# Patient Record
Sex: Female | Born: 1935 | Race: White | Hispanic: No | Smoking: Never smoker
Health system: Southern US, Community
[De-identification: ages and names within clinical notes are randomized; demographics above are authoritative.]

## PROBLEM LIST (undated history)

## (undated) DIAGNOSIS — C9590 Leukemia, unspecified not having achieved remission: Secondary | ICD-10-CM

## (undated) DIAGNOSIS — K5730 Diverticulosis of large intestine without perforation or abscess without bleeding: Secondary | ICD-10-CM

## (undated) DIAGNOSIS — Z860100 Personal history of colon polyps, unspecified: Secondary | ICD-10-CM

## (undated) DIAGNOSIS — J322 Chronic ethmoidal sinusitis: Secondary | ICD-10-CM

## (undated) DIAGNOSIS — D801 Nonfamilial hypogammaglobulinemia: Secondary | ICD-10-CM

## (undated) DIAGNOSIS — G629 Polyneuropathy, unspecified: Secondary | ICD-10-CM

## (undated) DIAGNOSIS — K219 Gastro-esophageal reflux disease without esophagitis: Secondary | ICD-10-CM

## (undated) DIAGNOSIS — T7840XA Allergy, unspecified, initial encounter: Secondary | ICD-10-CM

## (undated) DIAGNOSIS — K5790 Diverticulosis of intestine, part unspecified, without perforation or abscess without bleeding: Secondary | ICD-10-CM

## (undated) DIAGNOSIS — N390 Urinary tract infection, site not specified: Secondary | ICD-10-CM

## (undated) DIAGNOSIS — E785 Hyperlipidemia, unspecified: Secondary | ICD-10-CM

## (undated) DIAGNOSIS — M1990 Unspecified osteoarthritis, unspecified site: Secondary | ICD-10-CM

## (undated) DIAGNOSIS — I10 Essential (primary) hypertension: Secondary | ICD-10-CM

## (undated) DIAGNOSIS — D510 Vitamin B12 deficiency anemia due to intrinsic factor deficiency: Secondary | ICD-10-CM

## (undated) DIAGNOSIS — E538 Deficiency of other specified B group vitamins: Secondary | ICD-10-CM

## (undated) HISTORY — DX: Urinary tract infection, site not specified: N39.0

## (undated) HISTORY — DX: Hyperlipidemia, unspecified: E78.5

## (undated) HISTORY — DX: Personal history of colon polyps, unspecified: Z86.0100

## (undated) HISTORY — DX: Nonfamilial hypogammaglobulinemia: D80.1

## (undated) HISTORY — PX: TUBAL LIGATION: SHX77

## (undated) HISTORY — DX: Essential (primary) hypertension: I10

## (undated) HISTORY — DX: Deficiency of other specified B group vitamins: E53.8

## (undated) HISTORY — DX: Chronic ethmoidal sinusitis: J32.2

## (undated) HISTORY — DX: Polyneuropathy, unspecified: G62.9

## (undated) HISTORY — DX: Diverticulosis of intestine, part unspecified, without perforation or abscess without bleeding: K57.90

## (undated) HISTORY — DX: Diverticulosis of large intestine without perforation or abscess without bleeding: K57.30

## (undated) HISTORY — DX: Unspecified osteoarthritis, unspecified site: M19.90

## (undated) HISTORY — DX: Allergy, unspecified, initial encounter: T78.40XA

## (undated) HISTORY — DX: Personal history of colonic polyps: Z86.010

## (undated) HISTORY — DX: Vitamin B12 deficiency anemia due to intrinsic factor deficiency: D51.0

## (undated) HISTORY — DX: Leukemia, unspecified not having achieved remission: C95.90

## (undated) HISTORY — DX: Gastro-esophageal reflux disease without esophagitis: K21.9

---

## 1959-07-25 HISTORY — PX: OTHER SURGICAL HISTORY: SHX169

## 1985-07-24 HISTORY — PX: OTHER SURGICAL HISTORY: SHX169

## 1985-07-24 HISTORY — PX: ABDOMINAL HYSTERECTOMY: SHX81

## 1990-07-24 HISTORY — PX: OTHER SURGICAL HISTORY: SHX169

## 1997-07-24 HISTORY — PX: OTHER SURGICAL HISTORY: SHX169

## 1997-11-03 ENCOUNTER — Encounter: Admission: RE | Admit: 1997-11-03 | Discharge: 1998-02-01 | Payer: Self-pay | Admitting: Orthopedic Surgery

## 1998-12-01 ENCOUNTER — Ambulatory Visit (HOSPITAL_COMMUNITY): Admission: RE | Admit: 1998-12-01 | Discharge: 1998-12-01 | Payer: Self-pay | Admitting: Neurology

## 1998-12-01 ENCOUNTER — Encounter: Payer: Self-pay | Admitting: Neurology

## 1998-12-31 ENCOUNTER — Encounter: Admission: RE | Admit: 1998-12-31 | Discharge: 1999-02-21 | Payer: Self-pay | Admitting: Neurology

## 1999-02-17 ENCOUNTER — Encounter: Admission: RE | Admit: 1999-02-17 | Discharge: 1999-02-21 | Payer: Self-pay | Admitting: Neurology

## 1999-03-14 ENCOUNTER — Encounter: Admission: RE | Admit: 1999-03-14 | Discharge: 1999-06-12 | Payer: Self-pay | Admitting: Neurology

## 2000-01-10 ENCOUNTER — Encounter (INDEPENDENT_AMBULATORY_CARE_PROVIDER_SITE_OTHER): Payer: Self-pay

## 2000-01-10 ENCOUNTER — Other Ambulatory Visit: Admission: RE | Admit: 2000-01-10 | Discharge: 2000-01-10 | Payer: Self-pay | Admitting: Gastroenterology

## 2000-01-10 DIAGNOSIS — Z860100 Personal history of colon polyps, unspecified: Secondary | ICD-10-CM | POA: Insufficient documentation

## 2000-08-04 ENCOUNTER — Ambulatory Visit (HOSPITAL_COMMUNITY): Admission: RE | Admit: 2000-08-04 | Discharge: 2000-08-04 | Payer: Self-pay | Admitting: Neurology

## 2000-08-04 ENCOUNTER — Encounter: Payer: Self-pay | Admitting: Neurology

## 2000-08-16 ENCOUNTER — Encounter: Admission: RE | Admit: 2000-08-16 | Discharge: 2000-09-07 | Payer: Self-pay | Admitting: Neurology

## 2001-10-02 ENCOUNTER — Encounter: Payer: Self-pay | Admitting: Internal Medicine

## 2001-10-02 ENCOUNTER — Ambulatory Visit (HOSPITAL_COMMUNITY): Admission: RE | Admit: 2001-10-02 | Discharge: 2001-10-02 | Payer: Self-pay | Admitting: Internal Medicine

## 2001-12-27 ENCOUNTER — Encounter: Admission: RE | Admit: 2001-12-27 | Discharge: 2002-02-28 | Payer: Self-pay | Admitting: Neurology

## 2003-05-19 ENCOUNTER — Encounter: Payer: Self-pay | Admitting: Gastroenterology

## 2003-05-19 DIAGNOSIS — K5730 Diverticulosis of large intestine without perforation or abscess without bleeding: Secondary | ICD-10-CM | POA: Insufficient documentation

## 2004-05-31 ENCOUNTER — Ambulatory Visit: Payer: Self-pay | Admitting: Internal Medicine

## 2004-08-01 ENCOUNTER — Ambulatory Visit: Payer: Self-pay | Admitting: Endocrinology

## 2004-08-26 ENCOUNTER — Ambulatory Visit: Payer: Self-pay | Admitting: Internal Medicine

## 2004-09-14 ENCOUNTER — Ambulatory Visit: Payer: Self-pay | Admitting: Internal Medicine

## 2004-09-16 ENCOUNTER — Ambulatory Visit: Payer: Self-pay | Admitting: Internal Medicine

## 2004-09-21 ENCOUNTER — Ambulatory Visit: Payer: Self-pay | Admitting: Internal Medicine

## 2004-09-22 ENCOUNTER — Ambulatory Visit (HOSPITAL_COMMUNITY): Admission: RE | Admit: 2004-09-22 | Discharge: 2004-09-22 | Payer: Self-pay | Admitting: Internal Medicine

## 2004-09-30 ENCOUNTER — Ambulatory Visit: Payer: Self-pay

## 2004-10-24 ENCOUNTER — Ambulatory Visit: Payer: Self-pay | Admitting: Internal Medicine

## 2004-11-23 ENCOUNTER — Ambulatory Visit: Payer: Self-pay | Admitting: Internal Medicine

## 2004-12-21 ENCOUNTER — Ambulatory Visit: Payer: Self-pay | Admitting: Internal Medicine

## 2005-01-25 ENCOUNTER — Ambulatory Visit: Payer: Self-pay | Admitting: Internal Medicine

## 2005-02-09 ENCOUNTER — Ambulatory Visit: Payer: Self-pay | Admitting: Internal Medicine

## 2005-03-15 ENCOUNTER — Ambulatory Visit: Payer: Self-pay | Admitting: Internal Medicine

## 2005-03-24 ENCOUNTER — Other Ambulatory Visit: Admission: RE | Admit: 2005-03-24 | Discharge: 2005-03-24 | Payer: Self-pay | Admitting: Obstetrics and Gynecology

## 2005-04-12 ENCOUNTER — Ambulatory Visit: Payer: Self-pay | Admitting: Internal Medicine

## 2005-05-10 ENCOUNTER — Ambulatory Visit: Payer: Self-pay | Admitting: Internal Medicine

## 2005-05-17 ENCOUNTER — Ambulatory Visit: Payer: Self-pay | Admitting: Internal Medicine

## 2005-06-07 ENCOUNTER — Ambulatory Visit: Payer: Self-pay | Admitting: Internal Medicine

## 2005-07-05 ENCOUNTER — Ambulatory Visit: Payer: Self-pay | Admitting: Internal Medicine

## 2005-07-24 HISTORY — PX: OTHER SURGICAL HISTORY: SHX169

## 2005-08-02 ENCOUNTER — Ambulatory Visit: Payer: Self-pay | Admitting: Internal Medicine

## 2005-08-18 ENCOUNTER — Ambulatory Visit: Payer: Self-pay | Admitting: Internal Medicine

## 2005-09-06 ENCOUNTER — Ambulatory Visit: Payer: Self-pay | Admitting: Internal Medicine

## 2005-09-25 ENCOUNTER — Ambulatory Visit: Payer: Self-pay | Admitting: Internal Medicine

## 2005-10-09 ENCOUNTER — Ambulatory Visit: Payer: Self-pay | Admitting: Internal Medicine

## 2005-10-17 ENCOUNTER — Ambulatory Visit: Payer: Self-pay | Admitting: Internal Medicine

## 2005-11-01 ENCOUNTER — Ambulatory Visit: Payer: Self-pay | Admitting: Internal Medicine

## 2005-11-27 ENCOUNTER — Ambulatory Visit: Payer: Self-pay | Admitting: Internal Medicine

## 2005-12-13 ENCOUNTER — Ambulatory Visit: Payer: Self-pay | Admitting: Internal Medicine

## 2005-12-25 ENCOUNTER — Ambulatory Visit: Payer: Self-pay | Admitting: Internal Medicine

## 2006-01-22 ENCOUNTER — Ambulatory Visit: Payer: Self-pay | Admitting: Internal Medicine

## 2006-03-02 ENCOUNTER — Ambulatory Visit: Payer: Self-pay | Admitting: Internal Medicine

## 2006-03-16 ENCOUNTER — Ambulatory Visit: Payer: Self-pay | Admitting: Internal Medicine

## 2006-04-04 ENCOUNTER — Ambulatory Visit: Payer: Self-pay | Admitting: Internal Medicine

## 2006-04-06 ENCOUNTER — Ambulatory Visit: Payer: Self-pay | Admitting: Internal Medicine

## 2006-04-30 ENCOUNTER — Ambulatory Visit: Payer: Self-pay | Admitting: Internal Medicine

## 2006-07-04 ENCOUNTER — Ambulatory Visit: Payer: Self-pay | Admitting: Internal Medicine

## 2006-07-26 ENCOUNTER — Ambulatory Visit: Payer: Self-pay | Admitting: Internal Medicine

## 2006-08-01 ENCOUNTER — Ambulatory Visit: Payer: Self-pay | Admitting: Internal Medicine

## 2006-08-29 ENCOUNTER — Ambulatory Visit: Payer: Self-pay | Admitting: Internal Medicine

## 2006-08-31 ENCOUNTER — Ambulatory Visit (HOSPITAL_COMMUNITY): Admission: RE | Admit: 2006-08-31 | Discharge: 2006-08-31 | Payer: Self-pay | Admitting: Neurology

## 2006-09-13 ENCOUNTER — Encounter: Admission: RE | Admit: 2006-09-13 | Discharge: 2006-09-13 | Payer: Self-pay | Admitting: Neurology

## 2006-09-26 ENCOUNTER — Ambulatory Visit: Payer: Self-pay | Admitting: Endocrinology

## 2006-10-09 ENCOUNTER — Ambulatory Visit: Payer: Self-pay | Admitting: Internal Medicine

## 2006-10-17 ENCOUNTER — Encounter: Admission: RE | Admit: 2006-10-17 | Discharge: 2006-11-16 | Payer: Self-pay | Admitting: Neurology

## 2006-11-07 ENCOUNTER — Ambulatory Visit: Payer: Self-pay | Admitting: Internal Medicine

## 2006-11-28 ENCOUNTER — Ambulatory Visit: Payer: Self-pay | Admitting: Internal Medicine

## 2006-12-19 ENCOUNTER — Encounter: Payer: Self-pay | Admitting: Endocrinology

## 2006-12-19 DIAGNOSIS — I10 Essential (primary) hypertension: Secondary | ICD-10-CM

## 2006-12-19 DIAGNOSIS — E785 Hyperlipidemia, unspecified: Secondary | ICD-10-CM

## 2006-12-26 ENCOUNTER — Ambulatory Visit: Payer: Self-pay | Admitting: Internal Medicine

## 2007-01-23 ENCOUNTER — Ambulatory Visit: Payer: Self-pay | Admitting: Internal Medicine

## 2007-01-24 ENCOUNTER — Ambulatory Visit: Payer: Self-pay | Admitting: Internal Medicine

## 2007-01-24 LAB — CONVERTED CEMR LAB
ALT: 18 units/L (ref 0–35)
AST: 23 units/L (ref 0–37)
BUN: 7 mg/dL (ref 6–23)
Basophils Relative: 0 % (ref 0.0–1.0)
Bilirubin, Direct: 0.1 mg/dL (ref 0.0–0.3)
Calcium: 9.8 mg/dL (ref 8.4–10.5)
Chloride: 103 meq/L (ref 96–112)
Cholesterol: 159 mg/dL (ref 0–200)
Eosinophils Absolute: 0.1 10*3/uL (ref 0.0–0.6)
GFR calc non Af Amer: 74 mL/min
Glucose, Bld: 107 mg/dL — ABNORMAL HIGH (ref 70–99)
HCT: 43 % (ref 36.0–46.0)
HDL: 60.7 mg/dL (ref >39.0)
Hemoglobin, Urine: NEGATIVE
Lymphocytes Relative: 61.7 % — ABNORMAL HIGH (ref 12.0–46.0)
Monocytes Absolute: 0.3 10*3/uL (ref 0.2–0.7)
Monocytes Relative: 4.8 % (ref 3.0–11.0)
Neutro Abs: 2.2 10*3/uL (ref 1.4–7.7)
Neutrophils Relative %: 32.3 % — ABNORMAL LOW (ref 43.0–77.0)
Nitrite: NEGATIVE
Platelets: 141 10*3/uL — ABNORMAL LOW (ref 150–400)
Potassium: 3.2 meq/L — ABNORMAL LOW (ref 3.5–5.1)
RBC: 4.64 M/uL (ref 3.87–5.11)
Sed Rate: 9 mm/hr (ref 0–25)
TSH: 4.07 microintl units/mL (ref 0.35–5.50)
Total Bilirubin: 1 mg/dL (ref 0.3–1.2)
Triglycerides: 69 mg/dL (ref 0–149)
Urobilinogen, UA: 0.2 (ref 0.0–1.0)
VLDL: 14 mg/dL (ref 0–40)
WBC: 6.8 10*3/uL (ref 4.5–10.5)
pH: 7.5 (ref 5.0–8.0)

## 2007-02-21 ENCOUNTER — Ambulatory Visit: Payer: Self-pay | Admitting: Internal Medicine

## 2007-02-22 ENCOUNTER — Encounter: Admission: RE | Admit: 2007-02-22 | Discharge: 2007-05-23 | Payer: Self-pay | Admitting: Orthopedic Surgery

## 2007-03-03 ENCOUNTER — Emergency Department (HOSPITAL_COMMUNITY): Admission: EM | Admit: 2007-03-03 | Discharge: 2007-03-03 | Payer: Self-pay | Admitting: Emergency Medicine

## 2007-03-05 ENCOUNTER — Ambulatory Visit: Payer: Self-pay | Admitting: Endocrinology

## 2007-03-14 ENCOUNTER — Ambulatory Visit: Payer: Self-pay | Admitting: Internal Medicine

## 2007-03-14 LAB — CONVERTED CEMR LAB
CO2: 33 meq/L — ABNORMAL HIGH (ref 19–32)
Chloride: 96 meq/L (ref 96–112)
Creatinine, Ser: 0.8 mg/dL (ref 0.4–1.2)
Eosinophils Absolute: 0.1 10*3/uL (ref 0.0–0.6)
GFR calc Af Amer: 89 mL/min
GFR calc non Af Amer: 74 mL/min
HCT: 42.6 % (ref 36.0–46.0)
Lymphocytes Relative: 63.7 % — ABNORMAL HIGH (ref 12.0–46.0)
MCV: 91.8 fL (ref 78.0–100.0)
Monocytes Relative: 4.1 % (ref 3.0–11.0)
Neutrophils Relative %: 30.8 % — ABNORMAL LOW (ref 43.0–77.0)
Potassium: 3.8 meq/L (ref 3.5–5.1)
RDW: 12.6 % (ref 11.5–14.6)
Sodium: 137 meq/L (ref 135–145)
WBC: 11.3 10*3/uL — ABNORMAL HIGH (ref 4.5–10.5)

## 2007-03-23 ENCOUNTER — Ambulatory Visit (HOSPITAL_COMMUNITY): Admission: RE | Admit: 2007-03-23 | Discharge: 2007-03-23 | Payer: Self-pay | Admitting: Family Medicine

## 2007-03-23 ENCOUNTER — Ambulatory Visit: Payer: Self-pay | Admitting: Family Medicine

## 2007-04-04 ENCOUNTER — Ambulatory Visit: Payer: Self-pay | Admitting: Gastroenterology

## 2007-04-15 ENCOUNTER — Ambulatory Visit: Payer: Self-pay | Admitting: Internal Medicine

## 2007-04-15 LAB — CONVERTED CEMR LAB
Basophils Absolute: 0 10*3/uL (ref 0.0–0.1)
Bilirubin Urine: NEGATIVE
Calcium: 9.9 mg/dL (ref 8.4–10.5)
Chloride: 100 meq/L (ref 96–112)
Creatinine, Ser: 0.8 mg/dL (ref 0.4–1.2)
Eosinophils Absolute: 0.1 10*3/uL (ref 0.0–0.6)
Hemoglobin: 14.9 g/dL (ref 12.0–15.0)
MCV: 92.6 fL (ref 78.0–100.0)
Monocytes Absolute: 0.4 10*3/uL (ref 0.2–0.7)
Monocytes Relative: 4.6 % (ref 3.0–11.0)
Platelets: 183 10*3/uL (ref 150–400)
RBC: 4.73 M/uL (ref 3.87–5.11)
Specific Gravity, Urine: 1.01 (ref 1.000–1.03)
pH: 7.5 (ref 5.0–8.0)

## 2007-04-16 ENCOUNTER — Ambulatory Visit: Payer: Self-pay | Admitting: Hematology & Oncology

## 2007-04-19 ENCOUNTER — Ambulatory Visit: Payer: Self-pay | Admitting: Cardiovascular Disease

## 2007-04-22 ENCOUNTER — Ambulatory Visit: Payer: Self-pay | Admitting: Gastroenterology

## 2007-04-30 ENCOUNTER — Ambulatory Visit: Payer: Self-pay | Admitting: Gastroenterology

## 2007-04-30 DIAGNOSIS — K2970 Gastritis, unspecified, without bleeding: Secondary | ICD-10-CM | POA: Insufficient documentation

## 2007-04-30 DIAGNOSIS — K2990 Gastroduodenitis, unspecified, without bleeding: Secondary | ICD-10-CM

## 2007-04-30 DIAGNOSIS — K219 Gastro-esophageal reflux disease without esophagitis: Secondary | ICD-10-CM

## 2007-05-10 ENCOUNTER — Ambulatory Visit: Payer: Self-pay | Admitting: Internal Medicine

## 2007-05-27 ENCOUNTER — Other Ambulatory Visit: Admission: RE | Admit: 2007-05-27 | Discharge: 2007-05-27 | Payer: Self-pay | Admitting: Hematology & Oncology

## 2007-05-27 ENCOUNTER — Encounter: Payer: Self-pay | Admitting: Hematology & Oncology

## 2007-05-27 ENCOUNTER — Encounter: Payer: Self-pay | Admitting: Internal Medicine

## 2007-05-27 LAB — CBC WITH DIFFERENTIAL/PLATELET
BASO%: 0.4 % (ref 0.0–2.0)
EOS%: 1.7 % (ref 0.0–7.0)
HCT: 39.5 % (ref 34.8–46.6)
LYMPH%: 38.7 % (ref 14.0–48.0)
MCH: 31.8 pg (ref 26.0–34.0)
MCHC: 34.8 g/dL (ref 32.0–36.0)
MCV: 91.4 fL (ref 81.0–101.0)
NEUT%: 52.6 % (ref 39.6–76.8)
Platelets: 143 10*3/uL — ABNORMAL LOW (ref 145–400)

## 2007-05-29 LAB — PROTEIN ELECTROPHORESIS, SERUM
Albumin ELP: 59.1 % (ref 55.8–66.1)
Alpha-1-Globulin: 4.9 % (ref 2.9–4.9)
Alpha-2-Globulin: 15.1 % — ABNORMAL HIGH (ref 7.1–11.8)
Total Protein, Serum Electrophoresis: 7 g/dL (ref 6.0–8.3)

## 2007-05-29 LAB — IGG, IGA, IGM: IgM, Serum: 65 mg/dL (ref 60–263)

## 2007-06-07 ENCOUNTER — Ambulatory Visit: Payer: Self-pay | Admitting: Internal Medicine

## 2007-06-07 DIAGNOSIS — E538 Deficiency of other specified B group vitamins: Secondary | ICD-10-CM

## 2007-06-12 ENCOUNTER — Ambulatory Visit: Payer: Self-pay | Admitting: Hematology & Oncology

## 2007-06-17 ENCOUNTER — Ambulatory Visit: Payer: Self-pay | Admitting: Internal Medicine

## 2007-06-17 DIAGNOSIS — J309 Allergic rhinitis, unspecified: Secondary | ICD-10-CM | POA: Insufficient documentation

## 2007-06-17 DIAGNOSIS — C9110 Chronic lymphocytic leukemia of B-cell type not having achieved remission: Secondary | ICD-10-CM | POA: Insufficient documentation

## 2007-06-17 DIAGNOSIS — M1990 Unspecified osteoarthritis, unspecified site: Secondary | ICD-10-CM | POA: Insufficient documentation

## 2007-07-10 ENCOUNTER — Encounter: Payer: Self-pay | Admitting: Internal Medicine

## 2007-07-10 LAB — CBC WITH DIFFERENTIAL/PLATELET
Basophils Absolute: 0 10*3/uL (ref 0.0–0.1)
Eosinophils Absolute: 0.1 10*3/uL (ref 0.0–0.5)
HGB: 13.9 g/dL (ref 11.6–15.9)
MONO#: 0.4 10*3/uL (ref 0.1–0.9)
NEUT#: 2.3 10*3/uL (ref 1.5–6.5)
Platelets: 146 10*3/uL (ref 145–400)
RBC: 4.41 10*6/uL (ref 3.70–5.32)
RDW: 13.1 % (ref 11.3–14.5)
WBC: 6.8 10*3/uL (ref 3.9–10.0)

## 2007-07-10 LAB — IGG, IGA, IGM
IgG (Immunoglobin G), Serum: 997 mg/dL (ref 694–1618)
IgM, Serum: 61 mg/dL (ref 60–263)

## 2007-07-10 LAB — CHCC SMEAR

## 2007-07-12 ENCOUNTER — Ambulatory Visit: Payer: Self-pay | Admitting: Internal Medicine

## 2007-07-29 ENCOUNTER — Ambulatory Visit: Payer: Self-pay | Admitting: Internal Medicine

## 2007-07-29 ENCOUNTER — Ambulatory Visit: Payer: Self-pay | Admitting: Hematology & Oncology

## 2007-07-29 DIAGNOSIS — J019 Acute sinusitis, unspecified: Secondary | ICD-10-CM

## 2007-08-06 ENCOUNTER — Ambulatory Visit: Payer: Self-pay | Admitting: Internal Medicine

## 2007-08-06 DIAGNOSIS — R519 Headache, unspecified: Secondary | ICD-10-CM | POA: Insufficient documentation

## 2007-08-06 DIAGNOSIS — R51 Headache: Secondary | ICD-10-CM

## 2007-08-06 DIAGNOSIS — R5382 Chronic fatigue, unspecified: Secondary | ICD-10-CM

## 2007-08-08 ENCOUNTER — Encounter: Payer: Self-pay | Admitting: Internal Medicine

## 2007-08-14 ENCOUNTER — Encounter: Payer: Self-pay | Admitting: Internal Medicine

## 2007-09-04 ENCOUNTER — Ambulatory Visit: Payer: Self-pay | Admitting: Internal Medicine

## 2007-09-04 DIAGNOSIS — J0120 Acute ethmoidal sinusitis, unspecified: Secondary | ICD-10-CM

## 2007-09-04 DIAGNOSIS — G629 Polyneuropathy, unspecified: Secondary | ICD-10-CM

## 2007-09-11 ENCOUNTER — Encounter: Payer: Self-pay | Admitting: Internal Medicine

## 2007-09-11 LAB — CBC WITH DIFFERENTIAL/PLATELET
BASO%: 0.3 % (ref 0.0–2.0)
EOS%: 2.3 % (ref 0.0–7.0)
LYMPH%: 54 % — ABNORMAL HIGH (ref 14.0–48.0)
MCHC: 35.8 g/dL (ref 32.0–36.0)
MCV: 90.1 fL (ref 81.0–101.0)
MONO#: 0.3 10*3/uL (ref 0.1–0.9)
MONO%: 2.9 % (ref 0.0–13.0)
Platelets: 198 10*3/uL (ref 145–400)
RBC: 4.38 10*6/uL (ref 3.70–5.32)
WBC: 10.8 10*3/uL — ABNORMAL HIGH (ref 3.9–10.0)

## 2007-09-11 LAB — CHCC SMEAR

## 2007-10-02 ENCOUNTER — Ambulatory Visit: Payer: Self-pay | Admitting: Internal Medicine

## 2007-10-04 ENCOUNTER — Encounter: Payer: Self-pay | Admitting: Internal Medicine

## 2007-10-07 ENCOUNTER — Ambulatory Visit: Payer: Self-pay | Admitting: Internal Medicine

## 2007-10-07 LAB — CONVERTED CEMR LAB
CO2: 34 meq/L — ABNORMAL HIGH (ref 19–32)
Chloride: 99 meq/L (ref 96–112)
Cholesterol: 172 mg/dL (ref 0–200)
Creatinine, Ser: 0.8 mg/dL (ref 0.4–1.2)
GFR calc non Af Amer: 74 mL/min
HDL: 51.9 mg/dL (ref >39.0)
LDL Cholesterol: 104 mg/dL — ABNORMAL HIGH (ref 0–99)
Potassium: 3.8 meq/L (ref 3.5–5.1)
Sodium: 139 meq/L (ref 135–145)
VLDL: 16 mg/dL (ref 0–40)

## 2007-10-08 ENCOUNTER — Encounter: Payer: Self-pay | Admitting: Internal Medicine

## 2007-10-10 DIAGNOSIS — Z862 Personal history of diseases of the blood and blood-forming organs and certain disorders involving the immune mechanism: Secondary | ICD-10-CM | POA: Insufficient documentation

## 2007-10-10 DIAGNOSIS — M722 Plantar fascial fibromatosis: Secondary | ICD-10-CM

## 2007-10-14 ENCOUNTER — Ambulatory Visit: Payer: Self-pay | Admitting: Internal Medicine

## 2007-10-17 LAB — CONVERTED CEMR LAB
Bilirubin Urine: NEGATIVE
Ketones, ur: NEGATIVE mg/dL
Leukocytes, UA: NEGATIVE
Nitrite: NEGATIVE
Urine Glucose: NEGATIVE mg/dL
Urobilinogen, UA: 0.2 (ref 0.0–1.0)

## 2007-10-21 ENCOUNTER — Ambulatory Visit: Payer: Self-pay | Admitting: Hematology & Oncology

## 2007-10-23 ENCOUNTER — Encounter: Payer: Self-pay | Admitting: Internal Medicine

## 2007-10-23 LAB — CBC WITH DIFFERENTIAL/PLATELET
Eosinophils Absolute: 0.1 10*3/uL (ref 0.0–0.5)
MCV: 90.1 fL (ref 81.0–101.0)
MONO%: 6 % (ref 0.0–13.0)
NEUT#: 2.5 10*3/uL (ref 1.5–6.5)
RBC: 4.58 10*6/uL (ref 3.70–5.32)
RDW: 12.3 % (ref 11.3–14.5)
WBC: 7.8 10*3/uL (ref 3.9–10.0)

## 2007-10-23 LAB — CHCC SMEAR

## 2007-10-24 LAB — IGG, IGA, IGM: IgG (Immunoglobin G), Serum: 976 mg/dL (ref 694–1618)

## 2007-11-11 ENCOUNTER — Telehealth: Payer: Self-pay | Admitting: Internal Medicine

## 2007-11-11 ENCOUNTER — Ambulatory Visit: Payer: Self-pay | Admitting: Internal Medicine

## 2008-01-07 ENCOUNTER — Ambulatory Visit: Payer: Self-pay | Admitting: Hematology & Oncology

## 2008-01-09 LAB — IGG, IGA, IGM
IgA: 291 mg/dL (ref 68–378)
IgG (Immunoglobin G), Serum: 1070 mg/dL (ref 694–1618)
IgM, Serum: 64 mg/dL (ref 60–263)

## 2008-01-09 LAB — CBC WITH DIFFERENTIAL/PLATELET
Basophils Absolute: 0 10*3/uL (ref 0.0–0.1)
EOS%: 1.2 % (ref 0.0–7.0)
Eosinophils Absolute: 0.1 10*3/uL (ref 0.0–0.5)
HGB: 14.1 g/dL (ref 11.6–15.9)
MONO#: 0.4 10*3/uL (ref 0.1–0.9)
NEUT#: 3.5 10*3/uL (ref 1.5–6.5)
RDW: 13.6 % (ref 11.3–14.5)
lymph#: 4.5 10*3/uL — ABNORMAL HIGH (ref 0.9–3.3)

## 2008-01-21 ENCOUNTER — Telehealth: Payer: Self-pay | Admitting: Internal Medicine

## 2008-03-04 ENCOUNTER — Telehealth: Payer: Self-pay | Admitting: Internal Medicine

## 2008-03-04 ENCOUNTER — Ambulatory Visit: Payer: Self-pay | Admitting: Hematology & Oncology

## 2008-04-01 ENCOUNTER — Encounter: Payer: Self-pay | Admitting: Internal Medicine

## 2008-04-09 LAB — IGG, IGA, IGM
IgA: 294 mg/dL (ref 68–378)
IgM, Serum: 59 mg/dL — ABNORMAL LOW (ref 60–263)

## 2008-04-09 LAB — CBC & DIFF AND RETIC
BASO%: 0.7 % (ref 0.0–2.0)
Basophils Absolute: 0.1 10*3/uL (ref 0.0–0.1)
EOS%: 1.2 % (ref 0.0–7.0)
HCT: 43.1 % (ref 34.8–46.6)
HGB: 14.6 g/dL (ref 11.6–15.9)
MCH: 31.4 pg (ref 26.0–34.0)
MCHC: 33.9 g/dL (ref 32.0–36.0)
MCV: 92.5 fL (ref 81.0–101.0)
MONO%: 5.5 % (ref 0.0–13.0)
NEUT%: 41.8 % (ref 39.6–76.8)
RDW: 13.4 % (ref 11.3–14.5)

## 2008-04-20 ENCOUNTER — Encounter: Payer: Self-pay | Admitting: Internal Medicine

## 2008-04-21 ENCOUNTER — Ambulatory Visit: Payer: Self-pay | Admitting: Internal Medicine

## 2008-04-21 DIAGNOSIS — N3090 Cystitis, unspecified without hematuria: Secondary | ICD-10-CM

## 2008-04-21 LAB — CONVERTED CEMR LAB
Mucus, UA: NEGATIVE
Nitrite: NEGATIVE
Specific Gravity, Urine: <=1.005 (ref 1.000–1.03)
Total Protein, Urine: NEGATIVE mg/dL
Urine Glucose: NEGATIVE mg/dL
Urobilinogen, UA: 0.2 (ref 0.0–1.0)

## 2008-04-24 ENCOUNTER — Encounter: Payer: Self-pay | Admitting: Internal Medicine

## 2008-05-18 ENCOUNTER — Ambulatory Visit: Payer: Self-pay | Admitting: Internal Medicine

## 2008-05-18 DIAGNOSIS — E876 Hypokalemia: Secondary | ICD-10-CM

## 2008-05-18 LAB — CONVERTED CEMR LAB
Calcium: 9.4 mg/dL (ref 8.4–10.5)
Chloride: 101 meq/L (ref 96–112)
GFR calc non Af Amer: 64 mL/min
Glucose, Bld: 105 mg/dL — ABNORMAL HIGH (ref 70–99)
Ketones, ur: NEGATIVE mg/dL
Mucus, UA: NEGATIVE
Nitrite: NEGATIVE
Potassium: 3.8 meq/L (ref 3.5–5.1)
Sodium: 142 meq/L (ref 135–145)
pH: 6 (ref 5.0–8.0)

## 2008-05-19 ENCOUNTER — Encounter: Payer: Self-pay | Admitting: Internal Medicine

## 2008-05-19 ENCOUNTER — Ambulatory Visit: Payer: Self-pay | Admitting: Hematology & Oncology

## 2008-05-20 ENCOUNTER — Encounter: Payer: Self-pay | Admitting: Internal Medicine

## 2008-05-25 ENCOUNTER — Encounter: Payer: Self-pay | Admitting: Internal Medicine

## 2008-07-07 ENCOUNTER — Ambulatory Visit: Payer: Self-pay | Admitting: Hematology & Oncology

## 2008-07-07 ENCOUNTER — Ambulatory Visit: Payer: Self-pay | Admitting: Endocrinology

## 2008-07-07 LAB — CONVERTED CEMR LAB
Glucose, Urine, Semiquant: NEGATIVE
Protein, U semiquant: NEGATIVE
Specific Gravity, Urine: <1.005
pH: 5

## 2008-07-08 ENCOUNTER — Encounter: Payer: Self-pay | Admitting: Endocrinology

## 2008-07-09 ENCOUNTER — Encounter: Payer: Self-pay | Admitting: Internal Medicine

## 2008-07-09 LAB — URINALYSIS, MICROSCOPIC - CHCC
Bilirubin (Urine): NEGATIVE
Leukocyte Esterase: NEGATIVE
RBC count: NEGATIVE (ref 0–2)
pH: 5 (ref 4.6–8.0)

## 2008-07-24 DIAGNOSIS — N390 Urinary tract infection, site not specified: Secondary | ICD-10-CM

## 2008-07-24 HISTORY — DX: Urinary tract infection, site not specified: N39.0

## 2008-09-01 ENCOUNTER — Ambulatory Visit: Payer: Self-pay | Admitting: Hematology & Oncology

## 2008-09-23 ENCOUNTER — Encounter: Payer: Self-pay | Admitting: Internal Medicine

## 2008-10-01 LAB — CBC WITH DIFFERENTIAL/PLATELET
Basophils Absolute: 0 10*3/uL (ref 0.0–0.1)
EOS%: 2.4 % (ref 0.0–7.0)
Eosinophils Absolute: 0.2 10*3/uL (ref 0.0–0.5)
HCT: 41.1 % (ref 34.8–46.6)
HGB: 14 g/dL (ref 11.6–15.9)
MCH: 30.1 pg (ref 25.1–34.0)
MONO#: 0.4 10*3/uL (ref 0.1–0.9)
NEUT#: 2.5 10*3/uL (ref 1.5–6.5)
NEUT%: 37 % — ABNORMAL LOW (ref 38.4–76.8)
RDW: 14 % (ref 11.2–14.5)
WBC: 6.8 10*3/uL (ref 3.9–10.3)
lymph#: 3.7 10*3/uL — ABNORMAL HIGH (ref 0.9–3.3)

## 2008-10-01 LAB — IGG, IGA, IGM
IgA: 273 mg/dL (ref 68–378)
IgG (Immunoglobin G), Serum: 1250 mg/dL (ref 694–1618)
IgM, Serum: 69 mg/dL (ref 60–263)

## 2008-10-07 ENCOUNTER — Ambulatory Visit: Payer: Self-pay | Admitting: Endocrinology

## 2008-10-07 ENCOUNTER — Inpatient Hospital Stay (HOSPITAL_COMMUNITY): Admission: EM | Admit: 2008-10-07 | Discharge: 2008-10-08 | Payer: Self-pay | Admitting: Emergency Medicine

## 2008-10-07 ENCOUNTER — Encounter (INDEPENDENT_AMBULATORY_CARE_PROVIDER_SITE_OTHER): Payer: Self-pay | Admitting: Internal Medicine

## 2008-10-08 ENCOUNTER — Encounter: Payer: Self-pay | Admitting: Endocrinology

## 2008-10-08 ENCOUNTER — Encounter (INDEPENDENT_AMBULATORY_CARE_PROVIDER_SITE_OTHER): Payer: Self-pay | Admitting: Internal Medicine

## 2008-10-27 ENCOUNTER — Ambulatory Visit: Payer: Self-pay | Admitting: Oncology

## 2008-10-30 ENCOUNTER — Ambulatory Visit: Payer: Self-pay | Admitting: Internal Medicine

## 2008-11-04 ENCOUNTER — Encounter: Payer: Self-pay | Admitting: Internal Medicine

## 2008-11-25 ENCOUNTER — Telehealth: Payer: Self-pay | Admitting: Internal Medicine

## 2008-12-29 ENCOUNTER — Ambulatory Visit: Payer: Self-pay | Admitting: Oncology

## 2009-01-12 ENCOUNTER — Ambulatory Visit: Payer: Self-pay | Admitting: Internal Medicine

## 2009-01-12 LAB — CONVERTED CEMR LAB
ALT: 20 units/L (ref 0–35)
Albumin: 3.9 g/dL (ref 3.5–5.2)
Alkaline Phosphatase: 42 units/L (ref 39–117)
Bilirubin, Direct: 0.2 mg/dL (ref 0.0–0.3)
CO2: 29 meq/L (ref 19–32)
Glucose, Bld: 105 mg/dL — ABNORMAL HIGH (ref 70–99)
HDL: 50.8 mg/dL (ref >39.00)
LDL Cholesterol: 100 mg/dL — ABNORMAL HIGH (ref 0–99)
Sodium: 141 meq/L (ref 135–145)
TSH: 2.63 microintl units/mL (ref 0.35–5.50)
Total Bilirubin: 1 mg/dL (ref 0.3–1.2)
Total CHOL/HDL Ratio: 3
Vit D, 25-Hydroxy: 37 ng/mL (ref 30–89)

## 2009-01-18 ENCOUNTER — Ambulatory Visit: Payer: Self-pay | Admitting: Internal Medicine

## 2009-01-28 ENCOUNTER — Ambulatory Visit: Payer: Self-pay | Admitting: Oncology

## 2009-01-28 ENCOUNTER — Encounter: Payer: Self-pay | Admitting: Internal Medicine

## 2009-01-28 LAB — CBC WITH DIFFERENTIAL/PLATELET
BASO%: 0.2 % (ref 0.0–2.0)
Eosinophils Absolute: 0.1 10*3/uL (ref 0.0–0.5)
MCHC: 34.7 g/dL (ref 31.5–36.0)
MONO#: 0.5 10*3/uL (ref 0.1–0.9)
NEUT#: 3 10*3/uL (ref 1.5–6.5)
Platelets: 154 10*3/uL (ref 145–400)
RBC: 4.65 10*6/uL (ref 3.70–5.45)
RDW: 13.5 % (ref 11.2–14.5)
WBC: 8.4 10*3/uL (ref 3.9–10.3)
lymph#: 4.7 10*3/uL — ABNORMAL HIGH (ref 0.9–3.3)
nRBC: 0 % (ref 0–0)

## 2009-03-01 ENCOUNTER — Ambulatory Visit: Payer: Self-pay | Admitting: Oncology

## 2009-03-01 ENCOUNTER — Encounter: Payer: Self-pay | Admitting: Internal Medicine

## 2009-03-17 ENCOUNTER — Encounter: Payer: Self-pay | Admitting: Internal Medicine

## 2009-03-30 ENCOUNTER — Ambulatory Visit: Payer: Self-pay | Admitting: Internal Medicine

## 2009-03-31 LAB — CONVERTED CEMR LAB
Bilirubin Urine: NEGATIVE
Nitrite: NEGATIVE
Specific Gravity, Urine: <=1.005 (ref 1.000–1.030)

## 2009-04-02 ENCOUNTER — Telehealth: Payer: Self-pay | Admitting: Internal Medicine

## 2009-04-05 ENCOUNTER — Ambulatory Visit: Payer: Self-pay | Admitting: Oncology

## 2009-04-19 ENCOUNTER — Ambulatory Visit: Payer: Self-pay | Admitting: Internal Medicine

## 2009-04-19 LAB — CONVERTED CEMR LAB
Bilirubin Urine: NEGATIVE
Hemoglobin, Urine: NEGATIVE
Ketones, ur: NEGATIVE mg/dL
Leukocytes, UA: NEGATIVE
Nitrite: NEGATIVE
Specific Gravity, Urine: <=1.005 (ref 1.000–1.030)
Urobilinogen, UA: 0.2 (ref 0.0–1.0)

## 2009-05-05 ENCOUNTER — Ambulatory Visit: Payer: Self-pay | Admitting: Oncology

## 2009-06-03 ENCOUNTER — Ambulatory Visit: Payer: Self-pay | Admitting: Oncology

## 2009-06-03 ENCOUNTER — Encounter: Payer: Self-pay | Admitting: Internal Medicine

## 2009-06-03 LAB — CBC WITH DIFFERENTIAL/PLATELET
BASO%: 0.3 % (ref 0.0–2.0)
Eosinophils Absolute: 0.1 10*3/uL (ref 0.0–0.5)
HCT: 40.6 % (ref 34.8–46.6)
LYMPH%: 69.1 % — ABNORMAL HIGH (ref 14.0–49.7)
MCHC: 34.5 g/dL (ref 31.5–36.0)
MONO#: 0.4 10*3/uL (ref 0.1–0.9)
NEUT#: 1.7 10*3/uL (ref 1.5–6.5)
NEUT%: 23.8 % — ABNORMAL LOW (ref 38.4–76.8)
Platelets: 138 10*3/uL — ABNORMAL LOW (ref 145–400)
RBC: 4.54 10*6/uL (ref 3.70–5.45)
WBC: 7.3 10*3/uL (ref 3.9–10.3)
lymph#: 5.1 10*3/uL — ABNORMAL HIGH (ref 0.9–3.3)

## 2009-06-03 LAB — IGG, IGA, IGM
IgG (Immunoglobin G), Serum: 1140 mg/dL (ref 694–1618)
IgM, Serum: 55 mg/dL — ABNORMAL LOW (ref 60–263)

## 2009-07-14 ENCOUNTER — Ambulatory Visit: Payer: Self-pay | Admitting: Internal Medicine

## 2009-07-16 LAB — CONVERTED CEMR LAB
ALT: 19 units/L (ref 0–35)
AST: 22 units/L (ref 0–37)
Albumin: 3.9 g/dL (ref 3.5–5.2)
Alkaline Phosphatase: 47 units/L (ref 39–117)
BUN: 12 mg/dL (ref 6–23)
CO2: 33 meq/L — ABNORMAL HIGH (ref 19–32)
Calcium: 9.6 mg/dL (ref 8.4–10.5)
Chloride: 99 meq/L (ref 96–112)
Cholesterol: 182 mg/dL (ref 0–200)
Creatinine, Ser: 0.9 mg/dL (ref 0.4–1.2)
Eosinophils Absolute: 0.1 10*3/uL (ref 0.0–0.7)
GFR calc non Af Amer: 63.87 mL/min (ref >60)
HDL: 52.7 mg/dL (ref >39.00)
LDL Cholesterol: 105 mg/dL — ABNORMAL HIGH (ref 0–99)
Lymphs Abs: 3.9 10*3/uL (ref 0.7–4.0)
Microalb Creat Ratio: 4.4 mg/g (ref 0.0–30.0)
Neutro Abs: 1.7 10*3/uL (ref 1.4–7.7)
RBC: 4.44 M/uL (ref 3.87–5.11)
Sodium: 138 meq/L (ref 135–145)
Total Bilirubin: 0.9 mg/dL (ref 0.3–1.2)
Total Protein: 8 g/dL (ref 6.0–8.3)
Triglycerides: 123 mg/dL (ref 0.0–149.0)
VLDL: 24.6 mg/dL (ref 0.0–40.0)

## 2009-07-21 ENCOUNTER — Ambulatory Visit: Payer: Self-pay | Admitting: Internal Medicine

## 2009-07-26 ENCOUNTER — Ambulatory Visit: Payer: Self-pay | Admitting: Oncology

## 2009-08-25 ENCOUNTER — Ambulatory Visit: Payer: Self-pay | Admitting: Oncology

## 2009-09-02 ENCOUNTER — Encounter: Payer: Self-pay | Admitting: Internal Medicine

## 2009-09-23 ENCOUNTER — Encounter: Payer: Self-pay | Admitting: Internal Medicine

## 2009-09-23 LAB — CBC WITH DIFFERENTIAL/PLATELET
Basophils Absolute: 0 10*3/uL (ref 0.0–0.1)
EOS%: 2.8 % (ref 0.0–7.0)
HCT: 41.9 % (ref 34.8–46.6)
HGB: 14.2 g/dL (ref 11.6–15.9)
MCH: 30.7 pg (ref 25.1–34.0)
MONO#: 0.4 10*3/uL (ref 0.1–0.9)
NEUT#: 2.2 10*3/uL (ref 1.5–6.5)
RDW: 13.9 % (ref 11.2–14.5)
WBC: 6.4 10*3/uL (ref 3.9–10.3)
lymph#: 3.6 10*3/uL — ABNORMAL HIGH (ref 0.9–3.3)

## 2009-10-13 ENCOUNTER — Ambulatory Visit: Payer: Self-pay | Admitting: Internal Medicine

## 2009-10-13 LAB — CONVERTED CEMR LAB
BUN: 13 mg/dL (ref 6–23)
CO2: 32 meq/L (ref 19–32)
Creatinine, Ser: 1 mg/dL (ref 0.4–1.2)
GFR calc non Af Amer: 56.53 mL/min (ref >60)
Glucose, Bld: 95 mg/dL (ref 70–99)

## 2009-10-19 ENCOUNTER — Ambulatory Visit: Payer: Self-pay | Admitting: Oncology

## 2009-10-20 ENCOUNTER — Ambulatory Visit: Payer: Self-pay | Admitting: Internal Medicine

## 2009-11-08 ENCOUNTER — Telehealth: Payer: Self-pay | Admitting: Internal Medicine

## 2009-11-09 ENCOUNTER — Ambulatory Visit: Payer: Self-pay | Admitting: Internal Medicine

## 2009-11-10 LAB — CONVERTED CEMR LAB
Specific Gravity, Urine: 1.015 (ref 1.000–1.030)
Total Protein, Urine: NEGATIVE mg/dL

## 2009-11-18 ENCOUNTER — Ambulatory Visit: Payer: Self-pay | Admitting: Oncology

## 2010-01-11 ENCOUNTER — Ambulatory Visit: Payer: Self-pay | Admitting: Oncology

## 2010-01-13 ENCOUNTER — Encounter: Payer: Self-pay | Admitting: Internal Medicine

## 2010-01-13 LAB — URINALYSIS, MICROSCOPIC - CHCC
Bilirubin (Urine): NEGATIVE
Glucose: NEGATIVE g/dL
Ketones: NEGATIVE mg/dL
Protein: NEGATIVE mg/dL
RBC count: NEGATIVE (ref 0–2)
Specific Gravity, Urine: 1.01 (ref 1.003–1.035)
WBC, UA: NEGATIVE (ref 0–2)

## 2010-01-13 LAB — CBC WITH DIFFERENTIAL/PLATELET
BASO%: 0.3 % (ref 0.0–2.0)
Eosinophils Absolute: 0.1 10*3/uL (ref 0.0–0.5)
MCHC: 34.2 g/dL (ref 31.5–36.0)
MONO#: 0.4 10*3/uL (ref 0.1–0.9)
NEUT#: 2.6 10*3/uL (ref 1.5–6.5)
Platelets: 157 10*3/uL (ref 145–400)
RBC: 4.49 10*6/uL (ref 3.70–5.45)
RDW: 13.6 % (ref 11.2–14.5)
WBC: 7 10*3/uL (ref 3.9–10.3)
lymph#: 3.9 10*3/uL — ABNORMAL HIGH (ref 0.9–3.3)

## 2010-01-14 LAB — URINE CULTURE

## 2010-02-16 ENCOUNTER — Ambulatory Visit: Payer: Self-pay | Admitting: Internal Medicine

## 2010-02-17 LAB — CONVERTED CEMR LAB
ALT: 20 units/L (ref 0–35)
AST: 25 units/L (ref 0–37)
Alkaline Phosphatase: 48 units/L (ref 39–117)
Bilirubin, Direct: 0.1 mg/dL (ref 0.0–0.3)
CO2: 29 meq/L (ref 19–32)
Creatinine, Ser: 0.8 mg/dL (ref 0.4–1.2)
Eosinophils Absolute: 0.1 10*3/uL (ref 0.0–0.7)
Eosinophils Relative: 2 % (ref 0.0–5.0)
GFR calc non Af Amer: 71.01 mL/min (ref >60)
HDL: 51.8 mg/dL (ref >39.00)
LDL Cholesterol: 101 mg/dL — ABNORMAL HIGH (ref 0–99)
Lymphocytes Relative: 53.4 % — ABNORMAL HIGH (ref 12.0–46.0)
Lymphs Abs: 3.2 10*3/uL (ref 0.7–4.0)
MCHC: 34.2 g/dL (ref 30.0–36.0)
Monocytes Relative: 5.2 % (ref 3.0–12.0)
Neutro Abs: 2.3 10*3/uL (ref 1.4–7.7)
Platelets: 149 10*3/uL — ABNORMAL LOW (ref 150.0–400.0)
Potassium: 3.9 meq/L (ref 3.5–5.1)
RDW: 13.7 % (ref 11.5–14.6)
Sodium: 131 meq/L — ABNORMAL LOW (ref 135–145)
Total Bilirubin: 0.8 mg/dL (ref 0.3–1.2)
Total Protein: 7.8 g/dL (ref 6.0–8.3)
Triglycerides: 107 mg/dL (ref 0.0–149.0)
WBC: 6 10*3/uL (ref 4.5–10.5)

## 2010-02-23 ENCOUNTER — Ambulatory Visit: Payer: Self-pay | Admitting: Internal Medicine

## 2010-03-07 ENCOUNTER — Ambulatory Visit: Payer: Self-pay | Admitting: Oncology

## 2010-04-07 ENCOUNTER — Ambulatory Visit: Payer: Self-pay | Admitting: Oncology

## 2010-04-07 ENCOUNTER — Encounter: Payer: Self-pay | Admitting: Internal Medicine

## 2010-04-07 LAB — CBC WITH DIFFERENTIAL/PLATELET
BASO%: 0.3 % (ref 0.0–2.0)
EOS%: 1.8 % (ref 0.0–7.0)
MONO#: 0.4 10*3/uL (ref 0.1–0.9)
NEUT#: 2.7 10*3/uL (ref 1.5–6.5)
RBC: 4.72 10*6/uL (ref 3.70–5.45)
RDW: 13.5 % (ref 11.2–14.5)
lymph#: 3.6 10*3/uL — ABNORMAL HIGH (ref 0.9–3.3)

## 2010-04-08 LAB — IGG, IGA, IGM
IgA: 300 mg/dL (ref 68–378)
IgM, Serum: 55 mg/dL — ABNORMAL LOW (ref 60–263)

## 2010-04-11 ENCOUNTER — Telehealth: Payer: Self-pay | Admitting: Internal Medicine

## 2010-04-19 ENCOUNTER — Telehealth: Payer: Self-pay | Admitting: Internal Medicine

## 2010-05-31 ENCOUNTER — Ambulatory Visit: Payer: Self-pay | Admitting: Oncology

## 2010-06-01 ENCOUNTER — Ambulatory Visit: Payer: Self-pay | Admitting: Internal Medicine

## 2010-06-01 LAB — CONVERTED CEMR LAB
Basophils Absolute: 0 10*3/uL (ref 0.0–0.1)
Basophils Relative: 0.6 % (ref 0.0–3.0)
Chloride: 102 meq/L (ref 96–112)
Eosinophils Absolute: 0.1 10*3/uL (ref 0.0–0.7)
Glucose, Bld: 99 mg/dL (ref 70–99)
Hemoglobin: 14.1 g/dL (ref 12.0–15.0)
Lymphocytes Relative: 47.2 % — ABNORMAL HIGH (ref 12.0–46.0)
Lymphs Abs: 3.6 10*3/uL (ref 0.7–4.0)
MCHC: 34.4 g/dL (ref 30.0–36.0)
Neutrophils Relative %: 45.5 % (ref 43.0–77.0)
Platelets: 147 10*3/uL — ABNORMAL LOW (ref 150.0–400.0)
RBC: 4.38 M/uL (ref 3.87–5.11)
Sodium: 141 meq/L (ref 135–145)
Vitamin B-12: 569 pg/mL (ref 211–911)
WBC: 7.6 10*3/uL (ref 4.5–10.5)

## 2010-06-08 ENCOUNTER — Ambulatory Visit: Payer: Self-pay | Admitting: Internal Medicine

## 2010-07-27 ENCOUNTER — Ambulatory Visit: Payer: Self-pay | Admitting: Oncology

## 2010-07-28 ENCOUNTER — Encounter: Payer: Self-pay | Admitting: Internal Medicine

## 2010-07-28 LAB — CBC WITH DIFFERENTIAL/PLATELET
BASO%: 0.3 % (ref 0.0–2.0)
Basophils Absolute: 0 10*3/uL (ref 0.0–0.1)
EOS%: 1.1 % (ref 0.0–7.0)
Eosinophils Absolute: 0.1 10*3/uL (ref 0.0–0.5)
HCT: 42.6 % (ref 34.8–46.6)
HGB: 14.8 g/dL (ref 11.6–15.9)
LYMPH%: 52.6 % — ABNORMAL HIGH (ref 14.0–49.7)
MCH: 31.2 pg (ref 25.1–34.0)
MCHC: 34.7 g/dL (ref 31.5–36.0)
MCV: 89.9 fL (ref 79.5–101.0)
MONO#: 0.5 10*3/uL (ref 0.1–0.9)
MONO%: 6.4 % (ref 0.0–14.0)
NEUT#: 3.1 10*3/uL (ref 1.5–6.5)
NEUT%: 39.6 % (ref 38.4–76.8)
Platelets: 122 10*3/uL — ABNORMAL LOW (ref 145–400)
RBC: 4.74 10*6/uL (ref 3.70–5.45)
RDW: 13.5 % (ref 11.2–14.5)
WBC: 7.8 10*3/uL (ref 3.9–10.3)
lymph#: 4.1 10*3/uL — ABNORMAL HIGH (ref 0.9–3.3)
nRBC: 0 % (ref 0–0)

## 2010-08-24 ENCOUNTER — Ambulatory Visit (HOSPITAL_BASED_OUTPATIENT_CLINIC_OR_DEPARTMENT_OTHER): Payer: 59 | Admitting: Oncology

## 2010-08-24 DIAGNOSIS — C9110 Chronic lymphocytic leukemia of B-cell type not having achieved remission: Secondary | ICD-10-CM

## 2010-08-24 DIAGNOSIS — D801 Nonfamilial hypogammaglobulinemia: Secondary | ICD-10-CM

## 2010-08-25 NOTE — Letter (Signed)
Summary: [facility]  [facility]   Imported By: Sherian Rein 05/03/2010 09:07:34  _____________________________________________________________________  External Attachment:    Type:   Image     Comment:   External Document

## 2010-08-25 NOTE — Progress Notes (Signed)
Summary: Aciphex Rf  Phone Note From Pharmacy   Summary of Call: Rf request for Aciphex.  Med  is not on active list.  Ok to Rf? Initial call taken by: Lanier Prude, [facility]),  April 19, 2010 10:07 AM  Follow-up for Phone Call        ok x 11 Follow-up by: Tresa Garter MD,  April 19, 2010 6:00 PM    New/Updated Medications: ACIPHEX 20 MG TBEC (RABEPRAZOLE SODIUM) 1 once daily Prescriptions: ACIPHEX 20 MG TBEC (RABEPRAZOLE SODIUM) 1 once daily  #90 x 3   Entered by:   Lamar Sprinkles, CMA   Authorized by:   Tresa Garter MD   Signed by:   Lamar Sprinkles, CMA on 04/20/2010   Method used:   Electronically to        [facility]  [address] [id number] (ret[facility]       [address]       [place], [place]  [address]       Ph: [phone] or [phone]       Fax: [phone]   RxID:   [id number]

## 2010-08-25 NOTE — Assessment & Plan Note (Signed)
Summary: 3 MO ROV /NWS  #   Vital Signs:  Patient profile:   77 year old female Weight:      172 pounds BMI:     29.63 Temp:     98.6 degrees F oral Pulse rate:   69 / minute BP sitting:   110 / 84  (left arm)  Vitals Entered By: Tora Perches (October 20, 2009 9:53 AM) CC: f/u Is Patient Diabetic? No   CC:  f/u.  History of Present Illness: The patient presents for a follow up of hypertension, B12 def, neuropathy, hyperlipidemia   Preventive Screening-Counseling & Management  Alcohol-Tobacco     Smoking Status: never  Caffeine-Diet-Exercise     Does Patient Exercise: yes  Current Medications (verified): 1)  Neurontin 300 Mg Caps (Gabapentin) .... Take 1 By Mouth  A Day Need Follow-Up Appt For Addtional Refills 2)  Simvastatin 20 Mg Tabs (Simvastatin) .Marland Kitchen.. 1 By Mouth Qd 3)  Vitamin D3 1000 Unit  Tabs (Cholecalciferol) .Marland Kitchen.. 1 Qd 4)  Klor-Con 10 10 Meq  Tbcr (Potassium Chloride) .Marland Kitchen.. 1 By Mouth Qd 5)  Claritin 10 Mg  Tabs (Loratadine) .Marland Kitchen.. 1 By Mouth Qd 6)  Travatan 0.004 %  Soln (Travoprost) .Marland Kitchen.. 1 Gtt Ou Qhs 7)  Ecotrin Low Strength 81 Mg  Tbec (Aspirin) .Marland Kitchen.. 1po Qd 8)  Tylenol Extra Strength 500 Mg Tabs (Acetaminophen) .... Three Times A Day 9)  Grape C Extract .... Two Times A Day 10)  Cobal-1000 1000 Mcg/ml Inj Soln (Cyanocobalamin) .... Administer 1cc Sq Every 4 Weeks 11)  Accusure Insulin Syringe 29g X 1/2" 0.5 Ml  Misc (Insulin Syringe-Needle U-100) .... As Dirrected For Vit B12 Shots 12)  Ginseng 250 Mg Caps (Ginseng) .... Two Times A Day 13)  Hydrochlorothiazide 12.5 Mg Caps (Hydrochlorothiazide) .Marland Kitchen.. 1 By Mouth Once Daily For Blood Pressure  Allergies: 1)  Biaxin 2)  Darvocet A500 3)  Percodan (Oxycodone-Aspirin)  Past History:  Past Medical History: Last updated: 07/21/2009 Hyperlipidemia Hypertension CLL   Dr Clovis Riley on IV Ig Allergic rhinitis Colonic polyps, hx of Diverticulosis, colon Osteoarthritis Pernicious anemia glaucoma Peripheral  neuropathy Vit B12 def Chronic ethmoid sinusitis UTI 2010  Social History: Retired Single Never Smoked Alcohol use-no Regular exercise-yes  Physical Exam  General:  obese.  does not appear ill. Mouth:  Oral mucosa and oropharynx without lesions or exudates.  Teeth in good repair. Neck:  supple, full ROM, and no masses.   Lungs:  Normal respiratory effort, chest expands symmetrically. Lungs are clear to auscultation, no crackles or wheezes. Heart:  Normal rate and regular rhythm. S1 and S2 normal without gallop, murmur, click, rub or other extra sounds. Abdomen:  no suprapubic tenderness. Msk:  No deformity or scoliosis noted of thoracic or lumbar spine.  Lumbar-sacral spine is tender to palpation over paraspinal muscles and painfull with the ROM  Neurologic:  No cranial nerve deficits noted. Station and gait are normal. Plantar reflexes are down-going bilaterally. DTRs are symmetrical throughout. Sensory, motor and coordinative functions appear intact. Skin:  Intact without suspicious lesions or rashes Psych:  Oriented X3.     Impression & Recommendations:  Problem # 1:  B12 DEFICIENCY (ICD-266.2) Assessment Unchanged  Problem # 2:  PERIPHERAL NEUROPATHY (ICD-356.9) Assessment: Unchanged  Problem # 3:  HYPERTENSION (ICD-401.9) Assessment: Unchanged  Her updated medication list for this problem includes:    Hydrochlorothiazide 12.5 Mg Caps (Hydrochlorothiazide) .Marland Kitchen... 1 by mouth once daily for blood pressure  BP today: 110/84 Prior BP: 120/64 (  07/21/2009)  Prior 10 Yr Risk Heart Disease: 9 % (05/18/2008)  Labs Reviewed: K+: 3.7 (10/13/2009) Creat: : 1.0 (10/13/2009)   Chol: 182 (07/14/2009)   HDL: 52.70 (07/14/2009)   LDL: 105 (07/14/2009)   TG: 123.0 (07/14/2009)  Problem # 4:  HYPERLIPIDEMIA (ICD-272.4) Assessment: Improved  Her updated medication list for this problem includes:    Simvastatin 20 Mg Tabs (Simvastatin) .Marland Kitchen... 1 by mouth qd  Labs Reviewed: SGOT:  22 (07/14/2009)   SGPT: 19 (07/14/2009)  Prior 10 Yr Risk Heart Disease: 9 % (05/18/2008)   HDL:52.70 (07/14/2009), 50.80 (01/12/2009)  LDL:105 (07/14/2009), 100 (01/12/2009)  Chol:182 (07/14/2009), 168 (01/12/2009)  Trig:123.0 (07/14/2009), 88.0 (01/12/2009)  Complete Medication List: 1)  Neurontin 300 Mg Caps (Gabapentin) .... Take 1 by mouth  a day need follow-up appt for addtional refills 2)  Simvastatin 20 Mg Tabs (Simvastatin) .Marland Kitchen.. 1 by mouth qd 3)  Vitamin D3 1000 Unit Tabs (Cholecalciferol) .Marland Kitchen.. 1 qd 4)  Klor-con 10 10 Meq Tbcr (Potassium chloride) .Marland Kitchen.. 1 by mouth qd 5)  Claritin 10 Mg Tabs (Loratadine) .Marland Kitchen.. 1 by mouth qd 6)  Travatan 0.004 % Soln (Travoprost) .Marland Kitchen.. 1 gtt ou qhs 7)  Ecotrin Low Strength 81 Mg Tbec (Aspirin) .Marland Kitchen.. 1po qd 8)  Tylenol Extra Strength 500 Mg Tabs (Acetaminophen) .... Three times a day 9)  Grape C Extract  .... Two times a day 10)  Cobal-1000 1000 Mcg/ml Inj Soln (Cyanocobalamin) .... Administer 1cc sq every 4 weeks 11)  Accusure Insulin Syringe 29g X 1/2" 0.5 Ml Misc (Insulin syringe-needle u-100) .... As dirrected for vit b12 shots 12)  Ginseng 250 Mg Caps (Ginseng) .... Two times a day 13)  Hydrochlorothiazide 12.5 Mg Caps (Hydrochlorothiazide) .Marland Kitchen.. 1 by mouth once daily for blood pressure 14)  Amoxicillin 500 Mg Caps (Amoxicillin) .... 2 caps by mouth bid  Patient Instructions: 1)  Start taking a chair yoga class 2)  Please schedule a follow-up appointment in 4 months. 3)  BMP prior to visit, ICD-9: 4)  Hepatic Panel prior to visit, ICD-9: 5)  Lipid Panel prior to visit, ICD-9: 6)  TSH prior to visit, ICD-9: 995.20   401.1 7)  CBC w/ Diff prior to visit, ICD-9: Prescriptions: AMOXICILLIN 500 MG CAPS (AMOXICILLIN) 2 caps by mouth bid  #40 x 0   Entered and Authorized by:   Tresa Garter MD   Signed by:   Tresa Garter MD on 10/20/2009   Method used:   Print then Give to Patient   RxID:   [id number] NEURONTIN 300 MG CAPS  (GABAPENTIN) take 1 by mouth  a day Need follow-up appt for addtional refills  #90 x 3   Entered and Authorized by:   Tresa Garter MD   Signed by:   Tresa Garter MD on 10/20/2009   Method used:   Print then Give to Patient   RxID:   [id number]

## 2010-08-25 NOTE — Assessment & Plan Note (Signed)
Summary: 4 MO ROV /NWS #   Vital Signs:  Patient profile:   77 year old female Height:      64 inches Weight:      167 pounds BMI:     28.77 O2 Sat:      97 % on Room air Temp:     98.2 degrees F oral Pulse rate:   73 / minute Pulse rhythm:   regular Resp:     16 per minute BP sitting:   138 / 80  (left arm) Cuff size:   regular  Vitals Entered By: Lanier Prude, CMA(AAMA) (February 23, 2010 10:11 AM)  O2 Flow:  Room air CC: 4 mo f/u c/o Lt neck/jaw sore&tender X 1 mo Is Patient Diabetic? No   CC:  4 mo f/u c/o Lt neck/jaw sore&tender X 1 mo.  History of Present Illness: The patient presents for a preventive care examination  Patient past medical history, social history, and family history reviewed in detail no significant changes.  Patient is physically active. Depression is negative and mood is good. Hearing is normal, and able to perform activities of daily living. Risk of falling is negligible and home safety has been reviewed and is appropriate. Patient has normal height, weight, and visual acuity. Patient has been counseled on age-appropriate routine health concerns for screening and prevention. Education, counseling done.  F/u HTN, B12 def, hyperlipidemia C/o fatigue   Preventive Screening-Counseling & Management  Alcohol-Tobacco     Alcohol drinks/day: 0     Smoking Status: never  Caffeine-Diet-Exercise     Diet Counseling: not indicated; diet is assessed to be healthy     Exercise Counseling: not indicated; exercise is adequate  Hep-HIV-STD-Contraception     Hepatitis Risk: no risk noted     Dental Visit-last 6 months yes     SBE monthly: yes     Sun Exposure-Excessive: no  Safety-Violence-Falls     Seat Belt Use: yes     Smoke Detectors: yes     Violence in the Home: no risk noted     Sexual Abuse: no     Fall Risk Counseling: counseling provided; falls with injury noted      Drug Use:  never.        Travel History:  no.    Current Medications  (verified): 1)  Neurontin 300 Mg Caps (Gabapentin) .... Take 1 By Mouth  A Day Need Follow-Up Appt For Addtional Refills 2)  Simvastatin 20 Mg Tabs (Simvastatin) .Marland Kitchen.. 1 By Mouth Qd 3)  Vitamin D3 1000 Unit  Tabs (Cholecalciferol) .Marland Kitchen.. 1 Qd 4)  Klor-Con 10 10 Meq  Tbcr (Potassium Chloride) .Marland Kitchen.. 1 By Mouth Qd 5)  Claritin 10 Mg  Tabs (Loratadine) .Marland Kitchen.. 1 By Mouth Qd 6)  Travatan 0.004 %  Soln (Travoprost) .Marland Kitchen.. 1 Gtt Ou Qhs 7)  Ecotrin Low Strength 81 Mg  Tbec (Aspirin) .Marland Kitchen.. 1po Qd 8)  Tylenol Extra Strength 500 Mg Tabs (Acetaminophen) .... Three Times A Day 9)  Grape C Extract .... Two Times A Day 10)  Cobal-1000 1000 Mcg/ml Inj Soln (Cyanocobalamin) .... Administer 1cc Sq Every 4 Weeks 11)  Accusure Insulin Syringe 29g X 1/2" 0.5 Ml  Misc (Insulin Syringe-Needle U-100) .... As Dirrected For Vit B12 Shots 12)  Ginseng 250 Mg Caps (Ginseng) .... Two Times A Day 13)  Hydrochlorothiazide 12.5 Mg Caps (Hydrochlorothiazide) .Marland Kitchen.. 1 By Mouth Once Daily For Blood Pressure  Allergies (verified): 1)  Biaxin 2)  Darvocet A500 3)  Percodan (Oxycodone-Aspirin)  Past History:  Past Medical History: Last updated: 07/21/2009 Hyperlipidemia Hypertension CLL   Dr Clovis Riley on IV Ig Allergic rhinitis Colonic polyps, hx of Diverticulosis, colon Osteoarthritis Pernicious anemia glaucoma Peripheral neuropathy Vit B12 def Chronic ethmoid sinusitis UTI 2010  Family History: Last updated: 07/29/2007 Family History Hypertension mother with uterine cancer sister with brain tumor neice with breast cancer father with juandice/died of homicide son with stroke at 13yo - in [place]  Social History: Last updated: 10/20/2009 Retired Single Never Smoked Alcohol use-no Regular exercise-yes  Past Surgical History: Bilat. Cataracts (2007) Hysterectomy & bladder tack (2841) Ureter reconstruction (3244) L4-L5 Laminetomy (1999) Tubaligation (0102) Colonoscopy Dr Russella Dar ? 2005  Social History: Hepatitis  Risk:  no risk noted Dental Care w/in 6 mos.:  yes Sun Exposure-Excessive:  no Seat Belt Use:  yes Drug Use:  never  Review of Systems  The patient denies anorexia, fever, weight loss, weight gain, vision loss, decreased hearing, hoarseness, chest pain, syncope, dyspnea on exertion, peripheral edema, prolonged cough, headaches, hemoptysis, abdominal pain, melena, hematochezia, severe indigestion/heartburn, hematuria, incontinence, genital sores, muscle weakness, suspicious skin lesions, transient blindness, difficulty walking, depression, unusual weight change, abnormal bleeding, enlarged lymph nodes, angioedema, and breast masses.         Tired  Physical Exam  General:  obese.  does not appear ill. Head:  Normocephalic and atraumatic without obvious abnormalities. No apparent alopecia or balding. Eyes:  WNL Ears:  External ear exam shows no significant lesions or deformities.  Otoscopic examination reveals clear canals, tympanic membranes are intact bilaterally without bulging, retraction, inflammation or discharge. Hearing is grossly normal bilaterally. Nose:  External nasal examination shows no deformity or inflammation. Nasal mucosa are pink and moist without lesions or exudates. Mouth:  Oral mucosa and oropharynx without lesions or exudates.  Teeth in good repair. Neck:  No deformities, masses, or tenderness noted. Lungs:  Normal respiratory effort, chest expands symmetrically. Lungs are clear to auscultation, no crackles or wheezes. Heart:  Normal rate and regular rhythm. S1 and S2 normal without gallop, murmur, click, rub or other extra sounds. Abdomen:  Bowel sounds positive,abdomen soft and non-tender without masses, organomegaly or hernias noted. Msk:  No deformity or scoliosis noted of thoracic or lumbar spine.   Pulses:  R and L carotid,radial,femoral,dorsalis pedis and posterior tibial pulses are full and equal bilaterally Extremities:  No clubbing, cyanosis, edema, or  deformity noted with normal full range of motion of all joints.   Neurologic:  No cranial nerve deficits noted. Station and gait are normal. Plantar reflexes are down-going bilaterally. DTRs are symmetrical throughout. Sensory, motor and coordinative functions appear intact. Skin:  Intact without suspicious lesions or rashes Cervical Nodes:  No lymphadenopathy noted Inguinal Nodes:  No significant adenopathy Psych:  Cognition and judgment appear intact. Alert and cooperative with normal attention span and concentration. No apparent delusions, illusions, hallucinations   Impression & Recommendations:  Problem # 1:  PREVENTIVE HEALTH CARE (ICD-V70.0) Assessment New  Overall doing well, age appropriate education and counseling updated and referral for appropriate preventive services done unless declined, immunizations up to date or declined, diet counseling done if overweight, urged to quit smoking if smokes, most recent labs reviewed and current ordered if appropriate, ecg reviewed or declined (interpretation per ECG scanned in the EMR if done); information regarding Medicare Preventation requirements given if appropriate.  The labs were reviewed with the patient.  Colonoscopy - Dr Russella Dar will contact with pt when due  Orders: First annual wellness  visit with prevention plan  (W2956)  Problem # 2:  FATIGUE (ICD-780.79) multifactorial Assessment: Unchanged  Problem # 3:  GASTROESOPHAGEAL REFLUX DISEASE (ICD-530.81) Assessment: Unchanged On as needed treatment  Problem # 4:  B12 DEFICIENCY (ICD-266.2) Assessment: Unchanged  on treatment plan  Orders: Vit B12 1000 mcg (J3420) Admin of Therapeutic Inj  intramuscular or subcutaneous (21308)  Problem # 5:  PERIPHERAL NEUROPATHY (ICD-356.9) Assessment: Unchanged On the regimen of medicine(s) reflected in the chart    Problem # 6:  HYPERTENSION (ICD-401.9) Assessment: Unchanged  Her updated medication list for this problem includes:     Hydrochlorothiazide 12.5 Mg Caps (Hydrochlorothiazide) .Marland Kitchen... 1 by mouth once daily for blood pressure  Problem # 7:  HYPERLIPIDEMIA (ICD-272.4) Assessment: Unchanged  Her updated medication list for this problem includes:    Simvastatin 20 Mg Tabs (Simvastatin) .Marland Kitchen... 1 by mouth qd  Complete Medication List: 1)  Neurontin 300 Mg Caps (Gabapentin) .... Take 1 by mouth  a day need follow-up appt for addtional refills 2)  Simvastatin 20 Mg Tabs (Simvastatin) .Marland Kitchen.. 1 by mouth qd 3)  Vitamin D3 1000 Unit Tabs (Cholecalciferol) .Marland Kitchen.. 1 qd 4)  Klor-con 10 10 Meq Tbcr (Potassium chloride) .Marland Kitchen.. 1 by mouth qd 5)  Claritin 10 Mg Tabs (Loratadine) .Marland Kitchen.. 1 by mouth qd 6)  Travatan 0.004 % Soln (Travoprost) .Marland Kitchen.. 1 gtt ou qhs 7)  Ecotrin Low Strength 81 Mg Tbec (Aspirin) .Marland Kitchen.. 1po qd 8)  Tylenol Extra Strength 500 Mg Tabs (Acetaminophen) .... Three times a day 9)  Grape C Extract  .... Two times a day 10)  Cobal-1000 1000 Mcg/ml Inj Soln (Cyanocobalamin) .... Administer 1cc sq every 4 weeks 11)  Accusure Insulin Syringe 29g X 1/2" 0.5 Ml Misc (Insulin syringe-needle u-100) .... As dirrected for vit b12 shots 12)  Ginseng 250 Mg Caps (Ginseng) .... Two times a day 13)  Hydrochlorothiazide 12.5 Mg Caps (Hydrochlorothiazide) .Marland Kitchen.. 1 by mouth once daily for blood pressure  Patient Instructions: 1)  Please schedule a follow-up appointment in 3-4  months. 2)  BMP prior to visit, ICD-9: 3)  CBC w/ Diff prior to visit, ICD-9: 4)  Vit B12 266.20 Prescriptions: NEURONTIN 300 MG CAPS (GABAPENTIN) take 1 by mouth  a day Need follow-up appt for addtional refills  #90 x 3   Entered and Authorized by:   Tresa Garter MD   Signed by:   Tresa Garter MD on 02/23/2010   Method used:   Print then Give to Patient   RxID:   [id number]   Prevention & Chronic Care Immunizations   Influenza vaccine: Fluvax MCR  (05/18/2008)    Tetanus booster: Not documented    Pneumococcal vaccine: Not documented    Pneumococcal vaccine due: 02/22/2011    H. zoster vaccine: Not documented  Colorectal Screening   Hemoccult: Not documented    Colonoscopy: Done  (05/19/2003)  Other Screening   Pap smear: Not documented    Mammogram: Not documented    DXA bone density scan: Not documented   Smoking status: never  (02/23/2010)  Lipids   Total Cholesterol: 174  (02/16/2010)   Lipid panel action/deferral: Not indicated   LDL: 101  (02/16/2010)   LDL Direct: Not documented   HDL: 51.80  (02/16/2010)   Triglycerides: 107.0  (02/16/2010)    SGOT (AST): 25  (02/16/2010)   SGPT (ALT): 20  (02/16/2010)   Alkaline phosphatase: 48  (02/16/2010)   Total bilirubin: 0.8  (02/16/2010)    Lipid flowsheet reviewed?:  Yes   Progress toward LDL goal: At goal  Hypertension   Last Blood Pressure: 138 / 80  (02/23/2010)   Serum creatinine: 0.8  (02/16/2010)   Serum potassium 3.9  (02/16/2010)    Hypertension flowsheet reviewed?: Yes   Progress toward BP goal: At goal  Self-Management Support :    Patient will work on the following items until the next clinic visit to reach self-care goals:     Medications and monitoring: take my medicines every day  (02/23/2010)     Activity: take a 30 minute walk every day  (02/23/2010)    Hypertension self-management support: BP self-monitoring log  (02/23/2010)    Lipid self-management support: Written self-care plan  (02/23/2010)   Lipid self-care plan printed.   Medication Administration  Injection # 1:    Medication: Vit B12 1000 mcg    Diagnosis: B12 DEFICIENCY (ICD-266.2)    Route: IM    Site: L deltoid    Exp Date: 10/23/2011    Lot #: [id number]    Mfr: [employer]    Patient tolerated injection without complications    Given by: Lanier Prude, CMA(AAMA) (February 23, 2010 11:03 AM)  Orders Added: 1)  Vit B12 1000 mcg [J3420] 2)  Admin of Therapeutic Inj  intramuscular or subcutaneous [96372] 3)  First annual wellness visit with  prevention plan  [G0438] 4)  Est. Patient Level IV [78469]

## 2010-08-25 NOTE — Letter (Signed)
[facility]   Im[facility] By: Lennie Odor 10/21/2009 16:35:10  _____________________________________________________________________  External Attachment:    Type:   Image     Comment:   External Document

## 2010-08-25 NOTE — Letter (Signed)
Summary: [facility]   Imported By: Lester [place] 10/25/2009 08:10:13  _____________________________________________________________________  External Attachment:    Type:   Image     Comment:   External Document

## 2010-08-25 NOTE — Assessment & Plan Note (Signed)
Summary: 3 mo rov /nws  #   Vital Signs:  Patient profile:   77 year old female Height:      64 inches Weight:      166 pounds BMI:     28.60 Temp:     97.8 degrees F oral Pulse rate:   68 / minute Pulse rhythm:   regular Resp:     16 per minute BP sitting:   148 / 72  (left arm) Cuff size:   regular  Vitals Entered By: Lanier Prude, CMA(AAMA) (June 08, 2010 11:15 AM) CC: 3 mo f/u Is Patient Diabetic? No   CC:  3 mo f/u.  History of Present Illness: The patient presents for a follow up of hypertension, pernicious anemia, hyperlipidemia    Current Medications (verified): 1)  Neurontin 300 Mg Caps (Gabapentin) .... Take 1 By Mouth  A Day Need Follow-Up Appt For Addtional Refills 2)  Simvastatin 20 Mg Tabs (Simvastatin) .Marland Kitchen.. 1 By Mouth Qd 3)  Vitamin D3 1000 Unit  Tabs (Cholecalciferol) .Marland Kitchen.. 1 Qd 4)  Klor-Con 10 10 Meq  Tbcr (Potassium Chloride) .Marland Kitchen.. 1 By Mouth Qd 5)  Claritin 10 Mg  Tabs (Loratadine) .Marland Kitchen.. 1 By Mouth Qd 6)  Travatan 0.004 %  Soln (Travoprost) .Marland Kitchen.. 1 Gtt Ou Qhs 7)  Ecotrin Low Strength 81 Mg  Tbec (Aspirin) .Marland Kitchen.. 1po Qd 8)  Tylenol Extra Strength 500 Mg Tabs (Acetaminophen) .... Three Times A Day 9)  Grape C Extract .... Two Times A Day 10)  Cobal-1000 1000 Mcg/ml Inj Soln (Cyanocobalamin) .... Administer 1cc Sq Every 4 Weeks 11)  Accusure Insulin Syringe 29g X 1/2" 0.5 Ml  Misc (Insulin Syringe-Needle U-100) .... As Dirrected For Vit B12 Shots 12)  Ginseng 250 Mg Caps (Ginseng) .... Two Times A Day 13)  Hydrochlorothiazide 12.5 Mg Caps (Hydrochlorothiazide) .Marland Kitchen.. 1 By Mouth Once Daily For Blood Pressure 14)  Diflucan 150 Mg Tabs (Fluconazole) .Marland Kitchen.. 1 By Mouth Once Daily Once For Yeast Infection 15)  Aciphex 20 Mg Tbec (Rabeprazole Sodium) .Marland Kitchen.. 1 Once Daily  Allergies (verified): 1)  Biaxin 2)  Darvocet A500 3)  Percodan (Oxycodone-Aspirin)  Past History:  Past Medical History: Last updated: 07/21/2009 Hyperlipidemia Hypertension CLL   Dr  Clovis Riley on IV Ig Allergic rhinitis Colonic polyps, hx of Diverticulosis, colon Osteoarthritis Pernicious anemia glaucoma Peripheral neuropathy Vit B12 def Chronic ethmoid sinusitis UTI 2010  Social History: Last updated: 10/20/2009 Retired Single Never Smoked Alcohol use-no Regular exercise-yes  Review of Systems  The patient denies fever, hemoptysis, abdominal pain, difficulty walking, depression, and unusual weight change.    Physical Exam  General:  obese.  does not appear ill. Nose:  External nasal examination shows no deformity or inflammation. Nasal mucosa are pink and moist without lesions or exudates. Mouth:  Oral mucosa and oropharynx without lesions or exudates.  Teeth in good repair. Neck:  No deformities, masses, or tenderness noted. Lungs:  Normal respiratory effort, chest expands symmetrically. Lungs are clear to auscultation, no crackles or wheezes. Heart:  Normal rate and regular rhythm. S1 and S2 normal without gallop, murmur, click, rub or other extra sounds. Abdomen:  Bowel sounds positive,abdomen soft and non-tender without masses, organomegaly or hernias noted. Msk:  No deformity or scoliosis noted of thoracic or lumbar spine.   Extremities:  No clubbing, cyanosis, edema, or deformity noted with normal full range of motion of all joints.   Neurologic:  No cranial nerve deficits noted. Station and gait are normal. Plantar reflexes are  down-going bilaterally. DTRs are symmetrical throughout. Sensory, motor and coordinative functions appear intact. Skin:  Intact without suspicious lesions or rashes Psych:  Cognition and judgment appear intact. Alert and cooperative with normal attention span and concentration. No apparent delusions, illusions, hallucinations   Impression & Recommendations:  Problem # 1:  FATIGUE (ICD-780.79) Assessment Unchanged  Problem # 2:  CLL (ICD-204.10) Assessment: Unchanged On IgG  Problem # 3:  B12 DEFICIENCY  (ICD-266.2) Assessment: Improved The labs were reviewed with the patient.   Problem # 4:  OSTEOARTHRITIS (ICD-715.90) Assessment: Unchanged  Her updated medication list for this problem includes:    Ecotrin Low Strength 81 Mg Tbec (Aspirin) .Marland Kitchen... 1po qd    Tylenol Extra Strength 500 Mg Tabs (Acetaminophen) .Marland Kitchen... Three times a day  Problem # 5:  HYPERTENSION (ICD-401.9) Assessment: Unchanged  Her updated medication list for this problem includes:    Hydrochlorothiazide 12.5 Mg Caps (Hydrochlorothiazide) .Marland Kitchen... 1 by mouth once daily for blood pressure  Problem # 6:  HYPERLIPIDEMIA (ICD-272.4) Assessment: Unchanged  Her updated medication list for this problem includes:    Simvastatin 20 Mg Tabs (Simvastatin) .Marland Kitchen... 1 by mouth qd  Complete Medication List: 1)  Neurontin 300 Mg Caps (Gabapentin) .... Take 1 by mouth  a day need follow-up appt for addtional refills 2)  Simvastatin 20 Mg Tabs (Simvastatin) .Marland Kitchen.. 1 by mouth qd 3)  Vitamin D3 1000 Unit Tabs (Cholecalciferol) .Marland Kitchen.. 1 qd 4)  Klor-con 10 10 Meq Tbcr (Potassium chloride) .Marland Kitchen.. 1 by mouth qd 5)  Claritin 10 Mg Tabs (Loratadine) .Marland Kitchen.. 1 by mouth qd 6)  Travatan 0.004 % Soln (Travoprost) .Marland Kitchen.. 1 gtt ou qhs 7)  Ecotrin Low Strength 81 Mg Tbec (Aspirin) .Marland Kitchen.. 1po qd 8)  Tylenol Extra Strength 500 Mg Tabs (Acetaminophen) .... Three times a day 9)  Grape C Extract  .... Two times a day 10)  Cobal-1000 1000 Mcg/ml Inj Soln (Cyanocobalamin) .... Administer 1cc sq every 4 weeks 11)  Accusure Insulin Syringe 29g X 1/2" 0.5 Ml Misc (Insulin syringe-needle u-100) .... As dirrected for vit b12 shots 12)  Ginseng 250 Mg Caps (Ginseng) .... Two times a day 13)  Hydrochlorothiazide 12.5 Mg Caps (Hydrochlorothiazide) .Marland Kitchen.. 1 by mouth once daily for blood pressure 14)  Diflucan 150 Mg Tabs (Fluconazole) .Marland Kitchen.. 1 by mouth once daily once for yeast infection 15)  Aciphex 20 Mg Tbec (Rabeprazole sodium) .Marland Kitchen.. 1 once daily  Other Orders: TD Toxoids IM 7  YR + (16109) Admin 1st Vaccine (60454)  Patient Instructions: 1)  Please schedule a follow-up appointment in 3 months. 995.20 2)  BMP prior to visit, ICD-9: 3)  CBC w/ Diff prior to visit, ICD-9:   Orders Added: 1)  Est. Patient Level IV [09811] 2)  TD Toxoids IM 7 YR + [90714] 3)  Admin 1st Vaccine [91478]   Immunization History:  Influenza Immunization History:    Influenza:  historical (06/01/2010)  Pneumovax Immunization History:    Pneumovax:  historical (04/07/2004)  Immunizations Administered:  Tetanus Vaccine:    Vaccine Type: Td    Site: left deltoid    Mfr: GlaxoSmithKline    Dose: 0.25 ml    Route: IM    Given by: Lanier Prude, CMA(AAMA)    Exp. Date: 05/12/2012    Lot #: [id number]    VIS given: 06/10/08 version given June 08, 2010.   Immunization History:  Influenza Immunization History:    Influenza:  Historical (06/01/2010)  Pneumovax Immunization History:    Pneumovax:  Historical (04/07/2004)  Immunizations Administered:  Tetanus Vaccine:    Vaccine Type: Td    Site: left deltoid    Mfr: GlaxoSmithKline    Dose: 0.25 ml    Route: IM    Given by: Lanier Prude, CMA(AAMA)    Exp. Date: 05/12/2012    Lot #: [id number]    VIS given: 06/10/08 version given June 08, 2010.

## 2010-08-25 NOTE — Progress Notes (Signed)
Summary: REQ FOR RX  Phone Note Call from Patient Call back at [facility] Phone [phone]   Summary of Call: Pt c/o recurrent uti. She is req rx for antibiotic.  Initial call taken by: Lamar Sprinkles, CMA,  April 11, 2010 5:28 PM  Follow-up for Phone Call        cipro OK Follow-up by: Tresa Garter MD,  April 11, 2010 5:56 PM  Additional Follow-up for Phone Call Additional follow up Details #1::        Patient informed, she would also like rx for diflucan.  Additional Follow-up by: Lamar Sprinkles, CMA,  April 11, 2010 6:09 PM    Additional Follow-up for Phone Call Additional follow up Details #2::    ok Follow-up by: Tresa Garter MD,  April 11, 2010 11:29 PM  Additional Follow-up for Phone Call Additional follow up Details #3:: Details for Additional Follow-up Action Taken: Pt informed  Additional Follow-up by: Lamar Sprinkles, CMA,  April 12, 2010 10:20 AM  New/Updated Medications: CIPROFLOXACIN HCL 250 MG TABS (CIPROFLOXACIN HCL) 1 by mouth two times a day for cystitis DIFLUCAN 150 MG TABS (FLUCONAZOLE) 1 by mouth once daily once for yeast infection Prescriptions: DIFLUCAN 150 MG TABS (FLUCONAZOLE) 1 by mouth once daily once for yeast infection  #1 x 1   Entered and Authorized by:   Tresa Garter MD   Signed by:   Lamar Sprinkles, CMA on 04/12/2010   Method used:   Electronically to        [facility]  [address] [id number] (retail)       [address]       [place], [place]  [address]       Ph: [phone] or [phone]       Fax: [phone]   RxID:   [id number] CIPROFLOXACIN HCL 250 MG TABS (CIPROFLOXACIN HCL) 1 by mouth two times a day for cystitis  #10 x 0   Entered and Authorized by:   Tresa Garter MD   Signed by:   Lamar Sprinkles, CMA on 04/11/2010   Method used:   [facility]ically to        [facility]  [address] [id number] ([facility]ail[facility]       [address]       [place], [place]  [address]  Ph: [phone] or [phone]       Fax: [phone]   RxID:   [id number]

## 2010-08-25 NOTE — Letter (Signed)
[facility]   Imported By: Lester Waubeka 02/10/2010 07:48:11  _____________________________________________________________________  External Attachment:    Type:   Image     Comment:   External Document

## 2010-08-25 NOTE — Progress Notes (Signed)
Summary: Feels bad  Phone Note Call from Patient Call back at Home Phone [phone]   Summary of Call: Pt left vm: Pt thought she had uti last tuesday. She took cefuroxime that she had on hand. She feels "sick" and weak. C/o sinus pain w/no discolored mucus. Also c/o pelvic discomfort. Please advise.  Initial call taken by: Lamar Sprinkles, CMA,  November 08, 2009 4:08 PM  Follow-up for Phone Call        Zpac UA 595.0 OV if sick Follow-up by: Tresa Garter MD,  November 08, 2009 5:31 PM  Additional Follow-up for Phone Call Additional follow up Details #1::        Pt informed  Additional Follow-up by: Lamar Sprinkles, CMA,  November 08, 2009 6:05 PM    New/Updated Medications: ZITHROMAX Z-PAK 250 MG TABS (AZITHROMYCIN) as dirrected Prescriptions: ZITHROMAX Z-PAK 250 MG TABS (AZITHROMYCIN) as dirrected  #1 x 0   Entered and Authorized by:   Tresa Garter MD   Signed by:   Lamar Sprinkles, CMA on 11/08/2009   Method used:   [facility]        [facility]  [address] [id number]       [address]       [place], [place]  [address]       Ph: [phone] or [phone]       Fax: [phone]   RxID:   [id number]

## 2010-08-31 NOTE — Letter (Signed)
[facility]: [facility]  [facility]   Imported By: Lennie Odor 08/23/2010 14:34:52  _____________________________________________________________________  External Attachment:    Type:   Image     Comment:   External Document

## 2010-09-06 ENCOUNTER — Other Ambulatory Visit: Payer: Medicare Other

## 2010-09-06 ENCOUNTER — Other Ambulatory Visit: Payer: Self-pay | Admitting: Internal Medicine

## 2010-09-06 ENCOUNTER — Encounter (INDEPENDENT_AMBULATORY_CARE_PROVIDER_SITE_OTHER): Payer: Self-pay | Admitting: *Deleted

## 2010-09-06 DIAGNOSIS — T887XXA Unspecified adverse effect of drug or medicament, initial encounter: Secondary | ICD-10-CM

## 2010-09-06 LAB — BASIC METABOLIC PANEL
BUN: 14 mg/dL (ref 6–23)
CO2: 30 mEq/L (ref 19–32)
Calcium: 9.8 mg/dL (ref 8.4–10.5)
Chloride: 103 mEq/L (ref 96–112)
Creatinine, Ser: 0.9 mg/dL (ref 0.4–1.2)
Glucose, Bld: 110 mg/dL — ABNORMAL HIGH (ref 70–99)
Sodium: 141 mEq/L (ref 135–145)

## 2010-09-06 LAB — CBC WITH DIFFERENTIAL/PLATELET
Basophils Relative: 0.4 % (ref 0.0–3.0)
Eosinophils Absolute: 0.1 10*3/uL (ref 0.0–0.7)
Lymphocytes Relative: 42 % (ref 12.0–46.0)
MCHC: 34.4 g/dL (ref 30.0–36.0)
MCV: 93.1 fl (ref 78.0–100.0)
Monocytes Relative: 4.8 % (ref 3.0–12.0)
Neutrophils Relative %: 51.9 % (ref 43.0–77.0)
RBC: 4.44 Mil/uL (ref 3.87–5.11)

## 2010-09-14 ENCOUNTER — Encounter: Payer: Self-pay | Admitting: Internal Medicine

## 2010-09-14 ENCOUNTER — Ambulatory Visit (INDEPENDENT_AMBULATORY_CARE_PROVIDER_SITE_OTHER): Payer: Medicare Other | Admitting: Internal Medicine

## 2010-09-14 DIAGNOSIS — I10 Essential (primary) hypertension: Secondary | ICD-10-CM

## 2010-09-14 DIAGNOSIS — E538 Deficiency of other specified B group vitamins: Secondary | ICD-10-CM

## 2010-09-14 DIAGNOSIS — R5381 Other malaise: Secondary | ICD-10-CM

## 2010-09-14 DIAGNOSIS — C9110 Chronic lymphocytic leukemia of B-cell type not having achieved remission: Secondary | ICD-10-CM

## 2010-09-21 ENCOUNTER — Encounter (HOSPITAL_BASED_OUTPATIENT_CLINIC_OR_DEPARTMENT_OTHER): Payer: 59 | Admitting: Oncology

## 2010-09-21 DIAGNOSIS — D801 Nonfamilial hypogammaglobulinemia: Secondary | ICD-10-CM

## 2010-09-21 DIAGNOSIS — C9110 Chronic lymphocytic leukemia of B-cell type not having achieved remission: Secondary | ICD-10-CM

## 2010-09-29 NOTE — Assessment & Plan Note (Signed)
Summary: 3 MO ROV /NWS  #   Vital Signs:  Patient profile:   77 year old female Height:      64 inches Weight:      166 pounds BMI:     28.60 Temp:     98.6 degrees F oral Pulse rate:   72 / minute Pulse rhythm:   regular Resp:     16 per minute BP sitting:   120 / 78  (left arm) Cuff size:   regular  Vitals Entered By: Lanier Prude, CMA(AAMA) (September 14, 2010 10:05 AM) CC: 3 mo f/u  Is Patient Diabetic? No Comments pt is not currently taking Diflucan   CC:  3 mo f/u .  History of Present Illness: The patient presents for a follow up of hypertension, diabetes, hyperlipidemia, B12 def ?? she had PLT 122K in Jan  Current Medications (verified): 1)  Neurontin 300 Mg Caps (Gabapentin) .... Take 1 By Mouth  A Day Need Follow-Up Appt For Addtional Refills 2)  Simvastatin 20 Mg Tabs (Simvastatin) .Marland Kitchen.. 1 By Mouth Qd 3)  Vitamin D3 1000 Unit  Tabs (Cholecalciferol) .Marland Kitchen.. 1 Qd 4)  Klor-Con 10 10 Meq  Tbcr (Potassium Chloride) .Marland Kitchen.. 1 By Mouth Qd 5)  Claritin 10 Mg  Tabs (Loratadine) .Marland Kitchen.. 1 By Mouth Qd 6)  Travatan 0.004 %  Soln (Travoprost) .Marland Kitchen.. 1 Gtt Ou Qhs 7)  Ecotrin Low Strength 81 Mg  Tbec (Aspirin) .Marland Kitchen.. 1po Qd 8)  Tylenol Extra Strength 500 Mg Tabs (Acetaminophen) .... Three Times A Day 9)  Grape C Extract .... Two Times A Day 10)  Cobal-1000 1000 Mcg/ml Inj Soln (Cyanocobalamin) .... Administer 1cc Sq Every 4 Weeks 11)  Accusure Insulin Syringe 29g X 1/2" 0.5 Ml  Misc (Insulin Syringe-Needle U-100) .... As Dirrected For Vit B12 Shots 12)  Ginseng 250 Mg Caps (Ginseng) .... Two Times A Day 13)  Hydrochlorothiazide 12.5 Mg Caps (Hydrochlorothiazide) .Marland Kitchen.. 1 By Mouth Once Daily For Blood Pressure 14)  Diflucan 150 Mg Tabs (Fluconazole) .Marland Kitchen.. 1 By Mouth Once Daily Once For Yeast Infection 15)  Aciphex 20 Mg Tbec (Rabeprazole Sodium) .Marland Kitchen.. 1 Once Daily 16)  Bd Insulin Syringe 30g X 1/2" 0.5 Ml Misc (Insulin Syringe-Needle U-100) .... Use To Administer B12 Injections  Allergies  (verified): 1)  Biaxin 2)  Darvocet A500 3)  Percodan  Past History:  Past Medical History: Last updated: 07/21/2009 Hyperlipidemia Hypertension CLL   Dr Clovis Riley on IV Ig Allergic rhinitis Colonic polyps, hx of Diverticulosis, colon Osteoarthritis Pernicious anemia glaucoma Peripheral neuropathy Vit B12 def Chronic ethmoid sinusitis UTI 2010  Social History: Last updated: 10/20/2009 Retired Single Never Smoked Alcohol use-no Regular exercise-yes  Review of Systems  The patient denies fever, chest pain, abdominal pain, hematochezia, and depression.    Physical Exam  General:  obese.  does not appear ill. Eyes:  WNL Nose:  External nasal examination shows no deformity or inflammation. Nasal mucosa are pink and moist without lesions or exudates. Mouth:  Oral mucosa and oropharynx without lesions or exudates.  Teeth in good repair. Lungs:  Normal respiratory effort, chest expands symmetrically. Lungs are clear to auscultation, no crackles or wheezes. Heart:  Normal rate and regular rhythm. S1 and S2 normal without gallop, murmur, click, rub or other extra sounds. Abdomen:  Bowel sounds positive,abdomen soft and non-tender without masses, organomegaly or hernias noted. Msk:  No deformity or scoliosis noted of thoracic or lumbar spine.   Extremities:  No clubbing, cyanosis, edema, or deformity noted  with normal full range of motion of all joints.   Neurologic:  No cranial nerve deficits noted. Station and gait are normal. Plantar reflexes are down-going bilaterally. DTRs are symmetrical throughout. Sensory, motor and coordinative functions appear intact. Skin:  Intact without suspicious lesions or rashes Cervical Nodes:  No lymphadenopathy noted Psych:  Cognition and judgment appear intact. Alert and cooperative with normal attention span and concentration. No apparent delusions, illusions, hallucinations   Impression & Recommendations:  Problem # 1:  B12 DEFICIENCY  (ICD-266.2) Assessment Improved On the regimen of medicine(s) reflected in the chart    Problem # 2:  OSTEOARTHRITIS (ICD-715.90) Assessment: Unchanged  Her updated medication list for this problem includes:    Ecotrin Low Strength 81 Mg Tbec (Aspirin) .Marland Kitchen... 1po qd    Tylenol Extra Strength 500 Mg Tabs (Acetaminophen) .Marland Kitchen... Three times a day  Problem # 3:  CLL (ICD-204.10) Assessment: Unchanged PLT 122-150 The labs were reviewed with the patient.   Problem # 4:  HYPERTENSION (ICD-401.9) Assessment: Unchanged  Her updated medication list for this problem includes:    Hydrochlorothiazide 12.5 Mg Caps (Hydrochlorothiazide) .Marland Kitchen... 1 by mouth once daily for blood pressure  BP today: 120/78 Prior BP: 148/72 (06/08/2010)  Prior 10 Yr Risk Heart Disease: 9 % (05/18/2008)  Labs Reviewed: K+: 4.2 (09/06/2010) Creat: : 0.9 (09/06/2010)   Chol: 174 (02/16/2010)   HDL: 51.80 (02/16/2010)   LDL: 101 (02/16/2010)   TG: 107.0 (02/16/2010)  Problem # 5:  HYPERLIPIDEMIA (ICD-272.4) Assessment: Unchanged  Her updated medication list for this problem includes:    Simvastatin 20 Mg Tabs (Simvastatin) .Marland Kitchen... 1 by mouth qd  Complete Medication List: 1)  Neurontin 300 Mg Caps (Gabapentin) .... Take 1 by mouth  a day need follow-up appt for addtional refills 2)  Simvastatin 20 Mg Tabs (Simvastatin) .Marland Kitchen.. 1 by mouth qd 3)  Vitamin D3 1000 Unit Tabs (Cholecalciferol) .Marland Kitchen.. 1 qd 4)  Klor-con 10 10 Meq Tbcr (Potassium chloride) .Marland Kitchen.. 1 by mouth qd 5)  Claritin 10 Mg Tabs (Loratadine) .Marland Kitchen.. 1 by mouth qd 6)  Travatan 0.004 % Soln (Travoprost) .Marland Kitchen.. 1 gtt ou qhs 7)  Ecotrin Low Strength 81 Mg Tbec (Aspirin) .Marland Kitchen.. 1po qd 8)  Tylenol Extra Strength 500 Mg Tabs (Acetaminophen) .... Three times a day 9)  Grape C Extract  .... Two times a day 10)  Cobal-1000 1000 Mcg/ml Inj Soln (Cyanocobalamin) .... Administer 1cc sq every 4 weeks 11)  Accusure Insulin Syringe 29g X 1/2" 0.5 Ml Misc (Insulin syringe-needle  u-100) .... As dirrected for vit b12 shots 12)  Ginseng 250 Mg Caps (Ginseng) .... Two times a day 13)  Hydrochlorothiazide 12.5 Mg Caps (Hydrochlorothiazide) .Marland Kitchen.. 1 by mouth once daily for blood pressure 14)  Diflucan 150 Mg Tabs (Fluconazole) .Marland Kitchen.. 1 by mouth once daily once for yeast infection 15)  Bd Insulin Syringe 30g X 1/2" 0.5 Ml Misc (Insulin syringe-needle u-100) .... Use to administer b12 injections 16)  Omeprazole 40 Mg Cpdr (Omeprazole) .Marland Kitchen.. 1 by mouth qam for indigestion  Patient Instructions: 1)  Please schedule a follow-up appointment in 3 months. 2)  BMP prior to visit, ICD-9:272.0 3)  Lipid Panel prior to visit, ICD-9: 4)  CBC w/ Diff prior to visit, ICD-9: Prescriptions: OMEPRAZOLE 40 MG CPDR (OMEPRAZOLE) 1 by mouth qam for indigestion  #90 x 3   Entered and Authorized by:   Tresa Garter MD   Signed by:   Tresa Garter MD on 09/14/2010   Method used:  Print then Give to Patient   RxID:   [id number]    Orders Added: 1)  Est. Patient Level IV [83151]

## 2010-10-19 ENCOUNTER — Encounter (HOSPITAL_BASED_OUTPATIENT_CLINIC_OR_DEPARTMENT_OTHER): Payer: 59 | Admitting: Oncology

## 2010-10-19 DIAGNOSIS — D801 Nonfamilial hypogammaglobulinemia: Secondary | ICD-10-CM

## 2010-10-19 DIAGNOSIS — C9110 Chronic lymphocytic leukemia of B-cell type not having achieved remission: Secondary | ICD-10-CM

## 2010-11-03 LAB — APTT: aPTT: 26 seconds (ref 24–37)

## 2010-11-03 LAB — LIPID PANEL
Cholesterol: 168 mg/dL (ref 0–200)
LDL Cholesterol: 103 mg/dL — ABNORMAL HIGH (ref 0–99)
VLDL: 17 mg/dL (ref 0–40)

## 2010-11-03 LAB — CBC
Hemoglobin: 14.7 g/dL (ref 12.0–15.0)
MCV: 91.4 fL (ref 78.0–100.0)
Platelets: 134 10*3/uL — ABNORMAL LOW (ref 150–400)
RBC: 4.59 MIL/uL (ref 3.87–5.11)
RBC: 4.65 MIL/uL (ref 3.87–5.11)
WBC: 6.8 10*3/uL (ref 4.0–10.5)
WBC: 7.4 10*3/uL (ref 4.0–10.5)

## 2010-11-03 LAB — PROTIME-INR: INR: 1 (ref 0.00–1.49)

## 2010-11-03 LAB — COMPREHENSIVE METABOLIC PANEL
ALT: 24 U/L (ref 0–35)
AST: 22 U/L (ref 0–37)
Albumin: 3.4 g/dL — ABNORMAL LOW (ref 3.5–5.2)
Alkaline Phosphatase: 52 U/L (ref 39–117)
Chloride: 105 mEq/L (ref 96–112)
GFR calc Af Amer: >60 mL/min (ref >60)
Potassium: 3.6 mEq/L (ref 3.5–5.1)
Total Bilirubin: 0.5 mg/dL (ref 0.3–1.2)

## 2010-11-03 LAB — URINALYSIS, ROUTINE W REFLEX MICROSCOPIC
Hgb urine dipstick: NEGATIVE
Nitrite: NEGATIVE
Specific Gravity, Urine: 1.009 (ref 1.005–1.030)
Urobilinogen, UA: 0.2 mg/dL (ref 0.0–1.0)
pH: 7 (ref 5.0–8.0)

## 2010-11-03 LAB — DIFFERENTIAL
Eosinophils Absolute: 0.1 10*3/uL (ref 0.0–0.7)
Lymphs Abs: 4.3 10*3/uL — ABNORMAL HIGH (ref 0.7–4.0)
Monocytes Absolute: 0.3 10*3/uL (ref 0.1–1.0)
Monocytes Relative: 5 % (ref 3–12)
Neutro Abs: 2.6 10*3/uL (ref 1.7–7.7)
Neutrophils Relative %: 35 % — ABNORMAL LOW (ref 43–77)

## 2010-11-03 LAB — VITAMIN B12: Vitamin B-12: 621 pg/mL (ref 211–911)

## 2010-11-03 LAB — POCT CARDIAC MARKERS
CKMB, poc: <1 ng/mL — ABNORMAL LOW (ref 1.0–8.0)
Myoglobin, poc: 64.5 ng/mL (ref 12–200)

## 2010-11-03 LAB — BASIC METABOLIC PANEL
BUN: 11 mg/dL (ref 6–23)
CO2: 30 mEq/L (ref 19–32)
Calcium: 9.5 mg/dL (ref 8.4–10.5)
Chloride: 102 mEq/L (ref 96–112)
Creatinine, Ser: 0.88 mg/dL (ref 0.4–1.2)

## 2010-11-03 LAB — URINE MICROSCOPIC-ADD ON

## 2010-11-03 LAB — HEMOGLOBIN A1C: Mean Plasma Glucose: 123 mg/dL

## 2010-11-03 LAB — TSH: TSH: 3.966 u[IU]/mL (ref 0.350–4.500)

## 2010-11-16 ENCOUNTER — Encounter (HOSPITAL_BASED_OUTPATIENT_CLINIC_OR_DEPARTMENT_OTHER): Payer: Medicare Other | Admitting: Oncology

## 2010-11-16 DIAGNOSIS — D801 Nonfamilial hypogammaglobulinemia: Secondary | ICD-10-CM

## 2010-11-16 DIAGNOSIS — C9110 Chronic lymphocytic leukemia of B-cell type not having achieved remission: Secondary | ICD-10-CM

## 2010-12-02 ENCOUNTER — Other Ambulatory Visit: Payer: Self-pay | Admitting: Internal Medicine

## 2010-12-06 NOTE — H&P (Signed)
NAMESHELLI, PORTILLA                 ACCOUNT NO.:  [id number]   MEDICAL RECORD NO.:  [id number]          PATIENT TYPE:  INP   LOCATION:  3020                         FACILITY:  [facility]   PHYSICIAN:  Michiel Cowboy, MDDATE OF BIRTH:  08-04-1925   DATE OF ADMISSION:  10/06/2008  DATE OF DISCHARGE:                              HISTORY & PHYSICAL   PRIMARY CARE PHYSICIAN:  Dr. Posey Rea.   CHIEF COMPLAINT:  Right facial droop and inability to close right eye.   The patient is an 78 year old female with history of CLL for which she  continues to receive IVIG; last treatment was Thursday.  Yesterday  night, she noticed that she had a hard time closing her right eye.  Today, this morning, she noticed that when she was trying to brush her  teeth the water was dribbling down from the corner of her mouth, and she  noticed that when she smiled she was drooping on the right side.  She  tried to see if this would get better, so it did not throughout the day,  so at this point, she presented to the emergency department, also  because she was kind of not feeling well overall but though cannot quite  per her finger on it.   She has not had any chest pain.  No shortness of breath.  No fevers.  No  chills.  No nausea.  No vomiting.  No constipation.  Otherwise, review  of systems are unremarkable.   PAST MEDICAL HISTORY:  Significant for:  1. Hypertension.  2. Pernicious anemia.  3. Hyperlipidemia.  4. CLL followed by Dr. Truett Perna.  5. Degenerative disease.  6. Edematous colon polyps.  7. Diverticulosis.  8. Allergies.  9. Status post hysterectomy.  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  11.Status post bilateral cataract surgeries.  12.Glaucoma.   ALLERGIES:  To BIAXIN, DARVOCET, PERCODAN, AND VIOXX.   SOCIAL HISTORY:  The patient never smoked.  Does not drink.  Does not  use drugs.  Lives at home.  Has 3 sons.   FAMILY HISTORY:  Significant for son who had a stroke at the age of 81  and currently in a nursing facility.   MEDICATIONS:  1. Aciphex 20 mg daily.  2. Aspirin 81 mg daily.  3. Hydrochlorothiazide 25 daily.  4. Maalox as needed.  5. Neurontin 300 twice a day.  6. Simvastatin 20 once a day.  7. Travatan 0.004% eye drop, 1 drop to both eyes b.i.d.  8. Potassium 10 mEq a day.  9. __________ 10 mg a day.  10.Vitamin B12 injections.   PHYSICAL EXAMINATION:  VITAL SIGNS:  Temperature 98.3, blood pressure  151/121 initially - now down to 147/87, pulse 65, respirations 14,  saturating 95% on room air.  The patient currently appears to be in no acute distress.  HEAD:  Nontraumatic.  Moist mucous membranes.  LUNGS:  Clear to auscultation bilaterally.  HEART:  Regular rate and rhythm.  No murmurs, rubs, or gallops  appreciated.  ABDOMEN:  Soft, nontender, nondistended.  LOWER EXTREMITIES:  Without clubbing, cyanosis, or edema.  Strength 5/5  in all 4 extremities.  Cranial nerves intact with the  exception of inability to close right eye and a droop on the right side.  Otherwise unremarkable.  Patient able to lift both eyebrows.  Finger to  nose intact.   LABORATORY DATA:  White blood cell count 7.4, hemoglobin 14.7.  Sodium  137, potassium 4.6, creatinine 0.88.  Cardiac markers negative.  CEA  unremarkable.  Chest x-ray unremarkable.  CT scan of the head showing  small fluid in the right sphenoid sinus but no evidence of ischemia or  infarction.  EKG showing normal sinus rhythm, heart rate 62, positive  for first degree AV block, very small T waves, no ischemic changes.   ASSESSMENT AND PLAN:  This is an 77 year old female with a history of  chronic lymphocytic leukemia who has been receiving IVIG, now presents  with localized right-sided facial weakness, facial drooping and  difficulty closing right eye.  No slurred speech.  No other symptoms.  No vertigo.   1. Possible cerebrovascular accident.  Will obtain MRI/MRA of the      brain.  Obtain 2-D  echocardiogram, carotid Dopplers.  Will hold      hydrochlorothiazide.  Will probably benefit from neurology consult.      Also wonder if IVIG could have contributed to the symptoms or not.      Will need to discuss with Dr. Truett Perna.  2. Hypertension.  For right now, hold hydrochlorothiazide.  3. Eye irritation.  We will make sure she has Artificial Tears and      will tape right eye lid closed while sleeping.  4. Glaucoma.  Continue Travatan.  5. Will check fasting lipid panel, hemoglobin A1c, TSH, B12 level,      folate level and homocysteine level.  6. Prophylaxis with Protonix.  The patient has a history of GERD.      SCDs.   CODE STATUS:  At this point, the patient is unsure of her code status.  Will make by Mechelle Pates full code.    Dr. Rene Paci will resume care in a.m.      Michiel Cowboy, MD  Electronically Signed    AVD/MEDQ  D:  10/07/2008  T:  10/07/2008  Job:  [id number]   cc:   Georgina Quint. Plotnikov, MD

## 2010-12-06 NOTE — Assessment & Plan Note (Signed)
[facility]                           PRIMARY CARE OFFICE NOTE   [name], [name]                        MRN:          [id number]  DATE:02/21/2007                            DOB:          September 07, 1925    PROCEDURE NUMBER ONE:  Skin biopsy.  Indication:  Mole on the face.  Risks including an incomplete procedure, bleeding, infection, scar  formation and others were all  explained to the patient in detail, she  agreed to proceed.  The lesion measured 6 x 5-mm on the right cheek was  prepped with Betadine and alcohol, was injected with 0.5 mL of 2%  lidocaine with epinephrine.  Shave biopsy was performed.  Specimen sent  to the lab.  The wound base revealed dark roots growing under the  surface.  The wound was treated aggressively with a hyfercator  Bandaid  with antibiotic ointment, tolerated well.  Complications:  None.  Wound instructions:  Provided.   PROCEDURE NUMBER TWO:  Cryosurgery.  Indication:  Actinic keratosis on the face.  Three lesions, two on the  nose, one on the left cheek were treated with liquid nitrogen in the  usual fashion.  Tolerated well.  Complications:  None.     Georgina Quint. Plotnikov, MD  Electronically Signed    AVP/MedQ  DD: 02/22/2007  DT: 02/22/2007  Job #: [id number]

## 2010-12-06 NOTE — Discharge Summary (Signed)
[name], [name]                 ACCOUNT NO.:  [id number]   MEDICAL RECORD NO.:  [id number]          PATIENT TYPE:  INP   LOCATION:  3020                         FACILITY:  [facility]   PHYSICIAN:  Sean A. Everardo All, MD    DATE OF BIRTH:  November 07, 1925   DATE OF ADMISSION:  10/06/2008  DATE OF DISCHARGE:  10/08/2008                               DISCHARGE SUMMARY   REASON FOR ADMISSION:  Facial weakness.   HISTORY OF PRESENT ILLNESS:  An 77 year old woman admitted by Dr.  Adela Glimpse on October 06, 2008, with facial weakness.  Please refer to her  dictated history and physical for details.   HOSPITAL COURSE:  The patient was admitted, seen by Neurology and worked  up, and a diagnosis of exclusion, of Bell palsy was made.  She felt  better during her hospitalization, especially from the standpoint of  pain.  By October 08, 2008, she was alert and oriented, eating well and  ambulatory, and was thus discharged home.   DIAGNOSES AT THAT TIME OF DISCHARGE:  1. Bell palsy.  2. Otherwise, same as on the history and physical.   MEDICATIONS:  1. Acyclovir 800 mg 5 times a day for 7 days.  2. Medrol Dosepak, as directed.  3. Otherwise, as noted on the history and physical.   DIET:  The patient is advised to chew her food well and eat small bites  until otherwise advised.   ACTIVITY:  Take care not to fall down.  Especially, try not to rush when  you walk-in.   Follow up will be with Dr. Posey Rea within 3 weeks.      Sean A. Everardo All, MD  Electronically Signed     SAE/MEDQ  D:  10/08/2008  T:  10/08/2008  Job:  [id number]

## 2010-12-06 NOTE — Assessment & Plan Note (Signed)
[facility]                                 ON-CALL NOTE   [name], [name]                        MRN:          [id number]  DATE:03/23/2007                            DOB:          [birth year]    [phone]   Patient of Dr. Posey Rea.   Patient calling with constellation of symptoms, unable to weed out what  the issues are.  Go to the office today for evaluation.     Jeffrey A. Tawanna Cooler, MD  Electronically Signed    JAT/MedQ  DD: 03/23/2007  DT: 03/24/2007  Job #: [id number]

## 2010-12-06 NOTE — Consult Note (Signed)
[name], [name]                 ACCOUNT NO.:  [id number]   MEDICAL RECORD NO.:  [id number]          PATIENT TYPE:  INP   LOCATION:                               FACILITY:  [facility]   PHYSICIAN:  [name], M.D.DATE OF BIRTH:  17-Apr-1926   DATE OF CONSULTATION:  10/07/2008  DATE OF DISCHARGE:                                 CONSULTATION   REASON FOR CONSULTATION:  Right facial droop and inability to close the  eye.   HISTORY OF PRESENT ILLNESS:  This is an 77 year old female with CLL who  was receiving IVIG, last IVIG treatment was on Thursday.  At that time,  she states that she felt slightly under the weather, however, did not  have any significant signs or symptoms of shortness of breath, cough,  chest pain.  The patient states that Friday through Monday were  uneventful; however, on Tuesday the patient woke up and noted that she  had difficulty closing her right eye.  The patient wanted to wait and  see if it improve throughout the day.  The patient went to sleep on  Tuesday night and woke up on Wednesday morning.  At this time, she  noticed that she had a right facial droop, was unable to keep water  within her buccal area and was drooling when she was brushing her teeth.  In addition, her right eye still was weak and had difficulty closing.  The patient states she had no decreased sensation throughout her face  and difficulty understanding people or expressing herself with her  verbalization.  The patient had no diplopia, double vision, blurred  vision.  The patient had no ringing in her ears, increased sensation of  sound in her right ear or her left ear.  The patient also states she had  no weakness throughout her body, numbness, or tingling.  The patient at  that time decided to go to the ED where she was seen approximately at  4:00 a.m. on Wednesday morning.   Surgical history includes hysterectomy, ureter construction, bilateral  cataract surgery.   Past  medical history includes hypertension, high cholesterol, pernicious  anemia, CLL for which she is followed by Dr. Truett Perna, edematous colon  polyps, and glaucoma.   Her primary care doctor is Dr. Posey Rea.   Medications include:  1. Aciphex 20 mg daily.  2. Aspirin 81 mg daily.  3. Hydrochlorothiazide 25 mg daily.  4. Maalox p.r.n.  5. Neurontin 300 mg b.i.d.  6. Simvastatin 20 mg daily.  7. Potassium chloride 10 mEq daily b.i.d.  8. Vitamin B12.   Allergies include BIAXIN, DARVOCET, PERCODAN, and VIOXX.   FAMILY HISTORY:  The patient has a son who has had a CVA at the age 51.   SOCIAL HISTORY:  The patient does not smoke, drink, or do illicit drugs.   All 12 review of systems were negative with the exception of above.   PHYSICAL EXAMINATION:  VITAL SIGNS:  Blood pressure is 151/86, pulse 64,  respirations 18, temperature 98.1.  MENTAL STATUS:  She is alert.  She is oriented x3.  Caries out 2- and 3-  step commands.  CARDIOVASCULAR:  S1 and S2.  Regular rate and rhythm.  RESPIRATORY:  Clear to auscultation bilaterally.  No rhonchi or  wheezing.  ABDOMEN:  Soft, nontender, and nondistended.  NECK:  Negative for bruits and supple.  NEUROLOGIC:  Pupils are equal, round, reactive, accommodating to light,  conjugate gaze.  Extraocular muscles are intact.  Visual fields are  intact.  The patient does have difficulty closing her right eyelid  tightly.  She is able to close it so that both eyelids are touching but  is unable to keep her right eyelid closed when attempts are made to open  them.  Face is not symmetrical with a right facial droop.  Tongue is  midline.  Uvula is not midline.  Tonsillar pillars are equal  bilaterally.  Sensation V1-V3 is full to pinprick, light touch.  The  patient has difficulty raising her eyebrow on her right portion of her  face.  Shoulder shrug and head turn is equal bilaterally.  Coordination  from finger-to-nose, heel-to-shin are equal and  smooth.  Fine motor  movements are equal and smooth.  Gait is narrow with normal arm swing  and shifting of weight.  The patient has no difficulty walking on her  toes or her heel or with tandem gait.  Motor is 5/5 in all extremities.  She has normal tone, normal bulk, no tremor.  Deep tendon reflexes are  2+ throughout with downgoing toes.  Drift is negative.  Sensation is  full throughout her upper extremities and lower extremities to pinprick  and light touch.  She does have decreased vibratory sensation in  bilateral lower extremities around the ankles secondary to  polyneuropathy.   LABORATORY DATA:  HbA1c was 5.9.  UA showed a trace of leukocytes.  Sodium 140, potassium 3.6, chloride 105, CO2 27, BUN 11, creatinine  0.86, glucose 95.  White blood cell count is 6.8, platelets 134,  hemoglobin/hematocrit is 14.3 and 42.9.  Triglycerides 86, cholesterol  160, HDL 40, LDL 103.  TSH 3.96.  B12 621.  Folate is greater than 20.  Carotid Doppler showed no ICA stenosis bilaterally.   IMAGING:  CT was negative.  MRI was negative.   ASSESSMENT:  At this time, an 77 year old with a right seventh nerve  palsy diagnosis found on clinical and imaging rounds.   RECOMMENDATIONS:  We would normally start the patient on acyclovir IV  and steroids; however, due to the patient having CLL and being followed  by Dr. Truett Perna, we would run this by Dr. Truett Perna prior to initiating  medications.   Thank you very much for this consultation.     ______________________________  Felicie Morn, PA-C      [name]. Thad Ranger, M.D.  Electronically Signed    DS/MEDQ  D:  10/07/2008  T:  10/08/2008  Job:  [id number]   cc:   Vikki Ports A. Felicity Coyer, MD  [name]. Thad Ranger, M.D.

## 2010-12-06 NOTE — Assessment & Plan Note (Signed)
[facility]                         GASTROENTEROLOGY OFFICE NOTE   [name], [name]                        MRN:          [id number]  DATE:04/04/2007                            DOB:          [birth year]    REFERRING PHYSICIAN:  Georgina Quint. Plotnikov, MD   REASON FOR CONSULTATION:  Gastroesophageal reflux disease and epigastric  pain.   HISTORY OF PRESENT ILLNESS:  This is a 77 year old white female I have  previously followed for history of adenomatous colon polyps.  She  relates a history of heart burn, indigestion, epigastric pain, gas, and  vocal weakness for about 2-3 months.  She was placed on Aciphex with  some improvement in symptoms.  She previously underwent a barium  esophagram in March of 2006 for halitosis which showed cervical  spondylosis with mild extrinsic compression of the esophagus and mild  GERD.  She relates a burning sensation on her tongue and occasional  substernal burning as well despite remaining on Aciphex.  She notes no  dysphagia or odynophagia.  She has some temporary relief with the use of  Maalox x4 daily.  Her last colonoscopy was in October of 2004 and showed  only mild diverticulosis.   FAMILY HISTORY:  Negative for colon cancer, colon polyps or inflammatory  bowel disease.   PAST MEDICAL HISTORY:  1. Hypertension.  2. Pernicious anemia.  3. Hyperlipidemia.  4. CLL.  5. DJD.  6. Adenomatous colon polyps.  7. Diverticulosis.  8. Allergies.  9. Status post hysterectomy.  10.Status post ureter reconstruction.  11.Status post bilateral cataract surgery.  12.Status post L4/L5 laminotomy in 1999.   CURRENT MEDICATIONS:  Listed on the chart - updated and reviewed.   ALLERGIES:  Darrick Grinder, PERCODAN.   SOCIAL HISTORY AND REVIEW OF SYSTEMS:  Per the handwritten form.   PHYSICAL EXAMINATION:  GENERAL:  No acute distress.  VITAL SIGNS:  Height 5 feet, 4 inches. Weight 169.6 pounds. Blood  pressure is  132/64, pulse 68 and regular.  HEENT EXAM:  Reveals a slightly brownish-yellow coating on her tongue  with mild oral erythema. Possible Candida.  NECK:  Without thyromegaly or adenopathy appreciated.  CHEST:  Clear to auscultation bilaterally.  CARDIAC:  Regular rate and rhythm without murmurs appreciated.  ABDOMEN:  Soft, nontender, nondistended, normal active bowel sounds, no  palpable organomegaly, masses or hernias.  NEUROLOGIC:  Alert and oriented x 3.  Grossly nonfocal.   ASSESSMENT AND PLAN:  1. Epigastric pain, substernal burning and oral burning.  Presumed      oral candidiasis.  Possible esophageal candidiasis.  Possible GERD      and ulcer disease. Will increase Aciphex to 20 mg p.o. b.i.d. and      re-intensify all antireflux measures. Begin Mycostatin Swish and      Swallow qid x 10 days. Risks, benefits and alternatives to upper      endoscopy with possible biopsy discussed with the patient and she      consents to proceed.  She states that she will call back to      schedule the procedure.  2. Personal history of adenomatous colon polyps.  After discussion      with the patient we will change her recall colonoscopy to October,      2009.     Venita Lick. Russella Dar, MD, [facility]  Electronically Signed    MTS/MedQ  DD: 04/05/2007  DT: 04/06/2007  Job #: [id number]

## 2010-12-07 ENCOUNTER — Other Ambulatory Visit (INDEPENDENT_AMBULATORY_CARE_PROVIDER_SITE_OTHER): Payer: Medicare Other

## 2010-12-07 ENCOUNTER — Other Ambulatory Visit: Payer: Self-pay | Admitting: Internal Medicine

## 2010-12-07 DIAGNOSIS — E7800 Pure hypercholesterolemia, unspecified: Secondary | ICD-10-CM

## 2010-12-07 LAB — CBC WITH DIFFERENTIAL/PLATELET
Basophils Absolute: 0 10*3/uL (ref 0.0–0.1)
Hemoglobin: 14 g/dL (ref 12.0–15.0)
Lymphocytes Relative: 57.9 % — ABNORMAL HIGH (ref 12.0–46.0)
Monocytes Relative: 5.1 % (ref 3.0–12.0)
Neutro Abs: 2.1 10*3/uL (ref 1.4–7.7)
Platelets: 153 10*3/uL (ref 150.0–400.0)
RDW: 14.4 % (ref 11.5–14.6)

## 2010-12-07 LAB — LIPID PANEL
LDL Cholesterol: 82 mg/dL (ref 0–99)
Total CHOL/HDL Ratio: 3
Triglycerides: 92 mg/dL (ref 0.0–149.0)

## 2010-12-07 LAB — BASIC METABOLIC PANEL
CO2: 33 mEq/L — ABNORMAL HIGH (ref 19–32)
Chloride: 101 mEq/L (ref 96–112)
Creatinine, Ser: 0.9 mg/dL (ref 0.4–1.2)

## 2010-12-13 ENCOUNTER — Encounter: Payer: Self-pay | Admitting: Internal Medicine

## 2010-12-14 ENCOUNTER — Ambulatory Visit (INDEPENDENT_AMBULATORY_CARE_PROVIDER_SITE_OTHER): Payer: Medicare Other | Admitting: Internal Medicine

## 2010-12-14 ENCOUNTER — Encounter: Payer: Self-pay | Admitting: Internal Medicine

## 2010-12-14 VITALS — BP 120/72 | HR 96 | Temp 98.3°F | Wt 163.5 lb

## 2010-12-14 DIAGNOSIS — I10 Essential (primary) hypertension: Secondary | ICD-10-CM

## 2010-12-14 DIAGNOSIS — J309 Allergic rhinitis, unspecified: Secondary | ICD-10-CM

## 2010-12-14 DIAGNOSIS — E538 Deficiency of other specified B group vitamins: Secondary | ICD-10-CM

## 2010-12-14 DIAGNOSIS — R196 Halitosis: Secondary | ICD-10-CM | POA: Insufficient documentation

## 2010-12-14 DIAGNOSIS — R6889 Other general symptoms and signs: Secondary | ICD-10-CM

## 2010-12-14 MED ORDER — AMOXICILLIN 500 MG PO CAPS: 1000.0000 mg | ORAL_CAPSULE | Freq: Two times a day (BID) | ORAL | Status: AC

## 2010-12-14 NOTE — Assessment & Plan Note (Signed)
On Rx 

## 2010-12-14 NOTE — Assessment & Plan Note (Signed)
Empiric Amoxicillin

## 2010-12-14 NOTE — Progress Notes (Signed)
  Subjective:    Patient ID: [name], female    DOB: [birth year], 77 y.o.   MRN: [id number]  HPI    The patient is here to follow up on chronic depression, anxiety, headaches and chronic moderate fibromyalgia symptoms controlled with medicines, diet and exercise.  Review of Systems  Constitutional: Positive for fatigue. Negative for fever and chills.  HENT: Negative for ear pain and mouth sores.   Eyes: Negative for pain.  Respiratory: Negative for cough.   Gastrointestinal: Negative for abdominal pain.  Genitourinary: Negative for flank pain.  Musculoskeletal: Negative for myalgias and joint swelling.  Skin: Negative for rash.  Neurological: Negative for speech difficulty and numbness.  Psychiatric/Behavioral: The patient is not nervous/anxious and is not hyperactive.        Objective:   Physical Exam  Constitutional: She appears well-developed and well-nourished. No distress.  HENT:  Head: Normocephalic.  Right Ear: External ear normal.  Left Ear: External ear normal.  Nose: Nose normal.  Mouth/Throat: Oropharynx is clear and moist.  Eyes: Conjunctivae are normal. Pupils are equal, round, and reactive to light. Right eye exhibits no discharge. Left eye exhibits no discharge.  Neck: Normal range of motion. Neck supple. No JVD present. No tracheal deviation present. No thyromegaly present.  Cardiovascular: Normal rate, regular rhythm and normal heart sounds.   Pulmonary/Chest: No stridor. No respiratory distress. She has no wheezes.  Abdominal: Soft. Bowel sounds are normal. She exhibits no distension and no mass. There is no tenderness. There is no rebound and no guarding.  Musculoskeletal: She exhibits no edema and no tenderness.  Lymphadenopathy:    She has no cervical adenopathy.  Neurological: She displays normal reflexes. No cranial nerve deficit. She exhibits normal muscle tone. Coordination normal.  Skin: No rash noted. No erythema.  Psychiatric: She has a  normal mood and affect. Her behavior is normal. Judgment and thought content normal.          Assessment & Plan:   Halitosis Empiric Amoxicillin   HYPERTENSION On Rx  B12 DEFICIENCY On Rx  ALLERGIC RHINITIS On Rx    Lab Results  Component Value Date   WBC 5.9 12/07/2010   HGB 14.0 12/15/2010   HCT 40.3 12/15/2010   PLT 150 12/15/2010   CHOL 158 12/07/2010   TRIG 92.0 12/07/2010   HDL 57.40 12/07/2010   ALT 20 02/16/2010   AST 25 02/16/2010   NA 140 12/07/2010   K 4.2 12/07/2010   CL 101 12/07/2010   CREATININE 0.9 12/07/2010   BUN 10 12/07/2010   CO2 33* 12/07/2010   TSH 2.20 02/16/2010   INR 1.0 10/07/2008   HGBA1C  Value: 5.9 (NOTE)   The ADA recommends the following therapeutic goal for glycemic   control related to Hgb A1C measurement:   Goal of Therapy:   < 7.0% Hgb A1C   Reference: American Diabetes Association: Clinical Practice   Recommendations 2008, Diabetes Care,  2008, 31:(Suppl 1). 10/07/2008   MICROALBUR 0.4 07/14/2009

## 2010-12-15 ENCOUNTER — Other Ambulatory Visit: Payer: Self-pay | Admitting: Oncology

## 2010-12-15 ENCOUNTER — Encounter (HOSPITAL_BASED_OUTPATIENT_CLINIC_OR_DEPARTMENT_OTHER): Payer: Medicare Other | Admitting: Oncology

## 2010-12-15 DIAGNOSIS — D801 Nonfamilial hypogammaglobulinemia: Secondary | ICD-10-CM

## 2010-12-15 DIAGNOSIS — C9110 Chronic lymphocytic leukemia of B-cell type not having achieved remission: Secondary | ICD-10-CM

## 2010-12-15 LAB — CBC WITH DIFFERENTIAL/PLATELET
Eosinophils Absolute: 0.1 10*3/uL (ref 0.0–0.5)
HCT: 40.3 % (ref 34.8–46.6)
LYMPH%: 54.8 % — ABNORMAL HIGH (ref 14.0–49.7)
MONO#: 0.5 10*3/uL (ref 0.1–0.9)
NEUT#: 2.2 10*3/uL (ref 1.5–6.5)
NEUT%: 35.8 % — ABNORMAL LOW (ref 38.4–76.8)
Platelets: 150 10*3/uL (ref 145–400)
WBC: 6.1 10*3/uL (ref 3.9–10.3)

## 2010-12-19 ENCOUNTER — Encounter: Payer: Self-pay | Admitting: Internal Medicine

## 2011-01-05 ENCOUNTER — Encounter (HOSPITAL_BASED_OUTPATIENT_CLINIC_OR_DEPARTMENT_OTHER): Payer: Medicare Other | Admitting: Oncology

## 2011-01-05 ENCOUNTER — Other Ambulatory Visit: Payer: Self-pay | Admitting: Oncology

## 2011-01-05 DIAGNOSIS — C9110 Chronic lymphocytic leukemia of B-cell type not having achieved remission: Secondary | ICD-10-CM

## 2011-01-05 DIAGNOSIS — D801 Nonfamilial hypogammaglobulinemia: Secondary | ICD-10-CM

## 2011-01-05 LAB — URINALYSIS, MICROSCOPIC - CHCC
Ketones: NEGATIVE mg/dL
Protein: NEGATIVE mg/dL
Specific Gravity, Urine: 1.01 (ref 1.003–1.035)
pH: 6 (ref 4.6–8.0)

## 2011-01-07 LAB — URINE CULTURE

## 2011-01-11 ENCOUNTER — Encounter (HOSPITAL_BASED_OUTPATIENT_CLINIC_OR_DEPARTMENT_OTHER): Payer: Medicare Other | Admitting: Oncology

## 2011-01-11 DIAGNOSIS — N390 Urinary tract infection, site not specified: Secondary | ICD-10-CM

## 2011-01-11 DIAGNOSIS — C9110 Chronic lymphocytic leukemia of B-cell type not having achieved remission: Secondary | ICD-10-CM

## 2011-01-11 DIAGNOSIS — D801 Nonfamilial hypogammaglobulinemia: Secondary | ICD-10-CM

## 2011-01-16 ENCOUNTER — Encounter (HOSPITAL_BASED_OUTPATIENT_CLINIC_OR_DEPARTMENT_OTHER): Payer: Medicare Other | Admitting: Oncology

## 2011-01-16 ENCOUNTER — Other Ambulatory Visit: Payer: Self-pay | Admitting: Oncology

## 2011-01-16 DIAGNOSIS — C9110 Chronic lymphocytic leukemia of B-cell type not having achieved remission: Secondary | ICD-10-CM

## 2011-01-16 DIAGNOSIS — N390 Urinary tract infection, site not specified: Secondary | ICD-10-CM

## 2011-01-16 DIAGNOSIS — D801 Nonfamilial hypogammaglobulinemia: Secondary | ICD-10-CM

## 2011-01-16 LAB — CBC WITH DIFFERENTIAL/PLATELET
Basophils Absolute: 0 10*3/uL (ref 0.0–0.1)
EOS%: 0.8 % (ref 0.0–7.0)
HCT: 40 % (ref 34.8–46.6)
HGB: 13.5 g/dL (ref 11.6–15.9)
MCH: 31.6 pg (ref 25.1–34.0)
MONO#: 0.3 10*3/uL (ref 0.1–0.9)
NEUT#: 3.2 10*3/uL (ref 1.5–6.5)
NEUT%: 50.7 % (ref 38.4–76.8)
RDW: 13.6 % (ref 11.2–14.5)
WBC: 6.3 10*3/uL (ref 3.9–10.3)
lymph#: 2.7 10*3/uL (ref 0.9–3.3)

## 2011-01-16 LAB — URINALYSIS, MICROSCOPIC - CHCC
Bilirubin (Urine): NEGATIVE
Ketones: NEGATIVE mg/dL
Specific Gravity, Urine: 1.005 (ref 1.003–1.035)
pH: 7 (ref 4.6–8.0)

## 2011-01-17 LAB — URINE CULTURE

## 2011-02-08 ENCOUNTER — Encounter (HOSPITAL_BASED_OUTPATIENT_CLINIC_OR_DEPARTMENT_OTHER): Payer: Medicare Other | Admitting: Oncology

## 2011-02-08 DIAGNOSIS — D801 Nonfamilial hypogammaglobulinemia: Secondary | ICD-10-CM

## 2011-02-08 DIAGNOSIS — C9110 Chronic lymphocytic leukemia of B-cell type not having achieved remission: Secondary | ICD-10-CM

## 2011-02-10 ENCOUNTER — Other Ambulatory Visit: Payer: Self-pay | Admitting: Internal Medicine

## 2011-03-07 ENCOUNTER — Telehealth: Payer: Self-pay | Admitting: *Deleted

## 2011-03-07 NOTE — Telephone Encounter (Signed)
Pt has OV 8/24. She left vm asking if she needed to be fasting. Left pt VM that she may have a light breakfast prior.

## 2011-03-08 ENCOUNTER — Encounter (HOSPITAL_BASED_OUTPATIENT_CLINIC_OR_DEPARTMENT_OTHER): Payer: Medicare Other | Admitting: Oncology

## 2011-03-08 DIAGNOSIS — D801 Nonfamilial hypogammaglobulinemia: Secondary | ICD-10-CM

## 2011-03-08 DIAGNOSIS — C9110 Chronic lymphocytic leukemia of B-cell type not having achieved remission: Secondary | ICD-10-CM

## 2011-03-09 ENCOUNTER — Telehealth: Payer: Self-pay | Admitting: *Deleted

## 2011-03-09 MED ORDER — FLUCONAZOLE 150 MG PO TABS: 150.0000 mg | ORAL_TABLET | Freq: Once | ORAL | Status: DC

## 2011-03-09 MED ORDER — CIPROFLOXACIN HCL 250 MG PO TABS: 250.0000 mg | ORAL_TABLET | Freq: Two times a day (BID) | ORAL | Status: AC

## 2011-03-09 NOTE — Telephone Encounter (Signed)
OK - done -- ov if not better Thx

## 2011-03-09 NOTE — Telephone Encounter (Signed)
Pt is requesting rx for ATB and pill for yeast infection-pt states that she has a bladder infection. Pt also wants to know if she should take an extra BP today because her BP yesterday was 150/90 and today was 140/70-please advise

## 2011-03-10 ENCOUNTER — Telehealth: Payer: Self-pay | Admitting: *Deleted

## 2011-03-10 NOTE — Telephone Encounter (Signed)
Spoke w/pt - she wanted to know if it was ok to take additional BP med (hctz 12.5). She took 2 pills today. BP on home wrist machine was 206/110 and then later 163/96. Says she has a "big" wrist and worried it may not be reading correctly. Offered nurse visit apt today to check BP and confirm if machine was reliable. She has no transportation at this time but will come in Monday. Advised to continue to monitor BP and call on call service w/any further concerns while office was closed.

## 2011-03-10 NOTE — Telephone Encounter (Signed)
OK to take additional HCTZ Agree with nnurse or dr visit next wk. Pls let her know about Sat clinic too Thx

## 2011-03-10 NOTE — Telephone Encounter (Signed)
Pt already aware.

## 2011-03-10 NOTE — Telephone Encounter (Signed)
Pt informed of MD's advisement. 

## 2011-03-11 ENCOUNTER — Emergency Department (HOSPITAL_COMMUNITY)
Admission: EM | Admit: 2011-03-11 | Discharge: 2011-03-12 | Disposition: A | Discharge status: Home / Self Care | Payer: Medicare Other | Attending: Emergency Medicine | Admitting: Emergency Medicine

## 2011-03-11 DIAGNOSIS — Z856 Personal history of leukemia: Secondary | ICD-10-CM | POA: Insufficient documentation

## 2011-03-11 DIAGNOSIS — K219 Gastro-esophageal reflux disease without esophagitis: Secondary | ICD-10-CM | POA: Insufficient documentation

## 2011-03-11 DIAGNOSIS — E789 Disorder of lipoprotein metabolism, unspecified: Secondary | ICD-10-CM | POA: Insufficient documentation

## 2011-03-11 DIAGNOSIS — Z79899 Other long term (current) drug therapy: Secondary | ICD-10-CM | POA: Insufficient documentation

## 2011-03-11 DIAGNOSIS — I10 Essential (primary) hypertension: Secondary | ICD-10-CM | POA: Insufficient documentation

## 2011-03-11 LAB — URINALYSIS, ROUTINE W REFLEX MICROSCOPIC
Glucose, UA: NEGATIVE mg/dL
Leukocytes, UA: NEGATIVE
Nitrite: NEGATIVE
Specific Gravity, Urine: 1.008 (ref 1.005–1.030)
pH: 7 (ref 5.0–8.0)

## 2011-03-11 LAB — CBC
Hemoglobin: 14.6 g/dL (ref 12.0–15.0)
Platelets: 155 10*3/uL (ref 150–400)
RBC: 4.61 MIL/uL (ref 3.87–5.11)
WBC: 6.4 10*3/uL (ref 4.0–10.5)

## 2011-03-11 LAB — BASIC METABOLIC PANEL
CO2: 30 mEq/L (ref 19–32)
Chloride: 92 mEq/L — ABNORMAL LOW (ref 96–112)
GFR calc Af Amer: >60 mL/min (ref >60)
Sodium: 131 mEq/L — ABNORMAL LOW (ref 135–145)

## 2011-03-11 LAB — DIFFERENTIAL
Basophils Absolute: 0 10*3/uL (ref 0.0–0.1)
Eosinophils Absolute: 0 10*3/uL (ref 0.0–0.7)
Eosinophils Relative: 1 % (ref 0–5)
Lymphocytes Relative: 48 % — ABNORMAL HIGH (ref 12–46)
Neutrophils Relative %: 44 % (ref 43–77)

## 2011-03-13 ENCOUNTER — Other Ambulatory Visit: Payer: Self-pay | Admitting: Internal Medicine

## 2011-03-13 ENCOUNTER — Telehealth: Payer: Self-pay

## 2011-03-13 NOTE — Telephone Encounter (Signed)
[facility] Triage Call Report Triage Record Num: [id number] Operator: Amy Head Patient Name: [name] Call Date & Time: 03/11/2011 7:53:04PM Patient Phone: [phone] PCP: Sonda Primes Patient Gender: Female PCP Fax : [phone] Patient DOB: 1926/06/05 Practice Name: [facility] Reason for Call: Pt/Myya calling for high blood pressure. States that she cant get the BP machine to work at time of call. Most recent BP 226/105. Feels like "something is pushing her eardrum out and my upper lip is sweating". Afebrile. All emergent sxs per High Blood Pressure protocol r/o. Advised to be seen within 4 hours per guidelines. Protocol(s) Used: Hypertension, Diagnosed or Suspected Recommended Outcome per Protocol: See Provider within 4 hours Reason for Outcome: Systolic blood pressure of more than 180 mmHg OR diastolic blood pressure of more than 120 mmHg Care Advice: ~ Another adult should drive. Call EMS 911 if new symptoms develop, such as severe shortness of breath, chest pain, change in mental status, acute neurologic deficit, seizure, visual disturbances, pulse rate > 120 / minute, or very irregular pulse. ~ ~ Call provider if symptoms worsen or new symptoms develop. ~ HEALTH PROMOTION / MAINTENANCE ~ CAUTIONS ~ List, or take, all current prescription(s), nonprescription or alternative medication(s) to provider for evaluation. Medication Advice: - Discontinue all nonprescription and alternative medications, especially stimulants, until evaluated by provider. - Take prescribed medications as directed, following label instructions for the medication. - Do not change medications or dosing regimen until provider is consulted. - Know possible side effects of medication and what to do if they occur. - Tell provider all prescription, nonprescription or alternative medications that you take ~ 03/11/2011 8:03:25PM Page 1 of 1 [email or web address]

## 2011-03-17 ENCOUNTER — Telehealth: Payer: Self-pay

## 2011-03-17 ENCOUNTER — Encounter: Payer: Self-pay | Admitting: Internal Medicine

## 2011-03-17 ENCOUNTER — Ambulatory Visit (INDEPENDENT_AMBULATORY_CARE_PROVIDER_SITE_OTHER): Payer: Medicare Other | Admitting: Internal Medicine

## 2011-03-17 ENCOUNTER — Other Ambulatory Visit: Payer: Self-pay

## 2011-03-17 VITALS — BP 118/70 | HR 80 | Temp 98.1°F | Resp 16 | Wt 157.0 lb

## 2011-03-17 DIAGNOSIS — F419 Anxiety disorder, unspecified: Secondary | ICD-10-CM | POA: Insufficient documentation

## 2011-03-17 DIAGNOSIS — F411 Generalized anxiety disorder: Secondary | ICD-10-CM

## 2011-03-17 DIAGNOSIS — I10 Essential (primary) hypertension: Secondary | ICD-10-CM

## 2011-03-17 DIAGNOSIS — E785 Hyperlipidemia, unspecified: Secondary | ICD-10-CM

## 2011-03-17 MED ORDER — AMLODIPINE BESYLATE 5 MG PO TABS: 5.0000 mg | ORAL_TABLET | Freq: Every day | ORAL | Status: DC

## 2011-03-17 MED ORDER — CLONAZEPAM 0.5 MG PO TABS: 0.5000 mg | ORAL_TABLET | Freq: Two times a day (BID) | ORAL | Status: AC | PRN

## 2011-03-17 NOTE — Telephone Encounter (Signed)
Patient called stating that she was seen this am and told rx for clonazepam would be faxed in. I advised that I will call verbally. Called and left a verbal on pharmacy VM

## 2011-03-17 NOTE — Assessment & Plan Note (Signed)
See meds 

## 2011-03-17 NOTE — Assessment & Plan Note (Signed)
Cont Rx 

## 2011-03-17 NOTE — Assessment & Plan Note (Signed)
It seems like it was related to stress lately. Better.

## 2011-03-17 NOTE — Progress Notes (Signed)
  Subjective:    Patient ID: [name], female    DOB: Aug 18, 1925, 77 y.o.   MRN: [id number]  HPI  C/o elev BP last wknd with SBP180-100 - went to ER - given Norvasc C/o bad family stress - very anxious  Review of Systems  Constitutional: Positive for fatigue. Negative for chills, activity change, appetite change and unexpected weight change.  HENT: Negative for congestion, mouth sores and sinus pressure.   Eyes: Negative for visual disturbance.  Respiratory: Negative for cough and chest tightness.   Gastrointestinal: Negative for nausea and abdominal pain.  Genitourinary: Negative for frequency, difficulty urinating and vaginal pain.  Musculoskeletal: Negative for back pain and gait problem.  Skin: Negative for pallor and rash.  Neurological: Negative for dizziness, tremors, weakness, numbness and headaches.  Psychiatric/Behavioral: Positive for sleep disturbance. Negative for suicidal ideas and confusion. The patient is nervous/anxious.        Objective:   Physical Exam  Constitutional: She appears well-developed and well-nourished. No distress.  HENT:  Head: Normocephalic.  Right Ear: External ear normal.  Left Ear: External ear normal.  Nose: Nose normal.  Mouth/Throat: Oropharynx is clear and moist.  Eyes: Conjunctivae are normal. Pupils are equal, round, and reactive to light. Right eye exhibits no discharge. Left eye exhibits no discharge.  Neck: Normal range of motion. Neck supple. No JVD present. No tracheal deviation present. No thyromegaly present.  Cardiovascular: Normal rate, regular rhythm and normal heart sounds.   Pulmonary/Chest: No stridor. No respiratory distress. She has no wheezes.  Abdominal: Soft. Bowel sounds are normal. She exhibits no distension and no mass. There is no tenderness. There is no rebound and no guarding.  Musculoskeletal: She exhibits no edema and no tenderness.  Lymphadenopathy:    She has no cervical adenopathy.  Neurological: She  displays normal reflexes. No cranial nerve deficit. She exhibits normal muscle tone. Coordination normal.  Skin: No rash noted. No erythema.  Psychiatric: She has a normal mood and affect. Her behavior is normal. Judgment and thought content normal.     ER notes reviewed     Assessment & Plan:

## 2011-04-05 ENCOUNTER — Encounter (HOSPITAL_BASED_OUTPATIENT_CLINIC_OR_DEPARTMENT_OTHER): Payer: Medicare Other | Admitting: Oncology

## 2011-04-05 DIAGNOSIS — D801 Nonfamilial hypogammaglobulinemia: Secondary | ICD-10-CM

## 2011-04-05 DIAGNOSIS — C9110 Chronic lymphocytic leukemia of B-cell type not having achieved remission: Secondary | ICD-10-CM

## 2011-04-28 ENCOUNTER — Encounter: Payer: Self-pay | Admitting: *Deleted

## 2011-05-03 ENCOUNTER — Encounter (HOSPITAL_BASED_OUTPATIENT_CLINIC_OR_DEPARTMENT_OTHER): Payer: 59 | Admitting: Oncology

## 2011-05-03 DIAGNOSIS — Z23 Encounter for immunization: Secondary | ICD-10-CM

## 2011-05-03 DIAGNOSIS — D801 Nonfamilial hypogammaglobulinemia: Secondary | ICD-10-CM

## 2011-05-03 DIAGNOSIS — C9110 Chronic lymphocytic leukemia of B-cell type not having achieved remission: Secondary | ICD-10-CM

## 2011-05-08 LAB — I-STAT 8, (EC8 V) (CONVERTED LAB)
Acid-Base Excess: 4 — ABNORMAL HIGH
BUN: 10
Bicarbonate: 24.8 — ABNORMAL HIGH
Chloride: 101
Glucose, Bld: 121 — ABNORMAL HIGH
HCT: 46
Hemoglobin: 15.6 — ABNORMAL HIGH
Operator id: 272551
Potassium: 3.3 — ABNORMAL LOW
Sodium: 134 — ABNORMAL LOW
TCO2: 26
pCO2, Ven: 27.1 — ABNORMAL LOW
pH, Ven: 7.57 — ABNORMAL HIGH

## 2011-05-08 LAB — POCT CARDIAC MARKERS
CKMB, poc: <1 — ABNORMAL LOW
CKMB, poc: <1 — ABNORMAL LOW
Myoglobin, poc: 113
Myoglobin, poc: 51.6
Operator id: 272551
Operator id: 291361
Troponin i, poc: <0.05
Troponin i, poc: <0.05

## 2011-05-08 LAB — HEPATIC FUNCTION PANEL
Alkaline Phosphatase: 52
Indirect Bilirubin: 0.6
Total Bilirubin: 0.7
Total Protein: 6.8

## 2011-05-08 LAB — URINALYSIS, ROUTINE W REFLEX MICROSCOPIC
Hgb urine dipstick: NEGATIVE
Nitrite: NEGATIVE
Protein, ur: NEGATIVE
Specific Gravity, Urine: 1.01
Urobilinogen, UA: 0.2

## 2011-05-08 LAB — POCT I-STAT CREATININE
Creatinine, Ser: 1
Operator id: 272551

## 2011-05-08 LAB — LIPASE, BLOOD: Lipase: 25

## 2011-05-16 ENCOUNTER — Other Ambulatory Visit: Payer: Self-pay | Admitting: Internal Medicine

## 2011-05-27 ENCOUNTER — Telehealth: Payer: Self-pay | Admitting: Oncology

## 2011-05-27 ENCOUNTER — Other Ambulatory Visit: Payer: Self-pay | Admitting: Oncology

## 2011-05-27 DIAGNOSIS — C9110 Chronic lymphocytic leukemia of B-cell type not having achieved remission: Secondary | ICD-10-CM

## 2011-05-27 NOTE — Telephone Encounter (Signed)
S/w the pt and she is aware to stop by the scheduling desk to pick up the rest of her appts for dec and jan.

## 2011-05-30 ENCOUNTER — Other Ambulatory Visit: Payer: Self-pay | Admitting: Internal Medicine

## 2011-06-01 ENCOUNTER — Other Ambulatory Visit: Payer: Self-pay | Admitting: Oncology

## 2011-06-01 ENCOUNTER — Other Ambulatory Visit (HOSPITAL_BASED_OUTPATIENT_CLINIC_OR_DEPARTMENT_OTHER): Payer: Medicare Other | Admitting: Lab

## 2011-06-01 ENCOUNTER — Ambulatory Visit (HOSPITAL_BASED_OUTPATIENT_CLINIC_OR_DEPARTMENT_OTHER): Payer: Medicare Other

## 2011-06-01 VITALS — BP 124/67 | HR 60 | Temp 97.8°F | Ht 64.0 in | Wt 164.0 lb

## 2011-06-01 DIAGNOSIS — D801 Nonfamilial hypogammaglobulinemia: Secondary | ICD-10-CM

## 2011-06-01 DIAGNOSIS — C9110 Chronic lymphocytic leukemia of B-cell type not having achieved remission: Secondary | ICD-10-CM

## 2011-06-01 LAB — CBC WITH DIFFERENTIAL/PLATELET
Basophils Absolute: 0 10*3/uL (ref 0.0–0.1)
EOS%: 1.7 % (ref 0.0–7.0)
HCT: 41.1 % (ref 34.8–46.6)
HGB: 14.2 g/dL (ref 11.6–15.9)
LYMPH%: 50 % — ABNORMAL HIGH (ref 14.0–49.7)
MCH: 31 pg (ref 25.1–34.0)
MCHC: 34.5 g/dL (ref 31.5–36.0)
MCV: 89.7 fL (ref 79.5–101.0)
MONO%: 6.1 % (ref 0.0–14.0)
NEUT%: 42 % (ref 38.4–76.8)

## 2011-06-01 MED ORDER — IMMUNE GLOBULIN (HUMAN) 10 GM/100ML IV SOLN
0.4000 g/kg | Freq: Once | INTRAVENOUS | Status: AC
  Administered 2011-06-01: 30 g via INTRAVENOUS
  Filled 2011-06-01: qty 300

## 2011-06-01 MED ORDER — DIPHENHYDRAMINE HCL 25 MG PO TABS
25.0000 mg | ORAL_TABLET | Freq: Once | ORAL | Status: AC
  Administered 2011-06-01: 25 mg via ORAL
  Filled 2011-06-01: qty 1

## 2011-06-01 MED ORDER — ACETAMINOPHEN 325 MG PO TABS
650.0000 mg | ORAL_TABLET | Freq: Four times a day (QID) | ORAL | Status: DC | PRN
  Administered 2011-06-01: 650 mg via ORAL

## 2011-06-01 MED ORDER — SODIUM CHLORIDE 0.9 % IV SOLN
Freq: Once | INTRAVENOUS | Status: AC
  Administered 2011-06-01: 11:00:00 via INTRAVENOUS

## 2011-06-01 NOTE — Progress Notes (Signed)
1220- VSS. Privigen started at 22 ml/hr x 11 ml. 1250- VSS. Rate increased to 44 ml/hr x 22 ml. 1320- VSS. Rate increased to 88 ml/hr x 44 ml. 1350- VSS. Rate increased to 177 ml/hr x 88 ml. 1420- VSS. Rate increased to 355 ml/hr x 177 ml. 1455- VSS. Privigen infusion complete. Pt tolerated well.

## 2011-06-01 NOTE — Patient Instructions (Signed)
1500-Pt discharged ambulatory with next appointment confirmed.  Pt aware to call with any questions or concerns.

## 2011-06-02 ENCOUNTER — Other Ambulatory Visit: Payer: Self-pay | Admitting: Internal Medicine

## 2011-06-21 ENCOUNTER — Encounter: Payer: Self-pay | Admitting: Internal Medicine

## 2011-06-21 ENCOUNTER — Ambulatory Visit (INDEPENDENT_AMBULATORY_CARE_PROVIDER_SITE_OTHER): Payer: Medicare Other | Admitting: Internal Medicine

## 2011-06-21 DIAGNOSIS — I10 Essential (primary) hypertension: Secondary | ICD-10-CM

## 2011-06-21 DIAGNOSIS — E538 Deficiency of other specified B group vitamins: Secondary | ICD-10-CM

## 2011-06-21 DIAGNOSIS — R6889 Other general symptoms and signs: Secondary | ICD-10-CM

## 2011-06-21 DIAGNOSIS — C9110 Chronic lymphocytic leukemia of B-cell type not having achieved remission: Secondary | ICD-10-CM

## 2011-06-21 DIAGNOSIS — R196 Halitosis: Secondary | ICD-10-CM

## 2011-06-21 DIAGNOSIS — E785 Hyperlipidemia, unspecified: Secondary | ICD-10-CM

## 2011-06-21 NOTE — Assessment & Plan Note (Signed)
Continue with current f/u visits

## 2011-06-21 NOTE — Progress Notes (Signed)
  Subjective:    Patient ID: [name], female    DOB: [birth year], 77 y.o.   MRN: [id number]  HPI   The patient presents for a follow-up of  chronic hypertension, chronic dyslipidemia, anxiety (mild) controlled with medicines; halitosis   Review of Systems  Constitutional: Positive for fatigue. Negative for fever, chills, diaphoresis, activity change, appetite change and unexpected weight change.  HENT: Negative for hearing loss, ear pain, nosebleeds, congestion, sore throat, facial swelling, rhinorrhea, sneezing, mouth sores, trouble swallowing, neck pain, neck stiffness, postnasal drip, sinus pressure and tinnitus.   Eyes: Negative for pain, discharge, redness, itching and visual disturbance.  Respiratory: Negative for cough, chest tightness, shortness of breath, wheezing and stridor.   Cardiovascular: Negative for chest pain, palpitations and leg swelling.  Gastrointestinal: Negative for nausea, diarrhea, constipation, blood in stool, abdominal distention, anal bleeding and rectal pain.  Genitourinary: Negative for dysuria, urgency, frequency, hematuria, flank pain, vaginal bleeding, vaginal discharge, difficulty urinating, genital sores and pelvic pain.  Musculoskeletal: Negative for back pain, joint swelling, arthralgias and gait problem.  Skin: Negative.  Negative for rash.  Neurological: Negative for dizziness, tremors, seizures, syncope, speech difficulty, weakness, numbness and headaches.  Hematological: Negative for adenopathy. Does not bruise/bleed easily.  Psychiatric/Behavioral: Negative for suicidal ideas, behavioral problems, sleep disturbance, dysphoric mood and decreased concentration. The patient is nervous/anxious.        Objective:   Physical Exam  Constitutional: She appears well-developed. No distress.       obese  HENT:  Head: Normocephalic.  Right Ear: External ear normal.  Left Ear: External ear normal.  Nose: Nose normal.  Mouth/Throat: Oropharynx is  clear and moist.       No halitosis  Eyes: Conjunctivae are normal. Pupils are equal, round, and reactive to light. Right eye exhibits no discharge. Left eye exhibits no discharge.  Neck: Normal range of motion. Neck supple. No JVD present. No tracheal deviation present. No thyromegaly present.  Cardiovascular: Normal rate, regular rhythm and normal heart sounds.   Pulmonary/Chest: No stridor. No respiratory distress. She has no wheezes.  Abdominal: Soft. Bowel sounds are normal. She exhibits no distension and no mass. There is no tenderness. There is no rebound and no guarding.  Musculoskeletal: She exhibits no edema and no tenderness.  Lymphadenopathy:    She has no cervical adenopathy.  Neurological: She displays normal reflexes. No cranial nerve deficit. She exhibits normal muscle tone. Coordination normal.  Skin: No rash noted. No erythema.  Psychiatric: She has a normal mood and affect. Her behavior is normal. Judgment and thought content normal.          Assessment & Plan:

## 2011-06-21 NOTE — Assessment & Plan Note (Signed)
Continue with current prescription therapy as reflected on the Med list.  

## 2011-06-21 NOTE — Assessment & Plan Note (Signed)
Discussed.

## 2011-06-28 ENCOUNTER — Other Ambulatory Visit: Payer: Self-pay | Admitting: Oncology

## 2011-06-29 ENCOUNTER — Other Ambulatory Visit: Payer: Self-pay | Admitting: Oncology

## 2011-06-29 ENCOUNTER — Ambulatory Visit (HOSPITAL_BASED_OUTPATIENT_CLINIC_OR_DEPARTMENT_OTHER): Payer: Medicare Other

## 2011-06-29 VITALS — BP 136/78 | HR 58 | Temp 98.1°F

## 2011-06-29 DIAGNOSIS — C9110 Chronic lymphocytic leukemia of B-cell type not having achieved remission: Secondary | ICD-10-CM

## 2011-06-29 DIAGNOSIS — D801 Nonfamilial hypogammaglobulinemia: Secondary | ICD-10-CM

## 2011-06-29 MED ORDER — DIPHENHYDRAMINE HCL 25 MG PO TABS
25.0000 mg | ORAL_TABLET | Freq: Once | ORAL | Status: AC
  Administered 2011-06-29: 25 mg via ORAL
  Filled 2011-06-29: qty 1

## 2011-06-29 MED ORDER — IMMUNE GLOBULIN (HUMAN) 10 GM/100ML IV SOLN
30.0000 g | INTRAVENOUS | Status: DC
  Administered 2011-06-29: 30 g via INTRAVENOUS
  Filled 2011-06-29: qty 300

## 2011-06-29 MED ORDER — ACETAMINOPHEN 325 MG PO TABS
650.0000 mg | ORAL_TABLET | Freq: Once | ORAL | Status: AC
  Administered 2011-06-29: 650 mg via ORAL

## 2011-06-29 NOTE — Patient Instructions (Signed)
1250-Pt discharged ambulatory with next appointment confirmed.  Pt aware to call with any questions or concerns.  

## 2011-07-08 ENCOUNTER — Other Ambulatory Visit: Payer: Self-pay | Admitting: Internal Medicine

## 2011-07-31 ENCOUNTER — Other Ambulatory Visit: Payer: Self-pay | Admitting: Oncology

## 2011-08-01 ENCOUNTER — Other Ambulatory Visit: Payer: Self-pay | Admitting: *Deleted

## 2011-08-01 ENCOUNTER — Ambulatory Visit: Payer: Medicare Other | Admitting: Oncology

## 2011-08-01 ENCOUNTER — Other Ambulatory Visit: Payer: Self-pay | Admitting: Oncology

## 2011-08-01 ENCOUNTER — Other Ambulatory Visit: Payer: Medicare Other | Admitting: Lab

## 2011-08-01 ENCOUNTER — Ambulatory Visit (HOSPITAL_BASED_OUTPATIENT_CLINIC_OR_DEPARTMENT_OTHER): Payer: Medicare Other

## 2011-08-01 VITALS — BP 122/70 | HR 59 | Temp 97.0°F

## 2011-08-01 VITALS — BP 130/83 | HR 70 | Temp 97.7°F | Ht 64.0 in | Wt 156.0 lb

## 2011-08-01 DIAGNOSIS — C9110 Chronic lymphocytic leukemia of B-cell type not having achieved remission: Secondary | ICD-10-CM

## 2011-08-01 LAB — CBC WITH DIFFERENTIAL/PLATELET
BASO%: 0.6 % (ref 0.0–2.0)
EOS%: 1.2 % (ref 0.0–7.0)
Eosinophils Absolute: 0.1 10*3/uL (ref 0.0–0.5)
LYMPH%: 49.8 % — ABNORMAL HIGH (ref 14.0–49.7)
MCH: 31.8 pg (ref 25.1–34.0)
MCHC: 34.3 g/dL (ref 31.5–36.0)
MCV: 92.6 fL (ref 79.5–101.0)
MONO%: 5.2 % (ref 0.0–14.0)
Platelets: 151 10*3/uL (ref 145–400)
RBC: 4.49 10*6/uL (ref 3.70–5.45)

## 2011-08-01 MED ORDER — SODIUM CHLORIDE 0.9 % IJ SOLN
3.0000 mL | Freq: Once | INTRAMUSCULAR | Status: DC | PRN
  Filled 2011-08-01: qty 10

## 2011-08-01 MED ORDER — DIPHENHYDRAMINE HCL 25 MG PO TABS
25.0000 mg | ORAL_TABLET | Freq: Once | ORAL | Status: AC
  Administered 2011-08-01: 25 mg via ORAL
  Filled 2011-08-01: qty 1

## 2011-08-01 MED ORDER — IMMUNE GLOBULIN (HUMAN) 10 GM/100ML IV SOLN
30.0000 g | INTRAVENOUS | Status: DC
  Administered 2011-08-01: 30 g via INTRAVENOUS
  Filled 2011-08-01: qty 300

## 2011-08-01 MED ORDER — ACETAMINOPHEN 325 MG PO TABS
650.0000 mg | ORAL_TABLET | Freq: Once | ORAL | Status: AC
  Administered 2011-08-01: 650 mg via ORAL

## 2011-08-01 NOTE — Progress Notes (Signed)
1105: IVIG started at 21 mL/hr X 11 mL 1135: IVIG increased to 42 mL/hr X 21 mL 1205: IVIG increased to 85 mL/hr X 42 mL. 1235: IVIG increased to 170 mL/hr X 85 mL 1305: IVIG increased to 340 mL/hr X 170 mL 1335: IVIG stopped.

## 2011-08-01 NOTE — Patient Instructions (Signed)
Patient ambulatory out of clinic without complaints.  Instructed patient to call with any issues.  Patient aware of next appointment 

## 2011-08-03 NOTE — Progress Notes (Signed)
OFFICE PROGRESS NOTE   INTERVAL HISTORY:   She returns as scheduled. She continues monthly IVIG therapy. There have been no recent infections. She has no complaint today.  Objective:  Vital signs in last 24 hours:  Blood pressure 130/83, pulse 70, temperature 97.7 F (36.5 C), temperature source Oral, height 5\' 4"  (1.626 m), weight 156 lb (70.761 kg).    HEENT: Neck without mass Lymphatics: No cervical, supraclavicular, or axillary nodes Resp: Lungs clear bilaterally Cardio: Regular rate and rhythm GI: No hepatosplenomegaly Vascular: No leg edema      Lab Results:  Lab Results  Component Value Date   WBC 6.4 08/01/2011   HGB 14.3 08/01/2011   HCT 41.6 08/01/2011   MCV 92.6 08/01/2011   PLT 151 08/01/2011   ANC-2.8   Medications: I have reviewed the patient's current medications.  Assessment/Plan: 1. Chronic lymphocytic leukemia, asymptomatic. 2. History of hypogammaglobulinemia with recurrent infections.  She continues monthly IVIG replacement therapy. 3.     History of Recurrent urinary tract infections.    Disposition: She appears stable. The plan is to continue monthly IVIG therapy. She is scheduled for a 6 month office visit.    Lucile Shutters, MD  08/03/2011  8:15 AM

## 2011-08-07 ENCOUNTER — Other Ambulatory Visit: Payer: Self-pay

## 2011-08-07 MED ORDER — HYDROCHLOROTHIAZIDE 12.5 MG PO CAPS: ORAL_CAPSULE | ORAL | Status: DC

## 2011-08-09 ENCOUNTER — Other Ambulatory Visit: Payer: Self-pay | Admitting: *Deleted

## 2011-08-09 MED ORDER — OMEPRAZOLE 40 MG PO CPDR: 40.0000 mg | DELAYED_RELEASE_CAPSULE | Freq: Every day | ORAL | Status: DC

## 2011-08-21 ENCOUNTER — Other Ambulatory Visit: Payer: Self-pay | Admitting: *Deleted

## 2011-08-21 MED ORDER — OMEPRAZOLE 40 MG PO CPDR: 40.0000 mg | DELAYED_RELEASE_CAPSULE | Freq: Every day | ORAL | Status: DC

## 2011-08-30 ENCOUNTER — Ambulatory Visit (HOSPITAL_BASED_OUTPATIENT_CLINIC_OR_DEPARTMENT_OTHER): Payer: Medicare Other

## 2011-08-30 VITALS — BP 129/74 | HR 70 | Temp 97.4°F

## 2011-08-30 DIAGNOSIS — C9110 Chronic lymphocytic leukemia of B-cell type not having achieved remission: Secondary | ICD-10-CM

## 2011-08-30 MED ORDER — IMMUNE GLOBULIN (HUMAN) 10 GM/100ML IV SOLN
30.0000 g | INTRAVENOUS | Status: DC
  Administered 2011-08-30: 30 g via INTRAVENOUS
  Filled 2011-08-30: qty 300

## 2011-08-30 MED ORDER — ACETAMINOPHEN 325 MG PO TABS
650.0000 mg | ORAL_TABLET | Freq: Once | ORAL | Status: AC
  Administered 2011-08-30: 650 mg via ORAL

## 2011-08-30 MED ORDER — DIPHENHYDRAMINE HCL 25 MG PO TABS
25.0000 mg | ORAL_TABLET | Freq: Once | ORAL | Status: AC
  Administered 2011-08-30: 25 mg via ORAL
  Filled 2011-08-30: qty 1

## 2011-09-04 ENCOUNTER — Other Ambulatory Visit: Payer: Self-pay | Admitting: *Deleted

## 2011-09-04 MED ORDER — SIMVASTATIN 20 MG PO TABS: 20.0000 mg | ORAL_TABLET | Freq: Every day | ORAL | Status: DC

## 2011-09-12 ENCOUNTER — Other Ambulatory Visit: Payer: Self-pay | Admitting: *Deleted

## 2011-09-12 MED ORDER — AMLODIPINE BESYLATE 5 MG PO TABS: 5.0000 mg | ORAL_TABLET | Freq: Every day | ORAL | Status: DC

## 2011-09-25 ENCOUNTER — Other Ambulatory Visit (INDEPENDENT_AMBULATORY_CARE_PROVIDER_SITE_OTHER): Payer: Medicare Other

## 2011-09-25 ENCOUNTER — Telehealth: Payer: Self-pay | Admitting: *Deleted

## 2011-09-25 ENCOUNTER — Ambulatory Visit (INDEPENDENT_AMBULATORY_CARE_PROVIDER_SITE_OTHER): Payer: Medicare Other | Admitting: Internal Medicine

## 2011-09-25 ENCOUNTER — Encounter: Payer: Self-pay | Admitting: Internal Medicine

## 2011-09-25 VITALS — BP 110/68 | HR 72 | Temp 99.1°F | Resp 16 | Wt 153.0 lb

## 2011-09-25 DIAGNOSIS — N3090 Cystitis, unspecified without hematuria: Secondary | ICD-10-CM

## 2011-09-25 LAB — URINALYSIS, ROUTINE W REFLEX MICROSCOPIC
Bilirubin Urine: NEGATIVE
Nitrite: NEGATIVE
Total Protein, Urine: NEGATIVE

## 2011-09-25 MED ORDER — CIPROFLOXACIN HCL 500 MG PO TABS: 500.0000 mg | ORAL_TABLET | Freq: Two times a day (BID) | ORAL | Status: AC

## 2011-09-25 MED ORDER — FLUCONAZOLE 150 MG PO TABS: 150.0000 mg | ORAL_TABLET | Freq: Once | ORAL | Status: AC

## 2011-09-25 NOTE — Patient Instructions (Signed)

## 2011-09-25 NOTE — Telephone Encounter (Signed)
Pt left vm req rx for bladder infection. I advised her AVP is out of office. OV scheduled with Dr. Yetta Barre today at 4 pm. UA ordered- pt aware to go to lab prior to OV.

## 2011-09-25 NOTE — Assessment & Plan Note (Signed)
Her UA is + for [facility] and the clx is pending so I stared her on cipro and diflucan

## 2011-09-25 NOTE — Progress Notes (Signed)
  Subjective:    Patient ID: [name], female    DOB: 1925-12-12, 78 y.o.   MRN: [id number]  Urinary Tract Infection  This is a recurrent problem. The current episode started in the past 7 days. The problem occurs every urination. The problem has been gradually worsening. The quality of the pain is described as burning. The pain is at a severity of 1/10. The pain is mild. There has been no fever. The fever has been present for less than 1 day. She is not sexually active. There is no history of pyelonephritis. Associated symptoms include frequency and urgency. Pertinent negatives include no chills, discharge, flank pain, hematuria, hesitancy, nausea, possible pregnancy, sweats or vomiting. She has tried home medications for the symptoms. The treatment provided no relief. Her past medical history is significant for recurrent UTIs.      Review of Systems  Constitutional: Negative for fever, chills, diaphoresis, activity change, appetite change, fatigue and unexpected weight change.  HENT: Negative.   Eyes: Negative.   Respiratory: Negative for cough, chest tightness, shortness of breath, wheezing and stridor.   Cardiovascular: Negative for chest pain, palpitations and leg swelling.  Gastrointestinal: Negative for nausea, vomiting, abdominal pain, diarrhea, constipation, blood in stool and abdominal distention.  Genitourinary: Positive for dysuria, urgency and frequency. Negative for hesitancy, hematuria, flank pain, decreased urine volume, vaginal bleeding, vaginal discharge, enuresis, difficulty urinating, genital sores, vaginal pain, menstrual problem, pelvic pain and dyspareunia.  Musculoskeletal: Negative for myalgias, back pain, joint swelling, arthralgias and gait problem.  Skin: Negative for color change, pallor, rash and wound.  Neurological: Negative.   Hematological: Negative for adenopathy. Does not bruise/bleed easily.  Psychiatric/Behavioral: Negative.        Objective:   Physical Exam  Vitals reviewed. Constitutional: She is oriented to person, place, and time. She appears well-developed and well-nourished. No distress.  HENT:  Head: Normocephalic and atraumatic.  Mouth/Throat: Oropharynx is clear and moist. No oropharyngeal exudate.  Eyes: Conjunctivae are normal. Right eye exhibits no discharge. Left eye exhibits no discharge. No scleral icterus.  Neck: Normal range of motion. Neck supple. No JVD present. No tracheal deviation present. No thyromegaly present.  Cardiovascular: Normal rate, regular rhythm, normal heart sounds and intact distal pulses.  Exam reveals no gallop and no friction rub.   No murmur heard. Pulmonary/Chest: Effort normal and breath sounds normal. No stridor. No respiratory distress. She has no wheezes. She has no rales. She exhibits no tenderness.  Abdominal: Soft. Bowel sounds are normal. She exhibits no distension and no mass. There is no hepatosplenomegaly. There is no tenderness. There is no rebound, no guarding and no CVA tenderness.  Musculoskeletal: Normal range of motion. She exhibits no edema and no tenderness.  Lymphadenopathy:    She has no cervical adenopathy.  Neurological: She is oriented to person, place, and time.  Skin: Skin is warm and dry. No rash noted. She is not diaphoretic. No erythema. No pallor.  Psychiatric: She has a normal mood and affect. Her behavior is normal. Judgment and thought content normal.          Assessment & Plan:

## 2011-09-26 ENCOUNTER — Telehealth: Payer: Self-pay | Admitting: *Deleted

## 2011-09-26 NOTE — Telephone Encounter (Signed)
Patient left message that she was returning call regarding the reschedule of an appointment. Forwarded message to infusion room scheduler.

## 2011-09-27 ENCOUNTER — Ambulatory Visit (HOSPITAL_BASED_OUTPATIENT_CLINIC_OR_DEPARTMENT_OTHER): Payer: Medicare Other

## 2011-09-27 ENCOUNTER — Ambulatory Visit: Payer: Medicare Other | Admitting: Internal Medicine

## 2011-09-27 VITALS — BP 143/75 | HR 62 | Temp 96.3°F

## 2011-09-27 DIAGNOSIS — C9110 Chronic lymphocytic leukemia of B-cell type not having achieved remission: Secondary | ICD-10-CM

## 2011-09-27 MED ORDER — DIPHENHYDRAMINE HCL 25 MG PO TABS
25.0000 mg | ORAL_TABLET | Freq: Once | ORAL | Status: AC
  Administered 2011-09-27: 25 mg via ORAL
  Filled 2011-09-27: qty 1

## 2011-09-27 MED ORDER — ACETAMINOPHEN 325 MG PO TABS
650.0000 mg | ORAL_TABLET | Freq: Once | ORAL | Status: AC
  Administered 2011-09-27: 650 mg via ORAL

## 2011-09-27 MED ORDER — IMMUNE GLOBULIN (HUMAN) 10 GM/100ML IV SOLN
30.0000 g | INTRAVENOUS | Status: DC
  Administered 2011-09-27: 30 g via INTRAVENOUS
  Filled 2011-09-27: qty 300

## 2011-09-27 MED ORDER — SODIUM CHLORIDE 0.9 % IJ SOLN
10.0000 mL | INTRAMUSCULAR | Status: DC | PRN
  Filled 2011-09-27: qty 10

## 2011-09-27 NOTE — Patient Instructions (Signed)
Please call your physician for any problems.  Followup as scheduled.

## 2011-09-29 ENCOUNTER — Telehealth: Payer: Self-pay | Admitting: *Deleted

## 2011-09-29 NOTE — Telephone Encounter (Signed)
Lab did not run culture [becuase it was placed 30 minutes later than the U/A]; U/A only shows Hgb & leukocytes, which was treated with Cipro. Patient would like to know results of culture; what would you advise telling the patient.?

## 2011-09-29 NOTE — Telephone Encounter (Signed)
Pt left vm req urine culture results. I called our [facility] lab and they advised me to call [facility] at [phone] gave them accnt # [id number]. Spoke to a rep who states she cant find pt in her system.

## 2011-10-02 ENCOUNTER — Other Ambulatory Visit: Payer: Self-pay | Admitting: *Deleted

## 2011-10-02 NOTE — Telephone Encounter (Signed)
LMOM to Inform patient w/contact name & number for call back; lab orders placed.

## 2011-10-02 NOTE — Telephone Encounter (Signed)
Repeat UA and Cx in 1 mo pls Thx

## 2011-10-18 ENCOUNTER — Ambulatory Visit (INDEPENDENT_AMBULATORY_CARE_PROVIDER_SITE_OTHER): Payer: Medicare Other | Admitting: Internal Medicine

## 2011-10-18 ENCOUNTER — Encounter: Payer: Self-pay | Admitting: Internal Medicine

## 2011-10-18 VITALS — BP 148/88 | HR 76 | Temp 98.2°F | Resp 16 | Wt 152.0 lb

## 2011-10-18 DIAGNOSIS — R6889 Other general symptoms and signs: Secondary | ICD-10-CM

## 2011-10-18 DIAGNOSIS — J309 Allergic rhinitis, unspecified: Secondary | ICD-10-CM

## 2011-10-18 DIAGNOSIS — E785 Hyperlipidemia, unspecified: Secondary | ICD-10-CM

## 2011-10-18 DIAGNOSIS — E538 Deficiency of other specified B group vitamins: Secondary | ICD-10-CM

## 2011-10-18 DIAGNOSIS — R196 Halitosis: Secondary | ICD-10-CM

## 2011-10-18 DIAGNOSIS — R5381 Other malaise: Secondary | ICD-10-CM

## 2011-10-18 DIAGNOSIS — R5383 Other fatigue: Secondary | ICD-10-CM

## 2011-10-18 DIAGNOSIS — F411 Generalized anxiety disorder: Secondary | ICD-10-CM

## 2011-10-18 DIAGNOSIS — F419 Anxiety disorder, unspecified: Secondary | ICD-10-CM

## 2011-10-18 DIAGNOSIS — I10 Essential (primary) hypertension: Secondary | ICD-10-CM

## 2011-10-18 MED ORDER — LORATADINE 10 MG PO TABS: 10.0000 mg | ORAL_TABLET | Freq: Every day | ORAL | Status: DC

## 2011-10-18 NOTE — Assessment & Plan Note (Signed)
Continue with current prescription therapy as reflected on the Med list.  

## 2011-10-18 NOTE — Assessment & Plan Note (Signed)
Resolved

## 2011-10-18 NOTE — Patient Instructions (Signed)
Wt Readings from Last 3 Encounters:  10/18/11 152 lb (68.947 kg)  09/25/11 153 lb (69.4 kg)  08/01/11 156 lb (70.761 kg)   BP Readings from Last 3 Encounters:  10/18/11 148/88  09/27/11 143/75  09/25/11 110/68

## 2011-10-18 NOTE — Assessment & Plan Note (Addendum)
Continue with current prescription therapy as reflected on the Med list.  

## 2011-10-18 NOTE — Assessment & Plan Note (Signed)
Continue with current prescription therapy as reflected on the Med list - shots q 1 mo

## 2011-10-18 NOTE — Progress Notes (Signed)
Patient ID: [name], female   DOB: 11/03/1925, 78 y.o.   MRN: [id number]  Subjective:    Patient ID: [name], female    DOB: 10/18/1925, 78 y.o.   MRN: [id number]  HPI   The patient presents for a follow-up of  chronic hypertension, chronic dyslipidemia, anxiety (mild) controlled with medicines; halitosis. BP is good home. Her 2nd son had a CVA  BP Readings from Last 3 Encounters:  10/18/11 148/88  09/27/11 143/75  09/25/11 110/68   Wt Readings from Last 3 Encounters:  10/18/11 152 lb (68.947 kg)  09/25/11 153 lb (69.4 kg)  08/01/11 156 lb (70.761 kg)       Review of Systems  Constitutional: Positive for fatigue. Negative for fever, chills, diaphoresis, activity change, appetite change and unexpected weight change.  HENT: Negative for hearing loss, ear pain, nosebleeds, congestion, sore throat, facial swelling, rhinorrhea, sneezing, mouth sores, trouble swallowing, neck pain, neck stiffness, postnasal drip, sinus pressure and tinnitus.   Eyes: Negative for pain, discharge, redness, itching and visual disturbance.  Respiratory: Negative for cough, chest tightness, shortness of breath, wheezing and stridor.   Cardiovascular: Negative for chest pain, palpitations and leg swelling.  Gastrointestinal: Negative for nausea, diarrhea, constipation, blood in stool, abdominal distention, anal bleeding and rectal pain.  Genitourinary: Negative for dysuria, urgency, frequency, hematuria, flank pain, vaginal bleeding, vaginal discharge, difficulty urinating, genital sores and pelvic pain.  Musculoskeletal: Negative for back pain, joint swelling, arthralgias and gait problem.  Skin: Negative.  Negative for rash.  Neurological: Negative for dizziness, tremors, seizures, syncope, speech difficulty, weakness, numbness and headaches.  Hematological: Negative for adenopathy. Does not bruise/bleed easily.  Psychiatric/Behavioral: Negative for suicidal ideas, behavioral problems, sleep  disturbance, dysphoric mood and decreased concentration. The patient is nervous/anxious.        Objective:   Physical Exam  Constitutional: She appears well-developed. No distress.       obese  HENT:  Head: Normocephalic.  Right Ear: External ear normal.  Left Ear: External ear normal.  Nose: Nose normal.  Mouth/Throat: Oropharynx is clear and moist.       No halitosis  Eyes: Conjunctivae are normal. Pupils are equal, round, and reactive to light. Right eye exhibits no discharge. Left eye exhibits no discharge.  Neck: Normal range of motion. Neck supple. No JVD present. No tracheal deviation present. No thyromegaly present.  Cardiovascular: Normal rate, regular rhythm and normal heart sounds.   Pulmonary/Chest: No stridor. No respiratory distress. She has no wheezes.  Abdominal: Soft. Bowel sounds are normal. She exhibits no distension and no mass. There is no tenderness. There is no rebound and no guarding.  Musculoskeletal: She exhibits no edema and no tenderness.  Lymphadenopathy:    She has no cervical adenopathy.  Neurological: She displays normal reflexes. No cranial nerve deficit. She exhibits normal muscle tone. Coordination normal.  Skin: No rash noted. No erythema.  Psychiatric: She has a normal mood and affect. Her behavior is normal. Judgment and thought content normal.   Lab Results  Component Value Date   WBC 6.4 08/01/2011   HGB 14.3 08/01/2011   HCT 41.6 08/01/2011   PLT 151 08/01/2011   GLUCOSE 125* 03/11/2011   CHOL 158 12/07/2010   TRIG 92.0 12/07/2010   HDL 57.40 12/07/2010   LDLCALC 82 12/07/2010   ALT 20 02/16/2010   AST 25 02/16/2010   NA 131* 03/11/2011   K 3.2* 03/11/2011   CL 92* 03/11/2011   CREATININE 0.78 03/11/2011  BUN 11 03/11/2011   CO2 30 03/11/2011   TSH 2.20 02/16/2010   INR 1.0 10/07/2008   HGBA1C  Value: 5.9 (NOTE)   The ADA recommends the following therapeutic goal for glycemic   control related to Hgb A1C measurement:   Goal of Therapy:   < 7.0% Hgb  A1C   Reference: American Diabetes Association: Clinical Practice   Recommendations 2008, Diabetes Care,  2008, 31:(Suppl 1). 10/07/2008   MICROALBUR 0.4 07/14/2009          Assessment & Plan:

## 2011-10-18 NOTE — Assessment & Plan Note (Signed)
Better  

## 2011-10-22 ENCOUNTER — Other Ambulatory Visit: Payer: Self-pay | Admitting: Internal Medicine

## 2011-10-25 ENCOUNTER — Other Ambulatory Visit: Payer: Self-pay | Admitting: Oncology

## 2011-10-25 ENCOUNTER — Ambulatory Visit (HOSPITAL_BASED_OUTPATIENT_CLINIC_OR_DEPARTMENT_OTHER): Payer: Medicare Other

## 2011-10-25 VITALS — BP 146/74 | HR 59 | Temp 97.6°F

## 2011-10-25 DIAGNOSIS — C9110 Chronic lymphocytic leukemia of B-cell type not having achieved remission: Secondary | ICD-10-CM

## 2011-10-25 MED ORDER — IMMUNE GLOBULIN (HUMAN) 10 GM/100ML IV SOLN
30.0000 g | INTRAVENOUS | Status: DC
  Administered 2011-10-25: 30 g via INTRAVENOUS
  Filled 2011-10-25: qty 300

## 2011-10-25 MED ORDER — DIPHENHYDRAMINE HCL 25 MG PO CAPS
25.0000 mg | ORAL_CAPSULE | Freq: Once | ORAL | Status: AC
  Administered 2011-10-25: 25 mg via ORAL

## 2011-10-25 MED ORDER — ACETAMINOPHEN 325 MG PO TABS
650.0000 mg | ORAL_TABLET | Freq: Once | ORAL | Status: AC
  Administered 2011-10-25: 650 mg via ORAL

## 2011-11-22 ENCOUNTER — Ambulatory Visit (HOSPITAL_BASED_OUTPATIENT_CLINIC_OR_DEPARTMENT_OTHER): Payer: Medicare Other

## 2011-11-22 VITALS — BP 119/70 | HR 57 | Temp 97.5°F

## 2011-11-22 DIAGNOSIS — C9110 Chronic lymphocytic leukemia of B-cell type not having achieved remission: Secondary | ICD-10-CM

## 2011-11-22 MED ORDER — ACETAMINOPHEN 325 MG PO TABS
650.0000 mg | ORAL_TABLET | Freq: Once | ORAL | Status: AC
Start: 2011-11-22 — End: 2011-11-22
  Administered 2011-11-22: 650 mg via ORAL

## 2011-11-22 MED ORDER — IMMUNE GLOBULIN (HUMAN) 10 GM/100ML IV SOLN
30.0000 g | Freq: Once | INTRAVENOUS | Status: AC
  Administered 2011-11-22: 30 g via INTRAVENOUS
  Filled 2011-11-22: qty 300

## 2011-11-22 MED ORDER — DIPHENHYDRAMINE HCL 25 MG PO CAPS
25.0000 mg | ORAL_CAPSULE | Freq: Once | ORAL | Status: AC
  Administered 2011-11-22: 25 mg via ORAL

## 2011-11-22 NOTE — Patient Instructions (Signed)
[facility] Discharge Instructions for Patients Receiving IVIG Today you received the following IVIG  If you develop nausea and vomiting that is not controlled by your nausea medication, call the clinic. If it is after clinic hours your family physician or the after hours number for the clinic or go to the Emergency Department.   BELOW ARE SYMPTOMS THAT SHOULD BE REPORTED IMMEDIATELY:  *FEVER GREATER THAN 100.5 F  *CHILLS WITH OR WITHOUT FEVER  NAUSEA AND VOMITING THAT IS NOT CONTROLLED WITH YOUR NAUSEA MEDICATION  *UNUSUAL SHORTNESS OF BREATH  *UNUSUAL BRUISING OR BLEEDING  TENDERNESS IN MOUTH AND THROAT WITH OR WITHOUT PRESENCE OF ULCERS  *URINARY PROBLEMS  *BOWEL PROBLEMS  UNUSUAL RASH Items with * indicate a potential emergency and should be followed up as soon as possible.  One of the nurses will contact you 24 hours after your treatment. Please let the nurse know about any problems that you may have experienced. Feel free to call the clinic you have any questions or concerns. The clinic phone number is [phone].   I have been informed and understand all the instructions given to me. I know to contact the clinic, my physician, or go to the Emergency Department if any problems should occur. I do not have any questions at this time, but understand that I may call the clinic during office hours   should I have any questions or need assistance in obtaining follow up care.    __________________________________________  _____________  __________ Signature of Patient or Authorized Representative            Date                   Time    __________________________________________ Nurse's Signature

## 2011-12-20 ENCOUNTER — Ambulatory Visit (HOSPITAL_BASED_OUTPATIENT_CLINIC_OR_DEPARTMENT_OTHER): Payer: Medicare Other

## 2011-12-20 VITALS — BP 118/76 | HR 55 | Temp 97.4°F | Wt 151.3 lb

## 2011-12-20 DIAGNOSIS — C9110 Chronic lymphocytic leukemia of B-cell type not having achieved remission: Secondary | ICD-10-CM

## 2011-12-20 MED ORDER — IMMUNE GLOBULIN (HUMAN) 10 GM/100ML IV SOLN
30.0000 g | INTRAVENOUS | Status: DC
  Administered 2011-12-20: 30 g via INTRAVENOUS
  Filled 2011-12-20: qty 300

## 2011-12-20 MED ORDER — DIPHENHYDRAMINE HCL 25 MG PO CAPS
25.0000 mg | ORAL_CAPSULE | Freq: Once | ORAL | Status: AC
  Administered 2011-12-20: 25 mg via ORAL

## 2011-12-20 MED ORDER — ACETAMINOPHEN 325 MG PO TABS
650.0000 mg | ORAL_TABLET | Freq: Once | ORAL | Status: AC
  Administered 2011-12-20: 650 mg via ORAL

## 2011-12-20 NOTE — Progress Notes (Signed)
Privigen started at 0925 at rate of 20.5 mL/hr X 10.2 mL. Rate increased at 0955: 41 mL/hr X 20.5 mL.  Vital signs stable, no complaints from patient at this time. Rate increased at 1025: 82 mL/hr X 41 mL.  Vital signs stable, no complaints from patient at this time. Rate increased at 1055: 165 mL/hr X 82 mL.  Vital signs stable, no complaints from patient. Rate increased at 1125: 274 mL/hr X 137 mL.  Vital signs stable, no complaints from patient.  After trying to set pump to max rate of 329 mL/hr, pump not allowing rate to be infused.  Max rate allowed by pump is 274 mL/hr.  Pharmacy notified.  Privigen finished at rate of 274 mL/hr

## 2011-12-23 ENCOUNTER — Other Ambulatory Visit: Payer: Self-pay | Admitting: Internal Medicine

## 2012-01-23 ENCOUNTER — Ambulatory Visit: Payer: Medicare Other | Admitting: Oncology

## 2012-01-23 ENCOUNTER — Other Ambulatory Visit: Payer: Medicare Other | Admitting: Lab

## 2012-01-24 ENCOUNTER — Ambulatory Visit (HOSPITAL_BASED_OUTPATIENT_CLINIC_OR_DEPARTMENT_OTHER): Payer: Medicare Other

## 2012-01-24 ENCOUNTER — Telehealth: Payer: Self-pay | Admitting: Oncology

## 2012-01-24 ENCOUNTER — Ambulatory Visit (HOSPITAL_BASED_OUTPATIENT_CLINIC_OR_DEPARTMENT_OTHER): Payer: Medicare Other | Admitting: Oncology

## 2012-01-24 VITALS — BP 136/75 | HR 74 | Temp 98.1°F | Ht 64.0 in | Wt 151.1 lb

## 2012-01-24 VITALS — BP 112/71 | HR 58 | Temp 97.2°F

## 2012-01-24 DIAGNOSIS — C9110 Chronic lymphocytic leukemia of B-cell type not having achieved remission: Secondary | ICD-10-CM

## 2012-01-24 DIAGNOSIS — D801 Nonfamilial hypogammaglobulinemia: Secondary | ICD-10-CM

## 2012-01-24 DIAGNOSIS — D6959 Other secondary thrombocytopenia: Secondary | ICD-10-CM

## 2012-01-24 LAB — CBC WITH DIFFERENTIAL/PLATELET
Eosinophils Absolute: 0.2 10*3/uL (ref 0.0–0.5)
MONO#: 0.5 10*3/uL (ref 0.1–0.9)
NEUT#: 2.2 10*3/uL (ref 1.5–6.5)
Platelets: 112 10*3/uL — ABNORMAL LOW (ref 145–400)
RBC: 4.34 10*6/uL (ref 3.70–5.45)
RDW: 13.8 % (ref 11.2–14.5)
WBC: 5.4 10*3/uL (ref 3.9–10.3)
nRBC: 0 % (ref 0–0)

## 2012-01-24 MED ORDER — IMMUNE GLOBULIN (HUMAN) 5 GM/100ML IV SOLN
30.0000 g | Freq: Once | INTRAVENOUS | Status: AC
  Administered 2012-01-24: 30 g via INTRAVENOUS
  Filled 2012-01-24: qty 600

## 2012-01-24 MED ORDER — IMMUNE GLOBULIN (HUMAN) 10 GM/100ML IV SOLN: 400.0000 mg/kg | INTRAVENOUS | Status: DC

## 2012-01-24 MED ORDER — ACETAMINOPHEN 325 MG PO TABS
650.0000 mg | ORAL_TABLET | Freq: Once | ORAL | Status: AC
  Administered 2012-01-24: 650 mg via ORAL

## 2012-01-24 MED ORDER — DIPHENHYDRAMINE HCL 25 MG PO CAPS
25.0000 mg | ORAL_CAPSULE | Freq: Once | ORAL | Status: AC
  Administered 2012-01-24: 25 mg via ORAL

## 2012-01-24 NOTE — Progress Notes (Signed)
1120- Infusion rate increased to 4mls/hour. Patient tolerating infusion well.  1150- Infusion rate increased to 141mls/hour. Patient tolerating infusion well.  1220- Infusion rate increased to 268mls/hour. Patient tolerating infusion well.

## 2012-01-24 NOTE — Telephone Encounter (Signed)
appts made and pt aware that i will call with  ivig appts aom

## 2012-01-24 NOTE — Progress Notes (Signed)
   [facility]    OFFICE PROGRESS NOTE   INTERVAL HISTORY:   She returns as scheduled. No fever, night sweats, or anorexia. No recent infection. She continues monthly IVIG. She reports tolerating the IVIG well.  Objective:  Vital signs in last 24 hours:  Blood pressure 136/75, pulse 74, temperature 98.1 F (36.7 C), temperature source Oral, height 5\' 4"  (1.626 m), weight 151 lb 1.6 oz (68.539 kg).    HEENT: Neck without mass Lymphatics: No cervical, supraclavicular, or axillary nodes Resp: Lungs clear bilaterally Cardio: Regular rate and rhythm GI: No hepatosplenomegaly Vascular: No leg edema   Lab Results:  Lab Results  Component Value Date   WBC 5.4 01/24/2012   HGB 13.5 01/24/2012   HCT 38.1 01/24/2012   MCV 87.8 01/24/2012   PLT 112* 01/24/2012   ANC 2.2    Medications: I have reviewed the patient's current medications.  Assessment/Plan: 1. Chronic lymphocytic leukemia, asymptomatic. 2. History of hypogammaglobulinemia with recurrent infections. She continues monthly IVIG replacement therapy. 3. History of Recurrent urinary tract infections.  4. 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  Disposition:  She appears stable. Ms. Cobert feels the IVIG is preventing recurrent infections. The plan is to continue monthly IVIG therapy. She will return for an office visit in 6 months. We will be sure she is up-to-date on the influenza and pneumococcal vaccines.   Thornton Papas, MD  01/24/2012  9:52 AM

## 2012-01-24 NOTE — Patient Instructions (Signed)
[facility] Discharge Instructions for Patients Receiving IVIG  Today you received the following chemotherapy agents Octogam.  To help prevent nausea and vomiting after your treatment, we encourage you to take your nausea medication.   If you develop nausea and vomiting that is not controlled by your nausea medication, call the clinic. If it is after clinic hours your family physician or the after hours number for the clinic or go to the Emergency Department.   BELOW ARE SYMPTOMS THAT SHOULD BE REPORTED IMMEDIATELY:  *FEVER GREATER THAN 100.5 F  *CHILLS WITH OR WITHOUT FEVER  NAUSEA AND VOMITING THAT IS NOT CONTROLLED WITH YOUR NAUSEA MEDICATION  *UNUSUAL SHORTNESS OF BREATH  *UNUSUAL BRUISING OR BLEEDING  TENDERNESS IN MOUTH AND THROAT WITH OR WITHOUT PRESENCE OF ULCERS  *URINARY PROBLEMS  *BOWEL PROBLEMS  UNUSUAL RASH Items with * indicate a potential emergency and should be followed up as soon as possible.  One of the nurses will contact you 24 hours after your treatment. Please let the nurse know about any problems that you may have experienced. Feel free to call the clinic you have any questions or concerns. The clinic phone number is [phone].   I have been informed and understand all the instructions given to me. I know to contact the clinic, my physician, or go to the Emergency Department if any problems should occur. I do not have any questions at this time, but understand that I may call the clinic during office hours   should I have any questions or need assistance in obtaining follow up care.    __________________________________________  _____________  __________ Signature of Patient or Authorized Representative            Date                   Time    __________________________________________ Nurse's Signature

## 2012-01-29 ENCOUNTER — Telehealth: Payer: Self-pay | Admitting: Oncology

## 2012-01-29 ENCOUNTER — Ambulatory Visit: Payer: Medicare Other | Admitting: Oncology

## 2012-01-29 NOTE — Telephone Encounter (Signed)
l/m with 7/31 appt and advised to p.u a sch then or prior if needed    aom

## 2012-01-31 ENCOUNTER — Encounter: Payer: Self-pay | Admitting: Internal Medicine

## 2012-01-31 ENCOUNTER — Ambulatory Visit (INDEPENDENT_AMBULATORY_CARE_PROVIDER_SITE_OTHER): Payer: Medicare Other | Admitting: Internal Medicine

## 2012-01-31 VITALS — BP 110/70 | HR 80 | Temp 98.4°F | Resp 16 | Wt 151.0 lb

## 2012-01-31 DIAGNOSIS — G609 Hereditary and idiopathic neuropathy, unspecified: Secondary | ICD-10-CM

## 2012-01-31 DIAGNOSIS — E538 Deficiency of other specified B group vitamins: Secondary | ICD-10-CM

## 2012-01-31 DIAGNOSIS — C9110 Chronic lymphocytic leukemia of B-cell type not having achieved remission: Secondary | ICD-10-CM

## 2012-01-31 DIAGNOSIS — I10 Essential (primary) hypertension: Secondary | ICD-10-CM

## 2012-01-31 NOTE — Assessment & Plan Note (Signed)
Continue with current prescription therapy as reflected on the Med list.  

## 2012-01-31 NOTE — Assessment & Plan Note (Signed)
Better  

## 2012-01-31 NOTE — Progress Notes (Signed)
Subjective:    Patient ID: [name], female    DOB: 12-22-1925, 78 y.o.   MRN: [id number]  HPI   The patient presents for a follow-up of  chronic hypertension, chronic dyslipidemia, anxiety (mild) controlled with medicines; halitosis. BP is good home. Her 2nd son had a CVA in 2.13  BP Readings from Last 3 Encounters:  01/31/12 110/70  01/24/12 112/71  01/24/12 136/75   Wt Readings from Last 3 Encounters:  01/31/12 151 lb (68.493 kg)  01/24/12 151 lb 1.6 oz (68.539 kg)  12/20/11 151 lb 4.8 oz (68.629 kg)       Review of Systems  Constitutional: Positive for fatigue. Negative for fever, chills, diaphoresis, activity change, appetite change and unexpected weight change.  HENT: Negative for hearing loss, ear pain, nosebleeds, congestion, sore throat, facial swelling, rhinorrhea, sneezing, mouth sores, trouble swallowing, neck pain, neck stiffness, postnasal drip, sinus pressure and tinnitus.   Eyes: Negative for pain, discharge, redness, itching and visual disturbance.  Respiratory: Negative for cough, chest tightness, shortness of breath, wheezing and stridor.   Cardiovascular: Negative for chest pain, palpitations and leg swelling.  Gastrointestinal: Negative for nausea, diarrhea, constipation, blood in stool, abdominal distention, anal bleeding and rectal pain.  Genitourinary: Negative for dysuria, urgency, frequency, hematuria, flank pain, vaginal bleeding, vaginal discharge, difficulty urinating, genital sores and pelvic pain.  Musculoskeletal: Negative for back pain, joint swelling, arthralgias and gait problem.  Skin: Negative.  Negative for rash.  Neurological: Negative for dizziness, tremors, seizures, syncope, speech difficulty, weakness, numbness and headaches.  Hematological: Negative for adenopathy. Does not bruise/bleed easily.  Psychiatric/Behavioral: Negative for suicidal ideas, behavioral problems, disturbed wake/sleep cycle, dysphoric mood and decreased  concentration. The patient is nervous/anxious.        Objective:   Physical Exam  Constitutional: She appears well-developed. No distress.       obese  HENT:  Head: Normocephalic.  Right Ear: External ear normal.  Left Ear: External ear normal.  Nose: Nose normal.  Mouth/Throat: Oropharynx is clear and moist.       No halitosis  Eyes: Conjunctivae are normal. Pupils are equal, round, and reactive to light. Right eye exhibits no discharge. Left eye exhibits no discharge.  Neck: Normal range of motion. Neck supple. No JVD present. No tracheal deviation present. No thyromegaly present.  Cardiovascular: Normal rate, regular rhythm and normal heart sounds.   Pulmonary/Chest: No stridor. No respiratory distress. She has no wheezes.  Abdominal: Soft. Bowel sounds are normal. She exhibits no distension and no mass. There is no tenderness. There is no rebound and no guarding.  Musculoskeletal: She exhibits no edema and no tenderness.  Lymphadenopathy:    She has no cervical adenopathy.  Neurological: She displays normal reflexes. No cranial nerve deficit. She exhibits normal muscle tone. Coordination normal.  Skin: No rash noted. No erythema.  Psychiatric: She has a normal mood and affect. Her behavior is normal. Judgment and thought content normal.   Lab Results  Component Value Date   WBC 5.4 01/24/2012   HGB 13.5 01/24/2012   HCT 38.1 01/24/2012   PLT 112* 01/24/2012   GLUCOSE 125* 03/11/2011   CHOL 158 12/07/2010   TRIG 92.0 12/07/2010   HDL 57.40 12/07/2010   LDLCALC 82 12/07/2010   ALT 20 02/16/2010   AST 25 02/16/2010   NA 131* 03/11/2011   K 3.2* 03/11/2011   CL 92* 03/11/2011   CREATININE 0.78 03/11/2011   BUN 11 03/11/2011   CO2 30 03/11/2011  TSH 2.20 02/16/2010   INR 1.0 10/07/2008   HGBA1C  Value: 5.9 (NOTE)   The ADA recommends the following therapeutic goal for glycemic   control related to Hgb A1C measurement:   Goal of Therapy:   < 7.0% Hgb A1C   Reference: American Diabetes  Association: Clinical Practice   Recommendations 2008, Diabetes Care,  2008, 31:(Suppl 1). 10/07/2008   MICROALBUR 0.4 07/14/2009          Assessment & Plan:

## 2012-01-31 NOTE — Assessment & Plan Note (Signed)
F/u w/Dr Scherrill

## 2012-02-05 NOTE — Progress Notes (Signed)
Pt. Discharged at 1420hr.  IVIG ended 1415hr on 01/24/12.

## 2012-02-12 ENCOUNTER — Other Ambulatory Visit: Payer: Self-pay | Admitting: Internal Medicine

## 2012-02-21 ENCOUNTER — Telehealth: Payer: Self-pay | Admitting: Oncology

## 2012-02-21 ENCOUNTER — Ambulatory Visit (HOSPITAL_BASED_OUTPATIENT_CLINIC_OR_DEPARTMENT_OTHER): Payer: Medicare Other

## 2012-02-21 ENCOUNTER — Other Ambulatory Visit: Payer: Self-pay | Admitting: Oncology

## 2012-02-21 VITALS — BP 110/68 | HR 55 | Temp 97.6°F

## 2012-02-21 DIAGNOSIS — C9110 Chronic lymphocytic leukemia of B-cell type not having achieved remission: Secondary | ICD-10-CM

## 2012-02-21 DIAGNOSIS — Z23 Encounter for immunization: Secondary | ICD-10-CM

## 2012-02-21 DIAGNOSIS — D801 Nonfamilial hypogammaglobulinemia: Secondary | ICD-10-CM

## 2012-02-21 MED ORDER — ACETAMINOPHEN 325 MG PO TABS
650.0000 mg | ORAL_TABLET | Freq: Once | ORAL | Status: AC
  Administered 2012-02-21: 650 mg via ORAL

## 2012-02-21 MED ORDER — DIPHENHYDRAMINE HCL 25 MG PO CAPS
25.0000 mg | ORAL_CAPSULE | Freq: Once | ORAL | Status: AC
  Administered 2012-02-21: 25 mg via ORAL

## 2012-02-21 MED ORDER — SODIUM CHLORIDE 0.9 % IV SOLN
INTRAVENOUS | Status: DC
  Administered 2012-02-21: 09:00:00 via INTRAVENOUS

## 2012-02-21 MED ORDER — IMMUNE GLOBULIN (HUMAN) 10 GM/100ML IV SOLN: 400.0000 mg/kg | INTRAVENOUS | Status: DC

## 2012-02-21 MED ORDER — IMMUNE GLOBULIN (HUMAN) 10 GM/200ML IV SOLN
30.0000 g | Freq: Once | INTRAVENOUS | Status: AC
  Administered 2012-02-21: 30 g via INTRAVENOUS
  Filled 2012-02-21: qty 600

## 2012-02-21 MED ORDER — PNEUMOCOCCAL VAC POLYVALENT 25 MCG/0.5ML IJ INJ
0.5000 mL | INJECTION | Freq: Once | INTRAMUSCULAR | Status: AC
  Administered 2012-02-21: 0.5 mL via INTRAMUSCULAR

## 2012-02-21 NOTE — Patient Instructions (Signed)
Immune Globulin   What is this medicine? IMMUNE GLOBULIN (im MUNE GLOB yoo lin) helps to prevent or reduce the severity of certain infections in patients who are at risk. This medicine is collected from the pooled blood of many donors. It is used to treat immune system problems, thrombocytopenia, and Kawasaki syndrome. This medicine may be used for other purposes; ask your health care provider or pharmacist if you have questions. What should I tell my health care provider before I take this medicine? They need to know if you have any of these conditions: - diabetes - extremely low or no immune antibodies in the blood - heart disease - history of blood clots - hyperprolinemia - infection in the blood, sepsis - kidney disease - taking medicine that may change kidney function - ask your health care provider about your medicine - an unusual or allergic reaction to human immune globulin, albumin, maltose, sucrose, polysorbate 80, other medicines, foods, dyes, or preservatives - pregnant or trying to get pregnant - breast-feeding  How should I use this medicine? This medicine is for injection into a muscle or infusion into a vein or skin. It is usually given by a health care professional in a hospital or clinic setting. In rare cases, some brands of this medicine might be given at home. You will be taught how to give this medicine. Use exactly as directed. Take your medicine at regular intervals. Do not take your medicine more often than directed. Talk to your pediatrician regarding the use of this medicine in children. Special care may be needed. Overdosage: If you think you have taken too much of this medicine contact a poison control center or emergency room at once. NOTE: This medicine is only for you. Do not share this medicine with others. What if I miss a dose? It is important not to miss your dose. Call your doctor or health care professional if you are unable to keep an  appointment. If you give yourself the medicine and you miss a dose, take it as soon as you can. If it is almost time for your next dose, take only that dose. Do not take double or extra doses. What may interact with this medicine? -aspirin and aspirin-like medicines -cisplatin -cyclosporine -medicines for infection like acyclovir, adefovir, amphotericin B, bacitracin, cidofovir, foscarnet, ganciclovir, gentamicin, pentamidine, vancomycin -NSAIDS, medicines for pain and inflammation, like ibuprofen or naproxen -pamidronate -vaccines -zoledronic acid This list may not describe all possible interactions. Give your health care provider a list of all the medicines, herbs, non-prescription drugs, or dietary supplements you use. Also tell them if you smoke, drink alcohol, or use illegal drugs. Some items may interact with your medicine.  What should I watch for while using this medicine? Your condition will be monitored carefully while you are receiving this medicine. This medicine is made from pooled blood donations of many different people. It may be possible to pass an infection in this medicine. However, the donors are screened for infections and all products are tested for HIV and hepatitis. The medicine is treated to kill most or all bacteria and viruses. Talk to your doctor about the risks and benefits of this medicine. Do not have vaccinations for at least 14 days before, or until at least 3 months after receiving this medicine. What side effects may I notice from receiving this medicine? Side effects that you should report to your doctor or health care professional as soon as possible: -allergic reactions like skin rash, itching or  hives, swelling of the face, lips, or tongue -breathing problems -chest pain or tightness -fever, chills -headache with nausea, vomiting -neck pain or difficulty moving neck -pain when moving eyes -pain, swelling, warmth in the leg -problems with balance,  talking, walking -sudden weight gain -swelling of the ankles, feet, hands -trouble passing urine or change in the amount of urine  Side effects that usually do not require medical attention (report to your doctor or health care professional if they continue or are bothersome): -dizzy, drowsy -flushing -increased sweating -leg cramps -muscle aches and pains -pain at site where injected This list may not describe all possible side effects. Call your doctor for medical advice about side effects. You may report side effects to FDA at 1-800-FDA-1088. Where should I keep my medicine? Keep out of the reach of children. This drug is usually given in a hospital or clinic and will not be stored at home. In rare cases, some brands of this medicine may be given at home. If you are using this medicine at home, you will be instructed on how to store this medicine. Throw away any unused medicine after the expiration date on the label. NOTE: This sheet is a summary. It may not cover all possible information. If you have questions about this medicine, talk to your doctor, pharmacist, or health care provider.  2012, Elsevier/Gold Standard. (09/30/2008 11:44:49 AM)

## 2012-02-21 NOTE — Addendum Note (Signed)
Addended by: Tia Alert A on: 02/21/2012 02:02 PM   Modules accepted: Orders

## 2012-02-21 NOTE — Telephone Encounter (Signed)
Gave pt appt for August, September , October ,November, December and January 2014

## 2012-02-21 NOTE — Progress Notes (Signed)
1035 Octagam increased to 41ml/hr x 41ml.  Pt tolerating well and vitals are stable. 1105 Increased infusion to 114ml/hr x 82ml.  Pt tolerating well. 1135 Increased infusion to 239ml/hr x .  Pt tolerating well and vitals remain stable. 1205 Infusion continues at 258ml/hr x remainder of infusion.  Pt tolerating well.

## 2012-02-23 ENCOUNTER — Telehealth: Payer: Self-pay | Admitting: *Deleted

## 2012-02-23 NOTE — Telephone Encounter (Signed)
Patient called to report her arm that had pneumonia vaccine on 7/31 has become swollen almost down to her elbow. It is red and warm to touch and sore. No fever. Asking what to do? Per Dr. Truett Perna: Probable allergic reaction to the vaccine. Take Ibuprofen for the pain & swelling every 6 hours prn and Benadryl 25 mg po every 6 hours prn. Call back for fever or worsening in swelling. Will note as allergy.

## 2012-03-12 ENCOUNTER — Other Ambulatory Visit: Payer: Self-pay | Admitting: Internal Medicine

## 2012-03-20 ENCOUNTER — Ambulatory Visit (HOSPITAL_BASED_OUTPATIENT_CLINIC_OR_DEPARTMENT_OTHER): Payer: Medicare Other

## 2012-03-20 VITALS — BP 133/77 | HR 60 | Temp 98.4°F | Resp 18

## 2012-03-20 DIAGNOSIS — C9110 Chronic lymphocytic leukemia of B-cell type not having achieved remission: Secondary | ICD-10-CM

## 2012-03-20 MED ORDER — DIPHENHYDRAMINE HCL 25 MG PO CAPS
25.0000 mg | ORAL_CAPSULE | Freq: Once | ORAL | Status: AC
  Administered 2012-03-20: 25 mg via ORAL

## 2012-03-20 MED ORDER — IMMUNE GLOBULIN (HUMAN) 10 GM/200ML IV SOLN
400.0000 mg/kg | Freq: Once | INTRAVENOUS | Status: AC
  Administered 2012-03-20: 30 g via INTRAVENOUS
  Filled 2012-03-20: qty 600

## 2012-03-20 MED ORDER — IMMUNE GLOBULIN (HUMAN) 10 GM/100ML IV SOLN: 400.0000 mg/kg | INTRAVENOUS | Status: DC

## 2012-03-20 MED ORDER — ACETAMINOPHEN 325 MG PO TABS
650.0000 mg | ORAL_TABLET | Freq: Once | ORAL | Status: AC
  Administered 2012-03-20: 650 mg via ORAL

## 2012-03-20 NOTE — Patient Instructions (Signed)
IMMUNE GLOBULIN (im MUNE GLOB yoo lin) helps to prevent or reduce the severity of certain infections in patients who are at risk. This medicine is collected from the pooled blood of many donors. It is used to treat immune system problems, thrombocytopenia, and Kawasaki syndrome. This medicine may be used for other purposes; ask your health care provider or pharmacist if you have questions. What should I tell my health care provider before I take this medicine? They need to know if you have any of these conditions: - diabetes - extremely low or no immune antibodies in the blood - heart disease - history of blood clots - hyperprolinemia - infection in the blood, sepsis - kidney disease - taking medicine that may change kidney function - ask your health care provider about your medicine - an unusual or allergic reaction to human immune globulin, albumin, maltose, sucrose, polysorbate 80, other medicines, foods, dyes, or preservatives - pregnant or trying to get pregnant - breast-feeding How should I use this medicine? This medicine is for injection into a muscle or infusion into a vein or skin. It is usually given by a health care professional in a hospital or clinic setting. In rare cases, some brands of this medicine might be given at home. You will be taught how to give this medicine. Use exactly as directed. Take your medicine at regular intervals. Do not take your medicine more often than directed. Talk to your pediatrician regarding the use of this medicine in children. Special care may be needed. Overdosage: If you think you have taken too much of this medicine contact a poison control center or emergency room at once. NOTE: This medicine is only for you. Do not share this medicine with others. What if I miss a dose? It is important not to miss your dose. Call your doctor or health care professional if you are unable to keep an appointment. If you give yourself the medicine and  you miss a dose, take it as soon as you can. If it is almost time for your next dose, take only that dose. Do not take double or extra doses. What may interact with this medicine? -aspirin and aspirin-like medicines -cisplatin -cyclosporine -medicines for infection like acyclovir, adefovir, amphotericin B, bacitracin, cidofovir, foscarnet, ganciclovir, gentamicin, pentamidine, vancomycin -NSAIDS, medicines for pain and inflammation, like ibuprofen or naproxen -pamidronate -vaccines -zoledronic acid This list may not describe all possible interactions. Give your health care provider a list of all the medicines, herbs, non-prescription drugs, or dietary supplements you use. Also tell them if you smoke, drink alcohol, or use illegal drugs. Some items may interact with your medicine. What should I watch for while using this medicine? Your condition will be monitored carefully while you are receiving this medicine. This medicine is made from pooled blood donations of many different people. It may be possible to pass an infection in this medicine. However, the donors are screened for infections and all products are tested for HIV and hepatitis. The medicine is treated to kill most or all bacteria and viruses. Talk to your doctor about the risks and benefits of this medicine. Do not have vaccinations for at least 14 days before, or until at least 3 months after receiving this medicine. What side effects may I notice from receiving this medicine? Side effects that you should report to your doctor or health care professional as soon as possible: -allergic reactions like skin rash, itching or hives, swelling of the face, lips, or tongue -breathing problems -  chest pain or tightness -fever, chills -headache with nausea, vomiting -neck pain or difficulty moving neck -pain when moving eyes -pain, swelling, warmth in the leg -problems with balance, talking, walking -sudden weight gain -swelling of the  ankles, feet, hands -trouble passing urine or change in the amount of urine Side effects that usually do not require medical attention (report to your doctor or health care professional if they continue or are bothersome): -dizzy, drowsy -flushing -increased sweating -leg cramps -muscle aches and pains -pain at site where injected This list may not describe all possible side effects. Call your doctor for medical advice about side effects. You may report side effects to FDA at 1-800-FDA-1088. Where should I keep my medicine? Keep out of the reach of children. This drug is usually given in a hospital or clinic and will not be stored at home. In rare cases, some brands of this medicine may be given at home. If you are using this medicine at home, you will be instructed on how to store this medicine. Throw away any unused medicine after the expiration date on the label. NOTE: This sheet is a summary. It may not cover all possible information. If you have questions about this medicine, talk to your doctor, pharmacist, or health care provider.  2012, Elsevier/Gold Standard. (09/30/2008 11:44:49 AM) 

## 2012-03-20 NOTE — Progress Notes (Signed)
1030 Octagam started at 41ml/hr x 21ml VTBI 1100 VSS. Rate increased to 54ml/hr x 41 ml VTBI 1130 VSS. Rate increased to 117ml/hr x 83 ml VTBI 1200 VSS. Rate increased to max 241ml/hr x 100 ml VTBI 1230 VSS. Rate maintained at 200 ml/hr till completion.

## 2012-04-10 ENCOUNTER — Other Ambulatory Visit (HOSPITAL_BASED_OUTPATIENT_CLINIC_OR_DEPARTMENT_OTHER): Payer: Medicare Other | Admitting: Lab

## 2012-04-10 DIAGNOSIS — C9110 Chronic lymphocytic leukemia of B-cell type not having achieved remission: Secondary | ICD-10-CM

## 2012-04-10 LAB — CBC WITH DIFFERENTIAL/PLATELET
Basophils Absolute: 0 10*3/uL (ref 0.0–0.1)
EOS%: 2.1 % (ref 0.0–7.0)
HGB: 13.7 g/dL (ref 11.6–15.9)
MCH: 30.9 pg (ref 25.1–34.0)
MCV: 90.1 fL (ref 79.5–101.0)
MONO%: 6.4 % (ref 0.0–14.0)
RDW: 14.1 % (ref 11.2–14.5)

## 2012-04-17 ENCOUNTER — Ambulatory Visit (HOSPITAL_BASED_OUTPATIENT_CLINIC_OR_DEPARTMENT_OTHER): Payer: Medicare Other

## 2012-04-17 ENCOUNTER — Other Ambulatory Visit: Payer: Self-pay | Admitting: Oncology

## 2012-04-17 VITALS — BP 119/64 | HR 63 | Temp 97.3°F | Resp 20 | Wt 151.0 lb

## 2012-04-17 DIAGNOSIS — C9110 Chronic lymphocytic leukemia of B-cell type not having achieved remission: Secondary | ICD-10-CM

## 2012-04-17 DIAGNOSIS — D801 Nonfamilial hypogammaglobulinemia: Secondary | ICD-10-CM

## 2012-04-17 MED ORDER — ACETAMINOPHEN 325 MG PO TABS
650.0000 mg | ORAL_TABLET | Freq: Four times a day (QID) | ORAL | Status: DC | PRN
  Administered 2012-04-17: 650 mg via ORAL

## 2012-04-17 MED ORDER — HEPARIN SOD (PORK) LOCK FLUSH 100 UNIT/ML IV SOLN
500.0000 [IU] | Freq: Once | INTRAVENOUS | Status: DC | PRN
  Filled 2012-04-17: qty 5

## 2012-04-17 MED ORDER — IMMUNE GLOBULIN (HUMAN) 10 GM/100ML IV SOLN: 30.0000 g | Freq: Once | INTRAVENOUS | Status: DC

## 2012-04-17 MED ORDER — IMMUNE GLOBULIN (HUMAN) 10 GM/100ML IV SOLN
30.0000 g | Freq: Once | INTRAVENOUS | Status: DC
  Filled 2012-04-17: qty 300

## 2012-04-17 MED ORDER — DIPHENHYDRAMINE HCL 25 MG PO TABS
25.0000 mg | ORAL_TABLET | Freq: Once | ORAL | Status: AC
  Administered 2012-04-17: 25 mg via ORAL
  Filled 2012-04-17: qty 1

## 2012-04-17 MED ORDER — IMMUNE GLOBULIN (HUMAN) 5 GM/100ML IV SOLN
425.0000 mg/kg | Freq: Once | INTRAVENOUS | Status: AC
  Administered 2012-04-17: 30 g via INTRAVENOUS
  Filled 2012-04-17: qty 600

## 2012-04-17 MED ORDER — SODIUM CHLORIDE 0.9 % IV SOLN
Freq: Once | INTRAVENOUS | Status: AC
  Administered 2012-04-17: 10:00:00 via INTRAVENOUS

## 2012-04-17 NOTE — Patient Instructions (Signed)
Patient aware of next appointment; discharged home with no complaints. 

## 2012-04-25 ENCOUNTER — Other Ambulatory Visit: Payer: Self-pay | Admitting: Internal Medicine

## 2012-05-02 ENCOUNTER — Ambulatory Visit (INDEPENDENT_AMBULATORY_CARE_PROVIDER_SITE_OTHER): Payer: Medicare Other | Admitting: Internal Medicine

## 2012-05-02 ENCOUNTER — Encounter: Payer: Self-pay | Admitting: Internal Medicine

## 2012-05-02 ENCOUNTER — Other Ambulatory Visit: Payer: Self-pay | Admitting: Internal Medicine

## 2012-05-02 VITALS — BP 140/82 | HR 76 | Temp 97.5°F | Resp 16 | Wt 146.0 lb

## 2012-05-02 DIAGNOSIS — R0989 Other specified symptoms and signs involving the circulatory and respiratory systems: Secondary | ICD-10-CM

## 2012-05-02 DIAGNOSIS — C9110 Chronic lymphocytic leukemia of B-cell type not having achieved remission: Secondary | ICD-10-CM

## 2012-05-02 DIAGNOSIS — I10 Essential (primary) hypertension: Secondary | ICD-10-CM

## 2012-05-02 DIAGNOSIS — M1990 Unspecified osteoarthritis, unspecified site: Secondary | ICD-10-CM

## 2012-05-02 DIAGNOSIS — E538 Deficiency of other specified B group vitamins: Secondary | ICD-10-CM

## 2012-05-02 DIAGNOSIS — Z23 Encounter for immunization: Secondary | ICD-10-CM

## 2012-05-02 DIAGNOSIS — E785 Hyperlipidemia, unspecified: Secondary | ICD-10-CM

## 2012-05-02 DIAGNOSIS — J309 Allergic rhinitis, unspecified: Secondary | ICD-10-CM

## 2012-05-02 NOTE — Assessment & Plan Note (Signed)
Doing well 

## 2012-05-02 NOTE — Assessment & Plan Note (Signed)
Continue with current prescription therapy as reflected on the Med list.  

## 2012-05-02 NOTE — Progress Notes (Signed)
Subjective:    Patient ID: [name], female    DOB: Dec 11, 1925, 78 y.o.   MRN: [id number]  HPI   The patient presents for a follow-up of  chronic hypertension, chronic dyslipidemia, anxiety (mild) controlled with medicines; halitosis. BP is good home. Lost some wt.  Her 2nd son had a CVA in 2.13  BP Readings from Last 3 Encounters:  05/02/12 140/82  04/17/12 119/64  03/20/12 133/77   Wt Readings from Last 3 Encounters:  05/02/12 146 lb (66.225 kg)  04/17/12 151 lb 0.2 oz (68.5 kg)  01/31/12 151 lb (68.493 kg)       Review of Systems  Constitutional: Positive for fatigue. Negative for fever, chills, diaphoresis, activity change, appetite change and unexpected weight change.  HENT: Negative for hearing loss, ear pain, nosebleeds, congestion, sore throat, facial swelling, rhinorrhea, sneezing, mouth sores, trouble swallowing, neck pain, neck stiffness, postnasal drip, sinus pressure and tinnitus.   Eyes: Negative for pain, discharge, redness, itching and visual disturbance.  Respiratory: Negative for cough, chest tightness, shortness of breath, wheezing and stridor.   Cardiovascular: Negative for chest pain, palpitations and leg swelling.  Gastrointestinal: Negative for nausea, diarrhea, constipation, blood in stool, abdominal distention, anal bleeding and rectal pain.  Genitourinary: Negative for dysuria, urgency, frequency, hematuria, flank pain, vaginal bleeding, vaginal discharge, difficulty urinating, genital sores and pelvic pain.  Musculoskeletal: Negative for back pain, joint swelling, arthralgias and gait problem.  Skin: Negative.  Negative for rash.  Neurological: Negative for dizziness, tremors, seizures, syncope, speech difficulty, weakness, numbness and headaches.  Hematological: Negative for adenopathy. Does not bruise/bleed easily.  Psychiatric/Behavioral: Negative for suicidal ideas, behavioral problems, disturbed wake/sleep cycle, dysphoric mood and  decreased concentration. The patient is nervous/anxious.        Objective:   Physical Exam  Constitutional: She appears well-developed. No distress.       obese  HENT:  Head: Normocephalic.  Right Ear: External ear normal.  Left Ear: External ear normal.  Nose: Nose normal.  Mouth/Throat: Oropharynx is clear and moist.       No halitosis  Eyes: Conjunctivae normal are normal. Pupils are equal, round, and reactive to light. Right eye exhibits no discharge. Left eye exhibits no discharge.  Neck: Normal range of motion. Neck supple. No JVD present. No tracheal deviation present. No thyromegaly present.  Cardiovascular: Normal rate, regular rhythm and normal heart sounds.   Pulmonary/Chest: No stridor. No respiratory distress. She has no wheezes.  Abdominal: Soft. Bowel sounds are normal. She exhibits no distension and no mass. There is no tenderness. There is no rebound and no guarding.  Musculoskeletal: She exhibits no edema and no tenderness.  Lymphadenopathy:    She has no cervical adenopathy.  Neurological: She displays normal reflexes. No cranial nerve deficit. She exhibits normal muscle tone. Coordination normal.  Skin: No rash noted. No erythema.  Psychiatric: She has a normal mood and affect. Her behavior is normal. Judgment and thought content normal.   Lab Results  Component Value Date   WBC 5.8 04/10/2012   HGB 13.7 04/10/2012   HCT 39.9 04/10/2012   PLT 140* 04/10/2012   GLUCOSE 125* 03/11/2011   CHOL 158 12/07/2010   TRIG 92.0 12/07/2010   HDL 57.40 12/07/2010   LDLCALC 82 12/07/2010   ALT 20 02/16/2010   AST 25 02/16/2010   NA 131* 03/11/2011   K 3.2* 03/11/2011   CL 92* 03/11/2011   CREATININE 0.78 03/11/2011   BUN 11 03/11/2011  CO2 30 03/11/2011   TSH 2.20 02/16/2010   INR 1.0 10/07/2008   HGBA1C  Value: 5.9 (NOTE)   The ADA recommends the following therapeutic goal for glycemic   control related to Hgb A1C measurement:   Goal of Therapy:   < 7.0% Hgb A1C   Reference:  American Diabetes Association: Clinical Practice   Recommendations 2008, Diabetes Care,  2008, 31:(Suppl 1). 10/07/2008   MICROALBUR 0.4 07/14/2009          Assessment & Plan:

## 2012-05-02 NOTE — Assessment & Plan Note (Signed)
Continue with current prescription therapy as reflected on the Med list. prn 

## 2012-05-02 NOTE — Assessment & Plan Note (Signed)
Car doppler Continue with current prescription therapy as reflected on the Med list.

## 2012-05-08 ENCOUNTER — Other Ambulatory Visit: Payer: Self-pay | Admitting: Cardiology

## 2012-05-08 DIAGNOSIS — R0989 Other specified symptoms and signs involving the circulatory and respiratory systems: Secondary | ICD-10-CM

## 2012-05-10 ENCOUNTER — Encounter (INDEPENDENT_AMBULATORY_CARE_PROVIDER_SITE_OTHER): Payer: Medicare Other

## 2012-05-10 DIAGNOSIS — R0989 Other specified symptoms and signs involving the circulatory and respiratory systems: Secondary | ICD-10-CM

## 2012-05-14 ENCOUNTER — Telehealth: Payer: Self-pay | Admitting: Internal Medicine

## 2012-05-14 NOTE — Telephone Encounter (Signed)
[name], please, inform patient that her carotid arteries were normal bu [place] Thx

## 2012-05-15 ENCOUNTER — Other Ambulatory Visit (HOSPITAL_BASED_OUTPATIENT_CLINIC_OR_DEPARTMENT_OTHER): Payer: Medicare Other | Admitting: Lab

## 2012-05-15 ENCOUNTER — Ambulatory Visit (HOSPITAL_BASED_OUTPATIENT_CLINIC_OR_DEPARTMENT_OTHER): Payer: Medicare Other

## 2012-05-15 VITALS — BP 125/70 | HR 65 | Temp 96.9°F | Resp 20

## 2012-05-15 DIAGNOSIS — C9110 Chronic lymphocytic leukemia of B-cell type not having achieved remission: Secondary | ICD-10-CM

## 2012-05-15 DIAGNOSIS — D801 Nonfamilial hypogammaglobulinemia: Secondary | ICD-10-CM

## 2012-05-15 LAB — CBC WITH DIFFERENTIAL/PLATELET
Basophils Absolute: 0 10*3/uL (ref 0.0–0.1)
EOS%: 3 % (ref 0.0–7.0)
Eosinophils Absolute: 0.2 10*3/uL (ref 0.0–0.5)
HGB: 13 g/dL (ref 11.6–15.9)
MONO#: 0.4 10*3/uL (ref 0.1–0.9)
NEUT#: 2.1 10*3/uL (ref 1.5–6.5)
RDW: 14.2 % (ref 11.2–14.5)
lymph#: 3.4 10*3/uL — ABNORMAL HIGH (ref 0.9–3.3)

## 2012-05-15 MED ORDER — ACETAMINOPHEN 325 MG PO TABS
650.0000 mg | ORAL_TABLET | Freq: Once | ORAL | Status: AC
  Administered 2012-05-15: 650 mg via ORAL

## 2012-05-15 MED ORDER — DIPHENHYDRAMINE HCL 25 MG PO CAPS
25.0000 mg | ORAL_CAPSULE | Freq: Once | ORAL | Status: AC
  Administered 2012-05-15: 25 mg via ORAL

## 2012-05-15 MED ORDER — IMMUNE GLOBULIN (HUMAN) 10 GM/100ML IV SOLN: 30.0000 g | Freq: Once | INTRAVENOUS | Status: DC

## 2012-05-15 MED ORDER — IMMUNE GLOBULIN (HUMAN) 10 GM/200ML IV SOLN
30.0000 g | Freq: Once | INTRAVENOUS | Status: AC
  Administered 2012-05-15: 30 g via INTRAVENOUS
  Filled 2012-05-15: qty 600

## 2012-05-15 NOTE — Progress Notes (Signed)
Octagam started at 0915 @ rate of 0.6 mL/kg/hr= 35mL/hr X 20 mL. Rate increased to 1.2 mL/kg/hr= 79 mL/hr X 40 mL.  Vital signs stable, patient with no complaints. Rate increased to 2.4 mL/kg/hr= 159 mL/hr X 79 mL.   Vital signs stable, no complaints at this time. Rate increased to 4.2 mL/kg/hr= 265 mL/hr X 133 mL.  Vital signs stable, no complaints at this time.

## 2012-05-15 NOTE — Patient Instructions (Addendum)

## 2012-05-16 NOTE — Telephone Encounter (Signed)
Pt informed

## 2012-05-28 ENCOUNTER — Other Ambulatory Visit: Payer: Self-pay | Admitting: Internal Medicine

## 2012-06-01 ENCOUNTER — Other Ambulatory Visit: Payer: Self-pay | Admitting: Internal Medicine

## 2012-06-12 ENCOUNTER — Telehealth: Payer: Self-pay | Admitting: *Deleted

## 2012-06-12 ENCOUNTER — Ambulatory Visit: Payer: Medicare Other

## 2012-06-12 NOTE — Telephone Encounter (Signed)
Per patient request I have moved her appt from today to Friday.  JMW

## 2012-06-14 ENCOUNTER — Ambulatory Visit (HOSPITAL_BASED_OUTPATIENT_CLINIC_OR_DEPARTMENT_OTHER): Payer: Medicare Other

## 2012-06-14 VITALS — BP 109/62 | HR 62 | Temp 98.7°F | Resp 18

## 2012-06-14 DIAGNOSIS — C9110 Chronic lymphocytic leukemia of B-cell type not having achieved remission: Secondary | ICD-10-CM

## 2012-06-14 DIAGNOSIS — D801 Nonfamilial hypogammaglobulinemia: Secondary | ICD-10-CM

## 2012-06-14 MED ORDER — IMMUNE GLOBULIN (HUMAN) 10 GM/100ML IV SOLN: 30.0000 g | Freq: Once | INTRAVENOUS | Status: DC

## 2012-06-14 MED ORDER — ACETAMINOPHEN 325 MG PO TABS
650.0000 mg | ORAL_TABLET | Freq: Once | ORAL | Status: AC
  Administered 2012-06-14: 650 mg via ORAL

## 2012-06-14 MED ORDER — DIPHENHYDRAMINE HCL 25 MG PO TABS
25.0000 mg | ORAL_TABLET | Freq: Once | ORAL | Status: AC
  Administered 2012-06-14: 25 mg via ORAL
  Filled 2012-06-14: qty 1

## 2012-06-14 MED ORDER — IMMUNE GLOBULIN (HUMAN) 10 GM/200ML IV SOLN
30.0000 g | Freq: Once | INTRAVENOUS | Status: AC
  Administered 2012-06-14: 30 g via INTRAVENOUS
  Filled 2012-06-14: qty 600

## 2012-06-14 MED ORDER — SODIUM CHLORIDE 0.9 % IV SOLN
INTRAVENOUS | Status: DC
  Administered 2012-06-14: 10:00:00 via INTRAVENOUS

## 2012-06-14 NOTE — Progress Notes (Signed)
Pre octagam.  Rate started at 40 ml/kg/hr for 20 ml

## 2012-06-21 ENCOUNTER — Other Ambulatory Visit: Payer: Self-pay | Admitting: Internal Medicine

## 2012-06-24 ENCOUNTER — Other Ambulatory Visit: Payer: Self-pay | Admitting: Internal Medicine

## 2012-06-25 ENCOUNTER — Other Ambulatory Visit: Payer: Self-pay | Admitting: Internal Medicine

## 2012-07-10 ENCOUNTER — Other Ambulatory Visit: Payer: Self-pay | Admitting: *Deleted

## 2012-07-10 ENCOUNTER — Ambulatory Visit (HOSPITAL_BASED_OUTPATIENT_CLINIC_OR_DEPARTMENT_OTHER): Payer: Medicare Other

## 2012-07-10 VITALS — BP 126/80 | HR 64 | Temp 98.1°F | Resp 18

## 2012-07-10 DIAGNOSIS — C9110 Chronic lymphocytic leukemia of B-cell type not having achieved remission: Secondary | ICD-10-CM

## 2012-07-10 DIAGNOSIS — D801 Nonfamilial hypogammaglobulinemia: Secondary | ICD-10-CM

## 2012-07-10 MED ORDER — IMMUNE GLOBULIN (HUMAN) 10 GM/100ML IV SOLN: 30.0000 g | Freq: Once | INTRAVENOUS | Status: DC

## 2012-07-10 MED ORDER — IMMUNE GLOBULIN (HUMAN) 10 GM/200ML IV SOLN
30.0000 g | Freq: Once | INTRAVENOUS | Status: AC
  Administered 2012-07-10: 30 g via INTRAVENOUS
  Filled 2012-07-10: qty 600

## 2012-07-10 MED ORDER — ACETAMINOPHEN 325 MG PO TABS
650.0000 mg | ORAL_TABLET | Freq: Once | ORAL | Status: AC
  Administered 2012-07-10: 650 mg via ORAL

## 2012-07-10 MED ORDER — DIPHENHYDRAMINE HCL 25 MG PO CAPS
25.0000 mg | ORAL_CAPSULE | Freq: Once | ORAL | Status: AC
  Administered 2012-07-10: 25 mg via ORAL

## 2012-07-10 NOTE — Patient Instructions (Signed)
IMMUNE GLOBULIN (im MUNE GLOB yoo lin) helps to prevent or reduce the severity of certain infections in patients who are at risk. This medicine is collected from the pooled blood of many donors. It is used to treat immune system problems, thrombocytopenia, and Kawasaki syndrome. This medicine may be used for other purposes; ask your health care provider or pharmacist if you have questions. What should I tell my health care provider before I take this medicine? They need to know if you have any of these conditions: - diabetes - extremely low or no immune antibodies in the blood - heart disease - history of blood clots - hyperprolinemia - infection in the blood, sepsis - kidney disease - taking medicine that may change kidney function - ask your health care provider about your medicine - an unusual or allergic reaction to human immune globulin, albumin, maltose, sucrose, polysorbate 80, other medicines, foods, dyes, or preservatives - pregnant or trying to get pregnant - breast-feeding How should I use this medicine? This medicine is for injection into a muscle or infusion into a vein or skin. It is usually given by a health care professional in a hospital or clinic setting. In rare cases, some brands of this medicine might be given at home. You will be taught how to give this medicine. Use exactly as directed. Take your medicine at regular intervals. Do not take your medicine more often than directed. Talk to your pediatrician regarding the use of this medicine in children. Special care may be needed. Overdosage: If you think you have taken too much of this medicine contact a poison control center or emergency room at once. NOTE: This medicine is only for you. Do not share this medicine with others. What if I miss a dose? It is important not to miss your dose. Call your doctor or health care professional if you are unable to keep an appointment. If you give yourself the medicine and  you miss a dose, take it as soon as you can. If it is almost time for your next dose, take only that dose. Do not take double or extra doses. What may interact with this medicine? -aspirin and aspirin-like medicines -cisplatin -cyclosporine -medicines for infection like acyclovir, adefovir, amphotericin B, bacitracin, cidofovir, foscarnet, ganciclovir, gentamicin, pentamidine, vancomycin -NSAIDS, medicines for pain and inflammation, like ibuprofen or naproxen -pamidronate -vaccines -zoledronic acid This list may not describe all possible interactions. Give your health care provider a list of all the medicines, herbs, non-prescription drugs, or dietary supplements you use. Also tell them if you smoke, drink alcohol, or use illegal drugs. Some items may interact with your medicine. What should I watch for while using this medicine? Your condition will be monitored carefully while you are receiving this medicine. This medicine is made from pooled blood donations of many different people. It may be possible to pass an infection in this medicine. However, the donors are screened for infections and all products are tested for HIV and hepatitis. The medicine is treated to kill most or all bacteria and viruses. Talk to your doctor about the risks and benefits of this medicine. Do not have vaccinations for at least 14 days before, or until at least 3 months after receiving this medicine. What side effects may I notice from receiving this medicine? Side effects that you should report to your doctor or health care professional as soon as possible: -allergic reactions like skin rash, itching or hives, swelling of the face, lips, or tongue -breathing problems -  chest pain or tightness -fever, chills -headache with nausea, vomiting -neck pain or difficulty moving neck -pain when moving eyes -pain, swelling, warmth in the leg -problems with balance, talking, walking -sudden weight gain -swelling of the  ankles, feet, hands -trouble passing urine or change in the amount of urine Side effects that usually do not require medical attention (report to your doctor or health care professional if they continue or are bothersome): -dizzy, drowsy -flushing -increased sweating -leg cramps -muscle aches and pains -pain at site where injected This list may not describe all possible side effects. Call your doctor for medical advice about side effects. You may report side effects to FDA at 1-800-FDA-1088. Where should I keep my medicine? Keep out of the reach of children. This drug is usually given in a hospital or clinic and will not be stored at home. In rare cases, some brands of this medicine may be given at home. If you are using this medicine at home, you will be instructed on how to store this medicine. Throw away any unused medicine after the expiration date on the label. NOTE: This sheet is a summary. It may not cover all possible information. If you have questions about this medicine, talk to your doctor, pharmacist, or health care provider.  2012, [employer]. (09/30/2008 11:44:49 AM)

## 2012-07-28 ENCOUNTER — Other Ambulatory Visit: Payer: Self-pay | Admitting: Internal Medicine

## 2012-07-30 ENCOUNTER — Telehealth: Payer: Self-pay | Admitting: Oncology

## 2012-07-30 NOTE — Telephone Encounter (Signed)
S/W pt in re to see Kaleen Odea on 01/16 @ 8:45 pt understood. Calendar mailed.

## 2012-08-01 ENCOUNTER — Other Ambulatory Visit: Payer: Medicare Other | Admitting: Lab

## 2012-08-01 ENCOUNTER — Ambulatory Visit: Payer: Medicare Other | Admitting: Oncology

## 2012-08-08 ENCOUNTER — Other Ambulatory Visit: Payer: Medicare Other | Admitting: Lab

## 2012-08-08 ENCOUNTER — Other Ambulatory Visit (HOSPITAL_BASED_OUTPATIENT_CLINIC_OR_DEPARTMENT_OTHER): Payer: Medicare Other | Admitting: Lab

## 2012-08-08 ENCOUNTER — Telehealth: Payer: Self-pay | Admitting: *Deleted

## 2012-08-08 ENCOUNTER — Telehealth: Payer: Self-pay | Admitting: Oncology

## 2012-08-08 ENCOUNTER — Ambulatory Visit (HOSPITAL_BASED_OUTPATIENT_CLINIC_OR_DEPARTMENT_OTHER): Payer: Medicare Other | Admitting: Nurse Practitioner

## 2012-08-08 ENCOUNTER — Ambulatory Visit (HOSPITAL_BASED_OUTPATIENT_CLINIC_OR_DEPARTMENT_OTHER): Payer: Medicare Other

## 2012-08-08 VITALS — BP 117/66 | HR 68 | Temp 98.3°F | Resp 18

## 2012-08-08 VITALS — BP 126/63 | HR 67 | Temp 97.2°F | Resp 20 | Ht 64.0 in | Wt 151.7 lb

## 2012-08-08 DIAGNOSIS — C9110 Chronic lymphocytic leukemia of B-cell type not having achieved remission: Secondary | ICD-10-CM

## 2012-08-08 DIAGNOSIS — D801 Nonfamilial hypogammaglobulinemia: Secondary | ICD-10-CM

## 2012-08-08 DIAGNOSIS — D6959 Other secondary thrombocytopenia: Secondary | ICD-10-CM

## 2012-08-08 LAB — CBC WITH DIFFERENTIAL/PLATELET
BASO%: 0.2 % (ref 0.0–2.0)
EOS%: 1.6 % (ref 0.0–7.0)
HCT: 39.3 % (ref 34.8–46.6)
LYMPH%: 61.4 % — ABNORMAL HIGH (ref 14.0–49.7)
MCH: 31.1 pg (ref 25.1–34.0)
MCHC: 34.6 g/dL (ref 31.5–36.0)
MONO%: 7.4 % (ref 0.0–14.0)
NEUT%: 29.4 % — ABNORMAL LOW (ref 38.4–76.8)
Platelets: 143 10*3/uL — ABNORMAL LOW (ref 145–400)
RBC: 4.38 10*6/uL (ref 3.70–5.45)

## 2012-08-08 MED ORDER — DIPHENHYDRAMINE HCL 25 MG PO CAPS
25.0000 mg | ORAL_CAPSULE | Freq: Four times a day (QID) | ORAL | Status: DC | PRN
  Administered 2012-08-08: 25 mg via ORAL

## 2012-08-08 MED ORDER — IMMUNE GLOBULIN (HUMAN) 10 GM/200ML IV SOLN
30.0000 g | Freq: Once | INTRAVENOUS | Status: DC
  Filled 2012-08-08: qty 600

## 2012-08-08 MED ORDER — SODIUM CHLORIDE 0.9 % IV SOLN
INTRAVENOUS | Status: DC
  Administered 2012-08-08: 11:00:00 via INTRAVENOUS

## 2012-08-08 MED ORDER — ACETAMINOPHEN 325 MG PO TABS
650.0000 mg | ORAL_TABLET | Freq: Once | ORAL | Status: AC
  Administered 2012-08-08: 650 mg via ORAL

## 2012-08-08 MED ORDER — IMMUNE GLOBULIN (HUMAN) 10 GM/100ML IV SOLN
30.0000 g | Freq: Once | INTRAVENOUS | Status: DC
  Administered 2012-08-08: 30 g via INTRAVENOUS

## 2012-08-08 NOTE — Telephone Encounter (Signed)
Per staff message and POF I have scheduled appts.  JMW  

## 2012-08-08 NOTE — Telephone Encounter (Signed)
appts made and will print for pt after mw adds ivig,email to mw     anne

## 2012-08-08 NOTE — Progress Notes (Signed)
OFFICE PROGRESS NOTE  Interval history:  Ms. Wallander returns as scheduled. She continues monthly IVIG. No interim illnesses or infections. She denies fever or sweats. She has a good appetite. She is gaining weight. She reports developing swelling and redness at the injection site following the pneumococcal vaccine last year.   Objective: Blood pressure 126/63, pulse 67, temperature 97.2 F (36.2 C), temperature source Oral, resp. rate 20, height 5\' 4"  (1.626 m), weight 151 lb 11.2 oz (68.811 kg).  Oropharynx is without thrush or ulceration. No palpable cervical, supraclavicular, axillary or inguinal lymph nodes. Lungs are clear. No wheezes or rales. Regular cardiac rhythm. Abdomen is soft and nontender. No organomegaly. Extremities are without edema.  Lab Results: Lab Results  Component Value Date   WBC 5.6 08/08/2012   HGB 13.6 08/08/2012   HCT 39.3 08/08/2012   MCV 89.7 08/08/2012   PLT 143* 08/08/2012    Chemistry:    Chemistry      Component Value Date/Time   NA 131* 03/11/2011 2225   K 3.2* 03/11/2011 2225   CL 92* 03/11/2011 2225   CO2 30 03/11/2011 2225   BUN 11 03/11/2011 2225   CREATININE 0.78 03/11/2011 2225      Component Value Date/Time   CALCIUM 10.0 03/11/2011 2225   ALKPHOS 48 02/16/2010 0912   AST 25 02/16/2010 0912   ALT 20 02/16/2010 0912   BILITOT 0.8 02/16/2010 0912       Studies/Results: No results found.  Medications: I have reviewed the patient's current medications.  Assessment/Plan:  1. Chronic lymphocytic leukemia, asymptomatic. 2. History of hypogammaglobulinemia with recurrent infections. She continues monthly IVIG replacement therapy. 3. History of recurrent urinary tract infections. 4. 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 Stable.  Disposition-Ms. Cortright appears stable. Plan to continue monthly IVIG therapy. She will return for an office visit in 6 months. She will contact the office in the interim with any problems.  Plan reviewed with Dr.  Truett Perna.   Lonna Cobb ANP/GNP-BC

## 2012-08-08 NOTE — Patient Instructions (Signed)
Patient aware of next appointment; discharged home with no complaints. 

## 2012-08-09 ENCOUNTER — Other Ambulatory Visit: Payer: Self-pay | Admitting: Internal Medicine

## 2012-08-09 ENCOUNTER — Telehealth: Payer: Self-pay | Admitting: Oncology

## 2012-08-09 ENCOUNTER — Ambulatory Visit: Payer: Medicare Other

## 2012-08-09 NOTE — Telephone Encounter (Signed)
s/w pt and she is aware of her appts as a sch was printed in the back for her   [name]

## 2012-08-15 ENCOUNTER — Other Ambulatory Visit: Payer: Self-pay | Admitting: Internal Medicine

## 2012-08-17 ENCOUNTER — Other Ambulatory Visit: Payer: Self-pay | Admitting: Internal Medicine

## 2012-08-19 ENCOUNTER — Other Ambulatory Visit: Payer: Self-pay | Admitting: Internal Medicine

## 2012-09-03 ENCOUNTER — Other Ambulatory Visit: Payer: Self-pay | Admitting: Internal Medicine

## 2012-09-04 ENCOUNTER — Ambulatory Visit (HOSPITAL_BASED_OUTPATIENT_CLINIC_OR_DEPARTMENT_OTHER): Payer: Medicare Other

## 2012-09-04 ENCOUNTER — Other Ambulatory Visit: Payer: Self-pay | Admitting: Oncology

## 2012-09-04 ENCOUNTER — Other Ambulatory Visit: Payer: Self-pay | Admitting: Internal Medicine

## 2012-09-04 ENCOUNTER — Other Ambulatory Visit: Payer: Medicare Other | Admitting: Lab

## 2012-09-04 VITALS — BP 117/73 | HR 64 | Temp 97.5°F | Resp 18 | Ht 64.0 in | Wt 151.0 lb

## 2012-09-04 DIAGNOSIS — C9110 Chronic lymphocytic leukemia of B-cell type not having achieved remission: Secondary | ICD-10-CM

## 2012-09-04 DIAGNOSIS — D801 Nonfamilial hypogammaglobulinemia: Secondary | ICD-10-CM

## 2012-09-04 MED ORDER — IMMUNE GLOBULIN (HUMAN) 10 GM/100ML IV SOLN
30.0000 g | Freq: Once | INTRAVENOUS | Status: DC
  Filled 2012-09-04: qty 300

## 2012-09-04 MED ORDER — IMMUNE GLOBULIN (HUMAN) 10 GM/200ML IV SOLN
450.0000 mg/kg | Freq: Once | INTRAVENOUS | Status: AC
  Administered 2012-09-04: 30 g via INTRAVENOUS
  Filled 2012-09-04: qty 600

## 2012-09-04 MED ORDER — DIPHENHYDRAMINE HCL 25 MG PO CAPS
25.0000 mg | ORAL_CAPSULE | Freq: Once | ORAL | Status: AC
  Administered 2012-09-04: 25 mg via ORAL

## 2012-09-04 MED ORDER — SODIUM CHLORIDE 0.9 % IJ SOLN
10.0000 mL | INTRAMUSCULAR | Status: DC | PRN
  Filled 2012-09-04: qty 10

## 2012-09-05 ENCOUNTER — Ambulatory Visit: Payer: Medicare Other | Admitting: Internal Medicine

## 2012-10-03 ENCOUNTER — Ambulatory Visit (HOSPITAL_BASED_OUTPATIENT_CLINIC_OR_DEPARTMENT_OTHER): Payer: Medicare Other

## 2012-10-03 ENCOUNTER — Other Ambulatory Visit: Payer: Medicare Other | Admitting: Lab

## 2012-10-03 VITALS — BP 133/81 | HR 54 | Temp 97.8°F | Resp 16

## 2012-10-03 DIAGNOSIS — C9110 Chronic lymphocytic leukemia of B-cell type not having achieved remission: Secondary | ICD-10-CM

## 2012-10-03 MED ORDER — DIPHENHYDRAMINE HCL 25 MG PO CAPS
25.0000 mg | ORAL_CAPSULE | Freq: Once | ORAL | Status: AC
  Administered 2012-10-03: 25 mg via ORAL

## 2012-10-03 MED ORDER — IMMUNE GLOBULIN (HUMAN) 10 GM/100ML IV SOLN: 30.0000 g | Freq: Once | INTRAVENOUS | Status: DC

## 2012-10-03 MED ORDER — IMMUNE GLOBULIN (HUMAN) 10 GM/200ML IV SOLN
450.0000 mg/kg | Freq: Once | INTRAVENOUS | Status: AC
  Administered 2012-10-03: 30 g via INTRAVENOUS
  Filled 2012-10-03: qty 600

## 2012-10-03 MED ORDER — ACETAMINOPHEN 325 MG PO TABS
650.0000 mg | ORAL_TABLET | Freq: Once | ORAL | Status: AC
  Administered 2012-10-03: 650 mg via ORAL

## 2012-10-03 NOTE — Patient Instructions (Addendum)
Immune Globulin Injection What is this medicine? IMMUNE GLOBULIN (im MUNE GLOB yoo lin) helps to prevent or reduce the severity of certain infections in patients who are at risk. This medicine is collected from the pooled blood of many donors. It is used to treat immune system problems, thrombocytopenia, and Kawasaki syndrome. This medicine may be used for other purposes; ask your health care Kerry Odonohue or pharmacist if you have questions. What should I tell my health care Damonte Frieson before I take this medicine? They need to know if you have any of these conditions: - diabetes - extremely low or no immune antibodies in the blood - heart disease - history of blood clots - hyperprolinemia - infection in the blood, sepsis - kidney disease - taking medicine that may change kidney function - ask your health care Astin Sayre about your medicine - an unusual or allergic reaction to human immune globulin, albumin, maltose, sucrose, polysorbate 80, other medicines, foods, dyes, or preservatives - pregnant or trying to get pregnant - breast-feeding How should I use this medicine? This medicine is for injection into a muscle or infusion into a vein or skin. It is usually given by a health care professional in a hospital or clinic setting. In rare cases, some brands of this medicine might be given at home. You will be taught how to give this medicine. Use exactly as directed. Take your medicine at regular intervals. Do not take your medicine more often than directed. Talk to your pediatrician regarding the use of this medicine in children. Special care may be needed. Overdosage: If you think you have taken too much of this medicine contact a poison control center or emergency room at once. NOTE: This medicine is only for you. Do not share this medicine with others. What if I miss a dose? It is important not to miss your dose. Call your doctor or health care professional if you are unable to keep an  appointment. If you give yourself the medicine and you miss a dose, take it as soon as you can. If it is almost time for your next dose, take only that dose. Do not take double or extra doses. What may interact with this medicine? -aspirin and aspirin-like medicines -cisplatin -cyclosporine -medicines for infection like acyclovir, adefovir, amphotericin B, bacitracin, cidofovir, foscarnet, ganciclovir, gentamicin, pentamidine, vancomycin -NSAIDS, medicines for pain and inflammation, like ibuprofen or naproxen -pamidronate -vaccines -zoledronic acid This list may not describe all possible interactions. Give your health care Azra Abrell a list of all the medicines, herbs, non-prescription drugs, or dietary supplements you use. Also tell them if you smoke, drink alcohol, or use illegal drugs. Some items may interact with your medicine. What should I watch for while using this medicine? Your condition will be monitored carefully while you are receiving this medicine. This medicine is made from pooled blood donations of many different people. It may be possible to pass an infection in this medicine. However, the donors are screened for infections and all products are tested for HIV and hepatitis. The medicine is treated to kill most or all bacteria and viruses. Talk to your doctor about the risks and benefits of this medicine. Do not have vaccinations for at least 14 days before, or until at least 3 months after receiving this medicine. What side effects may I notice from receiving this medicine? Side effects that you should report to your doctor or health care professional as soon as possible: -allergic reactions like skin rash, itching or hives, swelling of   the face, lips, or tongue -breathing problems -chest pain or tightness -fever, chills -headache with nausea, vomiting -neck pain or difficulty moving neck -pain when moving eyes -pain, swelling, warmth in the leg -problems with balance,  talking, walking -sudden weight gain -swelling of the ankles, feet, hands -trouble passing urine or change in the amount of urine Side effects that usually do not require medical attention (report to your doctor or health care professional if they continue or are bothersome): -dizzy, drowsy -flushing -increased sweating -leg cramps -muscle aches and pains -pain at site where injected This list may not describe all possible side effects. Call your doctor for medical advice about side effects. You may report side effects to FDA at 1-800-FDA-1088. Where should I keep my medicine? Keep out of the reach of children. This drug is usually given in a hospital or clinic and will not be stored at home. In rare cases, some brands of this medicine may be given at home. If you are using this medicine at home, you will be instructed on how to store this medicine. Throw away any unused medicine after the expiration date on the label. NOTE: This sheet is a summary. It may not cover all possible information. If you have questions about this medicine, talk to your doctor, pharmacist, or health care Ciria Bernardini.  2012, [employer]. (09/30/2008 11:44:49 AM) 

## 2012-10-07 ENCOUNTER — Other Ambulatory Visit: Payer: Self-pay | Admitting: Internal Medicine

## 2012-10-07 NOTE — Telephone Encounter (Signed)
The pt is hoping to get a refill of her B-12 medicine.  Her call back number [phone].  She states you can leave a msg on her machine.

## 2012-10-09 MED ORDER — CYANOCOBALAMIN 500 MCG/0.1ML NA SOLN: 500.0000 ug | NASAL | Status: DC

## 2012-10-09 NOTE — Telephone Encounter (Signed)
Nascobal - see Rx Thx

## 2012-10-09 NOTE — Telephone Encounter (Signed)
B12 inj is on manufacturer back order. What can it be changed to?

## 2012-10-09 NOTE — Telephone Encounter (Signed)
Pt informed

## 2012-10-17 ENCOUNTER — Encounter: Payer: Self-pay | Admitting: Internal Medicine

## 2012-10-17 ENCOUNTER — Ambulatory Visit (INDEPENDENT_AMBULATORY_CARE_PROVIDER_SITE_OTHER): Payer: Medicare Other | Admitting: Internal Medicine

## 2012-10-17 VITALS — BP 110/74 | HR 76 | Temp 98.0°F | Resp 16 | Wt 150.0 lb

## 2012-10-17 DIAGNOSIS — F419 Anxiety disorder, unspecified: Secondary | ICD-10-CM

## 2012-10-17 DIAGNOSIS — I10 Essential (primary) hypertension: Secondary | ICD-10-CM

## 2012-10-17 DIAGNOSIS — G609 Hereditary and idiopathic neuropathy, unspecified: Secondary | ICD-10-CM

## 2012-10-17 DIAGNOSIS — E538 Deficiency of other specified B group vitamins: Secondary | ICD-10-CM

## 2012-10-17 DIAGNOSIS — C9110 Chronic lymphocytic leukemia of B-cell type not having achieved remission: Secondary | ICD-10-CM

## 2012-10-17 DIAGNOSIS — F411 Generalized anxiety disorder: Secondary | ICD-10-CM

## 2012-10-17 DIAGNOSIS — R5381 Other malaise: Secondary | ICD-10-CM

## 2012-10-17 DIAGNOSIS — E785 Hyperlipidemia, unspecified: Secondary | ICD-10-CM

## 2012-10-17 MED ORDER — CYANOCOBALAMIN 1000 MCG/ML IJ SOLN
1000.0000 ug | Freq: Once | INTRAMUSCULAR | Status: AC
  Administered 2012-10-17: 1000 ug via INTRAMUSCULAR

## 2012-10-17 MED ORDER — VITAMIN B-12 1000 MCG SL SUBL: 1.0000 | SUBLINGUAL_TABLET | Freq: Every day | SUBLINGUAL | Status: DC

## 2012-10-17 NOTE — Progress Notes (Signed)
Subjective:    HPI   The patient presents for a follow-up of  chronic hypertension, chronic dyslipidemia, anxiety (mild) controlled with medicines; halitosis. BP is good home. Lost some wt. F/u Vit B12 def - Nascobal was $475!  Her 2nd son had a CVA in 2.13  BP Readings from Last 3 Encounters:  10/17/12 110/74  10/03/12 133/81  09/04/12 117/73   Wt Readings from Last 3 Encounters:  10/17/12 150 lb (68.04 kg)  09/04/12 151 lb (68.493 kg)  08/08/12 151 lb 11.2 oz (68.811 kg)       Review of Systems  Constitutional: Positive for fatigue. Negative for fever, chills, diaphoresis, activity change, appetite change and unexpected weight change.  HENT: Negative for hearing loss, ear pain, nosebleeds, congestion, sore throat, facial swelling, rhinorrhea, sneezing, mouth sores, trouble swallowing, neck pain, neck stiffness, postnasal drip, sinus pressure and tinnitus.   Eyes: Negative for pain, discharge, redness, itching and visual disturbance.  Respiratory: Negative for cough, chest tightness, shortness of breath, wheezing and stridor.   Cardiovascular: Negative for chest pain, palpitations and leg swelling.  Gastrointestinal: Negative for nausea, diarrhea, constipation, blood in stool, abdominal distention, anal bleeding and rectal pain.  Genitourinary: Negative for dysuria, urgency, frequency, hematuria, flank pain, vaginal bleeding, vaginal discharge, difficulty urinating, genital sores and pelvic pain.  Musculoskeletal: Negative for back pain, joint swelling, arthralgias and gait problem.  Skin: Negative.  Negative for rash.  Neurological: Negative for dizziness, tremors, seizures, syncope, speech difficulty, weakness, numbness and headaches.  Hematological: Negative for adenopathy. Does not bruise/bleed easily.  Psychiatric/Behavioral: Negative for suicidal ideas, behavioral problems, sleep disturbance, dysphoric mood and decreased concentration. The patient is nervous/anxious.         Objective:   Physical Exam  Constitutional: She appears well-developed. No distress.  obese  HENT:  Head: Normocephalic.  Right Ear: External ear normal.  Left Ear: External ear normal.  Nose: Nose normal.  Mouth/Throat: Oropharynx is clear and moist.  No halitosis  Eyes: Conjunctivae are normal. Pupils are equal, round, and reactive to light. Right eye exhibits no discharge. Left eye exhibits no discharge.  Neck: Normal range of motion. Neck supple. No JVD present. No tracheal deviation present. No thyromegaly present.  Cardiovascular: Normal rate, regular rhythm and normal heart sounds.   Pulmonary/Chest: No stridor. No respiratory distress. She has no wheezes.  Abdominal: Soft. Bowel sounds are normal. She exhibits no distension and no mass. There is no tenderness. There is no rebound and no guarding.  Musculoskeletal: She exhibits no edema and no tenderness.  Lymphadenopathy:    She has no cervical adenopathy.  Neurological: She displays normal reflexes. No cranial nerve deficit. She exhibits normal muscle tone. Coordination normal.  Skin: No rash noted. No erythema.  Psychiatric: She has a normal mood and affect. Her behavior is normal. Judgment and thought content normal.   Lab Results  Component Value Date   WBC 5.6 08/08/2012   HGB 13.6 08/08/2012   HCT 39.3 08/08/2012   PLT 143* 08/08/2012   GLUCOSE 125* 03/11/2011   CHOL 158 12/07/2010   TRIG 92.0 12/07/2010   HDL 57.40 12/07/2010   LDLCALC 82 12/07/2010   ALT 20 02/16/2010   AST 25 02/16/2010   NA 131* 03/11/2011   K 3.2* 03/11/2011   CL 92* 03/11/2011   CREATININE 0.78 03/11/2011   BUN 11 03/11/2011   CO2 30 03/11/2011   TSH 2.20 02/16/2010   INR 1.0 10/07/2008   HGBA1C  Value: 5.9 (NOTE)  The ADA recommends the following therapeutic goal for glycemic   control related to Hgb A1C measurement:   Goal of Therapy:   < 7.0% Hgb A1C   Reference: American Diabetes Association: Clinical Practice   Recommendations 2008,  Diabetes Care,  2008, 31:(Suppl 1). 10/07/2008   MICROALBUR 0.4 07/14/2009          Assessment & Plan:

## 2012-10-17 NOTE — Assessment & Plan Note (Signed)
Continue with current prescription therapy as reflected on the Med list.  

## 2012-10-17 NOTE — Assessment & Plan Note (Signed)
Continue with current prescription therapy as reflected on the Med list. Labs  

## 2012-10-17 NOTE — Assessment & Plan Note (Signed)
Doing OK 

## 2012-10-17 NOTE — Assessment & Plan Note (Signed)
Start SL Vit B12 1000 mcg/d

## 2012-10-17 NOTE — Assessment & Plan Note (Signed)
Labs

## 2012-10-17 NOTE — Assessment & Plan Note (Signed)
Continue with Hem Onc f/u

## 2012-10-31 ENCOUNTER — Ambulatory Visit (HOSPITAL_BASED_OUTPATIENT_CLINIC_OR_DEPARTMENT_OTHER): Payer: Medicare Other

## 2012-10-31 ENCOUNTER — Other Ambulatory Visit (HOSPITAL_BASED_OUTPATIENT_CLINIC_OR_DEPARTMENT_OTHER): Payer: Medicare Other | Admitting: Lab

## 2012-10-31 VITALS — BP 121/58 | HR 63 | Temp 98.5°F | Resp 18

## 2012-10-31 DIAGNOSIS — D801 Nonfamilial hypogammaglobulinemia: Secondary | ICD-10-CM

## 2012-10-31 DIAGNOSIS — C9110 Chronic lymphocytic leukemia of B-cell type not having achieved remission: Secondary | ICD-10-CM

## 2012-10-31 LAB — CBC WITH DIFFERENTIAL/PLATELET
Basophils Absolute: 0 10*3/uL (ref 0.0–0.1)
Eosinophils Absolute: 0.1 10*3/uL (ref 0.0–0.5)
HGB: 13.8 g/dL (ref 11.6–15.9)
MCV: 89.2 fL (ref 79.5–101.0)
MONO#: 0.5 10*3/uL (ref 0.1–0.9)
NEUT#: 3.1 10*3/uL (ref 1.5–6.5)
RDW: 13.5 % (ref 11.2–14.5)
WBC: 6.7 10*3/uL (ref 3.9–10.3)
lymph#: 2.9 10*3/uL (ref 0.9–3.3)

## 2012-10-31 MED ORDER — IMMUNE GLOBULIN (HUMAN) 10 GM/200ML IV SOLN
30.0000 g | Freq: Once | INTRAVENOUS | Status: AC
  Administered 2012-10-31: 30 g via INTRAVENOUS
  Filled 2012-10-31: qty 600

## 2012-10-31 MED ORDER — IMMUNE GLOBULIN (HUMAN) 10 GM/100ML IV SOLN
30.0000 g | Freq: Once | INTRAVENOUS | Status: DC
  Filled 2012-10-31: qty 300

## 2012-10-31 MED ORDER — IMMUNE GLOBULIN (HUMAN) 10 GM/100ML IV SOLN: 30.0000 g | Freq: Once | INTRAVENOUS | Status: DC

## 2012-10-31 MED ORDER — ACETAMINOPHEN 325 MG PO TABS
650.0000 mg | ORAL_TABLET | Freq: Once | ORAL | Status: AC
  Administered 2012-10-31: 650 mg via ORAL

## 2012-10-31 MED ORDER — DIPHENHYDRAMINE HCL 25 MG PO CAPS
25.0000 mg | ORAL_CAPSULE | Freq: Once | ORAL | Status: AC
  Administered 2012-10-31: 25 mg via ORAL

## 2012-10-31 NOTE — Patient Instructions (Signed)

## 2012-10-31 NOTE — Progress Notes (Signed)
Patient observed for 30 minutes post infusion. Vitals stable no complaints patient discharged home.

## 2012-11-14 ENCOUNTER — Ambulatory Visit: Payer: Medicare Other

## 2012-11-28 ENCOUNTER — Ambulatory Visit (HOSPITAL_BASED_OUTPATIENT_CLINIC_OR_DEPARTMENT_OTHER): Payer: Medicare Other

## 2012-11-28 ENCOUNTER — Other Ambulatory Visit: Payer: Medicare Other | Admitting: Lab

## 2012-11-28 VITALS — BP 124/68 | HR 59 | Temp 97.6°F | Resp 18

## 2012-11-28 DIAGNOSIS — D801 Nonfamilial hypogammaglobulinemia: Secondary | ICD-10-CM

## 2012-11-28 DIAGNOSIS — C9110 Chronic lymphocytic leukemia of B-cell type not having achieved remission: Secondary | ICD-10-CM

## 2012-11-28 MED ORDER — IMMUNE GLOBULIN (HUMAN) 10 GM/100ML IV SOLN: 30.0000 g | Freq: Once | INTRAVENOUS | Status: DC

## 2012-11-28 MED ORDER — DIPHENHYDRAMINE HCL 25 MG PO CAPS
25.0000 mg | ORAL_CAPSULE | Freq: Once | ORAL | Status: AC
  Administered 2012-11-28: 25 mg via ORAL

## 2012-11-28 MED ORDER — IMMUNE GLOBULIN (HUMAN) 5 GM/100ML IV SOLN: 400.0000 mg/kg | Freq: Once | INTRAVENOUS | Status: DC

## 2012-11-28 MED ORDER — IMMUNE GLOBULIN (HUMAN) 10 GM/200ML IV SOLN
30.0000 g | Freq: Once | INTRAVENOUS | Status: AC
  Administered 2012-11-28: 30 g via INTRAVENOUS
  Filled 2012-11-28: qty 600

## 2012-11-28 NOTE — Patient Instructions (Signed)

## 2012-12-26 ENCOUNTER — Other Ambulatory Visit: Payer: Medicare Other | Admitting: Lab

## 2012-12-26 ENCOUNTER — Ambulatory Visit (HOSPITAL_BASED_OUTPATIENT_CLINIC_OR_DEPARTMENT_OTHER): Payer: Medicare Other

## 2012-12-26 VITALS — BP 134/63 | HR 69 | Temp 98.3°F | Resp 18

## 2012-12-26 DIAGNOSIS — C9110 Chronic lymphocytic leukemia of B-cell type not having achieved remission: Secondary | ICD-10-CM

## 2012-12-26 DIAGNOSIS — D801 Nonfamilial hypogammaglobulinemia: Secondary | ICD-10-CM

## 2012-12-26 MED ORDER — DIPHENHYDRAMINE HCL 25 MG PO CAPS
25.0000 mg | ORAL_CAPSULE | Freq: Once | ORAL | Status: AC
  Administered 2012-12-26: 25 mg via ORAL

## 2012-12-26 MED ORDER — IMMUNE GLOBULIN (HUMAN) 10 GM/100ML IV SOLN: 30.0000 g | Freq: Once | INTRAVENOUS | Status: DC

## 2012-12-26 MED ORDER — IMMUNE GLOBULIN (HUMAN) 10 GM/200ML IV SOLN
400.0000 mg/kg | Freq: Once | INTRAVENOUS | Status: AC
  Administered 2012-12-26: 30 g via INTRAVENOUS
  Filled 2012-12-26: qty 600

## 2012-12-26 NOTE — Patient Instructions (Signed)
Today you received IVIG. Call for any problems.

## 2013-01-16 ENCOUNTER — Ambulatory Visit: Payer: Medicare Other | Admitting: Internal Medicine

## 2013-01-23 ENCOUNTER — Telehealth: Payer: Self-pay | Admitting: Oncology

## 2013-01-23 ENCOUNTER — Ambulatory Visit (HOSPITAL_BASED_OUTPATIENT_CLINIC_OR_DEPARTMENT_OTHER): Payer: Medicare Other

## 2013-01-23 ENCOUNTER — Other Ambulatory Visit (HOSPITAL_BASED_OUTPATIENT_CLINIC_OR_DEPARTMENT_OTHER): Payer: Medicare Other | Admitting: Lab

## 2013-01-23 ENCOUNTER — Ambulatory Visit (HOSPITAL_BASED_OUTPATIENT_CLINIC_OR_DEPARTMENT_OTHER): Payer: Medicare Other | Admitting: Oncology

## 2013-01-23 ENCOUNTER — Telehealth: Payer: Self-pay | Admitting: *Deleted

## 2013-01-23 VITALS — BP 132/65 | HR 64 | Temp 97.9°F | Resp 18 | Ht 64.0 in | Wt 150.1 lb

## 2013-01-23 VITALS — BP 154/81 | HR 67 | Temp 97.4°F | Resp 18

## 2013-01-23 DIAGNOSIS — C9110 Chronic lymphocytic leukemia of B-cell type not having achieved remission: Secondary | ICD-10-CM

## 2013-01-23 DIAGNOSIS — Z87440 Personal history of urinary (tract) infections: Secondary | ICD-10-CM

## 2013-01-23 DIAGNOSIS — D6959 Other secondary thrombocytopenia: Secondary | ICD-10-CM

## 2013-01-23 DIAGNOSIS — D801 Nonfamilial hypogammaglobulinemia: Secondary | ICD-10-CM

## 2013-01-23 LAB — CBC WITH DIFFERENTIAL/PLATELET
Eosinophils Absolute: 0.1 10*3/uL (ref 0.0–0.5)
HCT: 38.5 % (ref 34.8–46.6)
LYMPH%: 49.3 % (ref 14.0–49.7)
MCV: 90.4 fL (ref 79.5–101.0)
MONO#: 0.4 10*3/uL (ref 0.1–0.9)
MONO%: 6.7 % (ref 0.0–14.0)
NEUT#: 2.5 10*3/uL (ref 1.5–6.5)
NEUT%: 41.7 % (ref 38.4–76.8)
Platelets: 144 10*3/uL — ABNORMAL LOW (ref 145–400)
WBC: 6 10*3/uL (ref 3.9–10.3)

## 2013-01-23 MED ORDER — IMMUNE GLOBULIN (HUMAN) 10 GM/200ML IV SOLN
400.0000 mg/kg | Freq: Once | INTRAVENOUS | Status: AC
  Administered 2013-01-23: 30 g via INTRAVENOUS
  Filled 2013-01-23: qty 600

## 2013-01-23 MED ORDER — DIPHENHYDRAMINE HCL 25 MG PO CAPS
25.0000 mg | ORAL_CAPSULE | Freq: Once | ORAL | Status: AC
  Administered 2013-01-23: 25 mg via ORAL

## 2013-01-23 MED ORDER — IMMUNE GLOBULIN (HUMAN) 10 GM/100ML IV SOLN
30.0000 g | Freq: Once | INTRAVENOUS | Status: DC
  Filled 2013-01-23: qty 300

## 2013-01-23 NOTE — Patient Instructions (Addendum)
IMMUNE GLOBULIN (im MUNE GLOB yoo lin) helps to prevent or reduce the severity of certain infections in patients who are at risk. This medicine is collected from the pooled blood of many donors. It is used to treat immune system problems, thrombocytopenia, and Kawasaki syndrome. This medicine may be used for other purposes; ask your health care provider or pharmacist if you have questions. What should I tell my health care provider before I take this medicine? They need to know if you have any of these conditions: - diabetes - extremely low or no immune antibodies in the blood - heart disease - history of blood clots - hyperprolinemia - infection in the blood, sepsis - kidney disease - taking medicine that may change kidney function - ask your health care provider about your medicine - an unusual or allergic reaction to human immune globulin, albumin, maltose, sucrose, polysorbate 80, other medicines, foods, dyes, or preservatives - pregnant or trying to get pregnant - breast-feeding How should I use this medicine? This medicine is for injection into a muscle or infusion into a vein or skin. It is usually given by a health care professional in a hospital or clinic setting. In rare cases, some brands of this medicine might be given at home. You will be taught how to give this medicine. Use exactly as directed. Take your medicine at regular intervals. Do not take your medicine more often than directed. Talk to your pediatrician regarding the use of this medicine in children. Special care may be needed. Overdosage: If you think you have taken too much of this medicine contact a poison control center or emergency room at once. NOTE: This medicine is only for you. Do not share this medicine with others. What if I miss a dose? It is important not to miss your dose. Call your doctor or health care professional if you are unable to keep an appointment. If you give yourself the medicine and  you miss a dose, take it as soon as you can. If it is almost time for your next dose, take only that dose. Do not take double or extra doses. What may interact with this medicine? -aspirin and aspirin-like medicines -cisplatin -cyclosporine -medicines for infection like acyclovir, adefovir, amphotericin B, bacitracin, cidofovir, foscarnet, ganciclovir, gentamicin, pentamidine, vancomycin -NSAIDS, medicines for pain and inflammation, like ibuprofen or naproxen -pamidronate -vaccines -zoledronic acid This list may not describe all possible interactions. Give your health care provider a list of all the medicines, herbs, non-prescription drugs, or dietary supplements you use. Also tell them if you smoke, drink alcohol, or use illegal drugs. Some items may interact with your medicine. What should I watch for while using this medicine? Your condition will be monitored carefully while you are receiving this medicine. This medicine is made from pooled blood donations of many different people. It may be possible to pass an infection in this medicine. However, the donors are screened for infections and all products are tested for HIV and hepatitis. The medicine is treated to kill most or all bacteria and viruses. Talk to your doctor about the risks and benefits of this medicine. Do not have vaccinations for at least 14 days before, or until at least 3 months after receiving this medicine. What side effects may I notice from receiving this medicine? Side effects that you should report to your doctor or health care professional as soon as possible: -allergic reactions like skin rash, itching or hives, swelling of the face, lips, or tongue -breathing problems -  chest pain or tightness -fever, chills -headache with nausea, vomiting -neck pain or difficulty moving neck -pain when moving eyes -pain, swelling, warmth in the leg -problems with balance, talking, walking -sudden weight gain -swelling of the  ankles, feet, hands -trouble passing urine or change in the amount of urine Side effects that usually do not require medical attention (report to your doctor or health care professional if they continue or are bothersome): -dizzy, drowsy -flushing -increased sweating -leg cramps -muscle aches and pains -pain at site where injected This list may not describe all possible side effects. Call your doctor for medical advice about side effects. You may report side effects to FDA at 1-800-FDA-1088. Where should I keep my medicine? Keep out of the reach of children. This drug is usually given in a hospital or clinic and will not be stored at home. In rare cases, some brands of this medicine may be given at home. If you are using this medicine at home, you will be instructed on how to store this medicine. Throw away any unused medicine after the expiration date on the label. NOTE: This sheet is a summary. It may not cover all possible information. If you have questions about this medicine, talk to your doctor, pharmacist, or health care provider.  2012, [employer]. (09/30/2008 11:44:49 AM) 

## 2013-01-23 NOTE — Progress Notes (Signed)
   [facility]    OFFICE PROGRESS NOTE   INTERVAL HISTORY:   [name] returns as scheduled. She continues monthly IVIG. She is tolerating the IVIG well. No recent infection. She feels the IVIG has prevented infections. She has noted a raised area at the parietal scalp for the past month.  Objective:  Vital signs in last 24 hours:  Blood pressure 132/65, pulse 64, temperature 97.9 F (36.6 C), temperature source Oral, resp. rate 18, height 5\' 4"  (1.626 m), weight 150 lb 1.6 oz (68.085 kg), SpO2 98.00%.    HEENT: Neck without mass Lymphatics: No cervical, supraclavicular, or axillar nodes Resp: Lungs clear bilaterally Cardio: Regular rate and rhythm GI: No hepatosplenomegaly Vascular: No leg edema  Skin: At the mid parietal scalp there is a less than 1 cm tan slightly raised mole     Lab Results:  Lab Results  Component Value Date   WBC 6.0 01/23/2013   HGB 13.4 01/23/2013   HCT 38.5 01/23/2013   MCV 90.4 01/23/2013   PLT 144* 01/23/2013   ANC 2.5    Medications: I have reviewed the patient's current medications.  Assessment/Plan: 1. Chronic lymphocytic leukemia, asymptomatic. 2. History of hypogammaglobulinemia with recurrent infections. She continues monthly IVIG replacement therapy. 3. History of recurrent urinary tract infections. 4. 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 Stable.   Disposition:  She appears stable. The plan is to continue monthly IVIG. She will be scheduled for an office visit in 6 months.  The parietal scalp lesion appears to be a benign mole. She will ask her primary physician to evaluate this.   Thornton Papas, MD  01/23/2013  6:47 PM

## 2013-01-23 NOTE — Telephone Encounter (Signed)
Called pt and left message for IVIG from [facility] to january 2014

## 2013-01-23 NOTE — Telephone Encounter (Signed)
Per staff message and POF I have scheduled appts.  JMW  

## 2013-01-23 NOTE — Telephone Encounter (Signed)
Gave pt appt for Juanuary 2015 lab and MD, printed AVS, emailed Marcelino Duster regarding  IVIG every month

## 2013-01-28 ENCOUNTER — Telehealth: Payer: Self-pay | Admitting: Oncology

## 2013-01-28 NOTE — Telephone Encounter (Signed)
talked to pt and gave her appt for July and August 2014

## 2013-01-29 ENCOUNTER — Other Ambulatory Visit (INDEPENDENT_AMBULATORY_CARE_PROVIDER_SITE_OTHER): Payer: Medicare Other

## 2013-01-29 DIAGNOSIS — G609 Hereditary and idiopathic neuropathy, unspecified: Secondary | ICD-10-CM

## 2013-01-29 DIAGNOSIS — E785 Hyperlipidemia, unspecified: Secondary | ICD-10-CM

## 2013-01-29 DIAGNOSIS — R5381 Other malaise: Secondary | ICD-10-CM

## 2013-01-29 DIAGNOSIS — E538 Deficiency of other specified B group vitamins: Secondary | ICD-10-CM

## 2013-01-29 DIAGNOSIS — F419 Anxiety disorder, unspecified: Secondary | ICD-10-CM

## 2013-01-29 DIAGNOSIS — I10 Essential (primary) hypertension: Secondary | ICD-10-CM

## 2013-01-29 DIAGNOSIS — C9110 Chronic lymphocytic leukemia of B-cell type not having achieved remission: Secondary | ICD-10-CM

## 2013-01-29 DIAGNOSIS — F411 Generalized anxiety disorder: Secondary | ICD-10-CM

## 2013-01-29 LAB — CBC WITH DIFFERENTIAL/PLATELET
Basophils Relative: 0.5 % (ref 0.0–3.0)
Eosinophils Absolute: 0.1 10*3/uL (ref 0.0–0.7)
Hemoglobin: 14 g/dL (ref 12.0–15.0)
Lymphocytes Relative: 49.8 % — ABNORMAL HIGH (ref 12.0–46.0)
MCHC: 34.1 g/dL (ref 30.0–36.0)
Neutro Abs: 2.6 10*3/uL (ref 1.4–7.7)
RBC: 4.36 Mil/uL (ref 3.87–5.11)

## 2013-01-29 LAB — BASIC METABOLIC PANEL
BUN: 14 mg/dL (ref 6–23)
CO2: 31 mEq/L (ref 19–32)
Calcium: 9.6 mg/dL (ref 8.4–10.5)
Creatinine, Ser: 0.8 mg/dL (ref 0.4–1.2)
Glucose, Bld: 88 mg/dL (ref 70–99)

## 2013-01-29 LAB — URINALYSIS
Ketones, ur: NEGATIVE
Leukocytes, UA: NEGATIVE
Specific Gravity, Urine: <=1.005 (ref 1.000–1.030)
Total Protein, Urine: NEGATIVE
Urine Glucose: NEGATIVE
pH: 7 (ref 5.0–8.0)

## 2013-01-29 LAB — HEPATIC FUNCTION PANEL
AST: 22 U/L (ref 0–37)
Alkaline Phosphatase: 48 U/L (ref 39–117)
Bilirubin, Direct: 0.1 mg/dL (ref 0.0–0.3)
Total Bilirubin: 0.6 mg/dL (ref 0.3–1.2)

## 2013-01-29 LAB — LIPID PANEL: Total CHOL/HDL Ratio: 3

## 2013-01-29 LAB — TSH: TSH: 3.21 u[IU]/mL (ref 0.35–5.50)

## 2013-01-30 ENCOUNTER — Ambulatory Visit (INDEPENDENT_AMBULATORY_CARE_PROVIDER_SITE_OTHER): Payer: Medicare Other | Admitting: Internal Medicine

## 2013-01-30 ENCOUNTER — Encounter: Payer: Self-pay | Admitting: Internal Medicine

## 2013-01-30 VITALS — BP 130/84 | HR 68 | Temp 97.6°F | Resp 16 | Wt 149.0 lb

## 2013-01-30 DIAGNOSIS — F419 Anxiety disorder, unspecified: Secondary | ICD-10-CM

## 2013-01-30 DIAGNOSIS — G609 Hereditary and idiopathic neuropathy, unspecified: Secondary | ICD-10-CM

## 2013-01-30 DIAGNOSIS — F411 Generalized anxiety disorder: Secondary | ICD-10-CM

## 2013-01-30 DIAGNOSIS — E538 Deficiency of other specified B group vitamins: Secondary | ICD-10-CM

## 2013-01-30 DIAGNOSIS — E785 Hyperlipidemia, unspecified: Secondary | ICD-10-CM

## 2013-01-30 DIAGNOSIS — I10 Essential (primary) hypertension: Secondary | ICD-10-CM

## 2013-01-30 NOTE — Assessment & Plan Note (Signed)
Continue with current prescription therapy as reflected on the Med list.  

## 2013-01-30 NOTE — Progress Notes (Signed)
Subjective:    HPI   The patient presents for a follow-up of  chronic hypertension, chronic dyslipidemia, anxiety (mild) controlled with medicines; halitosis. BP is good home. Lost some wt. F/u Vit B12 def - Nascobal was $$$  Her 2nd son had a CVA in 2.13  BP Readings from Last 3 Encounters:  01/30/13 130/84  01/23/13 154/81  01/23/13 132/65   Wt Readings from Last 3 Encounters:  01/30/13 149 lb (67.586 kg)  01/23/13 150 lb 1.6 oz (68.085 kg)  10/17/12 150 lb (68.04 kg)       Review of Systems  Constitutional: Positive for fatigue. Negative for fever, chills, diaphoresis, activity change, appetite change and unexpected weight change.  HENT: Negative for hearing loss, ear pain, nosebleeds, congestion, sore throat, facial swelling, rhinorrhea, sneezing, mouth sores, trouble swallowing, neck pain, neck stiffness, postnasal drip, sinus pressure and tinnitus.   Eyes: Negative for pain, discharge, redness, itching and visual disturbance.  Respiratory: Negative for cough, chest tightness, shortness of breath, wheezing and stridor.   Cardiovascular: Negative for chest pain, palpitations and leg swelling.  Gastrointestinal: Negative for nausea, diarrhea, constipation, blood in stool, abdominal distention, anal bleeding and rectal pain.  Genitourinary: Negative for dysuria, urgency, frequency, hematuria, flank pain, vaginal bleeding, vaginal discharge, difficulty urinating, genital sores and pelvic pain.  Musculoskeletal: Negative for back pain, joint swelling, arthralgias and gait problem.  Skin: Negative.  Negative for rash.  Neurological: Negative for dizziness, tremors, seizures, syncope, speech difficulty, weakness, numbness and headaches.  Hematological: Negative for adenopathy. Does not bruise/bleed easily.  Psychiatric/Behavioral: Negative for suicidal ideas, behavioral problems, sleep disturbance, dysphoric mood and decreased concentration. The patient is nervous/anxious.         Objective:   Physical Exam  Constitutional: She appears well-developed. No distress.  obese  HENT:  Head: Normocephalic.  Right Ear: External ear normal.  Left Ear: External ear normal.  Nose: Nose normal.  Mouth/Throat: Oropharynx is clear and moist.  No halitosis  Eyes: Conjunctivae are normal. Pupils are equal, round, and reactive to light. Right eye exhibits no discharge. Left eye exhibits no discharge.  Neck: Normal range of motion. Neck supple. No JVD present. No tracheal deviation present. No thyromegaly present.  Cardiovascular: Normal rate, regular rhythm and normal heart sounds.   Pulmonary/Chest: No stridor. No respiratory distress. She has no wheezes.  Abdominal: Soft. Bowel sounds are normal. She exhibits no distension and no mass. There is no tenderness. There is no rebound and no guarding.  Musculoskeletal: She exhibits no edema and no tenderness.  Lymphadenopathy:    She has no cervical adenopathy.  Neurological: She displays normal reflexes. No cranial nerve deficit. She exhibits normal muscle tone. Coordination normal.  Skin: No rash noted. No erythema.  Psychiatric: She has a normal mood and affect. Her behavior is normal. Judgment and thought content normal.   Lab Results  Component Value Date   WBC 6.4 01/29/2013   HGB 14.0 01/29/2013   HCT 41.1 01/29/2013   PLT 142.0* 01/29/2013   GLUCOSE 88 01/29/2013   CHOL 159 01/29/2013   TRIG 69.0 01/29/2013   HDL 59.10 01/29/2013   LDLCALC 86 01/29/2013   ALT 21 01/29/2013   AST 22 01/29/2013   NA 137 01/29/2013   K 4.0 01/29/2013   CL 101 01/29/2013   CREATININE 0.8 01/29/2013   BUN 14 01/29/2013   CO2 31 01/29/2013   TSH 3.21 01/29/2013   INR 1.0 10/07/2008   HGBA1C  Value: 5.9 (NOTE)  The ADA recommends the following therapeutic goal for glycemic   control related to Hgb A1C measurement:   Goal of Therapy:   < 7.0% Hgb A1C   Reference: American Diabetes Association: Clinical Practice   Recommendations 2008, Diabetes Care,  2008,  31:(Suppl 1). 10/07/2008   MICROALBUR 0.4 07/14/2009          Assessment & Plan:

## 2013-01-31 ENCOUNTER — Other Ambulatory Visit: Payer: Self-pay | Admitting: Internal Medicine

## 2013-02-09 ENCOUNTER — Other Ambulatory Visit: Payer: Self-pay | Admitting: Internal Medicine

## 2013-02-20 ENCOUNTER — Telehealth: Payer: Self-pay | Admitting: Oncology

## 2013-02-20 ENCOUNTER — Telehealth: Payer: Self-pay | Admitting: *Deleted

## 2013-02-20 ENCOUNTER — Ambulatory Visit (HOSPITAL_BASED_OUTPATIENT_CLINIC_OR_DEPARTMENT_OTHER): Payer: Medicare Other

## 2013-02-20 VITALS — BP 118/65 | HR 65 | Temp 98.9°F | Resp 18

## 2013-02-20 DIAGNOSIS — D801 Nonfamilial hypogammaglobulinemia: Secondary | ICD-10-CM

## 2013-02-20 DIAGNOSIS — C9110 Chronic lymphocytic leukemia of B-cell type not having achieved remission: Secondary | ICD-10-CM

## 2013-02-20 MED ORDER — IMMUNE GLOBULIN (HUMAN) 10 GM/100ML IV SOLN: 30.0000 g | Freq: Once | INTRAVENOUS | Status: DC

## 2013-02-20 MED ORDER — IMMUNE GLOBULIN (HUMAN) 10 GM/200ML IV SOLN
400.0000 mg/kg | Freq: Once | INTRAVENOUS | Status: AC
  Administered 2013-02-20: 30 g via INTRAVENOUS
  Filled 2013-02-20: qty 600

## 2013-02-20 MED ORDER — DIPHENHYDRAMINE HCL 25 MG PO CAPS
25.0000 mg | ORAL_CAPSULE | Freq: Once | ORAL | Status: AC
  Administered 2013-02-20: 25 mg via ORAL

## 2013-02-20 NOTE — Patient Instructions (Addendum)
IMMUNE GLOBULIN (im MUNE GLOB yoo lin) helps to prevent or reduce the severity of certain infections in patients who are at risk. This medicine is collected from the pooled blood of many donors. It is used to treat immune system problems, thrombocytopenia, and Kawasaki syndrome. This medicine may be used for other purposes; ask your health care provider or pharmacist if you have questions. What should I tell my health care provider before I take this medicine? They need to know if you have any of these conditions: - diabetes - extremely low or no immune antibodies in the blood - heart disease - history of blood clots - hyperprolinemia - infection in the blood, sepsis - kidney disease - taking medicine that may change kidney function - ask your health care provider about your medicine - an unusual or allergic reaction to human immune globulin, albumin, maltose, sucrose, polysorbate 80, other medicines, foods, dyes, or preservatives - pregnant or trying to get pregnant - breast-feeding How should I use this medicine? This medicine is for injection into a muscle or infusion into a vein or skin. It is usually given by a health care professional in a hospital or clinic setting. In rare cases, some brands of this medicine might be given at home. You will be taught how to give this medicine. Use exactly as directed. Take your medicine at regular intervals. Do not take your medicine more often than directed. Talk to your pediatrician regarding the use of this medicine in children. Special care may be needed. Overdosage: If you think you have taken too much of this medicine contact a poison control center or emergency room at once. NOTE: This medicine is only for you. Do not share this medicine with others. What if I miss a dose? It is important not to miss your dose. Call your doctor or health care professional if you are unable to keep an appointment. If you give yourself the medicine and  you miss a dose, take it as soon as you can. If it is almost time for your next dose, take only that dose. Do not take double or extra doses. What may interact with this medicine? -aspirin and aspirin-like medicines -cisplatin -cyclosporine -medicines for infection like acyclovir, adefovir, amphotericin B, bacitracin, cidofovir, foscarnet, ganciclovir, gentamicin, pentamidine, vancomycin -NSAIDS, medicines for pain and inflammation, like ibuprofen or naproxen -pamidronate -vaccines -zoledronic acid This list may not describe all possible interactions. Give your health care provider a list of all the medicines, herbs, non-prescription drugs, or dietary supplements you use. Also tell them if you smoke, drink alcohol, or use illegal drugs. Some items may interact with your medicine. What should I watch for while using this medicine? Your condition will be monitored carefully while you are receiving this medicine. This medicine is made from pooled blood donations of many different people. It may be possible to pass an infection in this medicine. However, the donors are screened for infections and all products are tested for HIV and hepatitis. The medicine is treated to kill most or all bacteria and viruses. Talk to your doctor about the risks and benefits of this medicine. Do not have vaccinations for at least 14 days before, or until at least 3 months after receiving this medicine. What side effects may I notice from receiving this medicine? Side effects that you should report to your doctor or health care professional as soon as possible: -allergic reactions like skin rash, itching or hives, swelling of the face, lips, or tongue -breathing problems -  chest pain or tightness -fever, chills -headache with nausea, vomiting -neck pain or difficulty moving neck -pain when moving eyes -pain, swelling, warmth in the leg -problems with balance, talking, walking -sudden weight gain -swelling of the  ankles, feet, hands -trouble passing urine or change in the amount of urine Side effects that usually do not require medical attention (report to your doctor or health care professional if they continue or are bothersome): -dizzy, drowsy -flushing -increased sweating -leg cramps -muscle aches and pains -pain at site where injected This list may not describe all possible side effects. Call your doctor for medical advice about side effects. You may report side effects to FDA at 1-800-FDA-1088. Where should I keep my medicine? Keep out of the reach of children. This drug is usually given in a hospital or clinic and will not be stored at home. In rare cases, some brands of this medicine may be given at home. If you are using this medicine at home, you will be instructed on how to store this medicine. Throw away any unused medicine after the expiration date on the label. NOTE: This sheet is a summary. It may not cover all possible information. If you have questions about this medicine, talk to your doctor, pharmacist, or health care provider.  2012, [employer]. (09/30/2008 11:44:49 AM) 

## 2013-02-20 NOTE — Telephone Encounter (Signed)
pt came in to r/s aug and sept appt...MW changed per pt reaquest....done

## 2013-02-20 NOTE — Telephone Encounter (Signed)
Pr scheduler I have moved Aug and Sept appts from Thursday to Wednesday

## 2013-03-02 ENCOUNTER — Other Ambulatory Visit: Payer: Self-pay | Admitting: Internal Medicine

## 2013-03-06 ENCOUNTER — Other Ambulatory Visit: Payer: Self-pay | Admitting: Internal Medicine

## 2013-03-19 ENCOUNTER — Ambulatory Visit (HOSPITAL_BASED_OUTPATIENT_CLINIC_OR_DEPARTMENT_OTHER): Payer: Medicare Other

## 2013-03-19 VITALS — BP 116/57 | HR 60 | Temp 97.3°F | Resp 16

## 2013-03-19 DIAGNOSIS — C9110 Chronic lymphocytic leukemia of B-cell type not having achieved remission: Secondary | ICD-10-CM

## 2013-03-19 DIAGNOSIS — D801 Nonfamilial hypogammaglobulinemia: Secondary | ICD-10-CM

## 2013-03-19 MED ORDER — DIPHENHYDRAMINE HCL 25 MG PO CAPS
25.0000 mg | ORAL_CAPSULE | Freq: Once | ORAL | Status: AC
  Administered 2013-03-19: 25 mg via ORAL

## 2013-03-19 MED ORDER — ALTEPLASE 2 MG IJ SOLR
2.0000 mg | Freq: Once | INTRAMUSCULAR | Status: DC | PRN
  Filled 2013-03-19: qty 2

## 2013-03-19 MED ORDER — IMMUNE GLOBULIN (HUMAN) 10 GM/100ML IV SOLN
30.0000 g | Freq: Once | INTRAVENOUS | Status: AC
  Administered 2013-03-19: 30 g via INTRAVENOUS
  Filled 2013-03-19: qty 300

## 2013-03-19 MED ORDER — HEPARIN SOD (PORK) LOCK FLUSH 100 UNIT/ML IV SOLN
250.0000 [IU] | Freq: Once | INTRAVENOUS | Status: DC | PRN
  Filled 2013-03-19: qty 5

## 2013-03-19 MED ORDER — SODIUM CHLORIDE 0.9 % IV SOLN
Freq: Once | INTRAVENOUS | Status: AC
  Administered 2013-03-19: 10:00:00 via INTRAVENOUS

## 2013-03-19 MED ORDER — SODIUM CHLORIDE 0.9 % IJ SOLN
3.0000 mL | Freq: Once | INTRAMUSCULAR | Status: DC | PRN
  Filled 2013-03-19: qty 10

## 2013-03-19 NOTE — Patient Instructions (Addendum)

## 2013-03-20 ENCOUNTER — Ambulatory Visit: Payer: Medicare Other

## 2013-04-11 ENCOUNTER — Other Ambulatory Visit: Payer: Self-pay | Admitting: Internal Medicine

## 2013-04-14 ENCOUNTER — Other Ambulatory Visit: Payer: Self-pay | Admitting: Internal Medicine

## 2013-04-16 ENCOUNTER — Ambulatory Visit (HOSPITAL_BASED_OUTPATIENT_CLINIC_OR_DEPARTMENT_OTHER): Payer: Medicare Other

## 2013-04-16 VITALS — BP 128/75 | HR 64 | Temp 97.6°F | Resp 20 | Ht 64.0 in

## 2013-04-16 DIAGNOSIS — D801 Nonfamilial hypogammaglobulinemia: Secondary | ICD-10-CM

## 2013-04-16 DIAGNOSIS — C9110 Chronic lymphocytic leukemia of B-cell type not having achieved remission: Secondary | ICD-10-CM

## 2013-04-16 MED ORDER — DIPHENHYDRAMINE HCL 25 MG PO CAPS
25.0000 mg | ORAL_CAPSULE | Freq: Once | ORAL | Status: AC
  Administered 2013-04-16: 25 mg via ORAL

## 2013-04-16 MED ORDER — DIPHENHYDRAMINE HCL 25 MG PO CAPS
ORAL_CAPSULE | ORAL | Status: AC
  Filled 2013-04-16: qty 1

## 2013-04-16 MED ORDER — IMMUNE GLOBULIN (HUMAN) 10 GM/100ML IV SOLN: 30.0000 g | Freq: Once | INTRAVENOUS | Status: DC

## 2013-04-16 MED ORDER — IMMUNE GLOBULIN (HUMAN) 10 GM/200ML IV SOLN
400.0000 mg/kg | Freq: Once | INTRAVENOUS | Status: AC
  Administered 2013-04-16: 30 g via INTRAVENOUS
  Filled 2013-04-16: qty 600

## 2013-04-16 NOTE — Patient Instructions (Addendum)

## 2013-04-17 ENCOUNTER — Ambulatory Visit: Payer: Medicare Other

## 2013-05-01 ENCOUNTER — Ambulatory Visit (INDEPENDENT_AMBULATORY_CARE_PROVIDER_SITE_OTHER): Payer: Medicare Other | Admitting: Internal Medicine

## 2013-05-01 ENCOUNTER — Encounter: Payer: Self-pay | Admitting: Internal Medicine

## 2013-05-01 VITALS — BP 142/82 | HR 68 | Temp 97.1°F | Resp 16 | Wt 150.0 lb

## 2013-05-01 DIAGNOSIS — E538 Deficiency of other specified B group vitamins: Secondary | ICD-10-CM

## 2013-05-01 DIAGNOSIS — E785 Hyperlipidemia, unspecified: Secondary | ICD-10-CM

## 2013-05-01 DIAGNOSIS — Z23 Encounter for immunization: Secondary | ICD-10-CM

## 2013-05-01 DIAGNOSIS — I10 Essential (primary) hypertension: Secondary | ICD-10-CM

## 2013-05-01 DIAGNOSIS — R51 Headache: Secondary | ICD-10-CM

## 2013-05-01 NOTE — Progress Notes (Signed)
Patient ID: [name], female   DOB: 08-30-1925, 79 y.o.   MRN: [id number]   Subjective:    HPI   The patient presents for a follow-up of  chronic hypertension, chronic dyslipidemia, anxiety (mild) controlled with medicines; halitosis. BP is good home. Lost some wt. F/u Vit B12 def - Nascobal was $$$  Her 2nd son had a CVA in 2.13  BP Readings from Last 3 Encounters:  05/01/13 142/82  04/16/13 128/75  03/19/13 116/57   Wt Readings from Last 3 Encounters:  05/01/13 150 lb (68.04 kg)  01/30/13 149 lb (67.586 kg)  01/23/13 150 lb 1.6 oz (68.085 kg)       Review of Systems  Constitutional: Positive for fatigue. Negative for fever, chills, diaphoresis, activity change, appetite change and unexpected weight change.  HENT: Negative for congestion, ear pain, facial swelling, hearing loss, mouth sores, nosebleeds, postnasal drip, rhinorrhea, sinus pressure, sneezing, sore throat, tinnitus and trouble swallowing.   Eyes: Negative for pain, discharge, redness, itching and visual disturbance.  Respiratory: Negative for cough, chest tightness, shortness of breath, wheezing and stridor.   Cardiovascular: Negative for chest pain, palpitations and leg swelling.  Gastrointestinal: Negative for nausea, diarrhea, constipation, blood in stool, abdominal distention, anal bleeding and rectal pain.  Genitourinary: Negative for dysuria, urgency, frequency, hematuria, flank pain, vaginal bleeding, vaginal discharge, difficulty urinating, genital sores and pelvic pain.  Musculoskeletal: Negative for arthralgias, back pain, gait problem, joint swelling, neck pain and neck stiffness.  Skin: Negative.  Negative for rash.  Neurological: Negative for dizziness, tremors, seizures, syncope, speech difficulty, weakness, numbness and headaches.  Hematological: Negative for adenopathy. Does not bruise/bleed easily.  Psychiatric/Behavioral: Negative for suicidal ideas, behavioral problems, sleep disturbance,  dysphoric mood and decreased concentration. The patient is nervous/anxious.        Objective:   Physical Exam  Constitutional: She appears well-developed. No distress.  obese  HENT:  Head: Normocephalic.  Right Ear: External ear normal.  Left Ear: External ear normal.  Nose: Nose normal.  Mouth/Throat: Oropharynx is clear and moist.  No halitosis  Eyes: Conjunctivae are normal. Pupils are equal, round, and reactive to light. Right eye exhibits no discharge. Left eye exhibits no discharge.  Neck: Normal range of motion. Neck supple. No JVD present. No tracheal deviation present. No thyromegaly present.  Cardiovascular: Normal rate, regular rhythm and normal heart sounds.   Pulmonary/Chest: No stridor. No respiratory distress. She has no wheezes.  Abdominal: Soft. Bowel sounds are normal. She exhibits no distension and no mass. There is no tenderness. There is no rebound and no guarding.  Musculoskeletal: She exhibits no edema and no tenderness.  Lymphadenopathy:    She has no cervical adenopathy.  Neurological: She displays normal reflexes. No cranial nerve deficit. She exhibits normal muscle tone. Coordination normal.  Skin: No rash noted. No erythema.  Psychiatric: She has a normal mood and affect. Her behavior is normal. Judgment and thought content normal.   Lab Results  Component Value Date   WBC 6.4 01/29/2013   HGB 14.0 01/29/2013   HCT 41.1 01/29/2013   PLT 142.0* 01/29/2013   GLUCOSE 88 01/29/2013   CHOL 159 01/29/2013   TRIG 69.0 01/29/2013   HDL 59.10 01/29/2013   LDLCALC 86 01/29/2013   ALT 21 01/29/2013   AST 22 01/29/2013   NA 137 01/29/2013   K 4.0 01/29/2013   CL 101 01/29/2013   CREATININE 0.8 01/29/2013   BUN 14 01/29/2013   CO2 31 01/29/2013  TSH 3.21 01/29/2013   INR 1.0 10/07/2008   HGBA1C  Value: 5.9 (NOTE)   The ADA recommends the following therapeutic goal for glycemic   control related to Hgb A1C measurement:   Goal of Therapy:   < 7.0% Hgb A1C   Reference: American Diabetes  Association: Clinical Practice   Recommendations 2008, Diabetes Care,  2008, 31:(Suppl 1). 10/07/2008   MICROALBUR 0.4 07/14/2009          Assessment & Plan:

## 2013-05-01 NOTE — Assessment & Plan Note (Signed)
Continue with current prescription therapy as reflected on the Med list.  

## 2013-05-06 ENCOUNTER — Other Ambulatory Visit: Payer: Self-pay | Admitting: Internal Medicine

## 2013-05-15 ENCOUNTER — Encounter (INDEPENDENT_AMBULATORY_CARE_PROVIDER_SITE_OTHER): Payer: Self-pay

## 2013-05-15 ENCOUNTER — Ambulatory Visit (HOSPITAL_BASED_OUTPATIENT_CLINIC_OR_DEPARTMENT_OTHER): Payer: Medicare Other

## 2013-05-15 ENCOUNTER — Other Ambulatory Visit: Payer: Self-pay | Admitting: Internal Medicine

## 2013-05-15 VITALS — BP 118/66 | HR 67 | Temp 98.6°F | Resp 17

## 2013-05-15 DIAGNOSIS — D801 Nonfamilial hypogammaglobulinemia: Secondary | ICD-10-CM

## 2013-05-15 DIAGNOSIS — C9110 Chronic lymphocytic leukemia of B-cell type not having achieved remission: Secondary | ICD-10-CM

## 2013-05-15 MED ORDER — IMMUNE GLOBULIN (HUMAN) 10 GM/100ML IV SOLN: 30.0000 g | Freq: Once | INTRAVENOUS | Status: DC

## 2013-05-15 MED ORDER — SODIUM CHLORIDE 0.9 % IV SOLN
INTRAVENOUS | Status: DC
  Administered 2013-05-15: 10:00:00 via INTRAVENOUS

## 2013-05-15 MED ORDER — DIPHENHYDRAMINE HCL 25 MG PO CAPS
ORAL_CAPSULE | ORAL | Status: AC
Start: 2013-05-15 — End: 2013-05-15
  Filled 2013-05-15: qty 1

## 2013-05-15 MED ORDER — IMMUNE GLOBULIN (HUMAN) 10 GM/200ML IV SOLN
30.0000 g | Freq: Once | INTRAVENOUS | Status: AC
  Administered 2013-05-15: 30 g via INTRAVENOUS
  Filled 2013-05-15: qty 600

## 2013-05-15 MED ORDER — DIPHENHYDRAMINE HCL 25 MG PO CAPS
25.0000 mg | ORAL_CAPSULE | Freq: Once | ORAL | Status: AC
  Administered 2013-05-15: 25 mg via ORAL

## 2013-05-15 NOTE — Patient Instructions (Signed)
Immune Globulin Injection What is this medicine? IMMUNE GLOBULIN (im MUNE GLOB yoo lin) helps to prevent or reduce the severity of certain infections in patients who are at risk. This medicine is collected from the pooled blood of many donors. It is used to treat immune system problems, thrombocytopenia, and Kawasaki syndrome. This medicine may be used for other purposes; ask your health care provider or pharmacist if you have questions.  taking medicine that may change kidney function - ask your health care provider about your medicine  an unusual or allergic reaction to human immune globulin, albumin, maltose, sucrose, polysorbate 80, other medicines, foods, dyes, or preservatives  How should I use this medicine? This medicine is for injection into a muscle or infusion into a vein or skin. It is usually given by a health care professional in a hospital or clinic setting. In rare cases, some brands of this medicine might be given at home. You will be taught how to give this medicine. Use exactly as directed. Take your medicine at regular intervals. Do not take your medicine more often than directed. Talk to your pediatrician regarding the use of this medicine in children. Special care may be needed. Overdosage: If you think you have taken too much of this medicine contact a poison control center or emergency room at once. NOTE: This medicine is only for you. Do not share this medicine with others. What if I miss a dose? It is important not to miss your dose. Call your doctor or health care professional if you are unable to keep an appointment. If you give yourself the medicine and you miss a dose, take it as soon as you can. If it is almost time for your next dose, take only that dose. Do not take double or extra doses. What may interact with this medicine? -aspirin and aspirin-like medicines -cisplatin -cyclosporine -medicines for infection like acyclovir, adefovir, amphotericin B, bacitracin,  cidofovir, foscarnet, ganciclovir, gentamicin, pentamidine, vancomycin -NSAIDS, medicines for pain and inflammation, like ibuprofen or naproxen -pamidronate -vaccines -zoledronic acid This list may not describe all possible interactions. Give your health care provider a list of all the medicines, herbs, non-prescription drugs, or dietary supplements you use. Also tell them if you smoke, drink alcohol, or use illegal drugs. Some items may interact with your medicine. What should I watch for while using this medicine? Your condition will be monitored carefully while you are receiving this medicine. This medicine is made from pooled blood donations of many different people. It may be possible to pass an infection in this medicine. However, the donors are screened for infections and all products are tested for HIV and hepatitis. The medicine is treated to kill most or all bacteria and viruses. Talk to your doctor about the risks and benefits of this medicine. Do not have vaccinations for at least 14 days before, or until at least 3 months after receiving this medicine. What side effects may I notice from receiving this medicine? Side effects that you should report to your doctor or health care professional as soon as possible: -allergic reactions like skin rash, itching or hives, swelling of the face, lips, or tongue -breathing problems -chest pain or tightness -fever, chills -headache with nausea, vomiting -neck pain or difficulty moving neck -pain when moving eyes -pain, swelling, warmth in the leg -problems with balance, talking, walking -sudden weight gain -swelling of the ankles, feet, hands -trouble passing urine or change in the amount of urine Side effects that usually do not  require medical attention (report to your doctor or health care professional if they continue or are bothersome): -dizzy, drowsy -flushing -increased sweating -leg cramps -muscle aches and pains -pain at site  where injected This list may not describe all possible side effects. Call your doctor for medical advice about side effects. You may report side effects to FDA at 1-800-FDA-1088. Where should I keep my medicine? This drug is usually given in a hospital or clinic and will not be stored at home. In rare cases, some brands of this medicine may be given at home. If you are using this medicine at home, you will be instructed on how to store this medicine. Throw away any unused medicine after the expiration date on the label. NOTE: This sheet is a summary. It may not cover all possible information. If you have questions about this medicine, talk to your doctor, pharmacist, or health care provider.  2013, Elsevier/Gold Standard. (09/30/2008 11:44:49 AM)

## 2013-05-16 ENCOUNTER — Other Ambulatory Visit: Payer: Self-pay | Admitting: Internal Medicine

## 2013-05-19 ENCOUNTER — Other Ambulatory Visit: Payer: Self-pay | Admitting: Internal Medicine

## 2013-05-29 ENCOUNTER — Other Ambulatory Visit: Payer: Self-pay | Admitting: Internal Medicine

## 2013-06-01 ENCOUNTER — Other Ambulatory Visit: Payer: Self-pay | Admitting: Internal Medicine

## 2013-06-09 ENCOUNTER — Other Ambulatory Visit: Payer: Self-pay | Admitting: Internal Medicine

## 2013-06-12 ENCOUNTER — Ambulatory Visit (HOSPITAL_BASED_OUTPATIENT_CLINIC_OR_DEPARTMENT_OTHER): Payer: Medicare Other

## 2013-06-12 VITALS — BP 130/78 | HR 71 | Temp 97.7°F | Resp 19 | Ht 64.0 in

## 2013-06-12 DIAGNOSIS — D801 Nonfamilial hypogammaglobulinemia: Secondary | ICD-10-CM

## 2013-06-12 DIAGNOSIS — C9110 Chronic lymphocytic leukemia of B-cell type not having achieved remission: Secondary | ICD-10-CM

## 2013-06-12 MED ORDER — IMMUNE GLOBULIN (HUMAN) 10 GM/200ML IV SOLN
400.0000 mg/kg | Freq: Once | INTRAVENOUS | Status: AC
  Administered 2013-06-12: 30 g via INTRAVENOUS
  Filled 2013-06-12: qty 600

## 2013-06-12 MED ORDER — DIPHENHYDRAMINE HCL 25 MG PO CAPS
ORAL_CAPSULE | ORAL | Status: AC
  Filled 2013-06-12: qty 1

## 2013-06-12 MED ORDER — SODIUM CHLORIDE 0.9 % IV SOLN
Freq: Once | INTRAVENOUS | Status: AC
  Administered 2013-06-12: 10:00:00 via INTRAVENOUS

## 2013-06-12 MED ORDER — IMMUNE GLOBULIN (HUMAN) 10 GM/100ML IV SOLN
30.0000 g | Freq: Once | INTRAVENOUS | Status: DC
  Filled 2013-06-12: qty 300

## 2013-06-12 MED ORDER — DIPHENHYDRAMINE HCL 25 MG PO CAPS
25.0000 mg | ORAL_CAPSULE | Freq: Once | ORAL | Status: AC
  Administered 2013-06-12: 25 mg via ORAL

## 2013-06-12 NOTE — Patient Instructions (Signed)

## 2013-06-12 NOTE — Progress Notes (Signed)
IVIG given today without any complications. Started at rate of 42mL/hr for 20.45mL. Titrated to 52mL/hr for 40mL, then to 126mL/hr for for 81mL, and lastly 265mL/hr for . VS stable at this time. Infusion maintained at 215mL/hr (max rate) for the rest of the infusion. VS taken at the end of infusion and charted. Patient observed for and then discharged home. Angelena Form, RN

## 2013-06-29 ENCOUNTER — Encounter (HOSPITAL_COMMUNITY): Payer: Self-pay | Admitting: Emergency Medicine

## 2013-06-29 ENCOUNTER — Emergency Department (HOSPITAL_COMMUNITY)
Admission: EM | Admit: 2013-06-29 | Discharge: 2013-06-29 | Disposition: A | Discharge status: Home / Self Care | Payer: Medicare Other | Attending: Emergency Medicine | Admitting: Emergency Medicine

## 2013-06-29 ENCOUNTER — Emergency Department (HOSPITAL_COMMUNITY): Payer: Medicare Other

## 2013-06-29 DIAGNOSIS — S0990XA Unspecified injury of head, initial encounter: Secondary | ICD-10-CM | POA: Insufficient documentation

## 2013-06-29 DIAGNOSIS — W19XXXA Unspecified fall, initial encounter: Secondary | ICD-10-CM

## 2013-06-29 DIAGNOSIS — S42291A Other displaced fracture of upper end of right humerus, initial encounter for closed fracture: Secondary | ICD-10-CM

## 2013-06-29 DIAGNOSIS — Y9229 Other specified public building as the place of occurrence of the external cause: Secondary | ICD-10-CM | POA: Insufficient documentation

## 2013-06-29 DIAGNOSIS — E538 Deficiency of other specified B group vitamins: Secondary | ICD-10-CM | POA: Insufficient documentation

## 2013-06-29 DIAGNOSIS — Z860100 Personal history of colon polyps, unspecified: Secondary | ICD-10-CM | POA: Insufficient documentation

## 2013-06-29 DIAGNOSIS — Z79899 Other long term (current) drug therapy: Secondary | ICD-10-CM | POA: Insufficient documentation

## 2013-06-29 DIAGNOSIS — D801 Nonfamilial hypogammaglobulinemia: Secondary | ICD-10-CM | POA: Insufficient documentation

## 2013-06-29 DIAGNOSIS — Z9109 Other allergy status, other than to drugs and biological substances: Secondary | ICD-10-CM | POA: Insufficient documentation

## 2013-06-29 DIAGNOSIS — E785 Hyperlipidemia, unspecified: Secondary | ICD-10-CM | POA: Insufficient documentation

## 2013-06-29 DIAGNOSIS — H409 Unspecified glaucoma: Secondary | ICD-10-CM | POA: Insufficient documentation

## 2013-06-29 DIAGNOSIS — Z888 Allergy status to other drugs, medicaments and biological substances status: Secondary | ICD-10-CM | POA: Insufficient documentation

## 2013-06-29 DIAGNOSIS — W108XXA Fall (on) (from) other stairs and steps, initial encounter: Secondary | ICD-10-CM | POA: Insufficient documentation

## 2013-06-29 DIAGNOSIS — Z8709 Personal history of other diseases of the respiratory system: Secondary | ICD-10-CM | POA: Insufficient documentation

## 2013-06-29 DIAGNOSIS — Z856 Personal history of leukemia: Secondary | ICD-10-CM | POA: Insufficient documentation

## 2013-06-29 DIAGNOSIS — D510 Vitamin B12 deficiency anemia due to intrinsic factor deficiency: Secondary | ICD-10-CM | POA: Insufficient documentation

## 2013-06-29 DIAGNOSIS — G609 Hereditary and idiopathic neuropathy, unspecified: Secondary | ICD-10-CM | POA: Insufficient documentation

## 2013-06-29 DIAGNOSIS — M1990 Unspecified osteoarthritis, unspecified site: Secondary | ICD-10-CM | POA: Insufficient documentation

## 2013-06-29 DIAGNOSIS — Z885 Allergy status to narcotic agent status: Secondary | ICD-10-CM | POA: Insufficient documentation

## 2013-06-29 DIAGNOSIS — Z87440 Personal history of urinary (tract) infections: Secondary | ICD-10-CM | POA: Insufficient documentation

## 2013-06-29 DIAGNOSIS — Y9301 Activity, walking, marching and hiking: Secondary | ICD-10-CM | POA: Insufficient documentation

## 2013-06-29 DIAGNOSIS — S8000XA Contusion of unspecified knee, initial encounter: Secondary | ICD-10-CM | POA: Insufficient documentation

## 2013-06-29 DIAGNOSIS — I10 Essential (primary) hypertension: Secondary | ICD-10-CM | POA: Insufficient documentation

## 2013-06-29 DIAGNOSIS — Z8719 Personal history of other diseases of the digestive system: Secondary | ICD-10-CM | POA: Insufficient documentation

## 2013-06-29 DIAGNOSIS — Z7982 Long term (current) use of aspirin: Secondary | ICD-10-CM | POA: Insufficient documentation

## 2013-06-29 DIAGNOSIS — S42293A Other displaced fracture of upper end of unspecified humerus, initial encounter for closed fracture: Secondary | ICD-10-CM | POA: Insufficient documentation

## 2013-06-29 DIAGNOSIS — S42211A Unspecified displaced fracture of surgical neck of right humerus, initial encounter for closed fracture: Secondary | ICD-10-CM

## 2013-06-29 DIAGNOSIS — S42213A Unspecified displaced fracture of surgical neck of unspecified humerus, initial encounter for closed fracture: Secondary | ICD-10-CM | POA: Insufficient documentation

## 2013-06-29 MED ORDER — HYDROCODONE-ACETAMINOPHEN 5-325 MG PO TABS: 1.0000 | ORAL_TABLET | ORAL | Status: DC | PRN

## 2013-06-29 MED ORDER — HYDROCODONE-ACETAMINOPHEN 5-325 MG PO TABS
1.0000 | ORAL_TABLET | Freq: Once | ORAL | Status: AC
  Administered 2013-06-29: 1 via ORAL
  Filled 2013-06-29: qty 1

## 2013-06-29 NOTE — ED Provider Notes (Signed)
CSN: [id number]     Arrival date & time 06/29/13  1729 History   First MD Initiated Contact with Patient 06/29/13 1753     Chief Complaint  Patient presents with  . Fall   (Consider location/radiation/quality/duration/timing/severity/associated sxs/prior Treatment) HPI Pt reports she was walking [place] the steps at [place] and tripped on a step and fell forward.  Reports severe pain in her right shoulder, which she hit on a post.  She fell to her knees on a marble floor and then fell forward, striking her right shoulder and then finally her forehead on the floor.  Denies any pain in her head, neck, back, or anywhere besides her right shoulder and mild pain in her bilateral knees.     Past Medical History  Diagnosis Date  . Hyperlipidemia   . Hypertension   . Allergy     rhinitis  . Hx of colonic polyps   . Diverticulosis of colon   . Osteoarthritis   . Pernicious anemia   . Glaucoma   . Peripheral neuropathy   . Vitamin B12 deficiency   . Chronic ethmoidal sinusitis   . UTI (lower urinary tract infection) 2010  . Leukemia     Chronic lymphocytic leukemia  . Hypogammaglobulinemia     Monthly IVIG   Past Surgical History  Procedure Laterality Date  . Bilateral cataracts  2007  . Abdominal hysterectomy  1987  . Bladder tack  1987  . Ureter reconstruction  1992  . L4-l5 laminetomy  1999  . Tubaligation  1961   Family History  Problem Relation Age of Onset  . Cancer Mother     uterine  . Jaundice Father   . Other Sister     brain tumor  . Stroke Son 46    in [place]  . Hypertension Other   . Cancer Other     breast   History  Substance Use Topics  . Smoking status: Never Smoker   . Smokeless tobacco: Not on file  . Alcohol Use: No   OB History   Grav Para Term Preterm Abortions TAB SAB Ect Mult Living                 Review of Systems  Cardiovascular: Negative for chest pain.  Gastrointestinal: Negative for abdominal pain.  Musculoskeletal: Negative for back pain  and neck pain.  Skin: Negative for color change and wound.  Neurological: Negative for dizziness, syncope, weakness, light-headedness and numbness.  Psychiatric/Behavioral: Negative for confusion.    Allergies  Pneumococcal vaccines; Clarithromycin; Oxycodone-aspirin; and Propoxyphene-acetaminophen  Home Medications   Current Outpatient Rx  Name  Route  Sig  Dispense  Refill  . acetaminophen (TYLENOL) 500 MG tablet   Oral   Take 500 mg by mouth 3 (three) times daily.           Marland Kitchen amLODipine (NORVASC) 5 MG tablet      TAKE 1 TABLET (5 MG TOTAL) BY MOUTH DAILY.   30 tablet   5   . amLODipine (NORVASC) 5 MG tablet      TAKE 1 TABLET (5 MG TOTAL) BY MOUTH DAILY.   30 tablet   5   . aspirin 81 MG EC tablet   Oral   Take 81 mg by mouth daily.           . B-D INS SYR ULTRAFINE 1CC/30G 30G X 1/2" 1 ML MISC      USE TO ADMINISTER B12 INJECTIONS   10 each  1   . B-D SYRINGE LUER-LOK 3CC 3 ML MISC      USE TO ADMINISTER B12 INJECTIONS   10 each   2   . Cholecalciferol (EQL VITAMIN D3) 1000 UNITS tablet   Oral   Take 1,000 Units by mouth daily.           . cyanocobalamin (,VITAMIN B-12,) 1000 MCG/ML injection      ADMINISTER 1 CC SQ EVERY 4 WEEKS   10 mL   0   . gabapentin (NEURONTIN) 300 MG capsule      TAKE ONE CAPSULE BY MOUTH EVERY DAY   90 capsule   2   . Ginseng 250 MG CAPS   Oral   Take 1 capsule by mouth 2 (two) times daily.           . Grape Seed Extract 100 MG CAPS   Oral   Take 1 capsule by mouth 2 (two) times daily.           . hydrochlorothiazide (MICROZIDE) 12.5 MG capsule      TAKE ONE CAPSULE BY MOUTH EVERY DAY FOR BLOOD PRESSURE   30 capsule   5   . INSULIN SYRINGE .5CC/29G (ACCUSURE INS SYR .5CC/29GX1/2") 29G X 1/2" 0.5 ML MISC   Does not apply   by Does not apply route as directed. For Vit B12 shots          . Insulin Syringe-Needle U-100 (B-D INS SYRINGE 0.5CC/30GX1/2") 30G X 1/2" 0.5 ML MISC   Does not apply   by  Does not apply route. To administer B12 injections          . KLOR-CON M10 10 MEQ tablet      TAKE 1 TABLET EVERY DAY   30 tablet   5   . Lactobacillus (ACIDOPHILUS PO)   Oral   Take by mouth daily.         Marland Kitchen latanoprost (XALATAN) 0.005 % ophthalmic solution               . loratadine (CLARITIN) 10 MG tablet      TAKE 1 TABLET BY MOUTH EVERY DAY   30 tablet   5   . omeprazole (PRILOSEC) 40 MG capsule      TAKE 1 CAPSULE (40 MG TOTAL) BY MOUTH DAILY.   90 capsule   3   . omeprazole (PRILOSEC) 40 MG capsule      TAKE 1 CAPSULE (40 MG TOTAL) BY MOUTH DAILY.   90 capsule   2   . omeprazole (PRILOSEC) 40 MG capsule      TAKE 1 CAPSULE (40 MG TOTAL) BY MOUTH DAILY.   90 capsule   2   . potassium chloride (KLOR-CON M10) 10 MEQ tablet      TAKE 1 TABLET EVERY DAY   30 tablet   11   . Probiotic Product (PROBIOTIC FORMULA PO)   Oral   Take 1 capsule by mouth daily.           . simvastatin (ZOCOR) 20 MG tablet      TAKE 1 TABLET (20 MG TOTAL) BY MOUTH DAILY.   90 tablet   2   . travoprost, benzalkonium, (TRAVATAN) 0.004 % ophthalmic solution      1 drop at bedtime.            BP 115/81  Pulse 77  Temp(Src) 97.9 F (36.6 C) (Oral)  Resp 20  SpO2 96% Physical Exam  Nursing note  and vitals reviewed. Constitutional: She appears well-developed and well-nourished. No distress.  HENT:  Head: Normocephalic and atraumatic.  Eyes: Conjunctivae and EOM are normal.  Neck: Neck supple.  Pulmonary/Chest: Effort normal.  Musculoskeletal:       Legs: Pt holding right arm to her side, flinches with any movement of the right shoulder.  Palpation of elbow causes pain in right shoulder, no discoloration or tenderness over right shoulder with light palpation.  Distal pulses and sensation intact.   Neurological: She is alert. She exhibits normal muscle tone. GCS eye subscore is 4. GCS verbal subscore is 5. GCS motor subscore is 6.  Skin: She is not diaphoretic.     ED Course  Procedures (including critical care time) Labs Review Labs Reviewed - No data to display Imaging Review Dg Shoulder Right  06/29/2013   CLINICAL DATA:  Larey Seat.  Right shoulder pain.  EXAM: RIGHT SHOULDER - 2+ VIEW  COMPARISON:  None.  FINDINGS: There are fractures of the humeral head and neck without significant displacement. The glenohumeral joint is maintained. The [facility] joint is intact.  IMPRESSION: Humeral head and neck fractures.   Electronically Signed   By: Loralie Champagne M.D.   On: 06/29/2013 19:20   Dg Knee Complete 4 Views Left  06/29/2013   CLINICAL DATA:  Larey Seat.  EXAM: LEFT KNEE - COMPLETE 4+ VIEW  COMPARISON:  None.  FINDINGS: Mild degenerative changes and osteoporosis. No acute fracture or osteochondral lesion. No joint effusion.  IMPRESSION: No acute fracture or joint effusion.   Electronically Signed   By: Loralie Champagne M.D.   On: 06/29/2013 19:19   Dg Knee Complete 4 Views Right  06/29/2013   CLINICAL DATA:  Knee pain.  EXAM: RIGHT KNEE - COMPLETE 4+ VIEW  COMPARISON:  None.  FINDINGS: Mild degenerative changes and osteoporosis. No acute fracture or osteochondral abnormality. No joint effusion.  IMPRESSION: No acute fracture or joint effusion.   Electronically Signed   By: Loralie Champagne M.D.   On: 06/29/2013 19:18    EKG Interpretation   None      Discussed pt with Dr Hyacinth Meeker who has also seen the patient.   MDM   1. Humeral head fracture, right, closed, initial encounter   2. Humeral surgical neck fracture, right, closed, initial encounter   3. Fall, initial encounter      Pt p/w right shoulder pain and mild bilateral knee pain after mechanical fall just prior to arrival.  Pt did hit her forehead on the ground at the very end of the fall but denies any pain in her head, denies being on blood thinners (she does take a daily aspirin).  Denies syncope.  She has no skin changes presently over forehead, no hematoma or visible contusion.  Xrays show humeral  head and neck fracture.  Knee xrays negative.  She does have very small bruise over left knee.  Placed in shoulder immobilizer, d/c home with vicodin, NSAIDs/RICE, ortho follow up.  Discussed result, findings, treatment, and follow up  with patient.  Pt given return precautions.  Pt verbalizes understanding and agrees with plan.      Trixie Dredge, PA-C 06/29/13 2000

## 2013-06-29 NOTE — ED Notes (Signed)
Ortho tech paged  

## 2013-06-29 NOTE — ED Provider Notes (Signed)
79 year old female, presents after having a mechanical fall landing on her right arm and left knee. On exam the patient has a swollen and tender right proximal humerus and shoulder, bruising over the left knee but is able to straight leg raise bilaterally. She has decreased range of motion of the right shoulder but no decreased range of motion at the elbow or wrist of the right upper extremity. All other joints are supple, compartments are soft, no signs of any significant head injuries, normal mental status, follows commands speech is. X-rays reveal patient is a proximal humerus fracture, there is no signs of fractures of the knees discharge.  Medical screening examination/treatment/procedure(s) were conducted as a shared visit with non-physician practitioner(s) and myself.  I personally evaluated the patient during the encounter.        Vida Roller, MD 07/01/13 [id number]

## 2013-06-29 NOTE — Progress Notes (Signed)
Orthopedic Tech Progress Note Patient Details:  [name] 11-02-1925 [id number]  Ortho Devices Type of Ortho Device: Sling immobilizer Ortho Device/Splint Location: rue Ortho Device/Splint Interventions: Application   Nikki Dom 06/29/2013, 8:26 PM

## 2013-06-29 NOTE — ED Notes (Signed)
Pt in Xray dept.

## 2013-06-29 NOTE — ED Notes (Signed)
Pt states she was going up steps missed a step and fell onto her knees and hit her R shoulder on a post. C/o severe R shoulder pain since, especially with movement. Peripheral pulses intact, cap refill <2, can wiggle digits. Pt denies any other complaints. She is able to [facility] and is A&Ox4

## 2013-07-01 NOTE — ED Provider Notes (Signed)
  79 year old female, presents after having a mechanical fall landing on her right arm and left knee. On exam the patient has a swollen and tender right proximal humerus and shoulder, bruising over the left knee but is able to straight leg raise bilaterally. She has decreased range of motion of the right shoulder but no decreased range of motion at the elbow or wrist of the right upper extremity. All other joints are supple, compartments are soft, no signs of any significant head injuries, normal mental status, follows commands speech is. X-rays reveal patient is a proximal humerus fracture, there is no signs of fractures of the knees discharge.   Medical screening examination/treatment/procedure(s) were conducted as a shared visit with non-physician practitioner(s) and myself. I personally evaluated the patient during the encounter.     Vida Roller, MD 07/01/13 [phone]

## 2013-07-10 ENCOUNTER — Telehealth: Payer: Self-pay | Admitting: *Deleted

## 2013-07-10 ENCOUNTER — Ambulatory Visit (HOSPITAL_BASED_OUTPATIENT_CLINIC_OR_DEPARTMENT_OTHER): Payer: Medicare Other

## 2013-07-10 VITALS — BP 128/72 | HR 67 | Temp 97.8°F | Resp 16

## 2013-07-10 DIAGNOSIS — S72142S Displaced intertrochanteric fracture of left femur, sequela: Secondary | ICD-10-CM

## 2013-07-10 DIAGNOSIS — C9110 Chronic lymphocytic leukemia of B-cell type not having achieved remission: Secondary | ICD-10-CM

## 2013-07-10 DIAGNOSIS — D801 Nonfamilial hypogammaglobulinemia: Secondary | ICD-10-CM

## 2013-07-10 MED ORDER — DIPHENHYDRAMINE HCL 25 MG PO CAPS
ORAL_CAPSULE | ORAL | Status: AC
  Filled 2013-07-10: qty 1

## 2013-07-10 MED ORDER — DIPHENHYDRAMINE HCL 25 MG PO CAPS
25.0000 mg | ORAL_CAPSULE | Freq: Once | ORAL | Status: AC
  Administered 2013-07-10: 25 mg via ORAL

## 2013-07-10 MED ORDER — IMMUNE GLOBULIN (HUMAN) 20 GM/200ML IV SOLN
0.4500 g/kg | Freq: Once | INTRAVENOUS | Status: AC
  Administered 2013-07-10: 30 g via INTRAVENOUS
  Filled 2013-07-10: qty 300

## 2013-07-10 MED ORDER — IMMUNE GLOBULIN (HUMAN) 10 GM/100ML IV SOLN: 30.0000 g | Freq: Once | INTRAVENOUS | Status: DC

## 2013-07-10 MED ORDER — IMMUNE GLOBULIN (HUMAN) 10 GM/100ML IV SOLN
0.4500 g/kg | Freq: Once | INTRAVENOUS | Status: DC
  Filled 2013-07-10: qty 300

## 2013-07-10 MED ORDER — SODIUM CHLORIDE 0.9 % IV SOLN
Freq: Once | INTRAVENOUS | Status: AC
  Administered 2013-07-10: 10:00:00 via INTRAVENOUS

## 2013-07-10 MED ORDER — IMMUNE GLOBULIN (HUMAN) 20 GM/200ML IV SOLN: 0.4500 g/kg | Freq: Once | INTRAVENOUS | Status: DC

## 2013-07-10 NOTE — Telephone Encounter (Signed)
Pt fell a couple weeks ago. She has fractured humeral head/neck. She is requesting orders for OT to help with her ADLs. She is also requesting a order for a medical alert system and a bath seat. Please advise.

## 2013-07-10 NOTE — Telephone Encounter (Signed)
Sorry about her fall. OK for all 3. Thx

## 2013-07-10 NOTE — Patient Instructions (Signed)

## 2013-07-10 NOTE — Progress Notes (Signed)
Pt in today for IVIG. Pt has rt arm in sling secondary to rt shoulder fracture. Pt states she had a fall on 06/29/13 and was seen in ED and treated for fracture to rt proximal humerus. Pt states since she has been home  it has been difficult for her to complete her ADL's.  She indicates that she lives alone and has no one to help her with ADL's. Her church is providing meals for her at this time.  She rates her pain a 5 on the pain scale of 0-10 but states she is not taking prescibed pain med(Vicodin) for fear of falling. She is presently taking Tylenol for her shoulder pain.  Ignacia Bayley and Dr Myrle Sheng  made aware,  Kathyrn Sheriff to call Dr Posey Rea, pt's PCP, to suggest patient be evaluated for possible Home Healthcare. Pt knows to follow up with Dr. Loren Racer office.

## 2013-07-15 ENCOUNTER — Other Ambulatory Visit: Payer: Self-pay | Admitting: *Deleted

## 2013-07-15 ENCOUNTER — Telehealth: Payer: Self-pay | Admitting: Oncology

## 2013-07-15 ENCOUNTER — Telehealth: Payer: Self-pay | Admitting: *Deleted

## 2013-07-15 DIAGNOSIS — S72142S Displaced intertrochanteric fracture of left femur, sequela: Secondary | ICD-10-CM

## 2013-07-15 MED ORDER — UNABLE TO FIND: Status: DC

## 2013-07-15 NOTE — Telephone Encounter (Signed)
Pt informed  Pt already has bath seat. Pt is going to medical alert company and see if they need anything from [place].

## 2013-07-15 NOTE — Telephone Encounter (Signed)
Pt left vm stating she needs Rx for a commode seat sent to [facility] [address]. Rx sent per pt request.

## 2013-07-15 NOTE — Progress Notes (Signed)
Patient called to report that she can no longer drive herself to appointments due to fractured arm. Her transportation can only bring her on Tuesdays. Needs to reschedule the 08/01/13 visit. POF to scheduler to move her to 08/12/13

## 2013-07-15 NOTE — Telephone Encounter (Signed)
Gave pt appt for lab,Md and IVIG will follow, emailed Marcelino Duster regardinng change of date drom 1/9 to 08/12/13

## 2013-07-16 ENCOUNTER — Telehealth: Payer: Self-pay | Admitting: Oncology

## 2013-07-16 ENCOUNTER — Telehealth: Payer: Self-pay | Admitting: *Deleted

## 2013-07-16 NOTE — Telephone Encounter (Signed)
Per staff message and POF I have scheduled appts.  JMW  

## 2013-07-16 NOTE — Telephone Encounter (Signed)
Talked to pt this morning, she is aware of appt fro 08/12/12 lab, md  and IVIG

## 2013-07-18 ENCOUNTER — Other Ambulatory Visit: Payer: Self-pay | Admitting: Internal Medicine

## 2013-07-25 ENCOUNTER — Ambulatory Visit: Payer: Medicare Other | Admitting: Oncology

## 2013-07-29 ENCOUNTER — Ambulatory Visit: Payer: Medicare Other | Admitting: Oncology

## 2013-07-31 ENCOUNTER — Ambulatory Visit: Payer: Medicare Other | Admitting: Internal Medicine

## 2013-08-01 ENCOUNTER — Ambulatory Visit: Payer: Medicare Other

## 2013-08-01 ENCOUNTER — Ambulatory Visit: Payer: Medicare Other | Admitting: Oncology

## 2013-08-01 ENCOUNTER — Other Ambulatory Visit: Payer: Medicare Other

## 2013-08-10 ENCOUNTER — Other Ambulatory Visit: Payer: Self-pay | Admitting: Internal Medicine

## 2013-08-10 ENCOUNTER — Other Ambulatory Visit: Payer: Self-pay | Admitting: Oncology

## 2013-08-11 ENCOUNTER — Other Ambulatory Visit: Payer: Self-pay | Admitting: Internal Medicine

## 2013-08-12 ENCOUNTER — Telehealth: Payer: Self-pay | Admitting: *Deleted

## 2013-08-12 ENCOUNTER — Telehealth: Payer: Self-pay | Admitting: Oncology

## 2013-08-12 ENCOUNTER — Ambulatory Visit (HOSPITAL_BASED_OUTPATIENT_CLINIC_OR_DEPARTMENT_OTHER): Payer: Medicare Other | Admitting: Oncology

## 2013-08-12 ENCOUNTER — Other Ambulatory Visit (HOSPITAL_BASED_OUTPATIENT_CLINIC_OR_DEPARTMENT_OTHER): Payer: Medicare Other

## 2013-08-12 ENCOUNTER — Ambulatory Visit (HOSPITAL_BASED_OUTPATIENT_CLINIC_OR_DEPARTMENT_OTHER): Payer: Medicare Other

## 2013-08-12 VITALS — BP 145/71 | HR 75 | Temp 97.9°F | Resp 18 | Ht 64.0 in | Wt 149.1 lb

## 2013-08-12 VITALS — BP 142/76 | HR 63 | Temp 97.6°F | Resp 16

## 2013-08-12 DIAGNOSIS — C9110 Chronic lymphocytic leukemia of B-cell type not having achieved remission: Secondary | ICD-10-CM

## 2013-08-12 DIAGNOSIS — D801 Nonfamilial hypogammaglobulinemia: Secondary | ICD-10-CM

## 2013-08-12 LAB — CBC WITH DIFFERENTIAL/PLATELET
BASO%: 0.3 % (ref 0.0–2.0)
Basophils Absolute: 0 10*3/uL (ref 0.0–0.1)
EOS%: 1.1 % (ref 0.0–7.0)
Eosinophils Absolute: 0.1 10*3/uL (ref 0.0–0.5)
HEMATOCRIT: 39.3 % (ref 34.8–46.6)
HGB: 13.4 g/dL (ref 11.6–15.9)
LYMPH%: 35.9 % (ref 14.0–49.7)
MCH: 31.5 pg (ref 25.1–34.0)
MCHC: 34.1 g/dL (ref 31.5–36.0)
MCV: 92.3 fL (ref 79.5–101.0)
MONO#: 0.5 10*3/uL (ref 0.1–0.9)
MONO%: 7.3 % (ref 0.0–14.0)
NEUT#: 3.6 10*3/uL (ref 1.5–6.5)
NEUT%: 55.4 % (ref 38.4–76.8)
Platelets: 160 10*3/uL (ref 145–400)
RBC: 4.26 10*6/uL (ref 3.70–5.45)
RDW: 14.1 % (ref 11.2–14.5)
WBC: 6.6 10*3/uL (ref 3.9–10.3)
lymph#: 2.4 10*3/uL (ref 0.9–3.3)

## 2013-08-12 MED ORDER — IMMUNE GLOBULIN (HUMAN) 20 GM/200ML IV SOLN
0.4000 g/kg | Freq: Once | INTRAVENOUS | Status: AC
  Administered 2013-08-12: 30 g via INTRAVENOUS
  Filled 2013-08-12: qty 300

## 2013-08-12 MED ORDER — DIPHENHYDRAMINE HCL 25 MG PO CAPS
ORAL_CAPSULE | ORAL | Status: AC
  Filled 2013-08-12: qty 1

## 2013-08-12 MED ORDER — ACETAMINOPHEN 325 MG PO TABS
325.0000 mg | ORAL_TABLET | Freq: Once | ORAL | Status: AC
  Administered 2013-08-12: 325 mg via ORAL

## 2013-08-12 MED ORDER — DIPHENHYDRAMINE HCL 25 MG PO CAPS
25.0000 mg | ORAL_CAPSULE | Freq: Once | ORAL | Status: AC
  Administered 2013-08-12: 25 mg via ORAL

## 2013-08-12 MED ORDER — IMMUNE GLOBULIN (HUMAN) 10 GM/100ML IV SOLN: 30.0000 g | Freq: Once | INTRAVENOUS | Status: DC

## 2013-08-12 MED ORDER — ACETAMINOPHEN 325 MG PO TABS
ORAL_TABLET | ORAL | Status: AC
  Filled 2013-08-12: qty 1

## 2013-08-12 NOTE — Patient Instructions (Signed)

## 2013-08-12 NOTE — Progress Notes (Signed)
   [place]    OFFICE PROGRESS NOTE   INTERVAL HISTORY:   She returns for scheduled followup of chronic lymphocytic leukemia. No recent infection. She continues monthly IVIG. She feels well. She fell last month and fractured her right upper arm. She reports the arm is healing.  Objective:  Vital signs in last 24 hours:  Blood pressure 145/71, pulse 75, temperature 97.9 F (36.6 C), temperature source Oral, resp. rate 18, height 5\' 4"  (1.626 m), weight 149 lb 1.6 oz (67.631 kg), SpO2 98.00%.    HEENT: Neck without mass Lymphatics: No cervical, supraclavicular, or axillary nodes Resp: Lungs clear bilaterally Cardio: Regular rate and rhythm GI: No hepatosplenomegaly Vascular: No leg edema   Lab Results:  Lab Results  Component Value Date   WBC 6.6 08/12/2013   HGB 13.4 08/12/2013   HCT 39.3 08/12/2013   MCV 92.3 08/12/2013   PLT 160 08/12/2013   NEUTROABS 3.6 08/12/2013      Medications: I have reviewed the patient's current medications.  Assessment/Plan: 1. Chronic lymphocytic leukemia, asymptomatic. 2. History of hypogammaglobulinemia with recurrent infections. She continues monthly IVIG replacement therapy. 3. History of recurrent urinary tract infections. 4. History of Mild thrombocytopenia secondary to CLL.    Disposition:  Ms. Willenbring appears stable from a hematologic standpoint. She will continue monthly IVIG. She will return for an office visit in 4 months.   Betsy Coder, MD  08/12/2013  11:54 AM

## 2013-08-12 NOTE — Telephone Encounter (Signed)
Per staff message and POF I have scheduled appts.  JMW  

## 2013-08-12 NOTE — Telephone Encounter (Signed)
Gave pt appt for lab and Md and IViG until May 2015

## 2013-08-12 NOTE — Telephone Encounter (Signed)
Gave pt appt for lab and md, for May emailed Michelle regarding IVIG

## 2013-08-21 ENCOUNTER — Telehealth: Payer: Self-pay

## 2013-08-21 NOTE — Telephone Encounter (Signed)
Pls sch separately. We need to sch a procedure room Thx

## 2013-08-21 NOTE — Telephone Encounter (Signed)
Phone call from patient [phone] stating she has an appt to see you for a 3 month f/u on 08/26/13 under a 15 min appt. She asks will you be able to take growths off her face at that time also or will she need to schedule a separate appt for that?

## 2013-08-22 NOTE — Telephone Encounter (Signed)
Patient has been notified and states she will schedule for the procedure when she is here next week.

## 2013-08-26 ENCOUNTER — Other Ambulatory Visit (INDEPENDENT_AMBULATORY_CARE_PROVIDER_SITE_OTHER): Payer: Medicare Other

## 2013-08-26 ENCOUNTER — Ambulatory Visit (INDEPENDENT_AMBULATORY_CARE_PROVIDER_SITE_OTHER): Payer: Medicare Other | Admitting: Internal Medicine

## 2013-08-26 ENCOUNTER — Encounter: Payer: Self-pay | Admitting: Internal Medicine

## 2013-08-26 VITALS — BP 150/80 | HR 80 | Temp 97.3°F | Resp 16 | Wt 144.0 lb

## 2013-08-26 DIAGNOSIS — S42293A Other displaced fracture of upper end of unspecified humerus, initial encounter for closed fracture: Secondary | ICD-10-CM

## 2013-08-26 DIAGNOSIS — E785 Hyperlipidemia, unspecified: Secondary | ICD-10-CM

## 2013-08-26 DIAGNOSIS — I10 Essential (primary) hypertension: Secondary | ICD-10-CM

## 2013-08-26 DIAGNOSIS — E538 Deficiency of other specified B group vitamins: Secondary | ICD-10-CM

## 2013-08-26 DIAGNOSIS — L570 Actinic keratosis: Secondary | ICD-10-CM | POA: Insufficient documentation

## 2013-08-26 LAB — HEPATIC FUNCTION PANEL
ALT: 18 U/L (ref 0–35)
AST: 23 U/L (ref 0–37)
Albumin: 4.4 g/dL (ref 3.5–5.2)
Alkaline Phosphatase: 62 U/L (ref 39–117)
BILIRUBIN TOTAL: 0.8 mg/dL (ref 0.3–1.2)
Bilirubin, Direct: 0.1 mg/dL (ref 0.0–0.3)
Total Protein: 8.4 g/dL — ABNORMAL HIGH (ref 6.0–8.3)

## 2013-08-26 LAB — TSH: TSH: 2.51 u[IU]/mL (ref 0.35–5.50)

## 2013-08-26 LAB — LIPID PANEL
Cholesterol: 187 mg/dL (ref 0–200)
HDL: 65.7 mg/dL (ref >39.00)
LDL CALC: 107 mg/dL — AB (ref 0–99)
TRIGLYCERIDES: 74 mg/dL (ref 0.0–149.0)
Total CHOL/HDL Ratio: 3
VLDL: 14.8 mg/dL (ref 0.0–40.0)

## 2013-08-26 LAB — BASIC METABOLIC PANEL
BUN: 9 mg/dL (ref 6–23)
CO2: 29 mEq/L (ref 19–32)
CREATININE: 0.8 mg/dL (ref 0.4–1.2)
Calcium: 9.9 mg/dL (ref 8.4–10.5)
Chloride: 98 mEq/L (ref 96–112)
GFR: 75.71 mL/min (ref >60.00)
Glucose, Bld: 101 mg/dL — ABNORMAL HIGH (ref 70–99)
POTASSIUM: 3.4 meq/L — AB (ref 3.5–5.1)
Sodium: 136 mEq/L (ref 135–145)

## 2013-08-26 MED ORDER — CYANOCOBALAMIN 1000 MCG/ML IJ SOLN
1000.0000 ug | Freq: Once | INTRAMUSCULAR | Status: AC
  Administered 2013-08-26: 1000 ug via INTRAMUSCULAR

## 2013-08-26 NOTE — Progress Notes (Signed)
Pre visit review using our clinic review tool, if applicable. No additional management support is needed unless otherwise documented below in the visit note. 

## 2013-08-26 NOTE — Assessment & Plan Note (Signed)
Continue with current prescription therapy as reflected on the Med list.  

## 2013-08-26 NOTE — Patient Instructions (Signed)
   Postprocedure instructions :     Keep the wounds clean. You can wash them with liquid soap and water. Pat dry with gauze or a Kleenex tissue  Before applying antibiotic ointment and a Band-Aid.   You need to report immediately  if  any signs of infection develop.    

## 2013-08-26 NOTE — Assessment & Plan Note (Signed)
Continue with current prescription therapy as reflected on the Med list. Labs  

## 2013-08-26 NOTE — Assessment & Plan Note (Signed)
See Procedure 

## 2013-08-26 NOTE — Progress Notes (Signed)
Subjective:    HPI   The patient presents for a follow-up of  chronic hypertension, chronic dyslipidemia, anxiety (mild) controlled with medicines; halitosis. BP is good home. Lost some wt. F/u Vit B12 def - Nascobal was $$$ She fell on Dec 7th 2014 and fractured her right upper arm. She reports the arm is healing.  Her 2nd son had a CVA in 2.13  BP Readings from Last 3 Encounters:  08/26/13 150/80  08/12/13 142/76  08/12/13 145/71   Wt Readings from Last 3 Encounters:  08/26/13 144 lb (65.318 kg)  08/12/13 149 lb 1.6 oz (67.631 kg)  05/01/13 150 lb (68.04 kg)       Review of Systems  Constitutional: Positive for fatigue. Negative for fever, chills, diaphoresis, activity change, appetite change and unexpected weight change.  HENT: Negative for congestion, ear pain, facial swelling, hearing loss, mouth sores, nosebleeds, postnasal drip, rhinorrhea, sinus pressure, sneezing, sore throat, tinnitus and trouble swallowing.   Eyes: Negative for pain, discharge, redness, itching and visual disturbance.  Respiratory: Negative for cough, chest tightness, shortness of breath, wheezing and stridor.   Cardiovascular: Negative for chest pain, palpitations and leg swelling.  Gastrointestinal: Negative for nausea, diarrhea, constipation, blood in stool, abdominal distention, anal bleeding and rectal pain.  Genitourinary: Negative for dysuria, urgency, frequency, hematuria, flank pain, vaginal bleeding, vaginal discharge, difficulty urinating, genital sores and pelvic pain.  Musculoskeletal: Negative for arthralgias, back pain, gait problem, joint swelling, neck pain and neck stiffness.  Skin: Negative.  Negative for rash.  Neurological: Negative for dizziness, tremors, seizures, syncope, speech difficulty, weakness, numbness and headaches.  Hematological: Negative for adenopathy. Does not bruise/bleed easily.  Psychiatric/Behavioral: Negative for suicidal ideas, behavioral problems, sleep  disturbance, dysphoric mood and decreased concentration. The patient is nervous/anxious.        Objective:   Physical Exam  Constitutional: She appears well-developed. No distress.  obese  HENT:  Head: Normocephalic.  Right Ear: External ear normal.  Left Ear: External ear normal.  Nose: Nose normal.  Mouth/Throat: Oropharynx is clear and moist.  No halitosis  Eyes: Conjunctivae are normal. Pupils are equal, round, and reactive to light. Right eye exhibits no discharge. Left eye exhibits no discharge.  Neck: Normal range of motion. Neck supple. No JVD present. No tracheal deviation present. No thyromegaly present.  Cardiovascular: Normal rate, regular rhythm and normal heart sounds.   Pulmonary/Chest: No stridor. No respiratory distress. She has no wheezes.  Abdominal: Soft. Bowel sounds are normal. She exhibits no distension and no mass. There is no tenderness. There is no rebound and no guarding.  Musculoskeletal: She exhibits no edema and no tenderness.  Lymphadenopathy:    She has no cervical adenopathy.  Neurological: She displays normal reflexes. No cranial nerve deficit. She exhibits normal muscle tone. Coordination normal.  Skin: No rash noted. No erythema.  Psychiatric: She has a normal mood and affect. Her behavior is normal. Judgment and thought content normal.  R shoulder is tender w/ROM, restricted in ROM AKs on face  Lab Results  Component Value Date   WBC 6.6 08/12/2013   HGB 13.4 08/12/2013   HCT 39.3 08/12/2013   PLT 160 08/12/2013   GLUCOSE 88 01/29/2013   CHOL 159 01/29/2013   TRIG 69.0 01/29/2013   HDL 59.10 01/29/2013   LDLCALC 86 01/29/2013   ALT 21 01/29/2013   AST 22 01/29/2013   NA 137 01/29/2013   K 4.0 01/29/2013   CL 101 01/29/2013   CREATININE 0.8 01/29/2013  BUN 14 01/29/2013   CO2 31 01/29/2013   TSH 3.21 01/29/2013   INR 1.0 10/07/2008   HGBA1C  Value: 5.9 (NOTE)   The ADA recommends the following therapeutic goal for glycemic   control related to Hgb A1C  measurement:   Goal of Therapy:   < 7.0% Hgb A1C   Reference: American Diabetes Association: Clinical Practice   Recommendations 2008, Diabetes Care,  2008, 31:(Suppl 1). 10/07/2008   MICROALBUR 0.4 07/14/2009     Procedure Note :     Procedure : Cryosurgery   Indication:   Actinic keratosis(es)   Risks including unsuccessful procedure , bleeding, infection, bruising, scar, a need for a repeat  procedure and others were explained to the patient in detail as well as the benefits. Informed consent was obtained verbally.   2  lesion(s)  On face    was/were treated with liquid nitrogen on a Q-tip in a usual fasion . Band-Aid was applied and antibiotic ointment was given for a later use.   Tolerated well. Complications none.        Assessment & Plan:

## 2013-08-26 NOTE — Assessment & Plan Note (Signed)
She fell on Dec 7th 2014 and fractured her right upper arm. She reports the arm is healing. In PT

## 2013-09-08 ENCOUNTER — Telehealth: Payer: Self-pay | Admitting: *Deleted

## 2013-09-08 ENCOUNTER — Other Ambulatory Visit: Payer: Self-pay | Admitting: Internal Medicine

## 2013-09-08 NOTE — Telephone Encounter (Signed)
Patient phoned stating she had been seen [facility], had an EKG performed, that showed possible a-fib and was advised to contact PCP.  Upon patient's history review, advised patient to schedule f/u appt with PCP.  Transferred her to scheduling.

## 2013-09-09 ENCOUNTER — Ambulatory Visit: Payer: Medicare Other

## 2013-09-09 ENCOUNTER — Telehealth: Payer: Self-pay | Admitting: *Deleted

## 2013-09-09 ENCOUNTER — Other Ambulatory Visit: Payer: Self-pay | Admitting: Internal Medicine

## 2013-09-09 NOTE — Telephone Encounter (Signed)
Pt called to cancel IVIG infusion. Order sent to schedulers to R/S.

## 2013-09-10 ENCOUNTER — Telehealth: Payer: Self-pay | Admitting: *Deleted

## 2013-09-10 NOTE — Telephone Encounter (Signed)
Please see 09/08/13 documentation.  Transferred to scheduling for OV with PCP.

## 2013-09-16 ENCOUNTER — Telehealth: Payer: Self-pay | Admitting: *Deleted

## 2013-09-16 ENCOUNTER — Ambulatory Visit: Payer: Medicare Other

## 2013-09-16 ENCOUNTER — Other Ambulatory Visit: Payer: Self-pay | Admitting: *Deleted

## 2013-09-16 NOTE — Telephone Encounter (Signed)
Patient called and canceled appts for today due to weather. I have moved the appt to next week.    JMW

## 2013-09-17 ENCOUNTER — Telehealth: Payer: Self-pay | Admitting: *Deleted

## 2013-09-17 NOTE — Telephone Encounter (Signed)
Pa tient called to move her appt from 3/3 to 3/10. I have transferred her to the desk RN to check on appts

## 2013-09-17 NOTE — Telephone Encounter (Signed)
Message from pt requesting to reschedule IVIG appt to 3/10 due to transportation issues. Left message for infusion room scheduler to make changes. Will cancel 3/17 IVIG appt. Pt is to get monthly infusions. Last dose 08/12/13.

## 2013-09-18 ENCOUNTER — Telehealth: Payer: Self-pay | Admitting: *Deleted

## 2013-09-18 NOTE — Telephone Encounter (Signed)
Per desk RN I have moved appt from 3/3 to 3/10. Cancel 3/17. Patient aware   JMW

## 2013-09-19 ENCOUNTER — Ambulatory Visit: Payer: Medicare Other | Admitting: Internal Medicine

## 2013-09-23 ENCOUNTER — Encounter (INDEPENDENT_AMBULATORY_CARE_PROVIDER_SITE_OTHER): Payer: Self-pay

## 2013-09-23 ENCOUNTER — Ambulatory Visit: Payer: Medicare Other

## 2013-09-23 ENCOUNTER — Ambulatory Visit (HOSPITAL_BASED_OUTPATIENT_CLINIC_OR_DEPARTMENT_OTHER): Payer: Medicare Other

## 2013-09-23 VITALS — BP 139/79 | HR 58 | Temp 98.0°F | Resp 20

## 2013-09-23 DIAGNOSIS — D801 Nonfamilial hypogammaglobulinemia: Secondary | ICD-10-CM

## 2013-09-23 DIAGNOSIS — C9110 Chronic lymphocytic leukemia of B-cell type not having achieved remission: Secondary | ICD-10-CM

## 2013-09-23 MED ORDER — DIPHENHYDRAMINE HCL 25 MG PO CAPS
25.0000 mg | ORAL_CAPSULE | Freq: Once | ORAL | Status: AC
  Administered 2013-09-23: 25 mg via ORAL

## 2013-09-23 MED ORDER — DIPHENHYDRAMINE HCL 25 MG PO CAPS
ORAL_CAPSULE | ORAL | Status: AC
  Filled 2013-09-23: qty 1

## 2013-09-23 MED ORDER — IMMUNE GLOBULIN (HUMAN) 10 GM/100ML IV SOLN
30.0000 g | Freq: Once | INTRAVENOUS | Status: AC
  Administered 2013-09-23: 30 g via INTRAVENOUS
  Filled 2013-09-23: qty 300

## 2013-09-23 NOTE — Progress Notes (Signed)
Patient states she took a tylenol before arriving to the infusion room.

## 2013-09-23 NOTE — Patient Instructions (Signed)

## 2013-09-29 ENCOUNTER — Encounter: Payer: Self-pay | Admitting: Internal Medicine

## 2013-09-29 ENCOUNTER — Ambulatory Visit (INDEPENDENT_AMBULATORY_CARE_PROVIDER_SITE_OTHER): Payer: Medicare Other | Admitting: Internal Medicine

## 2013-09-29 VITALS — BP 130/80 | HR 80 | Temp 97.4°F | Resp 16 | Wt 145.0 lb

## 2013-09-29 DIAGNOSIS — I10 Essential (primary) hypertension: Secondary | ICD-10-CM

## 2013-09-29 DIAGNOSIS — Z862 Personal history of diseases of the blood and blood-forming organs and certain disorders involving the immune mechanism: Secondary | ICD-10-CM

## 2013-09-29 DIAGNOSIS — E538 Deficiency of other specified B group vitamins: Secondary | ICD-10-CM

## 2013-09-29 DIAGNOSIS — F411 Generalized anxiety disorder: Secondary | ICD-10-CM

## 2013-09-29 DIAGNOSIS — F419 Anxiety disorder, unspecified: Secondary | ICD-10-CM

## 2013-09-29 DIAGNOSIS — I499 Cardiac arrhythmia, unspecified: Secondary | ICD-10-CM

## 2013-09-29 NOTE — Progress Notes (Signed)
Subjective:    HPI   The patient presents for a follow-up of  chronic hypertension, chronic dyslipidemia, anxiety (mild) controlled with medicines; halitosis. BP is good home. Lost some wt. F/u Vit B12 def - Nascobal was $$$ She fell on Dec 7th 2014 and fractured her right upper arm. She reports the arm is healing.  She had a [facility] - found to have A fib  Her 2nd son had a CVA in 2.13  BP Readings from Last 3 Encounters:  09/29/13 130/80  09/23/13 139/79  08/26/13 150/80   Wt Readings from Last 3 Encounters:  09/29/13 145 lb (65.772 kg)  08/26/13 144 lb (65.318 kg)  08/12/13 149 lb 1.6 oz (67.631 kg)       Review of Systems  Constitutional: Positive for fatigue. Negative for fever, chills, diaphoresis, activity change, appetite change and unexpected weight change.  HENT: Negative for congestion, ear pain, facial swelling, hearing loss, mouth sores, nosebleeds, postnasal drip, rhinorrhea, sinus pressure, sneezing, sore throat, tinnitus and trouble swallowing.   Eyes: Negative for pain, discharge, redness, itching and visual disturbance.  Respiratory: Negative for cough, chest tightness, shortness of breath, wheezing and stridor.   Cardiovascular: Negative for chest pain, palpitations and leg swelling.  Gastrointestinal: Negative for nausea, diarrhea, constipation, blood in stool, abdominal distention, anal bleeding and rectal pain.  Genitourinary: Negative for dysuria, urgency, frequency, hematuria, flank pain, vaginal bleeding, vaginal discharge, difficulty urinating, genital sores and pelvic pain.  Musculoskeletal: Negative for arthralgias, back pain, gait problem, joint swelling, neck pain and neck stiffness.  Skin: Negative.  Negative for rash.  Neurological: Negative for dizziness, tremors, seizures, syncope, speech difficulty, weakness, numbness and headaches.  Hematological: Negative for adenopathy. Does not bruise/bleed easily.  Psychiatric/Behavioral:  Negative for suicidal ideas, behavioral problems, sleep disturbance, dysphoric mood and decreased concentration. The patient is nervous/anxious.        Objective:   Physical Exam  Constitutional: She appears well-developed. No distress.  obese  HENT:  Head: Normocephalic.  Right Ear: External ear normal.  Left Ear: External ear normal.  Nose: Nose normal.  Mouth/Throat: Oropharynx is clear and moist.  No halitosis  Eyes: Conjunctivae are normal. Pupils are equal, round, and reactive to light. Right eye exhibits no discharge. Left eye exhibits no discharge.  Neck: Normal range of motion. Neck supple. No JVD present. No tracheal deviation present. No thyromegaly present.  Cardiovascular: Normal rate, regular rhythm and normal heart sounds.   occ irreg beats  Pulmonary/Chest: No stridor. No respiratory distress. She has no wheezes.  Abdominal: Soft. Bowel sounds are normal. She exhibits no distension and no mass. There is no tenderness. There is no rebound and no guarding.  Musculoskeletal: She exhibits no edema and no tenderness.  Lymphadenopathy:    She has no cervical adenopathy.  Neurological: She displays normal reflexes. No cranial nerve deficit. She exhibits normal muscle tone. Coordination normal.  Skin: No rash noted. No erythema.  Psychiatric: She has a normal mood and affect. Her behavior is normal. Judgment and thought content normal.  R shoulder is tender w/ROM, restricted in ROM - a little better AKs on face  Lab Results  Component Value Date   WBC 6.6 08/12/2013   HGB 13.4 08/12/2013   HCT 39.3 08/12/2013   PLT 160 08/12/2013   GLUCOSE 101* 08/26/2013   CHOL 187 08/26/2013   TRIG 74.0 08/26/2013   HDL 65.70 08/26/2013   LDLCALC 107* 08/26/2013   ALT 18 08/26/2013   AST 23  08/26/2013   NA 136 08/26/2013   K 3.4* 08/26/2013   CL 98 08/26/2013   CREATININE 0.8 08/26/2013   BUN 9 08/26/2013   CO2 29 08/26/2013   TSH 2.51 08/26/2013   INR 1.0 10/07/2008   HGBA1C  Value: 5.9 (NOTE)   The ADA  recommends the following therapeutic goal for glycemic   control related to Hgb A1C measurement:   Goal of Therapy:   < 7.0% Hgb A1C   Reference: American Diabetes Association: Clinical Practice   Recommendations 2008, Diabetes Care,  2008, 31:(Suppl 1). 10/07/2008   MICROALBUR 0.4 07/14/2009    EKG - no A fib      Assessment & Plan:

## 2013-09-29 NOTE — Assessment & Plan Note (Signed)
F/u w/hem-onc

## 2013-09-29 NOTE — Assessment & Plan Note (Signed)
Continue with current prescription therapy as reflected on the Med list.  

## 2013-09-29 NOTE — Assessment & Plan Note (Signed)
Cont Rx 

## 2013-09-29 NOTE — Assessment & Plan Note (Signed)
No A fib

## 2013-09-29 NOTE — Progress Notes (Signed)
Pre visit review using our clinic review tool, if applicable. No additional management support is needed unless otherwise documented below in the visit note. 

## 2013-09-30 ENCOUNTER — Ambulatory Visit: Payer: Medicare Other

## 2013-10-07 ENCOUNTER — Ambulatory Visit: Payer: Medicare Other

## 2013-10-08 ENCOUNTER — Other Ambulatory Visit: Payer: Self-pay | Admitting: Internal Medicine

## 2013-11-04 ENCOUNTER — Ambulatory Visit (HOSPITAL_BASED_OUTPATIENT_CLINIC_OR_DEPARTMENT_OTHER): Payer: Medicare Other

## 2013-11-04 VITALS — BP 122/72 | HR 59 | Temp 97.1°F | Resp 20

## 2013-11-04 DIAGNOSIS — C9110 Chronic lymphocytic leukemia of B-cell type not having achieved remission: Secondary | ICD-10-CM

## 2013-11-04 DIAGNOSIS — D801 Nonfamilial hypogammaglobulinemia: Secondary | ICD-10-CM

## 2013-11-04 MED ORDER — DIPHENHYDRAMINE HCL 25 MG PO CAPS
25.0000 mg | ORAL_CAPSULE | Freq: Once | ORAL | Status: AC
  Administered 2013-11-04: 25 mg via ORAL

## 2013-11-04 MED ORDER — IMMUNE GLOBULIN (HUMAN) 10 GM/100ML IV SOLN
30.0000 g | Freq: Once | INTRAVENOUS | Status: AC
  Administered 2013-11-04: 30 g via INTRAVENOUS
  Filled 2013-11-04: qty 300

## 2013-11-04 MED ORDER — SODIUM CHLORIDE 0.9 % IV SOLN
Freq: Once | INTRAVENOUS | Status: AC
  Administered 2013-11-04: 10:00:00 via INTRAVENOUS

## 2013-11-04 NOTE — Patient Instructions (Signed)

## 2013-11-07 ENCOUNTER — Other Ambulatory Visit: Payer: Self-pay | Admitting: Internal Medicine

## 2013-11-14 ENCOUNTER — Telehealth: Payer: Self-pay | Admitting: Oncology

## 2013-11-14 NOTE — Telephone Encounter (Signed)
returned pt call and lvm for pt to call back with d.t she is trying to r/s her IVIG to

## 2013-11-17 ENCOUNTER — Telehealth: Payer: Self-pay | Admitting: *Deleted

## 2013-11-17 NOTE — Telephone Encounter (Signed)
Patient called and moved her appt from 5/12 to 5/20

## 2013-12-02 ENCOUNTER — Ambulatory Visit: Payer: Medicare Other

## 2013-12-10 ENCOUNTER — Other Ambulatory Visit (HOSPITAL_BASED_OUTPATIENT_CLINIC_OR_DEPARTMENT_OTHER): Payer: Medicare Other

## 2013-12-10 ENCOUNTER — Telehealth: Payer: Self-pay | Admitting: *Deleted

## 2013-12-10 ENCOUNTER — Ambulatory Visit (HOSPITAL_BASED_OUTPATIENT_CLINIC_OR_DEPARTMENT_OTHER): Payer: Medicare Other

## 2013-12-10 ENCOUNTER — Telehealth: Payer: Self-pay | Admitting: Oncology

## 2013-12-10 ENCOUNTER — Ambulatory Visit (HOSPITAL_BASED_OUTPATIENT_CLINIC_OR_DEPARTMENT_OTHER): Payer: Medicare Other | Admitting: Nurse Practitioner

## 2013-12-10 VITALS — BP 126/60 | HR 71 | Temp 98.0°F | Resp 18 | Ht 64.0 in | Wt 141.9 lb

## 2013-12-10 VITALS — BP 131/57 | HR 62 | Temp 98.6°F | Resp 16

## 2013-12-10 DIAGNOSIS — Z87440 Personal history of urinary (tract) infections: Secondary | ICD-10-CM

## 2013-12-10 DIAGNOSIS — C9110 Chronic lymphocytic leukemia of B-cell type not having achieved remission: Secondary | ICD-10-CM

## 2013-12-10 DIAGNOSIS — D801 Nonfamilial hypogammaglobulinemia: Secondary | ICD-10-CM

## 2013-12-10 LAB — CBC WITH DIFFERENTIAL/PLATELET
BASO%: 0.3 % (ref 0.0–2.0)
BASOS ABS: 0 10*3/uL (ref 0.0–0.1)
EOS ABS: 0.1 10*3/uL (ref 0.0–0.5)
EOS%: 2 % (ref 0.0–7.0)
HCT: 39.6 % (ref 34.8–46.6)
HGB: 13.7 g/dL (ref 11.6–15.9)
LYMPH%: 37.8 % (ref 14.0–49.7)
MCH: 30.9 pg (ref 25.1–34.0)
MCHC: 34.6 g/dL (ref 31.5–36.0)
MCV: 89.4 fL (ref 79.5–101.0)
MONO#: 0.5 10*3/uL (ref 0.1–0.9)
MONO%: 7.9 % (ref 0.0–14.0)
NEUT%: 52 % (ref 38.4–76.8)
NEUTROS ABS: 3.6 10*3/uL (ref 1.5–6.5)
PLATELETS: 139 10*3/uL — AB (ref 145–400)
RBC: 4.43 10*6/uL (ref 3.70–5.45)
RDW: 13.9 % (ref 11.2–14.5)
WBC: 6.9 10*3/uL (ref 3.9–10.3)
lymph#: 2.6 10*3/uL (ref 0.9–3.3)
nRBC: 0 % (ref 0–0)

## 2013-12-10 MED ORDER — IMMUNE GLOBULIN (HUMAN) 10 GM/100ML IV SOLN
30.0000 g | Freq: Once | INTRAVENOUS | Status: AC
  Administered 2013-12-10: 30 g via INTRAVENOUS
  Filled 2013-12-10: qty 300

## 2013-12-10 MED ORDER — DIPHENHYDRAMINE HCL 25 MG PO CAPS
ORAL_CAPSULE | ORAL | Status: AC
  Filled 2013-12-10: qty 1

## 2013-12-10 MED ORDER — DIPHENHYDRAMINE HCL 25 MG PO CAPS
25.0000 mg | ORAL_CAPSULE | Freq: Once | ORAL | Status: AC
  Administered 2013-12-10: 25 mg via ORAL

## 2013-12-10 NOTE — Telephone Encounter (Signed)
Per staff message and POF I have scheduled appts.  JMW  

## 2013-12-10 NOTE — Progress Notes (Signed)
  [facility] OFFICE PROGRESS NOTE   Diagnosis:  CLL.  INTERVAL HISTORY:   [name] returns as scheduled. She overall feels well. No recent infections. No fevers or sweats. She reports a good appetite. No shortness of breath or cough. She denies pain. No bowel or bladder problems. She reports her son died in 18-Nov-2022 of this year of an "infection". He had a stroke approximately 10 years ago.  Objective:  Vital signs in last 24 hours:  Blood pressure 126/60, pulse 71, temperature 98 F (36.7 C), temperature source Oral, resp. rate 18, height 5\' 4"  (1.626 m), weight 141 lb 14.4 oz (64.365 kg).    HEENT: No thrush or ulcerations. Lymphatics: No palpable cervical, supraclavicular, axillary or inguinal lymph nodes. Resp: Lungs clear. Cardio: Regular cardiac rhythm. GI: Abdomen soft and nontender. No organomegaly. Vascular: No leg edema. Calves soft and nontender.    Lab Results:  Lab Results  Component Value Date   WBC 6.9 12/10/2013   HGB 13.7 12/10/2013   HCT 39.6 12/10/2013   MCV 89.4 12/10/2013   PLT 139* 12/10/2013   NEUTROABS 3.6 12/10/2013    Imaging:  No results found.  Medications: I have reviewed the patient's current medications.  Assessment/Plan: 1. Chronic lymphocytic leukemia, asymptomatic. 2. History of hypogammaglobulinemia with recurrent infections. She continues monthly IVIG replacement therapy. 3. History of recurrent urinary tract infections. 4. History of mild thrombocytopenia secondary to CLL.    Disposition: She remains stable from a hematologic standpoint. Plan to continue monthly IVIG. She will return for a followup visit in 4 months.    Owens Shark ANP/GNP-BC   12/10/2013  9:49 AM

## 2013-12-10 NOTE — Telephone Encounter (Signed)
Gave pt appt for Md  and IVIG for June and September 2015

## 2013-12-10 NOTE — Patient Instructions (Signed)

## 2013-12-11 ENCOUNTER — Ambulatory Visit (INDEPENDENT_AMBULATORY_CARE_PROVIDER_SITE_OTHER): Payer: Medicare Other | Admitting: Internal Medicine

## 2013-12-11 ENCOUNTER — Encounter: Payer: Self-pay | Admitting: Internal Medicine

## 2013-12-11 VITALS — BP 126/72 | HR 77 | Temp 97.1°F | Wt 141.8 lb

## 2013-12-11 DIAGNOSIS — Z634 Disappearance and death of family member: Secondary | ICD-10-CM

## 2013-12-11 DIAGNOSIS — F4321 Adjustment disorder with depressed mood: Secondary | ICD-10-CM

## 2013-12-11 DIAGNOSIS — M1990 Unspecified osteoarthritis, unspecified site: Secondary | ICD-10-CM

## 2013-12-11 DIAGNOSIS — Z862 Personal history of diseases of the blood and blood-forming organs and certain disorders involving the immune mechanism: Secondary | ICD-10-CM

## 2013-12-11 DIAGNOSIS — I10 Essential (primary) hypertension: Secondary | ICD-10-CM

## 2013-12-11 DIAGNOSIS — E538 Deficiency of other specified B group vitamins: Secondary | ICD-10-CM

## 2013-12-11 DIAGNOSIS — C9110 Chronic lymphocytic leukemia of B-cell type not having achieved remission: Secondary | ICD-10-CM

## 2013-12-11 DIAGNOSIS — E785 Hyperlipidemia, unspecified: Secondary | ICD-10-CM

## 2013-12-11 NOTE — Assessment & Plan Note (Signed)
Continue with current prescription therapy as reflected on the Med list.  

## 2013-12-11 NOTE — Assessment & Plan Note (Signed)
Her son died on 11/10/13 in a NH - CVA Dr Berenice Primas Discussed

## 2013-12-11 NOTE — Progress Notes (Signed)
Pre visit review using our clinic review tool, if applicable. No additional management support is needed unless otherwise documented below in the visit note. 

## 2013-12-11 NOTE — Assessment & Plan Note (Signed)
F/u w/Dr Sherrill 

## 2013-12-11 NOTE — Progress Notes (Signed)
Subjective:    HPI  Her son died on Nov 28, 2013 in a NH - CVA. Seeing Dr Berenice Primas The patient presents for a follow-up of  chronic hypertension, chronic dyslipidemia, anxiety (mild) controlled with medicines; halitosis. BP is good home. Lost some wt. F/u Vit B12 def - Nascobal was $$$ She fell on Dec 7th 2014 and fractured her right upper arm. She reports the arm is healing.  She had a Life Line Screening - found to have A fib  Her 2nd son had a CVA in 2.13  BP Readings from Last 3 Encounters:  12/11/13 126/72  12/10/13 131/57  12/10/13 126/60   Wt Readings from Last 3 Encounters:  12/11/13 141 lb 12 oz (64.297 kg)  12/10/13 141 lb 14.4 oz (64.365 kg)  09/29/13 145 lb (65.772 kg)       Review of Systems  Constitutional: Positive for fatigue. Negative for fever, chills, diaphoresis, activity change, appetite change and unexpected weight change.  HENT: Negative for congestion, ear pain, facial swelling, hearing loss, mouth sores, nosebleeds, postnasal drip, rhinorrhea, sinus pressure, sneezing, sore throat, tinnitus and trouble swallowing.   Eyes: Negative for pain, discharge, redness, itching and visual disturbance.  Respiratory: Negative for cough, chest tightness, shortness of breath, wheezing and stridor.   Cardiovascular: Negative for chest pain, palpitations and leg swelling.  Gastrointestinal: Negative for nausea, diarrhea, constipation, blood in stool, abdominal distention, anal bleeding and rectal pain.  Genitourinary: Negative for dysuria, urgency, frequency, hematuria, flank pain, vaginal bleeding, vaginal discharge, difficulty urinating, genital sores and pelvic pain.  Musculoskeletal: Negative for arthralgias, back pain, gait problem, joint swelling, neck pain and neck stiffness.  Skin: Negative.  Negative for rash.  Neurological: Negative for dizziness, tremors, seizures, syncope, speech difficulty, weakness, numbness and headaches.  Hematological: Negative for  adenopathy. Does not bruise/bleed easily.  Psychiatric/Behavioral: Negative for suicidal ideas, behavioral problems, sleep disturbance, dysphoric mood and decreased concentration. The patient is nervous/anxious.        Objective:   Physical Exam  Constitutional: She appears well-developed. No distress.  obese  HENT:  Head: Normocephalic.  Right Ear: External ear normal.  Left Ear: External ear normal.  Nose: Nose normal.  Mouth/Throat: Oropharynx is clear and moist.  No halitosis  Eyes: Conjunctivae are normal. Pupils are equal, round, and reactive to light. Right eye exhibits no discharge. Left eye exhibits no discharge.  Neck: Normal range of motion. Neck supple. No JVD present. No tracheal deviation present. No thyromegaly present.  Cardiovascular: Normal rate, regular rhythm and normal heart sounds.   occ irreg beats  Pulmonary/Chest: No stridor. No respiratory distress. She has no wheezes.  Abdominal: Soft. Bowel sounds are normal. She exhibits no distension and no mass. There is no tenderness. There is no rebound and no guarding.  Musculoskeletal: She exhibits no edema and no tenderness.  Lymphadenopathy:    She has no cervical adenopathy.  Neurological: She displays normal reflexes. No cranial nerve deficit. She exhibits normal muscle tone. Coordination normal.  Skin: No rash noted. No erythema.  Psychiatric: She has a normal mood and affect. Her behavior is normal. Judgment and thought content normal.  R shoulder is tender w/ROM, restricted in ROM - a little better AKs on face  Lab Results  Component Value Date   WBC 6.9 12/10/2013   HGB 13.7 12/10/2013   HCT 39.6 12/10/2013   PLT 139* 12/10/2013   GLUCOSE 101* 08/26/2013   CHOL 187 08/26/2013   TRIG 74.0 08/26/2013   HDL 65.70 08/26/2013  LDLCALC 107* 08/26/2013   ALT 18 08/26/2013   AST 23 08/26/2013   NA 136 08/26/2013   K 3.4* 08/26/2013   CL 98 08/26/2013   CREATININE 0.8 08/26/2013   BUN 9 08/26/2013   CO2 29 08/26/2013   TSH 2.51  08/26/2013   INR 1.0 10/07/2008   HGBA1C  Value: 5.9 (NOTE)   The ADA recommends the following therapeutic goal for glycemic   control related to Hgb A1C measurement:   Goal of Therapy:   < 7.0% Hgb A1C   Reference: American Diabetes Association: Clinical Practice   Recommendations 2008, Diabetes Care,  2008, 31:(Suppl 1). 10/07/2008   MICROALBUR 0.4 07/14/2009    EKG - no A fib      Assessment & Plan:

## 2013-12-11 NOTE — Assessment & Plan Note (Signed)
Se meds

## 2014-01-06 ENCOUNTER — Other Ambulatory Visit: Payer: Self-pay | Admitting: *Deleted

## 2014-01-06 DIAGNOSIS — C9110 Chronic lymphocytic leukemia of B-cell type not having achieved remission: Secondary | ICD-10-CM

## 2014-01-07 ENCOUNTER — Ambulatory Visit (HOSPITAL_BASED_OUTPATIENT_CLINIC_OR_DEPARTMENT_OTHER): Payer: Medicare Other

## 2014-01-07 ENCOUNTER — Other Ambulatory Visit (HOSPITAL_BASED_OUTPATIENT_CLINIC_OR_DEPARTMENT_OTHER): Payer: Medicare Other

## 2014-01-07 VITALS — BP 132/72 | HR 60 | Temp 97.1°F | Resp 16

## 2014-01-07 DIAGNOSIS — C9110 Chronic lymphocytic leukemia of B-cell type not having achieved remission: Secondary | ICD-10-CM

## 2014-01-07 DIAGNOSIS — D801 Nonfamilial hypogammaglobulinemia: Secondary | ICD-10-CM

## 2014-01-07 LAB — CBC WITH DIFFERENTIAL/PLATELET
BASO%: 0.1 % (ref 0.0–2.0)
Basophils Absolute: 0 10*3/uL (ref 0.0–0.1)
EOS%: 1.6 % (ref 0.0–7.0)
Eosinophils Absolute: 0.1 10*3/uL (ref 0.0–0.5)
HEMATOCRIT: 38.7 % (ref 34.8–46.6)
HGB: 13.5 g/dL (ref 11.6–15.9)
LYMPH%: 40.2 % (ref 14.0–49.7)
MCH: 31.1 pg (ref 25.1–34.0)
MCHC: 34.9 g/dL (ref 31.5–36.0)
MCV: 89.2 fL (ref 79.5–101.0)
MONO#: 0.5 10*3/uL (ref 0.1–0.9)
MONO%: 7.3 % (ref 0.0–14.0)
NEUT#: 3.4 10*3/uL (ref 1.5–6.5)
NEUT%: 50.8 % (ref 38.4–76.8)
Platelets: 130 10*3/uL — ABNORMAL LOW (ref 145–400)
RBC: 4.34 10*6/uL (ref 3.70–5.45)
RDW: 13.8 % (ref 11.2–14.5)
WBC: 6.7 10*3/uL (ref 3.9–10.3)
lymph#: 2.7 10*3/uL (ref 0.9–3.3)

## 2014-01-07 MED ORDER — DIPHENHYDRAMINE HCL 25 MG PO CAPS
25.0000 mg | ORAL_CAPSULE | Freq: Once | ORAL | Status: AC
  Administered 2014-01-07: 25 mg via ORAL

## 2014-01-07 MED ORDER — SODIUM CHLORIDE 0.9 % IV SOLN
INTRAVENOUS | Status: DC
  Administered 2014-01-07: 10:00:00 via INTRAVENOUS

## 2014-01-07 MED ORDER — IMMUNE GLOBULIN (HUMAN) 10 GM/100ML IV SOLN
30.0000 g | Freq: Once | INTRAVENOUS | Status: AC
  Administered 2014-01-07: 30 g via INTRAVENOUS
  Filled 2014-01-07: qty 300

## 2014-01-07 MED ORDER — DIPHENHYDRAMINE HCL 25 MG PO CAPS
ORAL_CAPSULE | ORAL | Status: AC
  Filled 2014-01-07: qty 1

## 2014-01-07 NOTE — Patient Instructions (Signed)

## 2014-01-22 ENCOUNTER — Telehealth: Payer: Self-pay | Admitting: Oncology

## 2014-01-22 NOTE — Telephone Encounter (Signed)
Talked to pt and gave her appt for July , advised her to get appt calendar until october

## 2014-01-31 ENCOUNTER — Other Ambulatory Visit: Payer: Self-pay | Admitting: Internal Medicine

## 2014-02-03 ENCOUNTER — Other Ambulatory Visit: Payer: Self-pay | Admitting: Internal Medicine

## 2014-02-04 ENCOUNTER — Ambulatory Visit (HOSPITAL_BASED_OUTPATIENT_CLINIC_OR_DEPARTMENT_OTHER): Payer: Medicare Other

## 2014-02-04 VITALS — BP 119/63 | HR 93 | Temp 97.7°F | Resp 16

## 2014-02-04 DIAGNOSIS — C9110 Chronic lymphocytic leukemia of B-cell type not having achieved remission: Secondary | ICD-10-CM

## 2014-02-04 DIAGNOSIS — D801 Nonfamilial hypogammaglobulinemia: Secondary | ICD-10-CM

## 2014-02-04 MED ORDER — DIPHENHYDRAMINE HCL 25 MG PO CAPS
ORAL_CAPSULE | ORAL | Status: AC
  Filled 2014-02-04: qty 1

## 2014-02-04 MED ORDER — IMMUNE GLOBULIN (HUMAN) 10 GM/100ML IV SOLN
30.0000 g | Freq: Once | INTRAVENOUS | Status: AC
  Administered 2014-02-04: 30 g via INTRAVENOUS
  Filled 2014-02-04: qty 300

## 2014-02-04 MED ORDER — DIPHENHYDRAMINE HCL 25 MG PO CAPS
25.0000 mg | ORAL_CAPSULE | Freq: Once | ORAL | Status: AC
  Administered 2014-02-04: 25 mg via ORAL

## 2014-02-04 NOTE — Patient Instructions (Signed)

## 2014-03-04 ENCOUNTER — Ambulatory Visit (HOSPITAL_BASED_OUTPATIENT_CLINIC_OR_DEPARTMENT_OTHER): Payer: Medicare Other

## 2014-03-04 VITALS — BP 126/74 | HR 63 | Temp 97.1°F | Resp 18

## 2014-03-04 DIAGNOSIS — C9110 Chronic lymphocytic leukemia of B-cell type not having achieved remission: Secondary | ICD-10-CM

## 2014-03-04 DIAGNOSIS — D801 Nonfamilial hypogammaglobulinemia: Secondary | ICD-10-CM

## 2014-03-04 MED ORDER — SODIUM CHLORIDE 0.9 % IV SOLN
INTRAVENOUS | Status: DC
  Administered 2014-03-04: 10:00:00 via INTRAVENOUS

## 2014-03-04 MED ORDER — IMMUNE GLOBULIN (HUMAN) 10 GM/100ML IV SOLN
30.0000 g | Freq: Once | INTRAVENOUS | Status: AC
  Administered 2014-03-04: 30 g via INTRAVENOUS
  Filled 2014-03-04: qty 300

## 2014-03-04 MED ORDER — DIPHENHYDRAMINE HCL 25 MG PO CAPS
25.0000 mg | ORAL_CAPSULE | Freq: Once | ORAL | Status: AC
  Administered 2014-03-04: 25 mg via ORAL

## 2014-03-04 MED ORDER — DIPHENHYDRAMINE HCL 25 MG PO CAPS
ORAL_CAPSULE | ORAL | Status: AC
  Filled 2014-03-04: qty 1

## 2014-03-04 NOTE — Patient Instructions (Signed)

## 2014-03-12 ENCOUNTER — Ambulatory Visit (INDEPENDENT_AMBULATORY_CARE_PROVIDER_SITE_OTHER): Payer: Medicare Other | Admitting: Internal Medicine

## 2014-03-12 ENCOUNTER — Encounter: Payer: Self-pay | Admitting: Internal Medicine

## 2014-03-12 ENCOUNTER — Other Ambulatory Visit (INDEPENDENT_AMBULATORY_CARE_PROVIDER_SITE_OTHER): Payer: Medicare Other

## 2014-03-12 VITALS — BP 118/64 | HR 76 | Temp 98.3°F | Resp 16 | Ht 64.0 in | Wt 141.0 lb

## 2014-03-12 DIAGNOSIS — E538 Deficiency of other specified B group vitamins: Secondary | ICD-10-CM

## 2014-03-12 DIAGNOSIS — E785 Hyperlipidemia, unspecified: Secondary | ICD-10-CM

## 2014-03-12 DIAGNOSIS — C9110 Chronic lymphocytic leukemia of B-cell type not having achieved remission: Secondary | ICD-10-CM

## 2014-03-12 DIAGNOSIS — Z23 Encounter for immunization: Secondary | ICD-10-CM

## 2014-03-12 DIAGNOSIS — F419 Anxiety disorder, unspecified: Secondary | ICD-10-CM

## 2014-03-12 DIAGNOSIS — F411 Generalized anxiety disorder: Secondary | ICD-10-CM

## 2014-03-12 DIAGNOSIS — I10 Essential (primary) hypertension: Secondary | ICD-10-CM

## 2014-03-12 LAB — VITAMIN B12: VITAMIN B 12: 317 pg/mL (ref 211–911)

## 2014-03-12 LAB — BASIC METABOLIC PANEL
BUN: 22 mg/dL (ref 6–23)
CHLORIDE: 95 meq/L — AB (ref 96–112)
CO2: 31 mEq/L (ref 19–32)
CREATININE: 0.8 mg/dL (ref 0.4–1.2)
Calcium: 9.6 mg/dL (ref 8.4–10.5)
GFR: 71.32 mL/min (ref >60.00)
GLUCOSE: 90 mg/dL (ref 70–99)
POTASSIUM: 3.8 meq/L (ref 3.5–5.1)
Sodium: 132 mEq/L — ABNORMAL LOW (ref 135–145)

## 2014-03-12 LAB — HEPATIC FUNCTION PANEL
ALBUMIN: 4 g/dL (ref 3.5–5.2)
ALT: 16 U/L (ref 0–35)
AST: 23 U/L (ref 0–37)
Alkaline Phosphatase: 51 U/L (ref 39–117)
BILIRUBIN TOTAL: 0.8 mg/dL (ref 0.2–1.2)
Bilirubin, Direct: 0.1 mg/dL (ref 0.0–0.3)
Total Protein: 7.9 g/dL (ref 6.0–8.3)

## 2014-03-12 LAB — TSH: TSH: 1.98 u[IU]/mL (ref 0.35–4.50)

## 2014-03-12 NOTE — Progress Notes (Signed)
Subjective:    HPI  Her son died on 14-Nov-2013 in a NH - CVA. Seeing Dr Berenice Primas The patient presents for a follow-up of  chronic hypertension, chronic dyslipidemia, anxiety (mild) controlled with medicines.  BP is good home.  F/u Vit B12 def - Nascobal was $$$ She fell on Dec 7th 2014 and fractured her right upper arm. She reports the arm is healing.  She had a Life Line Screening - found to have A fib  Her 2nd son had a CVA in 2.13  BP Readings from Last 3 Encounters:  03/12/14 118/64  03/04/14 126/74  02/04/14 119/63   Wt Readings from Last 3 Encounters:  03/12/14 141 lb (63.957 kg)  12/11/13 141 lb 12 oz (64.297 kg)  12/10/13 141 lb 14.4 oz (64.365 kg)       Review of Systems  Constitutional: Positive for fatigue. Negative for fever, chills, diaphoresis, activity change, appetite change and unexpected weight change.  HENT: Negative for congestion, ear pain, facial swelling, hearing loss, mouth sores, nosebleeds, postnasal drip, rhinorrhea, sinus pressure, sneezing, sore throat, tinnitus and trouble swallowing.   Eyes: Negative for pain, discharge, redness, itching and visual disturbance.  Respiratory: Negative for cough, chest tightness, shortness of breath, wheezing and stridor.   Cardiovascular: Negative for chest pain, palpitations and leg swelling.  Gastrointestinal: Negative for nausea, diarrhea, constipation, blood in stool, abdominal distention, anal bleeding and rectal pain.  Genitourinary: Negative for dysuria, urgency, frequency, hematuria, flank pain, vaginal bleeding, vaginal discharge, difficulty urinating, genital sores and pelvic pain.  Musculoskeletal: Negative for arthralgias, back pain, gait problem, joint swelling, neck pain and neck stiffness.  Skin: Negative.  Negative for rash.  Neurological: Negative for dizziness, tremors, seizures, syncope, speech difficulty, weakness, numbness and headaches.  Hematological: Negative for adenopathy. Does not  bruise/bleed easily.  Psychiatric/Behavioral: Negative for suicidal ideas, behavioral problems, sleep disturbance, dysphoric mood and decreased concentration. The patient is nervous/anxious.        Objective:   Physical Exam  Constitutional: She appears well-developed. No distress.  obese  HENT:  Head: Normocephalic.  Right Ear: External ear normal.  Left Ear: External ear normal.  Nose: Nose normal.  Mouth/Throat: Oropharynx is clear and moist.  No halitosis  Eyes: Conjunctivae are normal. Pupils are equal, round, and reactive to light. Right eye exhibits no discharge. Left eye exhibits no discharge.  Neck: Normal range of motion. Neck supple. No JVD present. No tracheal deviation present. No thyromegaly present.  Cardiovascular: Normal rate, regular rhythm and normal heart sounds.   occ irreg beats  Pulmonary/Chest: No stridor. No respiratory distress. She has no wheezes.  Abdominal: Soft. Bowel sounds are normal. She exhibits no distension and no mass. There is no tenderness. There is no rebound and no guarding.  Musculoskeletal: She exhibits no edema and no tenderness.  Lymphadenopathy:    She has no cervical adenopathy.  Neurological: She displays normal reflexes. No cranial nerve deficit. She exhibits normal muscle tone. Coordination normal.  Skin: No rash noted. No erythema.  Psychiatric: She has a normal mood and affect. Her behavior is normal. Judgment and thought content normal.  R shoulder is tender w/ROM, restricted in ROM - a little better AKs on face  Lab Results  Component Value Date   WBC 6.7 01/07/2014   HGB 13.5 01/07/2014   HCT 38.7 01/07/2014   PLT 130* 01/07/2014   GLUCOSE 101* 08/26/2013   CHOL 187 08/26/2013   TRIG 74.0 08/26/2013   HDL 65.70 08/26/2013  LDLCALC 107* 08/26/2013   ALT 18 08/26/2013   AST 23 08/26/2013   NA 136 08/26/2013   K 3.4* 08/26/2013   CL 98 08/26/2013   CREATININE 0.8 08/26/2013   BUN 9 08/26/2013   CO2 29 08/26/2013   TSH 2.51 08/26/2013   INR 1.0  10/07/2008   HGBA1C  Value: 5.9 (NOTE)   The ADA recommends the following therapeutic goal for glycemic   control related to Hgb A1C measurement:   Goal of Therapy:   < 7.0% Hgb A1C   Reference: American Diabetes Association: Clinical Practice   Recommendations 2008, Diabetes Care,  2008, 31:(Suppl 1). 10/07/2008   MICROALBUR 0.4 07/14/2009    EKG - no A fib      Assessment & Plan:

## 2014-03-12 NOTE — Assessment & Plan Note (Signed)
Continue with current prescription therapy as reflected on the Med list.  

## 2014-03-12 NOTE — Assessment & Plan Note (Signed)
F/u w/Dr Benay Spice Chronic

## 2014-03-12 NOTE — Progress Notes (Signed)
Pre visit review using our clinic review tool, if applicable. No additional management support is needed unless otherwise documented below in the visit note. 

## 2014-03-19 ENCOUNTER — Telehealth: Payer: Self-pay | Admitting: Geriatric Medicine

## 2014-03-19 NOTE — Telephone Encounter (Signed)
Patient aware of normal labs

## 2014-03-19 NOTE — Telephone Encounter (Signed)
Message copied by Alger Memos on Thu Mar 19, 2014  3:51 PM ------      Message from: Cassandria Anger      Created: Thu Mar 12, 2014  9:01 PM       Erline Levine, please, inform patient that all labs are stable      Thx       ------

## 2014-04-01 ENCOUNTER — Ambulatory Visit (HOSPITAL_BASED_OUTPATIENT_CLINIC_OR_DEPARTMENT_OTHER): Payer: Medicare Other

## 2014-04-01 ENCOUNTER — Telehealth: Payer: Self-pay | Admitting: *Deleted

## 2014-04-01 ENCOUNTER — Telehealth: Payer: Self-pay | Admitting: Oncology

## 2014-04-01 ENCOUNTER — Ambulatory Visit (HOSPITAL_BASED_OUTPATIENT_CLINIC_OR_DEPARTMENT_OTHER): Payer: Medicare Other | Admitting: Oncology

## 2014-04-01 ENCOUNTER — Other Ambulatory Visit: Payer: Self-pay | Admitting: Oncology

## 2014-04-01 VITALS — BP 124/60 | HR 65 | Temp 98.1°F | Resp 17 | Ht 64.0 in | Wt 141.0 lb

## 2014-04-01 VITALS — BP 122/79 | HR 59 | Temp 98.6°F | Resp 18

## 2014-04-01 DIAGNOSIS — D801 Nonfamilial hypogammaglobulinemia: Secondary | ICD-10-CM

## 2014-04-01 DIAGNOSIS — Z23 Encounter for immunization: Secondary | ICD-10-CM

## 2014-04-01 DIAGNOSIS — C9110 Chronic lymphocytic leukemia of B-cell type not having achieved remission: Secondary | ICD-10-CM

## 2014-04-01 MED ORDER — DIPHENHYDRAMINE HCL 25 MG PO CAPS
ORAL_CAPSULE | ORAL | Status: AC
  Filled 2014-04-01: qty 1

## 2014-04-01 MED ORDER — IMMUNE GLOBULIN (HUMAN) 10 GM/100ML IV SOLN
30.0000 g | Freq: Once | INTRAVENOUS | Status: AC
  Administered 2014-04-01: 30 g via INTRAVENOUS
  Filled 2014-04-01: qty 300

## 2014-04-01 MED ORDER — PNEUMOCOCCAL 13-VAL CONJ VACC IM SUSP
0.5000 mL | INTRAMUSCULAR | Status: AC
Start: 2014-04-02 — End: 2014-04-01
  Administered 2014-04-01: 0.5 mL via INTRAMUSCULAR
  Filled 2014-04-01: qty 0.5

## 2014-04-01 MED ORDER — DIPHENHYDRAMINE HCL 25 MG PO CAPS
25.0000 mg | ORAL_CAPSULE | Freq: Once | ORAL | Status: AC
  Administered 2014-04-01: 25 mg via ORAL

## 2014-04-01 NOTE — Telephone Encounter (Signed)
Pt confirmed labs/ov per 09/09 POF, sent msg to add treatment, gave pt AVS......KJ

## 2014-04-01 NOTE — Progress Notes (Signed)
  [place] OFFICE PROGRESS NOTE   Diagnosis: CLL  INTERVAL HISTORY:   She returns as scheduled. She continues monthly IVIG. She has received an influenza vaccine this year. No recent infection. No fever, sweats, or palpable lymph nodes. Good appetite. She is exercising.  Objective:  Vital signs in last 24 hours:  Blood pressure 124/60, pulse 65, temperature 98.1 F (36.7 C), temperature source Oral, resp. rate 17, height 5\' 4"  (1.626 m), weight 141 lb (63.957 kg), SpO2 98.00%.    HEENT: Neck without mass Lymphatics: No cervical, supraclavicular, axillary, or inguinal nodes Resp: Lungs clear bilaterally Cardio: Regular rate and rhythm GI: No hepatosplenomegaly Vascular: No leg edema   Lab Results:  Lab Results  Component Value Date   WBC 6.7 01/07/2014   HGB 13.5 01/07/2014   HCT 38.7 01/07/2014   MCV 89.2 01/07/2014   PLT 130* 01/07/2014   NEUTROABS 3.4 01/07/2014    Medications: I have reviewed the patient's current medications.  Assessment/Plan: 1. Chronic lymphocytic leukemia, asymptomatic. 2. History of hypogammaglobulinemia with recurrent infections. She continues monthly IVIG replacement therapy. 3. History of recurrent urinary tract infections. 4. History of mild thrombocytopenia secondary to CLL.    Disposition:  She appears stable. The plan is to continue monthly IVIG. She had a local reaction to the pneumococcal vaccine in 2013. I will have pharmacy to check into cross reactivity with the 13 valent vaccine.  Ms. Scharrer will return for an office visit in 3 months.  Betsy Coder, MD  04/01/2014  9:32 AM

## 2014-04-01 NOTE — Telephone Encounter (Signed)
Per staff message and POF I have scheduled appts. Advised scheduler of appts. JMW  

## 2014-04-01 NOTE — Patient Instructions (Signed)

## 2014-04-07 ENCOUNTER — Other Ambulatory Visit: Payer: Self-pay | Admitting: Internal Medicine

## 2014-04-22 ENCOUNTER — Telehealth: Payer: Self-pay | Admitting: Internal Medicine

## 2014-04-22 NOTE — Telephone Encounter (Signed)
Patient Information:  Caller Name: Dorthy  Phone: [phone]  Patient: [name], [name]  Gender: Female  DOB: 02/09/1926  Age: 80 Years  PCP: Plotnikov, Alex (Adults only)  Office Follow Up:  Does the office need to follow up with this patient?: Yes  Instructions For The Office: Please call patient and advise if can take additional Omeprazole 40mg  as one daily has not helped indigestion after eating spicy/tomatoe meal 1 week ago.  RN Note:  Triage: Heartburn. Disp: see provider within 24hrs due to current treatment not working. Wants to know if can take additional Omeprazole 40mg .  Symptoms  Reason For Call & Symptoms: States has had indigestion "for 1 week" after eating eggplant parmesan. Wants to know if can take additional Omeprazole 40mg . Took usual dose at 0800.  Reviewed Health History In EMR: Yes  Reviewed Medications In EMR: Yes  Reviewed Allergies In EMR: Yes  Reviewed Surgeries / Procedures: Yes  Date of Onset of Symptoms: 04/15/2014  Treatments Tried: Tums  Treatments Tried Worked: No  Guideline(s) Used:  No Protocol Available - Sick Adult  Disposition Per Guideline:   Callback by PCP Today  Reason For Disposition Reached:   Nursing judgment  Advice Given:  N/A  Patient Will Follow Care Advice:  YES

## 2014-04-22 NOTE — Telephone Encounter (Signed)
MD out of office. Pls advise...[name]

## 2014-04-22 NOTE — Telephone Encounter (Signed)
Take the protein pump inhibitor 30 minutes before breakfast and 30 minutes before the evening meal ; OVINB ER if having chest pain, palpitations,dyspnea

## 2014-04-23 NOTE — Telephone Encounter (Signed)
Notified pt with md response.../lmb 

## 2014-04-29 ENCOUNTER — Ambulatory Visit (HOSPITAL_BASED_OUTPATIENT_CLINIC_OR_DEPARTMENT_OTHER): Payer: Medicare Other

## 2014-04-29 VITALS — BP 125/51 | HR 61 | Temp 98.5°F | Resp 18

## 2014-04-29 DIAGNOSIS — D801 Nonfamilial hypogammaglobulinemia: Secondary | ICD-10-CM

## 2014-04-29 DIAGNOSIS — C9110 Chronic lymphocytic leukemia of B-cell type not having achieved remission: Secondary | ICD-10-CM

## 2014-04-29 MED ORDER — DIPHENHYDRAMINE HCL 25 MG PO CAPS
25.0000 mg | ORAL_CAPSULE | Freq: Once | ORAL | Status: AC
  Administered 2014-04-29: 25 mg via ORAL

## 2014-04-29 MED ORDER — IMMUNE GLOBULIN (HUMAN) 10 GM/100ML IV SOLN
30.0000 g | Freq: Once | INTRAVENOUS | Status: AC
  Administered 2014-04-29: 30 g via INTRAVENOUS
  Filled 2014-04-29: qty 300

## 2014-04-29 MED ORDER — DIPHENHYDRAMINE HCL 25 MG PO CAPS
ORAL_CAPSULE | ORAL | Status: AC
  Filled 2014-04-29: qty 1

## 2014-04-29 MED ORDER — DEXTROSE 5 % IV SOLN
Freq: Once | INTRAVENOUS | Status: AC
  Administered 2014-04-29: 10:00:00 via INTRAVENOUS

## 2014-04-29 NOTE — Patient Instructions (Signed)

## 2014-04-29 NOTE — Progress Notes (Addendum)
1341: Monitored patient 30 minutes post infusion. Vital signs stable. Pt in no distress and with no complaints. Pt ambulatory without difficulty upon discharge.

## 2014-05-07 ENCOUNTER — Other Ambulatory Visit: Payer: Self-pay | Admitting: Internal Medicine

## 2014-05-14 ENCOUNTER — Other Ambulatory Visit: Payer: Self-pay | Admitting: Internal Medicine

## 2014-05-26 ENCOUNTER — Other Ambulatory Visit: Payer: Self-pay | Admitting: Internal Medicine

## 2014-05-27 ENCOUNTER — Ambulatory Visit (HOSPITAL_BASED_OUTPATIENT_CLINIC_OR_DEPARTMENT_OTHER): Payer: Medicare Other

## 2014-05-27 VITALS — BP 129/60 | HR 76 | Temp 97.9°F | Resp 18

## 2014-05-27 DIAGNOSIS — C9110 Chronic lymphocytic leukemia of B-cell type not having achieved remission: Secondary | ICD-10-CM

## 2014-05-27 DIAGNOSIS — D801 Nonfamilial hypogammaglobulinemia: Secondary | ICD-10-CM

## 2014-05-27 MED ORDER — IMMUNE GLOBULIN (HUMAN) 10 GM/100ML IV SOLN
30.0000 g | Freq: Once | INTRAVENOUS | Status: AC
  Administered 2014-05-27: 30 g via INTRAVENOUS
  Filled 2014-05-27: qty 300

## 2014-05-27 MED ORDER — DIPHENHYDRAMINE HCL 25 MG PO CAPS
ORAL_CAPSULE | ORAL | Status: AC
  Filled 2014-05-27: qty 1

## 2014-05-27 MED ORDER — DIPHENHYDRAMINE HCL 25 MG PO CAPS
25.0000 mg | ORAL_CAPSULE | Freq: Once | ORAL | Status: AC
  Administered 2014-05-27: 25 mg via ORAL

## 2014-05-27 MED ORDER — SODIUM CHLORIDE 0.9 % IV SOLN
INTRAVENOUS | Status: DC
  Administered 2014-05-27: 10:00:00 via INTRAVENOUS

## 2014-05-27 NOTE — Patient Instructions (Signed)

## 2014-06-24 ENCOUNTER — Ambulatory Visit (HOSPITAL_BASED_OUTPATIENT_CLINIC_OR_DEPARTMENT_OTHER): Payer: Medicare Other | Admitting: Nurse Practitioner

## 2014-06-24 ENCOUNTER — Telehealth: Payer: Self-pay | Admitting: Nurse Practitioner

## 2014-06-24 ENCOUNTER — Other Ambulatory Visit (HOSPITAL_BASED_OUTPATIENT_CLINIC_OR_DEPARTMENT_OTHER): Payer: Medicare Other

## 2014-06-24 ENCOUNTER — Ambulatory Visit (HOSPITAL_BASED_OUTPATIENT_CLINIC_OR_DEPARTMENT_OTHER): Payer: Medicare Other

## 2014-06-24 ENCOUNTER — Telehealth: Payer: Self-pay | Admitting: *Deleted

## 2014-06-24 VITALS — BP 131/65 | HR 68 | Temp 98.2°F | Resp 18 | Ht 64.0 in | Wt 143.1 lb

## 2014-06-24 DIAGNOSIS — C9110 Chronic lymphocytic leukemia of B-cell type not having achieved remission: Secondary | ICD-10-CM

## 2014-06-24 DIAGNOSIS — D696 Thrombocytopenia, unspecified: Secondary | ICD-10-CM

## 2014-06-24 DIAGNOSIS — D801 Nonfamilial hypogammaglobulinemia: Secondary | ICD-10-CM

## 2014-06-24 LAB — CBC WITH DIFFERENTIAL/PLATELET
BASO%: 0.5 % (ref 0.0–2.0)
Basophils Absolute: 0 10*3/uL (ref 0.0–0.1)
EOS%: 1.2 % (ref 0.0–7.0)
Eosinophils Absolute: 0.1 10*3/uL (ref 0.0–0.5)
HCT: 39.4 % (ref 34.8–46.6)
HEMOGLOBIN: 13.6 g/dL (ref 11.6–15.9)
LYMPH#: 2.5 10*3/uL (ref 0.9–3.3)
LYMPH%: 42.1 % (ref 14.0–49.7)
MCH: 31.6 pg (ref 25.1–34.0)
MCHC: 34.5 g/dL (ref 31.5–36.0)
MCV: 91.4 fL (ref 79.5–101.0)
MONO#: 0.4 10*3/uL (ref 0.1–0.9)
MONO%: 7.5 % (ref 0.0–14.0)
NEUT#: 2.9 10*3/uL (ref 1.5–6.5)
NEUT%: 48.7 % (ref 38.4–76.8)
Platelets: 135 10*3/uL — ABNORMAL LOW (ref 145–400)
RBC: 4.31 10*6/uL (ref 3.70–5.45)
RDW: 13.8 % (ref 11.2–14.5)
WBC: 5.9 10*3/uL (ref 3.9–10.3)
nRBC: 0 % (ref 0–0)

## 2014-06-24 MED ORDER — IMMUNE GLOBULIN (HUMAN) 10 GM/100ML IV SOLN
30.0000 g | Freq: Once | INTRAVENOUS | Status: AC
  Administered 2014-06-24: 30 g via INTRAVENOUS
  Filled 2014-06-24: qty 300

## 2014-06-24 MED ORDER — DIPHENHYDRAMINE HCL 25 MG PO CAPS
25.0000 mg | ORAL_CAPSULE | Freq: Once | ORAL | Status: AC
  Administered 2014-06-24: 25 mg via ORAL

## 2014-06-24 MED ORDER — DIPHENHYDRAMINE HCL 25 MG PO CAPS
ORAL_CAPSULE | ORAL | Status: AC
  Filled 2014-06-24: qty 1

## 2014-06-24 NOTE — Telephone Encounter (Signed)
Per staff message and POF I have scheduled appts. Advised scheduler of appts. JMW  

## 2014-06-24 NOTE — Patient Instructions (Signed)

## 2014-06-24 NOTE — Progress Notes (Signed)
  [place] OFFICE PROGRESS NOTE   Diagnosis:  CLL  INTERVAL HISTORY:   [name] returns as scheduled. She continues monthly IVIG. No interim illnesses or infections. She denies any fevers or sweats. She has a good appetite. She reports her weight is stable. She received the pneumococcal 13 valent vaccine in September of this year. She denies any signs of a reaction. For the past 3-4 years she notes that her left arm feels "cold inside".   Objective:  Vital signs in last 24 hours:  Blood pressure 131/65, pulse 68, temperature 98.2 F (36.8 C), temperature source Oral, resp. rate 18, height 5\' 4"  (1.626 m), weight 143 lb 1.6 oz (64.91 kg).    HEENT: no thrush or ulcers. Lymphatics: no palpable cervical, supraclavicular, axillary or inguinal lymph nodes. Resp: lungs clear bilaterally. Cardio: regular rate and rhythm. GI: abdomen soft and nontender. No organomegaly. Vascular: no extremity edema. The left arm is warm to touch. Radial pulse 2+. Good capillary refill left nailbeds. Neuro: left arm strength 5 over 5.    Lab Results:  Lab Results  Component Value Date   WBC 5.9 06/24/2014   HGB 13.6 06/24/2014   HCT 39.4 06/24/2014   MCV 91.4 06/24/2014   PLT 135* 06/24/2014   NEUTROABS 2.9 06/24/2014    Imaging:  No results found.  Medications: I have reviewed the patient's current medications.  Assessment/Plan: 1. Chronic lymphocytic leukemia, asymptomatic. 2. History of hypogammaglobulinemia with recurrent infections. She continues monthly IVIG replacement therapy. 3. History of recurrent urinary tract infections. 4. History of mild thrombocytopenia secondary to CLL.   Disposition: Ms. Rodocker appears stable. Plan to continue monthly IVIG. She is up to date on the influenza and pneumococcal vaccines.  She will discuss the cold sensation involving the left arm with Dr. Alain Marion.  She will return for a followup visit here in 3 months.    Ned Card ANP/GNP-BC   06/24/2014  9:25 AM

## 2014-06-24 NOTE — Telephone Encounter (Signed)
Gave avs & cal for Dec/March. Sent mess to sch tx.

## 2014-07-15 ENCOUNTER — Ambulatory Visit: Payer: Medicare Other

## 2014-07-22 ENCOUNTER — Ambulatory Visit (HOSPITAL_BASED_OUTPATIENT_CLINIC_OR_DEPARTMENT_OTHER): Payer: Medicare Other

## 2014-07-22 VITALS — BP 120/63 | HR 78 | Temp 98.6°F | Resp 20

## 2014-07-22 DIAGNOSIS — C9110 Chronic lymphocytic leukemia of B-cell type not having achieved remission: Secondary | ICD-10-CM

## 2014-07-22 MED ORDER — SODIUM CHLORIDE 0.9 % IV SOLN
INTRAVENOUS | Status: DC
  Administered 2014-07-22: 10:00:00 via INTRAVENOUS

## 2014-07-22 MED ORDER — DIPHENHYDRAMINE HCL 25 MG PO CAPS
ORAL_CAPSULE | ORAL | Status: AC
Start: 2014-07-22 — End: 2014-07-22
  Filled 2014-07-22: qty 1

## 2014-07-22 MED ORDER — DIPHENHYDRAMINE HCL 25 MG PO CAPS
25.0000 mg | ORAL_CAPSULE | Freq: Once | ORAL | Status: AC
  Administered 2014-07-22: 25 mg via ORAL

## 2014-07-22 MED ORDER — IMMUNE GLOBULIN (HUMAN) 10 GM/100ML IV SOLN
30.0000 g | Freq: Once | INTRAVENOUS | Status: AC
  Administered 2014-07-22: 30 g via INTRAVENOUS
  Filled 2014-07-22: qty 300

## 2014-07-22 NOTE — Patient Instructions (Signed)

## 2014-08-03 ENCOUNTER — Other Ambulatory Visit: Payer: Self-pay | Admitting: Internal Medicine

## 2014-08-19 ENCOUNTER — Ambulatory Visit (HOSPITAL_BASED_OUTPATIENT_CLINIC_OR_DEPARTMENT_OTHER): Payer: Medicare Other

## 2014-08-19 VITALS — BP 122/57 | HR 58 | Temp 98.2°F | Resp 18

## 2014-08-19 DIAGNOSIS — C9110 Chronic lymphocytic leukemia of B-cell type not having achieved remission: Secondary | ICD-10-CM

## 2014-08-19 DIAGNOSIS — D801 Nonfamilial hypogammaglobulinemia: Secondary | ICD-10-CM

## 2014-08-19 MED ORDER — IMMUNE GLOBULIN (HUMAN) 10 GM/100ML IV SOLN
30.0000 g | Freq: Once | INTRAVENOUS | Status: AC
  Administered 2014-08-19: 30 g via INTRAVENOUS
  Filled 2014-08-19: qty 300

## 2014-08-19 MED ORDER — DIPHENHYDRAMINE HCL 25 MG PO CAPS
25.0000 mg | ORAL_CAPSULE | Freq: Once | ORAL | Status: AC
  Administered 2014-08-19: 25 mg via ORAL

## 2014-08-19 NOTE — Patient Instructions (Signed)

## 2014-09-14 ENCOUNTER — Other Ambulatory Visit: Payer: Self-pay | Admitting: Internal Medicine

## 2014-09-14 ENCOUNTER — Telehealth: Payer: Self-pay | Admitting: Pharmacist

## 2014-09-14 NOTE — Telephone Encounter (Signed)
Pt called [facility] stating she thinks she is getting a "bad bladder infection." I advised she call Dr. Judeen Hammans office for appt for UA. Due for IVIG this week. Kennith Center, Pharm.D., CPP 09/14/2014@4 :46 PM

## 2014-09-15 ENCOUNTER — Encounter: Payer: Self-pay | Admitting: Family

## 2014-09-15 ENCOUNTER — Ambulatory Visit (INDEPENDENT_AMBULATORY_CARE_PROVIDER_SITE_OTHER): Payer: Medicare Other | Admitting: Family

## 2014-09-15 ENCOUNTER — Other Ambulatory Visit: Payer: Medicare Other

## 2014-09-15 VITALS — BP 150/78 | HR 72 | Temp 98.1°F | Resp 18 | Ht 64.0 in | Wt 143.0 lb

## 2014-09-15 DIAGNOSIS — R300 Dysuria: Secondary | ICD-10-CM

## 2014-09-15 DIAGNOSIS — N3090 Cystitis, unspecified without hematuria: Secondary | ICD-10-CM

## 2014-09-15 LAB — POCT URINALYSIS DIPSTICK
BILIRUBIN UA: NEGATIVE
Glucose, UA: NEGATIVE
Ketones, UA: NEGATIVE
Nitrite, UA: POSITIVE
PH UA: 6.5
SPEC GRAV UA: 1.01
Urobilinogen, UA: NEGATIVE

## 2014-09-15 MED ORDER — SULFAMETHOXAZOLE-TRIMETHOPRIM 800-160 MG PO TABS: 1.0000 | ORAL_TABLET | Freq: Two times a day (BID) | ORAL | Status: DC

## 2014-09-15 MED ORDER — PHENAZOPYRIDINE HCL 95 MG PO TABS: 95.0000 mg | ORAL_TABLET | Freq: Three times a day (TID) | ORAL | Status: DC | PRN

## 2014-09-15 NOTE — Progress Notes (Signed)
Subjective:    Patient ID: [name], female    DOB: Jul 26, 1925, 81 y.o.   MRN: [id number]  Chief Complaint  Patient presents with  . Abdominal Pain    has lower abdominal pain, dysuria and urinary frequency, x2 days    HPI:  [name] is a 81 y.o. female who presents today for an acute visit.  This is a new problem. Associated symptoms of lower abdominal pain, dysuria, mild left sided flank and urinary frequency has been going on for 2 days. Has tried to take Cystex which has provided some relief, but not resolved symptoms. Denies any antibiotic use recently.   Allergies  Allergen Reactions  . Pneumococcal Vaccines Swelling and Rash    Pneumococcal Vaccine-23 valent  . Clarithromycin     REACTION: sore mouth  . Oxycodone-Aspirin     REACTION: horrible nightmares  . Propoxyphene N-Acetaminophen     REACTION: couldn't wake her up    Current Outpatient Prescriptions on File Prior to Visit  Medication Sig Dispense Refill  . acetaminophen (TYLENOL) 500 MG tablet Take 500 mg by mouth 3 (three) times daily.      Marland Kitchen amLODipine (NORVASC) 5 MG tablet TAKE 1 TABLET (5 MG TOTAL) BY MOUTH DAILY. 30 tablet 5  . amLODipine (NORVASC) 5 MG tablet TAKE 1 TABLET (5 MG TOTAL) BY MOUTH DAILY. 30 tablet 1  . aspirin 81 MG EC tablet Take 81 mg by mouth daily.      . B-D 3CC LUER-LOK SYR 25GX1" 25G X 1" 3 ML MISC USE TO ADMINISTER B12 INJECTIONS 10 each 2  . B-D INS SYR ULTRAFINE 1CC/30G 30G X 1/2" 1 ML MISC USE TO ADMINISTER B12 INJECTIONS 10 each 1  . Cholecalciferol (EQL VITAMIN D3) 1000 UNITS tablet Take 1,000 Units by mouth daily.      . cyanocobalamin (,VITAMIN B-12,) 1000 MCG/ML injection ADMINISTER 1 CC SQ EVERY 4 WEEKS 10 mL 0  . cyanocobalamin (,VITAMIN B-12,) 1000 MCG/ML injection ADMINISTER 1 CC SUBQ EVERY 4 WEEKS AS DIRECTED 10 mL 2  . folic acid (FOLVITE) 1 MG tablet Take 1 mg by mouth daily. For restless legs    . gabapentin (NEURONTIN) 300 MG capsule TAKE ONE CAPSULE BY  MOUTH EVERY DAY 90 capsule 2  . Ginseng 250 MG CAPS Take 1 capsule by mouth 2 (two) times daily.      . Grape Seed Extract 100 MG CAPS Take 1 capsule by mouth 2 (two) times daily.      . hydrochlorothiazide (MICROZIDE) 12.5 MG capsule TAKE ONE CAPSULE BY MOUTH EVERY DAY FOR BLOOD PRESSURE 90 capsule 3  . Insulin Syringe-Needle U-100 (B-D INS SYRINGE 0.5CC/30GX1/2") 30G X 1/2" 0.5 ML MISC by Does not apply route. To administer B12 injections     . KLOR-CON M10 10 MEQ tablet TAKE 1 TABLET EVERY DAY 30 tablet 5  . Lactobacillus (ACIDOPHILUS PO) Take by mouth daily.    Marland Kitchen latanoprost (XALATAN) 0.005 % ophthalmic solution     . loratadine (CLARITIN) 10 MG tablet TAKE 1 TABLET BY MOUTH EVERY DAY 30 tablet 5  . omeprazole (PRILOSEC) 40 MG capsule TAKE 1 CAPSULE (40 MG TOTAL) BY MOUTH TWICE A DAY    . omeprazole (PRILOSEC) 40 MG capsule TAKE 1 CAPSULE (40 MG TOTAL) BY MOUTH DAILY. 90 capsule 2  . potassium chloride (KLOR-CON M10) 10 MEQ tablet TAKE 1 TABLET EVERY OTHER DAY    . simvastatin (ZOCOR) 20 MG tablet TAKE 1 TABLET (20 MG  TOTAL) BY MOUTH EVERY OTHER DAY    . travoprost, benzalkonium, (TRAVATAN) 0.004 % ophthalmic solution 1 drop at bedtime.       No current facility-administered medications on file prior to visit.    Review of Systems  Constitutional: Negative for fever and chills.  Genitourinary: Positive for dysuria, frequency, hematuria and flank pain.      Objective:    BP 150/78 mmHg  Pulse 72  Temp(Src) 98.1 F (36.7 C) (Oral)  Resp 18  Ht 5\' 4"  (1.626 m)  Wt 143 lb (64.864 kg)  BMI 24.53 kg/m2  SpO2 99% Nursing note and vital signs reviewed.  Physical Exam  Constitutional: She is oriented to person, place, and time. She appears well-developed and well-nourished. No distress.  Cardiovascular: Normal rate, regular rhythm, normal heart sounds and intact distal pulses.   Pulmonary/Chest: Effort normal and breath sounds normal.  Abdominal: There is no CVA tenderness.    Neurological: She is alert and oriented to person, place, and time.  Skin: Skin is warm and dry.  Psychiatric: She has a normal mood and affect. Her behavior is normal. Judgment and thought content normal.       Assessment & Plan:

## 2014-09-15 NOTE — Assessment & Plan Note (Signed)
In office urinalysis positive for leukocytes and nitrites. Symptoms and exam consistent with bacterial cystitis. Start Bactrim 3 days. Start Pyridium as needed for dysuria. Follow-up if symptoms worsen or fail to improve.

## 2014-09-15 NOTE — Progress Notes (Signed)
Pre visit review using our clinic review tool, if applicable. No additional management support is needed unless otherwise documented below in the visit note. 

## 2014-09-15 NOTE — Patient Instructions (Signed)
Thank you for choosing [facility].  Summary/Instructions:  Your prescription(s) have been submitted to your pharmacy or been printed and provided for you. Please take as directed and contact our office if you believe you are having problem(s) with the medication(s) or have any questions.  If your symptoms worsen or fail to improve, please contact our office for further instruction, or in case of emergency go directly to the emergency room at the closest medical facility.   Urinary Tract Infection Urinary tract infections (UTIs) can develop anywhere along your urinary tract. Your urinary tract is your body's drainage system for removing wastes and extra water. Your urinary tract includes two kidneys, two ureters, a bladder, and a urethra. Your kidneys are a pair of bean-shaped organs. Each kidney is about the size of your fist. They are located below your ribs, one on each side of your spine. CAUSES Infections are caused by microbes, which are microscopic organisms, including fungi, viruses, and bacteria. These organisms are so small that they can only be seen through a microscope. Bacteria are the microbes that most commonly cause UTIs. SYMPTOMS  Symptoms of UTIs may vary by age and gender of the patient and by the location of the infection. Symptoms in young women typically include a frequent and intense urge to urinate and a painful, burning feeling in the bladder or urethra during urination. Older women and men are more likely to be tired, shaky, and weak and have muscle aches and abdominal pain. A fever may mean the infection is in your kidneys. Other symptoms of a kidney infection include pain in your back or sides below the ribs, nausea, and vomiting. DIAGNOSIS To diagnose a UTI, your caregiver will ask you about your symptoms. Your caregiver also will ask to provide a urine sample. The urine sample will be tested for bacteria and white blood cells. White blood cells are made by your  body to help fight infection. TREATMENT  Typically, UTIs can be treated with medication. Because most UTIs are caused by a bacterial infection, they usually can be treated with the use of antibiotics. The choice of antibiotic and length of treatment depend on your symptoms and the type of bacteria causing your infection. HOME CARE INSTRUCTIONS  If you were prescribed antibiotics, take them exactly as your caregiver instructs you. Finish the medication even if you feel better after you have only taken some of the medication.  Drink enough water and fluids to keep your urine clear or pale yellow.  Avoid caffeine, tea, and carbonated beverages. They tend to irritate your bladder.  Empty your bladder often. Avoid holding urine for long periods of time.  Empty your bladder before and after sexual intercourse.  After a bowel movement, women should cleanse from front to back. Use each tissue only once. SEEK MEDICAL CARE IF:   You have back pain.  You develop a fever.  Your symptoms do not begin to resolve within 3 days. SEEK IMMEDIATE MEDICAL CARE IF:   You have severe back pain or lower abdominal pain.  You develop chills.  You have nausea or vomiting.  You have continued burning or discomfort with urination. MAKE SURE YOU:   Understand these instructions.  Will watch your condition.  Will get help right away if you are not doing well or get worse. Document Released: 04/19/2005 Document Revised: 01/09/2012 Document Reviewed: 08/18/2011 ExitCare Patient Information 2015 [employer]. This information is not intended to replace advice given to you by your health care provider.   Make sure you discuss any questions you have with your health care provider.   

## 2014-09-16 ENCOUNTER — Ambulatory Visit (HOSPITAL_BASED_OUTPATIENT_CLINIC_OR_DEPARTMENT_OTHER): Payer: Medicare Other

## 2014-09-16 VITALS — BP 122/60 | HR 69 | Temp 98.5°F | Resp 16

## 2014-09-16 DIAGNOSIS — D801 Nonfamilial hypogammaglobulinemia: Secondary | ICD-10-CM

## 2014-09-16 DIAGNOSIS — C9110 Chronic lymphocytic leukemia of B-cell type not having achieved remission: Secondary | ICD-10-CM

## 2014-09-16 MED ORDER — DIPHENHYDRAMINE HCL 25 MG PO CAPS
ORAL_CAPSULE | ORAL | Status: AC
  Filled 2014-09-16: qty 1

## 2014-09-16 MED ORDER — IMMUNE GLOBULIN (HUMAN) 10 GM/100ML IV SOLN
30.0000 g | Freq: Once | INTRAVENOUS | Status: AC
  Administered 2014-09-16: 30 g via INTRAVENOUS
  Filled 2014-09-16: qty 300

## 2014-09-16 MED ORDER — DIPHENHYDRAMINE HCL 25 MG PO CAPS
25.0000 mg | ORAL_CAPSULE | Freq: Once | ORAL | Status: AC
  Administered 2014-09-16: 25 mg via ORAL

## 2014-09-16 NOTE — Patient Instructions (Signed)

## 2014-09-17 LAB — URINE CULTURE: Colony Count: >=100000

## 2014-09-19 ENCOUNTER — Telehealth: Payer: Self-pay | Admitting: Family

## 2014-09-19 NOTE — Telephone Encounter (Signed)
Please inform the patient that her urine culture showed she did have a urinary tract infection which was susceptable to the antibiotic that was prescribed. Please let us know if she has note experienced resolution.

## 2014-09-21 ENCOUNTER — Other Ambulatory Visit: Payer: Self-pay | Admitting: Internal Medicine

## 2014-09-21 MED ORDER — CIPROFLOXACIN HCL 250 MG PO TABS: 250.0000 mg | ORAL_TABLET | Freq: Two times a day (BID) | ORAL | Status: DC

## 2014-09-21 NOTE — Telephone Encounter (Signed)
Pt stated that she was only given an antibiotic for 3 days does she need something else called in?

## 2014-09-21 NOTE — Telephone Encounter (Signed)
New prescription for Cipro has been sent.

## 2014-09-22 ENCOUNTER — Telehealth: Payer: Self-pay | Admitting: Oncology

## 2014-09-22 ENCOUNTER — Ambulatory Visit (HOSPITAL_BASED_OUTPATIENT_CLINIC_OR_DEPARTMENT_OTHER): Payer: Medicare Other | Admitting: Oncology

## 2014-09-22 ENCOUNTER — Other Ambulatory Visit (HOSPITAL_BASED_OUTPATIENT_CLINIC_OR_DEPARTMENT_OTHER): Payer: Medicare Other

## 2014-09-22 VITALS — BP 138/66 | HR 67 | Temp 98.4°F | Resp 18 | Ht 64.0 in | Wt 140.9 lb

## 2014-09-22 DIAGNOSIS — D6959 Other secondary thrombocytopenia: Secondary | ICD-10-CM

## 2014-09-22 DIAGNOSIS — C9110 Chronic lymphocytic leukemia of B-cell type not having achieved remission: Secondary | ICD-10-CM

## 2014-09-22 DIAGNOSIS — D801 Nonfamilial hypogammaglobulinemia: Secondary | ICD-10-CM

## 2014-09-22 LAB — CBC WITH DIFFERENTIAL/PLATELET
BASO%: 0.6 % (ref 0.0–2.0)
Basophils Absolute: 0 10*3/uL (ref 0.0–0.1)
EOS%: 2.2 % (ref 0.0–7.0)
Eosinophils Absolute: 0.1 10*3/uL (ref 0.0–0.5)
HCT: 40.4 % (ref 34.8–46.6)
HGB: 13.4 g/dL (ref 11.6–15.9)
LYMPH#: 2.3 10*3/uL (ref 0.9–3.3)
LYMPH%: 47.4 % (ref 14.0–49.7)
MCH: 31 pg (ref 25.1–34.0)
MCHC: 33.1 g/dL (ref 31.5–36.0)
MCV: 93.8 fL (ref 79.5–101.0)
MONO#: 0.4 10*3/uL (ref 0.1–0.9)
MONO%: 8.1 % (ref 0.0–14.0)
NEUT#: 2.1 10*3/uL (ref 1.5–6.5)
NEUT%: 41.7 % (ref 38.4–76.8)
PLATELETS: 169 10*3/uL (ref 145–400)
RBC: 4.31 10*6/uL (ref 3.70–5.45)
RDW: 14 % (ref 11.2–14.5)
WBC: 4.9 10*3/uL (ref 3.9–10.3)

## 2014-09-22 NOTE — Progress Notes (Signed)
  [facility] OFFICE PROGRESS NOTE   Diagnosis: CLL  INTERVAL HISTORY:   She returns as scheduled. She continues monthly IVIG. She tolerates the IVIG well. She is currently being treated for a urinary tract infection. No other infections. No fever, night sweats, or anorexia.  Objective:  Vital signs in last 24 hours:  Blood pressure 138/66, pulse 67, temperature 98.4 F (36.9 C), temperature source Oral, resp. rate 18, height 5\' 4"  (1.626 m), weight 140 lb 14.4 oz (63.912 kg).    HEENT: Neck without mass Lymphatics: No cervical, supraclavicular, axillary, or inguinal nodes Resp: Lungs clear bilaterally Cardio: Regular rate and rhythm GI: No hepatosplenomegaly Vascular: No leg edema   Lab Results:  Lab Results  Component Value Date   WBC 4.9 09/22/2014   HGB 13.4 09/22/2014   HCT 40.4 09/22/2014   MCV 93.8 09/22/2014   PLT 169 09/22/2014   NEUTROABS 2.1 09/22/2014    Medications: I have reviewed the patient's current medications.  Assessment/Plan: 1. Chronic lymphocytic leukemia, asymptomatic. 2. History of hypogammaglobulinemia with recurrent infections. She continues monthly IVIG replacement therapy. 3. History of recurrent urinary tract infections. Escherichia coli urinary tract infection 09/15/2014 4. History of mild thrombocytopenia secondary to CLL.  Disposition:  Ms. Deiter appears stable. She will continue monthly IVIG. She will be scheduled for an office visit in 4 months.  Betsy Coder, MD  09/22/2014  9:35 AM

## 2014-09-22 NOTE — Telephone Encounter (Signed)
gv and printed appt sched and avs for pt for March thru July....sed added tx.

## 2014-09-23 ENCOUNTER — Other Ambulatory Visit: Payer: Medicare Other

## 2014-09-23 ENCOUNTER — Ambulatory Visit: Payer: Medicare Other | Admitting: Nurse Practitioner

## 2014-09-23 NOTE — Telephone Encounter (Signed)
LVM for pt to let her know that rx was sent to pharmacy

## 2014-10-01 ENCOUNTER — Ambulatory Visit (INDEPENDENT_AMBULATORY_CARE_PROVIDER_SITE_OTHER): Payer: Medicare Other | Admitting: Internal Medicine

## 2014-10-01 ENCOUNTER — Other Ambulatory Visit (INDEPENDENT_AMBULATORY_CARE_PROVIDER_SITE_OTHER): Payer: Medicare Other

## 2014-10-01 ENCOUNTER — Encounter: Payer: Self-pay | Admitting: Internal Medicine

## 2014-10-01 VITALS — BP 160/90 | HR 71 | Temp 98.4°F | Ht 64.0 in | Wt 142.5 lb

## 2014-10-01 DIAGNOSIS — I10 Essential (primary) hypertension: Secondary | ICD-10-CM

## 2014-10-01 DIAGNOSIS — E538 Deficiency of other specified B group vitamins: Secondary | ICD-10-CM

## 2014-10-01 DIAGNOSIS — H6123 Impacted cerumen, bilateral: Secondary | ICD-10-CM

## 2014-10-01 DIAGNOSIS — E785 Hyperlipidemia, unspecified: Secondary | ICD-10-CM

## 2014-10-01 DIAGNOSIS — Z0000 Encounter for general adult medical examination without abnormal findings: Secondary | ICD-10-CM | POA: Diagnosis not present

## 2014-10-01 LAB — HEPATIC FUNCTION PANEL
ALK PHOS: 52 U/L (ref 39–117)
ALT: 14 U/L (ref 0–35)
AST: 18 U/L (ref 0–37)
Albumin: 4.2 g/dL (ref 3.5–5.2)
BILIRUBIN TOTAL: 0.5 mg/dL (ref 0.2–1.2)
Bilirubin, Direct: 0.1 mg/dL (ref 0.0–0.3)
Total Protein: 7.9 g/dL (ref 6.0–8.3)

## 2014-10-01 LAB — URINALYSIS, ROUTINE W REFLEX MICROSCOPIC
BILIRUBIN URINE: NEGATIVE
Hgb urine dipstick: NEGATIVE
Ketones, ur: NEGATIVE
Nitrite: NEGATIVE
PH: 6.5 (ref 5.0–8.0)
RBC / HPF: NONE SEEN (ref 0–?)
Total Protein, Urine: NEGATIVE
URINE GLUCOSE: NEGATIVE
Urobilinogen, UA: 0.2 (ref 0.0–1.0)

## 2014-10-01 LAB — CBC WITH DIFFERENTIAL/PLATELET
BASOS ABS: 0 10*3/uL (ref 0.0–0.1)
BASOS PCT: 0.4 % (ref 0.0–3.0)
Eosinophils Absolute: 0.1 10*3/uL (ref 0.0–0.7)
Eosinophils Relative: 0.9 % (ref 0.0–5.0)
HEMATOCRIT: 42.5 % (ref 36.0–46.0)
HEMOGLOBIN: 14.6 g/dL (ref 12.0–15.0)
Lymphocytes Relative: 40.2 % (ref 12.0–46.0)
Lymphs Abs: 2.3 10*3/uL (ref 0.7–4.0)
MCHC: 34.4 g/dL (ref 30.0–36.0)
MCV: 91.4 fl (ref 78.0–100.0)
Monocytes Absolute: 0.3 10*3/uL (ref 0.1–1.0)
Monocytes Relative: 4.5 % (ref 3.0–12.0)
NEUTROS ABS: 3.1 10*3/uL (ref 1.4–7.7)
NEUTROS PCT: 54 % (ref 43.0–77.0)
Platelets: 171 10*3/uL (ref 150.0–400.0)
RBC: 4.65 Mil/uL (ref 3.87–5.11)
RDW: 14.5 % (ref 11.5–15.5)
WBC: 5.8 10*3/uL (ref 4.0–10.5)

## 2014-10-01 LAB — LIPID PANEL
Cholesterol: 191 mg/dL (ref 0–200)
HDL: 57.8 mg/dL (ref >39.00)
LDL Cholesterol: 119 mg/dL — ABNORMAL HIGH (ref 0–99)
NONHDL: 133.2
Total CHOL/HDL Ratio: 3
Triglycerides: 72 mg/dL (ref 0.0–149.0)
VLDL: 14.4 mg/dL (ref 0.0–40.0)

## 2014-10-01 LAB — BASIC METABOLIC PANEL
BUN: 14 mg/dL (ref 6–23)
CHLORIDE: 97 meq/L (ref 96–112)
CO2: 35 meq/L — AB (ref 19–32)
Calcium: 9.8 mg/dL (ref 8.4–10.5)
Creatinine, Ser: 0.85 mg/dL (ref 0.40–1.20)
GFR: 67.37 mL/min (ref >60.00)
GLUCOSE: 105 mg/dL — AB (ref 70–99)
Potassium: 4.2 mEq/L (ref 3.5–5.1)
Sodium: 134 mEq/L — ABNORMAL LOW (ref 135–145)

## 2014-10-01 LAB — TSH: TSH: 2.19 u[IU]/mL (ref 0.35–4.50)

## 2014-10-01 NOTE — Assessment & Plan Note (Signed)
Here for medicare wellness/physical  Diet: heart healthy  Physical activity: sedentary  Depression/mood screen: negative  Hearing: intact to whispered voice  Visual acuity: grossly normal, performs annual eye exam  ADLs: capable  Fall risk: mild  Home safety: good  Cognitive evaluation: intact to orientation, naming, recall and repetition  EOL planning: adv directives, full code/ I agree  I have personally reviewed and have noted  1. The patient's medical, surgical and social history  2. Their use of alcohol, tobacco or illicit drugs  3. Their current medications and supplements  4. The patient's functional ability including ADL's, fall risks, home safety risks and hearing or visual impairment. No driving at night. 5. Diet and physical activities  6. Evidence for depression or mood disorders    Today patient counseled on age appropriate routine health concerns for screening and prevention, each reviewed and up to date or declined. Immunizations reviewed and up to date or declined. Labs ordered and reviewed. Risk factors for depression reviewed and negative. Hearing function and visual acuity are intact. ADLs screened and addressed as needed. Functional ability and level of safety reviewed and appropriate. Education, counseling and referrals performed based on assessed risks today. Patient provided with a copy of personalized plan for preventive services.

## 2014-10-01 NOTE — Patient Instructions (Signed)
Preventive Care for Adults A healthy lifestyle and preventive care can promote health and wellness. Preventive health guidelines for women include the following key practices.  A routine yearly physical is a good way to check with your health care provider about your health and preventive screening. It is a chance to share any concerns and updates on your health and to receive a thorough exam.  Visit your dentist for a routine exam and preventive care every 6 months. Brush your teeth twice a day and floss once a day. Good oral hygiene prevents tooth decay and gum disease.  The frequency of eye exams is based on your age, health, family medical history, use of contact lenses, and other factors. Follow your health care provider's recommendations for frequency of eye exams.  Eat a healthy diet. Foods like vegetables, fruits, whole grains, low-fat dairy products, and lean protein foods contain the nutrients you need without too many calories. Decrease your intake of foods high in solid fats, added sugars, and salt. Eat the right amount of calories for you.Get information about a proper diet from your health care provider, if necessary.  Regular physical exercise is one of the most important things you can do for your health. Most adults should get at least 150 minutes of moderate-intensity exercise (any activity that increases your heart rate and causes you to sweat) each week. In addition, most adults need muscle-strengthening exercises on 2 or more days a week.  Maintain a healthy weight. The body mass index (BMI) is a screening tool to identify possible weight problems. It provides an estimate of body fat based on height and weight. Your health care provider can find your BMI and can help you achieve or maintain a healthy weight.For adults 20 years and older:  A BMI below 18.5 is considered underweight.  A BMI of 18.5 to 24.9 is normal.  A BMI of 25 to 29.9 is considered overweight.  A BMI of  30 and above is considered obese.  Maintain normal blood lipids and cholesterol levels by exercising and minimizing your intake of saturated fat. Eat a balanced diet with plenty of fruit and vegetables. Blood tests for lipids and cholesterol should begin at age 76 and be repeated every 5 years. If your lipid or cholesterol levels are high, you are over 50, or you are at high risk for heart disease, you may need your cholesterol levels checked more frequently.Ongoing high lipid and cholesterol levels should be treated with medicines if diet and exercise are not working.  If you smoke, find out from your health care provider how to quit. If you do not use tobacco, do not start.  Lung cancer screening is recommended for adults aged 22-80 years who are at high risk for developing lung cancer because of a history of smoking. A yearly low-dose CT scan of the lungs is recommended for people who have at least a 30-pack-year history of smoking and are a current smoker or have quit within the past 15 years. A pack year of smoking is smoking an average of 1 pack of cigarettes a day for 1 year (for example: 1 pack a day for 30 years or 2 packs a day for 15 years). Yearly screening should continue until the smoker has stopped smoking for at least 15 years. Yearly screening should be stopped for people who develop a health problem that would prevent them from having lung cancer treatment.  If you are pregnant, do not drink alcohol. If you are breastfeeding,  be very cautious about drinking alcohol. If you are not pregnant and choose to drink alcohol, do not have more than 1 drink per day. One drink is considered to be 12 ounces (355 mL) of beer, 5 ounces (148 mL) of wine, or 1.5 ounces (44 mL) of liquor.  Avoid use of street drugs. Do not share needles with anyone. Ask for help if you need support or instructions about stopping the use of drugs.  High blood pressure causes heart disease and increases the risk of  stroke. Your blood pressure should be checked at least every 1 to 2 years. Ongoing high blood pressure should be treated with medicines if weight loss and exercise do not work.  If you are 75-52 years old, ask your health care provider if you should take aspirin to prevent strokes.  Diabetes screening involves taking a blood sample to check your fasting blood sugar level. This should be done once every 3 years, after age 15, if you are within normal weight and without risk factors for diabetes. Testing should be considered at a younger age or be carried out more frequently if you are overweight and have at least 1 risk factor for diabetes.  Breast cancer screening is essential preventive care for women. You should practice "breast self-awareness." This means understanding the normal appearance and feel of your breasts and may include breast self-examination. Any changes detected, no matter how small, should be reported to a health care provider. Women in their 58s and 30s should have a clinical breast exam (CBE) by a health care provider as part of a regular health exam every 1 to 3 years. After age 16, women should have a CBE every year. Starting at age 53, women should consider having a mammogram (breast X-ray test) every year. Women who have a family history of breast cancer should talk to their health care provider about genetic screening. Women at a high risk of breast cancer should talk to their health care providers about having an MRI and a mammogram every year.  Breast cancer gene (BRCA)-related cancer risk assessment is recommended for women who have family members with BRCA-related cancers. BRCA-related cancers include breast, ovarian, tubal, and peritoneal cancers. Having family members with these cancers may be associated with an increased risk for harmful changes (mutations) in the breast cancer genes BRCA1 and BRCA2. Results of the assessment will determine the need for genetic counseling and  BRCA1 and BRCA2 testing.  Routine pelvic exams to screen for cancer are no longer recommended for nonpregnant women who are considered low risk for cancer of the pelvic organs (ovaries, uterus, and vagina) and who do not have symptoms. Ask your health care provider if a screening pelvic exam is right for you.  If you have had past treatment for cervical cancer or a condition that could lead to cancer, you need Pap tests and screening for cancer for at least 20 years after your treatment. If Pap tests have been discontinued, your risk factors (such as having a new sexual partner) need to be reassessed to determine if screening should be resumed. Some women have medical problems that increase the chance of getting cervical cancer. In these cases, your health care provider may recommend more frequent screening and Pap tests.  The HPV test is an additional test that may be used for cervical cancer screening. The HPV test looks for the virus that can cause the cell changes on the cervix. The cells collected during the Pap test can be  tested for HPV. The HPV test could be used to screen women aged 30 years and older, and should be used in women of any age who have unclear Pap test results. After the age of 30, women should have HPV testing at the same frequency as a Pap test.  Colorectal cancer can be detected and often prevented. Most routine colorectal cancer screening begins at the age of 50 years and continues through age 75 years. However, your health care provider may recommend screening at an earlier age if you have risk factors for colon cancer. On a yearly basis, your health care provider may provide home test kits to check for hidden blood in the stool. Use of a small camera at the end of a tube, to directly examine the colon (sigmoidoscopy or colonoscopy), can detect the earliest forms of colorectal cancer. Talk to your health care provider about this at age 50, when routine screening begins. Direct  exam of the colon should be repeated every 5-10 years through age 75 years, unless early forms of pre-cancerous polyps or small growths are found.  People who are at an increased risk for hepatitis B should be screened for this virus. You are considered at high risk for hepatitis B if:  You were born in a country where hepatitis B occurs often. Talk with your health care provider about which countries are considered high risk.  Your parents were born in a high-risk country and you have not received a shot to protect against hepatitis B (hepatitis B vaccine).  You have HIV or AIDS.  You use needles to inject street drugs.  You live with, or have sex with, someone who has hepatitis B.  You get hemodialysis treatment.  You take certain medicines for conditions like cancer, organ transplantation, and autoimmune conditions.  Hepatitis C blood testing is recommended for all people born from 1945 through 1965 and any individual with known risks for hepatitis C.  Practice safe sex. Use condoms and avoid high-risk sexual practices to reduce the spread of sexually transmitted infections (STIs). STIs include gonorrhea, chlamydia, syphilis, trichomonas, herpes, HPV, and human immunodeficiency virus (HIV). Herpes, HIV, and HPV are viral illnesses that have no cure. They can result in disability, cancer, and death.  You should be screened for sexually transmitted illnesses (STIs) including gonorrhea and chlamydia if:  You are sexually active and are younger than 24 years.  You are older than 24 years and your health care provider tells you that you are at risk for this type of infection.  Your sexual activity has changed since you were last screened and you are at an increased risk for chlamydia or gonorrhea. Ask your health care provider if you are at risk.  If you are at risk of being infected with HIV, it is recommended that you take a prescription medicine daily to prevent HIV infection. This is  called preexposure prophylaxis (PrEP). You are considered at risk if:  You are a heterosexual woman, are sexually active, and are at increased risk for HIV infection.  You take drugs by injection.  You are sexually active with a partner who has HIV.  Talk with your health care provider about whether you are at high risk of being infected with HIV. If you choose to begin PrEP, you should first be tested for HIV. You should then be tested every 3 months for as long as you are taking PrEP.  Osteoporosis is a disease in which the bones lose minerals and strength   with aging. This can result in serious bone fractures or breaks. The risk of osteoporosis can be identified using a bone density scan. Women ages 65 years and over and women at risk for fractures or osteoporosis should discuss screening with their health care providers. Ask your health care provider whether you should take a calcium supplement or vitamin D to reduce the rate of osteoporosis.  Menopause can be associated with physical symptoms and risks. Hormone replacement therapy is available to decrease symptoms and risks. You should talk to your health care provider about whether hormone replacement therapy is right for you.  Use sunscreen. Apply sunscreen liberally and repeatedly throughout the day. You should seek shade when your shadow is shorter than you. Protect yourself by wearing long sleeves, pants, a wide-brimmed hat, and sunglasses year round, whenever you are outdoors.  Once a month, do a whole body skin exam, using a mirror to look at the skin on your back. Tell your health care provider of new moles, moles that have irregular borders, moles that are larger than a pencil eraser, or moles that have changed in shape or color.  Stay current with required vaccines (immunizations).  Influenza vaccine. All adults should be immunized every year.  Tetanus, diphtheria, and acellular pertussis (Td, Tdap) vaccine. Pregnant women should  receive 1 dose of Tdap vaccine during each pregnancy. The dose should be obtained regardless of the length of time since the last dose. Immunization is preferred during the 27th-36th week of gestation. An adult who has not previously received Tdap or who does not know her vaccine status should receive 1 dose of Tdap. This initial dose should be followed by tetanus and diphtheria toxoids (Td) booster doses every 10 years. Adults with an unknown or incomplete history of completing a 3-dose immunization series with Td-containing vaccines should begin or complete a primary immunization series including a Tdap dose. Adults should receive a Td booster every 10 years.  Varicella vaccine. An adult without evidence of immunity to varicella should receive 2 doses or a second dose if she has previously received 1 dose. Pregnant females who do not have evidence of immunity should receive the first dose after pregnancy. This first dose should be obtained before leaving the health care facility. The second dose should be obtained 4-8 weeks after the first dose.  Human papillomavirus (HPV) vaccine. Females aged 13-26 years who have not received the vaccine previously should obtain the 3-dose series. The vaccine is not recommended for use in pregnant females. However, pregnancy testing is not needed before receiving a dose. If a female is found to be pregnant after receiving a dose, no treatment is needed. In that case, the remaining doses should be delayed until after the pregnancy. Immunization is recommended for any person with an immunocompromised condition through the age of 26 years if she did not get any or all doses earlier. During the 3-dose series, the second dose should be obtained 4-8 weeks after the first dose. The third dose should be obtained 24 weeks after the first dose and 16 weeks after the second dose.  Zoster vaccine. One dose is recommended for adults aged 60 years or older unless certain conditions are  present.  Measles, mumps, and rubella (MMR) vaccine. Adults born before 1957 generally are considered immune to measles and mumps. Adults born in 1957 or later should have 1 or more doses of MMR vaccine unless there is a contraindication to the vaccine or there is laboratory evidence of immunity to   each of the three diseases. A routine second dose of MMR vaccine should be obtained at least 28 days after the first dose for students attending postsecondary schools, health care workers, or international travelers. People who received inactivated measles vaccine or an unknown type of measles vaccine during 1963-1967 should receive 2 doses of MMR vaccine. People who received inactivated mumps vaccine or an unknown type of mumps vaccine before 1979 and are at high risk for mumps infection should consider immunization with 2 doses of MMR vaccine. For females of childbearing age, rubella immunity should be determined. If there is no evidence of immunity, females who are not pregnant should be vaccinated. If there is no evidence of immunity, females who are pregnant should delay immunization until after pregnancy. Unvaccinated health care workers born before 1957 who lack laboratory evidence of measles, mumps, or rubella immunity or laboratory confirmation of disease should consider measles and mumps immunization with 2 doses of MMR vaccine or rubella immunization with 1 dose of MMR vaccine.  Pneumococcal 13-valent conjugate (PCV13) vaccine. When indicated, a person who is uncertain of her immunization history and has no record of immunization should receive the PCV13 vaccine. An adult aged 19 years or older who has certain medical conditions and has not been previously immunized should receive 1 dose of PCV13 vaccine. This PCV13 should be followed with a dose of pneumococcal polysaccharide (PPSV23) vaccine. The PPSV23 vaccine dose should be obtained at least 8 weeks after the dose of PCV13 vaccine. An adult aged 19  years or older who has certain medical conditions and previously received 1 or more doses of PPSV23 vaccine should receive 1 dose of PCV13. The PCV13 vaccine dose should be obtained 1 or more years after the last PPSV23 vaccine dose.  Pneumococcal polysaccharide (PPSV23) vaccine. When PCV13 is also indicated, PCV13 should be obtained first. All adults aged 65 years and older should be immunized. An adult younger than age 65 years who has certain medical conditions should be immunized. Any person who resides in a nursing home or long-term care facility should be immunized. An adult smoker should be immunized. People with an immunocompromised condition and certain other conditions should receive both PCV13 and PPSV23 vaccines. People with human immunodeficiency virus (HIV) infection should be immunized as soon as possible after diagnosis. Immunization during chemotherapy or radiation therapy should be avoided. Routine use of PPSV23 vaccine is not recommended for American Indians, [place] Natives, or people younger than 65 years unless there are medical conditions that require PPSV23 vaccine. When indicated, people who have unknown immunization and have no record of immunization should receive PPSV23 vaccine. One-time revaccination 5 years after the first dose of PPSV23 is recommended for people aged 19-64 years who have chronic kidney failure, nephrotic syndrome, asplenia, or immunocompromised conditions. People who received 1-2 doses of PPSV23 before age 65 years should receive another dose of PPSV23 vaccine at age 65 years or later if at least 5 years have passed since the previous dose. Doses of PPSV23 are not needed for people immunized with PPSV23 at or after age 65 years.  Meningococcal vaccine. Adults with asplenia or persistent complement component deficiencies should receive 2 doses of quadrivalent meningococcal conjugate (MenACWY-D) vaccine. The doses should be obtained at least 2 months apart.  Microbiologists working with certain meningococcal bacteria, military recruits, people at risk during an outbreak, and people who travel to or live in countries with a high rate of meningitis should be immunized. A first-year college student up through age   21 years who is living in a residence hall should receive a dose if she did not receive a dose on or after her 16th birthday. Adults who have certain high-risk conditions should receive one or more doses of vaccine.  Hepatitis A vaccine. Adults who wish to be protected from this disease, have certain high-risk conditions, work with hepatitis A-infected animals, work in hepatitis A research labs, or travel to or work in countries with a high rate of hepatitis A should be immunized. Adults who were previously unvaccinated and who anticipate close contact with an international adoptee during the first 60 days after arrival in the [place] from a country with a high rate of hepatitis A should be immunized.  Hepatitis B vaccine. Adults who wish to be protected from this disease, have certain high-risk conditions, may be exposed to blood or other infectious body fluids, are household contacts or sex partners of hepatitis B positive people, are clients or workers in certain care facilities, or travel to or work in countries with a high rate of hepatitis B should be immunized.  Haemophilus influenzae type b (Hib) vaccine. A previously unvaccinated person with asplenia or sickle cell disease or having a scheduled splenectomy should receive 1 dose of Hib vaccine. Regardless of previous immunization, a recipient of a hematopoietic stem cell transplant should receive a 3-dose series 6-12 months after her successful transplant. Hib vaccine is not recommended for adults with HIV infection. Preventive Services / Frequency Ages 64 to 68 years  Blood pressure check.** / Every 1 to 2 years.  Lipid and cholesterol check.** / Every 5 years beginning at age  22.  Clinical breast exam.** / Every 3 years for women in their 88s and 53s.  BRCA-related cancer risk assessment.** / For women who have family members with a BRCA-related cancer (breast, ovarian, tubal, or peritoneal cancers).  Pap test.** / Every 2 years from ages 90 through 51. Every 3 years starting at age 21 through age 56 or 3 with a history of 3 consecutive normal Pap tests.  HPV screening.** / Every 3 years from ages 24 through ages 1 to 46 with a history of 3 consecutive normal Pap tests.  Hepatitis C blood test.** / For any individual with known risks for hepatitis C.  Skin self-exam. / Monthly.  Influenza vaccine. / Every year.  Tetanus, diphtheria, and acellular pertussis (Tdap, Td) vaccine.** / Consult your health care provider. Pregnant women should receive 1 dose of Tdap vaccine during each pregnancy. 1 dose of Td every 10 years.  Varicella vaccine.** / Consult your health care provider. Pregnant females who do not have evidence of immunity should receive the first dose after pregnancy.  HPV vaccine. / 3 doses over 6 months, if 72 and younger. The vaccine is not recommended for use in pregnant females. However, pregnancy testing is not needed before receiving a dose.  Measles, mumps, rubella (MMR) vaccine.** / You need at least 1 dose of MMR if you were born in 1957 or later. You may also need a 2nd dose. For females of childbearing age, rubella immunity should be determined. If there is no evidence of immunity, females who are not pregnant should be vaccinated. If there is no evidence of immunity, females who are pregnant should delay immunization until after pregnancy.  Pneumococcal 13-valent conjugate (PCV13) vaccine.** / Consult your health care provider.  Pneumococcal polysaccharide (PPSV23) vaccine.** / 1 to 2 doses if you smoke cigarettes or if you have certain conditions.  Meningococcal vaccine.** /  1 dose if you are age 19 to 21 years and a first-year college  student living in a residence hall, or have one of several medical conditions, you need to get vaccinated against meningococcal disease. You may also need additional booster doses.  Hepatitis A vaccine.** / Consult your health care provider.  Hepatitis B vaccine.** / Consult your health care provider.  Haemophilus influenzae type b (Hib) vaccine.** / Consult your health care provider. Ages 40 to 64 years  Blood pressure check.** / Every 1 to 2 years.  Lipid and cholesterol check.** / Every 5 years beginning at age 20 years.  Lung cancer screening. / Every year if you are aged 55-80 years and have a 30-pack-year history of smoking and currently smoke or have quit within the past 15 years. Yearly screening is stopped once you have quit smoking for at least 15 years or develop a health problem that would prevent you from having lung cancer treatment.  Clinical breast exam.** / Every year after age 40 years.  BRCA-related cancer risk assessment.** / For women who have family members with a BRCA-related cancer (breast, ovarian, tubal, or peritoneal cancers).  Mammogram.** / Every year beginning at age 40 years and continuing for as long as you are in good health. Consult with your health care provider.  Pap test.** / Every 3 years starting at age 30 years through age 65 or 70 years with a history of 3 consecutive normal Pap tests.  HPV screening.** / Every 3 years from ages 30 years through ages 65 to 70 years with a history of 3 consecutive normal Pap tests.  Fecal occult blood test (FOBT) of stool. / Every year beginning at age 50 years and continuing until age 75 years. You may not need to do this test if you get a colonoscopy every 10 years.  Flexible sigmoidoscopy or colonoscopy.** / Every 5 years for a flexible sigmoidoscopy or every 10 years for a colonoscopy beginning at age 50 years and continuing until age 75 years.  Hepatitis C blood test.** / For all people born from 1945 through  1965 and any individual with known risks for hepatitis C.  Skin self-exam. / Monthly.  Influenza vaccine. / Every year.  Tetanus, diphtheria, and acellular pertussis (Tdap/Td) vaccine.** / Consult your health care provider. Pregnant women should receive 1 dose of Tdap vaccine during each pregnancy. 1 dose of Td every 10 years.  Varicella vaccine.** / Consult your health care provider. Pregnant females who do not have evidence of immunity should receive the first dose after pregnancy.  Zoster vaccine.** / 1 dose for adults aged 60 years or older.  Measles, mumps, rubella (MMR) vaccine.** / You need at least 1 dose of MMR if you were born in 1957 or later. You may also need a 2nd dose. For females of childbearing age, rubella immunity should be determined. If there is no evidence of immunity, females who are not pregnant should be vaccinated. If there is no evidence of immunity, females who are pregnant should delay immunization until after pregnancy.  Pneumococcal 13-valent conjugate (PCV13) vaccine.** / Consult your health care provider.  Pneumococcal polysaccharide (PPSV23) vaccine.** / 1 to 2 doses if you smoke cigarettes or if you have certain conditions.  Meningococcal vaccine.** / Consult your health care provider.  Hepatitis A vaccine.** / Consult your health care provider.  Hepatitis B vaccine.** / Consult your health care provider.  Haemophilus influenzae type b (Hib) vaccine.** / Consult your health care provider. Ages 65   years and over  Blood pressure check.** / Every 1 to 2 years.  Lipid and cholesterol check.** / Every 5 years beginning at age 22 years.  Lung cancer screening. / Every year if you are aged 73-80 years and have a 30-pack-year history of smoking and currently smoke or have quit within the past 15 years. Yearly screening is stopped once you have quit smoking for at least 15 years or develop a health problem that would prevent you from having lung cancer  treatment.  Clinical breast exam.** / Every year after age 4 years.  BRCA-related cancer risk assessment.** / For women who have family members with a BRCA-related cancer (breast, ovarian, tubal, or peritoneal cancers).  Mammogram.** / Every year beginning at age 40 years and continuing for as long as you are in good health. Consult with your health care provider.  Pap test.** / Every 3 years starting at age 9 years through age 34 or 91 years with 3 consecutive normal Pap tests. Testing can be stopped between 65 and 70 years with 3 consecutive normal Pap tests and no abnormal Pap or HPV tests in the past 10 years.  HPV screening.** / Every 3 years from ages 57 years through ages 64 or 45 years with a history of 3 consecutive normal Pap tests. Testing can be stopped between 65 and 70 years with 3 consecutive normal Pap tests and no abnormal Pap or HPV tests in the past 10 years.  Fecal occult blood test (FOBT) of stool. / Every year beginning at age 15 years and continuing until age 17 years. You may not need to do this test if you get a colonoscopy every 10 years.  Flexible sigmoidoscopy or colonoscopy.** / Every 5 years for a flexible sigmoidoscopy or every 10 years for a colonoscopy beginning at age 86 years and continuing until age 71 years.  Hepatitis C blood test.** / For all people born from [birth year] through 1963 and any individual with known risks for hepatitis C.  Osteoporosis screening.** / A one-time screening for women ages 83 years and over and women at risk for fractures or osteoporosis.  Skin self-exam. / Monthly.  Influenza vaccine. / Every year.  Tetanus, diphtheria, and acellular pertussis (Tdap/Td) vaccine.** / 1 dose of Td every 10 years.  Varicella vaccine.** / Consult your health care provider.  Zoster vaccine.** / 1 dose for adults aged 61 years or older.  Pneumococcal 13-valent conjugate (PCV13) vaccine.** / Consult your health care provider.  Pneumococcal  polysaccharide (PPSV23) vaccine.** / 1 dose for all adults aged 28 years and older.  Meningococcal vaccine.** / Consult your health care provider.  Hepatitis A vaccine.** / Consult your health care provider.  Hepatitis B vaccine.** / Consult your health care provider.  Haemophilus influenzae type b (Hib) vaccine.** / Consult your health care provider. ** Family history and personal history of risk and conditions may change your health care provider's recommendations. Document Released: 09/05/2001 Document Revised: 11/24/2013 Document Reviewed: 12/05/2010 [facility] Patient Information 2015 [place], [place]. This information is not intended to replace advice given to you by your health care provider. Make sure you discuss any questions you have with your health care provider.

## 2014-10-01 NOTE — Progress Notes (Signed)
Pre visit review using our clinic review tool, if applicable. No additional management support is needed unless otherwise documented below in the visit note. 

## 2014-10-01 NOTE — Assessment & Plan Note (Addendum)
Chronic. On Amlodipne  Check BP at home  BP Readings from Last 3 Encounters:  10/01/14 160/90  09/22/14 138/66  09/16/14 122/60

## 2014-10-01 NOTE — Assessment & Plan Note (Addendum)
Chronic - B12 shots q 1 mo Check levels of B12

## 2014-10-01 NOTE — Progress Notes (Signed)
Subjective:    HPI   The patient is here for a wellness exam. The patient has been doing well overall without major physical or psychological issues going on lately.  Her son died on 11-11-2013 in a NH - CVA. Seeing Dr Berenice Primas The patient presents for a follow-up of  chronic hypertension, chronic dyslipidemia, anxiety (mild) controlled with medicines.  BP is good home.  F/u Vit B12 def - Nascobal was $$$ She fell on Dec 7th 2014 and fractured her right upper arm. She reports the arm is healing.  She had a Life Line Screening - found to have A fib  Her 2nd son had a CVA in 2.13  BP Readings from Last 3 Encounters:  10/01/14 160/90  09/22/14 138/66  09/16/14 122/60   Wt Readings from Last 3 Encounters:  10/01/14 142 lb 8 oz (64.638 kg)  09/22/14 140 lb 14.4 oz (63.912 kg)  09/15/14 143 lb (64.864 kg)       Review of Systems  Constitutional: Positive for fatigue. Negative for fever, chills, diaphoresis, activity change, appetite change and unexpected weight change.  HENT: Negative for congestion, ear pain, facial swelling, hearing loss, mouth sores, nosebleeds, postnasal drip, rhinorrhea, sinus pressure, sneezing, sore throat, tinnitus and trouble swallowing.   Eyes: Negative for pain, discharge, redness, itching and visual disturbance.  Respiratory: Negative for cough, chest tightness, shortness of breath, wheezing and stridor.   Cardiovascular: Negative for chest pain, palpitations and leg swelling.  Gastrointestinal: Negative for nausea, diarrhea, constipation, blood in stool, abdominal distention, anal bleeding and rectal pain.  Genitourinary: Negative for dysuria, urgency, frequency, hematuria, flank pain, vaginal bleeding, vaginal discharge, difficulty urinating, genital sores and pelvic pain.  Musculoskeletal: Negative for back pain, joint swelling, arthralgias, gait problem, neck pain and neck stiffness.  Skin: Negative.  Negative for rash.  Neurological: Negative for  dizziness, tremors, seizures, syncope, speech difficulty, weakness, numbness and headaches.  Hematological: Negative for adenopathy. Does not bruise/bleed easily.  Psychiatric/Behavioral: Negative for suicidal ideas, behavioral problems, sleep disturbance, dysphoric mood and decreased concentration. The patient is nervous/anxious.        Objective:   Physical Exam  Constitutional: She appears well-developed. No distress.  obese  HENT:  Head: Normocephalic.  Right Ear: External ear normal.  Left Ear: External ear normal.  Nose: Nose normal.  Mouth/Throat: Oropharynx is clear and moist.  No halitosis  Eyes: Conjunctivae are normal. Pupils are equal, round, and reactive to light. Right eye exhibits no discharge. Left eye exhibits no discharge.  Neck: Normal range of motion. Neck supple. No JVD present. No tracheal deviation present. No thyromegaly present.  Cardiovascular: Normal rate, regular rhythm and normal heart sounds.   occ irreg beats  Pulmonary/Chest: No stridor. No respiratory distress. She has no wheezes.  Abdominal: Soft. Bowel sounds are normal. She exhibits no distension and no mass. There is no tenderness. There is no rebound and no guarding.  Musculoskeletal: She exhibits no edema or tenderness.  Lymphadenopathy:    She has no cervical adenopathy.  Neurological: She displays normal reflexes. No cranial nerve deficit. She exhibits normal muscle tone. Coordination normal.  Skin: No rash noted. No erythema.  Psychiatric: She has a normal mood and affect. Her behavior is normal. Judgment and thought content normal.  R shoulder is tender w/ROM, restricted in ROM - a little better AKs on face L ear wax  Lab Results  Component Value Date   WBC 4.9 09/22/2014   HGB 13.4 09/22/2014   HCT 40.4  09/22/2014   PLT 169 09/22/2014   GLUCOSE 90 03/12/2014   CHOL 187 08/26/2013   TRIG 74.0 08/26/2013   HDL 65.70 08/26/2013   LDLCALC 107* 08/26/2013   ALT 16 03/12/2014   AST 23  03/12/2014   NA 132* 03/12/2014   K 3.8 03/12/2014   CL 95* 03/12/2014   CREATININE 0.8 03/12/2014   BUN 22 03/12/2014   CO2 31 03/12/2014   TSH 1.98 03/12/2014   INR 1.0 10/07/2008   HGBA1C  10/07/2008    5.9 (NOTE)   The ADA recommends the following therapeutic goal for glycemic   control related to Hgb A1C measurement:   Goal of Therapy:   < 7.0% Hgb A1C   Reference: American Diabetes Association: Clinical Practice   Recommendations 2008, Diabetes Care,  2008, 31:(Suppl 1).   MICROALBUR 0.4 07/14/2009    L ear wax was removed w/a loop - Time: +15 min      Assessment & Plan:

## 2014-10-01 NOTE — Assessment & Plan Note (Signed)
On Simvastatin 

## 2014-10-13 ENCOUNTER — Other Ambulatory Visit: Payer: Self-pay | Admitting: Internal Medicine

## 2014-10-14 ENCOUNTER — Ambulatory Visit (HOSPITAL_BASED_OUTPATIENT_CLINIC_OR_DEPARTMENT_OTHER): Payer: Medicare Other

## 2014-10-14 VITALS — BP 124/59 | HR 61 | Temp 98.3°F | Resp 17

## 2014-10-14 DIAGNOSIS — D801 Nonfamilial hypogammaglobulinemia: Secondary | ICD-10-CM | POA: Diagnosis not present

## 2014-10-14 DIAGNOSIS — C9110 Chronic lymphocytic leukemia of B-cell type not having achieved remission: Secondary | ICD-10-CM

## 2014-10-14 MED ORDER — SODIUM CHLORIDE 0.9 % IV SOLN
Freq: Once | INTRAVENOUS | Status: AC
  Administered 2014-10-14: 09:00:00 via INTRAVENOUS

## 2014-10-14 MED ORDER — DIPHENHYDRAMINE HCL 25 MG PO CAPS
ORAL_CAPSULE | ORAL | Status: AC
  Filled 2014-10-14: qty 1

## 2014-10-14 MED ORDER — DIPHENHYDRAMINE HCL 25 MG PO CAPS
25.0000 mg | ORAL_CAPSULE | Freq: Once | ORAL | Status: AC
  Administered 2014-10-14: 25 mg via ORAL

## 2014-10-14 MED ORDER — IMMUNE GLOBULIN (HUMAN) 10 GM/100ML IV SOLN
30.0000 g | Freq: Once | INTRAVENOUS | Status: AC
  Administered 2014-10-14: 30 g via INTRAVENOUS
  Filled 2014-10-14: qty 300

## 2014-10-14 NOTE — Patient Instructions (Signed)

## 2014-10-31 ENCOUNTER — Other Ambulatory Visit: Payer: Self-pay | Admitting: Internal Medicine

## 2014-11-09 ENCOUNTER — Other Ambulatory Visit: Payer: Self-pay | Admitting: Internal Medicine

## 2014-11-11 ENCOUNTER — Ambulatory Visit (HOSPITAL_BASED_OUTPATIENT_CLINIC_OR_DEPARTMENT_OTHER): Payer: Medicare Other

## 2014-11-11 VITALS — BP 123/54 | HR 60 | Temp 97.8°F | Resp 16

## 2014-11-11 DIAGNOSIS — C9110 Chronic lymphocytic leukemia of B-cell type not having achieved remission: Secondary | ICD-10-CM

## 2014-11-11 DIAGNOSIS — D801 Nonfamilial hypogammaglobulinemia: Secondary | ICD-10-CM

## 2014-11-11 MED ORDER — DIPHENHYDRAMINE HCL 25 MG PO CAPS
ORAL_CAPSULE | ORAL | Status: AC
  Filled 2014-11-11: qty 1

## 2014-11-11 MED ORDER — DIPHENHYDRAMINE HCL 25 MG PO CAPS
25.0000 mg | ORAL_CAPSULE | Freq: Once | ORAL | Status: AC
  Administered 2014-11-11: 25 mg via ORAL

## 2014-11-11 MED ORDER — IMMUNE GLOBULIN (HUMAN) 10 GM/100ML IV SOLN
30.0000 g | Freq: Once | INTRAVENOUS | Status: AC
  Administered 2014-11-11: 30 g via INTRAVENOUS
  Filled 2014-11-11: qty 300

## 2014-11-11 NOTE — Patient Instructions (Signed)

## 2014-12-09 ENCOUNTER — Ambulatory Visit (HOSPITAL_BASED_OUTPATIENT_CLINIC_OR_DEPARTMENT_OTHER): Payer: Medicare Other

## 2014-12-09 VITALS — BP 117/62 | HR 58 | Temp 98.1°F | Resp 16

## 2014-12-09 DIAGNOSIS — C9110 Chronic lymphocytic leukemia of B-cell type not having achieved remission: Secondary | ICD-10-CM

## 2014-12-09 DIAGNOSIS — D801 Nonfamilial hypogammaglobulinemia: Secondary | ICD-10-CM

## 2014-12-09 MED ORDER — DIPHENHYDRAMINE HCL 25 MG PO CAPS
25.0000 mg | ORAL_CAPSULE | Freq: Once | ORAL | Status: AC
  Administered 2014-12-09: 25 mg via ORAL

## 2014-12-09 MED ORDER — SODIUM CHLORIDE 0.9 % IV SOLN
Freq: Once | INTRAVENOUS | Status: AC
  Administered 2014-12-09: 09:00:00 via INTRAVENOUS

## 2014-12-09 MED ORDER — DIPHENHYDRAMINE HCL 25 MG PO CAPS
ORAL_CAPSULE | ORAL | Status: AC
  Filled 2014-12-09: qty 1

## 2014-12-09 MED ORDER — IMMUNE GLOBULIN (HUMAN) 10 GM/100ML IV SOLN
30.0000 g | Freq: Once | INTRAVENOUS | Status: AC
  Administered 2014-12-09: 30 g via INTRAVENOUS
  Filled 2014-12-09: qty 300

## 2014-12-09 NOTE — Patient Instructions (Signed)

## 2014-12-14 ENCOUNTER — Encounter: Payer: Self-pay | Admitting: Internal Medicine

## 2014-12-14 ENCOUNTER — Ambulatory Visit (INDEPENDENT_AMBULATORY_CARE_PROVIDER_SITE_OTHER): Payer: Medicare Other | Admitting: Internal Medicine

## 2014-12-14 VITALS — BP 140/78 | HR 65 | Wt 143.0 lb

## 2014-12-14 DIAGNOSIS — R5382 Chronic fatigue, unspecified: Secondary | ICD-10-CM

## 2014-12-14 DIAGNOSIS — E538 Deficiency of other specified B group vitamins: Secondary | ICD-10-CM | POA: Diagnosis not present

## 2014-12-14 DIAGNOSIS — I10 Essential (primary) hypertension: Secondary | ICD-10-CM

## 2014-12-14 NOTE — Assessment & Plan Note (Signed)
Chronic - B12 shots q 1 mo

## 2014-12-14 NOTE — Assessment & Plan Note (Signed)
Chronic. Controlled now On Amlodipne

## 2014-12-14 NOTE — Assessment & Plan Note (Signed)
Chronic and multifactorial Labs reviewed Hold Simvastatin x 2 weeks, then start 1/2 tab a day to see if leg weakness would get better

## 2014-12-14 NOTE — Progress Notes (Signed)
Pre visit review using our clinic review tool, if applicable. No additional management support is needed unless otherwise documented below in the visit note. 

## 2014-12-14 NOTE — Progress Notes (Signed)
Subjective:    HPI   C/o fatigue and weakness - chronic. C/o leg weakness: using a walker sometimes at home. Denies depression.  Her son died on 2013/11/18 in a NH - CVA. Seeing Dr Berenice Primas The patient presents for a follow-up of  chronic hypertension, chronic dyslipidemia, anxiety (mild) controlled with medicines.  BP is good home.  F/u Vit B12 def - Nascobal was $$$ She fell on Dec 7th 2014 and fractured her right upper arm. She reports the arm is healing.  She had a Life Line Screening - found to have A fib  Her 2nd son had a CVA in 2.13  BP Readings from Last 3 Encounters:  12/14/14 140/78  12/09/14 117/62  11-19-14 123/54   Wt Readings from Last 3 Encounters:  12/14/14 143 lb (64.864 kg)  10/01/14 142 lb 8 oz (64.638 kg)  09/22/14 140 lb 14.4 oz (63.912 kg)       Review of Systems  Constitutional: Positive for fatigue. Negative for fever, chills, diaphoresis, activity change, appetite change and unexpected weight change.  HENT: Negative for congestion, ear pain, facial swelling, hearing loss, mouth sores, nosebleeds, postnasal drip, rhinorrhea, sinus pressure, sneezing, sore throat, tinnitus and trouble swallowing.   Eyes: Negative for pain, discharge, redness, itching and visual disturbance.  Respiratory: Negative for cough, chest tightness, shortness of breath, wheezing and stridor.   Cardiovascular: Negative for chest pain, palpitations and leg swelling.  Gastrointestinal: Negative for nausea, diarrhea, constipation, blood in stool, abdominal distention, anal bleeding and rectal pain.  Genitourinary: Negative for dysuria, urgency, frequency, hematuria, flank pain, vaginal bleeding, vaginal discharge, difficulty urinating, genital sores and pelvic pain.  Musculoskeletal: Negative for back pain, joint swelling, arthralgias, gait problem, neck pain and neck stiffness.  Skin: Negative.  Negative for rash.  Neurological: Negative for dizziness, tremors, seizures, syncope,  speech difficulty, weakness, numbness and headaches.  Hematological: Negative for adenopathy. Does not bruise/bleed easily.  Psychiatric/Behavioral: Negative for suicidal ideas, behavioral problems, sleep disturbance, dysphoric mood and decreased concentration. The patient is nervous/anxious.        Objective:   Physical Exam  Constitutional: She appears well-developed. No distress.  obese  HENT:  Head: Normocephalic.  Right Ear: External ear normal.  Left Ear: External ear normal.  Nose: Nose normal.  Mouth/Throat: Oropharynx is clear and moist.  No halitosis  Eyes: Conjunctivae are normal. Pupils are equal, round, and reactive to light. Right eye exhibits no discharge. Left eye exhibits no discharge.  Neck: Normal range of motion. Neck supple. No JVD present. No tracheal deviation present. No thyromegaly present.  Cardiovascular: Normal rate, regular rhythm and normal heart sounds.   occ irreg beats  Pulmonary/Chest: No stridor. No respiratory distress. She has no wheezes.  Abdominal: Soft. Bowel sounds are normal. She exhibits no distension and no mass. There is no tenderness. There is no rebound and no guarding.  Musculoskeletal: She exhibits no edema or tenderness.  Lymphadenopathy:    She has no cervical adenopathy.  Neurological: She displays normal reflexes. No cranial nerve deficit. She exhibits normal muscle tone. Coordination normal.  Skin: No rash noted. No erythema.  Psychiatric: She has a normal mood and affect. Her behavior is normal. Judgment and thought content normal.  R shoulder is tender w/ROM, restricted in ROM LEs w/marginal weakness, NT. Pt is slow due to age.   Lab Results  Component Value Date   WBC 5.8 10/01/2014   HGB 14.6 10/01/2014   HCT 42.5 10/01/2014   PLT 171.0 10/01/2014  GLUCOSE 105* 10/01/2014   CHOL 191 10/01/2014   TRIG 72.0 10/01/2014   HDL 57.80 10/01/2014   LDLCALC 119* 10/01/2014   ALT 14 10/01/2014   AST 18 10/01/2014   NA 134*  10/01/2014   K 4.2 10/01/2014   CL 97 10/01/2014   CREATININE 0.85 10/01/2014   BUN 14 10/01/2014   CO2 35* 10/01/2014   TSH 2.19 10/01/2014   INR 1.0 10/07/2008   HGBA1C  10/07/2008    5.9 (NOTE)   The ADA recommends the following therapeutic goal for glycemic   control related to Hgb A1C measurement:   Goal of Therapy:   < 7.0% Hgb A1C   Reference: American Diabetes Association: Clinical Practice   Recommendations 2008, Diabetes Care,  2008, 31:(Suppl 1).   MICROALBUR 0.4 07/14/2009         Assessment & Plan:

## 2014-12-14 NOTE — Patient Instructions (Signed)
Hold Simvastatin x 2 weeks, then start 1/2 tab a day to see if leg weakness would get better

## 2014-12-28 ENCOUNTER — Telehealth: Payer: Self-pay | Admitting: Internal Medicine

## 2014-12-28 MED ORDER — CIPROFLOXACIN HCL 250 MG PO TABS: 250.0000 mg | ORAL_TABLET | Freq: Every day | ORAL | Status: DC

## 2014-12-28 NOTE — Telephone Encounter (Signed)
Ok Thx 

## 2014-12-28 NOTE — Telephone Encounter (Signed)
Patient states that she has a bladder infection and does not feel like she can come in. She requests that you call in an antibiotic to pharmacy on file. Please advise patient

## 2014-12-28 NOTE — Telephone Encounter (Signed)
Left detailed mess informing pt.  

## 2015-01-06 ENCOUNTER — Ambulatory Visit (HOSPITAL_BASED_OUTPATIENT_CLINIC_OR_DEPARTMENT_OTHER): Payer: Medicare Other

## 2015-01-06 VITALS — BP 125/56 | HR 61 | Temp 98.8°F | Resp 18

## 2015-01-06 DIAGNOSIS — C9110 Chronic lymphocytic leukemia of B-cell type not having achieved remission: Secondary | ICD-10-CM

## 2015-01-06 DIAGNOSIS — D801 Nonfamilial hypogammaglobulinemia: Secondary | ICD-10-CM | POA: Diagnosis not present

## 2015-01-06 MED ORDER — DIPHENHYDRAMINE HCL 25 MG PO CAPS
25.0000 mg | ORAL_CAPSULE | Freq: Once | ORAL | Status: AC
  Administered 2015-01-06: 25 mg via ORAL

## 2015-01-06 MED ORDER — DIPHENHYDRAMINE HCL 25 MG PO CAPS
ORAL_CAPSULE | ORAL | Status: AC
  Filled 2015-01-06: qty 1

## 2015-01-06 MED ORDER — IMMUNE GLOBULIN (HUMAN) 10 GM/100ML IV SOLN
30.0000 g | Freq: Once | INTRAVENOUS | Status: AC
  Administered 2015-01-06: 30 g via INTRAVENOUS
  Filled 2015-01-06: qty 300

## 2015-01-06 NOTE — Patient Instructions (Signed)

## 2015-01-14 ENCOUNTER — Encounter: Payer: Self-pay | Admitting: Internal Medicine

## 2015-01-14 ENCOUNTER — Ambulatory Visit (INDEPENDENT_AMBULATORY_CARE_PROVIDER_SITE_OTHER): Payer: Medicare Other | Admitting: Internal Medicine

## 2015-01-14 ENCOUNTER — Other Ambulatory Visit: Payer: Medicare Other

## 2015-01-14 VITALS — BP 154/82 | HR 66 | Temp 98.3°F | Resp 16 | Wt 142.0 lb

## 2015-01-14 DIAGNOSIS — R300 Dysuria: Secondary | ICD-10-CM

## 2015-01-14 DIAGNOSIS — R3915 Urgency of urination: Secondary | ICD-10-CM

## 2015-01-14 DIAGNOSIS — R350 Frequency of micturition: Secondary | ICD-10-CM | POA: Diagnosis not present

## 2015-01-14 LAB — POCT URINALYSIS DIPSTICK
Bilirubin, UA: NEGATIVE
Blood, UA: NEGATIVE
Glucose, UA: NEGATIVE
KETONES UA: NEGATIVE
LEUKOCYTES UA: NEGATIVE
Nitrite, UA: NEGATIVE
PH UA: 6
Protein, UA: NEGATIVE
SPEC GRAV UA: 1.015
UROBILINOGEN UA: 0.2

## 2015-01-14 NOTE — Progress Notes (Signed)
   Subjective:    Patient ID: [name], female    DOB: 05-14-1926, 81 y.o.   MRN: [id number]  HPI  Her symptoms began 2 days ago as frequency, urgency, dysuria, left flank pain. She's had no vaginal symptoms.  She feels she may have had a low-grade fever.  In February this year she had Escherichia coli urinary tract infection. She had received Bactrim for 3 days and subsequently was treated with Cipro 250 twice a day for 10 days.   Her history includes CLL; she receiving treatment monthly at the cancer center.  Review of Systems She denies vaginal discharge, chills, sweats, pyuria, or hematuria.    Objective:   Physical Exam Pertinent or positive findings include:Upper & lower partials. Decreased pedal pulses. General appearance :adequately nourished; in no distress.  Eyes: No conjunctival inflammation or scleral icterus is present.  Oral exam:  Lips and gums are healthy appearing.There is no oropharyngeal erythema or exudate noted. Dental hygiene is good.  Heart:  Normal rate and regular rhythm. S1 and S2 normal without gallop, murmur, click, rub or other extra sounds    Lungs:Chest clear to auscultation; no wheezes, rhonchi,rales ,or rubs present.No increased work of breathing.   Abdomen: bowel sounds normal, soft and non-tender without masses, organomegaly or hernias noted.  No guarding or rebound. No flank tenderness to percussion.  Vascular : all pulses equal ; no bruits present.  Skin:Warm & dry.  Intact without suspicious lesions or rashes ; no tenting or jaundice   Lymphatic: No lymphadenopathy is noted about the head, neck, axilla  Neuro: Strength, tone decreased       Assessment & Plan:  #1 frequency, urgency, dysuria, flank pain. Normal urinalysis. Escherichia coli urinary tract infection in February 2016.  Plan: Culture and sensitivity. See AVS

## 2015-01-14 NOTE — Progress Notes (Signed)
Pre visit review using our clinic review tool, if applicable. No additional management support is needed unless otherwise documented below in the visit note. 

## 2015-01-14 NOTE — Patient Instructions (Signed)
Drink as much nondairy fluids as possible. Avoid spicy foods or alcohol as  these may aggravate the bladder. Do not take decongestants. Avoid narcotics if possible. 

## 2015-01-16 LAB — URINE CULTURE
COLONY COUNT: NO GROWTH
ORGANISM ID, BACTERIA: NO GROWTH

## 2015-01-18 ENCOUNTER — Other Ambulatory Visit: Payer: Self-pay | Admitting: Internal Medicine

## 2015-01-18 ENCOUNTER — Telehealth: Payer: Self-pay | Admitting: Internal Medicine

## 2015-01-18 DIAGNOSIS — IMO0001 Reserved for inherently not codable concepts without codable children: Secondary | ICD-10-CM

## 2015-01-18 DIAGNOSIS — R300 Dysuria: Secondary | ICD-10-CM

## 2015-01-18 NOTE — Telephone Encounter (Signed)
Patient is calling re: UA results. Per dr hoppers notes, no UTI is present. Patient is not sure what next step should be. Please call patient to advise./

## 2015-01-18 NOTE — Telephone Encounter (Signed)
Spoke with pt and informed her that a referral to Urology is next.

## 2015-01-27 ENCOUNTER — Ambulatory Visit (INDEPENDENT_AMBULATORY_CARE_PROVIDER_SITE_OTHER): Payer: Medicare Other | Admitting: Family

## 2015-01-27 ENCOUNTER — Telehealth: Payer: Self-pay | Admitting: Internal Medicine

## 2015-01-27 ENCOUNTER — Encounter: Payer: Self-pay | Admitting: Family

## 2015-01-27 VITALS — BP 150/88 | HR 71 | Temp 97.9°F | Resp 18 | Ht 64.0 in | Wt 144.8 lb

## 2015-01-27 DIAGNOSIS — R102 Pelvic and perineal pain: Secondary | ICD-10-CM | POA: Insufficient documentation

## 2015-01-27 DIAGNOSIS — R5382 Chronic fatigue, unspecified: Secondary | ICD-10-CM | POA: Diagnosis not present

## 2015-01-27 DIAGNOSIS — R300 Dysuria: Secondary | ICD-10-CM | POA: Diagnosis not present

## 2015-01-27 LAB — POCT URINALYSIS DIPSTICK
Bilirubin, UA: NEGATIVE
Glucose, UA: NEGATIVE
Ketones, UA: NEGATIVE
Leukocytes, UA: NEGATIVE
NITRITE UA: NEGATIVE
PROTEIN UA: NEGATIVE
RBC UA: NEGATIVE
Spec Grav, UA: 1.015
UROBILINOGEN UA: NEGATIVE
pH, UA: 6.5

## 2015-01-27 NOTE — Assessment & Plan Note (Signed)
Continues to experience weakness despite lowering dose of simvastatin. Hold simvastatin to see if there is any improvements, however patient has been on medication for many years, so unlikely source but cannot be ruled out. Her vitals are stable and she is eating well. Weakness may be related to the vaginal pain she is experiencing. Follow up with PCP as scheduled.

## 2015-01-27 NOTE — Assessment & Plan Note (Signed)
Vaginal pain of potential bladder origin. Discussed benefits of pelvic exam and inability to fix any bladder problems and patient declined. Previously referred to urology for infections, however in office UA negative for hematuria, nitrites and leukocytes. Refer to GYN for further evaluation and management.

## 2015-01-27 NOTE — Progress Notes (Signed)
Pre visit review using our clinic review tool, if applicable. No additional management support is needed unless otherwise documented below in the visit note. 

## 2015-01-27 NOTE — Progress Notes (Signed)
Subjective:    Patient ID: [name], female    DOB: [birth year], 81 y.o.   MRN: [id number]  Chief Complaint  Patient presents with  . Extremity Weakness    weak in her legs, says that she is having pain in her vaginal area feels like its up inside can not tell if its bladder or vaginal     HPI:  [name] is a 81 y.o. female with a PMH of diverticulosis, hypertension, gastritis, chronic lymphocytic leukemia who presents today for an acute office visit.  Associated symptoms of pain located in her vaginal area has been going on for about a week or more. Describes the pain as continuous and similar to burning. Severity of the pain is around a 7-8/10.  Modifying factors include miconazole which has not helped. She has previously been seen in the office for dysuria and was treated with Bactrim. Indicates that she may have had the same pain during the previous visit, but is not sure.   Allergies  Allergen Reactions  . Pneumococcal Vaccines Swelling and Rash    Pneumococcal Vaccine-23 valent  . Clarithromycin     REACTION: sore mouth  . Oxycodone-Aspirin     REACTION: horrible nightmares  . Propoxyphene N-Acetaminophen     REACTION: couldn't wake her up    Current Outpatient Prescriptions on File Prior to Visit  Medication Sig Dispense Refill  . acetaminophen (TYLENOL) 500 MG tablet Take 500 mg by mouth 2 (two) times daily.     Marland Kitchen amLODipine (NORVASC) 5 MG tablet TAKE 1 TABLET (5 MG TOTAL) BY MOUTH DAILY. 30 tablet 11  . aspirin 81 MG EC tablet Take 81 mg by mouth daily.      . B-D 3CC LUER-LOK SYR 25GX1" 25G X 1" 3 ML MISC USE TO ADMINISTER B12 INJECTIONS 10 each 2  . B-D INS SYR ULTRAFINE 1CC/30G 30G X 1/2" 1 ML MISC USE TO ADMINISTER B12 INJECTIONS 10 each 1  . Cholecalciferol (EQL VITAMIN D3) 1000 UNITS tablet Take 1,000 Units by mouth daily.      . ciprofloxacin (CIPRO) 250 MG tablet Take 1 tablet (250 mg total) by mouth daily with breakfast. 5 tablet 0  . cyanocobalamin  (,VITAMIN B-12,) 1000 MCG/ML injection ADMINISTER 1 CC SQ EVERY 4 WEEKS 10 mL 0  . folic acid (FOLVITE) 1 MG tablet Take 1 mg by mouth daily. For restless legs    . gabapentin (NEURONTIN) 300 MG capsule TAKE ONE CAPSULE BY MOUTH EVERY DAY 90 capsule 2  . Ginseng 250 MG CAPS Take 1 capsule by mouth daily.     . Grape Seed Extract 100 MG CAPS Take 1 capsule by mouth daily.     . hydrochlorothiazide (MICROZIDE) 12.5 MG capsule TAKE ONE CAPSULE BY MOUTH EVERY DAY FOR BLOOD PRESSURE 90 capsule 3  . Insulin Syringe-Needle U-100 (B-D INS SYRINGE 0.5CC/30GX1/2") 30G X 1/2" 0.5 ML MISC by Does not apply route. To administer B12 injections     . KLOR-CON M10 10 MEQ tablet TAKE 1 TABLET EVERY DAY 30 tablet 5  . Lactobacillus (ACIDOPHILUS PO) Take by mouth daily.    Marland Kitchen latanoprost (XALATAN) 0.005 % ophthalmic solution     . loratadine (CLARITIN) 10 MG tablet TAKE 1 TABLET BY MOUTH EVERY DAY 30 tablet 5  . omeprazole (PRILOSEC) 40 MG capsule TAKE 1 CAPSULE (40 MG TOTAL) BY MOUTH DAILY. 90 capsule 2  . phenazopyridine (PYRIDIUM) 95 MG tablet Take 1 tablet (95 mg total) by mouth  3 (three) times daily as needed for pain. 10 tablet 0  . simvastatin (ZOCOR) 20 MG tablet TAKE 1 TABLET (20 MG TOTAL) BY MOUTH DAILY. (Patient taking differently: TAKE 1 TABLET (20 MG TOTAL) BY MOUTH EVERY OTHER DAY .) 90 tablet 0  . travoprost, benzalkonium, (TRAVATAN) 0.004 % ophthalmic solution 1 drop at bedtime.       No current facility-administered medications on file prior to visit.    Review of Systems  Constitutional: Negative for fever and chills.  Genitourinary: Positive for frequency and vaginal pain. Negative for hematuria.      Objective:    BP 150/88 mmHg  Pulse 71  Temp(Src) 97.9 F (36.6 C) (Oral)  Resp 18  Ht 5\' 4"  (1.626 m)  Wt 144 lb 12.8 oz (65.681 kg)  BMI 24.84 kg/m2  SpO2 97% Nursing note and vital signs reviewed.  Physical Exam  Constitutional: She is oriented to person, place, and time. She  appears well-developed and well-nourished. No distress.  Cardiovascular: Normal rate, regular rhythm, normal heart sounds and intact distal pulses.   Pulmonary/Chest: Effort normal and breath sounds normal.  Neurological: She is alert and oriented to person, place, and time.  Skin: Skin is warm and dry.  Psychiatric: She has a normal mood and affect. Her behavior is normal. Judgment and thought content normal.       Assessment & Plan:   Problem List Items Addressed This Visit      Other   Chronic fatigue    Continues to experience weakness despite lowering dose of simvastatin. Hold simvastatin to see if there is any improvements, however patient has been on medication for many years, so unlikely source but cannot be ruled out. Her vitals are stable and she is eating well. Weakness may be related to the vaginal pain she is experiencing. Follow up with PCP as scheduled.       Vaginal pain - Primary    Vaginal pain of potential bladder origin. Discussed benefits of pelvic exam and inability to fix any bladder problems and patient declined. Previously referred to urology for infections, however in office UA negative for hematuria, nitrites and leukocytes. Refer to GYN for further evaluation and management.       Relevant Orders   Ambulatory referral to Gynecology    Other Visit Diagnoses    Dysuria        Relevant Orders    POCT urinalysis dipstick (Completed)

## 2015-01-27 NOTE — Telephone Encounter (Signed)
Just wanted to let you know----patient is being seen today with greg calone at 4:15

## 2015-01-27 NOTE — Patient Instructions (Addendum)
Thank you for choosing [facility].  Summary/Instructions:  STOP taking the simvastatin. Follow up with Dr. Alain Marion.  A referral has been placed to gynecology.   If your symptoms worsen or fail to improve, please contact our office for further instruction, or in case of emergency go directly to the emergency room at the closest medical facility.

## 2015-01-27 NOTE — Telephone Encounter (Signed)
[facility] Patient Name: [name] DOB: [birth year] Initial Comment Caller states she is having some weakness and she is hurting down there. Nurse Assessment Nurse: Markus Daft, RN, Sherre Poot Date/Time (Eastern Time): 01/27/2015 1:31:28 PM Confirm and document reason for call. If symptomatic, describe symptoms. ---Caller states she is having some generalized weakness and pain with urination and almost all the time in the vaginal area. No rash. Last seen a week ago for suspected UTI, but urine tested and was not a UTI. S/S are not changed, but feel like getting worse. -- MD had taken off of some of her cholesterol medicine, and resumed it at 1/2 the dose. She had been on it for years. Denies muscle pains. No fever. Has the patient traveled out of the country within the last 30 days? ---Not Applicable Does the patient require triage? ---Yes Related visit to physician within the last 2 weeks? ---Yes Does the PT have any chronic conditions? (i.e. diabetes, asthma, etc.) ---Yes List chronic conditions. ---High cholesterol, HTN, Leukemia (CLL) - IV treatments monthly (not chemo, and no radiation) Guidelines Guideline Title Affirmed Question Affirmed Notes Weakness (Generalized) and Fatigue [1] MODERATE weakness (i.e., interferes with work, school, normal activities) AND [2] persists > 3 days Final Disposition User See Physician within [address] Hours [place], Therapist, sports, [place] Appt made for today at 4:15 pm with Dr. Elna Breslow

## 2015-01-29 ENCOUNTER — Ambulatory Visit: Payer: Medicare Other | Admitting: Internal Medicine

## 2015-01-29 ENCOUNTER — Encounter: Payer: Self-pay | Admitting: Internal Medicine

## 2015-02-03 ENCOUNTER — Telehealth: Payer: Self-pay | Admitting: Oncology

## 2015-02-03 ENCOUNTER — Other Ambulatory Visit (HOSPITAL_BASED_OUTPATIENT_CLINIC_OR_DEPARTMENT_OTHER): Payer: Medicare Other

## 2015-02-03 ENCOUNTER — Ambulatory Visit (HOSPITAL_BASED_OUTPATIENT_CLINIC_OR_DEPARTMENT_OTHER): Payer: Medicare Other | Admitting: Nurse Practitioner

## 2015-02-03 ENCOUNTER — Ambulatory Visit (HOSPITAL_BASED_OUTPATIENT_CLINIC_OR_DEPARTMENT_OTHER): Payer: Medicare Other

## 2015-02-03 VITALS — BP 119/60 | HR 98 | Temp 98.5°F | Resp 18 | Ht 64.0 in | Wt 143.1 lb

## 2015-02-03 VITALS — BP 128/64 | HR 69 | Temp 97.7°F | Resp 18

## 2015-02-03 DIAGNOSIS — Z87440 Personal history of urinary (tract) infections: Secondary | ICD-10-CM

## 2015-02-03 DIAGNOSIS — D801 Nonfamilial hypogammaglobulinemia: Secondary | ICD-10-CM | POA: Diagnosis not present

## 2015-02-03 DIAGNOSIS — C9110 Chronic lymphocytic leukemia of B-cell type not having achieved remission: Secondary | ICD-10-CM

## 2015-02-03 DIAGNOSIS — R531 Weakness: Secondary | ICD-10-CM | POA: Diagnosis not present

## 2015-02-03 LAB — COMPREHENSIVE METABOLIC PANEL (CC13)
ALBUMIN: 3.9 g/dL (ref 3.5–5.0)
ALT: 15 U/L (ref 0–55)
AST: 20 U/L (ref 5–34)
Alkaline Phosphatase: 60 U/L (ref 40–150)
Anion Gap: 9 mEq/L (ref 3–11)
BUN: 17.7 mg/dL (ref 7.0–26.0)
CALCIUM: 9.9 mg/dL (ref 8.4–10.4)
CHLORIDE: 102 meq/L (ref 98–109)
CO2: 27 mEq/L (ref 22–29)
Creatinine: 0.8 mg/dL (ref 0.6–1.1)
EGFR: 66 mL/min/{1.73_m2} — ABNORMAL LOW (ref >90)
Glucose: 106 mg/dl (ref 70–140)
Potassium: 3.9 mEq/L (ref 3.5–5.1)
Sodium: 137 mEq/L (ref 136–145)
Total Bilirubin: 0.46 mg/dL (ref 0.20–1.20)
Total Protein: 7.6 g/dL (ref 6.4–8.3)

## 2015-02-03 LAB — CBC WITH DIFFERENTIAL/PLATELET
BASO%: 0.7 % (ref 0.0–2.0)
Basophils Absolute: 0 10*3/uL (ref 0.0–0.1)
EOS%: 1.4 % (ref 0.0–7.0)
Eosinophils Absolute: 0.1 10*3/uL (ref 0.0–0.5)
HCT: 41.8 % (ref 34.8–46.6)
HGB: 14.1 g/dL (ref 11.6–15.9)
LYMPH#: 2.1 10*3/uL (ref 0.9–3.3)
LYMPH%: 37.7 % (ref 14.0–49.7)
MCH: 31.6 pg (ref 25.1–34.0)
MCHC: 33.6 g/dL (ref 31.5–36.0)
MCV: 94 fL (ref 79.5–101.0)
MONO#: 0.4 10*3/uL (ref 0.1–0.9)
MONO%: 7 % (ref 0.0–14.0)
NEUT%: 53.2 % (ref 38.4–76.8)
NEUTROS ABS: 3 10*3/uL (ref 1.5–6.5)
Platelets: 165 10*3/uL (ref 145–400)
RBC: 4.45 10*6/uL (ref 3.70–5.45)
RDW: 13.5 % (ref 11.2–14.5)
WBC: 5.6 10*3/uL (ref 3.9–10.3)

## 2015-02-03 LAB — LACTATE DEHYDROGENASE (CC13): LDH: 187 U/L (ref 125–245)

## 2015-02-03 MED ORDER — DIPHENHYDRAMINE HCL 25 MG PO CAPS
ORAL_CAPSULE | ORAL | Status: AC
  Filled 2015-02-03: qty 1

## 2015-02-03 MED ORDER — DIPHENHYDRAMINE HCL 25 MG PO CAPS
25.0000 mg | ORAL_CAPSULE | Freq: Once | ORAL | Status: AC
  Administered 2015-02-03: 25 mg via ORAL

## 2015-02-03 MED ORDER — SODIUM CHLORIDE 0.9 % IV SOLN
INTRAVENOUS | Status: DC
  Administered 2015-02-03: 11:00:00 via INTRAVENOUS

## 2015-02-03 MED ORDER — IMMUNE GLOBULIN (HUMAN) 10 GM/100ML IV SOLN
30.0000 g | Freq: Once | INTRAVENOUS | Status: AC
  Administered 2015-02-03: 30 g via INTRAVENOUS
  Filled 2015-02-03: qty 300

## 2015-02-03 NOTE — Progress Notes (Signed)
  [place] OFFICE PROGRESS NOTE   Diagnosis:  CLL  INTERVAL HISTORY:   Ms. Ivins returns as scheduled. She continues monthly IVIG. She reports progressive generalized weakness over the past month. Simvastatin was discontinued with no improvement. She denies any fevers or sweats. She has a good appetite. Her weight is stable. She had a recent urinary tract infection. No other infections. She denies shortness of breath. She had a recent episode of mild nausea. No vomiting. Bowels moving regularly.  Objective:  Vital signs in last 24 hours:  Blood pressure 119/60, pulse 98, temperature 98.5 F (36.9 C), temperature source Oral, resp. rate 18, height 5\' 4"  (1.626 m), weight 143 lb 1.6 oz (64.91 kg), SpO2 98 %.    HEENT: No thrush or ulcers. Lymphatics: No palpable cervical, supra clavicular, axillary or inguinal lymph nodes. Resp: Lungs clear bilaterally. Cardio: Regular rate and rhythm. GI: Abdomen soft. No organomegaly. Vascular: No leg edema. Calves soft and nontender.   Lab Results:  Lab Results  Component Value Date   WBC 5.6 02/03/2015   HGB 14.1 02/03/2015   HCT 41.8 02/03/2015   MCV 94.0 02/03/2015   PLT 165 02/03/2015   NEUTROABS 3.0 02/03/2015    Imaging:  No results found.  Medications: I have reviewed the patient's current medications.  Assessment/Plan: 1. Chronic lymphocytic leukemia, asymptomatic. 2. History of hypogammaglobulinemia with recurrent infections. She continues monthly IVIG replacement therapy. 3. History of recurrent urinary tract infections. Escherichia coli urinary tract infection 09/15/2014 4. History of mild thrombocytopenia secondary to CLL.   Disposition: Ms. Crenshaw remains stable from a hematologic standpoint. The plan is to continue monthly IVIG.  She is experiencing nonspecific weakness. We will obtain a chemistry panel and LDH today. She has a follow-up appointment with Dr. Alain Marion next week.  She will return  for a follow-up visit here in 3 months. She will contact the office in the interim with any problems.  Plan reviewed with Dr. Benay Spice.  Ned Card ANP/GNP-BC   02/03/2015  9:44 AM

## 2015-02-03 NOTE — Telephone Encounter (Signed)
lvm for pt regarding to Aug thru Oct appt....pt will get new sched in chemo

## 2015-02-04 LAB — IGG, IGA, IGM
IGA: 283 mg/dL (ref 69–380)
IGG (IMMUNOGLOBIN G), SERUM: 1230 mg/dL (ref 690–1700)
IGM, SERUM: 41 mg/dL — AB (ref 52–322)

## 2015-02-05 ENCOUNTER — Other Ambulatory Visit: Payer: Self-pay | Admitting: Internal Medicine

## 2015-02-06 ENCOUNTER — Other Ambulatory Visit: Payer: Self-pay | Admitting: Internal Medicine

## 2015-02-07 ENCOUNTER — Inpatient Hospital Stay (HOSPITAL_COMMUNITY)
Admission: EM | Admit: 2015-02-07 | Discharge: 2015-02-09 | DRG: 641 | Disposition: A | Discharge status: Home Health | Payer: Medicare Other | Attending: Family Medicine | Admitting: Family Medicine

## 2015-02-07 ENCOUNTER — Encounter (HOSPITAL_COMMUNITY): Payer: Self-pay | Admitting: Emergency Medicine

## 2015-02-07 ENCOUNTER — Emergency Department (HOSPITAL_COMMUNITY): Payer: Medicare Other

## 2015-02-07 DIAGNOSIS — K219 Gastro-esophageal reflux disease without esophagitis: Secondary | ICD-10-CM | POA: Diagnosis present

## 2015-02-07 DIAGNOSIS — Z90710 Acquired absence of both cervix and uterus: Secondary | ICD-10-CM

## 2015-02-07 DIAGNOSIS — E871 Hypo-osmolality and hyponatremia: Secondary | ICD-10-CM | POA: Diagnosis present

## 2015-02-07 DIAGNOSIS — Z79899 Other long term (current) drug therapy: Secondary | ICD-10-CM

## 2015-02-07 DIAGNOSIS — E872 Acidosis: Secondary | ICD-10-CM | POA: Diagnosis present

## 2015-02-07 DIAGNOSIS — Z885 Allergy status to narcotic agent status: Secondary | ICD-10-CM

## 2015-02-07 DIAGNOSIS — D801 Nonfamilial hypogammaglobulinemia: Secondary | ICD-10-CM | POA: Diagnosis present

## 2015-02-07 DIAGNOSIS — Z881 Allergy status to other antibiotic agents status: Secondary | ICD-10-CM

## 2015-02-07 DIAGNOSIS — Z7982 Long term (current) use of aspirin: Secondary | ICD-10-CM

## 2015-02-07 DIAGNOSIS — Z803 Family history of malignant neoplasm of breast: Secondary | ICD-10-CM

## 2015-02-07 DIAGNOSIS — E785 Hyperlipidemia, unspecified: Secondary | ICD-10-CM | POA: Diagnosis present

## 2015-02-07 DIAGNOSIS — C9110 Chronic lymphocytic leukemia of B-cell type not having achieved remission: Secondary | ICD-10-CM | POA: Diagnosis present

## 2015-02-07 DIAGNOSIS — E860 Dehydration: Secondary | ICD-10-CM | POA: Diagnosis not present

## 2015-02-07 DIAGNOSIS — R110 Nausea: Secondary | ICD-10-CM

## 2015-02-07 DIAGNOSIS — R531 Weakness: Secondary | ICD-10-CM | POA: Diagnosis not present

## 2015-02-07 DIAGNOSIS — Z823 Family history of stroke: Secondary | ICD-10-CM

## 2015-02-07 DIAGNOSIS — Z8249 Family history of ischemic heart disease and other diseases of the circulatory system: Secondary | ICD-10-CM

## 2015-02-07 DIAGNOSIS — R12 Heartburn: Secondary | ICD-10-CM | POA: Diagnosis present

## 2015-02-07 DIAGNOSIS — Z8049 Family history of malignant neoplasm of other genital organs: Secondary | ICD-10-CM

## 2015-02-07 DIAGNOSIS — Z887 Allergy status to serum and vaccine status: Secondary | ICD-10-CM

## 2015-02-07 DIAGNOSIS — I10 Essential (primary) hypertension: Secondary | ICD-10-CM | POA: Diagnosis present

## 2015-02-07 DIAGNOSIS — R070 Pain in throat: Secondary | ICD-10-CM | POA: Diagnosis present

## 2015-02-07 LAB — CBC WITH DIFFERENTIAL/PLATELET
Basophils Absolute: 0 10*3/uL (ref 0.0–0.1)
Basophils Relative: 0 % (ref 0–1)
Eosinophils Absolute: 0 10*3/uL (ref 0.0–0.7)
Eosinophils Relative: 0 % (ref 0–5)
HCT: 38.2 % (ref 36.0–46.0)
Hemoglobin: 13.6 g/dL (ref 12.0–15.0)
Lymphocytes Relative: 38 % (ref 12–46)
Lymphs Abs: 1.4 10*3/uL (ref 0.7–4.0)
MCH: 31.6 pg (ref 26.0–34.0)
MCHC: 35.6 g/dL (ref 30.0–36.0)
MCV: 88.6 fL (ref 78.0–100.0)
Monocytes Absolute: 0.3 10*3/uL (ref 0.1–1.0)
Monocytes Relative: 8 % (ref 3–12)
Neutro Abs: 2.1 10*3/uL (ref 1.7–7.7)
Neutrophils Relative %: 54 % (ref 43–77)
Platelets: 158 10*3/uL (ref 150–400)
RBC: 4.31 MIL/uL (ref 3.87–5.11)
RDW: 13.1 % (ref 11.5–15.5)
WBC: 3.8 10*3/uL — ABNORMAL LOW (ref 4.0–10.5)

## 2015-02-07 LAB — URINALYSIS, ROUTINE W REFLEX MICROSCOPIC
Bilirubin Urine: NEGATIVE
Glucose, UA: NEGATIVE mg/dL
Hgb urine dipstick: NEGATIVE
Ketones, ur: 15 mg/dL — AB
Nitrite: NEGATIVE
Protein, ur: NEGATIVE mg/dL
Specific Gravity, Urine: 1.008 (ref 1.005–1.030)
Urobilinogen, UA: 0.2 mg/dL (ref 0.0–1.0)
pH: 8 (ref 5.0–8.0)

## 2015-02-07 LAB — COMPREHENSIVE METABOLIC PANEL
ALT: 15 U/L (ref 14–54)
AST: 28 U/L (ref 15–41)
Albumin: 3.8 g/dL (ref 3.5–5.0)
Alkaline Phosphatase: 44 U/L (ref 38–126)
Anion gap: 12 (ref 5–15)
BUN: 16 mg/dL (ref 6–20)
CO2: 21 mmol/L — ABNORMAL LOW (ref 22–32)
Calcium: 9.2 mg/dL (ref 8.9–10.3)
Chloride: 92 mmol/L — ABNORMAL LOW (ref 101–111)
Creatinine, Ser: 0.87 mg/dL (ref 0.44–1.00)
GFR calc Af Amer: >60 mL/min (ref >60)
GFR calc non Af Amer: 59 mL/min — ABNORMAL LOW (ref >60)
Glucose, Bld: 122 mg/dL — ABNORMAL HIGH (ref 65–99)
Potassium: 3.6 mmol/L (ref 3.5–5.1)
Sodium: 125 mmol/L — ABNORMAL LOW (ref 135–145)
Total Bilirubin: 0.8 mg/dL (ref 0.3–1.2)
Total Protein: 7.7 g/dL (ref 6.5–8.1)

## 2015-02-07 LAB — URINE MICROSCOPIC-ADD ON

## 2015-02-07 LAB — I-STAT TROPONIN, ED: Troponin i, poc: 0 ng/mL (ref 0.00–0.08)

## 2015-02-07 MED ORDER — ONDANSETRON HCL 4 MG/2ML IJ SOLN
4.0000 mg | Freq: Once | INTRAMUSCULAR | Status: AC
  Administered 2015-02-07: 4 mg via INTRAVENOUS
  Filled 2015-02-07: qty 2

## 2015-02-07 MED ORDER — SODIUM CHLORIDE 0.9 % IV BOLUS (SEPSIS)
500.0000 mL | Freq: Once | INTRAVENOUS | Status: AC
  Administered 2015-02-07: 500 mL via INTRAVENOUS

## 2015-02-07 MED ORDER — GI COCKTAIL ~~LOC~~
30.0000 mL | Freq: Once | ORAL | Status: AC
  Administered 2015-02-08: 30 mL via ORAL
  Filled 2015-02-07: qty 30

## 2015-02-07 MED ORDER — PANTOPRAZOLE SODIUM 40 MG PO TBEC
40.0000 mg | DELAYED_RELEASE_TABLET | Freq: Once | ORAL | Status: AC
  Administered 2015-02-07: 40 mg via ORAL
  Filled 2015-02-07: qty 1

## 2015-02-07 NOTE — ED Notes (Signed)
Bed: XT05 Expected date:  Expected time:  Means of arrival:  Comments: EMS Reflux x 4-5days, 12L clear

## 2015-02-07 NOTE — ED Notes (Signed)
Patient transported to X-ray 

## 2015-02-07 NOTE — ED Notes (Signed)
Brought in by EMS from home with c/o heartburn for days now.  Pt reports that "reflux is getting worse that her throat hurt"; also c/o nausea, no abdominal pain or chest pain.

## 2015-02-08 ENCOUNTER — Encounter (HOSPITAL_COMMUNITY): Payer: Self-pay | Admitting: Internal Medicine

## 2015-02-08 ENCOUNTER — Observation Stay (HOSPITAL_COMMUNITY): Payer: Medicare Other

## 2015-02-08 DIAGNOSIS — Z885 Allergy status to narcotic agent status: Secondary | ICD-10-CM | POA: Diagnosis not present

## 2015-02-08 DIAGNOSIS — E872 Acidosis: Secondary | ICD-10-CM | POA: Diagnosis present

## 2015-02-08 DIAGNOSIS — E860 Dehydration: Secondary | ICD-10-CM | POA: Diagnosis present

## 2015-02-08 DIAGNOSIS — R070 Pain in throat: Secondary | ICD-10-CM | POA: Diagnosis present

## 2015-02-08 DIAGNOSIS — R12 Heartburn: Secondary | ICD-10-CM | POA: Diagnosis present

## 2015-02-08 DIAGNOSIS — Z79899 Other long term (current) drug therapy: Secondary | ICD-10-CM | POA: Diagnosis not present

## 2015-02-08 DIAGNOSIS — E785 Hyperlipidemia, unspecified: Secondary | ICD-10-CM | POA: Diagnosis present

## 2015-02-08 DIAGNOSIS — Z90710 Acquired absence of both cervix and uterus: Secondary | ICD-10-CM | POA: Diagnosis not present

## 2015-02-08 DIAGNOSIS — Z7982 Long term (current) use of aspirin: Secondary | ICD-10-CM | POA: Diagnosis not present

## 2015-02-08 DIAGNOSIS — I10 Essential (primary) hypertension: Secondary | ICD-10-CM | POA: Diagnosis present

## 2015-02-08 DIAGNOSIS — Z887 Allergy status to serum and vaccine status: Secondary | ICD-10-CM | POA: Diagnosis not present

## 2015-02-08 DIAGNOSIS — K219 Gastro-esophageal reflux disease without esophagitis: Secondary | ICD-10-CM | POA: Diagnosis present

## 2015-02-08 DIAGNOSIS — Z803 Family history of malignant neoplasm of breast: Secondary | ICD-10-CM | POA: Diagnosis not present

## 2015-02-08 DIAGNOSIS — R531 Weakness: Secondary | ICD-10-CM | POA: Diagnosis present

## 2015-02-08 DIAGNOSIS — C9110 Chronic lymphocytic leukemia of B-cell type not having achieved remission: Secondary | ICD-10-CM | POA: Diagnosis present

## 2015-02-08 DIAGNOSIS — E871 Hypo-osmolality and hyponatremia: Secondary | ICD-10-CM | POA: Diagnosis present

## 2015-02-08 DIAGNOSIS — Z8249 Family history of ischemic heart disease and other diseases of the circulatory system: Secondary | ICD-10-CM | POA: Diagnosis not present

## 2015-02-08 DIAGNOSIS — D801 Nonfamilial hypogammaglobulinemia: Secondary | ICD-10-CM | POA: Diagnosis present

## 2015-02-08 DIAGNOSIS — Z881 Allergy status to other antibiotic agents status: Secondary | ICD-10-CM | POA: Diagnosis not present

## 2015-02-08 DIAGNOSIS — Z8049 Family history of malignant neoplasm of other genital organs: Secondary | ICD-10-CM | POA: Diagnosis not present

## 2015-02-08 DIAGNOSIS — Z823 Family history of stroke: Secondary | ICD-10-CM | POA: Diagnosis not present

## 2015-02-08 LAB — BASIC METABOLIC PANEL
ANION GAP: 6 (ref 5–15)
BUN: 15 mg/dL (ref 6–20)
CALCIUM: 9.1 mg/dL (ref 8.9–10.3)
CHLORIDE: 98 mmol/L — AB (ref 101–111)
CO2: 27 mmol/L (ref 22–32)
Creatinine, Ser: 0.84 mg/dL (ref 0.44–1.00)
GFR calc Af Amer: >60 mL/min (ref >60)
GFR calc non Af Amer: >60 mL/min (ref >60)
Glucose, Bld: 118 mg/dL — ABNORMAL HIGH (ref 65–99)
Potassium: 3.6 mmol/L (ref 3.5–5.1)
Sodium: 131 mmol/L — ABNORMAL LOW (ref 135–145)

## 2015-02-08 LAB — CBC
HEMATOCRIT: 37.6 % (ref 36.0–46.0)
Hemoglobin: 13.1 g/dL (ref 12.0–15.0)
MCH: 31.1 pg (ref 26.0–34.0)
MCHC: 34.8 g/dL (ref 30.0–36.0)
MCV: 89.3 fL (ref 78.0–100.0)
PLATELETS: 155 10*3/uL (ref 150–400)
RBC: 4.21 MIL/uL (ref 3.87–5.11)
RDW: 13.3 % (ref 11.5–15.5)
WBC: 4.9 10*3/uL (ref 4.0–10.5)

## 2015-02-08 LAB — TROPONIN I
Troponin I: <0.03 ng/mL (ref <0.031)
Troponin I: <0.03 ng/mL (ref <0.031)

## 2015-02-08 LAB — HEPATIC FUNCTION PANEL
ALBUMIN: 3.6 g/dL (ref 3.5–5.0)
ALT: 15 U/L (ref 14–54)
AST: 21 U/L (ref 15–41)
Alkaline Phosphatase: 41 U/L (ref 38–126)
BILIRUBIN TOTAL: 0.6 mg/dL (ref 0.3–1.2)
TOTAL PROTEIN: 7.3 g/dL (ref 6.5–8.1)

## 2015-02-08 LAB — TSH: TSH: 2.629 u[IU]/mL (ref 0.350–4.500)

## 2015-02-08 MED ORDER — ASPIRIN EC 81 MG PO TBEC
81.0000 mg | DELAYED_RELEASE_TABLET | Freq: Every day | ORAL | Status: DC
  Administered 2015-02-08 – 2015-02-09 (×2): 81 mg via ORAL
  Filled 2015-02-08 (×2): qty 1

## 2015-02-08 MED ORDER — FENTANYL CITRATE (PF) 100 MCG/2ML IJ SOLN
25.0000 ug | Freq: Once | INTRAMUSCULAR | Status: AC
  Administered 2015-02-08: 25 ug via INTRAVENOUS
  Filled 2015-02-08: qty 2

## 2015-02-08 MED ORDER — AMLODIPINE BESYLATE 5 MG PO TABS
5.0000 mg | ORAL_TABLET | Freq: Every day | ORAL | Status: DC
  Administered 2015-02-08 – 2015-02-09 (×2): 5 mg via ORAL
  Filled 2015-02-08 (×2): qty 1

## 2015-02-08 MED ORDER — FLUCONAZOLE 200 MG PO TABS
200.0000 mg | ORAL_TABLET | Freq: Once | ORAL | Status: AC
  Administered 2015-02-08: 200 mg via ORAL
  Filled 2015-02-08: qty 1

## 2015-02-08 MED ORDER — HYDRALAZINE HCL 20 MG/ML IJ SOLN: 10.0000 mg | INTRAMUSCULAR | Status: DC | PRN

## 2015-02-08 MED ORDER — ACETAMINOPHEN 325 MG PO TABS
650.0000 mg | ORAL_TABLET | Freq: Four times a day (QID) | ORAL | Status: DC | PRN
  Administered 2015-02-09: 650 mg via ORAL
  Filled 2015-02-08 (×2): qty 2

## 2015-02-08 MED ORDER — ENOXAPARIN SODIUM 40 MG/0.4ML ~~LOC~~ SOLN
40.0000 mg | SUBCUTANEOUS | Status: DC
  Administered 2015-02-08 – 2015-02-09 (×2): 40 mg via SUBCUTANEOUS
  Filled 2015-02-08 (×2): qty 0.4

## 2015-02-08 MED ORDER — SUCRALFATE 1 GM/10ML PO SUSP
1.0000 g | Freq: Three times a day (TID) | ORAL | Status: DC
  Administered 2015-02-08 – 2015-02-09 (×5): 1 g via ORAL
  Filled 2015-02-08 (×9): qty 10

## 2015-02-08 MED ORDER — PANTOPRAZOLE SODIUM 40 MG IV SOLR
40.0000 mg | Freq: Two times a day (BID) | INTRAVENOUS | Status: DC
  Administered 2015-02-08 (×2): 40 mg via INTRAVENOUS
  Filled 2015-02-08 (×4): qty 40

## 2015-02-08 MED ORDER — ONDANSETRON HCL 4 MG PO TABS
4.0000 mg | ORAL_TABLET | Freq: Four times a day (QID) | ORAL | Status: DC | PRN
  Administered 2015-02-08 – 2015-02-09 (×3): 4 mg via ORAL
  Filled 2015-02-08 (×3): qty 1

## 2015-02-08 MED ORDER — SIMVASTATIN 10 MG PO TABS
10.0000 mg | ORAL_TABLET | Freq: Every day | ORAL | Status: DC
  Administered 2015-02-08 – 2015-02-09 (×2): 10 mg via ORAL
  Filled 2015-02-08 (×2): qty 1

## 2015-02-08 MED ORDER — FLUCONAZOLE 200 MG PO TABS
200.0000 mg | ORAL_TABLET | Freq: Once | ORAL | Status: DC
Start: 2015-02-08 — End: 2015-02-08
  Filled 2015-02-08: qty 1

## 2015-02-08 MED ORDER — LORATADINE 10 MG PO TABS
10.0000 mg | ORAL_TABLET | Freq: Every day | ORAL | Status: DC
  Administered 2015-02-08 – 2015-02-09 (×2): 10 mg via ORAL
  Filled 2015-02-08 (×2): qty 1

## 2015-02-08 MED ORDER — LATANOPROST 0.005 % OP SOLN
1.0000 [drp] | Freq: Every day | OPHTHALMIC | Status: DC
  Administered 2015-02-08: 1 [drp] via OPHTHALMIC
  Filled 2015-02-08: qty 2.5

## 2015-02-08 MED ORDER — SUCRALFATE 1 GM/10ML PO SUSP
1.0000 g | Freq: Once | ORAL | Status: AC
  Administered 2015-02-08: 1 g via ORAL
  Filled 2015-02-08: qty 10

## 2015-02-08 MED ORDER — LACTINEX PO CHEW
1.0000 | CHEWABLE_TABLET | Freq: Every day | ORAL | Status: DC
  Administered 2015-02-08 – 2015-02-09 (×2): 1 via ORAL
  Filled 2015-02-08 (×3): qty 1

## 2015-02-08 MED ORDER — FOLIC ACID 1 MG PO TABS
1.0000 mg | ORAL_TABLET | Freq: Every day | ORAL | Status: DC
  Administered 2015-02-08 – 2015-02-09 (×2): 1 mg via ORAL
  Filled 2015-02-08 (×2): qty 1

## 2015-02-08 MED ORDER — ONDANSETRON HCL 4 MG/2ML IJ SOLN
4.0000 mg | Freq: Four times a day (QID) | INTRAMUSCULAR | Status: DC | PRN
  Filled 2015-02-08: qty 2

## 2015-02-08 MED ORDER — SUCRALFATE 1 GM/10ML PO SUSP: 1.0000 g | Freq: Three times a day (TID) | ORAL | Status: DC

## 2015-02-08 MED ORDER — ACETAMINOPHEN 650 MG RE SUPP: 650.0000 mg | Freq: Four times a day (QID) | RECTAL | Status: DC | PRN

## 2015-02-08 MED ORDER — GABAPENTIN 300 MG PO CAPS
300.0000 mg | ORAL_CAPSULE | Freq: Every day | ORAL | Status: DC
  Administered 2015-02-08: 300 mg via ORAL
  Filled 2015-02-08 (×2): qty 1

## 2015-02-08 MED ORDER — SODIUM CHLORIDE 0.9 % IV SOLN
INTRAVENOUS | Status: AC
  Administered 2015-02-08 (×2): via INTRAVENOUS

## 2015-02-08 MED ORDER — ONDANSETRON HCL 4 MG/2ML IJ SOLN
4.0000 mg | Freq: Once | INTRAMUSCULAR | Status: AC
  Administered 2015-02-08: 4 mg via INTRAVENOUS
  Filled 2015-02-08: qty 2

## 2015-02-08 NOTE — H&P (Signed)
[facility] History and Physical  MAI LONGNECKER YCX:[id number] DOB: Apr 07, 1926 DOA: 02/07/2015  Referring physician: Mr. Chief Executive Officer. PCP: Walker Kehr, MD  Specialists: Dr. Learta Codding. Oncologist.  Chief Complaint: Nausea and heartburn.  HPI: [name] is a 81 y.o. female with history of CLL, hypogammaglobulinemia, hypertension, hyperlipidemia and B2 deficiency presents to the ER because of fatigue weakness and nausea with some heartburn and throat pain. Patient states she has been feeling weak and fatigued over the last 1 month and patient's primary care physician had decreased her statin dose and also place patient on ensure. Despite taking which patient was feeling weak. Over the last 4-5 days patient has been having some nausea with heartburn after she eats. Denies any shortness of breath fever chills or productive cough. Patient states she has not been eating well because of the nausea. In the ER labs revealed mild hyponatremia with metabolic acidosis. Despite giving GI cocktail patient still has the symptoms of heartburn and throat burning. Patient has been admitted for further management.   Review of Systems: As presented in the history of presenting illness, rest negative.  Past Medical History  Diagnosis Date  . Hyperlipidemia   . Hypertension   . Allergy     rhinitis  . Hx of colonic polyps   . Diverticulosis of colon   . Osteoarthritis   . Pernicious anemia   . Glaucoma   . Peripheral neuropathy   . Vitamin B12 deficiency   . Chronic ethmoidal sinusitis   . UTI (lower urinary tract infection) 2010  . Leukemia     Chronic lymphocytic leukemia  . Hypogammaglobulinemia     Monthly IVIG   Past Surgical History  Procedure Laterality Date  . Bilateral cataracts  2007  . Abdominal hysterectomy  1987  . Bladder tack  1987  . Ureter reconstruction  1992  . L4-l5 laminetomy  1999  . Tubaligation  1961   Social History:  reports that she has never smoked. She does  not have any smokeless tobacco history on file. She reports that she does not drink alcohol or use illicit drugs. Where does patient live at home. Can patient participate in ADLs? Yes.  Allergies  Allergen Reactions  . Pneumococcal Vaccines Swelling and Rash    Pneumococcal Vaccine-23 valent  . Clarithromycin Other (See Comments)    REACTION: sore mouth  . Oxycodone-Aspirin Other (See Comments)    REACTION: horrible nightmares  . Propoxyphene N-Acetaminophen Other (See Comments)    REACTION: couldn't wake her up    Family History:  Family History  Problem Relation Age of Onset  . Cancer Mother     uterine  . Jaundice Father   . Other Sister     brain tumor  . Stroke Son 44    in NH  . Hypertension Other   . Cancer Other     breast      Prior to Admission medications   Medication Sig Start Date End Date Taking? Authorizing Provider  acetaminophen (TYLENOL) 500 MG tablet Take 500 mg by mouth 2 (two) times daily.    Yes Historical Provider, MD  amLODipine (NORVASC) 5 MG tablet TAKE 1 TABLET (5 MG TOTAL) BY MOUTH DAILY. 11/09/14  Yes Aleksei Plotnikov V, MD  aspirin 81 MG EC tablet Take 81 mg by mouth daily.     Yes Historical Provider, MD  B-D 3CC LUER-LOK SYR 25GX1" 25G X 1" 3 ML MISC USE TO ADMINISTER B12 INJECTIONS 09/08/13  Yes Aleksei Plotnikov V,  MD  B-D INS SYR ULTRAFINE 1CC/30G 30G X 1/2" 1 ML MISC USE TO ADMINISTER B12 INJECTIONS 04/25/12  Yes Aleksei Plotnikov V, MD  Cholecalciferol (EQL VITAMIN D3) 1000 UNITS tablet Take 1,000 Units by mouth daily.     Yes Historical Provider, MD  cyanocobalamin (,VITAMIN B-12,) 1000 MCG/ML injection ADMINISTER 1 CC SQ EVERY 4 WEEKS 07/18/13  Yes Aleksei Plotnikov V, MD  folic acid (FOLVITE) 1 MG tablet Take 1 mg by mouth daily. For restless legs   Yes Historical Provider, MD  gabapentin (NEURONTIN) 300 MG capsule TAKE ONE CAPSULE BY MOUTH EVERY DAY Patient taking differently: TAKE ONE CAPSULE BY MOUTH at bedtime 11/02/14  Yes Aleksei  Plotnikov V, MD  Ginseng 250 MG CAPS Take 1 capsule by mouth daily.    Yes Historical Provider, MD  Grape Seed Extract 100 MG CAPS Take 1 capsule by mouth daily.    Yes Historical Provider, MD  hydrochlorothiazide (MICROZIDE) 12.5 MG capsule TAKE ONE CAPSULE BY MOUTH EVERY DAY FOR BLOOD PRESSURE 08/03/14  Yes Aleksei Plotnikov V, MD  Insulin Syringe-Needle U-100 (B-D INS SYRINGE 0.5CC/30GX1/2") 30G X 1/2" 0.5 ML MISC by Does not apply route. To administer B12 injections    Yes Historical Provider, MD  KLOR-CON M10 10 MEQ tablet TAKE 1 TABLET EVERY DAY 05/26/14  Yes Aleksei Plotnikov V, MD  lactobacillus acidophilus & bulgar (LACTINEX) chewable tablet Chew 1 tablet by mouth daily.   Yes Historical Provider, MD  latanoprost (XALATAN) 0.005 % ophthalmic solution Place 1 drop into both eyes at bedtime.  07/15/11  Yes Historical Provider, MD  loratadine (CLARITIN) 10 MG tablet TAKE 1 TABLET BY MOUTH EVERY DAY 10/13/14  Yes Aleksei Plotnikov V, MD  omeprazole (PRILOSEC) 40 MG capsule TAKE 1 CAPSULE (40 MG TOTAL) BY MOUTH DAILY. 02/05/15  Yes Aleksei Plotnikov V, MD  simvastatin (ZOCOR) 10 MG tablet Take 10 mg by mouth daily.   Yes Historical Provider, MD  phenazopyridine (PYRIDIUM) 95 MG tablet Take 1 tablet (95 mg total) by mouth 3 (three) times daily as needed for pain. Patient not taking: Reported on 02/07/2015 09/15/14   [place], FNP    Physical Exam: Filed Vitals:   02/08/15 0000 02/08/15 0030 02/08/15 0100 02/08/15 0232  BP: 146/65 138/71 126/74 122/57  Pulse:  66 66 59  Temp:    98.4 F (36.9 C)  TempSrc:    Oral  Resp: 13 17 23 17   Weight:    64 kg (141 lb 1.5 oz)  SpO2:  94% 93% 97%     General:  Moderately built and nourished.  Eyes: Anicteric no pallor.  ENT: No discharge from the ears eyes nose or mouth. Not see any coating on the tongue.  Neck: No mass felt.  Cardiovascular: S1 and S2 heard.  Respiratory: No rhonchi or crepitations.  Abdomen: Soft nontender bowel  sounds present.  Skin: No rash.  Musculoskeletal: No edema.  Psychiatric: Appears normal.  Neurologic: Alert awake oriented to time place and person. Moves all extremities.  Labs on Admission:  Basic Metabolic Panel:  Recent Labs Lab 02/03/15 1043 02/07/15 2304  NA 137 125*  K 3.9 3.6  CL  --  92*  CO2 27 21*  GLUCOSE 106 122*  BUN 17.7 16  CREATININE 0.8 0.87  CALCIUM 9.9 9.2   Liver Function Tests:  Recent Labs Lab 02/03/15 1043 02/07/15 2304  AST 20 28  ALT 15 15  ALKPHOS 60 44  BILITOT 0.46 0.8  PROT 7.6 7.7  ALBUMIN 3.9 3.8   No results for input(s): LIPASE, AMYLASE in the last 168 hours. No results for input(s): AMMONIA in the last 168 hours. CBC:  Recent Labs Lab 02/03/15 0909 02/07/15 2304  WBC 5.6 3.8*  NEUTROABS 3.0 2.1  HGB 14.1 13.6  HCT 41.8 38.2  MCV 94.0 88.6  PLT 165 158   Cardiac Enzymes: No results for input(s): CKTOTAL, CKMB, CKMBINDEX, TROPONINI in the last 168 hours.  BNP (last 3 results) No results for input(s): BNP in the last 8760 hours.  ProBNP (last 3 results) No results for input(s): PROBNP in the last 8760 hours.  CBG: No results for input(s): GLUCAP in the last 168 hours.  Radiological Exams on Admission: Dg Chest 2 View  02/07/2015   CLINICAL DATA:  81 year old female with chest pain  EXAM: CHEST  2 VIEW  COMPARISON:  Radiograph dated 10/07/2008  FINDINGS: Two views of the chest demonstrate emphysematous changes of the lungs. There is no focal consolidation, pleural effusion, or pneumothorax. The cardiomediastinal silhouette is within normal limits. The osseous structures are grossly unremarkable.  IMPRESSION: No active cardiopulmonary disease.   Electronically Signed   By: Anner Crete M.D.   On: 02/07/2015 23:01    EKG: Independently reviewed. Normal sinus rhythm.  Assessment/Plan Principal Problem:   Dehydration Active Problems:   Chronic lymphocytic leukemia   Essential hypertension   Pain in throat    Hyponatremia   1. Dehydration with hyponatremia and metabolic acidosis - probably from poor oral intake. Gently hydrate him closely follow metabolic panel. 2. Nausea with heartburn and throat burns - patient probably could be having GERD or esophagitis. I have placed patient on Carafate Protonix IV. Check sonogram of the abdomen specifically to check for any gallstones for symptomatic cholelithiasis. If symptoms persist may need GI consult. 3. Hypertension - continue home medications. 4. Hyperlipidemia - on statins. 5. CLL - per oncology. 6. Hypogammaglobulinemia - patient received IVIG last week. 7. History of thrombocytopenia presently normal platelet count.   DVT Prophylaxis Lovenox.  Code Status: Full code.  Family Communication: Discussed with patient.  Disposition Plan: Admit for observation.    Achaia Garlock N. [facility] Pager [phone].  If 7PM-7AM, please contact night-coverage [email or web address] Password [facility] 02/08/2015, 3:23 AM

## 2015-02-08 NOTE — ED Provider Notes (Signed)
CSN: [id number]     Arrival date & time 02/07/15  2154 History   First MD Initiated Contact with Patient 02/07/15 2226     Chief Complaint  Patient presents with  . Heartburn     (Consider location/radiation/quality/duration/timing/severity/associated sxs/prior Treatment) HPI Patient presents to the emergency department with burning in her throat since Wednesday.  Patient states she feels like she has acid reflux and burning her throat.  He should states that she does have some nausea associated with this.  Patient denies abdominal pain, chest pain, shortness of breath, weakness, dizziness, back pain, fever, diarrhea, vomiting, fever or syncope.  The patient states she took her normal omeprazole without relief of her symptoms Past Medical History  Diagnosis Date  . Hyperlipidemia   . Hypertension   . Allergy     rhinitis  . Hx of colonic polyps   . Diverticulosis of colon   . Osteoarthritis   . Pernicious anemia   . Glaucoma   . Peripheral neuropathy   . Vitamin B12 deficiency   . Chronic ethmoidal sinusitis   . UTI (lower urinary tract infection) 2010  . Leukemia     Chronic lymphocytic leukemia  . Hypogammaglobulinemia     Monthly IVIG   Past Surgical History  Procedure Laterality Date  . Bilateral cataracts  2007  . Abdominal hysterectomy  1987  . Bladder tack  1987  . Ureter reconstruction  1992  . L4-l5 laminetomy  1999  . Tubaligation  1961   Family History  Problem Relation Age of Onset  . Cancer Mother     uterine  . Jaundice Father   . Other Sister     brain tumor  . Stroke Son 33    in NH  . Hypertension Other   . Cancer Other     breast   History  Substance Use Topics  . Smoking status: Never Smoker   . Smokeless tobacco: Not on file  . Alcohol Use: No   OB History    No data available     Review of Systems All other systems negative except as documented in the HPI. All pertinent positives and negatives as reviewed in the HPI.   Allergies   Pneumococcal vaccines; Clarithromycin; Oxycodone-aspirin; and Propoxyphene n-acetaminophen  Home Medications   Prior to Admission medications   Medication Sig Start Date End Date Taking? Authorizing Provider  acetaminophen (TYLENOL) 500 MG tablet Take 500 mg by mouth 2 (two) times daily.    Yes Historical Provider, MD  amLODipine (NORVASC) 5 MG tablet TAKE 1 TABLET (5 MG TOTAL) BY MOUTH DAILY. 11/09/14  Yes Aleksei Plotnikov V, MD  aspirin 81 MG EC tablet Take 81 mg by mouth daily.     Yes Historical Provider, MD  B-D 3CC LUER-LOK SYR 25GX1" 25G X 1" 3 ML MISC USE TO ADMINISTER B12 INJECTIONS 09/08/13  Yes Aleksei Plotnikov V, MD  B-D INS SYR ULTRAFINE 1CC/30G 30G X 1/2" 1 ML MISC USE TO ADMINISTER B12 INJECTIONS 04/25/12  Yes Aleksei Plotnikov V, MD  Cholecalciferol (EQL VITAMIN D3) 1000 UNITS tablet Take 1,000 Units by mouth daily.     Yes Historical Provider, MD  cyanocobalamin (,VITAMIN B-12,) 1000 MCG/ML injection ADMINISTER 1 CC SQ EVERY 4 WEEKS 07/18/13  Yes Aleksei Plotnikov V, MD  folic acid (FOLVITE) 1 MG tablet Take 1 mg by mouth daily. For restless legs   Yes Historical Provider, MD  gabapentin (NEURONTIN) 300 MG capsule TAKE ONE CAPSULE BY MOUTH EVERY DAY Patient  taking differently: TAKE ONE CAPSULE BY MOUTH at bedtime 11/02/14  Yes Aleksei Plotnikov V, MD  Ginseng 250 MG CAPS Take 1 capsule by mouth daily.    Yes Historical Provider, MD  Grape Seed Extract 100 MG CAPS Take 1 capsule by mouth daily.    Yes Historical Provider, MD  hydrochlorothiazide (MICROZIDE) 12.5 MG capsule TAKE ONE CAPSULE BY MOUTH EVERY DAY FOR BLOOD PRESSURE 08/03/14  Yes Aleksei Plotnikov V, MD  Insulin Syringe-Needle U-100 (B-D INS SYRINGE 0.5CC/30GX1/2") 30G X 1/2" 0.5 ML MISC by Does not apply route. To administer B12 injections    Yes Historical Provider, MD  KLOR-CON M10 10 MEQ tablet TAKE 1 TABLET EVERY DAY 05/26/14  Yes Aleksei Plotnikov V, MD  lactobacillus acidophilus & bulgar (LACTINEX) chewable tablet  Chew 1 tablet by mouth daily.   Yes Historical Provider, MD  latanoprost (XALATAN) 0.005 % ophthalmic solution Place 1 drop into both eyes at bedtime.  07/15/11  Yes Historical Provider, MD  loratadine (CLARITIN) 10 MG tablet TAKE 1 TABLET BY MOUTH EVERY DAY 10/13/14  Yes Aleksei Plotnikov V, MD  omeprazole (PRILOSEC) 40 MG capsule TAKE 1 CAPSULE (40 MG TOTAL) BY MOUTH DAILY. 02/05/15  Yes Aleksei Plotnikov V, MD  simvastatin (ZOCOR) 10 MG tablet Take 10 mg by mouth daily.   Yes Historical Provider, MD  phenazopyridine (PYRIDIUM) 95 MG tablet Take 1 tablet (95 mg total) by mouth 3 (three) times daily as needed for pain. Patient not taking: Reported on 02/07/2015 09/15/14   Gold[place] Circle, FNP   BP 146/65 mmHg  Pulse 66  Temp(Src) 98.5 F (36.9 C) (Oral)  Resp 13  SpO2 99% Physical Exam  Constitutional: She is oriented to person, place, and time. She appears well-developed and well-nourished. No distress.  HENT:  Head: Normocephalic and atraumatic.  Mouth/Throat: Oropharynx is clear and moist.  Eyes: Pupils are equal, round, and reactive to light.  Neck: Normal range of motion. Neck supple.  Cardiovascular: Normal rate, regular rhythm and normal heart sounds.   Pulmonary/Chest: Effort normal and breath sounds normal. No respiratory distress. She has no wheezes.  Abdominal: Soft. Bowel sounds are normal. She exhibits no distension. There is no tenderness. There is no rebound.  Musculoskeletal: She exhibits no edema.  Neurological: She is alert and oriented to person, place, and time. She exhibits normal muscle tone. Coordination normal.  Skin: Skin is warm and dry. No rash noted. No erythema.  Vitals reviewed.   ED Course  Procedures (including critical care time) Labs Review Labs Reviewed  CBC WITH DIFFERENTIAL/PLATELET - Abnormal; Notable for the following:    WBC 3.8 (*)    All other components within normal limits  COMPREHENSIVE METABOLIC PANEL - Abnormal; Notable for the  following:    Sodium 125 (*)    Chloride 92 (*)    CO2 21 (*)    Glucose, Bld 122 (*)    GFR calc non Af Amer 59 (*)    All other components within normal limits  URINALYSIS, ROUTINE W REFLEX MICROSCOPIC (NOT AT [facility]) - Abnormal; Notable for the following:    APPearance CLOUDY (*)    Ketones, ur 15 (*)    Leukocytes, UA SMALL (*)    All other components within normal limits  URINE MICROSCOPIC-ADD ON  Randolm Idol, ED    Imaging Review Dg Chest 2 View  02/07/2015   CLINICAL DATA:  81 year old female with chest pain  EXAM: CHEST  2 VIEW  COMPARISON:  Radiograph dated 10/07/2008  FINDINGS: Two views of the chest demonstrate emphysematous changes of the lungs. There is no focal consolidation, pleural effusion, or pneumothorax. The cardiomediastinal silhouette is within normal limits. The osseous structures are grossly unremarkable.  IMPRESSION: No active cardiopulmonary disease.   Electronically Signed   By: Anner Crete M.D.   On: 02/07/2015 23:01     EKG Interpretation   Date/Time:  Sunday February 07 2015 23:21:24 EDT Ventricular Rate:  68 PR Interval:  323 QRS Duration: 91 QT Interval:  436 QTC Calculation: 464 R Axis:   26 Text Interpretation:  Sinus rhythm Prolonged PR interval Borderline low  voltage, extremity leads Confirmed by DELO  MD, DOUGLAS ([id number]) on  02/07/2015 11:26:49 PM        She states that she does not think she can go home.  I spoke with the [facility] who will admit her.  I do feel that there is possibility that she could have candidal infection of the esophagus due to the fact that she is on chemotherapy.  She is given treatment for this  Dalia Heading, PA-C 02/09/15 0053  Dorie Rank, MD 02/12/15 [phone]

## 2015-02-08 NOTE — Progress Notes (Signed)
Subjective: 81 year old female admitted this morning, see detailed H&P by Dr. Hal Hope. 89 are female with a history of CLL, hypertension, hyperlipidemia came to the ED with fatigue with weakness and nausea heartburn throat pain. Patient found to have dehydration with hyponatremia and metabolic acidosis from poor by mouth intake. Started on IV fluids. Filed Vitals:   02/08/15 1433  BP: 148/68  Pulse: 61  Temp: 98.7 F (37.1 C)  Resp: 18    Chest: Clear Bilaterally Heart : S1S2 RRR Abdomen: Soft, nontender Ext : No edema Neuro: Alert, oriented x 3  A/P  Dehydration GERD Hypertension Hyponatremia  Continue IV fluids, normal saline at 75 mL per hour Sodium 131 this morning   Oswald Hillock [facility] Pager- [phone]

## 2015-02-08 NOTE — Plan of Care (Signed)
Problem: Consults Goal: Nutrition Consult-if indicated Outcome: Not Met (add Reason) Pt with Nausea, no emesis. Pt is on full liquid diet

## 2015-02-09 ENCOUNTER — Ambulatory Visit: Payer: Self-pay | Admitting: Gynecology

## 2015-02-09 DIAGNOSIS — E860 Dehydration: Principal | ICD-10-CM

## 2015-02-09 DIAGNOSIS — I10 Essential (primary) hypertension: Secondary | ICD-10-CM

## 2015-02-09 DIAGNOSIS — C9110 Chronic lymphocytic leukemia of B-cell type not having achieved remission: Secondary | ICD-10-CM

## 2015-02-09 DIAGNOSIS — E871 Hypo-osmolality and hyponatremia: Secondary | ICD-10-CM

## 2015-02-09 LAB — TROPONIN I: Troponin I: <0.03 ng/mL (ref <0.031)

## 2015-02-09 MED ORDER — PANTOPRAZOLE SODIUM 40 MG PO TBEC: 40.0000 mg | DELAYED_RELEASE_TABLET | Freq: Every day | ORAL | Status: DC

## 2015-02-09 MED ORDER — SUCRALFATE 1 GM/10ML PO SUSP: 1.0000 g | Freq: Three times a day (TID) | ORAL | Status: DC

## 2015-02-09 MED ORDER — PANTOPRAZOLE SODIUM 40 MG PO TBEC
40.0000 mg | DELAYED_RELEASE_TABLET | Freq: Two times a day (BID) | ORAL | Status: DC
  Administered 2015-02-09: 40 mg via ORAL
  Filled 2015-02-09: qty 1

## 2015-02-09 NOTE — Progress Notes (Signed)
Key Points: Use following P&T approved IV to PO non-antibiotic change policy.  Description contains the criteria that are approved Note: Policy Excludes:  Esophagectomy patientsPHARMACIST - PHYSICIAN COMMUNICATION DR:   Darrick Meigs CONCERNING: IV to Oral Route Change Policy  RECOMMENDATION: This patient is receiving protonix by the intravenous route.  Based on criteria approved by the Pharmacy and Therapeutics Committee, the intravenous medication(s) is/are being converted to the equivalent oral dose form(s).   DESCRIPTION: These criteria include:  The patient is eating (either orally or via tube) and/or has been taking other orally administered medications for a least 24 hours  The patient has no evidence of active gastrointestinal bleeding or impaired GI absorption (gastrectomy, short bowel, patient on TNA or NPO).  If you have questions about this conversion, please contact the Pharmacy Department  []   [phone] )  [facility] []   [phone] )  [facility]  []   [phone] )  [facility] [x]   [phone] )  [place], [place], [place] 02/09/2015 9:01 AM

## 2015-02-09 NOTE — Progress Notes (Signed)
[facility] notified of need for pt, ot and YBNL/[id number] Davis,RN,BSN,CCM

## 2015-02-09 NOTE — Discharge Summary (Signed)
Physician Discharge Summary  [name]:[id number] DOB: [birth year] DOA: 02/07/2015  PCP: Walker Kehr, MD  Admit date: 02/07/2015 Discharge date: 02/09/2015  Time spent: 25* minutes  Recommendations for Outpatient Follow-up:  1. *Follow up PCP in 2 weeks  Discharge Diagnoses:  Principal Problem:   Dehydration Active Problems:   Chronic lymphocytic leukemia   Essential hypertension   Pain in throat   Hyponatremia   Discharge Condition: Stable  Diet recommendation: Regular diet  Filed Weights   02/08/15 0232  Weight: 64 kg (141 lb 1.5 oz)    History of present illness:  53 are female with a history of CLL, hypertension, hyperlipidemia came to the ED with fatigue with weakness and nausea heartburn throat pain. Patient found to have dehydration with hyponatremia and metabolic acidosis from poor by mouth intake. Started on IV fluids  Hospital Course:   Dehydration  Resolved, from poor by mouth intake. Patient was started on gentle IV hydration and has not improved. Her by mouth intake has also improved this morning she ate her soup without nausea or vomiting.  Nausea with heartburn  likely from GERD. Patient started on Protonix and Carafate and has improved. Abdominal ultrasound was negative for any gallstones. Patient will be discharged home on Carafate for 7 days and Protonix 40 mg by mouth daily.  Hyponatremia Patient came with sodium 125, likely from dehydration and HCTZ. Sodium has now improved to 131 after IV fluids. Will discontinue HCTZ at home.  Hypertension As above will discontinue HCTZ due to the hyponatremia. Patient's blood pressure is well controlled, continue amlodipine 5 mg by mouth daily  History of CLL Follow-up oncology as outpatient. Stable  Due to generalized weakness will get home health PT and home health aide for the patient.  Procedures:  None   Consultations:  None  Discharge Exam: Filed Vitals:   02/09/15 0515  BP: 112/64   Pulse: 60  Temp: 97.6 F (36.4 C)  Resp: 18    General: appears in no acute distress CardiovasculaS1-S2 regular Respiratory: Clear to auscultation bilaterally   Discharge Instructions   Discharge Instructions    Diet - low sodium heart healthy    Complete by:  As directed      Increase activity slowly    Complete by:  As directed           Current Discharge Medication List    START taking these medications   Details  pantoprazole (PROTONIX) 40 MG tablet Take 1 tablet (40 mg total) by mouth daily. Qty: 30 tablet, Refills: 2    sucralfate (CARAFATE) 1 GM/10ML suspension Take 10 mLs (1 g total) by mouth 4 (four) times daily -  with meals and at bedtime. Qty: 420 mL, Refills: 0      CONTINUE these medications which have NOT CHANGED   Details  acetaminophen (TYLENOL) 500 MG tablet Take 500 mg by mouth 2 (two) times daily.     aspirin 81 MG EC tablet Take 81 mg by mouth daily.      B-D 3CC LUER-LOK SYR 25GX1" 25G X 1" 3 ML MISC USE TO ADMINISTER B12 INJECTIONS Qty: 10 each, Refills: 2    B-D INS SYR ULTRAFINE 1CC/30G 30G X 1/2" 1 ML MISC USE TO ADMINISTER B12 INJECTIONS Qty: 10 each, Refills: 1    Cholecalciferol (EQL VITAMIN D3) 1000 UNITS tablet Take 1,000 Units by mouth daily.      cyanocobalamin (,VITAMIN B-12,) 1000 MCG/ML injection ADMINISTER 1 CC SQ EVERY 4 WEEKS Qty:  10 mL, Refills: 0    folic acid (FOLVITE) 1 MG tablet Take 1 mg by mouth daily. For restless legs   Associated Diagnoses: Chronic lymphoid leukemia, without mention of having achieved remission    gabapentin (NEURONTIN) 300 MG capsule TAKE ONE CAPSULE BY MOUTH EVERY DAY Qty: 90 capsule, Refills: 2    Ginseng 250 MG CAPS Take 1 capsule by mouth daily.     Grape Seed Extract 100 MG CAPS Take 1 capsule by mouth daily.     Insulin Syringe-Needle U-100 (B-D INS SYRINGE 0.5CC/30GX1/2") 30G X 1/2" 0.5 ML MISC by Does not apply route. To administer B12 injections     lactobacillus acidophilus &  bulgar (LACTINEX) chewable tablet Chew 1 tablet by mouth daily.    latanoprost (XALATAN) 0.005 % ophthalmic solution Place 1 drop into both eyes at bedtime.    Associated Diagnoses: Chronic lymphoid leukemia, without mention of having achieved remission    !! loratadine (CLARITIN) 10 MG tablet TAKE 1 TABLET BY MOUTH EVERY DAY Qty: 30 tablet, Refills: 5    simvastatin (ZOCOR) 10 MG tablet Take 10 mg by mouth daily.    amLODipine (NORVASC) 5 MG tablet TAKE 1 TABLET (5 MG TOTAL) BY MOUTH DAILY. Qty: 30 tablet, Refills: 11    !! loratadine (CLARITIN) 10 MG tablet TAKE 1 TABLET BY MOUTH EVERY DAY Qty: 30 tablet, Refills: 11    phenazopyridine (PYRIDIUM) 95 MG tablet Take 1 tablet (95 mg total) by mouth 3 (three) times daily as needed for pain. Qty: 10 tablet, Refills: 0     !! - Potential duplicate medications found. Please discuss with provider.    STOP taking these medications     hydrochlorothiazide (MICROZIDE) 12.5 MG capsule      KLOR-CON M10 10 MEQ tablet      omeprazole (PRILOSEC) 40 MG capsule      omeprazole (PRILOSEC) 40 MG capsule        Allergies  Allergen Reactions  . Pneumococcal Vaccines Swelling and Rash    Pneumococcal Vaccine-23 valent  . Clarithromycin Other (See Comments)    REACTION: sore mouth  . Oxycodone-Aspirin Other (See Comments)    REACTION: horrible nightmares  . Propoxyphene N-Acetaminophen Other (See Comments)    REACTION: couldn't wake her up      The results of significant diagnostics from this hospitalization (including imaging, microbiology, ancillary and laboratory) are listed below for reference.    Significant Diagnostic Studies: Dg Chest 2 View  02/07/2015   CLINICAL DATA:  81 year old female with chest pain  EXAM: CHEST  2 VIEW  COMPARISON:  Radiograph dated 10/07/2008  FINDINGS: Two views of the chest demonstrate emphysematous changes of the lungs. There is no focal consolidation, pleural effusion, or pneumothorax. The  cardiomediastinal silhouette is within normal limits. The osseous structures are grossly unremarkable.  IMPRESSION: No active cardiopulmonary disease.   Electronically Signed   By: Anner Crete M.D.   On: 02/07/2015 23:01   US Abdomen Complete  02/08/2015   CLINICAL DATA:  Abdominal pain and nausea for the past 10 days, history of leukemia  EXAM: ULTRASOUND ABDOMEN COMPLETE  COMPARISON:  None.  FINDINGS: The study is technically limited due to excessive bowel gas.  Gallbladder: No gallstones or wall thickening visualized. No sonographic Murphy sign noted.  Common bile duct: Diameter: 3 mm  Liver: No focal lesion identified. Within normal limits in parenchymal echogenicity. There is a hypoechoic/ anechoic focus which lies between the right lobe of the liver in the right  kidney. It measures 2.3 x 1.5 x 1.9 cm.  IVC: The IVC appears mildly distended. There are no abnormal intraluminal echoes.  Pancreas: Visualized portion unremarkable.  Spleen: Size and appearance within normal limits.  Right Kidney: Length: 10.7 cm. Echogenicity within normal limits. No mass or hydronephrosis visualized.  Left Kidney: Length: 11.4 cm. Echogenicity within normal limits. No mass or hydronephrosis visualized.  Abdominal aorta: The aorta it exhibits normal caliber. The bifurcation could not be assessed due to bowel gas.  Other findings: None.  IMPRESSION: 1. The study is limited due to bowel gas. 2. No definite acute hepatobiliary abnormality is demonstrated. There is a cystic appearing structure measuring up to 2.3 cm in diameter were which lies in the soft tissues between the right hepatic lobe in the right kidney. 3. Mild dilation of the IVC which suggests increased right heart pressures. 4. Given the increased abdominal gas and the limitations on this study, abdominal and pelvic CT scanning would be useful.   Electronically Signed   By: David  Martinique M.D.   On: 02/08/2015 14:38    Microbiology: No results found for this or  any previous visit (from the past 240 hour(s)).   Labs: Basic Metabolic Panel:  Recent Labs Lab 02/03/15 1043 02/07/15 2304 02/08/15 0350  NA 137 125* 131*  K 3.9 3.6 3.6  CL  --  92* 98*  CO2 27 21* 27  GLUCOSE 106 122* 118*  BUN 17.7 16 15   CREATININE 0.8 0.87 0.84  CALCIUM 9.9 9.2 9.1   Liver Function Tests:  Recent Labs Lab 02/03/15 1043 02/07/15 2304 02/08/15 0350  AST 20 28 21   ALT 15 15 15   ALKPHOS 60 44 41  BILITOT 0.46 0.8 0.6  PROT 7.6 7.7 7.3  ALBUMIN 3.9 3.8 3.6   No results for input(s): LIPASE, AMYLASE in the last 168 hours. No results for input(s): AMMONIA in the last 168 hours. CBC:  Recent Labs Lab 02/03/15 0909 02/07/15 2304 02/08/15 0350  WBC 5.6 3.8* 4.9  NEUTROABS 3.0 2.1  --   HGB 14.1 13.6 13.1  HCT 41.8 38.2 37.6  MCV 94.0 88.6 89.3  PLT 165 158 155   Cardiac Enzymes:  Recent Labs Lab 02/08/15 0350 02/08/15 1148 02/08/15 1655 02/08/15 2259  TROPONINI <0.03 <0.03 <0.03 <0.03       Signed:  Aries Kasa S  [facility] 02/09/2015, 11:51 AM

## 2015-02-09 NOTE — Progress Notes (Signed)
[facility] PROGRESS NOTE  TARIN JOHNDROW WKG:[id number] DOB: 1925/09/16 DOA: 02/07/2015 PCP: Walker Kehr, MD  HPI/Subjective: 81 year old female with CLL, HTN, HLD presented to the ED for fatigue, weakness, nausea and heart burn. The weakness and fatigue has been ongoing x 1 month. Her PCP placed her on ensure and decreased her statin dose. The nausea and heart burn has persisted for 4-5days when she eats. In the ED, she was found to be dehydrated with hyponatremia and metabolic acidosis due to decreased oral intake and was started on IVF.  She is feeling much better today and is complaining of a 4/10 frontal headache that is improving. She is still feeling weak, but that is improving. She has not had any dizziness with ambulation. She reports her heart burn and throat pain have improved, though she still has mild nausea.   Assessment/Plan: 1. Dehydration - Na+ has improved from 125 upon admission to 131 on 02/08/15  - D/C IVF, continue PO fluids - Continue to monitor Na+ level   2. GERD - Symptoms of nausea, heart burn and throat pain have improved with Carafate Protonix IV - Changed from IV to PO per pharmacy - Abdominal US demonstrated no signs of gallstones and negative Murphy sign.   3. Hypertension - Currently controlled - Continue home meds (amlodipine)  4. Hyponatremia - Improving, Na + has increased from 125 to 131. - Likely due to dehydration, decreased oral intake and HCTZ  Code Status: Full DVT Prophylaxis: Lovenox Family Communication: Patient Disposition Plan: Discharge  Objective: Filed Vitals:   02/09/15 0515  BP: 112/64  Pulse: 60  Temp: 97.6 F (36.4 C)  Resp: 18    Intake/Output Summary (Last 24 hours) at 02/09/15 1016 Last data filed at 02/09/15 0521  Gross per 24 hour  Intake   1410 ml  Output   1100 ml  Net    310 ml   Filed Weights   02/08/15 0232  Weight: 64 kg (141 lb 1.5 oz)    Exam:   General:  Patient is seated in chair in  no distress. She is pleasant, communicates appropriately and is A&O x 3.  Cardiovascular: RRR, no m/r/g, no LE edema  Respiratory: CTA bilaterally, no w/r/r  Abdomen: Soft, nontender  Musculoskeletal: Grossly normal tone BUE/BLE  Data Reviewed: Basic Metabolic Panel:  Recent Labs Lab 02/03/15 1043 02/07/15 2304 02/08/15 0350  NA 137 125* 131*  K 3.9 3.6 3.6  CL  --  92* 98*  CO2 27 21* 27  GLUCOSE 106 122* 118*  BUN 17.7 16 15   CREATININE 0.8 0.87 0.84  CALCIUM 9.9 9.2 9.1   Liver Function Tests:  Recent Labs Lab 02/03/15 1043 02/07/15 2304 02/08/15 0350  AST 20 28 21   ALT 15 15 15   ALKPHOS 60 44 41  BILITOT 0.46 0.8 0.6  PROT 7.6 7.7 7.3  ALBUMIN 3.9 3.8 3.6   No results for input(s): LIPASE, AMYLASE in the last 168 hours. No results for input(s): AMMONIA in the last 168 hours. CBC:  Recent Labs Lab 02/03/15 0909 02/07/15 2304 02/08/15 0350  WBC 5.6 3.8* 4.9  NEUTROABS 3.0 2.1  --   HGB 14.1 13.6 13.1  HCT 41.8 38.2 37.6  MCV 94.0 88.6 89.3  PLT 165 158 155   Cardiac Enzymes:  Recent Labs Lab 02/08/15 0350 02/08/15 1148 02/08/15 1655 02/08/15 2259  TROPONINI <0.03 <0.03 <0.03 <0.03    Studies: Dg Chest 2 View  02/07/2015   CLINICAL DATA:  81 year old female  with chest pain  EXAM: CHEST  2 VIEW  COMPARISON:  Radiograph dated 10/07/2008  FINDINGS: Two views of the chest demonstrate emphysematous changes of the lungs. There is no focal consolidation, pleural effusion, or pneumothorax. The cardiomediastinal silhouette is within normal limits. The osseous structures are grossly unremarkable.  IMPRESSION: No active cardiopulmonary disease.   Electronically Signed   By: Anner Crete M.D.   On: 02/07/2015 23:01   US Abdomen Complete  02/08/2015   CLINICAL DATA:  Abdominal pain and nausea for the past 10 days, history of leukemia  EXAM: ULTRASOUND ABDOMEN COMPLETE  COMPARISON:  None.  FINDINGS: The study is technically limited due to excessive  bowel gas.  Gallbladder: No gallstones or wall thickening visualized. No sonographic Murphy sign noted.  Common bile duct: Diameter: 3 mm  Liver: No focal lesion identified. Within normal limits in parenchymal echogenicity. There is a hypoechoic/ anechoic focus which lies between the right lobe of the liver in the right kidney. It measures 2.3 x 1.5 x 1.9 cm.  IVC: The IVC appears mildly distended. There are no abnormal intraluminal echoes.  Pancreas: Visualized portion unremarkable.  Spleen: Size and appearance within normal limits.  Right Kidney: Length: 10.7 cm. Echogenicity within normal limits. No mass or hydronephrosis visualized.  Left Kidney: Length: 11.4 cm. Echogenicity within normal limits. No mass or hydronephrosis visualized.  Abdominal aorta: The aorta it exhibits normal caliber. The bifurcation could not be assessed due to bowel gas.  Other findings: None.  IMPRESSION: 1. The study is limited due to bowel gas. 2. No definite acute hepatobiliary abnormality is demonstrated. There is a cystic appearing structure measuring up to 2.3 cm in diameter were which lies in the soft tissues between the right hepatic lobe in the right kidney. 3. Mild dilation of the IVC which suggests increased right heart pressures. 4. Given the increased abdominal gas and the limitations on this study, abdominal and pelvic CT scanning would be useful.   Electronically Signed   By: David  Martinique M.D.   On: 02/08/2015 14:38    Scheduled Meds: . amLODipine  5 mg Oral Daily  . aspirin EC  81 mg Oral Daily  . enoxaparin (LOVENOX) injection  40 mg Subcutaneous Q24H  . folic acid  1 mg Oral Daily  . gabapentin  300 mg Oral QHS  . lactobacillus acidophilus & bulgar  1 tablet Oral Daily  . latanoprost  1 drop Both Eyes QHS  . loratadine  10 mg Oral Daily  . pantoprazole  40 mg Oral BID  . simvastatin  10 mg Oral Daily  . sucralfate  1 g Oral TID WC & HS    Principal Problem:   Dehydration Active Problems:   Chronic  lymphocytic leukemia   Essential hypertension   Pain in throat   Hyponatremia   Time spent: 30  Fleet Contras, PA-S Eleonore Chiquito, MD  [facility] Pager [phone]. If 7PM-7AM, please contact night-coverage at [email or web address], password [facility] 02/09/2015, 10:16 AM  LOS: 1 day     I have taken an interval history, reviewed the chart and examined the patient. I agree with the PA student's note,  impression and recommendations. I have made any necessary editorial changes. 81 year old female came with dehydration and hypernatremia started IV fluids and now has improved. She has history of GERD so will be started on Protonix  and Carafate at home.

## 2015-02-09 NOTE — Progress Notes (Signed)
[name] to be D/C'd Home per MD order.  Discussed prescriptions and follow up appointments with the patient. Prescriptions given to patient, medication list explained in detail. Pt verbalized understanding.    Medication List    STOP taking these medications        hydrochlorothiazide 12.5 MG capsule  Commonly known as:  MICROZIDE     KLOR-CON M10 10 MEQ tablet  Generic drug:  potassium chloride     omeprazole 40 MG capsule  Commonly known as:  PRILOSEC      TAKE these medications        acetaminophen 500 MG tablet  Commonly known as:  TYLENOL  Take 500 mg by mouth 2 (two) times daily.     amLODipine 5 MG tablet  Commonly known as:  NORVASC  TAKE 1 TABLET (5 MG TOTAL) BY MOUTH DAILY.     aspirin 81 MG EC tablet  Take 81 mg by mouth daily.     B-D 3CC LUER-LOK SYR 25GX1" 25G X 1" 3 ML Misc  Generic drug:  SYRINGE-NEEDLE (DISP) 3 ML  USE TO ADMINISTER B12 INJECTIONS     B-D INS SYRINGE 0.5CC/30GX1/2" 30G X 1/2" 0.5 ML Misc  Generic drug:  Insulin Syringe-Needle U-100  by Does not apply route. To administer B12 injections     B-D INS SYR ULTRAFINE 1CC/30G 30G X 1/2" 1 ML Misc  Generic drug:  Insulin Syringe-Needle U-100  USE TO ADMINISTER B12 INJECTIONS     cyanocobalamin 1000 MCG/ML injection  Commonly known as:  (VITAMIN B-12)  ADMINISTER 1 CC SQ EVERY 4 WEEKS     EQL VITAMIN D3 1000 UNITS tablet  Generic drug:  Cholecalciferol  Take 1,000 Units by mouth daily.     folic acid 1 MG tablet  Commonly known as:  FOLVITE  Take 1 mg by mouth daily. For restless legs     gabapentin 300 MG capsule  Commonly known as:  NEURONTIN  TAKE ONE CAPSULE BY MOUTH EVERY DAY     Ginseng 250 MG Caps  Take 1 capsule by mouth daily.     Grape Seed Extract 100 MG Caps  Take 1 capsule by mouth daily.     lactobacillus acidophilus & bulgar chewable tablet  Chew 1 tablet by mouth daily.     latanoprost 0.005 % ophthalmic solution  Commonly known as:  XALATAN  Place 1  drop into both eyes at bedtime.     loratadine 10 MG tablet  Commonly known as:  CLARITIN  TAKE 1 TABLET BY MOUTH EVERY DAY     loratadine 10 MG tablet  Commonly known as:  CLARITIN  TAKE 1 TABLET BY MOUTH EVERY DAY     pantoprazole 40 MG tablet  Commonly known as:  PROTONIX  Take 1 tablet (40 mg total) by mouth daily.     phenazopyridine 95 MG tablet  Commonly known as:  PYRIDIUM  Take 1 tablet (95 mg total) by mouth 3 (three) times daily as needed for pain.     simvastatin 10 MG tablet  Commonly known as:  ZOCOR  Take 10 mg by mouth daily.     sucralfate 1 GM/10ML suspension  Commonly known as:  CARAFATE  Take 10 mLs (1 g total) by mouth 4 (four) times daily -  with meals and at bedtime.        Filed Vitals:   02/09/15 1359  BP: 100/51  Pulse: 61  Temp: 98.1 F (36.7 C)  Resp:  16    Skin clean, dry and intact without evidence of skin break down, no evidence of skin tears noted. IV catheter discontinued intact. Site without signs and symptoms of complications. Dressing and pressure applied. Pt denies pain at this time. No complaints noted.  An After Visit Summary was printed and given to the patient. Patient escorted via [place], and D/C home via private auto.  [name] 02/09/2015 5:37 PM

## 2015-02-10 ENCOUNTER — Telehealth: Payer: Self-pay

## 2015-02-10 ENCOUNTER — Ambulatory Visit: Payer: Medicare Other | Admitting: Internal Medicine

## 2015-02-10 NOTE — Telephone Encounter (Signed)
DC'ed to Home on 02/09/15 for hyponatremia and pain in throat.   Transition Care Management Follow-up Telephone Call  Date discharged? 02/09/15  How have you been since you were released from the hospital? Tired - but glad to be home.   Do you understand why you were in the hospital? Yes  Do you understand the discharge instrcutions? Pt did not understand the medication part of her dc instruction  Where were you discharged to? HOME  Items Reviewed:  Medications reviewed: Yes - pt understands her dc med instruction  Allergies reviewed: no new allergies  Dietary changes reviewed: low sodium  Referrals reviewed: No referrals  Functional Questionnaire:   Activities of Daily Living (ADLs):   States they are independent in the following: Walking, Bathing, Feeding and all other ADL's States they require assistance with the following: n/a  Any transportation issues/concerns?: No concerns.   Any patient concerns? All concerns were addressed during call (no concerns at this time).   Confirmed importance and date/time of follow-up visits scheduled: Pt understands the date/time and importance of follow up visit.   Provider Appointment booked with [facility] on Monday July 25 th, 2016 at 4pm  Confirmed with patient if condition begins to worsen call PCP or go to the ER.  Confirmed   Patient was given the office number and encouraged to call back with question or concerns: Pt understands and will contact us with any questions.

## 2015-02-15 ENCOUNTER — Ambulatory Visit: Payer: Medicare Other | Admitting: Internal Medicine

## 2015-02-15 ENCOUNTER — Encounter: Payer: Self-pay | Admitting: Internal Medicine

## 2015-02-15 ENCOUNTER — Other Ambulatory Visit (INDEPENDENT_AMBULATORY_CARE_PROVIDER_SITE_OTHER): Payer: Medicare Other

## 2015-02-15 VITALS — BP 148/80 | HR 68 | Wt 141.0 lb

## 2015-02-15 DIAGNOSIS — R110 Nausea: Secondary | ICD-10-CM | POA: Insufficient documentation

## 2015-02-15 DIAGNOSIS — E871 Hypo-osmolality and hyponatremia: Secondary | ICD-10-CM

## 2015-02-15 LAB — BASIC METABOLIC PANEL
BUN: 16 mg/dL (ref 6–23)
CALCIUM: 9.4 mg/dL (ref 8.4–10.5)
CO2: 31 mEq/L (ref 19–32)
Chloride: 96 mEq/L (ref 96–112)
Creatinine, Ser: 0.89 mg/dL (ref 0.40–1.20)
GFR: 63.83 mL/min (ref >60.00)
GLUCOSE: 115 mg/dL — AB (ref 70–99)
Potassium: 3.8 mEq/L (ref 3.5–5.1)
SODIUM: 132 meq/L — AB (ref 135–145)

## 2015-02-15 LAB — LIPASE: LIPASE: 27 U/L (ref 11.0–59.0)

## 2015-02-15 MED ORDER — ONDANSETRON HCL 4 MG PO TABS: 4.0000 mg | ORAL_TABLET | Freq: Three times a day (TID) | ORAL | Status: DC | PRN

## 2015-02-15 MED ORDER — ONDANSETRON HCL 4 MG/2ML IJ SOLN
4.0000 mg | Freq: Once | INTRAMUSCULAR | Status: AC
  Administered 2015-02-15: 4 mg via INTRAMUSCULAR

## 2015-02-15 MED ORDER — PANTOPRAZOLE SODIUM 40 MG PO TBEC: 40.0000 mg | DELAYED_RELEASE_TABLET | Freq: Two times a day (BID) | ORAL | Status: DC

## 2015-02-15 NOTE — Patient Instructions (Signed)
Stop vitamins and supplements

## 2015-02-15 NOTE — Progress Notes (Signed)
Pre visit review using our clinic review tool, if applicable. No additional management support is needed unless otherwise documented below in the visit note. 

## 2015-02-15 NOTE — Assessment & Plan Note (Signed)
7/16 ?etiology Zofran prn Hold vitamins Small meals

## 2015-02-15 NOTE — Progress Notes (Signed)
Subjective:  Patient ID: [name], female    DOB: 1926/02/23  Age: 81 y.o. MRN: [id number]  CC: No chief complaint on file.   HPI KAILANY DINUNZIO presents for a post-hosp f/u. C/o nausea  Admit date: 02/07/2015 Discharge date: 02/09/2015  Time spent: 25* minutes  Recommendations for Outpatient Follow-up:  1. *Follow up PCP in 2 weeks  Discharge Diagnoses:  Principal Problem:  Dehydration Active Problems:  Chronic lymphocytic leukemia  Essential hypertension  Pain in throat  Hyponatremia   Discharge Condition: Stable  Diet recommendation: Regular diet  Filed Weights   02/08/15 0232  Weight: 64 kg (141 lb 1.5 oz)    History of present illness:  35 are female with a history of CLL, hypertension, hyperlipidemia came to the ED with fatigue with weakness and nausea heartburn throat pain. Patient found to have dehydration with hyponatremia and metabolic acidosis from poor by mouth intake. Started on IV fluids  Hospital Course:   Dehydration  Resolved, from poor by mouth intake. Patient was started on gentle IV hydration and has not improved. Her by mouth intake has also improved this morning she ate her soup without nausea or vomiting.  Nausea with heartburn likely from GERD. Patient started on Protonix and Carafate and has improved. Abdominal ultrasound was negative for any gallstones. Patient will be discharged home on Carafate for 7 days and Protonix 40 mg by mouth daily.  Hyponatremia Patient came with sodium 125, likely from dehydration and HCTZ. Sodium has now improved to 131 after IV fluids. Will discontinue HCTZ at home.  Hypertension As above will discontinue HCTZ due to the hyponatremia. Patient's blood pressure is well controlled, continue amlodipine 5 mg by mouth daily  History of CLL Follow-up oncology as outpatient. Stable  Due to generalized weakness will get home health PT and home health aide for the  patient.  Procedures:  None  Consultations:  None  Discharge Exam: Filed Vitals:   02/09/15 0515  BP: 112/64  Pulse: 60  Temp: 97.6 F (36.4 C)  Resp: 18    General: appears in no acute distress CardiovasculaS1-S2 regular Respiratory: Clear to auscultation bilaterally   Discharge Instructions   Discharge Instructions    Diet - low sodium heart healthy  Complete by: As directed      Increase activity slowly  Complete by: As directed           Current Discharge Medication List    START taking these medications   Details  pantoprazole (PROTONIX) 40 MG tablet Take 1 tablet (40 mg total) by mouth daily. Qty: 30 tablet, Refills: 2    sucralfate (CARAFATE) 1 GM/10ML suspension Take 10 mLs (1 g total) by mouth 4 (four) times daily - with meals and at bedtime. Qty: 420 mL, Refills: 0      CONTINUE these medications which have NOT CHANGED   Details  acetaminophen (TYLENOL) 500 MG tablet Take 500 mg by mouth 2 (two) times daily.     aspirin 81 MG EC tablet Take 81 mg by mouth daily.     B-D 3CC LUER-LOK SYR 25GX1" 25G X 1" 3 ML MISC USE TO ADMINISTER B12 INJECTIONS Qty: 10 each, Refills: 2    B-D INS SYR ULTRAFINE 1CC/30G 30G X 1/2" 1 ML MISC USE TO ADMINISTER B12 INJECTIONS Qty: 10 each, Refills: 1    Cholecalciferol (EQL VITAMIN D3) 1000 UNITS tablet Take 1,000 Units by mouth daily.     cyanocobalamin (,VITAMIN B-12,) 1000 MCG/ML injection ADMINISTER  1 CC SQ EVERY 4 WEEKS Qty: 10 mL, Refills: 0    folic acid (FOLVITE) 1 MG tablet Take 1 mg by mouth daily. For restless legs   Associated Diagnoses: Chronic lymphoid leukemia, without mention of having achieved remission    gabapentin (NEURONTIN) 300 MG capsule TAKE ONE CAPSULE BY MOUTH EVERY DAY Qty: 90 capsule, Refills: 2    Ginseng 250 MG CAPS Take 1 capsule by mouth daily.     Grape Seed Extract 100 MG CAPS Take 1 capsule by  mouth daily.     Insulin Syringe-Needle U-100 (B-D INS SYRINGE 0.5CC/30GX1/2") 30G X 1/2" 0.5 ML MISC by Does not apply route. To administer B12 injections     lactobacillus acidophilus & bulgar (LACTINEX) chewable tablet Chew 1 tablet by mouth daily.    latanoprost (XALATAN) 0.005 % ophthalmic solution Place 1 drop into both eyes at bedtime.    Associated Diagnoses: Chronic lymphoid leukemia, without mention of having achieved remission    !! loratadine (CLARITIN) 10 MG tablet TAKE 1 TABLET BY MOUTH EVERY DAY Qty: 30 tablet, Refills: 5    simvastatin (ZOCOR) 10 MG tablet Take 10 mg by mouth daily.    amLODipine (NORVASC) 5 MG tablet TAKE 1 TABLET (5 MG TOTAL) BY MOUTH DAILY. Qty: 30 tablet, Refills: 11    !! loratadine (CLARITIN) 10 MG tablet TAKE 1 TABLET BY MOUTH EVERY DAY Qty: 30 tablet, Refills: 11    phenazopyridine (PYRIDIUM) 95 MG tablet Take 1 tablet (95 mg total) by mouth 3 (three) times daily as needed for pain. Qty: 10 tablet, Refills: 0    !! - Potential duplicate medications found. Please discuss with provider.    STOP taking these medications     hydrochlorothiazide (MICROZIDE) 12.5 MG capsule      KLOR-CON M10 10 MEQ tablet      omeprazole (PRILOSEC) 40 MG capsule      omeprazole (PRILOSEC) 40 MG capsule        Allergies  Allergen Reactions  . Pneumococcal Vaccines Swelling and Rash    Pneumococcal Vaccine-23 valent  . Clarithromycin Other (See Comments)    REACTION: sore mouth  . Oxycodone-Aspirin Other (See Comments)    REACTION: horrible nightmares  . Propoxyphene N-Acetaminophen Other (See Comments)    REACTION: couldn't wake her up       The results of significant diagnostics from this hospitalization (including imaging, microbiology, ancillary and laboratory) are listed below for reference.    Significant Diagnostic Studies:  Imaging Results    Dg Chest 2  View  02/07/2015 CLINICAL DATA: 81 year old female with chest pain EXAM: CHEST 2 VIEW COMPARISON: Radiograph dated 10/07/2008 FINDINGS: Two views of the chest demonstrate emphysematous changes of the lungs. There is no focal consolidation, pleural effusion, or pneumothorax. The cardiomediastinal silhouette is within normal limits. The osseous structures are grossly unremarkable. IMPRESSION: No active cardiopulmonary disease. Electronically Signed By: Anner Crete M.D. On: 02/07/2015 23:01   US Abdomen Complete  02/08/2015 CLINICAL DATA: Abdominal pain and nausea for the past 10 days, history of leukemia EXAM: ULTRASOUND ABDOMEN COMPLETE COMPARISON: None. FINDINGS: The study is technically limited due to excessive bowel gas. Gallbladder: No gallstones or wall thickening visualized. No sonographic Murphy sign noted. Common bile duct: Diameter: 3 mm Liver: No focal lesion identified. Within normal limits in parenchymal echogenicity. There is a hypoechoic/ anechoic focus which lies between the right lobe of the liver in the right kidney. It measures 2.3 x 1.5 x 1.9 cm. IVC: The IVC appears  mildly distended. There are no abnormal intraluminal echoes. Pancreas: Visualized portion unremarkable. Spleen: Size and appearance within normal limits. Right Kidney: Length: 10.7 cm. Echogenicity within normal limits. No mass or hydronephrosis visualized. Left Kidney: Length: 11.4 cm. Echogenicity within normal limits. No mass or hydronephrosis visualized. Abdominal aorta: The aorta it exhibits normal caliber. The bifurcation could not be assessed due to bowel gas. Other findings: None. IMPRESSION: 1. The study is limited due to bowel gas. 2. No definite acute hepatobiliary abnormality is demonstrated. There is a cystic appearing structure measuring up to 2.3 cm in diameter were which lies in the soft tissues between the right hepatic lobe in the right kidney. 3. Mild dilation of the IVC  which suggests increased right heart pressures. 4. Given the increased abdominal gas and the limitations on this study, abdominal and pelvic CT scanning would be useful. Electronically Signed By: David Martinique M.D. On: 02/08/2015 14:38     Microbiology: No results found for this or any previous visit (from the past 240 hour(s)).   Labs: Basic Metabolic Panel:  Last Labs      Recent Labs Lab 02/03/15 1043 02/07/15 2304 02/08/15 0350  NA 137 125* 131*  K 3.9 3.6 3.6  CL --  92* 98*  CO2 27 21* 27  GLUCOSE 106 122* 118*  BUN 17.7 16 15   CREATININE 0.8 0.87 0.84  CALCIUM 9.9 9.2 9.1     Liver Function Tests:  Last Labs      Recent Labs Lab 02/03/15 1043 02/07/15 2304 02/08/15 0350  AST 20 28 21   ALT 15 15 15   ALKPHOS 60 44 41  BILITOT 0.46 0.8 0.6  PROT 7.6 7.7 7.3  ALBUMIN 3.9 3.8 3.6      Last Labs     No results for input(s): LIPASE, AMYLASE in the last 168 hours.    Last Labs     No results for input(s): AMMONIA in the last 168 hours.   CBC:  Last Labs      Recent Labs Lab 02/03/15 0909 02/07/15 2304 02/08/15 0350  WBC 5.6 3.8* 4.9  NEUTROABS 3.0 2.1 --   HGB 14.1 13.6 13.1  HCT 41.8 38.2 37.6  MCV 94.0 88.6 89.3  PLT 165 158 155     Cardiac Enzymes:  Last Labs      Recent Labs Lab 02/08/15 0350 02/08/15 1148 02/08/15 1655 02/08/15 2259  TROPONINI <0.03 <0.03 <0.03 <0.03         Signed:  [facility] 02/09/2015, 11:51 AM"        Outpatient Prescriptions Prior to Visit  Medication Sig Dispense Refill  . acetaminophen (TYLENOL) 500 MG tablet Take 500 mg by mouth 2 (two) times daily.     Marland Kitchen amLODipine (NORVASC) 5 MG tablet TAKE 1 TABLET (5 MG TOTAL) BY MOUTH DAILY. 30 tablet 11  . aspirin 81 MG EC tablet Take 81 mg by mouth daily.      . B-D 3CC LUER-LOK SYR 25GX1" 25G X 1" 3 ML MISC USE TO  ADMINISTER B12 INJECTIONS 10 each 2  . B-D INS SYR ULTRAFINE 1CC/30G 30G X 1/2" 1 ML MISC USE TO ADMINISTER B12 INJECTIONS 10 each 1  . Cholecalciferol (EQL VITAMIN D3) 1000 UNITS tablet Take 1,000 Units by mouth daily.      . cyanocobalamin (,VITAMIN B-12,) 1000 MCG/ML injection ADMINISTER 1 CC SQ EVERY 4 WEEKS 10 mL 0  . folic acid (FOLVITE) 1 MG tablet Take 1 mg by mouth daily.  For restless legs    . gabapentin (NEURONTIN) 300 MG capsule TAKE ONE CAPSULE BY MOUTH EVERY DAY (Patient taking differently: TAKE ONE CAPSULE BY MOUTH at bedtime) 90 capsule 2  . latanoprost (XALATAN) 0.005 % ophthalmic solution Place 1 drop into both eyes at bedtime.     Marland Kitchen loratadine (CLARITIN) 10 MG tablet TAKE 1 TABLET BY MOUTH EVERY DAY 30 tablet 5  . pantoprazole (PROTONIX) 40 MG tablet Take 1 tablet (40 mg total) by mouth daily. 30 tablet 2  . sucralfate (CARAFATE) 1 GM/10ML suspension Take 10 mLs (1 g total) by mouth 4 (four) times daily -  with meals and at bedtime. 420 mL 0  . Ginseng 250 MG CAPS Take 1 capsule by mouth daily.     . Grape Seed Extract 100 MG CAPS Take 1 capsule by mouth daily.     Marland Kitchen lactobacillus acidophilus & bulgar (LACTINEX) chewable tablet Chew 1 tablet by mouth daily.    Marland Kitchen loratadine (CLARITIN) 10 MG tablet TAKE 1 TABLET BY MOUTH EVERY DAY 30 tablet 11  . phenazopyridine (PYRIDIUM) 95 MG tablet Take 1 tablet (95 mg total) by mouth 3 (three) times daily as needed for pain. 10 tablet 0  . simvastatin (ZOCOR) 10 MG tablet Take 10 mg by mouth daily.    . Insulin Syringe-Needle U-100 (B-D INS SYRINGE 0.5CC/30GX1/2") 30G X 1/2" 0.5 ML MISC by Does not apply route. To administer B12 injections      No facility-administered medications prior to visit.    ROS Review of Systems  Objective:  BP 148/80 mmHg  Pulse 68  Wt 141 lb (63.957 kg)  SpO2 94%  BP Readings from Last 3 Encounters:  02/15/15 148/80  02/09/15 100/51  02/03/15 128/64    Wt Readings from Last 3 Encounters:  02/15/15  141 lb (63.957 kg)  02/08/15 141 lb 1.5 oz (64 kg)  02/03/15 143 lb 1.6 oz (64.91 kg)    Physical Exam  Lab Results  Component Value Date   WBC 4.9 02/08/2015   HGB 13.1 02/08/2015   HCT 37.6 02/08/2015   PLT 155 02/08/2015   GLUCOSE 118* 02/08/2015   CHOL 191 10/01/2014   TRIG 72.0 10/01/2014   HDL 57.80 10/01/2014   LDLCALC 119* 10/01/2014   ALT 15 02/08/2015   AST 21 02/08/2015   NA 131* 02/08/2015   K 3.6 02/08/2015   CL 98* 02/08/2015   CREATININE 0.84 02/08/2015   BUN 15 02/08/2015   CO2 27 02/08/2015   TSH 2.629 02/08/2015   INR 1.0 10/07/2008   HGBA1C  10/07/2008    5.9 (NOTE)   The ADA recommends the following therapeutic goal for glycemic   control related to Hgb A1C measurement:   Goal of Therapy:   < 7.0% Hgb A1C   Reference: American Diabetes Association: Clinical Practice   Recommendations 2008, Diabetes Care,  2008, 31:(Suppl 1).   MICROALBUR 0.4 07/14/2009    Dg Chest 2 View  02/07/2015   CLINICAL DATA:  81 year old female with chest pain  EXAM: CHEST  2 VIEW  COMPARISON:  Radiograph dated 10/07/2008  FINDINGS: Two views of the chest demonstrate emphysematous changes of the lungs. There is no focal consolidation, pleural effusion, or pneumothorax. The cardiomediastinal silhouette is within normal limits. The osseous structures are grossly unremarkable.  IMPRESSION: No active cardiopulmonary disease.   Electronically Signed   By: Anner Crete M.D.   On: 02/07/2015 23:01   US Abdomen Complete  02/08/2015   CLINICAL DATA:  Abdominal pain and nausea for the past 10 days, history of leukemia  EXAM: ULTRASOUND ABDOMEN COMPLETE  COMPARISON:  None.  FINDINGS: The study is technically limited due to excessive bowel gas.  Gallbladder: No gallstones or wall thickening visualized. No sonographic Murphy sign noted.  Common bile duct: Diameter: 3 mm  Liver: No focal lesion identified. Within normal limits in parenchymal echogenicity. There is a hypoechoic/ anechoic focus  which lies between the right lobe of the liver in the right kidney. It measures 2.3 x 1.5 x 1.9 cm.  IVC: The IVC appears mildly distended. There are no abnormal intraluminal echoes.  Pancreas: Visualized portion unremarkable.  Spleen: Size and appearance within normal limits.  Right Kidney: Length: 10.7 cm. Echogenicity within normal limits. No mass or hydronephrosis visualized.  Left Kidney: Length: 11.4 cm. Echogenicity within normal limits. No mass or hydronephrosis visualized.  Abdominal aorta: The aorta it exhibits normal caliber. The bifurcation could not be assessed due to bowel gas.  Other findings: None.  IMPRESSION: 1. The study is limited due to bowel gas. 2. No definite acute hepatobiliary abnormality is demonstrated. There is a cystic appearing structure measuring up to 2.3 cm in diameter were which lies in the soft tissues between the right hepatic lobe in the right kidney. 3. Mild dilation of the IVC which suggests increased right heart pressures. 4. Given the increased abdominal gas and the limitations on this study, abdominal and pelvic CT scanning would be useful.   Electronically Signed   By: David  Martinique M.D.   On: 02/08/2015 14:38    Assessment & Plan:   Diagnoses and all orders for this visit:  Nausea without vomiting  Hyponatremia  Other orders -     ondansetron (ZOFRAN) 4 MG tablet; Take 1 tablet (4 mg total) by mouth every 8 (eight) hours as needed for nausea or vomiting.  I have discontinued Ms. Aydt's Ginseng, Grape Seed Extract, phenazopyridine, simvastatin, lactobacillus acidophilus & bulgar, hydrochlorothiazide, omeprazole, and KLOR-CON M10. I am also having her start on ondansetron. Additionally, I am having her maintain her Insulin Syringe-Needle U-100, aspirin, acetaminophen, Cholecalciferol, latanoprost, B-D INS SYR ULTRAFINE 1CC/30G, cyanocobalamin, folic acid, B-D 3CC LUER-LOK SYR 25GX1", loratadine, gabapentin, amLODipine, pantoprazole, and sucralfate.  Meds  ordered this encounter  Medications  . DISCONTD: hydrochlorothiazide (MICROZIDE) 12.5 MG capsule    Sig: Take 1 capsule by mouth daily.    Refill:  3  . DISCONTD: omeprazole (PRILOSEC) 40 MG capsule    Sig: Take 1 capsule by mouth daily.  Marland Kitchen DISCONTD: KLOR-CON M10 10 MEQ tablet    Sig: Take 10 mEq by mouth daily.    Refill:  5  . ondansetron (ZOFRAN) 4 MG tablet    Sig: Take 1 tablet (4 mg total) by mouth every 8 (eight) hours as needed for nausea or vomiting.    Dispense:  20 tablet    Refill:  1     Follow-up: No Follow-up on file.  Walker Kehr, MD

## 2015-02-15 NOTE — Assessment & Plan Note (Signed)
7/16 D/c HCTZ Labs

## 2015-02-19 ENCOUNTER — Ambulatory Visit: Payer: Self-pay | Admitting: Gynecology

## 2015-02-22 ENCOUNTER — Telehealth: Payer: Self-pay | Admitting: Internal Medicine

## 2015-02-22 NOTE — Telephone Encounter (Signed)
Patient is calling regarding her prescription for pantoprazole (PROTONIX) 40 MG tablet [[id number]. She states Dr. Camila Li told her to start taking 2 tablets 2 times a day instead of 1 tablet 2 times a day. So she is running low and the pharmacist will not refill this because it is not time yet.  Also, patient has only had 3 bowel movements in 2 weeks and is wondering what OTC she can take.  Pharmacy is [facility] on [address]. Please advise Best number to call her is [phone]

## 2015-02-23 NOTE — Telephone Encounter (Signed)
I called pt- she states the pharmacy finally filled her pantoprazole Rx dated 02/15/15. I answered all her questions and advised her to call us back if needed.

## 2015-03-01 DIAGNOSIS — M6281 Muscle weakness (generalized): Secondary | ICD-10-CM | POA: Diagnosis not present

## 2015-03-03 ENCOUNTER — Ambulatory Visit (HOSPITAL_BASED_OUTPATIENT_CLINIC_OR_DEPARTMENT_OTHER): Payer: Medicare Other

## 2015-03-03 VITALS — BP 134/55 | HR 65 | Temp 98.2°F | Resp 18

## 2015-03-03 DIAGNOSIS — C9110 Chronic lymphocytic leukemia of B-cell type not having achieved remission: Secondary | ICD-10-CM

## 2015-03-03 DIAGNOSIS — D801 Nonfamilial hypogammaglobulinemia: Secondary | ICD-10-CM

## 2015-03-03 MED ORDER — SODIUM CHLORIDE 0.9 % IV SOLN
Freq: Once | INTRAVENOUS | Status: AC
  Administered 2015-03-03: 10:00:00 via INTRAVENOUS

## 2015-03-03 MED ORDER — DIPHENHYDRAMINE HCL 25 MG PO CAPS
ORAL_CAPSULE | ORAL | Status: AC
  Filled 2015-03-03: qty 1

## 2015-03-03 MED ORDER — IMMUNE GLOBULIN (HUMAN) 10 GM/100ML IV SOLN
30.0000 g | Freq: Once | INTRAVENOUS | Status: AC
  Administered 2015-03-03: 30 g via INTRAVENOUS
  Filled 2015-03-03: qty 300

## 2015-03-03 MED ORDER — DIPHENHYDRAMINE HCL 25 MG PO CAPS
25.0000 mg | ORAL_CAPSULE | Freq: Once | ORAL | Status: AC
  Administered 2015-03-03: 25 mg via ORAL

## 2015-03-03 NOTE — Patient Instructions (Signed)

## 2015-03-04 ENCOUNTER — Ambulatory Visit (INDEPENDENT_AMBULATORY_CARE_PROVIDER_SITE_OTHER): Payer: Medicare Other | Admitting: Internal Medicine

## 2015-03-04 ENCOUNTER — Encounter: Payer: Self-pay | Admitting: Internal Medicine

## 2015-03-04 VITALS — BP 138/86 | HR 80 | Wt 138.0 lb

## 2015-03-04 DIAGNOSIS — I10 Essential (primary) hypertension: Secondary | ICD-10-CM

## 2015-03-04 DIAGNOSIS — R5382 Chronic fatigue, unspecified: Secondary | ICD-10-CM

## 2015-03-04 DIAGNOSIS — E538 Deficiency of other specified B group vitamins: Secondary | ICD-10-CM

## 2015-03-04 DIAGNOSIS — E871 Hypo-osmolality and hyponatremia: Secondary | ICD-10-CM

## 2015-03-04 DIAGNOSIS — E876 Hypokalemia: Secondary | ICD-10-CM | POA: Diagnosis not present

## 2015-03-04 NOTE — Assessment & Plan Note (Signed)
Off HCTZ 

## 2015-03-04 NOTE — Assessment & Plan Note (Signed)
On B12 

## 2015-03-04 NOTE — Assessment & Plan Note (Signed)
Better  

## 2015-03-04 NOTE — Progress Notes (Signed)
Subjective:  Patient ID: [name], female    DOB: Jan 07, 1926  Age: 81 y.o. MRN: [id number]  CC: No chief complaint on file.   HPI [name] presents for a low sodium f/u. No nausea. Feeling better off Simvastatin, HCTZ.      ROS Review of Systems  Constitutional: Positive for fatigue. Negative for chills, activity change, appetite change and unexpected weight change.  HENT: Negative for congestion, mouth sores and sinus pressure.   Eyes: Negative for visual disturbance.  Respiratory: Negative for cough, chest tightness and shortness of breath.   Gastrointestinal: Negative for nausea and abdominal pain.  Genitourinary: Negative for frequency, difficulty urinating and vaginal pain.  Musculoskeletal: Negative for back pain and gait problem.  Skin: Negative for pallor and rash.  Neurological: Negative for dizziness, tremors, weakness, light-headedness, numbness and headaches.  Psychiatric/Behavioral: Negative for confusion, sleep disturbance and decreased concentration.    Objective:  BP 138/86 mmHg  Pulse 80  Wt 138 lb (62.596 kg)  SpO2 96%  BP Readings from Last 3 Encounters:  03/05/15 110/70  03/04/15 138/86  03/03/15 134/55    Wt Readings from Last 3 Encounters:  03/05/15 133 lb (60.328 kg)  03/04/15 138 lb (62.596 kg)  02/15/15 141 lb (63.957 kg)    Physical Exam  Constitutional: She appears well-developed. No distress.  HENT:  Head: Normocephalic.  Right Ear: External ear normal.  Left Ear: External ear normal.  Nose: Nose normal.  Mouth/Throat: Oropharynx is clear and moist.  Eyes: Conjunctivae are normal. Pupils are equal, round, and reactive to light. Right eye exhibits no discharge. Left eye exhibits no discharge.  Neck: Normal range of motion. Neck supple. No JVD present. No tracheal deviation present. No thyromegaly present.  Cardiovascular: Normal rate, regular rhythm and normal heart sounds.   Pulmonary/Chest: No stridor. No respiratory  distress. She has no wheezes.  Abdominal: Soft. Bowel sounds are normal. She exhibits no distension and no mass. There is no tenderness. There is no rebound and no guarding.  Musculoskeletal: She exhibits no edema or tenderness.  Lymphadenopathy:    She has no cervical adenopathy.  Neurological: She displays normal reflexes. No cranial nerve deficit. She exhibits normal muscle tone. Coordination abnormal.  Skin: No rash noted. No erythema.  Psychiatric: She has a normal mood and affect. Her behavior is normal. Judgment and thought content normal.  Using a cane   Lab Results  Component Value Date   WBC 4.9 02/08/2015   HGB 13.1 02/08/2015   HCT 37.6 02/08/2015   PLT 155 02/08/2015   GLUCOSE 115* 02/15/2015   CHOL 191 10/01/2014   TRIG 72.0 10/01/2014   HDL 57.80 10/01/2014   LDLCALC 119* 10/01/2014   ALT 15 02/08/2015   AST 21 02/08/2015   NA 132* 02/15/2015   K 3.8 02/15/2015   CL 96 02/15/2015   CREATININE 0.89 02/15/2015   BUN 16 02/15/2015   CO2 31 02/15/2015   TSH 2.629 02/08/2015   INR 1.0 10/07/2008   HGBA1C  10/07/2008    5.9 (NOTE)   The ADA recommends the following therapeutic goal for glycemic   control related to Hgb A1C measurement:   Goal of Therapy:   < 7.0% Hgb A1C   Reference: American Diabetes Association: Clinical Practice   Recommendations 2008, Diabetes Care,  2008, 31:(Suppl 1).   MICROALBUR 0.4 07/14/2009    Dg Chest 2 View  02/07/2015   CLINICAL DATA:  81 year old female with chest pain  EXAM: CHEST  2 VIEW  COMPARISON:  Radiograph dated 10/07/2008  FINDINGS: Two views of the chest demonstrate emphysematous changes of the lungs. There is no focal consolidation, pleural effusion, or pneumothorax. The cardiomediastinal silhouette is within normal limits. The osseous structures are grossly unremarkable.  IMPRESSION: No active cardiopulmonary disease.   Electronically Signed   By: Anner Crete M.D.   On: 02/07/2015 23:01   US Abdomen  Complete  02/08/2015   CLINICAL DATA:  Abdominal pain and nausea for the past 10 days, history of leukemia  EXAM: ULTRASOUND ABDOMEN COMPLETE  COMPARISON:  None.  FINDINGS: The study is technically limited due to excessive bowel gas.  Gallbladder: No gallstones or wall thickening visualized. No sonographic Murphy sign noted.  Common bile duct: Diameter: 3 mm  Liver: No focal lesion identified. Within normal limits in parenchymal echogenicity. There is a hypoechoic/ anechoic focus which lies between the right lobe of the liver in the right kidney. It measures 2.3 x 1.5 x 1.9 cm.  IVC: The IVC appears mildly distended. There are no abnormal intraluminal echoes.  Pancreas: Visualized portion unremarkable.  Spleen: Size and appearance within normal limits.  Right Kidney: Length: 10.7 cm. Echogenicity within normal limits. No mass or hydronephrosis visualized.  Left Kidney: Length: 11.4 cm. Echogenicity within normal limits. No mass or hydronephrosis visualized.  Abdominal aorta: The aorta it exhibits normal caliber. The bifurcation could not be assessed due to bowel gas.  Other findings: None.  IMPRESSION: 1. The study is limited due to bowel gas. 2. No definite acute hepatobiliary abnormality is demonstrated. There is a cystic appearing structure measuring up to 2.3 cm in diameter were which lies in the soft tissues between the right hepatic lobe in the right kidney. 3. Mild dilation of the IVC which suggests increased right heart pressures. 4. Given the increased abdominal gas and the limitations on this study, abdominal and pelvic CT scanning would be useful.   Electronically Signed   By: David  Martinique M.D.   On: 02/08/2015 14:38    Assessment & Plan:   Diagnoses and all orders for this visit:  Essential hypertension  B12 deficiency  Hyponatremia  HYPOKALEMIA -     Basic metabolic panel; Future  Chronic fatigue   I am having [name] maintain her Insulin Syringe-Needle U-100, aspirin,  acetaminophen, Cholecalciferol, latanoprost, B-D INS SYR ULTRAFINE 1CC/30G, cyanocobalamin, folic acid, B-D 3CC LUER-LOK SYR 25GX1", loratadine, gabapentin, amLODipine, sucralfate, ondansetron, and pantoprazole.  No orders of the defined types were placed in this encounter.     Follow-up: Return in about 3 months (around 06/04/2015) for a follow-up visit.  Walker Kehr, MD

## 2015-03-04 NOTE — Assessment & Plan Note (Signed)
7/16 due to HCTZ - resolved

## 2015-03-04 NOTE — Progress Notes (Signed)
Pre visit review using our clinic review tool, if applicable. No additional management support is needed unless otherwise documented below in the visit note. 

## 2015-03-05 ENCOUNTER — Encounter: Payer: Self-pay | Admitting: Gynecology

## 2015-03-05 ENCOUNTER — Ambulatory Visit (INDEPENDENT_AMBULATORY_CARE_PROVIDER_SITE_OTHER): Payer: Medicare Other | Admitting: Gynecology

## 2015-03-05 VITALS — BP 110/70 | Ht 62.5 in | Wt 133.0 lb

## 2015-03-05 DIAGNOSIS — IMO0002 Reserved for concepts with insufficient information to code with codable children: Secondary | ICD-10-CM

## 2015-03-05 DIAGNOSIS — N814 Uterovaginal prolapse, unspecified: Secondary | ICD-10-CM | POA: Insufficient documentation

## 2015-03-05 DIAGNOSIS — N8110 Cystocele, unspecified: Secondary | ICD-10-CM

## 2015-03-05 DIAGNOSIS — Z01419 Encounter for gynecological examination (general) (routine) without abnormal findings: Secondary | ICD-10-CM

## 2015-03-05 DIAGNOSIS — Z780 Asymptomatic menopausal state: Secondary | ICD-10-CM | POA: Diagnosis not present

## 2015-03-05 DIAGNOSIS — R350 Frequency of micturition: Secondary | ICD-10-CM

## 2015-03-05 NOTE — Patient Instructions (Signed)

## 2015-03-05 NOTE — Progress Notes (Signed)
[name] 08-Apr-1926 [id number]   History:    81 y.o.  for annual gyn exam who has not had a gynecological exam in many years. She has been followed by her internist Dr. Alain Marion who is been doing her blood work. Please see problem list and current medications for further detail. Patient is otherwise asymptomatic today. She does complain at times her urinary frequency but no incontinence. She was treated for urinary tract infection last month. Patient many years ago had a total abdominal hysterectomy for benign indications. Patient does not recall ever having been on any hormone replacement therapy in the past. Patient reports normal colonoscopy in 2008. No past history of bone density study.  Past medical history,surgical history, family history and social history were all reviewed and documented in the EPIC chart.  Gynecologic History No LMP recorded. Patient is postmenopausal. Contraception: status post hysterectomy Last Pap: 5 years ago. Results were: normal Last mammogram: Over 5 years ago. Results were: normal  Obstetric History OB History  Gravida Para Term Preterm AB SAB TAB Ectopic Multiple Living  4 4        4     # Outcome Date GA Lbr Len/2nd Weight Sex Delivery Anes PTL Lv  4 Para           3 Para           2 Para           1 Para                ROS: A ROS was performed and pertinent positives and negatives are included in the history.  GENERAL: No fevers or chills. HEENT: No change in vision, no earache, sore throat or sinus congestion. NECK: No pain or stiffness. CARDIOVASCULAR: No chest pain or pressure. No palpitations. PULMONARY: No shortness of breath, cough or wheeze. GASTROINTESTINAL: No abdominal pain, nausea, vomiting or diarrhea, melena or bright red blood per rectum. GENITOURINARY: No urinary frequency, urgency, hesitancy or dysuria. MUSCULOSKELETAL: No joint or muscle pain, no back pain, no recent trauma. DERMATOLOGIC: No rash, no itching, no lesions.  ENDOCRINE: No polyuria, polydipsia, no heat or cold intolerance. No recent change in weight. HEMATOLOGICAL: No anemia or easy bruising or bleeding. NEUROLOGIC: No headache, seizures, numbness, tingling or weakness. PSYCHIATRIC: No depression, no loss of interest in normal activity or change in sleep pattern.     Exam: chaperone present  BP 110/70 mmHg  Ht 5' 2.5" (1.588 m)  Wt 133 lb (60.328 kg)  BMI 23.92 kg/m2  Body mass index is 23.92 kg/(m^2).  General appearance : Well developed well nourished female. No acute distress HEENT: Eyes: no retinal hemorrhage or exudates,  Neck supple, trachea midline, no carotid bruits, no thyroidmegaly Lungs: Clear to auscultation, no rhonchi or wheezes, or rib retractions  Heart: Regular rate and rhythm, no murmurs or gallops Breast:Examined in sitting and supine position were symmetrical in appearance, no palpable masses or tenderness,  no skin retraction, no nipple inversion, no nipple discharge, no skin discoloration, no axillary or supraclavicular lymphadenopathy Abdomen: no palpable masses or tenderness, no rebound or guarding Extremities: no edema or skin discoloration or tenderness  Pelvic:  Bartholin, Urethra, Skene Glands: Within normal limits             Vagina: No gross lesions or discharge, atrophic changes, mild 1-2 cystocele  Cervix: Absent  Uterus  absent  Adnexa  Without masses or tenderness  Anus and perineum  normal   Rectovaginal  normal sphincter tone without palpated masses or tenderness             Hemoccult PCP provides     Assessment/Plan:  81 y.o. female for annual exam who is long overdue for mammogram for which a requisition was provided for her to schedule. Patient to schedule her bone density study here in our office in the next 2 weeks. PCP has been doing her blood work. Pap smear not indicated according to the new guidelines. Urinalysis pending. Patient's vaccines are up-to-date according to her PCP. Patient with  1-2 cystocele.   Terrance Mass MD, 10:06 AM 03/05/2015

## 2015-03-06 ENCOUNTER — Other Ambulatory Visit: Payer: Self-pay | Admitting: Internal Medicine

## 2015-03-06 LAB — URINALYSIS W MICROSCOPIC + REFLEX CULTURE
Bilirubin Urine: NEGATIVE
CASTS: NONE SEEN [LPF]
CRYSTALS: NONE SEEN [HPF]
Glucose, UA: NEGATIVE
KETONES UR: NEGATIVE
NITRITE: POSITIVE — AB
PH: 6 (ref 5.0–8.0)
Protein, ur: NEGATIVE
SPECIFIC GRAVITY, URINE: 1.016 (ref 1.001–1.035)
Yeast: NONE SEEN [HPF]

## 2015-03-07 ENCOUNTER — Other Ambulatory Visit: Payer: Self-pay | Admitting: Internal Medicine

## 2015-03-08 ENCOUNTER — Encounter: Payer: Self-pay | Admitting: Internal Medicine

## 2015-03-08 ENCOUNTER — Other Ambulatory Visit: Payer: Self-pay | Admitting: Gynecology

## 2015-03-08 MED ORDER — SULFAMETHOXAZOLE-TRIMETHOPRIM 800-160 MG PO TABS: 1.0000 | ORAL_TABLET | Freq: Two times a day (BID) | ORAL | Status: DC

## 2015-03-09 LAB — URINE CULTURE: Colony Count: >=100000

## 2015-03-31 ENCOUNTER — Other Ambulatory Visit (INDEPENDENT_AMBULATORY_CARE_PROVIDER_SITE_OTHER): Payer: Medicare Other

## 2015-03-31 ENCOUNTER — Ambulatory Visit (HOSPITAL_BASED_OUTPATIENT_CLINIC_OR_DEPARTMENT_OTHER): Payer: Medicare Other

## 2015-03-31 VITALS — BP 124/74 | HR 64 | Temp 97.0°F | Resp 20

## 2015-03-31 DIAGNOSIS — E876 Hypokalemia: Secondary | ICD-10-CM

## 2015-03-31 DIAGNOSIS — D801 Nonfamilial hypogammaglobulinemia: Secondary | ICD-10-CM | POA: Diagnosis not present

## 2015-03-31 DIAGNOSIS — C9110 Chronic lymphocytic leukemia of B-cell type not having achieved remission: Secondary | ICD-10-CM

## 2015-03-31 LAB — BASIC METABOLIC PANEL
BUN: 10 mg/dL (ref 6–23)
CALCIUM: 9.3 mg/dL (ref 8.4–10.5)
CO2: 28 mEq/L (ref 19–32)
Chloride: 100 mEq/L (ref 96–112)
Creatinine, Ser: 0.87 mg/dL (ref 0.40–1.20)
GFR: 65.51 mL/min (ref >60.00)
GLUCOSE: 114 mg/dL — AB (ref 70–99)
POTASSIUM: 3.9 meq/L (ref 3.5–5.1)
SODIUM: 134 meq/L — AB (ref 135–145)

## 2015-03-31 MED ORDER — IMMUNE GLOBULIN (HUMAN) 10 GM/100ML IV SOLN
30.0000 g | Freq: Once | INTRAVENOUS | Status: AC
  Administered 2015-03-31: 30 g via INTRAVENOUS
  Filled 2015-03-31: qty 300

## 2015-03-31 MED ORDER — DIPHENHYDRAMINE HCL 25 MG PO CAPS
25.0000 mg | ORAL_CAPSULE | Freq: Once | ORAL | Status: AC
  Administered 2015-03-31: 25 mg via ORAL

## 2015-03-31 MED ORDER — DIPHENHYDRAMINE HCL 25 MG PO CAPS
ORAL_CAPSULE | ORAL | Status: AC
  Filled 2015-03-31: qty 1

## 2015-03-31 NOTE — Patient Instructions (Signed)

## 2015-04-05 ENCOUNTER — Telehealth: Payer: Self-pay | Admitting: Internal Medicine

## 2015-04-05 MED ORDER — CIPRO 250 MG PO TABS: 250.0000 mg | ORAL_TABLET | Freq: Every day | ORAL | Status: DC

## 2015-04-05 NOTE — Telephone Encounter (Signed)
OK KCl and a MVI OK Cipro 250 mg po qd #5, no ref Thx

## 2015-04-05 NOTE — Telephone Encounter (Signed)
Pt informed of below. See meds.  

## 2015-04-05 NOTE — Telephone Encounter (Signed)
Pt called request test result that was done 03/31/15, pt also want to know if she can take vitamin potassium? And pt stated that she might have a bladder infection and was wondering if Dr. Camila Li can give her something with out coming for the appt. Please call pt.

## 2015-04-06 ENCOUNTER — Telehealth: Payer: Self-pay | Admitting: Internal Medicine

## 2015-04-06 NOTE — Telephone Encounter (Signed)
Patient would like the results of her bloodwork.

## 2015-04-06 NOTE — Telephone Encounter (Signed)
Called patient back and advised that we are waiting for dr plotnikov to make comments on final results of lab work, we will call patient back with dr plotnikovs advisement

## 2015-04-07 NOTE — Telephone Encounter (Signed)
Labs are ok Thx

## 2015-04-08 ENCOUNTER — Telehealth: Payer: Self-pay | Admitting: Internal Medicine

## 2015-04-08 NOTE — Telephone Encounter (Signed)
Pt called in and she is confussed on which meds she is suppose to be taking.  Can you call her when you get a chance?    Best number [phone]

## 2015-04-08 NOTE — Telephone Encounter (Signed)
Notified pt with md response.../lmb 

## 2015-04-08 NOTE — Telephone Encounter (Signed)
Called pt she stated needing to make sure if she need to be taking HCTZ & Simvastatin. Advise pt per chart the HCTZ was D/c back in July when she was in the hospital & per md notes he wanted her you to stay off med. Far as simvastatin he has reduce to 10 mg  Instead of the 20 mg. Pt states she has a lot of the 20 mg tab she will cut those in half and start back taking. All questions was answered and pt understand what she need to be taking...Johny Chess

## 2015-04-29 ENCOUNTER — Ambulatory Visit (HOSPITAL_BASED_OUTPATIENT_CLINIC_OR_DEPARTMENT_OTHER): Payer: Medicare Other

## 2015-04-29 ENCOUNTER — Telehealth: Payer: Self-pay | Admitting: Oncology

## 2015-04-29 ENCOUNTER — Other Ambulatory Visit: Payer: Self-pay | Admitting: *Deleted

## 2015-04-29 ENCOUNTER — Ambulatory Visit (HOSPITAL_BASED_OUTPATIENT_CLINIC_OR_DEPARTMENT_OTHER): Payer: Medicare Other | Admitting: Oncology

## 2015-04-29 ENCOUNTER — Other Ambulatory Visit (HOSPITAL_BASED_OUTPATIENT_CLINIC_OR_DEPARTMENT_OTHER): Payer: Medicare Other

## 2015-04-29 VITALS — BP 121/66 | HR 68 | Temp 98.2°F | Resp 17

## 2015-04-29 VITALS — BP 132/75 | HR 75 | Temp 98.8°F | Resp 16 | Ht 62.5 in | Wt 140.9 lb

## 2015-04-29 DIAGNOSIS — C9110 Chronic lymphocytic leukemia of B-cell type not having achieved remission: Secondary | ICD-10-CM

## 2015-04-29 DIAGNOSIS — N3090 Cystitis, unspecified without hematuria: Secondary | ICD-10-CM

## 2015-04-29 DIAGNOSIS — D801 Nonfamilial hypogammaglobulinemia: Secondary | ICD-10-CM

## 2015-04-29 DIAGNOSIS — R232 Flushing: Secondary | ICD-10-CM

## 2015-04-29 DIAGNOSIS — Z23 Encounter for immunization: Secondary | ICD-10-CM | POA: Diagnosis not present

## 2015-04-29 LAB — URINALYSIS, MICROSCOPIC - CHCC
BILIRUBIN (URINE): NEGATIVE
Glucose: NEGATIVE mg/dL
KETONES: NEGATIVE mg/dL
Leukocyte Esterase: NEGATIVE
Nitrite: NEGATIVE
Protein: NEGATIVE mg/dL
RBC / HPF: NEGATIVE (ref 0–2)
Specific Gravity, Urine: 1.005 (ref 1.003–1.035)
Urobilinogen, UR: 0.2 mg/dL (ref 0.2–1)
pH: 6.5 (ref 4.6–8.0)

## 2015-04-29 LAB — CBC WITH DIFFERENTIAL/PLATELET
BASO%: 0.7 % (ref 0.0–2.0)
Basophils Absolute: 0 10*3/uL (ref 0.0–0.1)
EOS ABS: 0.1 10*3/uL (ref 0.0–0.5)
EOS%: 1.4 % (ref 0.0–7.0)
HCT: 42.9 % (ref 34.8–46.6)
HEMOGLOBIN: 14.3 g/dL (ref 11.6–15.9)
LYMPH%: 31.7 % (ref 14.0–49.7)
MCH: 31.3 pg (ref 25.1–34.0)
MCHC: 33.4 g/dL (ref 31.5–36.0)
MCV: 93.7 fL (ref 79.5–101.0)
MONO#: 0.4 10*3/uL (ref 0.1–0.9)
MONO%: 7.1 % (ref 0.0–14.0)
NEUT%: 59.1 % (ref 38.4–76.8)
NEUTROS ABS: 3.3 10*3/uL (ref 1.5–6.5)
PLATELETS: 152 10*3/uL (ref 145–400)
RBC: 4.57 10*6/uL (ref 3.70–5.45)
RDW: 14.1 % (ref 11.2–14.5)
WBC: 5.6 10*3/uL (ref 3.9–10.3)
lymph#: 1.8 10*3/uL (ref 0.9–3.3)

## 2015-04-29 MED ORDER — IMMUNE GLOBULIN (HUMAN) 10 GM/100ML IV SOLN
30.0000 g | Freq: Once | INTRAVENOUS | Status: AC
  Administered 2015-04-29: 30 g via INTRAVENOUS
  Filled 2015-04-29: qty 300

## 2015-04-29 MED ORDER — INFLUENZA VAC SPLIT QUAD 0.5 ML IM SUSY
0.5000 mL | PREFILLED_SYRINGE | Freq: Once | INTRAMUSCULAR | Status: AC
  Administered 2015-04-29: 0.5 mL via INTRAMUSCULAR
  Filled 2015-04-29: qty 0.5

## 2015-04-29 MED ORDER — INFLUENZA VAC SPLIT QUAD 0.5 ML IM SUSY
0.5000 mL | PREFILLED_SYRINGE | Freq: Once | INTRAMUSCULAR | Status: DC
  Filled 2015-04-29: qty 0.5

## 2015-04-29 MED ORDER — SODIUM CHLORIDE 0.9 % IV SOLN
Freq: Once | INTRAVENOUS | Status: AC
  Administered 2015-04-29: 11:00:00 via INTRAVENOUS

## 2015-04-29 MED ORDER — DIPHENHYDRAMINE HCL 25 MG PO CAPS
ORAL_CAPSULE | ORAL | Status: AC
  Filled 2015-04-29: qty 1

## 2015-04-29 MED ORDER — DIPHENHYDRAMINE HCL 25 MG PO CAPS
25.0000 mg | ORAL_CAPSULE | Freq: Once | ORAL | Status: AC
  Administered 2015-04-29: 25 mg via ORAL

## 2015-04-29 NOTE — Telephone Encounter (Signed)
ADDED APPT PER POF PT WILL GET SCHED IN CHEMO

## 2015-04-29 NOTE — Progress Notes (Signed)
  [facility] OFFICE PROGRESS NOTE   Diagnosis: CLL  INTERVAL HISTORY:   She returns as scheduled. She continues monthly IVIG. Ms. Alers was admitted in July with "dehydration". She was treated for a urinary tract infection in August. She reports just your area today. No other infection. She reports generalized leg weakness, she is able to walk for 20 minutes.  Objective:  Vital signs in last 24 hours:  Blood pressure 132/75, pulse 75, temperature 98.8 F (37.1 C), temperature source Oral, resp. rate 16, height 5' 2.5" (1.588 m), weight 140 lb 14.4 oz (63.912 kg), SpO2 98 %.    HEENT: Neck without mass Lymphatics: No cervical, supraclavicular, axillary, or inguinal nodes Resp: Lungs clear bilaterally Cardio: Regular rate and rhythm GI: No hepatomegaly, nontender Vascular: No leg edema Neurologic: The leg and foot strength are intact bilaterally  Lab Results:  Lab Results  Component Value Date   WBC 5.6 04/29/2015   HGB 14.3 04/29/2015   HCT 42.9 04/29/2015   MCV 93.7 04/29/2015   PLT 152 04/29/2015   NEUTROABS 3.3 04/29/2015     Medications: I have reviewed the patient's current medications.  Assessment/Plan: 1. Chronic lymphocytic leukemia, asymptomatic. 2. History of hypogammaglobulinemia with recurrent infections. She continues monthly IVIG replacement therapy. 3. History of recurrent urinary tract infections. Coagulase-negative Staphylococcus infection August 2016 4. History of mild thrombocytopenia secondary to CLL.     Disposition:  Ms. Kramlich is stable from a hematologic standpoint. She will continue monthly IVIG. We will check a urinalysis and urine culture today. She received an influenza vaccine today. Ms. Rizo will be scheduled for an office visit in 3 months.  Betsy Coder, MD  04/29/2015  10:39 AM

## 2015-04-29 NOTE — Patient Instructions (Signed)

## 2015-04-29 NOTE — Telephone Encounter (Signed)
Gave and printed pt new sched she wanted to be on wednesdays

## 2015-04-30 ENCOUNTER — Telehealth: Payer: Self-pay | Admitting: *Deleted

## 2015-04-30 NOTE — Telephone Encounter (Signed)
Per Dr. Benay Spice; notified pt that urinalysis is negative.  Pt verbalized understanding and expressed appreciation for call.

## 2015-04-30 NOTE — Telephone Encounter (Signed)
-----   Message from Ladell Pier, MD sent at 04/29/2015  7:53 PM EDT ----- Please call patient, urinalysis is negative

## 2015-05-03 LAB — URINE CULTURE

## 2015-05-03 LAB — IGG, IGA, IGM
IgA: 291 mg/dL (ref 69–380)
IgG (Immunoglobin G), Serum: 1400 mg/dL (ref 690–1700)
IgM, Serum: 59 mg/dL (ref 52–322)

## 2015-05-05 ENCOUNTER — Encounter: Payer: Self-pay | Admitting: Internal Medicine

## 2015-05-05 ENCOUNTER — Ambulatory Visit (INDEPENDENT_AMBULATORY_CARE_PROVIDER_SITE_OTHER): Payer: Medicare Other | Admitting: Internal Medicine

## 2015-05-05 ENCOUNTER — Other Ambulatory Visit (INDEPENDENT_AMBULATORY_CARE_PROVIDER_SITE_OTHER): Payer: Medicare Other

## 2015-05-05 VITALS — BP 136/88 | HR 90 | Wt 140.0 lb

## 2015-05-05 DIAGNOSIS — L570 Actinic keratosis: Secondary | ICD-10-CM | POA: Diagnosis not present

## 2015-05-05 DIAGNOSIS — E538 Deficiency of other specified B group vitamins: Secondary | ICD-10-CM | POA: Diagnosis not present

## 2015-05-05 DIAGNOSIS — I10 Essential (primary) hypertension: Secondary | ICD-10-CM | POA: Diagnosis not present

## 2015-05-05 DIAGNOSIS — E871 Hypo-osmolality and hyponatremia: Secondary | ICD-10-CM | POA: Diagnosis not present

## 2015-05-05 LAB — BASIC METABOLIC PANEL
BUN: 17 mg/dL (ref 6–23)
CALCIUM: 9.8 mg/dL (ref 8.4–10.5)
CO2: 28 mEq/L (ref 19–32)
CREATININE: 0.92 mg/dL (ref 0.40–1.20)
Chloride: 100 mEq/L (ref 96–112)
GFR: 61.4 mL/min (ref >60.00)
Glucose, Bld: 104 mg/dL — ABNORMAL HIGH (ref 70–99)
Potassium: 4.2 mEq/L (ref 3.5–5.1)
Sodium: 136 mEq/L (ref 135–145)

## 2015-05-05 NOTE — Assessment & Plan Note (Signed)
Off HCTZ BMET

## 2015-05-05 NOTE — Assessment & Plan Note (Signed)
Off HCTZ On amlodipine

## 2015-05-05 NOTE — Progress Notes (Signed)
Subjective:  Patient ID: [name], female    DOB: [birth year]  Age: 81 y.o. MRN: [id number]  CC: No chief complaint on file.   HPI [name] presents for low Na, fatigue, B12 def f/u. Feeling better off HCTZ  Outpatient Prescriptions Prior to Visit  Medication Sig Dispense Refill  . acetaminophen (TYLENOL) 500 MG tablet Take 500 mg by mouth 2 (two) times daily.     Marland Kitchen amLODipine (NORVASC) 5 MG tablet TAKE 1 TABLET (5 MG TOTAL) BY MOUTH DAILY. 30 tablet 11  . aspirin 81 MG EC tablet Take 81 mg by mouth daily.      . B-D 3CC LUER-LOK SYR 25GX1" 25G X 1" 3 ML MISC USE TO ADMINISTER B12 INJECTIONS 10 each 2  . B-D INS SYR ULTRAFINE 1CC/30G 30G X 1/2" 1 ML MISC USE TO ADMINISTER B12 INJECTIONS 10 each 1  . Cholecalciferol (EQL VITAMIN D3) 1000 UNITS tablet Take 1,000 Units by mouth daily.      . cyanocobalamin (,VITAMIN B-12,) 1000 MCG/ML injection ADMINISTER 1 CC SQ EVERY 4 WEEKS 10 mL 0  . folic acid (FOLVITE) 1 MG tablet Take 1 mg by mouth daily. For restless legs    . gabapentin (NEURONTIN) 300 MG capsule TAKE ONE CAPSULE BY MOUTH EVERY DAY (Patient taking differently: TAKE ONE CAPSULE BY MOUTH at bedtime) 90 capsule 2  . Insulin Syringe-Needle U-100 (B-D INS SYRINGE 0.5CC/30GX1/2") 30G X 1/2" 0.5 ML MISC by Does not apply route. To administer B12 injections     . latanoprost (XALATAN) 0.005 % ophthalmic solution Place 1 drop into both eyes at bedtime.     Marland Kitchen loratadine (CLARITIN) 10 MG tablet TAKE 1 TABLET BY MOUTH EVERY DAY 30 tablet 5  . omeprazole (PRILOSEC) 40 MG capsule TAKE 1 CAPSULE (40 MG TOTAL) BY MOUTH DAILY. 90 capsule 2  . pantoprazole (PROTONIX) 40 MG tablet Take 1 tablet (40 mg total) by mouth 2 (two) times daily. 60 tablet 5  . potassium chloride (KLOR-CON M10) 10 MEQ tablet Take 1 tablet (10 mEq total) by mouth daily. 90 tablet 3  . simvastatin (ZOCOR) 20 MG tablet Take 10 mg by mouth daily.    . ondansetron (ZOFRAN) 4 MG tablet Take 1 tablet (4 mg total) by mouth  every 8 (eight) hours as needed for nausea or vomiting. (Patient not taking: Reported on 05/05/2015) 20 tablet 1  . sucralfate (CARAFATE) 1 GM/10ML suspension Take 10 mLs (1 g total) by mouth 4 (four) times daily -  with meals and at bedtime. 420 mL 0   No facility-administered medications prior to visit.    ROS Review of Systems  Constitutional: Positive for fatigue. Negative for chills, activity change, appetite change and unexpected weight change.  HENT: Negative for congestion, mouth sores and sinus pressure.   Eyes: Negative for visual disturbance.  Respiratory: Negative for cough and chest tightness.   Gastrointestinal: Negative for nausea and abdominal pain.  Genitourinary: Negative for frequency, difficulty urinating and vaginal pain.  Musculoskeletal: Negative for back pain and gait problem.  Skin: Negative for pallor and rash.  Neurological: Negative for dizziness, tremors, weakness, numbness and headaches.  Psychiatric/Behavioral: Negative for confusion and sleep disturbance. The patient is not nervous/anxious.     Objective:  BP 136/88 mmHg  Pulse 90  Wt 140 lb (63.504 kg)  SpO2 96%  BP Readings from Last 3 Encounters:  05/05/15 136/88  04/29/15 121/66  04/29/15 132/75    Wt Readings from Last 3 Encounters:  05/05/15 140 lb (63.504 kg)  04/29/15 140 lb 14.4 oz (63.912 kg)  03/05/15 133 lb (60.328 kg)    Physical Exam  Constitutional: She appears well-developed. No distress.  HENT:  Head: Normocephalic.  Right Ear: External ear normal.  Left Ear: External ear normal.  Nose: Nose normal.  Mouth/Throat: Oropharynx is clear and moist.  Eyes: Conjunctivae are normal. Pupils are equal, round, and reactive to light. Right eye exhibits no discharge. Left eye exhibits no discharge.  Neck: Normal range of motion. Neck supple. No JVD present. No tracheal deviation present. No thyromegaly present.  Cardiovascular: Normal rate, regular rhythm and normal heart sounds.     Pulmonary/Chest: No stridor. No respiratory distress. She has no wheezes.  Abdominal: Soft. Bowel sounds are normal. She exhibits no distension and no mass. There is no tenderness. There is no rebound and no guarding.  Musculoskeletal: She exhibits no edema or tenderness.  Lymphadenopathy:    She has no cervical adenopathy.  Neurological: She displays normal reflexes. No cranial nerve deficit. She exhibits normal muscle tone. Coordination normal.  Skin: No rash noted. No erythema.  Psychiatric: She has a normal mood and affect. Her behavior is normal. Judgment and thought content normal.  AK on L nose  Lab Results  Component Value Date   WBC 5.6 04/29/2015   HGB 14.3 04/29/2015   HCT 42.9 04/29/2015   PLT 152 04/29/2015   GLUCOSE 114* 03/31/2015   CHOL 191 10/01/2014   TRIG 72.0 10/01/2014   HDL 57.80 10/01/2014   LDLCALC 119* 10/01/2014   ALT 15 02/08/2015   AST 21 02/08/2015   NA 134* 03/31/2015   K 3.9 03/31/2015   CL 100 03/31/2015   CREATININE 0.87 03/31/2015   BUN 10 03/31/2015   CO2 28 03/31/2015   TSH 2.629 02/08/2015   INR 1.0 10/07/2008   HGBA1C  10/07/2008    5.9 (NOTE)   The ADA recommends the following therapeutic goal for glycemic   control related to Hgb A1C measurement:   Goal of Therapy:   < 7.0% Hgb A1C   Reference: American Diabetes Association: Clinical Practice   Recommendations 2008, Diabetes Care,  2008, 31:(Suppl 1).   MICROALBUR 0.4 07/14/2009    Dg Chest 2 View  02/07/2015  CLINICAL DATA:  81 year old female with chest pain EXAM: CHEST  2 VIEW COMPARISON:  Radiograph dated 10/07/2008 FINDINGS: Two views of the chest demonstrate emphysematous changes of the lungs. There is no focal consolidation, pleural effusion, or pneumothorax. The cardiomediastinal silhouette is within normal limits. The osseous structures are grossly unremarkable. IMPRESSION: No active cardiopulmonary disease. Electronically Signed   By: Anner Crete M.D.   On: 02/07/2015  23:01   US Abdomen Complete  02/08/2015  CLINICAL DATA:  Abdominal pain and nausea for the past 10 days, history of leukemia EXAM: ULTRASOUND ABDOMEN COMPLETE COMPARISON:  None. FINDINGS: The study is technically limited due to excessive bowel gas. Gallbladder: No gallstones or wall thickening visualized. No sonographic Murphy sign noted. Common bile duct: Diameter: 3 mm Liver: No focal lesion identified. Within normal limits in parenchymal echogenicity. There is a hypoechoic/ anechoic focus which lies between the right lobe of the liver in the right kidney. It measures 2.3 x 1.5 x 1.9 cm. IVC: The IVC appears mildly distended. There are no abnormal intraluminal echoes. Pancreas: Visualized portion unremarkable. Spleen: Size and appearance within normal limits. Right Kidney: Length: 10.7 cm. Echogenicity within normal limits. No mass or hydronephrosis visualized. Left Kidney: Length: 11.4 cm.  Echogenicity within normal limits. No mass or hydronephrosis visualized. Abdominal aorta: The aorta it exhibits normal caliber. The bifurcation could not be assessed due to bowel gas. Other findings: None. IMPRESSION: 1. The study is limited due to bowel gas. 2. No definite acute hepatobiliary abnormality is demonstrated. There is a cystic appearing structure measuring up to 2.3 cm in diameter were which lies in the soft tissues between the right hepatic lobe in the right kidney. 3. Mild dilation of the IVC which suggests increased right heart pressures. 4. Given the increased abdominal gas and the limitations on this study, abdominal and pelvic CT scanning would be useful. Electronically Signed   By: David  Martinique M.D.   On: 02/08/2015 14:38    Procedure Note :     Procedure : Cryosurgery   Indication: Actinic keratosis(es)   Risks including unsuccessful procedure , bleeding, infection, bruising, scar, a need for a repeat  procedure and others were explained to the patient in detail as well as the benefits. Informed  consent was obtained verbally.    1 lesion(s)  on  L nose  was/were treated with liquid nitrogen on a Q-tip in a usual fasion . Band-Aid was applied and antibiotic ointment was given for a later use.   Tolerated well. Complications none.   Postprocedure instructions :     Keep the wounds clean. You can wash them with liquid soap and water. Pat dry with gauze or a Kleenex tissue  Before applying antibiotic ointment and a Band-Aid.   You need to report immediately  if  any signs of infection develop.    Assessment & Plan:   There are no diagnoses linked to this encounter. I am having Ms. Hardaway maintain her Insulin Syringe-Needle U-100, aspirin, acetaminophen, Cholecalciferol, latanoprost, B-D INS SYR ULTRAFINE 1CC/30G, cyanocobalamin, folic acid, B-D 3CC LUER-LOK SYR 25GX1", gabapentin, amLODipine, sucralfate, ondansetron, pantoprazole, omeprazole, loratadine, potassium chloride, and simvastatin.  No orders of the defined types were placed in this encounter.     Follow-up: No Follow-up on file.  Walker Kehr, MD

## 2015-05-05 NOTE — Assessment & Plan Note (Signed)
On B12 

## 2015-05-05 NOTE — Progress Notes (Signed)
Pre visit review using our clinic review tool, if applicable. No additional management support is needed unless otherwise documented below in the visit note. 

## 2015-05-06 ENCOUNTER — Telehealth: Payer: Self-pay | Admitting: *Deleted

## 2015-05-06 NOTE — Telephone Encounter (Signed)
Receive call pt states she is needing md to fax over a letter to her church stating that she is not well enough to go on the trip next week. Need letter sent to [facility] fax# [phone]. Generated letter had md to sign fax to # given....Johny Chess

## 2015-05-07 ENCOUNTER — Other Ambulatory Visit: Payer: Self-pay | Admitting: Internal Medicine

## 2015-05-07 ENCOUNTER — Telehealth: Payer: Self-pay | Admitting: Oncology

## 2015-05-07 NOTE — Telephone Encounter (Signed)
Returned patient call re r/s appointment. Per patient moved 11/2 f/u 11/1. Patient has new date/time.

## 2015-05-25 ENCOUNTER — Other Ambulatory Visit (HOSPITAL_BASED_OUTPATIENT_CLINIC_OR_DEPARTMENT_OTHER): Payer: Medicare Other

## 2015-05-25 ENCOUNTER — Ambulatory Visit (HOSPITAL_BASED_OUTPATIENT_CLINIC_OR_DEPARTMENT_OTHER): Payer: Medicare Other

## 2015-05-25 VITALS — BP 119/64 | HR 69 | Temp 98.3°F | Resp 18

## 2015-05-25 DIAGNOSIS — C9110 Chronic lymphocytic leukemia of B-cell type not having achieved remission: Secondary | ICD-10-CM

## 2015-05-25 DIAGNOSIS — D801 Nonfamilial hypogammaglobulinemia: Secondary | ICD-10-CM

## 2015-05-25 LAB — CBC WITH DIFFERENTIAL/PLATELET
BASO%: 1 % (ref 0.0–2.0)
Basophils Absolute: 0.1 10e3/uL (ref 0.0–0.1)
EOS%: 2.8 % (ref 0.0–7.0)
Eosinophils Absolute: 0.1 10e3/uL (ref 0.0–0.5)
HCT: 41 % (ref 34.8–46.6)
HGB: 13.7 g/dL (ref 11.6–15.9)
LYMPH%: 40.9 % (ref 14.0–49.7)
MCH: 31.1 pg (ref 25.1–34.0)
MCHC: 33.3 g/dL (ref 31.5–36.0)
MCV: 93.2 fL (ref 79.5–101.0)
MONO#: 0.4 10e3/uL (ref 0.1–0.9)
MONO%: 8.5 % (ref 0.0–14.0)
NEUT#: 2.5 10e3/uL (ref 1.5–6.5)
NEUT%: 46.8 % (ref 38.4–76.8)
Platelets: 164 10e3/uL (ref 145–400)
RBC: 4.4 10e6/uL (ref 3.70–5.45)
RDW: 14.1 % (ref 11.2–14.5)
WBC: 5.3 10e3/uL (ref 3.9–10.3)
lymph#: 2.2 10e3/uL (ref 0.9–3.3)

## 2015-05-25 MED ORDER — IMMUNE GLOBULIN (HUMAN) 10 GM/100ML IV SOLN
30.0000 g | Freq: Once | INTRAVENOUS | Status: AC
  Administered 2015-05-25: 30 g via INTRAVENOUS
  Filled 2015-05-25: qty 100

## 2015-05-25 MED ORDER — DIPHENHYDRAMINE HCL 25 MG PO CAPS
ORAL_CAPSULE | ORAL | Status: AC
  Filled 2015-05-25: qty 1

## 2015-05-25 MED ORDER — DIPHENHYDRAMINE HCL 25 MG PO CAPS
25.0000 mg | ORAL_CAPSULE | Freq: Once | ORAL | Status: AC
  Administered 2015-05-25: 25 mg via ORAL

## 2015-05-25 MED ORDER — IMMUNE GLOBULIN (HUMAN) 10 GM/100ML IV SOLN
30.0000 g | Freq: Once | INTRAVENOUS | Status: DC
  Filled 2015-05-25: qty 300

## 2015-05-25 NOTE — Patient Instructions (Signed)

## 2015-05-26 ENCOUNTER — Ambulatory Visit: Payer: Medicare Other

## 2015-05-26 ENCOUNTER — Telehealth: Payer: Self-pay | Admitting: Internal Medicine

## 2015-05-26 ENCOUNTER — Other Ambulatory Visit: Payer: Medicare Other

## 2015-05-26 ENCOUNTER — Telehealth: Payer: Self-pay | Admitting: Oncology

## 2015-05-26 NOTE — Telephone Encounter (Signed)
Please advise, thanks.

## 2015-05-26 NOTE — Telephone Encounter (Signed)
Patient suspects that she might have bed bugs, as her friend also had them. She is experiencing small bites all over her left arm. She asks if there is a blood test that can be done for bed bugs.

## 2015-05-26 NOTE — Telephone Encounter (Signed)
Returned patient call re r/s appointment. Per patient r/s 11/30 appointments to 11/29. Patient has new date/time. Other appointments remain the same.

## 2015-05-27 ENCOUNTER — Ambulatory Visit: Payer: Medicare Other

## 2015-05-27 ENCOUNTER — Other Ambulatory Visit: Payer: Medicare Other

## 2015-05-27 NOTE — Telephone Encounter (Signed)
There is no blood tests Sorry!

## 2015-05-27 NOTE — Telephone Encounter (Signed)
Patient advised of dr plotnikovs note, patient advised that she needs to schedule office visit so that we can look at bites, patient is to call back tomorrow after she checks her calendar to schedule appt

## 2015-06-21 ENCOUNTER — Other Ambulatory Visit: Payer: Self-pay | Admitting: *Deleted

## 2015-06-21 ENCOUNTER — Telehealth: Payer: Self-pay | Admitting: Oncology

## 2015-06-21 NOTE — Telephone Encounter (Signed)
Appoint for lab 11/29 cancelled per pof and patient is aware

## 2015-06-22 ENCOUNTER — Other Ambulatory Visit: Payer: Medicare Other

## 2015-06-22 ENCOUNTER — Ambulatory Visit (HOSPITAL_BASED_OUTPATIENT_CLINIC_OR_DEPARTMENT_OTHER): Payer: Medicare Other

## 2015-06-22 VITALS — BP 129/66 | HR 60 | Temp 98.7°F | Resp 16

## 2015-06-22 DIAGNOSIS — C9110 Chronic lymphocytic leukemia of B-cell type not having achieved remission: Secondary | ICD-10-CM

## 2015-06-22 DIAGNOSIS — D801 Nonfamilial hypogammaglobulinemia: Secondary | ICD-10-CM

## 2015-06-22 MED ORDER — IMMUNE GLOBULIN (HUMAN) 10 GM/100ML IV SOLN
30.0000 g | Freq: Once | INTRAVENOUS | Status: AC
  Administered 2015-06-22: 30 g via INTRAVENOUS
  Filled 2015-06-22: qty 300

## 2015-06-22 MED ORDER — DIPHENHYDRAMINE HCL 25 MG PO CAPS
25.0000 mg | ORAL_CAPSULE | Freq: Once | ORAL | Status: AC
  Administered 2015-06-22: 25 mg via ORAL

## 2015-06-22 MED ORDER — DIPHENHYDRAMINE HCL 25 MG PO CAPS
ORAL_CAPSULE | ORAL | Status: AC
  Filled 2015-06-22: qty 1

## 2015-06-22 NOTE — Patient Instructions (Signed)

## 2015-06-23 ENCOUNTER — Ambulatory Visit: Payer: Medicare Other

## 2015-06-23 ENCOUNTER — Other Ambulatory Visit: Payer: Medicare Other

## 2015-06-24 ENCOUNTER — Other Ambulatory Visit: Payer: Medicare Other

## 2015-06-24 ENCOUNTER — Ambulatory Visit: Payer: Medicare Other

## 2015-07-02 ENCOUNTER — Telehealth: Payer: Self-pay | Admitting: Internal Medicine

## 2015-07-02 NOTE — Telephone Encounter (Signed)
Copy of back exercises mailed to pt. Pt informed

## 2015-07-02 NOTE — Telephone Encounter (Signed)
States that Dr. Alain Marion gave her a list of exercise for her back with pictures.  Patient states she has lost this and would like to know if she could get them mailed out to her.

## 2015-07-06 ENCOUNTER — Telehealth: Payer: Self-pay | Admitting: Oncology

## 2015-07-06 NOTE — Telephone Encounter (Signed)
DUE TO LT OUT MOVD 12/28 F/U TO [facility] AND ADJUSTED ASSOCIATED APPOINTMENTS. LEFT MESSAGE FOR PATIENT RE CHANGE WITH NEW TIME FOR 12/28 @ 8:45 AM. SCHEDULE MAILED.

## 2015-07-08 ENCOUNTER — Other Ambulatory Visit: Payer: Self-pay | Admitting: Internal Medicine

## 2015-07-09 ENCOUNTER — Other Ambulatory Visit: Payer: Self-pay | Admitting: *Deleted

## 2015-07-09 MED ORDER — PANTOPRAZOLE SODIUM 40 MG PO TBEC
40.0000 mg | DELAYED_RELEASE_TABLET | Freq: Two times a day (BID) | ORAL | Status: DC
Start: 2015-07-09 — End: 2015-08-05

## 2015-07-09 MED ORDER — PANTOPRAZOLE SODIUM 40 MG PO TBEC: 40.0000 mg | DELAYED_RELEASE_TABLET | Freq: Two times a day (BID) | ORAL | Status: DC

## 2015-07-09 MED ORDER — LORATADINE 10 MG PO TABS: 10.0000 mg | ORAL_TABLET | Freq: Every day | ORAL | Status: DC

## 2015-07-13 ENCOUNTER — Telehealth: Payer: Self-pay | Admitting: *Deleted

## 2015-07-14 NOTE — Telephone Encounter (Signed)
Spoke with patient yesterday wanting to know if she was sup[pose to be taking the HCTZ. Inform pt yes per chart md sent rx to her pharmacy. She is suppose to be taking both amlodipine & HCTZ. Pt stated just wanted to make sure when she check her BP that morning it was 154/76. Advise pt to pick rx up and start taking...Johny Chess

## 2015-07-20 ENCOUNTER — Other Ambulatory Visit: Payer: Self-pay | Admitting: Oncology

## 2015-07-20 DIAGNOSIS — C9110 Chronic lymphocytic leukemia of B-cell type not having achieved remission: Secondary | ICD-10-CM

## 2015-07-21 ENCOUNTER — Telehealth: Payer: Self-pay | Admitting: Oncology

## 2015-07-21 ENCOUNTER — Encounter: Payer: Self-pay | Admitting: Oncology

## 2015-07-21 ENCOUNTER — Other Ambulatory Visit (HOSPITAL_BASED_OUTPATIENT_CLINIC_OR_DEPARTMENT_OTHER): Payer: Medicare Other

## 2015-07-21 ENCOUNTER — Ambulatory Visit (HOSPITAL_BASED_OUTPATIENT_CLINIC_OR_DEPARTMENT_OTHER): Payer: Medicare Other | Admitting: Oncology

## 2015-07-21 ENCOUNTER — Telehealth: Payer: Self-pay | Admitting: *Deleted

## 2015-07-21 ENCOUNTER — Ambulatory Visit (HOSPITAL_BASED_OUTPATIENT_CLINIC_OR_DEPARTMENT_OTHER): Payer: Medicare Other

## 2015-07-21 VITALS — BP 125/68 | HR 57 | Temp 99.1°F | Resp 18

## 2015-07-21 VITALS — BP 139/68 | HR 66 | Temp 98.0°F | Resp 20 | Ht 62.5 in | Wt 142.2 lb

## 2015-07-21 DIAGNOSIS — C9110 Chronic lymphocytic leukemia of B-cell type not having achieved remission: Secondary | ICD-10-CM | POA: Diagnosis not present

## 2015-07-21 LAB — COMPREHENSIVE METABOLIC PANEL
ALBUMIN: 3.8 g/dL (ref 3.5–5.0)
ALK PHOS: 62 U/L (ref 40–150)
ALT: 14 U/L (ref 0–55)
ANION GAP: 8 meq/L (ref 3–11)
AST: 19 U/L (ref 5–34)
BUN: 14.9 mg/dL (ref 7.0–26.0)
CALCIUM: 9.8 mg/dL (ref 8.4–10.4)
CO2: 28 mEq/L (ref 22–29)
Chloride: 98 mEq/L (ref 98–109)
Creatinine: 1 mg/dL (ref 0.6–1.1)
EGFR: 53 mL/min/{1.73_m2} — AB (ref >90)
Glucose: 94 mg/dl (ref 70–140)
POTASSIUM: 3.9 meq/L (ref 3.5–5.1)
Sodium: 134 mEq/L — ABNORMAL LOW (ref 136–145)
Total Bilirubin: 0.37 mg/dL (ref 0.20–1.20)
Total Protein: 7.5 g/dL (ref 6.4–8.3)

## 2015-07-21 LAB — CBC WITH DIFFERENTIAL/PLATELET
BASO%: 0.4 % (ref 0.0–2.0)
BASOS ABS: 0 10*3/uL (ref 0.0–0.1)
EOS ABS: 0.1 10*3/uL (ref 0.0–0.5)
EOS%: 2.2 % (ref 0.0–7.0)
HEMATOCRIT: 40.6 % (ref 34.8–46.6)
HEMOGLOBIN: 14 g/dL (ref 11.6–15.9)
LYMPH#: 2.4 10*3/uL (ref 0.9–3.3)
LYMPH%: 43.6 % (ref 14.0–49.7)
MCH: 31.1 pg (ref 25.1–34.0)
MCHC: 34.5 g/dL (ref 31.5–36.0)
MCV: 90.2 fL (ref 79.5–101.0)
MONO#: 0.5 10*3/uL (ref 0.1–0.9)
MONO%: 9.8 % (ref 0.0–14.0)
NEUT#: 2.4 10*3/uL (ref 1.5–6.5)
NEUT%: 44 % (ref 38.4–76.8)
PLATELETS: 142 10*3/uL — AB (ref 145–400)
RBC: 4.5 10*6/uL (ref 3.70–5.45)
RDW: 13.6 % (ref 11.2–14.5)
WBC: 5.5 10*3/uL (ref 3.9–10.3)

## 2015-07-21 MED ORDER — DIPHENHYDRAMINE HCL 25 MG PO CAPS
25.0000 mg | ORAL_CAPSULE | Freq: Once | ORAL | Status: AC
  Administered 2015-07-21: 25 mg via ORAL

## 2015-07-21 MED ORDER — IMMUNE GLOBULIN (HUMAN) 10 GM/100ML IV SOLN
30.0000 g | Freq: Once | INTRAVENOUS | Status: AC
  Administered 2015-07-21: 30 g via INTRAVENOUS
  Filled 2015-07-21: qty 300

## 2015-07-21 MED ORDER — DIPHENHYDRAMINE HCL 25 MG PO CAPS
ORAL_CAPSULE | ORAL | Status: AC
  Filled 2015-07-21: qty 1

## 2015-07-21 MED ORDER — SODIUM CHLORIDE 0.9 % IV SOLN
Freq: Once | INTRAVENOUS | Status: AC
  Administered 2015-07-21: 10:00:00 via INTRAVENOUS

## 2015-07-21 NOTE — Telephone Encounter (Signed)
Per staff message and POF I have scheduled appts. Advised scheduler of appts. JMW  

## 2015-07-21 NOTE — Telephone Encounter (Signed)
Scheduled appt per pof AVS report printed.

## 2015-07-21 NOTE — Progress Notes (Signed)
  [facility] OFFICE PROGRESS NOTE   Diagnosis: CLL  INTERVAL HISTORY:   She returns as scheduled. She continues monthly IVIG. No recent infections. She reports generalized leg weakness, she is able to walk for 20 minutes. She sings in two choirs.   Objective:  Vital signs in last 24 hours:  Blood pressure 139/68, pulse 66, temperature 98 F (36.7 C), temperature source Oral, resp. rate 20, height 5' 2.5" (1.588 m), weight 142 lb 3.2 oz (64.501 kg), SpO2 98 %.    HEENT: Neck without mass Lymphatics: No cervical, supraclavicular, axillary, or inguinal nodes Resp: Lungs clear bilaterally Cardio: Regular rate and rhythm GI: No hepatomegaly, nontender Vascular: No leg edema Neurologic: The leg and foot strength are intact bilaterally  Lab Results:  Lab Results  Component Value Date   WBC 5.5 07/21/2015   HGB 14.0 07/21/2015   HCT 40.6 07/21/2015   MCV 90.2 07/21/2015   PLT 142* 07/21/2015   NEUTROABS 2.4 07/21/2015     Medications: I have reviewed the patient's current medications.  Assessment/Plan: 1. Chronic lymphocytic leukemia, asymptomatic. 2. History of hypogammaglobulinemia with recurrent infections. She continues monthly IVIG replacement therapy. 3. History of recurrent urinary tract infections. Coagulase-negative Staphylococcus infection August 2016 4. History of mild thrombocytopenia secondary to CLL.     Disposition:  [name] is stable from a hematologic standpoint. She will continue monthly IVIG.  [name] will be scheduled for an office visit in 3 months.  Mikey Bussing, DNP, AGPCNP-BC, AOCNP  07/21/2015  10:56 AM

## 2015-07-21 NOTE — Patient Instructions (Signed)

## 2015-07-22 ENCOUNTER — Other Ambulatory Visit: Payer: Medicare Other

## 2015-07-22 ENCOUNTER — Ambulatory Visit: Payer: Medicare Other

## 2015-07-22 ENCOUNTER — Ambulatory Visit: Payer: Medicare Other | Admitting: Nurse Practitioner

## 2015-07-22 ENCOUNTER — Telehealth: Payer: Self-pay | Admitting: Oncology

## 2015-07-22 NOTE — Telephone Encounter (Signed)
Patient called to change time for 3/28 appointment and inquire if tx has been added. Added mthly IVIG to mthly labs per 2nd 12/28 pof. Adjusted 3/28 appointments to 9 am due to transportation. Patient each appointment date/time.

## 2015-08-05 ENCOUNTER — Encounter: Payer: Self-pay | Admitting: Internal Medicine

## 2015-08-05 ENCOUNTER — Ambulatory Visit (INDEPENDENT_AMBULATORY_CARE_PROVIDER_SITE_OTHER): Payer: Medicare Other | Admitting: Internal Medicine

## 2015-08-05 VITALS — BP 140/60 | HR 80 | Wt 142.0 lb

## 2015-08-05 DIAGNOSIS — R110 Nausea: Secondary | ICD-10-CM

## 2015-08-05 DIAGNOSIS — C9110 Chronic lymphocytic leukemia of B-cell type not having achieved remission: Secondary | ICD-10-CM

## 2015-08-05 DIAGNOSIS — E785 Hyperlipidemia, unspecified: Secondary | ICD-10-CM

## 2015-08-05 DIAGNOSIS — I10 Essential (primary) hypertension: Secondary | ICD-10-CM

## 2015-08-05 DIAGNOSIS — E538 Deficiency of other specified B group vitamins: Secondary | ICD-10-CM

## 2015-08-05 MED ORDER — PANTOPRAZOLE SODIUM 40 MG PO TBEC: 40.0000 mg | DELAYED_RELEASE_TABLET | Freq: Every day | ORAL | Status: DC

## 2015-08-05 NOTE — Assessment & Plan Note (Signed)
On Zocor 

## 2015-08-05 NOTE — Assessment & Plan Note (Signed)
On B12 

## 2015-08-05 NOTE — Assessment & Plan Note (Signed)
Resolved

## 2015-08-05 NOTE — Assessment & Plan Note (Addendum)
On Amlodipne, HCTZ

## 2015-08-05 NOTE — Progress Notes (Signed)
Subjective:  Patient ID: [name], female    DOB: 1926-02-12  Age: 82 y.o. MRN: [id number]  CC: No chief complaint on file.   HPI MYKAEL HOUZE presents for HTN, GERD, dyslipidemia f/up.  Outpatient Prescriptions Prior to Visit  Medication Sig Dispense Refill  . acetaminophen (TYLENOL) 500 MG tablet Take 500 mg by mouth 2 (two) times daily.     Marland Kitchen amLODipine (NORVASC) 5 MG tablet TAKE 1 TABLET (5 MG TOTAL) BY MOUTH DAILY. 30 tablet 11  . aspirin 81 MG EC tablet Take 81 mg by mouth daily.      . B-D 3CC LUER-LOK SYR 25GX1" 25G X 1" 3 ML MISC USE TO ADMINISTER B12 INJECTIONS 10 each 2  . B-D INS SYR ULTRAFINE 1CC/30G 30G X 1/2" 1 ML MISC USE TO ADMINISTER B12 INJECTIONS 10 each 1  . Cholecalciferol (EQL VITAMIN D3) 1000 UNITS tablet Take 1,000 Units by mouth daily.      . cyanocobalamin (,VITAMIN B-12,) 1000 MCG/ML injection ADMINISTER 1 CC SUBQ EVERY 4 WEEKS AS DIRECTED 10 mL 1  . folic acid (FOLVITE) 1 MG tablet Take 1 mg by mouth daily. For restless legs    . gabapentin (NEURONTIN) 300 MG capsule TAKE ONE CAPSULE BY MOUTH EVERY DAY 90 capsule 2  . hydrochlorothiazide (MICROZIDE) 12.5 MG capsule TAKE ONE CAPSULE BY MOUTH EVERY DAY FOR BLOOD PRESSURE 90 capsule 2  . Insulin Syringe-Needle U-100 (B-D INS SYRINGE 0.5CC/30GX1/2") 30G X 1/2" 0.5 ML MISC by Does not apply route. To administer B12 injections     . latanoprost (XALATAN) 0.005 % ophthalmic solution Place 1 drop into both eyes at bedtime.     Marland Kitchen loratadine (CLARITIN) 10 MG tablet Take 1 tablet (10 mg total) by mouth daily. 90 tablet 3  . ondansetron (ZOFRAN) 4 MG tablet Take 1 tablet (4 mg total) by mouth every 8 (eight) hours as needed for nausea or vomiting. 20 tablet 1  . pantoprazole (PROTONIX) 40 MG tablet Take 1 tablet (40 mg total) by mouth 2 (two) times daily. (Patient taking differently: Take 40 mg by mouth daily. ) 180 tablet 3  . potassium chloride (KLOR-CON M10) 10 MEQ tablet Take 1 tablet (10 mEq total) by mouth  daily. 90 tablet 3  . omeprazole (PRILOSEC) 40 MG capsule TAKE 1 CAPSULE (40 MG TOTAL) BY MOUTH DAILY. 90 capsule 2  . simvastatin (ZOCOR) 20 MG tablet Take 10 mg by mouth daily. Reported on 08/05/2015    . sucralfate (CARAFATE) 1 GM/10ML suspension Take 10 mLs (1 g total) by mouth 4 (four) times daily -  with meals and at bedtime. 420 mL 0   No facility-administered medications prior to visit.    ROS Review of Systems  Constitutional: Positive for fatigue. Negative for chills, activity change, appetite change and unexpected weight change.  HENT: Negative for congestion, mouth sores and sinus pressure.   Eyes: Negative for visual disturbance.  Respiratory: Negative for cough and chest tightness.   Gastrointestinal: Negative for nausea and abdominal pain.  Genitourinary: Negative for frequency, difficulty urinating and vaginal pain.  Musculoskeletal: Negative for back pain and gait problem.  Skin: Negative for pallor and rash.  Neurological: Negative for dizziness, tremors, weakness, numbness and headaches.  Psychiatric/Behavioral: Negative for confusion and sleep disturbance. The patient is not nervous/anxious.     Objective:  BP 140/60 mmHg  Pulse 80  Wt 142 lb (64.411 kg)  SpO2 95%  BP Readings from Last 3 Encounters:  08/05/15 140/60  07/21/15 125/68  07/21/15 139/68    Wt Readings from Last 3 Encounters:  08/05/15 142 lb (64.411 kg)  07/21/15 142 lb 3.2 oz (64.501 kg)  05/05/15 140 lb (63.504 kg)    Physical Exam  Constitutional: She appears well-developed. No distress.  HENT:  Head: Normocephalic.  Right Ear: External ear normal.  Left Ear: External ear normal.  Nose: Nose normal.  Mouth/Throat: Oropharynx is clear and moist.  Eyes: Conjunctivae are normal. Pupils are equal, round, and reactive to light. Right eye exhibits no discharge. Left eye exhibits no discharge.  Neck: Normal range of motion. Neck supple. No JVD present. No tracheal deviation present. No  thyromegaly present.  Cardiovascular: Normal rate, regular rhythm and normal heart sounds.   Pulmonary/Chest: No stridor. No respiratory distress. She has no wheezes.  Abdominal: Soft. Bowel sounds are normal. She exhibits no distension and no mass. There is no tenderness. There is no rebound and no guarding.  Musculoskeletal: She exhibits no edema or tenderness.  Lymphadenopathy:    She has no cervical adenopathy.  Neurological: She displays normal reflexes. No cranial nerve deficit. She exhibits normal muscle tone. Coordination abnormal.  Skin: No rash noted. No erythema.  Psychiatric: She has a normal mood and affect. Her behavior is normal. Judgment and thought content normal.    Lab Results  Component Value Date   WBC 5.5 07/21/2015   HGB 14.0 07/21/2015   HCT 40.6 07/21/2015   PLT 142* 07/21/2015   GLUCOSE 94 07/21/2015   CHOL 191 10/01/2014   TRIG 72.0 10/01/2014   HDL 57.80 10/01/2014   LDLCALC 119* 10/01/2014   ALT 14 07/21/2015   AST 19 07/21/2015   NA 134* 07/21/2015   K 3.9 07/21/2015   CL 100 05/05/2015   CREATININE 1.0 07/21/2015   BUN 14.9 07/21/2015   CO2 28 07/21/2015   TSH 2.629 02/08/2015   INR 1.0 10/07/2008   HGBA1C  10/07/2008    5.9 (NOTE)   The ADA recommends the following therapeutic goal for glycemic   control related to Hgb A1C measurement:   Goal of Therapy:   < 7.0% Hgb A1C   Reference: American Diabetes Association: Clinical Practice   Recommendations 2008, Diabetes Care,  2008, 31:(Suppl 1).   MICROALBUR 0.4 07/14/2009    Dg Chest 2 View  02/07/2015  CLINICAL DATA:  82 year old female with chest pain EXAM: CHEST  2 VIEW COMPARISON:  Radiograph dated 10/07/2008 FINDINGS: Two views of the chest demonstrate emphysematous changes of the lungs. There is no focal consolidation, pleural effusion, or pneumothorax. The cardiomediastinal silhouette is within normal limits. The osseous structures are grossly unremarkable. IMPRESSION: No active  cardiopulmonary disease. Electronically Signed   By: Anner Crete M.D.   On: 02/07/2015 23:01   US Abdomen Complete  02/08/2015  CLINICAL DATA:  Abdominal pain and nausea for the past 10 days, history of leukemia EXAM: ULTRASOUND ABDOMEN COMPLETE COMPARISON:  None. FINDINGS: The study is technically limited due to excessive bowel gas. Gallbladder: No gallstones or wall thickening visualized. No sonographic Murphy sign noted. Common bile duct: Diameter: 3 mm Liver: No focal lesion identified. Within normal limits in parenchymal echogenicity. There is a hypoechoic/ anechoic focus which lies between the right lobe of the liver in the right kidney. It measures 2.3 x 1.5 x 1.9 cm. IVC: The IVC appears mildly distended. There are no abnormal intraluminal echoes. Pancreas: Visualized portion unremarkable. Spleen: Size and appearance within normal limits. Right Kidney: Length: 10.7 cm. Echogenicity within normal  limits. No mass or hydronephrosis visualized. Left Kidney: Length: 11.4 cm. Echogenicity within normal limits. No mass or hydronephrosis visualized. Abdominal aorta: The aorta it exhibits normal caliber. The bifurcation could not be assessed due to bowel gas. Other findings: None. IMPRESSION: 1. The study is limited due to bowel gas. 2. No definite acute hepatobiliary abnormality is demonstrated. There is a cystic appearing structure measuring up to 2.3 cm in diameter were which lies in the soft tissues between the right hepatic lobe in the right kidney. 3. Mild dilation of the IVC which suggests increased right heart pressures. 4. Given the increased abdominal gas and the limitations on this study, abdominal and pelvic CT scanning would be useful. Electronically Signed   By: David  Martinique M.D.   On: 02/08/2015 14:38    Assessment & Plan:   Diagnoses and all orders for this visit:  Essential hypertension  B12 deficiency  Chronic lymphocytic leukemia (HCC)  Dyslipidemia  Nausea without  vomiting  I have discontinued Ms. Palo's sucralfate and omeprazole. I am also having her maintain her Insulin Syringe-Needle U-100, aspirin, acetaminophen, Cholecalciferol, latanoprost, B-D INS SYR ULTRAFINE 123XX123, folic acid, B-D 3CC LUER-LOK SYR 25GX1", amLODipine, ondansetron, potassium chloride, simvastatin, gabapentin, hydrochlorothiazide, cyanocobalamin, pantoprazole, and loratadine.  No orders of the defined types were placed in this encounter.     Follow-up: Return in about 4 months (around 12/03/2015) for a follow-up visit.  Walker Kehr, MD

## 2015-08-05 NOTE — Progress Notes (Signed)
Pre visit review using our clinic review tool, if applicable. No additional management support is needed unless otherwise documented below in the visit note. 

## 2015-08-05 NOTE — Assessment & Plan Note (Signed)
Dr Benay Spice f/u

## 2015-08-24 ENCOUNTER — Other Ambulatory Visit (HOSPITAL_BASED_OUTPATIENT_CLINIC_OR_DEPARTMENT_OTHER): Payer: Medicare Other

## 2015-08-24 ENCOUNTER — Ambulatory Visit (HOSPITAL_BASED_OUTPATIENT_CLINIC_OR_DEPARTMENT_OTHER): Payer: Medicare Other

## 2015-08-24 VITALS — BP 124/65 | HR 68 | Temp 98.4°F | Resp 20

## 2015-08-24 DIAGNOSIS — C9110 Chronic lymphocytic leukemia of B-cell type not having achieved remission: Secondary | ICD-10-CM | POA: Diagnosis not present

## 2015-08-24 DIAGNOSIS — D801 Nonfamilial hypogammaglobulinemia: Secondary | ICD-10-CM

## 2015-08-24 LAB — BASIC METABOLIC PANEL
Anion Gap: 11 mEq/L (ref 3–11)
BUN: 18.9 mg/dL (ref 7.0–26.0)
CALCIUM: 9.8 mg/dL (ref 8.4–10.4)
CHLORIDE: 101 meq/L (ref 98–109)
CO2: 23 meq/L (ref 22–29)
Creatinine: 0.9 mg/dL (ref 0.6–1.1)
EGFR: 60 mL/min/{1.73_m2} — AB (ref >90)
GLUCOSE: 107 mg/dL (ref 70–140)
Potassium: 4.1 mEq/L (ref 3.5–5.1)
Sodium: 135 mEq/L — ABNORMAL LOW (ref 136–145)

## 2015-08-24 LAB — CBC WITH DIFFERENTIAL/PLATELET
BASO%: 0.6 % (ref 0.0–2.0)
BASOS ABS: 0 10*3/uL (ref 0.0–0.1)
EOS ABS: 0.1 10*3/uL (ref 0.0–0.5)
EOS%: 0.9 % (ref 0.0–7.0)
HEMATOCRIT: 44.6 % (ref 34.8–46.6)
HEMOGLOBIN: 14.7 g/dL (ref 11.6–15.9)
LYMPH#: 2.1 10*3/uL (ref 0.9–3.3)
LYMPH%: 29.5 % (ref 14.0–49.7)
MCH: 30.1 pg (ref 25.1–34.0)
MCHC: 33 g/dL (ref 31.5–36.0)
MCV: 91.3 fL (ref 79.5–101.0)
MONO#: 0.4 10*3/uL (ref 0.1–0.9)
MONO%: 6.2 % (ref 0.0–14.0)
NEUT#: 4.5 10*3/uL (ref 1.5–6.5)
NEUT%: 62.8 % (ref 38.4–76.8)
Platelets: 158 10*3/uL (ref 145–400)
RBC: 4.89 10*6/uL (ref 3.70–5.45)
RDW: 13.6 % (ref 11.2–14.5)
WBC: 7.2 10*3/uL (ref 3.9–10.3)

## 2015-08-24 MED ORDER — DIPHENHYDRAMINE HCL 25 MG PO CAPS
ORAL_CAPSULE | ORAL | Status: AC
  Filled 2015-08-24: qty 1

## 2015-08-24 MED ORDER — DIPHENHYDRAMINE HCL 25 MG PO CAPS
25.0000 mg | ORAL_CAPSULE | Freq: Once | ORAL | Status: AC
  Administered 2015-08-24: 25 mg via ORAL

## 2015-08-24 MED ORDER — SODIUM CHLORIDE 0.9 % IV SOLN
Freq: Once | INTRAVENOUS | Status: AC
Start: 2015-08-24 — End: 2015-08-24
  Administered 2015-08-24: 11:00:00 via INTRAVENOUS

## 2015-08-24 MED ORDER — IMMUNE GLOBULIN (HUMAN) 10 GM/100ML IV SOLN
30.0000 g | Freq: Once | INTRAVENOUS | Status: AC
  Administered 2015-08-24: 30 g via INTRAVENOUS
  Filled 2015-08-24: qty 300

## 2015-08-24 NOTE — Patient Instructions (Signed)

## 2015-08-25 LAB — IGG, IGA, IGM
IGA/IMMUNOGLOBULIN A, SERUM: 273 mg/dL (ref 64–422)
IgM, Qn, Serum: 48 mg/dL (ref 26–217)

## 2015-09-21 ENCOUNTER — Other Ambulatory Visit (HOSPITAL_BASED_OUTPATIENT_CLINIC_OR_DEPARTMENT_OTHER): Payer: Medicare Other

## 2015-09-21 ENCOUNTER — Ambulatory Visit (HOSPITAL_BASED_OUTPATIENT_CLINIC_OR_DEPARTMENT_OTHER): Payer: Medicare Other

## 2015-09-21 VITALS — BP 114/63 | HR 72 | Temp 98.8°F | Resp 20

## 2015-09-21 DIAGNOSIS — C9110 Chronic lymphocytic leukemia of B-cell type not having achieved remission: Secondary | ICD-10-CM | POA: Diagnosis not present

## 2015-09-21 DIAGNOSIS — D801 Nonfamilial hypogammaglobulinemia: Secondary | ICD-10-CM | POA: Diagnosis not present

## 2015-09-21 LAB — BASIC METABOLIC PANEL
Anion Gap: 10 mEq/L (ref 3–11)
BUN: 16.4 mg/dL (ref 7.0–26.0)
CALCIUM: 9.6 mg/dL (ref 8.4–10.4)
CHLORIDE: 99 meq/L (ref 98–109)
CO2: 24 meq/L (ref 22–29)
CREATININE: 0.9 mg/dL (ref 0.6–1.1)
EGFR: 55 mL/min/{1.73_m2} — ABNORMAL LOW (ref >90)
GLUCOSE: 92 mg/dL (ref 70–140)
Potassium: 4.1 mEq/L (ref 3.5–5.1)
Sodium: 133 mEq/L — ABNORMAL LOW (ref 136–145)

## 2015-09-21 LAB — CBC WITH DIFFERENTIAL/PLATELET
BASO%: 0.7 % (ref 0.0–2.0)
Basophils Absolute: 0 10*3/uL (ref 0.0–0.1)
EOS%: 2 % (ref 0.0–7.0)
Eosinophils Absolute: 0.1 10*3/uL (ref 0.0–0.5)
HCT: 43.4 % (ref 34.8–46.6)
HEMOGLOBIN: 14.3 g/dL (ref 11.6–15.9)
LYMPH%: 30.6 % (ref 14.0–49.7)
MCH: 30.4 pg (ref 25.1–34.0)
MCHC: 33 g/dL (ref 31.5–36.0)
MCV: 92.1 fL (ref 79.5–101.0)
MONO#: 0.5 10*3/uL (ref 0.1–0.9)
MONO%: 8.8 % (ref 0.0–14.0)
NEUT%: 57.9 % (ref 38.4–76.8)
NEUTROS ABS: 3.3 10*3/uL (ref 1.5–6.5)
Platelets: 136 10*3/uL — ABNORMAL LOW (ref 145–400)
RBC: 4.71 10*6/uL (ref 3.70–5.45)
RDW: 14.1 % (ref 11.2–14.5)
WBC: 5.6 10*3/uL (ref 3.9–10.3)
lymph#: 1.7 10*3/uL (ref 0.9–3.3)

## 2015-09-21 MED ORDER — IMMUNE GLOBULIN (HUMAN) 10 GM/100ML IV SOLN
30.0000 g | Freq: Once | INTRAVENOUS | Status: AC
Start: 2015-09-21 — End: 2015-09-21
  Administered 2015-09-21: 30 g via INTRAVENOUS
  Filled 2015-09-21: qty 300

## 2015-09-21 MED ORDER — DIPHENHYDRAMINE HCL 25 MG PO CAPS
25.0000 mg | ORAL_CAPSULE | Freq: Once | ORAL | Status: AC
  Administered 2015-09-21: 25 mg via ORAL

## 2015-09-21 MED ORDER — DIPHENHYDRAMINE HCL 25 MG PO CAPS
ORAL_CAPSULE | ORAL | Status: AC
  Filled 2015-09-21: qty 1

## 2015-09-21 MED ORDER — SODIUM CHLORIDE 0.9 % IV SOLN
Freq: Once | INTRAVENOUS | Status: AC
  Administered 2015-09-21: 10:00:00 via INTRAVENOUS

## 2015-09-21 NOTE — Patient Instructions (Signed)

## 2015-10-01 ENCOUNTER — Encounter: Payer: Self-pay | Admitting: Nurse Practitioner

## 2015-10-19 ENCOUNTER — Other Ambulatory Visit (HOSPITAL_BASED_OUTPATIENT_CLINIC_OR_DEPARTMENT_OTHER): Payer: Medicare Other

## 2015-10-19 ENCOUNTER — Telehealth: Payer: Self-pay | Admitting: Oncology

## 2015-10-19 ENCOUNTER — Ambulatory Visit (HOSPITAL_BASED_OUTPATIENT_CLINIC_OR_DEPARTMENT_OTHER): Payer: Medicare Other | Admitting: Oncology

## 2015-10-19 ENCOUNTER — Ambulatory Visit (HOSPITAL_BASED_OUTPATIENT_CLINIC_OR_DEPARTMENT_OTHER): Payer: Medicare Other

## 2015-10-19 VITALS — BP 126/60 | HR 67 | Temp 97.1°F | Resp 18

## 2015-10-19 VITALS — BP 127/63 | HR 71 | Temp 98.0°F | Resp 18 | Ht 62.5 in | Wt 146.4 lb

## 2015-10-19 DIAGNOSIS — C9110 Chronic lymphocytic leukemia of B-cell type not having achieved remission: Secondary | ICD-10-CM | POA: Diagnosis not present

## 2015-10-19 DIAGNOSIS — M79602 Pain in left arm: Secondary | ICD-10-CM

## 2015-10-19 LAB — CBC WITH DIFFERENTIAL/PLATELET
BASO%: 0.7 % (ref 0.0–2.0)
BASOS ABS: 0 10*3/uL (ref 0.0–0.1)
EOS ABS: 0.2 10*3/uL (ref 0.0–0.5)
EOS%: 2.5 % (ref 0.0–7.0)
HCT: 42.2 % (ref 34.8–46.6)
HGB: 13.7 g/dL (ref 11.6–15.9)
LYMPH%: 24.3 % (ref 14.0–49.7)
MCH: 30.1 pg (ref 25.1–34.0)
MCHC: 32.6 g/dL (ref 31.5–36.0)
MCV: 92.5 fL (ref 79.5–101.0)
MONO#: 0.6 10*3/uL (ref 0.1–0.9)
MONO%: 7.8 % (ref 0.0–14.0)
NEUT#: 4.8 10*3/uL (ref 1.5–6.5)
NEUT%: 64.7 % (ref 38.4–76.8)
PLATELETS: 161 10*3/uL (ref 145–400)
RBC: 4.56 10*6/uL (ref 3.70–5.45)
RDW: 14.2 % (ref 11.2–14.5)
WBC: 7.5 10*3/uL (ref 3.9–10.3)
lymph#: 1.8 10*3/uL (ref 0.9–3.3)

## 2015-10-19 LAB — COMPREHENSIVE METABOLIC PANEL
ALK PHOS: 62 U/L (ref 40–150)
ALT: 14 U/L (ref 0–55)
AST: 18 U/L (ref 5–34)
Albumin: 3.7 g/dL (ref 3.5–5.0)
Anion Gap: 10 mEq/L (ref 3–11)
BILIRUBIN TOTAL: 0.42 mg/dL (ref 0.20–1.20)
BUN: 20.4 mg/dL (ref 7.0–26.0)
CHLORIDE: 102 meq/L (ref 98–109)
CO2: 25 mEq/L (ref 22–29)
Calcium: 9.6 mg/dL (ref 8.4–10.4)
Creatinine: 0.9 mg/dL (ref 0.6–1.1)
EGFR: 59 mL/min/{1.73_m2} — AB (ref >90)
GLUCOSE: 89 mg/dL (ref 70–140)
Potassium: 4.1 mEq/L (ref 3.5–5.1)
SODIUM: 137 meq/L (ref 136–145)
TOTAL PROTEIN: 7.7 g/dL (ref 6.4–8.3)

## 2015-10-19 MED ORDER — DIPHENHYDRAMINE HCL 25 MG PO CAPS
ORAL_CAPSULE | ORAL | Status: AC
  Filled 2015-10-19: qty 1

## 2015-10-19 MED ORDER — IMMUNE GLOBULIN (HUMAN) 10 GM/100ML IV SOLN
30.0000 g | Freq: Once | INTRAVENOUS | Status: AC
  Administered 2015-10-19: 30 g via INTRAVENOUS
  Filled 2015-10-19: qty 200

## 2015-10-19 MED ORDER — SODIUM CHLORIDE 0.9 % IV SOLN
Freq: Once | INTRAVENOUS | Status: AC
  Administered 2015-10-19: 10:00:00 via INTRAVENOUS

## 2015-10-19 MED ORDER — DIPHENHYDRAMINE HCL 25 MG PO CAPS
25.0000 mg | ORAL_CAPSULE | Freq: Once | ORAL | Status: AC
  Administered 2015-10-19: 25 mg via ORAL

## 2015-10-19 NOTE — Progress Notes (Signed)
  [facility] OFFICE PROGRESS NOTE   Diagnosis: CLL  INTERVAL HISTORY:   Ms. Laminack from returns as scheduled. She continues monthly IVIG. No recent infection. Good appetite. She reports sweaty hands and sweating at the upper lip intermittently. She complains of a cold feeling at the left arm and hand for the past year. No neck pain. No weakness.  Objective:  Vital signs in last 24 hours:  Blood pressure 127/63, pulse 71, temperature 98 F (36.7 C), temperature source Oral, resp. rate 18, height 5' 2.5" (1.588 m), weight 146 lb 6.4 oz (66.407 kg), SpO2 98 %.    HEENT: Neck without mass Lymphatics: No cervical, supraclavicular, or axillary nodes Resp: Lungs clear bilaterally Cardio: Regular rate and rhythm GI: No hepatosplenomegaly Vascular: The right lower leg is slightly larger than the left side, no edema. The left radial pulse is intact with good capillary refill at the left fingers Neuro: The motor exam appears intact in the arms and hands bilaterally    Lab Results:  Lab Results  Component Value Date   WBC 7.5 10/19/2015   HGB 13.7 10/19/2015   HCT 42.2 10/19/2015   MCV 92.5 10/19/2015   PLT 161 10/19/2015   NEUTROABS 4.8 10/19/2015   Absolute cycle 1.8  Medications: I have reviewed the patient's current medications.  Assessment/Plan: 1. Chronic lymphocytic leukemia, asymptomatic. 2. History of hypogammaglobulinemia with recurrent infections. She continues monthly IVIG replacement therapy. 3. History of recurrent urinary tract infections. Coagulase-negative Staphylococcus infection August 2016 4. History of mild thrombocytopenia secondary to CLL.    Disposition:  Ms. Shortall is stable from a hematologic standpoint. She will continue monthly IVIG. I recommended she see Dr. Alain Marion for pain or persistent coolness at the left arm and hand. I doubt this is related to the CLL.  Betsy Coder, MD  10/19/2015  9:35 AM

## 2015-10-19 NOTE — Patient Instructions (Signed)

## 2015-10-19 NOTE — Telephone Encounter (Signed)
per pof to sch pt appt-sent MW emailto sch trmt-pt to getupdated copy b4 leaving** °

## 2015-10-20 ENCOUNTER — Telehealth: Payer: Self-pay | Admitting: *Deleted

## 2015-10-20 NOTE — Telephone Encounter (Signed)
Late entry----received message yesterday from scheduler to schedule treatment appts. labas were scheduled three weeks instead of four weeks. Message sent back to scheduler to advise of correct date.

## 2015-10-21 ENCOUNTER — Telehealth: Payer: Self-pay | Admitting: *Deleted

## 2015-10-21 NOTE — Telephone Encounter (Signed)
Per staff message and POF I have scheduled appts. Advised scheduler of appts. JMW  

## 2015-11-09 ENCOUNTER — Telehealth: Payer: Self-pay | Admitting: Oncology

## 2015-11-09 ENCOUNTER — Other Ambulatory Visit (HOSPITAL_BASED_OUTPATIENT_CLINIC_OR_DEPARTMENT_OTHER): Payer: Medicare Other

## 2015-11-09 ENCOUNTER — Other Ambulatory Visit: Payer: Medicare Other

## 2015-11-09 ENCOUNTER — Encounter: Payer: Self-pay | Admitting: *Deleted

## 2015-11-09 DIAGNOSIS — C9110 Chronic lymphocytic leukemia of B-cell type not having achieved remission: Secondary | ICD-10-CM | POA: Diagnosis not present

## 2015-11-09 LAB — CBC WITH DIFFERENTIAL/PLATELET
BASO%: 0.6 % (ref 0.0–2.0)
Basophils Absolute: 0 10*3/uL (ref 0.0–0.1)
EOS ABS: 0.1 10*3/uL (ref 0.0–0.5)
EOS%: 1.7 % (ref 0.0–7.0)
HCT: 42.3 % (ref 34.8–46.6)
HEMOGLOBIN: 14 g/dL (ref 11.6–15.9)
LYMPH%: 25.5 % (ref 14.0–49.7)
MCH: 30.2 pg (ref 25.1–34.0)
MCHC: 33 g/dL (ref 31.5–36.0)
MCV: 91.4 fL (ref 79.5–101.0)
MONO#: 0.4 10*3/uL (ref 0.1–0.9)
MONO%: 6.9 % (ref 0.0–14.0)
NEUT%: 65.3 % (ref 38.4–76.8)
NEUTROS ABS: 4 10*3/uL (ref 1.5–6.5)
PLATELETS: 150 10*3/uL (ref 145–400)
RBC: 4.63 10*6/uL (ref 3.70–5.45)
RDW: 13.5 % (ref 11.2–14.5)
WBC: 6.1 10*3/uL (ref 3.9–10.3)
lymph#: 1.6 10*3/uL (ref 0.9–3.3)

## 2015-11-09 NOTE — Telephone Encounter (Signed)
Patient came in today for lab/inf that had been moved to 4/25. Patient sent to lab and given new schedule April thru July. Lab/fu for 7/11 moved to 7/18 with monthly infusion. Per 3/28 pof 4 month f/u was to be coord with monthly infusion date.

## 2015-11-09 NOTE — Telephone Encounter (Signed)
Pt requested time for 4/25 appt be changed to a earlier time... Time changed & gave printed sched

## 2015-11-16 ENCOUNTER — Other Ambulatory Visit: Payer: Medicare Other

## 2015-11-16 ENCOUNTER — Ambulatory Visit: Payer: Medicare Other

## 2015-11-16 ENCOUNTER — Ambulatory Visit (HOSPITAL_BASED_OUTPATIENT_CLINIC_OR_DEPARTMENT_OTHER): Payer: Medicare Other

## 2015-11-16 VITALS — BP 137/64 | HR 64 | Temp 98.3°F | Resp 18

## 2015-11-16 DIAGNOSIS — C9110 Chronic lymphocytic leukemia of B-cell type not having achieved remission: Secondary | ICD-10-CM | POA: Diagnosis not present

## 2015-11-16 MED ORDER — IMMUNE GLOBULIN (HUMAN) 10 GM/100ML IV SOLN: 30.0000 g | Freq: Once | INTRAVENOUS | Status: DC

## 2015-11-16 MED ORDER — IMMUNE GLOBULIN (HUMAN) 5 GM/100ML IV SOLN
30.0000 g | Freq: Once | INTRAVENOUS | Status: AC
  Administered 2015-11-16: 30 g via INTRAVENOUS
  Filled 2015-11-16: qty 600

## 2015-11-16 MED ORDER — DIPHENHYDRAMINE HCL 25 MG PO CAPS
25.0000 mg | ORAL_CAPSULE | Freq: Once | ORAL | Status: AC
  Administered 2015-11-16: 25 mg via ORAL

## 2015-11-16 MED ORDER — DIPHENHYDRAMINE HCL 25 MG PO CAPS
ORAL_CAPSULE | ORAL | Status: AC
  Filled 2015-11-16: qty 1

## 2015-11-16 NOTE — Patient Instructions (Signed)

## 2015-11-16 NOTE — Progress Notes (Signed)
Pharmacy Gerald Stabs) ordered 5% Octagam in place of 10% and gave alternate instructions for administration. Instructions were to run the 5% at the 10% rate for the first two rate increases and then double the 10% rate as tolerated for the rest of the infusion.  Ma Hillock, RN

## 2015-11-16 NOTE — Progress Notes (Addendum)
Due to expiring supply of 5% Octagam in Pharmacy stock we decided to exchange the 10% Octagam for 5%. This resulted in a volume of 600 cc for 30 g of Octagam (usual dose for patient). Since patient has tolerated octagam in the past we will use a faster rate to more match the 10% Octagam infusion rates as package insert for Octagam states that published rates can be adjusted after initial success and tolerability of Octagam has been established. Confirmed and verified rates with nursing staff Tammi Klippel J) who performed double check/verification before administration and rate increases. Patient tolerated well. Next infusion will be back to standard Octagam 10% with standard rates.

## 2015-12-03 ENCOUNTER — Encounter: Payer: Self-pay | Admitting: Internal Medicine

## 2015-12-03 ENCOUNTER — Other Ambulatory Visit (INDEPENDENT_AMBULATORY_CARE_PROVIDER_SITE_OTHER): Payer: Medicare Other

## 2015-12-03 ENCOUNTER — Ambulatory Visit (INDEPENDENT_AMBULATORY_CARE_PROVIDER_SITE_OTHER): Payer: Medicare Other | Admitting: Internal Medicine

## 2015-12-03 VITALS — BP 152/80 | HR 71 | Temp 97.9°F | Wt 143.0 lb

## 2015-12-03 DIAGNOSIS — I10 Essential (primary) hypertension: Secondary | ICD-10-CM

## 2015-12-03 DIAGNOSIS — E538 Deficiency of other specified B group vitamins: Secondary | ICD-10-CM

## 2015-12-03 DIAGNOSIS — N3090 Cystitis, unspecified without hematuria: Secondary | ICD-10-CM

## 2015-12-03 DIAGNOSIS — E871 Hypo-osmolality and hyponatremia: Secondary | ICD-10-CM

## 2015-12-03 DIAGNOSIS — C9110 Chronic lymphocytic leukemia of B-cell type not having achieved remission: Secondary | ICD-10-CM

## 2015-12-03 LAB — URINALYSIS
BILIRUBIN URINE: NEGATIVE
Hgb urine dipstick: NEGATIVE
KETONES UR: NEGATIVE
LEUKOCYTES UA: NEGATIVE
Nitrite: NEGATIVE
PH: 6 (ref 5.0–8.0)
Specific Gravity, Urine: 1.01 (ref 1.000–1.030)
Total Protein, Urine: NEGATIVE
URINE GLUCOSE: NEGATIVE
UROBILINOGEN UA: 0.2 (ref 0.0–1.0)

## 2015-12-03 LAB — BASIC METABOLIC PANEL
BUN: 20 mg/dL (ref 6–23)
CALCIUM: 10.1 mg/dL (ref 8.4–10.5)
CO2: 27 mEq/L (ref 19–32)
CREATININE: 0.77 mg/dL (ref 0.40–1.20)
Chloride: 99 mEq/L (ref 96–112)
GFR: 75.3 mL/min (ref >60.00)
Glucose, Bld: 100 mg/dL — ABNORMAL HIGH (ref 70–99)
Potassium: 4 mEq/L (ref 3.5–5.1)
SODIUM: 136 meq/L (ref 135–145)

## 2015-12-03 LAB — TSH: TSH: 3.38 u[IU]/mL (ref 0.35–4.50)

## 2015-12-03 MED ORDER — NITROFURANTOIN MONOHYD MACRO 100 MG PO CAPS: 100.0000 mg | ORAL_CAPSULE | Freq: Two times a day (BID) | ORAL | Status: DC

## 2015-12-03 NOTE — Assessment & Plan Note (Signed)
Octagam IV

## 2015-12-03 NOTE — Assessment & Plan Note (Signed)
On B12 

## 2015-12-03 NOTE — Progress Notes (Signed)
Pre visit review using our clinic review tool, if applicable. No additional management support is needed unless otherwise documented below in the visit note. 

## 2015-12-03 NOTE — Assessment & Plan Note (Signed)
UA

## 2015-12-03 NOTE — Assessment & Plan Note (Signed)
Labs

## 2015-12-03 NOTE — Progress Notes (Signed)
Subjective:  Patient ID: [name], female    DOB: 10/10/1925  Age: 82 y.o. MRN: [id number]  CC: No chief complaint on file.   HPI [name] presents for CLL, HTN, electrolyte dysbalance. C/o urinary smell   Outpatient Prescriptions Prior to Visit  Medication Sig Dispense Refill  . acetaminophen (TYLENOL) 500 MG tablet Take 500 mg by mouth 2 (two) times daily.     Marland Kitchen amLODipine (NORVASC) 5 MG tablet TAKE 1 TABLET (5 MG TOTAL) BY MOUTH DAILY. 30 tablet 11  . aspirin 81 MG EC tablet Take 81 mg by mouth daily.      . B-D 3CC LUER-LOK SYR 25GX1" 25G X 1" 3 ML MISC USE TO ADMINISTER B12 INJECTIONS 10 each 2  . B-D INS SYR ULTRAFINE 1CC/30G 30G X 1/2" 1 ML MISC USE TO ADMINISTER B12 INJECTIONS 10 each 1  . Cholecalciferol (EQL VITAMIN D3) 1000 UNITS tablet Take 1,000 Units by mouth daily.      . cyanocobalamin (,VITAMIN B-12,) 1000 MCG/ML injection ADMINISTER 1 CC SUBQ EVERY 4 WEEKS AS DIRECTED 10 mL 1  . folic acid (FOLVITE) 1 MG tablet Take 1 mg by mouth daily. For restless legs    . gabapentin (NEURONTIN) 300 MG capsule TAKE ONE CAPSULE BY MOUTH EVERY DAY 90 capsule 2  . hydrochlorothiazide (MICROZIDE) 12.5 MG capsule TAKE ONE CAPSULE BY MOUTH EVERY DAY FOR BLOOD PRESSURE 90 capsule 2  . Insulin Syringe-Needle U-100 (B-D INS SYRINGE 0.5CC/30GX1/2") 30G X 1/2" 0.5 ML MISC by Does not apply route. To administer B12 injections     . latanoprost (XALATAN) 0.005 % ophthalmic solution Place 1 drop into both eyes at bedtime.     Marland Kitchen loratadine (CLARITIN) 10 MG tablet Take 1 tablet (10 mg total) by mouth daily. 90 tablet 3  . omeprazole (PRILOSEC) 40 MG capsule Take 40 mg by mouth daily.  3  . pantoprazole (PROTONIX) 40 MG tablet Take 1 tablet (40 mg total) by mouth daily. 90 tablet 3  . potassium chloride (KLOR-CON M10) 10 MEQ tablet Take 1 tablet (10 mEq total) by mouth daily. 90 tablet 3  . simvastatin (ZOCOR) 20 MG tablet Take 10 mg by mouth daily. Reported on 08/05/2015     No  facility-administered medications prior to visit.    ROS Review of Systems  Constitutional: Positive for fatigue. Negative for chills, activity change, appetite change and unexpected weight change.  HENT: Negative for congestion, mouth sores and sinus pressure.   Eyes: Negative for visual disturbance.  Respiratory: Negative for cough and chest tightness.   Gastrointestinal: Negative for nausea and abdominal pain.  Genitourinary: Positive for urgency and frequency. Negative for difficulty urinating and vaginal pain.  Musculoskeletal: Negative for back pain and gait problem.  Skin: Negative for pallor and rash.  Neurological: Negative for dizziness, tremors, weakness, numbness and headaches.  Psychiatric/Behavioral: Negative for confusion and sleep disturbance.    Objective:  BP 152/80 mmHg  Pulse 71  Temp(Src) 97.9 F (36.6 C) (Oral)  Wt 143 lb (64.864 kg)  SpO2 94%  BP Readings from Last 3 Encounters:  12/03/15 152/80  11/16/15 137/64  10/19/15 126/60    Wt Readings from Last 3 Encounters:  12/03/15 143 lb (64.864 kg)  10/19/15 146 lb 6.4 oz (66.407 kg)  08/05/15 142 lb (64.411 kg)    Physical Exam  Constitutional: She appears well-developed. No distress.  HENT:  Head: Normocephalic.  Right Ear: External ear normal.  Left Ear: External ear normal.  Nose: Nose normal.  Mouth/Throat: Oropharynx is clear and moist.  Eyes: Conjunctivae are normal. Pupils are equal, round, and reactive to light. Right eye exhibits no discharge. Left eye exhibits no discharge.  Neck: Normal range of motion. Neck supple. No JVD present. No tracheal deviation present. No thyromegaly present.  Cardiovascular: Normal rate, regular rhythm and normal heart sounds.   Pulmonary/Chest: No stridor. No respiratory distress. She has no wheezes.  Abdominal: Soft. Bowel sounds are normal. She exhibits no distension and no mass. There is no tenderness. There is no rebound and no guarding.    Musculoskeletal: She exhibits no edema or tenderness.  Lymphadenopathy:    She has no cervical adenopathy.  Neurological: She displays normal reflexes. No cranial nerve deficit. She exhibits normal muscle tone. Coordination abnormal.  Skin: No rash noted. No erythema.  Psychiatric: She has a normal mood and affect. Her behavior is normal. Judgment and thought content normal.  cane  Lab Results  Component Value Date   WBC 6.1 11/09/2015   HGB 14.0 11/09/2015   HCT 42.3 11/09/2015   PLT 150 11/09/2015   GLUCOSE 89 10/19/2015   CHOL 191 10/01/2014   TRIG 72.0 10/01/2014   HDL 57.80 10/01/2014   LDLCALC 119* 10/01/2014   ALT 14 10/19/2015   AST 18 10/19/2015   NA 137 10/19/2015   K 4.1 10/19/2015   CL 100 05/05/2015   CREATININE 0.9 10/19/2015   BUN 20.4 10/19/2015   CO2 25 10/19/2015   TSH 2.629 02/08/2015   INR 1.0 10/07/2008   HGBA1C  10/07/2008    5.9 (NOTE)   The ADA recommends the following therapeutic goal for glycemic   control related to Hgb A1C measurement:   Goal of Therapy:   < 7.0% Hgb A1C   Reference: American Diabetes Association: Clinical Practice   Recommendations 2008, Diabetes Care,  2008, 31:(Suppl 1).   MICROALBUR 0.4 07/14/2009    Dg Chest 2 View  02/07/2015  CLINICAL DATA:  82 year old female with chest pain EXAM: CHEST  2 VIEW COMPARISON:  Radiograph dated 10/07/2008 FINDINGS: Two views of the chest demonstrate emphysematous changes of the lungs. There is no focal consolidation, pleural effusion, or pneumothorax. The cardiomediastinal silhouette is within normal limits. The osseous structures are grossly unremarkable. IMPRESSION: No active cardiopulmonary disease. Electronically Signed   By: Anner Crete M.D.   On: 02/07/2015 23:01   US Abdomen Complete  02/08/2015  CLINICAL DATA:  Abdominal pain and nausea for the past 10 days, history of leukemia EXAM: ULTRASOUND ABDOMEN COMPLETE COMPARISON:  None. FINDINGS: The study is technically limited due to  excessive bowel gas. Gallbladder: No gallstones or wall thickening visualized. No sonographic Murphy sign noted. Common bile duct: Diameter: 3 mm Liver: No focal lesion identified. Within normal limits in parenchymal echogenicity. There is a hypoechoic/ anechoic focus which lies between the right lobe of the liver in the right kidney. It measures 2.3 x 1.5 x 1.9 cm. IVC: The IVC appears mildly distended. There are no abnormal intraluminal echoes. Pancreas: Visualized portion unremarkable. Spleen: Size and appearance within normal limits. Right Kidney: Length: 10.7 cm. Echogenicity within normal limits. No mass or hydronephrosis visualized. Left Kidney: Length: 11.4 cm. Echogenicity within normal limits. No mass or hydronephrosis visualized. Abdominal aorta: The aorta it exhibits normal caliber. The bifurcation could not be assessed due to bowel gas. Other findings: None. IMPRESSION: 1. The study is limited due to bowel gas. 2. No definite acute hepatobiliary abnormality is demonstrated. There is a cystic  appearing structure measuring up to 2.3 cm in diameter were which lies in the soft tissues between the right hepatic lobe in the right kidney. 3. Mild dilation of the IVC which suggests increased right heart pressures. 4. Given the increased abdominal gas and the limitations on this study, abdominal and pelvic CT scanning would be useful. Electronically Signed   By: David  Martinique M.D.   On: 02/08/2015 14:38    Assessment & Plan:   Diagnoses and all orders for this visit:  Essential hypertension  B12 deficiency  Hyponatremia  Chronic lymphocytic leukemia ([place])  Cystitis   I am having Ms. Pontarelli maintain her Insulin Syringe-Needle U-100, aspirin, acetaminophen, Cholecalciferol, latanoprost, B-D INS SYR ULTRAFINE 123XX123, folic acid, B-D 3CC LUER-LOK SYR 25GX1", amLODipine, potassium chloride, simvastatin, gabapentin, hydrochlorothiazide, cyanocobalamin, loratadine, pantoprazole, and omeprazole.  No  orders of the defined types were placed in this encounter.     Follow-up: No Follow-up on file.  Walker Kehr, MD

## 2015-12-03 NOTE — Assessment & Plan Note (Signed)
On Amlodipne, HCTZ

## 2015-12-07 ENCOUNTER — Other Ambulatory Visit: Payer: Medicare Other

## 2015-12-09 ENCOUNTER — Telehealth: Payer: Self-pay | Admitting: Internal Medicine

## 2015-12-09 NOTE — Telephone Encounter (Signed)
[facility] Client [facility] Patient Name: [name] DOB: 1926/01/27 Initial Comment Possible yeast infection, vaginal pain Nurse Assessment Nurse: Martyn Ehrich RN, Felicia Date/Time (Eastern Time): 12/09/2015 4:47:22 PM Confirm and document reason for call. If symptomatic, describe symptoms. You must click the next button to save text entered. ---PT saw Md above on Friday and they thought she had UTI bc frequency and pain and he gave her nitrofurantoin to last thru today. She is still having the pain where she is having urine come out . She uses incontinence pads and she has leukemia. Not on chemo- she is on steroids she thinks once a month. SHe is not sure on fever or not - thermometer is not functioning. Has the patient traveled out of the country within the last 30 days? ---No Does the patient have any new or worsening symptoms? ---Yes Will a triage be completed? ---Yes Related visit to physician within the last 2 weeks? ---Yes Does the PT have any chronic conditions? (i.e. diabetes, asthma, etc.) ---YesList chronic conditions. ---leukemia Is this a behavioral health or substance abuse call? ---No Guidelines Guideline Title Affirmed Question Affirmed Notes Urinary Tract Infection on Antibiotic Follow-up Call - Female [1] Taking antibiotic > 72 hours (3 days) for UTI AND [2] painful urination or frequency not improved Final Disposition User See Physician within 24 Hours Remlap, RN, [place] Comments urine test was NL last friday- symtpoms are not worse but not better blood test was NL told her get a new thermometer and call if she has a fever Referrals REFERRED TO PCP OFFICE Disagree/Comply: Comply Call Id: [id number]

## 2015-12-10 ENCOUNTER — Ambulatory Visit (INDEPENDENT_AMBULATORY_CARE_PROVIDER_SITE_OTHER): Payer: Medicare Other | Admitting: Family

## 2015-12-10 ENCOUNTER — Encounter: Payer: Self-pay | Admitting: Family

## 2015-12-10 ENCOUNTER — Other Ambulatory Visit: Payer: Medicare Other

## 2015-12-10 VITALS — BP 130/80 | HR 85 | Temp 97.8°F | Resp 16 | Ht 62.5 in | Wt 143.0 lb

## 2015-12-10 DIAGNOSIS — R102 Pelvic and perineal pain: Secondary | ICD-10-CM | POA: Diagnosis not present

## 2015-12-10 DIAGNOSIS — R300 Dysuria: Secondary | ICD-10-CM | POA: Diagnosis not present

## 2015-12-10 LAB — POCT URINALYSIS DIPSTICK
Bilirubin, UA: NEGATIVE
Blood, UA: NEGATIVE
GLUCOSE UA: NEGATIVE
KETONES UA: NEGATIVE
Leukocytes, UA: NEGATIVE
NITRITE UA: NEGATIVE
PROTEIN UA: NEGATIVE
Spec Grav, UA: 1.025
UROBILINOGEN UA: NEGATIVE
pH, UA: 6

## 2015-12-10 MED ORDER — FLUCONAZOLE 150 MG PO TABS: 150.0000 mg | ORAL_TABLET | Freq: Every day | ORAL | Status: DC

## 2015-12-10 NOTE — Assessment & Plan Note (Signed)
Vaginal pain of undetermined origin with in office urinalysis being negative for leukocytes, nitrites, and hematuria. Patient expresses some relief with miconazole use over-the-counter. Will treat with fluconazole and refer to gynecology for further assessment and evaluation.

## 2015-12-10 NOTE — Telephone Encounter (Signed)
Please advise 

## 2015-12-10 NOTE — Telephone Encounter (Signed)
UA was normal Try OTC Cortaid (1% hydrocortisone cream) tid on the urethra opening Thx

## 2015-12-10 NOTE — Progress Notes (Signed)
Pre visit review using our clinic review tool, if applicable. No additional management support is needed unless otherwise documented below in the visit note. 

## 2015-12-10 NOTE — Patient Instructions (Addendum)
Thank you for choosing [facility].  Summary/Instructions:  Please take the fluconazole and repeat in 48 hours if no relief.   They will call to schedule an appointment with gynecology.  Your prescription(s) have been submitted to your pharmacy or been printed and provided for you. Please take as directed and contact our office if you believe you are having problem(s) with the medication(s) or have any questions.  If your symptoms worsen or fail to improve, please contact our office for further instruction, or in case of emergency go directly to the emergency room at the closest medical facility.

## 2015-12-10 NOTE — Progress Notes (Signed)
Subjective:    Patient ID: [name], female    DOB: 10/04/1925, 82 y.o.   MRN: [id number]  Chief Complaint  Patient presents with  . Follow-up    still having pain in her vaginal area and was told she does not have a UTI    HPI:  [name] is a 82 y.o. female who  has a past medical history of Hyperlipidemia; Hypertension; Allergy; colonic polyps; Diverticulosis of colon; Osteoarthritis; Pernicious anemia; Glaucoma; Peripheral neuropathy ([place]); Vitamin B12 deficiency; Chronic ethmoidal sinusitis; UTI (lower urinary tract infection) (2010); Leukemia ([place]); and Hypogammaglobulinemia ([place]). and presents today for a follow up office visit.  Recently evaluated in the office with chief complaint of urinary odor. Urinalysis at the time was negative for urinary tract infection. She was treated prophylactically with Macrobid. Reports taking the medication as prescribed and denies adverse side effects. Notes that her symptoms have not improved very much and continues to experience the associated symptom pain that is described as constant. Denies fevers, abdominal pain or discharge. Other modifying factors include miconazole which does help a little.  . Allergies  Allergen Reactions  . Pneumococcal Vaccines Swelling and Rash    Pneumococcal Vaccine-23 valent  . Clarithromycin Other (See Comments)    REACTION: sore mouth  . Hctz [Hydrochlorothiazide]     Low Na  . Oxycodone-Aspirin Other (See Comments)    REACTION: horrible nightmares  . Propoxyphene N-Acetaminophen Other (See Comments)    REACTION: couldn't wake her up  . Simvastatin     nausea     Current Outpatient Prescriptions on File Prior to Visit  Medication Sig Dispense Refill  . acetaminophen (TYLENOL) 500 MG tablet Take 500 mg by mouth 2 (two) times daily.     Marland Kitchen amLODipine (NORVASC) 5 MG tablet TAKE 1 TABLET (5 MG TOTAL) BY MOUTH DAILY. 30 tablet 11  . aspirin 81 MG EC tablet Take 81 mg by mouth daily.      . B-D 3CC  LUER-LOK SYR 25GX1" 25G X 1" 3 ML MISC USE TO ADMINISTER B12 INJECTIONS 10 each 2  . B-D INS SYR ULTRAFINE 1CC/30G 30G X 1/2" 1 ML MISC USE TO ADMINISTER B12 INJECTIONS 10 each 1  . Cholecalciferol (EQL VITAMIN D3) 1000 UNITS tablet Take 1,000 Units by mouth daily.      . cyanocobalamin (,VITAMIN B-12,) 1000 MCG/ML injection ADMINISTER 1 CC SUBQ EVERY 4 WEEKS AS DIRECTED 10 mL 1  . folic acid (FOLVITE) 1 MG tablet Take 1 mg by mouth daily. For restless legs    . gabapentin (NEURONTIN) 300 MG capsule TAKE ONE CAPSULE BY MOUTH EVERY DAY 90 capsule 2  . hydrochlorothiazide (MICROZIDE) 12.5 MG capsule TAKE ONE CAPSULE BY MOUTH EVERY DAY FOR BLOOD PRESSURE 90 capsule 2  . Insulin Syringe-Needle U-100 (B-D INS SYRINGE 0.5CC/30GX1/2") 30G X 1/2" 0.5 ML MISC by Does not apply route. To administer B12 injections     . latanoprost (XALATAN) 0.005 % ophthalmic solution Place 1 drop into both eyes at bedtime.     Marland Kitchen loratadine (CLARITIN) 10 MG tablet Take 1 tablet (10 mg total) by mouth daily. 90 tablet 3  . nitrofurantoin, macrocrystal-monohydrate, (MACROBID) 100 MG capsule Take 1 capsule (100 mg total) by mouth 2 (two) times daily. 14 capsule 0  . omeprazole (PRILOSEC) 40 MG capsule Take 40 mg by mouth daily.  3  . pantoprazole (PROTONIX) 40 MG tablet Take 1 tablet (40 mg total) by mouth daily. 90 tablet 3  .  potassium chloride (KLOR-CON M10) 10 MEQ tablet Take 1 tablet (10 mEq total) by mouth daily. 90 tablet 3  . simvastatin (ZOCOR) 20 MG tablet Take 10 mg by mouth daily. Reported on 08/05/2015     No current facility-administered medications on file prior to visit.     Review of Systems  Constitutional: Negative for fever and chills.  Genitourinary: Positive for dysuria and vaginal pain. Negative for urgency, frequency, hematuria, vaginal bleeding and vaginal discharge.      Objective:    BP 130/80 mmHg  Pulse 85  Temp(Src) 97.8 F (36.6 C) (Oral)  Resp 16  Ht 5' 2.5" (1.588 m)  Wt 143 lb  (64.864 kg)  BMI 25.72 kg/m2  SpO2 97% Nursing note and vital signs reviewed.  Physical Exam  Constitutional: She is oriented to person, place, and time. She appears well-developed and well-nourished. No distress.  Cardiovascular: Normal rate, regular rhythm, normal heart sounds and intact distal pulses.   Pulmonary/Chest: Effort normal and breath sounds normal.  Neurological: She is alert and oriented to person, place, and time.  Skin: Skin is warm and dry.  Psychiatric: She has a normal mood and affect. Her behavior is normal. Judgment and thought content normal.       Assessment & Plan:   Problem List Items Addressed This Visit      Other   Vaginal pain - Primary    Vaginal pain of undetermined origin with in office urinalysis being negative for leukocytes, nitrites, and hematuria. Patient expresses some relief with miconazole use over-the-counter. Will treat with fluconazole and refer to gynecology for further assessment and evaluation.      Relevant Medications   fluconazole (DIFLUCAN) 150 MG tablet   Other Relevant Orders   POCT urinalysis dipstick (Completed)   Urine culture   Ambulatory referral to Gynecology    Other Visit Diagnoses    Dysuria            I am having Ms. Linz start on fluconazole. I am also having her maintain her Insulin Syringe-Needle U-100, aspirin, acetaminophen, Cholecalciferol, latanoprost, B-D INS SYR ULTRAFINE 123XX123, folic acid, B-D 3CC LUER-LOK SYR 25GX1", amLODipine, potassium chloride, simvastatin, gabapentin, hydrochlorothiazide, cyanocobalamin, loratadine, pantoprazole, omeprazole, and nitrofurantoin (macrocrystal-monohydrate).   Meds ordered this encounter  Medications  . fluconazole (DIFLUCAN) 150 MG tablet    Sig: Take 1 tablet (150 mg total) by mouth daily. Repeat in 48 hours if needed.    Dispense:  2 tablet    Refill:  0    Order Specific Question:  Supervising Provider    Answer:  Pricilla Holm A [id number]      Follow-up: Return if symptoms worsen or fail to improve.  Mauricio Po, FNP

## 2015-12-13 LAB — URINE CULTURE

## 2015-12-14 ENCOUNTER — Ambulatory Visit (HOSPITAL_BASED_OUTPATIENT_CLINIC_OR_DEPARTMENT_OTHER): Payer: Medicare Other

## 2015-12-14 ENCOUNTER — Other Ambulatory Visit: Payer: Medicare Other

## 2015-12-14 VITALS — BP 199/55 | HR 61 | Temp 98.1°F | Resp 18

## 2015-12-14 DIAGNOSIS — C9110 Chronic lymphocytic leukemia of B-cell type not having achieved remission: Secondary | ICD-10-CM | POA: Diagnosis not present

## 2015-12-14 MED ORDER — SODIUM CHLORIDE 0.9 % IV SOLN
Freq: Once | INTRAVENOUS | Status: AC
  Administered 2015-12-14: 10:00:00 via INTRAVENOUS

## 2015-12-14 MED ORDER — DIPHENHYDRAMINE HCL 25 MG PO CAPS
25.0000 mg | ORAL_CAPSULE | Freq: Once | ORAL | Status: AC
  Administered 2015-12-14: 25 mg via ORAL

## 2015-12-14 MED ORDER — DIPHENHYDRAMINE HCL 25 MG PO CAPS
ORAL_CAPSULE | ORAL | Status: AC
  Filled 2015-12-14: qty 1

## 2015-12-14 MED ORDER — IMMUNE GLOBULIN (HUMAN) 10 GM/100ML IV SOLN
30.0000 g | Freq: Once | INTRAVENOUS | Status: AC
  Administered 2015-12-14: 30 g via INTRAVENOUS
  Filled 2015-12-14: qty 200

## 2015-12-14 NOTE — Progress Notes (Signed)
Patient went to her GP on May 12th with signs and symptoms of a UTI which was treated. Patient returned to GP on May19th for a recheck. Patient states that her energy was very low at the time and is gradually improving.

## 2015-12-14 NOTE — Patient Instructions (Signed)

## 2015-12-15 ENCOUNTER — Telehealth: Payer: Self-pay | Admitting: Obstetrics and Gynecology

## 2015-12-15 NOTE — Telephone Encounter (Signed)
Called and left a message for patient to call back to schedule a new patient doctor referral. She picked up the line and requested I call her back next week to get her scheduled.

## 2015-12-21 ENCOUNTER — Telehealth: Payer: Self-pay | Admitting: Internal Medicine

## 2015-12-21 DIAGNOSIS — R102 Pelvic and perineal pain: Secondary | ICD-10-CM

## 2015-12-21 MED ORDER — FLUCONAZOLE 150 MG PO TABS: 150.0000 mg | ORAL_TABLET | Freq: Every day | ORAL | Status: DC

## 2015-12-21 NOTE — Telephone Encounter (Signed)
Pt called in and said that she has a yeast infection for the med that she took for her uit.  She used something over the counter to try to fix it but it hasnt.  Can something be called to help her with this?    Pharmacy [facility] [address]

## 2015-12-21 NOTE — Telephone Encounter (Signed)
Ok Diflucan Thx 

## 2015-12-22 NOTE — Telephone Encounter (Signed)
Called pt [facility] md [facility].../[facility]mb

## 2015-12-29 ENCOUNTER — Ambulatory Visit (INDEPENDENT_AMBULATORY_CARE_PROVIDER_SITE_OTHER): Payer: Medicare Other | Admitting: Obstetrics and Gynecology

## 2015-12-29 ENCOUNTER — Encounter: Payer: Self-pay | Admitting: Obstetrics and Gynecology

## 2015-12-29 VITALS — BP 112/70 | HR 84 | Resp 13 | Ht 62.5 in | Wt 144.0 lb

## 2015-12-29 DIAGNOSIS — N952 Postmenopausal atrophic vaginitis: Secondary | ICD-10-CM | POA: Diagnosis not present

## 2015-12-29 DIAGNOSIS — R350 Frequency of micturition: Secondary | ICD-10-CM

## 2015-12-29 DIAGNOSIS — R102 Pelvic and perineal pain: Secondary | ICD-10-CM | POA: Diagnosis not present

## 2015-12-29 LAB — POCT URINALYSIS DIPSTICK
BILIRUBIN UA: NEGATIVE
Glucose, UA: NEGATIVE
KETONES UA: NEGATIVE
LEUKOCYTES UA: NEGATIVE
Nitrite, UA: NEGATIVE
PH UA: 6.5
PROTEIN UA: NEGATIVE
RBC UA: NEGATIVE
Urobilinogen, UA: NEGATIVE

## 2015-12-29 MED ORDER — ESTROGENS, CONJUGATED 0.625 MG/GM VA CREA: TOPICAL_CREAM | VAGINAL | Status: DC

## 2015-12-29 NOTE — Progress Notes (Signed)
Patient ID: [name], female   DOB: Jul 07, 1926, 83 y.o.   MRN: [id number] 82 y.o. G4P4 MarriedCaucasianF here c/o vaginal pain and pressure. She had  recently been treated for a UTI and yeast infection.  Treated for a UTI on 5/9, then treated for yeast 5/16 and 5/23. She has been using OTC antifungals.  She c/o pressure near the opening of her vagina. Myconazole helps some.  She has a h/o a cystocele, currently denies a vaginal bulge. She does c/o incontinence random. She c/o frequency of urination. Slight dysuria. No fevers. Feels week. Mild discomfort in her lower abdomen.  She has leukemia, she gets infusions 1 x a month. She worries about infection.     No LMP recorded. Patient is postmenopausal.          Sexually active: No.  The current method of family planning is abstinence.    Exercising: Yes.    walking Smoker:  no  Health Maintenance: Pap:  Years ago History of abnormal Pap:  no MMG:  7 years ago WNL per patient  Colonoscopy: 2008 WNL per patient  BMD:   Years ago TDaP:  2011 Gardasil: N/A   reports that she has never smoked. She has never used smokeless tobacco. She reports that she does not drink alcohol or use illicit drugs.  Past Medical History  Diagnosis Date  . Hyperlipidemia   . Hypertension   . Allergy     rhinitis  . Hx of colonic polyps   . Diverticulosis of colon   . Osteoarthritis   . Pernicious anemia   . Glaucoma   . Peripheral neuropathy ([place])   . Vitamin B12 deficiency   . Chronic ethmoidal sinusitis   . UTI (lower urinary tract infection) 2010  . Leukemia ([place])     Chronic lymphocytic leukemia  . Hypogammaglobulinemia ([place])     Monthly IVIG    Past Surgical History  Procedure Laterality Date  . Bilateral cataracts  2007  . Abdominal hysterectomy  1987  . Bladder tack  1987  . Ureter reconstruction  1992  . L4-l5 laminetomy  1999  . Tubaligation  1961  . Tubal ligation      Current Outpatient Prescriptions  Medication Sig  Dispense Refill  . acetaminophen (TYLENOL) 500 MG tablet Take 500 mg by mouth 2 (two) times daily.     Marland Kitchen amLODipine (NORVASC) 5 MG tablet TAKE 1 TABLET (5 MG TOTAL) BY MOUTH DAILY. 30 tablet 11  . aspirin 81 MG EC tablet Take 81 mg by mouth daily.      . B-D 3CC LUER-LOK SYR 25GX1" 25G X 1" 3 ML MISC USE TO ADMINISTER B12 INJECTIONS 10 each 2  . B-D INS SYR ULTRAFINE 1CC/30G 30G X 1/2" 1 ML MISC USE TO ADMINISTER B12 INJECTIONS 10 each 1  . Cholecalciferol (EQL VITAMIN D3) 1000 UNITS tablet Take 1,000 Units by mouth daily.      . cyanocobalamin (,VITAMIN B-12,) 1000 MCG/ML injection ADMINISTER 1 CC SUBQ EVERY 4 WEEKS AS DIRECTED 10 mL 1  . folic acid (FOLVITE) 1 MG tablet Take 1 mg by mouth daily. For restless legs    . gabapentin (NEURONTIN) 300 MG capsule TAKE ONE CAPSULE BY MOUTH EVERY DAY 90 capsule 2  . hydrochlorothiazide (MICROZIDE) 12.5 MG capsule TAKE ONE CAPSULE BY MOUTH EVERY DAY FOR BLOOD PRESSURE 90 capsule 2  . Insulin Syringe-Needle U-100 (B-D INS SYRINGE 0.5CC/30GX1/2") 30G X 1/2" 0.5 ML MISC by Does not apply route. To administer  B12 injections     . latanoprost (XALATAN) 0.005 % ophthalmic solution Place 1 drop into both eyes at bedtime.     Marland Kitchen loratadine (CLARITIN) 10 MG tablet Take 1 tablet (10 mg total) by mouth daily. 90 tablet 3  . omeprazole (PRILOSEC) 40 MG capsule Take 40 mg by mouth daily.  3  . pantoprazole (PROTONIX) 40 MG tablet Take 1 tablet (40 mg total) by mouth daily. 90 tablet 3  . potassium chloride (KLOR-CON M10) 10 MEQ tablet Take 1 tablet (10 mEq total) by mouth daily. 90 tablet 3  . simvastatin (ZOCOR) 20 MG tablet Take 10 mg by mouth daily. Reported on 08/05/2015     No current facility-administered medications for this visit.    Family History  Problem Relation Age of Onset  . Cancer Mother     uterine  . Diabetes Mother   . Jaundice Father   . Other Sister     brain tumor  . Cancer Sister   . Stroke Son 75    in NH  . Hypertension Other   .  Cancer Other     breast  . Thyroid disease Sister   . Cancer Sister     thyroid cancer    Review of Systems  Constitutional: Negative.   HENT: Negative.   Eyes: Negative.   Respiratory: Negative.   Cardiovascular: Negative.   Gastrointestinal: Negative.   Endocrine: Negative.   Genitourinary: Positive for vaginal pain.  Musculoskeletal: Negative.   Skin: Negative.   Allergic/Immunologic: Negative.   Neurological: Negative.   Psychiatric/Behavioral: Negative.     Exam:   BP 112/70 mmHg  Pulse 84  Resp 13  Ht 5' 2.5" (1.588 m)  Wt 144 lb (65.318 kg)  BMI 25.90 kg/m2  Weight change: @WEIGHTCHANGE @ Height:   Height: 5' 2.5" (158.8 cm)  Ht Readings from Last 3 Encounters:  12/29/15 5' 2.5" (1.588 m)  12/10/15 5' 2.5" (1.588 m)  10/19/15 5' 2.5" (1.588 m)    General appearance: alert, cooperative and appears stated age Abdomen: soft, very mild sp tenderness, no rebound, no guarding, no masses,  no organomegaly    Pelvic: External genitalia:  no lesions, atrophic              Urethra:  normal appearing urethra with no masses, tenderness or lesions              Bartholins and Skenes: normal                 Vagina: very atrophic appearing vagina, no discharge, no lesions. Grade 1 cystocele and rectocele. Examined supine with and without valsalva.               Cervix: absent               Bimanual Exam:  Uterus:  uterus absent              Adnexa: no mass, fullness, tenderness               Rectovaginal: Confirms               Anus:  normal sphincter tone, no lesions  Chaperone was present for exam.  A:  Vaginal discomfort  Atrophic vaginitis  Urinary frequnency  P:   Wet prep probe  Urine for ua, c&s   Try premarin vaginal cream  Also discussed replense  F/U in 2 weeks

## 2015-12-29 NOTE — Patient Instructions (Addendum)
Atrophic Vaginitis Atrophic vaginitis is a condition in which the tissues that line the vagina become dry and thin. This condition is most common in women who have stopped having regular menstrual periods (menopause). This usually starts when a woman is 45-80 years old. Estrogen helps to keep the vagina moist. It stimulates the vagina to produce a clear fluid that lubricates the vagina for sexual intercourse. This fluid also protects the vagina from infection. Lack of estrogen can cause the lining of the vagina to get thinner and dryer. The vagina may also shrink in size. It may become less elastic. Atrophic vaginitis tends to get worse over time as a woman's estrogen level drops. CAUSES This condition is caused by the normal drop in estrogen that happens around the time of menopause. RISK FACTORS Certain conditions or situations may lower a woman's estrogen level, which increases her risk of atrophic vaginitis. These include:  Taking medicine that blocks estrogen.  Having ovaries removed surgically.  Being treated for cancer with X-ray treatment (radiation) or medicines (chemotherapy).  Exercising very hard and often.  Having an eating disorder (anorexia).  Giving birth or breastfeeding.  Being over the age of 50.  Smoking. SYMPTOMS Symptoms of this condition include:  Pain, soreness, or bleeding during sexual intercourse (dyspareunia).  Vaginal burning, irritation, or itching.  Pain or bleeding during a vaginal examination using a speculum (pelvic exam).  Loss of interest in sexual activity.  Having burning pain when passing urine.  Vaginal discharge that is brown or yellow. In some cases, there are no symptoms. DIAGNOSIS This condition is diagnosed with a medical history and physical exam. This will include a pelvic exam that checks whether the inside of your vagina appears pale, thin, or dry. Rarely, you may also have other tests, including:  A urine test.  A test that  checks the acid balance in your vaginal fluid (acid balance test). TREATMENT Treatment for this condition may depend on the severity of your symptoms. Treatment may include:  Using an over-the-counter vaginal lubricant before you have sexual intercourse.  Using a long-acting vaginal moisturizer.  Using low-dose vaginal estrogen for moderate to severe symptoms that do not respond to other treatments. Options include creams, tablets, and inserts (vaginal rings). Before using vaginal estrogen, tell your health care provider if you have a history of:  Breast cancer.  Endometrial cancer.  Blood clots.  Taking medicines. You may be able to take a daily pill for dyspareunia. Discuss all of the risks of this medicine with your health care provider. It is usually not recommended for women who have a family history or personal history of breast cancer. If your symptoms are very mild and you are not sexually active, you may not need treatment. HOME CARE INSTRUCTIONS  Take medicines only as directed by your health care provider. Do not use herbal or alternative medicines unless your health care provider says that you can.  Use over-the-counter creams, lubricants, or moisturizers for dryness only as directed by your health care provider.  If your atrophic vaginitis is caused by menopause, discuss all of your menopausal symptoms and treatment options with your health care provider.  Do not douche.  Do not use products that can make your vagina dry. These include:  Scented feminine sprays.  Scented tampons.  Scented soaps.  If it hurts to have sex, talk with your sexual partner. SEEK MEDICAL CARE IF:  Your discharge looks different than normal.  Your vagina has an unusual smell.  You have   new symptoms.  Your symptoms do not improve with treatment.  Your symptoms get worse.   This information is not intended to replace advice given to you by your health care provider. Make sure you  discuss any questions you have with your health care provider.   Document Released: 11/24/2014 Document Reviewed: 11/24/2014 Elsevier Interactive Patient Education 2016 [employer]   Try the premarin vaginal cream 0.5 grams intravaginally every night for 1 week, then 2 x a week at bedtime  Other things you can try:  You can also try Replens vaginal lubricant (use as per directions)  You can use Vaseline externally or coconut oil externally or internally to moisturize as needed.

## 2015-12-30 ENCOUNTER — Telehealth: Payer: Self-pay | Admitting: *Deleted

## 2015-12-30 LAB — URINALYSIS, MICROSCOPIC ONLY
Bacteria, UA: NONE SEEN [HPF]
CASTS: NONE SEEN [LPF]
RBC / HPF: NONE SEEN RBC/HPF (ref <=2)
YEAST: NONE SEEN [HPF]

## 2015-12-30 LAB — URINE CULTURE
COLONY COUNT: NO GROWTH
ORGANISM ID, BACTERIA: NO GROWTH

## 2015-12-30 LAB — WET PREP BY MOLECULAR PROBE
Candida species: NEGATIVE
Gardnerella vaginalis: NEGATIVE
TRICHOMONAS VAG: NEGATIVE

## 2015-12-30 NOTE — Telephone Encounter (Signed)
I spoke to and gave her lab results. I let her know we would contact her back in regards to her U/CShe did get the Premarin cream filled.  She does use the my chart sometimes.

## 2015-12-30 NOTE — Telephone Encounter (Signed)
-----   Message from Salvadore Dom, MD sent at 12/30/2015  9:49 AM EDT ----- Please let her know that her vaginitis panel is negative, urine culture is pending. See if she was able to get the premarin cream. Also ask if she checks her my chart messages, I see she is signed up. Thanks!

## 2015-12-30 NOTE — Telephone Encounter (Signed)
Patient is returning a call to [place]. Please call her at her home phone number.

## 2015-12-30 NOTE — Telephone Encounter (Signed)
Left message to call back -eh 

## 2016-01-04 ENCOUNTER — Other Ambulatory Visit: Payer: Medicare Other

## 2016-01-10 ENCOUNTER — Other Ambulatory Visit: Payer: Self-pay | Admitting: *Deleted

## 2016-01-10 ENCOUNTER — Other Ambulatory Visit: Payer: Self-pay | Admitting: Nurse Practitioner

## 2016-01-10 DIAGNOSIS — C9110 Chronic lymphocytic leukemia of B-cell type not having achieved remission: Secondary | ICD-10-CM

## 2016-01-11 ENCOUNTER — Other Ambulatory Visit: Payer: Medicare Other

## 2016-01-11 ENCOUNTER — Ambulatory Visit (HOSPITAL_BASED_OUTPATIENT_CLINIC_OR_DEPARTMENT_OTHER): Payer: Medicare Other

## 2016-01-11 VITALS — BP 132/66 | HR 65 | Temp 98.7°F | Resp 17

## 2016-01-11 DIAGNOSIS — C9110 Chronic lymphocytic leukemia of B-cell type not having achieved remission: Secondary | ICD-10-CM

## 2016-01-11 MED ORDER — DIPHENHYDRAMINE HCL 25 MG PO CAPS
25.0000 mg | ORAL_CAPSULE | Freq: Once | ORAL | Status: AC
  Administered 2016-01-11: 25 mg via ORAL

## 2016-01-11 MED ORDER — IMMUNE GLOBULIN (HUMAN) 10 GM/100ML IV SOLN
30.0000 g | Freq: Once | INTRAVENOUS | Status: AC
Start: 2016-01-11 — End: 2016-01-11
  Administered 2016-01-11: 30 g via INTRAVENOUS
  Filled 2016-01-11: qty 200

## 2016-01-11 MED ORDER — ACETAMINOPHEN 325 MG PO TABS
ORAL_TABLET | ORAL | Status: AC
  Filled 2016-01-11: qty 2

## 2016-01-11 MED ORDER — SODIUM CHLORIDE 0.9 % IV SOLN
INTRAVENOUS | Status: DC
  Administered 2016-01-11: 10:00:00 via INTRAVENOUS

## 2016-01-11 MED ORDER — DIPHENHYDRAMINE HCL 25 MG PO CAPS
ORAL_CAPSULE | ORAL | Status: AC
  Filled 2016-01-11: qty 1

## 2016-01-11 NOTE — Patient Instructions (Signed)

## 2016-01-12 ENCOUNTER — Encounter: Payer: Self-pay | Admitting: Obstetrics and Gynecology

## 2016-01-12 ENCOUNTER — Ambulatory Visit (INDEPENDENT_AMBULATORY_CARE_PROVIDER_SITE_OTHER): Payer: Medicare Other | Admitting: Obstetrics and Gynecology

## 2016-01-12 VITALS — BP 110/70 | HR 62 | Resp 16 | Ht 62.5 in | Wt 144.0 lb

## 2016-01-12 DIAGNOSIS — N898 Other specified noninflammatory disorders of vagina: Secondary | ICD-10-CM | POA: Diagnosis not present

## 2016-01-12 DIAGNOSIS — M5489 Other dorsalgia: Secondary | ICD-10-CM | POA: Diagnosis not present

## 2016-01-12 DIAGNOSIS — N952 Postmenopausal atrophic vaginitis: Secondary | ICD-10-CM

## 2016-01-12 NOTE — Progress Notes (Signed)
GYNECOLOGY  VISIT   HPI: 82 y.o.   Married  Caucasian  female   [id number] with No LMP recorded. Patient is postmenopausal.   here for  Follow up. She was seen 2 weeks ago for vaginal pain and some urinary symptoms. Negative vaginitis probe and urine culture. She did have atrophy and was started on premarin vaginal cream. She is slightly better, maybe 20%. She is using the premarin in the morning. She is aware of her vagina, just uncomfortable.  She has some left flank pain, used to just be in the morning, now all day long.  She is an Training and development officer, Education officer, museum, flowers. Sings in 2 choirs, teaches Sunday school. She is from [place].   GYNECOLOGIC HISTORY: No LMP recorded. Patient is postmenopausal. Contraception: Post menopausal  Menopausal hormone therapy: none        OB History    Gravida Para Term Preterm AB TAB SAB Ectopic Multiple Living   4 4        4          Patient Active Problem List   Diagnosis Date Noted  . Cystocele 03/05/2015  . Nausea without vomiting 02/15/2015  . Pain in throat 02/08/2015  . Dehydration 02/08/2015  . Hyponatremia 02/08/2015  . Vaginal pain 01/27/2015  . Well adult exam 10/01/2014  . Grief at loss of child 12/11/2013  . Irregular heart beat 09/29/2013  . Humeral head fracture 08/26/2013  . Actinic keratoses 08/26/2013  . Carotid bruit 05/02/2012  . Anxiety 03/17/2011  . Halitosis 12/14/2010  . HYPOKALEMIA 05/18/2008  . Cystitis 04/21/2008  . PLANTAR FASCIITIS, RIGHT 10/10/2007  . ANEMIA, PERNICIOUS, HX OF 10/10/2007  . PERIPHERAL NEUROPATHY 09/04/2007  . SINUSITIS, ETHMOIDAL 09/04/2007  . Chronic fatigue 08/06/2007  . Headache(784.0) 08/06/2007  . SINUSITIS- ACUTE-NOS 07/29/2007  . Chronic lymphocytic leukemia ([place]) 06/17/2007  . ALLERGIC RHINITIS 06/17/2007  . OSTEOARTHRITIS 06/17/2007  . B12 deficiency 06/07/2007  . GASTROESOPHAGEAL REFLUX DISEASE 04/30/2007  . GASTRITIS 04/30/2007  . Dyslipidemia 12/19/2006  . Essential hypertension 12/19/2006   . DIVERTICULOSIS, COLON 05/19/2003  . COLONIC POLYPS, HX OF 01/10/2000    Past Medical History  Diagnosis Date  . Hyperlipidemia   . Hypertension   . Allergy     rhinitis  . Hx of colonic polyps   . Diverticulosis of colon   . Osteoarthritis   . Pernicious anemia   . Glaucoma   . Peripheral neuropathy ([place])   . Vitamin B12 deficiency   . Chronic ethmoidal sinusitis   . UTI (lower urinary tract infection) 2010  . Leukemia ([place])     Chronic lymphocytic leukemia  . Hypogammaglobulinemia ([place])     Monthly IVIG    Past Surgical History  Procedure Laterality Date  . Bilateral cataracts  2007  . Abdominal hysterectomy  1987  . Bladder tack  1987  . Ureter reconstruction  1992  . L4-l5 laminetomy  1999  . Tubaligation  1961  . Tubal ligation      Current Outpatient Prescriptions  Medication Sig Dispense Refill  . acetaminophen (TYLENOL) 500 MG tablet Take 500 mg by mouth 2 (two) times daily.     Marland Kitchen amLODipine (NORVASC) 5 MG tablet TAKE 1 TABLET (5 MG TOTAL) BY MOUTH DAILY. 30 tablet 11  . aspirin 81 MG EC tablet Take 81 mg by mouth daily.      . B-D 3CC LUER-LOK SYR 25GX1" 25G X 1" 3 ML MISC USE TO ADMINISTER B12 INJECTIONS 10 each 2  . B-D INS  SYR ULTRAFINE 1CC/30G 30G X 1/2" 1 ML MISC USE TO ADMINISTER B12 INJECTIONS 10 each 1  . Cholecalciferol (EQL VITAMIN D3) 1000 UNITS tablet Take 1,000 Units by mouth daily.      Marland Kitchen conjugated estrogens (PREMARIN) vaginal cream 1/2 gram vaginally every night x 1 week, then twice weekly at bedtime 90 g 3  . cyanocobalamin (,VITAMIN B-12,) 1000 MCG/ML injection ADMINISTER 1 CC SUBQ EVERY 4 WEEKS AS DIRECTED 10 mL 1  . folic acid (FOLVITE) 1 MG tablet Take 1 mg by mouth daily. For restless legs    . gabapentin (NEURONTIN) 300 MG capsule TAKE ONE CAPSULE BY MOUTH EVERY DAY 90 capsule 2  . hydrochlorothiazide (MICROZIDE) 12.5 MG capsule TAKE ONE CAPSULE BY MOUTH EVERY DAY FOR BLOOD PRESSURE 90 capsule 2  . Insulin Syringe-Needle U-100 (B-D  INS SYRINGE 0.5CC/30GX1/2") 30G X 1/2" 0.5 ML MISC by Does not apply route. To administer B12 injections     . latanoprost (XALATAN) 0.005 % ophthalmic solution Place 1 drop into both eyes at bedtime.     Marland Kitchen loratadine (CLARITIN) 10 MG tablet Take 1 tablet (10 mg total) by mouth daily. 90 tablet 3  . omeprazole (PRILOSEC) 40 MG capsule Take 40 mg by mouth daily.  3  . pantoprazole (PROTONIX) 40 MG tablet Take 1 tablet (40 mg total) by mouth daily. 90 tablet 3  . potassium chloride (KLOR-CON M10) 10 MEQ tablet Take 1 tablet (10 mEq total) by mouth daily. 90 tablet 3  . simvastatin (ZOCOR) 20 MG tablet Take 10 mg by mouth daily. Reported on 08/05/2015     No current facility-administered medications for this visit.     ALLERGIES: Pneumococcal vaccines; Clarithromycin; Hctz; Oxycodone-aspirin; Propoxyphene n-acetaminophen; and Simvastatin  Family History  Problem Relation Age of Onset  . Cancer Mother     uterine  . Diabetes Mother   . Jaundice Father   . Other Sister     brain tumor  . Cancer Sister   . Stroke Son 54    in NH  . Hypertension Other   . Cancer Other     breast  . Thyroid disease Sister   . Cancer Sister     thyroid cancer    Social History   Social History  . Marital Status: Married    Spouse Name: N/A  . Number of Children: N/A  . Years of Education: N/A   Occupational History  . Not on file.   Social History Main Topics  . Smoking status: Never Smoker   . Smokeless tobacco: Never Used  . Alcohol Use: No  . Drug Use: No  . Sexual Activity: No   Other Topics Concern  . Not on file   Social History Narrative    Review of Systems  Constitutional: Negative.   HENT: Negative.   Eyes: Negative.   Respiratory: Negative.   Cardiovascular: Negative.   Gastrointestinal: Negative.   Genitourinary: Positive for flank pain.  Musculoskeletal: Negative.   Skin: Negative.   Neurological: Negative.   Endo/Heme/Allergies: Negative.    Psychiatric/Behavioral: Negative.     PHYSICAL EXAMINATION:    BP 110/70 mmHg  Pulse 62  Resp 16  Ht 5' 2.5" (1.588 m)  Wt 144 lb (65.318 kg)  BMI 25.90 kg/m2    General appearance: alert, cooperative and appears stated age CVA: not tender Pelvic: External genitalia:  no lesions              Urethra:  normal appearing urethra with no  masses, tenderness or lesions              Bartholins and Skenes: normal                 Vagina: normal appearing vagina with mild atrophy, improving. Grade 1 cystocele and rectocele              Cervix: absent              Bimanual Exam:  Uterus:  uterus absent              Adnexa: no mass, fullness, tenderness               Chaperone was present for exam.  ASSESSMENT Vaginal irritation, slightly improved Vaginal atrophy improving with premarin Back pain   PLAN Change to taking the premarin at night, 2 x a week Call if not continuing to improve in the next month, otherwise f/u in 2 months F/U with her primary for her back pain   An After Visit Summary was printed and given to the patient.  15 minutes face to face time of which over 50% was spent in counseling.

## 2016-01-22 ENCOUNTER — Telehealth: Payer: Self-pay

## 2016-01-22 NOTE — Telephone Encounter (Signed)
Patient is on the list for [employer] 2017 and may be a good candidate for an AWV in 2017. Please let me know if/when appt is scheduled.   

## 2016-01-31 ENCOUNTER — Other Ambulatory Visit: Payer: Self-pay | Admitting: Internal Medicine

## 2016-02-01 ENCOUNTER — Ambulatory Visit: Payer: Medicare Other | Admitting: Oncology

## 2016-02-01 ENCOUNTER — Other Ambulatory Visit: Payer: Medicare Other

## 2016-02-08 ENCOUNTER — Ambulatory Visit (HOSPITAL_BASED_OUTPATIENT_CLINIC_OR_DEPARTMENT_OTHER): Payer: Medicare Other | Admitting: Oncology

## 2016-02-08 ENCOUNTER — Ambulatory Visit (HOSPITAL_BASED_OUTPATIENT_CLINIC_OR_DEPARTMENT_OTHER): Payer: Medicare Other

## 2016-02-08 ENCOUNTER — Other Ambulatory Visit (HOSPITAL_BASED_OUTPATIENT_CLINIC_OR_DEPARTMENT_OTHER): Payer: Medicare Other

## 2016-02-08 ENCOUNTER — Telehealth: Payer: Self-pay | Admitting: Oncology

## 2016-02-08 VITALS — BP 121/57 | HR 67 | Temp 97.9°F | Resp 17 | Ht 62.5 in | Wt 143.3 lb

## 2016-02-08 VITALS — BP 135/74 | HR 64 | Temp 99.4°F | Resp 18

## 2016-02-08 DIAGNOSIS — C9110 Chronic lymphocytic leukemia of B-cell type not having achieved remission: Secondary | ICD-10-CM

## 2016-02-08 LAB — CBC WITH DIFFERENTIAL/PLATELET
BASO%: 0.4 % (ref 0.0–2.0)
BASOS ABS: 0 10*3/uL (ref 0.0–0.1)
EOS ABS: 0.1 10*3/uL (ref 0.0–0.5)
EOS%: 0.9 % (ref 0.0–7.0)
HCT: 41.6 % (ref 34.8–46.6)
HEMOGLOBIN: 14.4 g/dL (ref 11.6–15.9)
LYMPH%: 33.9 % (ref 14.0–49.7)
MCH: 31 pg (ref 25.1–34.0)
MCHC: 34.6 g/dL (ref 31.5–36.0)
MCV: 89.5 fL (ref 79.5–101.0)
MONO#: 0.5 10*3/uL (ref 0.1–0.9)
MONO%: 8.6 % (ref 0.0–14.0)
NEUT#: 3.2 10*3/uL (ref 1.5–6.5)
NEUT%: 56.2 % (ref 38.4–76.8)
Platelets: 135 10*3/uL — ABNORMAL LOW (ref 145–400)
RBC: 4.65 10*6/uL (ref 3.70–5.45)
RDW: 14.1 % (ref 11.2–14.5)
WBC: 5.7 10*3/uL (ref 3.9–10.3)
lymph#: 1.9 10*3/uL (ref 0.9–3.3)

## 2016-02-08 MED ORDER — DIPHENHYDRAMINE HCL 25 MG PO CAPS
ORAL_CAPSULE | ORAL | Status: AC
  Filled 2016-02-08: qty 2

## 2016-02-08 MED ORDER — DEXTROSE 5 % IV SOLN
INTRAVENOUS | Status: DC
  Administered 2016-02-08: 10:00:00 via INTRAVENOUS

## 2016-02-08 MED ORDER — DIPHENHYDRAMINE HCL 25 MG PO CAPS
25.0000 mg | ORAL_CAPSULE | Freq: Once | ORAL | Status: AC
  Administered 2016-02-08: 25 mg via ORAL

## 2016-02-08 MED ORDER — IMMUNE GLOBULIN (HUMAN) 10 GM/100ML IV SOLN
30.0000 g | Freq: Once | INTRAVENOUS | Status: AC
  Administered 2016-02-08: 30 g via INTRAVENOUS
  Filled 2016-02-08: qty 200

## 2016-02-08 NOTE — Progress Notes (Signed)
  [facility] OFFICE PROGRESS NOTE   Diagnosis: CLL  INTERVAL HISTORY:  [name] returns as scheduled. She reports being diagnosed with a urinary tract infection last month. She was treated with antibiotics and then developed a vaginal yeast infection. Her symptoms have resolved. She was evaluated by gynecology and prescribed Premarin for vaginal atrophy. She is no longer using the Premarin. She is concerned about the cancer risk.  Objective:  Vital signs in last 24 hours:  Blood pressure 121/57, pulse 67, temperature 97.9 F (36.6 C), temperature source Oral, resp. rate 17, height 5' 2.5" (1.588 m), weight 143 lb 4.8 oz (65 kg), SpO2 98 %.    HEENT: Neck without mass Lymphatics: No cervical or supraclavicular nodes Resp: Lungs clear bilaterally Cardio: Regular rate and rhythm GI: No hepatosplenomegaly, no mass, nontender Vascular: No leg edema   Lab Results:  Lab Results  Component Value Date   WBC 5.7 02/08/2016   HGB 14.4 02/08/2016   HCT 41.6 02/08/2016   MCV 89.5 02/08/2016   PLT 135* 02/08/2016   NEUTROABS 3.2 02/08/2016     Medications: I have reviewed the patient's current medications.  Assessment/Plan: 1. Chronic lymphocytic leukemia, asymptomatic. 2. History of hypogammaglobulinemia with recurrent infections. She continues monthly IVIG replacement therapy. 3. History of recurrent urinary tract infections. Coagulase-negative Staphylococcus infection August 2016 4. History of mild thrombocytopenia secondary to CLL.   Disposition:  [name] appears well. She feels the IVIG has decreased the severity of infections. The plan is to continue monthly IVIG. We will check immunoglobulin levels when she returns for an office visit in 4 months.  Betsy Coder, MD  02/08/2016  9:36 AM

## 2016-02-08 NOTE — Telephone Encounter (Signed)
Gave pt cal & avs °

## 2016-02-08 NOTE — Patient Instructions (Signed)

## 2016-02-09 LAB — IGG, IGA, IGM
IGA/IMMUNOGLOBULIN A, SERUM: 239 mg/dL (ref 64–422)
IgG, Qn, Serum: 1165 mg/dL (ref 700–1600)
IgM, Qn, Serum: 47 mg/dL (ref 26–217)

## 2016-02-24 ENCOUNTER — Other Ambulatory Visit: Payer: Self-pay | Admitting: Internal Medicine

## 2016-03-03 ENCOUNTER — Other Ambulatory Visit: Payer: Self-pay | Admitting: Internal Medicine

## 2016-03-03 ENCOUNTER — Ambulatory Visit (INDEPENDENT_AMBULATORY_CARE_PROVIDER_SITE_OTHER): Payer: Medicare Other | Admitting: Internal Medicine

## 2016-03-03 ENCOUNTER — Telehealth: Payer: Self-pay | Admitting: Internal Medicine

## 2016-03-03 ENCOUNTER — Other Ambulatory Visit (INDEPENDENT_AMBULATORY_CARE_PROVIDER_SITE_OTHER): Payer: Medicare Other

## 2016-03-03 ENCOUNTER — Encounter: Payer: Self-pay | Admitting: Internal Medicine

## 2016-03-03 VITALS — BP 120/66 | HR 92 | Wt 143.0 lb

## 2016-03-03 DIAGNOSIS — F0781 Postconcussional syndrome: Secondary | ICD-10-CM

## 2016-03-03 DIAGNOSIS — C9110 Chronic lymphocytic leukemia of B-cell type not having achieved remission: Secondary | ICD-10-CM

## 2016-03-03 DIAGNOSIS — W19XXXA Unspecified fall, initial encounter: Secondary | ICD-10-CM | POA: Insufficient documentation

## 2016-03-03 DIAGNOSIS — W1809XA Striking against other object with subsequent fall, initial encounter: Secondary | ICD-10-CM | POA: Diagnosis not present

## 2016-03-03 LAB — URINALYSIS, ROUTINE W REFLEX MICROSCOPIC
Bilirubin Urine: NEGATIVE
HGB URINE DIPSTICK: NEGATIVE
Ketones, ur: NEGATIVE
Nitrite: NEGATIVE
Specific Gravity, Urine: 1.015 (ref 1.000–1.030)
TOTAL PROTEIN, URINE-UPE24: NEGATIVE
URINE GLUCOSE: NEGATIVE
UROBILINOGEN UA: 0.2 (ref 0.0–1.0)
pH: 6 (ref 5.0–8.0)

## 2016-03-03 LAB — CBC WITH DIFFERENTIAL/PLATELET
BASOS ABS: 0 10*3/uL (ref 0.0–0.1)
Basophils Relative: 0.4 % (ref 0.0–3.0)
EOS ABS: 0.1 10*3/uL (ref 0.0–0.7)
Eosinophils Relative: 1.1 % (ref 0.0–5.0)
HEMATOCRIT: 44.6 % (ref 36.0–46.0)
HEMOGLOBIN: 14.9 g/dL (ref 12.0–15.0)
LYMPHS PCT: 29.6 % (ref 12.0–46.0)
Lymphs Abs: 2.1 10*3/uL (ref 0.7–4.0)
MCHC: 33.5 g/dL (ref 30.0–36.0)
MCV: 92.4 fl (ref 78.0–100.0)
Monocytes Absolute: 0.3 10*3/uL (ref 0.1–1.0)
Monocytes Relative: 4.8 % (ref 3.0–12.0)
Neutro Abs: 4.6 10*3/uL (ref 1.4–7.7)
Neutrophils Relative %: 64.1 % (ref 43.0–77.0)
PLATELETS: 165 10*3/uL (ref 150.0–400.0)
RBC: 4.82 Mil/uL (ref 3.87–5.11)
RDW: 14.3 % (ref 11.5–15.5)
WBC: 7.2 10*3/uL (ref 4.0–10.5)

## 2016-03-03 LAB — BASIC METABOLIC PANEL
BUN: 19 mg/dL (ref 6–23)
CHLORIDE: 97 meq/L (ref 96–112)
CO2: 32 mEq/L (ref 19–32)
Calcium: 10.3 mg/dL (ref 8.4–10.5)
Creatinine, Ser: 0.97 mg/dL (ref 0.40–1.20)
GFR: 57.66 mL/min — ABNORMAL LOW (ref >60.00)
GLUCOSE: 100 mg/dL — AB (ref 70–99)
POTASSIUM: 4.4 meq/L (ref 3.5–5.1)
SODIUM: 135 meq/L (ref 135–145)

## 2016-03-03 MED ORDER — CEFUROXIME AXETIL 250 MG PO TABS: 250.0000 mg | ORAL_TABLET | Freq: Two times a day (BID) | ORAL | 0 refills | Status: DC

## 2016-03-03 MED ORDER — LATANOPROST 0.005 % OP SOLN: 1.0000 [drp] | Freq: Every day | OPHTHALMIC | 5 refills | Status: DC

## 2016-03-03 NOTE — Patient Instructions (Signed)
Post-Concussion Syndrome  Post-concussion syndrome describes the symptoms that can occur after a head injury. These symptoms can last from weeks to months.  CAUSES   It is not clear why some head injuries cause post-concussion syndrome. It can occur whether your head injury was mild or severe and whether you were wearing head protection or not.   SIGNS AND SYMPTOMS  · Memory difficulties.  · Dizziness.  · Headaches.  · Double vision or blurry vision.  · Sensitivity to light.  · Hearing difficulties.  · Depression.  · Tiredness.  · Weakness.  · Difficulty with concentration.  · Difficulty sleeping or staying asleep.  · Vomiting.  · Poor balance or instability on your feet.  · Slow reaction time.  · Difficulty learning and remembering things you have heard.  DIAGNOSIS   There is no test to determine whether you have post-concussion syndrome. Your health care provider may order an imaging scan of your brain, such as a CT scan, to check for other problems that may be causing your symptoms (such as a severe injury inside your skull).  TREATMENT   Usually, these problems disappear over time without medical care. Your health care provider may prescribe medicine to help ease your symptoms. It is important to follow up with a neurologist to evaluate your recovery and address any lingering symptoms or issues.  HOME CARE INSTRUCTIONS   · Take medicines only as directed by your health care provider. Do not take aspirin. Aspirin can slow blood clotting.  · Sleep with your head slightly elevated to help with headaches.  · Avoid any situation where there is potential for another head injury. This includes football, hockey, soccer, basketball, martial arts, downhill snow sports, and horseback riding. Your condition will get worse every time you experience a concussion. You should avoid these activities until you are evaluated by the appropriate follow-up health care providers.  · Keep all follow-up visits as directed by your health  care provider. This is important.  SEEK MEDICAL CARE IF:  · You have increased problems paying attention or concentrating.  · You have increased difficulty remembering or learning new information.  · You need more time to complete tasks or assignments than before.  · You have increased irritability or decreased ability to cope with stress.  · You have more symptoms than before.  Seek medical care if you have any of the following symptoms for more than two weeks after your injury:  · Lasting (chronic) headaches.  · Dizziness or balance problems.  · Nausea.  · Vision problems.  · Increased sensitivity to noise or light.  · Depression or mood swings.  · Anxiety or irritability.  · Memory problems.  · Difficulty concentrating or paying attention.  · Sleep problems.  · Feeling tired all the time.  SEEK IMMEDIATE MEDICAL CARE IF:  · You have confusion or unusual drowsiness.  · Others find it difficult to wake you up.  · You have nausea or persistent, forceful vomiting.  · You feel like you are moving when you are not (vertigo). Your eyes may move rapidly back and forth.  · You have convulsions or faint.  · You have severe, persistent headaches that are not relieved by medicine.  · You cannot use your arms or legs normally.  · One of your pupils is larger than the other.  · You have clear or bloody discharge from your nose or ears.  · Your problems are getting worse, not better.  MAKE   SURE YOU:  · Understand these instructions.  · Will watch your condition.  · Will get help right away if you are not doing well or get worse.     This information is not intended to replace advice given to you by your health care provider. Make sure you discuss any questions you have with your health care provider.     Document Released: 12/30/2001 Document Revised: 07/31/2014 Document Reviewed: 10/15/2013  Elsevier Interactive Patient Education ©2016 [employer] Inc[employer]

## 2016-03-03 NOTE — Progress Notes (Signed)
Subjective:  Patient ID: [name], female    DOB: 1926/01/20  Age: 82 y.o. MRN: [id number]  CC: No chief complaint on file.   HPI [name] presents for HAs on the L, weakness, dizziness after a fall in the kitchen on 02/11/26 - she bruised her head against the refrigerator - L ear bled. ?no LOC.  Outpatient Medications Prior to Visit  Medication Sig Dispense Refill  . acetaminophen (TYLENOL) 500 MG tablet Take 500 mg by mouth 2 (two) times daily.     Marland Kitchen amLODipine (NORVASC) 5 MG tablet TAKE 1 TABLET (5 MG TOTAL) BY MOUTH DAILY. 30 tablet 11  . aspirin 81 MG EC tablet Take 81 mg by mouth daily.      . B-D 3CC LUER-LOK SYR 25GX1" 25G X 1" 3 ML MISC USE TO ADMINISTER B12 INJECTIONS 10 each 2  . B-D INS SYR ULTRAFINE 1CC/30G 30G X 1/2" 1 ML MISC USE TO ADMINISTER B12 INJECTIONS 10 each 1  . Cholecalciferol (EQL VITAMIN D3) 1000 UNITS tablet Take 1,000 Units by mouth daily.      Marland Kitchen conjugated estrogens (PREMARIN) vaginal cream 1/2 gram vaginally every night x 1 week, then twice weekly at bedtime 90 g 3  . cyanocobalamin (,VITAMIN B-12,) 1000 MCG/ML injection ADMINISTER 1 CC SUBQ EVERY 4 WEEKS AS DIRECTED 10 mL 1  . folic acid (FOLVITE) 1 MG tablet Take 1 mg by mouth daily. For restless legs    . gabapentin (NEURONTIN) 300 MG capsule TAKE ONE CAPSULE BY MOUTH EVERY DAY 90 capsule 2  . hydrochlorothiazide (MICROZIDE) 12.5 MG capsule TAKE ONE CAPSULE BY MOUTH EVERY DAY FOR BLOOD PRESSURE 90 capsule 2  . Insulin Syringe-Needle U-100 (B-D INS SYRINGE 0.5CC/30GX1/2") 30G X 1/2" 0.5 ML MISC by Does not apply route. To administer B12 injections     . latanoprost (XALATAN) 0.005 % ophthalmic solution Place 1 drop into both eyes at bedtime.     Marland Kitchen loratadine (CLARITIN) 10 MG tablet Take 1 tablet (10 mg total) by mouth daily. 90 tablet 3  . pantoprazole (PROTONIX) 40 MG tablet Take 1 tablet (40 mg total) by mouth daily. 90 tablet 3  . potassium chloride (KLOR-CON M10) 10 MEQ tablet Take 1 tablet  (10 mEq total) by mouth daily. 90 tablet 3  . simvastatin (ZOCOR) 20 MG tablet TAKE 1 TABLET (20 MG TOTAL) BY MOUTH DAILY. (Patient taking differently: 10 mg by mouth daily) 90 tablet 2  . omeprazole (PRILOSEC) 40 MG capsule Take 40 mg by mouth daily.  3  . omeprazole (PRILOSEC) 40 MG capsule TAKE ONE CAPSULE BY MOUTH EVERY DAY (Patient not taking: Reported on 03/03/2016) 90 capsule 1   No facility-administered medications prior to visit.     ROS Review of Systems  Constitutional: Negative for activity change, appetite change, chills, fatigue and unexpected weight change.  HENT: Negative for congestion, mouth sores and sinus pressure.   Eyes: Negative for visual disturbance.  Respiratory: Negative for cough and chest tightness.   Gastrointestinal: Negative for abdominal pain and nausea.  Genitourinary: Negative for difficulty urinating, frequency and vaginal pain.  Musculoskeletal: Negative for back pain and gait problem.  Skin: Negative for pallor and rash.  Neurological: Positive for dizziness, weakness and headaches. Negative for tremors and numbness.  Hematological: Bruises/bleeds easily.  Psychiatric/Behavioral: Negative for confusion and sleep disturbance.    Objective:  BP 120/66   Pulse 92   Wt 143 lb (64.9 kg)   SpO2 96%   BMI  25.74 kg/m   BP Readings from Last 3 Encounters:  03/03/16 120/66  02/08/16 135/74  02/08/16 (!) 121/57    Wt Readings from Last 3 Encounters:  03/03/16 143 lb (64.9 kg)  02/08/16 143 lb 4.8 oz (65 kg)  01/12/16 144 lb (65.3 kg)    Physical Exam  Constitutional: She appears well-developed. No distress.  HENT:  Head: Normocephalic.  Right Ear: External ear normal.  Left Ear: External ear normal.  Nose: Nose normal.  Mouth/Throat: Oropharynx is clear and moist.  Eyes: Conjunctivae are normal. Pupils are equal, round, and reactive to light. Right eye exhibits no discharge. Left eye exhibits no discharge.  Neck: Normal range of motion.  Neck supple. No JVD present. No tracheal deviation present. No thyromegaly present.  Cardiovascular: Normal rate, regular rhythm and normal heart sounds.   Pulmonary/Chest: No stridor. No respiratory distress. She has no wheezes.  Abdominal: Soft. Bowel sounds are normal. She exhibits no distension and no mass. There is no tenderness. There is no rebound and no guarding.  Musculoskeletal: She exhibits tenderness. She exhibits no edema.  Lymphadenopathy:    She has no cervical adenopathy.  Neurological: She displays normal reflexes. No cranial nerve deficit. She exhibits normal muscle tone. Coordination abnormal.  Skin: No rash noted. No erythema.  Psychiatric: She has a normal mood and affect. Her behavior is normal. Judgment and thought content normal.  L knee is bruised L head hurts to palpation Ataxic, stiff  Lab Results  Component Value Date   WBC 5.7 02/08/2016   HGB 14.4 02/08/2016   HCT 41.6 02/08/2016   PLT 135 (L) 02/08/2016   GLUCOSE 100 (H) 12/03/2015   CHOL 191 10/01/2014   TRIG 72.0 10/01/2014   HDL 57.80 10/01/2014   LDLCALC 119 (H) 10/01/2014   ALT 14 10/19/2015   AST 18 10/19/2015   NA 136 12/03/2015   K 4.0 12/03/2015   CL 99 12/03/2015   CREATININE 0.77 12/03/2015   BUN 20 12/03/2015   CO2 27 12/03/2015   TSH 3.38 12/03/2015   INR 1.0 10/07/2008   HGBA1C  10/07/2008    5.9 (NOTE)   The ADA recommends the following therapeutic goal for glycemic   control related to Hgb A1C measurement:   Goal of Therapy:   < 7.0% Hgb A1C   Reference: American Diabetes Association: Clinical Practice   Recommendations 2008, Diabetes Care,  2008, 31:(Suppl 1).   MICROALBUR 0.4 07/14/2009    Dg Chest 2 View  Result Date: 02/07/2015 CLINICAL DATA:  82 year old female with chest pain EXAM: CHEST  2 VIEW COMPARISON:  Radiograph dated 10/07/2008 FINDINGS: Two views of the chest demonstrate emphysematous changes of the lungs. There is no focal consolidation, pleural effusion, or  pneumothorax. The cardiomediastinal silhouette is within normal limits. The osseous structures are grossly unremarkable. IMPRESSION: No active cardiopulmonary disease. Electronically Signed   By: Anner Crete M.D.   On: 02/07/2015 23:01   US Abdomen Complete  Result Date: 02/08/2015 CLINICAL DATA:  Abdominal pain and nausea for the past 10 days, history of leukemia EXAM: ULTRASOUND ABDOMEN COMPLETE COMPARISON:  None. FINDINGS: The study is technically limited due to excessive bowel gas. Gallbladder: No gallstones or wall thickening visualized. No sonographic Murphy sign noted. Common bile duct: Diameter: 3 mm Liver: No focal lesion identified. Within normal limits in parenchymal echogenicity. There is a hypoechoic/ anechoic focus which lies between the right lobe of the liver in the right kidney. It measures 2.3 x 1.5 x 1.9 cm.  IVC: The IVC appears mildly distended. There are no abnormal intraluminal echoes. Pancreas: Visualized portion unremarkable. Spleen: Size and appearance within normal limits. Right Kidney: Length: 10.7 cm. Echogenicity within normal limits. No mass or hydronephrosis visualized. Left Kidney: Length: 11.4 cm. Echogenicity within normal limits. No mass or hydronephrosis visualized. Abdominal aorta: The aorta it exhibits normal caliber. The bifurcation could not be assessed due to bowel gas. Other findings: None. IMPRESSION: 1. The study is limited due to bowel gas. 2. No definite acute hepatobiliary abnormality is demonstrated. There is a cystic appearing structure measuring up to 2.3 cm in diameter were which lies in the soft tissues between the right hepatic lobe in the right kidney. 3. Mild dilation of the IVC which suggests increased right heart pressures. 4. Given the increased abdominal gas and the limitations on this study, abdominal and pelvic CT scanning would be useful. Electronically Signed   By: David  Martinique M.D.   On: 02/08/2015 14:38    Assessment & Plan:   Diagnoses  and all orders for this visit:  Chronic lymphocytic leukemia ([facility]) -     latanoprost (XALATAN) 0.005 % ophthalmic solution; Place 1 drop into both eyes at bedtime.   I am having Ms. Loving maintain her Insulin Syringe-Needle U-100, aspirin, acetaminophen, Cholecalciferol, latanoprost, B-D INS SYR ULTRAFINE 123XX123, folic acid, B-D 3CC LUER-LOK SYR 25GX1", amLODipine, potassium chloride, gabapentin, hydrochlorothiazide, cyanocobalamin, loratadine, pantoprazole, conjugated estrogens, simvastatin, and omeprazole.  No orders of the defined types were placed in this encounter.    Follow-up: No Follow-up on file.  Walker Kehr, MD

## 2016-03-03 NOTE — Assessment & Plan Note (Signed)
02/12/16 fall CT head Labs PT at home

## 2016-03-03 NOTE — Progress Notes (Signed)
Pre visit review using our clinic review tool, if applicable. No additional management support is needed unless otherwise documented below in the visit note. 

## 2016-03-03 NOTE — Telephone Encounter (Signed)
[name], pls inform Mrs Helou She has a bladder infection. I emailed her an antibiotic to her drug store. Thx

## 2016-03-04 ENCOUNTER — Encounter: Payer: Self-pay | Admitting: Internal Medicine

## 2016-03-05 ENCOUNTER — Other Ambulatory Visit: Payer: Self-pay | Admitting: Internal Medicine

## 2016-03-06 NOTE — Telephone Encounter (Signed)
Pt informed

## 2016-03-07 ENCOUNTER — Ambulatory Visit (HOSPITAL_BASED_OUTPATIENT_CLINIC_OR_DEPARTMENT_OTHER): Payer: Medicare Other

## 2016-03-07 ENCOUNTER — Other Ambulatory Visit: Payer: Medicare Other

## 2016-03-07 ENCOUNTER — Other Ambulatory Visit: Payer: Self-pay | Admitting: Nurse Practitioner

## 2016-03-07 VITALS — BP 126/77 | HR 63 | Temp 97.8°F | Resp 16

## 2016-03-07 DIAGNOSIS — C9110 Chronic lymphocytic leukemia of B-cell type not having achieved remission: Secondary | ICD-10-CM | POA: Diagnosis not present

## 2016-03-07 DIAGNOSIS — B3781 Candidal esophagitis: Principal | ICD-10-CM

## 2016-03-07 DIAGNOSIS — B370 Candidal stomatitis: Secondary | ICD-10-CM

## 2016-03-07 MED ORDER — DIPHENHYDRAMINE HCL 25 MG PO CAPS
ORAL_CAPSULE | ORAL | Status: AC
  Filled 2016-03-07: qty 1

## 2016-03-07 MED ORDER — FLUCONAZOLE 150 MG PO TABS: 150.0000 mg | ORAL_TABLET | Freq: Once | ORAL | 1 refills | Status: AC

## 2016-03-07 MED ORDER — DIPHENHYDRAMINE HCL 25 MG PO CAPS
25.0000 mg | ORAL_CAPSULE | Freq: Once | ORAL | Status: AC
  Administered 2016-03-07: 25 mg via ORAL

## 2016-03-07 MED ORDER — IMMUNE GLOBULIN (HUMAN) 10 GM/100ML IV SOLN
30.0000 g | Freq: Once | INTRAVENOUS | Status: AC
  Administered 2016-03-07: 30 g via INTRAVENOUS
  Filled 2016-03-07: qty 200

## 2016-03-07 NOTE — Progress Notes (Signed)
Patient c/o mouth "burning."  She states she feels this is related to current antibiotic treatment.  Per patient, this has not impacted oral intake.  Notified Selena Lesser, NP.  Will send script to pharmacy on file.

## 2016-03-08 ENCOUNTER — Other Ambulatory Visit: Payer: Self-pay | Admitting: Internal Medicine

## 2016-03-08 MED ORDER — FLUCONAZOLE 150 MG PO TABS: 150.0000 mg | ORAL_TABLET | Freq: Once | ORAL | 1 refills | Status: AC

## 2016-03-10 ENCOUNTER — Ambulatory Visit (INDEPENDENT_AMBULATORY_CARE_PROVIDER_SITE_OTHER)
Admission: RE | Admit: 2016-03-10 | Discharge: 2016-03-10 | Disposition: A | Discharge status: Home / Self Care | Payer: Medicare Other | Source: Ambulatory Visit | Attending: Internal Medicine | Admitting: Internal Medicine

## 2016-03-10 DIAGNOSIS — F0781 Postconcussional syndrome: Secondary | ICD-10-CM

## 2016-03-10 DIAGNOSIS — W1809XA Striking against other object with subsequent fall, initial encounter: Secondary | ICD-10-CM

## 2016-03-12 ENCOUNTER — Other Ambulatory Visit: Payer: Self-pay | Admitting: Internal Medicine

## 2016-03-12 MED ORDER — CEFDINIR 300 MG PO CAPS: 300.0000 mg | ORAL_CAPSULE | Freq: Two times a day (BID) | ORAL | 0 refills | Status: DC

## 2016-03-14 ENCOUNTER — Telehealth: Payer: Self-pay | Admitting: Internal Medicine

## 2016-03-14 MED ORDER — NYSTATIN 100000 UNIT/ML MT SUSP: 500000.0000 [IU] | Freq: Four times a day (QID) | OROMUCOSAL | 0 refills | Status: AC

## 2016-03-14 NOTE — Telephone Encounter (Signed)
Pt called stating Dr. Camila Li put her on antibiotic and now she has some white patches and burning inside her mouth. Pt was wondering if Dr. Camila Li can send in magic mouthwash with lidocaine to [facility]? Please advise.

## 2016-03-14 NOTE — Telephone Encounter (Signed)
Ok Nystatin Thx

## 2016-03-15 ENCOUNTER — Encounter: Payer: Self-pay | Admitting: Obstetrics and Gynecology

## 2016-03-15 ENCOUNTER — Ambulatory Visit (INDEPENDENT_AMBULATORY_CARE_PROVIDER_SITE_OTHER): Payer: Medicare Other | Admitting: Obstetrics and Gynecology

## 2016-03-15 VITALS — BP 118/70 | HR 76 | Resp 15 | Wt 140.0 lb

## 2016-03-15 DIAGNOSIS — N905 Atrophy of vulva: Secondary | ICD-10-CM

## 2016-03-15 DIAGNOSIS — N952 Postmenopausal atrophic vaginitis: Secondary | ICD-10-CM

## 2016-03-15 NOTE — Telephone Encounter (Signed)
Notified pt MD sent mouth wash to pharmacy. Pt states she pick up an [place] yesterday she thought that was for the yeast infection in her mouth. Inform pt will ask MD why was that antibiotic rx and give her a call back. Dr. Alain Marion pt is wanting to know why was the [facility] rx. She stated that she completed the first antibiotic Ceftin...Johny Chess

## 2016-03-15 NOTE — Progress Notes (Signed)
GYNECOLOGY  VISIT   HPI: 82 y.o.   Married  Caucasian  female   G11P4 with No LMP recorded. Patient is postmenopausal.   here for  Follow up vaginal pain. She was seen several months ago, had a negative wet prep probe, she was started on premarin vaginal cream. She is just using a pea sized amount of vaginal premarin at the opening of her vagina. Not using it internally.  She fell in July and had a concussion. She was also treated for a UTI and developed an oral yeast infection. She was treated with diflucan. Still not better.  She brought in some of her art work (beautiful).     GYNECOLOGIC HISTORY: No LMP recorded. Patient is postmenopausal. Contraception:postmenopause Menopausal hormone therapy: Premarin        OB History    Gravida Para Term Preterm AB Living   4 4       4    SAB TAB Ectopic Multiple Live Births                     Patient Active Problem List   Diagnosis Date Noted  . Post concussion syndrome 03/03/2016  . Fall against object 03/03/2016  . Cystocele 03/05/2015  . Nausea without vomiting 02/15/2015  . Pain in throat 02/08/2015  . Dehydration 02/08/2015  . Hyponatremia 02/08/2015  . Vaginal pain 01/27/2015  . Well adult exam 10/01/2014  . Grief at loss of child 12/11/2013  . Irregular heart beat 09/29/2013  . Humeral head fracture 08/26/2013  . Actinic keratoses 08/26/2013  . Carotid bruit 05/02/2012  . Anxiety 03/17/2011  . Halitosis 12/14/2010  . HYPOKALEMIA 05/18/2008  . Cystitis 04/21/2008  . PLANTAR FASCIITIS, RIGHT 10/10/2007  . ANEMIA, PERNICIOUS, HX OF 10/10/2007  . PERIPHERAL NEUROPATHY 09/04/2007  . SINUSITIS, ETHMOIDAL 09/04/2007  . Chronic fatigue 08/06/2007  . Headache(784.0) 08/06/2007  . SINUSITIS- ACUTE-NOS 07/29/2007  . Chronic lymphocytic leukemia ([place]) 06/17/2007  . ALLERGIC RHINITIS 06/17/2007  . OSTEOARTHRITIS 06/17/2007  . B12 deficiency 06/07/2007  . GASTROESOPHAGEAL REFLUX DISEASE 04/30/2007  . GASTRITIS 04/30/2007  .  Dyslipidemia 12/19/2006  . Essential hypertension 12/19/2006  . DIVERTICULOSIS, COLON 05/19/2003  . COLONIC POLYPS, HX OF 01/10/2000    Past Medical History:  Diagnosis Date  . Allergy    rhinitis  . Chronic ethmoidal sinusitis   . Diverticulosis of colon   . Glaucoma   . Hx of colonic polyps   . Hyperlipidemia   . Hypertension   . Hypogammaglobulinemia ([place])    Monthly IVIG  . Leukemia ([place])    Chronic lymphocytic leukemia  . Osteoarthritis   . Peripheral neuropathy ([place])   . Pernicious anemia   . UTI (lower urinary tract infection) 2010  . Vitamin B12 deficiency     Past Surgical History:  Procedure Laterality Date  . ABDOMINAL HYSTERECTOMY  1987  . bilateral cataracts  2007  . bladder tack  1987  . L4-L5 Laminetomy  1999  . TUBAL LIGATION    . tubaligation  1961  . ureter reconstruction  1992    Current Outpatient Prescriptions  Medication Sig Dispense Refill  . acetaminophen (TYLENOL) 500 MG tablet Take 500 mg by mouth 2 (two) times daily.     Marland Kitchen amLODipine (NORVASC) 5 MG tablet TAKE 1 TABLET (5 MG TOTAL) BY MOUTH DAILY. 30 tablet 11  . aspirin 81 MG EC tablet Take 81 mg by mouth daily.      . B-D 3CC LUER-LOK SYR 25GX1" 25G  X 1" 3 ML MISC USE TO ADMINISTER B12 INJECTIONS 10 each 2  . B-D INS SYR ULTRAFINE 1CC/30G 30G X 1/2" 1 ML MISC USE TO ADMINISTER B12 INJECTIONS 10 each 1  . Cholecalciferol (EQL VITAMIN D3) 1000 UNITS tablet Take 1,000 Units by mouth daily.      Marland Kitchen conjugated estrogens (PREMARIN) vaginal cream 1/2 gram vaginally every night x 1 week, then twice weekly at bedtime 90 g 3  . cyanocobalamin (,VITAMIN B-12,) 1000 MCG/ML injection ADMINISTER 1 CC SUBQ EVERY 4 WEEKS AS DIRECTED 10 mL 1  . folic acid (FOLVITE) 1 MG tablet Take 1 mg by mouth daily. For restless legs    . gabapentin (NEURONTIN) 300 MG capsule TAKE ONE CAPSULE BY MOUTH EVERY DAY 90 capsule 2  . hydrochlorothiazide (MICROZIDE) 12.5 MG capsule TAKE ONE CAPSULE BY MOUTH EVERY DAY FOR BLOOD  PRESSURE 90 capsule 2  . Insulin Syringe-Needle U-100 (B-D INS SYRINGE 0.5CC/30GX1/2") 30G X 1/2" 0.5 ML MISC by Does not apply route. To administer B12 injections     . KLOR-CON M10 10 MEQ tablet TAKE 1 TABLET (10 MEQ TOTAL) BY MOUTH DAILY. 90 tablet 3  . latanoprost (XALATAN) 0.005 % ophthalmic solution Place 1 drop into both eyes at bedtime. 2.5 mL 5  . loratadine (CLARITIN) 10 MG tablet Take 1 tablet (10 mg total) by mouth daily. 90 tablet 3  . pantoprazole (PROTONIX) 40 MG tablet Take 1 tablet (40 mg total) by mouth daily. 90 tablet 3  . simvastatin (ZOCOR) 20 MG tablet TAKE 1 TABLET (20 MG TOTAL) BY MOUTH DAILY. (Patient taking differently: 10 mg by mouth daily) 90 tablet 2  . nystatin (MYCOSTATIN) 100000 UNIT/ML suspension Take 5 mLs (500,000 Units total) by mouth 4 (four) times daily. Swish, hold and swallow (Patient not taking: Reported on 03/15/2016) 240 mL 0   No current facility-administered medications for this visit.      ALLERGIES: Pneumococcal vaccines; Clarithromycin; Hctz [hydrochlorothiazide]; Oxycodone-aspirin; Propoxyphene n-acetaminophen; and Simvastatin  Family History  Problem Relation Age of Onset  . Cancer Mother     uterine  . Diabetes Mother   . Jaundice Father   . Other Sister     brain tumor  . Cancer Sister   . Stroke Son 58    in NH  . Hypertension Other   . Cancer Other     breast  . Thyroid disease Sister   . Cancer Sister     thyroid cancer    Social History   Social History  . Marital status: Married    Spouse name: N/A  . Number of children: N/A  . Years of education: N/A   Occupational History  . Not on file.   Social History Main Topics  . Smoking status: Never Smoker  . Smokeless tobacco: Never Used  . Alcohol use No  . Drug use: No  . Sexual activity: No   Other Topics Concern  . Not on file   Social History Narrative  . No narrative on file    Review of Systems  Constitutional: Negative.   HENT: Negative.   Eyes:  Negative.   Respiratory: Negative.   Cardiovascular: Negative.   Gastrointestinal: Negative.   Genitourinary:       Vaginal discomfort  Musculoskeletal: Negative.   Skin: Negative.   Neurological: Negative.   Endo/Heme/Allergies: Negative.   Psychiatric/Behavioral: Negative.     PHYSICAL EXAMINATION:    BP 118/70 (BP Location: Right Arm, Patient Position: Sitting, Cuff Size: Normal)  Pulse 76   Resp 15   Wt 140 lb (63.5 kg)   BMI 25.20 kg/m     General appearance: alert, cooperative and appears stated age  Pelvic: External genitalia:  no lesions, mild atrophy              Urethra:  normal appearing urethra with no masses, tenderness or lesions              Bartholins and Skenes: normal                 Vagina: normal appearing atrophic vagina with normal color and discharge, no lesions, grade 1  cystocele and grade 1 rectocele              Cervix:absent  Chaperone was present for exam.  ASSESSMENT Vaginal atrophy, feels much better with using topical estrogen   PLAN Recommended she try and space the cream out to 2-3 x a week Call with any concerns or if she needs more premarin   An After Visit Summary was printed and given to the patient.

## 2016-03-15 NOTE — Telephone Encounter (Signed)
Dr. Alain Marion pt is wanting to know why was the [facility] rx....Johny Chess

## 2016-03-15 NOTE — Telephone Encounter (Signed)
Ok Thx 

## 2016-03-16 NOTE — Telephone Encounter (Signed)
For deep sinusitis - pls see my MyChart email for CT Thx

## 2016-03-22 ENCOUNTER — Telehealth: Payer: Self-pay | Admitting: Internal Medicine

## 2016-03-22 NOTE — Telephone Encounter (Signed)
Pt called in and is having some side effect of some meds that she is taking.  She is would like a nurse to call her back as soon has possible

## 2016-03-22 NOTE — Telephone Encounter (Signed)
I called pt- She states the nystatin has not helped. She started taking it last Tuesday and she still c/o mouth burning. She wants to know what else she can do. Please advise.

## 2016-03-23 NOTE — Telephone Encounter (Signed)
Left mess for patient to call back to advise of below. 

## 2016-03-23 NOTE — Telephone Encounter (Signed)
Gargle with baking soda solution Thx

## 2016-03-23 NOTE — Telephone Encounter (Signed)
Pt ware

## 2016-03-24 ENCOUNTER — Telehealth: Payer: Self-pay | Admitting: Internal Medicine

## 2016-03-24 NOTE — Telephone Encounter (Signed)
MD out pf office pls advise on msg from team health...[name]

## 2016-03-24 NOTE — Telephone Encounter (Signed)
Notified pt w/MD response. Made appt for Sat. Clinic...Johny Chess

## 2016-03-24 NOTE — Telephone Encounter (Signed)
Patient Name: JERZY SPITZLEY  DOB: [birth year]    Initial Comment Caller states she's having mouth pain. Since August 15th.Marland KitchenMarland KitchenShe does have Luekemia, no fever.   Nurse Assessment  Nurse: Raphael Gibney, RN, Vanita Ingles Date/Time (Eastern Time): 03/24/2016 2:34:26 PM  Confirm and document reason for call. If symptomatic, describe symptoms. You must click the next button to save text entered. ---Caller states she has burning in her mouth since Aug 15th. She has been using nystatin which is not helping. Has been using rinsing her mouth with baking soda and water which is not helping. no sores. She takes medication to help her not have infections.  Has the patient traveled out of the country within the last 30 days? ---Not Applicable  Does the patient have any new or worsening symptoms? ---Yes  Will a triage be completed? ---Yes  Related visit to physician within the last 2 weeks? ---No  Does the PT have any chronic conditions? (i.e. diabetes, asthma, etc.) ---Yes  List chronic conditions. ---leukemia  Is this a behavioral health or substance abuse call? ---No     Guidelines    Guideline Title Affirmed Question Affirmed Notes  Mouth Symptoms Weak immune system (e.g., HIV positive, cancer chemo, splenectomy, organ transplant, chronic steroids)    Final Disposition User   See PCP When Office is Open (within 3 days) Raphael Gibney, RN, Vanita Ingles    Comments  Pt states she can not come into the office as she does not have transportation. She would like to know if there is a home remedy or if there is a presciption that can be called in for her mouth. Please call pt back regarding medication.   Referrals  GO TO FACILITY REFUSED   Disagree/Comply: Disagree  Disagree/Comply Reason: Unable to find transportation

## 2016-03-24 NOTE — Telephone Encounter (Signed)
Given that she is having the symptoms for 2 weeks without response to rx medications would recommend being seen and not giving any prescriptions. Would recommend to stop using the baking soda in her mouth as this could be causing damage.

## 2016-03-25 ENCOUNTER — Encounter: Payer: Self-pay | Admitting: Family Medicine

## 2016-03-25 ENCOUNTER — Ambulatory Visit (INDEPENDENT_AMBULATORY_CARE_PROVIDER_SITE_OTHER): Payer: Medicare Other | Admitting: Family Medicine

## 2016-03-25 DIAGNOSIS — K146 Glossodynia: Secondary | ICD-10-CM | POA: Insufficient documentation

## 2016-03-25 MED ORDER — MAGIC MOUTHWASH W/LIDOCAINE: 5.0000 mL | Freq: Three times a day (TID) | ORAL | 0 refills | Status: DC | PRN

## 2016-03-25 NOTE — Assessment & Plan Note (Signed)
Ongoing issue. Exam normal. Supportive care and magic mouthwash with lidocaine.

## 2016-03-25 NOTE — Progress Notes (Signed)
   Subjective:  Patient ID: [name], female    DOB: Jul 10, 1926  Age: 82 y.o. MRN: [id number]  CC: Burning mouth/tongue  HPI:  82 year old female presents with the above complaints.  Patient has recently had several courses of antibiotics. She states that she has been experiencing burning tongue since 8/15. She has used nystatin and a baking soda mouthwash without significant improvement. No known exacerbating or relieving factors. No other associated symptoms. No mucosal lesions. No other complaints at this time.  Social Hx   Social History   Social History  . Marital status: Married    Spouse name: N/A  . Number of children: N/A  . Years of education: N/A   Social History Main Topics  . Smoking status: Never Smoker  . Smokeless tobacco: Never Used  . Alcohol use No  . Drug use: No  . Sexual activity: No   Other Topics Concern  . None   Social History Narrative  . None   Review of Systems  Constitutional: Negative.   HENT:       Burning tongue.   Objective:  BP 116/70 (BP Location: Left Arm, Patient Position: Sitting, Cuff Size: Normal)   Pulse 74   Temp 97.7 F (36.5 C) (Oral)   Resp 18   Ht 5' 2.5" (1.588 m)   Wt 141 lb 4 oz (64.1 kg)   SpO2 97%   BMI 25.42 kg/m   BP/Weight 03/25/2016 03/15/2016 0000000  Systolic BP 99991111 [id number]  Diastolic BP 70 70 77  Wt. (Lbs) 141.25 140 -  BMI 25.42 25.2 -   Physical Exam  Constitutional: She appears well-developed. No distress.  HENT:  Mouth and tongue normal. No mucosal lesions. Oropharynx clear. Normal TMs bilaterally.  Cardiovascular: Normal rate and regular rhythm.   Pulmonary/Chest: Effort normal and breath sounds normal.  Neurological: She is alert.  Vitals reviewed.  Lab Results  Component Value Date   WBC 7.2 03/03/2016   HGB 14.9 03/03/2016   HCT 44.6 03/03/2016   PLT 165.0 03/03/2016   GLUCOSE 100 (H) 03/03/2016   CHOL 191 10/01/2014   TRIG 72.0 10/01/2014   HDL 57.80 10/01/2014   LDLCALC 119 (H) 10/01/2014   ALT 14 10/19/2015   AST 18 10/19/2015   NA 135 03/03/2016   K 4.4 03/03/2016   CL 97 03/03/2016   CREATININE 0.97 03/03/2016   BUN 19 03/03/2016   CO2 32 03/03/2016   TSH 3.38 12/03/2015   INR 1.0 10/07/2008   HGBA1C  10/07/2008    5.9 (NOTE)   The ADA recommends the following therapeutic goal for glycemic   control related to Hgb A1C measurement:   Goal of Therapy:   < 7.0% Hgb A1C   Reference: American Diabetes Association: Clinical Practice   Recommendations 2008, Diabetes Care,  2008, 31:(Suppl 1).   MICROALBUR 0.4 07/14/2009   Assessment & Plan:   Problem List Items Addressed This Visit    Burning tongue    Ongoing issue. Exam normal. Supportive care and magic mouthwash with lidocaine.       Other Visit Diagnoses   None.     Meds ordered this encounter  Medications  . magic mouthwash w/lidocaine SOLN    Sig: Take 5 mLs by mouth 3 (three) times daily as needed for mouth pain.    Dispense:  120 mL    Refill:  0   Follow-up: PRN  [place]

## 2016-03-25 NOTE — Patient Instructions (Signed)
Use the mouthwash as prescribed.  Your exam was normal.   Follow up closely with your PCP.  Take care  Dr. Lacinda Axon

## 2016-03-25 NOTE — Progress Notes (Signed)
Pre visit review using our clinic review tool, if applicable. No additional management support is needed unless otherwise documented below in the visit note. 

## 2016-03-31 ENCOUNTER — Telehealth: Payer: Self-pay | Admitting: Emergency Medicine

## 2016-03-31 NOTE — Telephone Encounter (Signed)
[place] would like verbal orders for the patient to receive PT twice a week for 4 week starting next week. Please follow up thanks.

## 2016-04-03 NOTE — Telephone Encounter (Signed)
Ok Thx 

## 2016-04-04 ENCOUNTER — Ambulatory Visit (HOSPITAL_BASED_OUTPATIENT_CLINIC_OR_DEPARTMENT_OTHER): Payer: Medicare Other

## 2016-04-04 ENCOUNTER — Other Ambulatory Visit: Payer: Medicare Other

## 2016-04-04 VITALS — BP 126/66 | HR 64 | Temp 98.5°F | Resp 18

## 2016-04-04 DIAGNOSIS — C9110 Chronic lymphocytic leukemia of B-cell type not having achieved remission: Secondary | ICD-10-CM | POA: Diagnosis not present

## 2016-04-04 MED ORDER — DIPHENHYDRAMINE HCL 25 MG PO CAPS
ORAL_CAPSULE | ORAL | Status: AC
  Filled 2016-04-04: qty 1

## 2016-04-04 MED ORDER — DIPHENHYDRAMINE HCL 25 MG PO CAPS
25.0000 mg | ORAL_CAPSULE | Freq: Once | ORAL | Status: AC
  Administered 2016-04-04: 25 mg via ORAL

## 2016-04-04 MED ORDER — SODIUM CHLORIDE 0.9 % IV SOLN
INTRAVENOUS | Status: DC
  Administered 2016-04-04: 10:00:00 via INTRAVENOUS

## 2016-04-04 MED ORDER — IMMUNE GLOBULIN (HUMAN) 10 GM/100ML IV SOLN
30.0000 g | Freq: Once | INTRAVENOUS | Status: AC
  Administered 2016-04-04: 30 g via INTRAVENOUS
  Filled 2016-04-04: qty 100

## 2016-04-04 NOTE — Patient Instructions (Signed)

## 2016-04-04 NOTE — Telephone Encounter (Signed)
[name], PT no answer LMOM w/MD response...Johny Chess

## 2016-04-12 ENCOUNTER — Ambulatory Visit: Payer: Medicare Other | Admitting: Internal Medicine

## 2016-04-14 ENCOUNTER — Other Ambulatory Visit: Payer: Self-pay | Admitting: Internal Medicine

## 2016-04-17 ENCOUNTER — Telehealth: Payer: Self-pay

## 2016-04-17 NOTE — Telephone Encounter (Signed)
Home Health Cert/Plan of Care received signed. Faxed and copy sent to scan

## 2016-05-01 ENCOUNTER — Other Ambulatory Visit: Payer: Self-pay | Admitting: Internal Medicine

## 2016-05-01 ENCOUNTER — Telehealth: Payer: Self-pay | Admitting: *Deleted

## 2016-05-01 NOTE — Telephone Encounter (Signed)
Call from pt reporting burning pain on tongue that "spread to my tonsils." Stated her grandson had similar symptoms and was told he has an infection in his stomach. Asks if she can have labs drawn to rule this out. She has called PCP as well. Instructed her to follow up with PCP for this.  Message forwarded to Dr. Benay Spice for review.

## 2016-05-02 ENCOUNTER — Ambulatory Visit (HOSPITAL_BASED_OUTPATIENT_CLINIC_OR_DEPARTMENT_OTHER): Payer: Medicare Other

## 2016-05-02 ENCOUNTER — Other Ambulatory Visit (HOSPITAL_BASED_OUTPATIENT_CLINIC_OR_DEPARTMENT_OTHER): Payer: Medicare Other

## 2016-05-02 VITALS — BP 110/55 | HR 59 | Temp 97.9°F | Resp 18

## 2016-05-02 DIAGNOSIS — C9110 Chronic lymphocytic leukemia of B-cell type not having achieved remission: Secondary | ICD-10-CM

## 2016-05-02 LAB — CBC WITH DIFFERENTIAL/PLATELET
BASO%: 0.8 % (ref 0.0–2.0)
BASOS ABS: 0 10*3/uL (ref 0.0–0.1)
EOS ABS: 0.1 10*3/uL (ref 0.0–0.5)
EOS%: 2.3 % (ref 0.0–7.0)
HEMATOCRIT: 42.1 % (ref 34.8–46.6)
HGB: 13.9 g/dL (ref 11.6–15.9)
LYMPH%: 33.8 % (ref 14.0–49.7)
MCH: 30.5 pg (ref 25.1–34.0)
MCHC: 33 g/dL (ref 31.5–36.0)
MCV: 92.5 fL (ref 79.5–101.0)
MONO#: 0.5 10*3/uL (ref 0.1–0.9)
MONO%: 8 % (ref 0.0–14.0)
NEUT#: 3.2 10*3/uL (ref 1.5–6.5)
NEUT%: 55.1 % (ref 38.4–76.8)
PLATELETS: 138 10*3/uL — AB (ref 145–400)
RBC: 4.55 10*6/uL (ref 3.70–5.45)
RDW: 14.3 % (ref 11.2–14.5)
WBC: 5.9 10*3/uL (ref 3.9–10.3)
lymph#: 2 10*3/uL (ref 0.9–3.3)

## 2016-05-02 MED ORDER — DIPHENHYDRAMINE HCL 25 MG PO CAPS
ORAL_CAPSULE | ORAL | Status: AC
  Filled 2016-05-02: qty 1

## 2016-05-02 MED ORDER — DIPHENHYDRAMINE HCL 25 MG PO CAPS
25.0000 mg | ORAL_CAPSULE | Freq: Once | ORAL | Status: AC
  Administered 2016-05-02: 25 mg via ORAL

## 2016-05-02 MED ORDER — IMMUNE GLOBULIN (HUMAN) 10 GM/100ML IV SOLN
30.0000 g | Freq: Once | INTRAVENOUS | Status: AC
  Administered 2016-05-02: 30 g via INTRAVENOUS
  Filled 2016-05-02: qty 200

## 2016-05-02 NOTE — Patient Instructions (Signed)

## 2016-05-03 LAB — IGG, IGA, IGM
IgA, Qn, Serum: 238 mg/dL (ref 64–422)
IgM, Qn, Serum: 47 mg/dL (ref 26–217)

## 2016-05-04 ENCOUNTER — Other Ambulatory Visit: Payer: Self-pay | Admitting: *Deleted

## 2016-05-04 ENCOUNTER — Encounter: Payer: Self-pay | Admitting: Nurse Practitioner

## 2016-05-04 ENCOUNTER — Ambulatory Visit (INDEPENDENT_AMBULATORY_CARE_PROVIDER_SITE_OTHER): Payer: Medicare Other | Admitting: Nurse Practitioner

## 2016-05-04 ENCOUNTER — Telehealth: Payer: Self-pay | Admitting: Nurse Practitioner

## 2016-05-04 VITALS — BP 128/78 | HR 88 | Temp 97.6°F | Ht 62.5 in | Wt 143.0 lb

## 2016-05-04 DIAGNOSIS — R0981 Nasal congestion: Secondary | ICD-10-CM

## 2016-05-04 DIAGNOSIS — K146 Glossodynia: Secondary | ICD-10-CM

## 2016-05-04 DIAGNOSIS — Z23 Encounter for immunization: Secondary | ICD-10-CM

## 2016-05-04 MED ORDER — FIRST-DUKES MOUTHWASH MT SUSP: 5.0000 mL | Freq: Three times a day (TID) | OROMUCOSAL | 0 refills | Status: AC

## 2016-05-04 MED ORDER — FLUTICASONE PROPIONATE 50 MCG/ACT NA SUSP: 2.0000 | Freq: Every day | NASAL | 0 refills | Status: DC

## 2016-05-04 MED ORDER — CETIRIZINE HCL 10 MG PO TABS: 10.0000 mg | ORAL_TABLET | Freq: Every day | ORAL | 0 refills | Status: DC

## 2016-05-04 NOTE — Telephone Encounter (Signed)
Patient has not yet picked up mouthwash ----, and she states she is worried about side effects with flonase especially with her existing conditions---she is going to double check with her pharmacist about ingredients and make a decision about whether she wants to come back to office to see another pcp

## 2016-05-04 NOTE — Patient Instructions (Addendum)
Refer to ENT if no improvement with current treatment.  Oral Mucositis Oral mucositis is a mouth condition that may develop from treatments for cancer. With this condition, sores may appear on your lips, gums, tongue, throat, and the top (roof) or bottom (floor) of your mouth. CAUSES Oral mucositis can happen to anyone who is being treated with cancer therapies, including:  Cancer medicines (chemotherapy).  Radiation therapy.  Bone marrow transplants and stem cell transplants. Cancer treatments can damage the lining of the mouth, and that causes this condition. Oral mucositis is not caused by infection. However, the sores can become infected after they form. Infection can make oral mucositis worse. RISK FACTORS This condition is more likely to develop in people:  With poor oral hygiene.  With dental problems or oral diseases.  Who use any tobacco product, including cigarettes, chewing tobacco, and electronic cigarettes.  Who drink alcohol.  Who have other medical conditions, such as diabetes, HIV, AIDS, or kidney disease.  Who do not drink enough clear fluids.  Who wear dentures that do not fit correctly.  Who have cancers that primarily affect the blood.  Who are female. SYMPTOMS Symptoms can vary from mild to severe. Symptoms are usually seen 7-10 days after cancer treatment has started. Symptoms include:  Mouth sores. These sores may bleed.  Color changes inside the mouth. Red, shiny areas may appear.  White patches or pus in the mouth.  Pain in the mouth and throat. This can make it painful to speak.  Dryness and a burning feeling in the mouth.  Saliva that is dry and thick.  Trouble eating, drinking, and swallowing. This can lead to weight loss. DIAGNOSIS This condition can be diagnosed with a physical exam. TREATMENT Treatment depends on the severity of the condition. Oral mucositis often heals on its own. Sometimes, changes in the cancer treatment can help.  Treatment may include medicines, such as:  An antibiotic medicine to fight infection, if present.  Medicine to help the cells in your mouth heal more quickly. Medicine may also be given to help control pain. This may include:  Pain relievers that are swished around in the mouth. These make the mouth numb to ease the pain (topical anesthetics).   Mouth rinses.  Prescribed, medicated gels. The gel coats the mouth. This protects nerve endings and lessens pain.  Narcotic pain medicines. HOME CARE INSTRUCTIONS Medicines  If you were prescribed an antibiotic medicine, finish all of it even if you start to feel better.  Take or apply medicines only as directed by your health care provider. Lifestyle  Keeping your mouth clean and germ-free is important. To maintain good oral hygiene:  Brush your teeth carefully with a soft, nylon-bristled toothbrush at least two times each day. Use a gentle toothpaste. Ask your health care provider for a toothpaste recommendation.  Floss your teeth every day.  Have your teeth cleaned regularly as recommended by your dentist.  Rinse your mouth after every meal or as directed by your health care provider. Do not use mouthwash that contains alcohol. Ask your health care provider for a mouthwash recommendation.  Do not use any tobacco products, including cigarettes, chewing tobacco, and electronic cigarettes. If you need help quitting, ask your health care provider.  Avoid eating:  Hot and spicy foods.  Citrus.  Foods that have sharp edges, such as chips.  Sugary foods, such as candy.  Do not drink alcohol. General Instructions  Clean the sores as directed by your health care provider.  If your lips are dry or cracked, apply a water-based moisturizer to your lips as needed.  Try sucking on ice chips or sugar-free frozen pops. This may help with pain. This also keeps your mouth moist.  Drink enough fluid to keep your urine clear or pale  yellow.  If you are losing weight, talk with your health care provider.  If you have dentures, take them out often as directed by your health care provider.  Keep all follow-up visits as directed by your health care provider. This is important. SEEK MEDICAL CARE IF:  You have mouth pain or throat pain.  You are having more trouble swallowing.  Your symptoms get worse.  You have new symptoms.  Your pain is not controlled with medicine. SEEK IMMEDIATE MEDICAL CARE IF:  You have a lot of bleeding in your mouth.  You have trouble speaking.  You develop new, open, or draining sores in your mouth.  You cannot swallow solid food or liquids.  You have a fever.   This information is not intended to replace advice given to you by your health care provider. Make sure you discuss any questions you have with your health care provider.   Document Released: 02/24/2011 Document Revised: 11/24/2014 Document Reviewed: 07/06/2014 Elsevier Interactive Patient Education [employer].

## 2016-05-04 NOTE — Progress Notes (Signed)
Subjective:  Patient ID: [name], female    DOB: 10/11/1925  Age: 82 y.o. MRN: [id number]  CC: Oral Swelling (Pt stated still having burning sensation on the tongue, wall of the mouth, and tonsil.)   HPI Presents with persistent oral burning sensation x 49month, onset after use of oral abx to treat sinusitis. Also conplains of nasal and sinus congestion, and post nasal drainage. Denies any fever or headache or ear pain or dizziness or dysphagia or sorethroat or change in voice. Has used magic mouth wash with lidocaine with minimal relief.  Outpatient Medications Prior to Visit  Medication Sig Dispense Refill  . acetaminophen (TYLENOL) 500 MG tablet Take 500 mg by mouth 2 (two) times daily.     Marland Kitchen amLODipine (NORVASC) 5 MG tablet TAKE 1 TABLET (5 MG TOTAL) BY MOUTH DAILY. 30 tablet 11  . aspirin 81 MG EC tablet Take 81 mg by mouth daily.      . B-D 3CC LUER-LOK SYR 25GX1" 25G X 1" 3 ML MISC USE TO ADMINISTER B12 INJECTIONS 10 each 2  . B-D INS SYR ULTRAFINE 1CC/30G 30G X 1/2" 1 ML MISC USE TO ADMINISTER B12 INJECTIONS 10 each 1  . Cholecalciferol (EQL VITAMIN D3) 1000 UNITS tablet Take 1,000 Units by mouth daily.      Marland Kitchen conjugated estrogens (PREMARIN) vaginal cream 1/2 gram vaginally every night x 1 week, then twice weekly at bedtime 90 g 3  . cromolyn (OPTICROM) 4 % ophthalmic solution 1 DROP IN BOTH EYES FOUR TIMES A DAY  12  . cyanocobalamin (,VITAMIN B-12,) 1000 MCG/ML injection ADMINISTER 1 CC SUBQ EVERY 4 WEEKS AS DIRECTED 10 mL 1  . DPH-Lido-AlHydr-MgHydr-Simeth (FIRST-MOUTHWASH BLM) SUSP TAKE 5 MLS BY MOUTH 3 TIMES A DAY AS NEEDED FOR MOUTH PAIN  0  . folic acid (FOLVITE) 1 MG tablet Take 1 mg by mouth daily. For restless legs    . gabapentin (NEURONTIN) 300 MG capsule TAKE ONE CAPSULE BY MOUTH EVERY DAY 90 capsule 2  . hydrochlorothiazide (MICROZIDE) 12.5 MG capsule TAKE ONE CAPSULE BY MOUTH EVERY DAY FOR BLOOD PRESSURE 90 capsule 2  . Insulin Syringe-Needle U-100 (B-D INS  SYRINGE 0.5CC/30GX1/2") 30G X 1/2" 0.5 ML MISC by Does not apply route. To administer B12 injections     . KLOR-CON M10 10 MEQ tablet TAKE 1 TABLET (10 MEQ TOTAL) BY MOUTH DAILY. 90 tablet 3  . latanoprost (XALATAN) 0.005 % ophthalmic solution Place 1 drop into both eyes at bedtime. 2.5 mL 5  . loratadine (CLARITIN) 10 MG tablet Take 1 tablet (10 mg total) by mouth daily. 90 tablet 3  . pantoprazole (PROTONIX) 40 MG tablet Take 1 tablet (40 mg total) by mouth daily. 90 tablet 3  . simvastatin (ZOCOR) 20 MG tablet TAKE 1 TABLET (20 MG TOTAL) BY MOUTH DAILY. (Patient taking differently: 10 mg by mouth daily) 90 tablet 2  . magic mouthwash w/lidocaine SOLN Take 5 mLs by mouth 3 (three) times daily as needed for mouth pain. 120 mL 0  . amLODipine (NORVASC) 5 MG tablet TAKE 1 TABLET BY MOUTH DAILY 90 tablet 2   No facility-administered medications prior to visit.     ROS See HPI  Objective:  BP 128/78 (BP Location: Left Arm, Patient Position: Sitting, Cuff Size: Normal)   Pulse 88   Temp 97.6 F (36.4 C)   Ht 5' 2.5" (1.588 m)   Wt 143 lb (64.9 kg)   SpO2 95%   BMI 25.74 kg/m  BP Readings from Last 3 Encounters:  05/04/16 128/78  05/02/16 (!) 110/55  04/04/16 126/66    Wt Readings from Last 3 Encounters:  05/04/16 143 lb (64.9 kg)  03/25/16 141 lb 4 oz (64.1 kg)  03/15/16 140 lb (63.5 kg)    Physical Exam  Constitutional: She is oriented to person, place, and time. No distress.  HENT:  Right Ear: Tympanic membrane, external ear and ear canal normal.  Left Ear: Tympanic membrane, external ear and ear canal normal.  Nose: Mucosal edema present. No rhinorrhea or sinus tenderness. Right sinus exhibits no maxillary sinus tenderness and no frontal sinus tenderness. Left sinus exhibits no maxillary sinus tenderness and no frontal sinus tenderness.  Mouth/Throat: Uvula is midline and mucous membranes are normal. Posterior oropharyngeal erythema present. No oropharyngeal exudate or  posterior oropharyngeal edema.  Eyes: No scleral icterus.  Neck: Normal range of motion. Neck supple.  Cardiovascular: Normal rate and normal heart sounds.   Pulmonary/Chest: Effort normal and breath sounds normal.  Lymphadenopathy:    She has no cervical adenopathy.  Neurological: She is alert and oriented to person, place, and time.  Skin: Skin is warm and dry.  Vitals reviewed.   Lab Results  Component Value Date   WBC 5.9 05/02/2016   HGB 13.9 05/02/2016   HCT 42.1 05/02/2016   PLT 138 (L) 05/02/2016   GLUCOSE 100 (H) 03/03/2016   CHOL 191 10/01/2014   TRIG 72.0 10/01/2014   HDL 57.80 10/01/2014   LDLCALC 119 (H) 10/01/2014   ALT 14 10/19/2015   AST 18 10/19/2015   NA 135 03/03/2016   K 4.4 03/03/2016   CL 97 03/03/2016   CREATININE 0.97 03/03/2016   BUN 19 03/03/2016   CO2 32 03/03/2016   TSH 3.38 12/03/2015   INR 1.0 10/07/2008   HGBA1C  10/07/2008    5.9 (NOTE)   The ADA recommends the following therapeutic goal for glycemic   control related to Hgb A1C measurement:   Goal of Therapy:   < 7.0% Hgb A1C   Reference: American Diabetes Association: Clinical Practice   Recommendations 2008, Diabetes Care,  2008, 31:(Suppl 1).   MICROALBUR 0.4 07/14/2009    Ct Head Wo Contrast  Result Date: 03/10/2016 CLINICAL DATA:  Persistent headache after fall several weeks prior EXAM: CT HEAD WITHOUT CONTRAST TECHNIQUE: Contiguous axial images were obtained from the base of the skull through the vertex without intravenous contrast. COMPARISON:  Head CT October 07, 2008 and brain MRI October 07, 2008 FINDINGS: Brain: Mild diffuse atrophy is stable. There is no intracranial mass, hemorrhage, extra-axial fluid collection, or midline shift. There is patchy small vessel disease in the centra semiovale bilaterally, stable. There is no new gray-white compartment lesion. No acute infarct is evident. Vascular: There is extensive calcification in the distal vertebral arteries bilaterally. There is  bilateral carotid siphon calcification as well. There is no hyperdense vessel. Skull: The bony calvarium appears intact. Sinuses/Orbits: There is an air-fluid level in the right sphenoid sinus. Other visualized paranasal sinuses are clear. Visualized orbits appear symmetric bilaterally. Other: Visualized mastoid air cells are clear. IMPRESSION: Mild atrophy with stable periventricular small vessel disease. No intracranial mass, hemorrhage, or extra-axial fluid collection. No acute appearing infarct. There is an air-fluid level in the right sphenoid sinus consistent with acute sinusitis. There are multiple foci of arterial vascular calcification. Electronically Signed   By: Lowella Grip III M.D.   On: 03/10/2016 15:06    Assessment & Plan:   Stanton Kidney  was seen today for oral swelling.  Diagnoses and all orders for this visit:  Burning tongue -     Diphenhyd-Hydrocort-Nystatin (FIRST-DUKES MOUTHWASH) SUSP; Use as directed 5 mLs in the mouth or throat 3 (three) times daily before meals.  Need for prophylactic vaccination and inoculation against influenza -     Flu vaccine HIGH DOSE PF  Nasal sinus congestion -     fluticasone (FLONASE) 50 MCG/ACT nasal spray; Place 2 sprays into both nostrils daily. -     cetirizine (ZYRTEC) 10 MG tablet; Take 1 tablet (10 mg total) by mouth daily.   I have discontinued Ms. Khalid's magic mouthwash w/lidocaine. I am also having her start on FIRST-DUKES MOUTHWASH, fluticasone, and cetirizine. Additionally, I am having her maintain her Insulin Syringe-Needle U-100, aspirin, acetaminophen, Cholecalciferol, B-D INS SYR ULTRAFINE 123XX123, folic acid, B-D 3CC LUER-LOK SYR 25GX1", amLODipine, hydrochlorothiazide, cyanocobalamin, loratadine, pantoprazole, conjugated estrogens, simvastatin, latanoprost, KLOR-CON M10, gabapentin, FIRST-MOUTHWASH BLM, and cromolyn.  Meds ordered this encounter  Medications  . Diphenhyd-Hydrocort-Nystatin (FIRST-DUKES MOUTHWASH) SUSP     Sig: Use as directed 5 mLs in the mouth or throat 3 (three) times daily before meals.    Dispense:  120 mL    Refill:  0    Order Specific Question:   Supervising Provider    Answer:   Cassandria Anger [[id number]]  . fluticasone (FLONASE) 50 MCG/ACT nasal spray    Sig: Place 2 sprays into both nostrils daily.    Dispense:  16 g    Refill:  0    Order Specific Question:   Supervising Provider    Answer:   Cassandria Anger [[id number]]  . cetirizine (ZYRTEC) 10 MG tablet    Sig: Take 1 tablet (10 mg total) by mouth daily.    Dispense:  14 tablet    Refill:  0    Order Specific Question:   Supervising Provider    Answer:   Cassandria Anger [[id number]]    Follow-up: Return if symptoms worsen or fail to improve.  Wilfred Lacy, NP

## 2016-05-04 NOTE — Progress Notes (Signed)
Pre visit review using our clinic review tool, if applicable. No additional management support is needed unless otherwise documented below in the visit note. 

## 2016-05-04 NOTE — Telephone Encounter (Signed)
Patient is requesting call back in regard to fluticasone.  Patient was reading not to take if you have certain conditions and patient states she has 3 conditions listed.

## 2016-05-08 ENCOUNTER — Telehealth: Payer: Self-pay | Admitting: Pharmacist

## 2016-05-08 NOTE — Telephone Encounter (Signed)
Pt contacted pharmacist on 05/05/16 stating she has had burning tongue and under partial denture for > 1 week.  No white, patchy areas in her mouth.  No recent steroid inhaler or nasal steroid sprays.  No new meds.  No fever. She has already seen PA at Dr. Judeen Hammans office and rx'd [employer].  She did not fill the Rx due to cost. Pharmacist advised pt on 05/05/16 to try Oragel and Salt water/Baking soda rinse every 3 hrs.   Today (05/08/16) pt contacted pharmacist again and now has "gray area" on back of tongue and some on her tonsil.  She stated she tried Oragel and it burned.  She also tried Biotene mouth wash and it burned.  The only thing that does not burn is the baking soda/salt rinse. Pt advised to contact her dentist since she is not able to go to Dr. Judeen Hammans office today/tomorrow (Dr. Alain Marion is on vacation per pt anyway). Pt contacted pharmacist again and informed she did contact her dentist and rx'd a rinse for oral thrush (?nystatin). Comments forwarded to Dr. Benay Spice for review/FYI since pt has CLL and is on monthly IVIG at [facility] (last given 05/02/16) without difficulties. Kennith Center, Pharm.D., CPP 05/08/2016@2 :49 PM

## 2016-05-09 ENCOUNTER — Other Ambulatory Visit (HOSPITAL_COMMUNITY): Payer: Self-pay

## 2016-05-09 ENCOUNTER — Emergency Department (HOSPITAL_COMMUNITY): Payer: Medicare Other

## 2016-05-09 ENCOUNTER — Other Ambulatory Visit: Payer: Self-pay

## 2016-05-09 ENCOUNTER — Observation Stay (HOSPITAL_COMMUNITY)
Admission: EM | Admit: 2016-05-09 | Discharge: 2016-05-11 | Disposition: A | Discharge status: Skilled Nursing Facility | Payer: Medicare Other | Attending: Internal Medicine | Admitting: Internal Medicine

## 2016-05-09 DIAGNOSIS — G629 Polyneuropathy, unspecified: Secondary | ICD-10-CM | POA: Diagnosis not present

## 2016-05-09 DIAGNOSIS — E785 Hyperlipidemia, unspecified: Secondary | ICD-10-CM | POA: Insufficient documentation

## 2016-05-09 DIAGNOSIS — S42291A Other displaced fracture of upper end of right humerus, initial encounter for closed fracture: Secondary | ICD-10-CM | POA: Diagnosis present

## 2016-05-09 DIAGNOSIS — N390 Urinary tract infection, site not specified: Secondary | ICD-10-CM | POA: Diagnosis not present

## 2016-05-09 DIAGNOSIS — K1379 Other lesions of oral mucosa: Secondary | ICD-10-CM | POA: Diagnosis not present

## 2016-05-09 DIAGNOSIS — E860 Dehydration: Secondary | ICD-10-CM | POA: Diagnosis present

## 2016-05-09 DIAGNOSIS — R55 Syncope and collapse: Secondary | ICD-10-CM | POA: Diagnosis not present

## 2016-05-09 DIAGNOSIS — Z7982 Long term (current) use of aspirin: Secondary | ICD-10-CM | POA: Diagnosis not present

## 2016-05-09 DIAGNOSIS — W010XXA Fall on same level from slipping, tripping and stumbling without subsequent striking against object, initial encounter: Secondary | ICD-10-CM | POA: Diagnosis not present

## 2016-05-09 DIAGNOSIS — R531 Weakness: Secondary | ICD-10-CM | POA: Insufficient documentation

## 2016-05-09 DIAGNOSIS — C9110 Chronic lymphocytic leukemia of B-cell type not having achieved remission: Secondary | ICD-10-CM | POA: Insufficient documentation

## 2016-05-09 DIAGNOSIS — S42411A Displaced simple supracondylar fracture without intercondylar fracture of right humerus, initial encounter for closed fracture: Secondary | ICD-10-CM

## 2016-05-09 DIAGNOSIS — M1990 Unspecified osteoarthritis, unspecified site: Secondary | ICD-10-CM | POA: Diagnosis not present

## 2016-05-09 DIAGNOSIS — W19XXXA Unspecified fall, initial encounter: Secondary | ICD-10-CM

## 2016-05-09 DIAGNOSIS — I10 Essential (primary) hypertension: Secondary | ICD-10-CM | POA: Diagnosis not present

## 2016-05-09 DIAGNOSIS — Z23 Encounter for immunization: Secondary | ICD-10-CM | POA: Diagnosis not present

## 2016-05-09 DIAGNOSIS — Z79899 Other long term (current) drug therapy: Secondary | ICD-10-CM | POA: Insufficient documentation

## 2016-05-09 LAB — COMPREHENSIVE METABOLIC PANEL
ALT: 19 U/L (ref 14–54)
ANION GAP: 7 (ref 5–15)
AST: 23 U/L (ref 15–41)
Albumin: 3.8 g/dL (ref 3.5–5.0)
Alkaline Phosphatase: 43 U/L (ref 38–126)
BILIRUBIN TOTAL: 0.9 mg/dL (ref 0.3–1.2)
BUN: 19 mg/dL (ref 6–20)
CO2: 27 mmol/L (ref 22–32)
Calcium: 8.9 mg/dL (ref 8.9–10.3)
Chloride: 100 mmol/L — ABNORMAL LOW (ref 101–111)
Creatinine, Ser: 0.81 mg/dL (ref 0.44–1.00)
Glucose, Bld: 152 mg/dL — ABNORMAL HIGH (ref 65–99)
POTASSIUM: 3.6 mmol/L (ref 3.5–5.1)
Sodium: 134 mmol/L — ABNORMAL LOW (ref 135–145)
TOTAL PROTEIN: 7.4 g/dL (ref 6.5–8.1)

## 2016-05-09 LAB — URINE MICROSCOPIC-ADD ON

## 2016-05-09 LAB — I-STAT TROPONIN, ED: TROPONIN I, POC: 0 ng/mL (ref 0.00–0.08)

## 2016-05-09 LAB — CBC WITH DIFFERENTIAL/PLATELET
BASOS PCT: 0 %
Basophils Absolute: 0 10*3/uL (ref 0.0–0.1)
EOS PCT: 0 %
Eosinophils Absolute: 0 10*3/uL (ref 0.0–0.7)
HEMATOCRIT: 37.8 % (ref 36.0–46.0)
Hemoglobin: 13 g/dL (ref 12.0–15.0)
LYMPHS PCT: 20 %
Lymphs Abs: 1.9 10*3/uL (ref 0.7–4.0)
MCH: 30.9 pg (ref 26.0–34.0)
MCHC: 34.4 g/dL (ref 30.0–36.0)
MCV: 89.8 fL (ref 78.0–100.0)
MONO ABS: 0.7 10*3/uL (ref 0.1–1.0)
MONOS PCT: 7 %
Neutro Abs: 7 10*3/uL (ref 1.7–7.7)
Neutrophils Relative %: 73 %
Platelets: 143 10*3/uL — ABNORMAL LOW (ref 150–400)
RBC: 4.21 MIL/uL (ref 3.87–5.11)
RDW: 14.1 % (ref 11.5–15.5)
WBC: 9.5 10*3/uL (ref 4.0–10.5)

## 2016-05-09 LAB — URINALYSIS, ROUTINE W REFLEX MICROSCOPIC
BILIRUBIN URINE: NEGATIVE
Glucose, UA: NEGATIVE mg/dL
Hgb urine dipstick: NEGATIVE
KETONES UR: 15 mg/dL — AB
NITRITE: POSITIVE — AB
PH: 6 (ref 5.0–8.0)
Protein, ur: NEGATIVE mg/dL
Specific Gravity, Urine: 1.02 (ref 1.005–1.030)

## 2016-05-09 MED ORDER — MORPHINE SULFATE (PF) 4 MG/ML IV SOLN: 4.0000 mg | Freq: Once | INTRAVENOUS | Status: DC

## 2016-05-09 MED ORDER — FENTANYL CITRATE (PF) 100 MCG/2ML IJ SOLN
100.0000 ug | Freq: Once | INTRAMUSCULAR | Status: AC
  Administered 2016-05-09: 100 ug via INTRAVENOUS
  Filled 2016-05-09: qty 2

## 2016-05-09 MED ORDER — MAGIC MOUTHWASH
5.0000 mL | Freq: Once | ORAL | Status: AC
  Administered 2016-05-09: 5 mL via ORAL
  Filled 2016-05-09: qty 5

## 2016-05-09 NOTE — ED Notes (Addendum)
Pt reports hitting the back of head when the fall occurred.

## 2016-05-09 NOTE — ED Triage Notes (Addendum)
Per EMS, pt is from home with complaints of a fall with no LOC. Per EMS pt has a right shoulder injury/deformity and rates pain 7/10. EMS reports good capillary refill and good pulses in extremity. Pt was spotted by neighbor on the ground and the neighbor called EMS. No other complaints or injuries a part from the shoulder injury. Pt is AOX4. Pt received a total of 100mg  of fentanyl from EMS and last pain assessment was 7/10. Pt currently being treated for Leukemia at [facility].

## 2016-05-09 NOTE — ED Provider Notes (Signed)
[facility] DEPT Provider Note   CSN: [id number] Arrival date & time: 05/09/16  1931   By signing my name below, I, Soijett Blue, attest that this documentation has been prepared under the direction and in the presence of Charlann Lange, PA-C Electronically Signed: Soijett Blue, ED Scribe. 05/09/16. 9:15 PM.   History   Chief Complaint Chief Complaint  Patient presents with  . Shoulder Injury  . Fall    HPI [name] is a 82 y.o. female with a PMHx of osteoarthritis, HTN, who presents to the Emergency Department brought in by EMS complaining of right shoulder injury s/p fall occurring PTA. Pt notes that she was ambulating up steps when she lost balance and fell flat onto her back. She cannot say that she did not pass out before or during fall. Pt reports that her neighbor called EMS after finding the pt on the ground. Pt notes that her right shoulder pain radiates to her right upper arm. Pt states that she fractured her right shoulder 1-2 years ago and went to physical therapy without surgical repair and she was seen at [facility]. Pt reports that she is being treated for her CLL by Dr. Benay Spice with an infusion every 4 weeks but denies use of chemotherapy. Pt notes that she lives by herself. Pt is having associated symptoms of right shoulder swelling, right upper arm pain, and hitting her head on the ground. Per EMS, pt was given 100 mg of fentanyl for the relief of her symptoms. She denies LE pain, abdominal pain, CP, SOB, neck pain, and any other symptoms. Pt denies taking daily blood thinners.    The history is provided by the patient. No language interpreter was used.    Past Medical History:  Diagnosis Date  . Allergy    rhinitis  . Chronic ethmoidal sinusitis   . Diverticulosis of colon   . Glaucoma   . Hx of colonic polyps   . Hyperlipidemia   . Hypertension   . Hypogammaglobulinemia ([place])    Monthly IVIG  . Leukemia ([place])    Chronic lymphocytic  leukemia  . Osteoarthritis   . Peripheral neuropathy ([place])   . Pernicious anemia   . UTI (lower urinary tract infection) 2010  . Vitamin B12 deficiency     Patient Active Problem List   Diagnosis Date Noted  . Burning tongue 03/25/2016  . Post concussion syndrome 03/03/2016  . Fall against object 03/03/2016  . Cystocele 03/05/2015  . Dehydration 02/08/2015  . Hyponatremia 02/08/2015  . Well adult exam 10/01/2014  . Grief at loss of child 12/11/2013  . Irregular heart beat 09/29/2013  . Humeral head fracture 08/26/2013  . Actinic keratoses 08/26/2013  . Carotid bruit 05/02/2012  . Anxiety 03/17/2011  . ANEMIA, PERNICIOUS, HX OF 10/10/2007  . PERIPHERAL NEUROPATHY 09/04/2007  . Chronic fatigue 08/06/2007  . Chronic lymphocytic leukemia ([place]) 06/17/2007  . ALLERGIC RHINITIS 06/17/2007  . OSTEOARTHRITIS 06/17/2007  . B12 deficiency 06/07/2007  . GASTROESOPHAGEAL REFLUX DISEASE 04/30/2007  . Dyslipidemia 12/19/2006  . Essential hypertension 12/19/2006  . DIVERTICULOSIS, COLON 05/19/2003  . COLONIC POLYPS, HX OF 01/10/2000    Past Surgical History:  Procedure Laterality Date  . ABDOMINAL HYSTERECTOMY  1987  . bilateral cataracts  2007  . bladder tack  1987  . L4-L5 Laminetomy  1999  . TUBAL LIGATION    . tubaligation  1961  . ureter reconstruction  1992    OB History    Gravida Para Term Preterm AB  Living   4 4       4    SAB TAB Ectopic Multiple Live Births                   Home Medications    Prior to Admission medications   Medication Sig Start Date End Date Taking? Authorizing Provider  acetaminophen (TYLENOL) 500 MG tablet Take 500 mg by mouth 2 (two) times daily.    Yes Historical Provider, MD  amLODipine (NORVASC) 5 MG tablet TAKE 1 TABLET (5 MG TOTAL) BY MOUTH DAILY. 02/08/15  Yes Evie Lacks Plotnikov, MD  aspirin 81 MG EC tablet Take 81 mg by mouth daily.     Yes Historical Provider, MD  cetirizine (ZYRTEC) 10 MG tablet Take 1 tablet (10 mg total) by  mouth daily. 05/04/16  Yes Charlene Brooke Nche, NP  Cholecalciferol (EQL VITAMIN D3) 1000 UNITS tablet Take 1,000 Units by mouth daily.     Yes Historical Provider, MD  conjugated estrogens (PREMARIN) vaginal cream 1/2 gram vaginally every night x 1 week, then twice weekly at bedtime 12/29/15  Yes Salvadore Dom, MD  cromolyn (OPTICROM) 4 % ophthalmic solution 1 DROP IN BOTH EYES FOUR TIMES A DAY 03/21/16  Yes Historical Provider, MD  cyanocobalamin (,VITAMIN B-12,) 1000 MCG/ML injection ADMINISTER 1 CC SUBQ EVERY 4 WEEKS AS DIRECTED 07/08/15  Yes Cassandria Anger, MD  Diphenhyd-Hydrocort-Nystatin (FIRST-DUKES MOUTHWASH) SUSP Use as directed 5 mLs in the mouth or throat 3 (three) times daily before meals. 05/04/16 05/11/16 Yes Charlene Brooke Nche, NP  fluticasone (FLONASE) 50 MCG/ACT nasal spray Place 2 sprays into both nostrils daily. 05/04/16  Yes Charlene Brooke Nche, NP  folic acid (FOLVITE) 1 MG tablet Take 1 mg by mouth daily. For restless legs   Yes Historical Provider, MD  gabapentin (NEURONTIN) 300 MG capsule TAKE ONE CAPSULE BY MOUTH EVERY DAY 04/14/16  Yes Evie Lacks Plotnikov, MD  hydrochlorothiazide (MICROZIDE) 12.5 MG capsule TAKE ONE CAPSULE BY MOUTH EVERY DAY FOR BLOOD PRESSURE 07/08/15  Yes Evie Lacks Plotnikov, MD  KLOR-CON M10 10 MEQ tablet TAKE 1 TABLET (10 MEQ TOTAL) BY MOUTH DAILY. 03/06/16  Yes Evie Lacks Plotnikov, MD  latanoprost (XALATAN) 0.005 % ophthalmic solution Place 1 drop into both eyes at bedtime. 03/03/16  Yes Evie Lacks Plotnikov, MD  loratadine (CLARITIN) 10 MG tablet Take 1 tablet (10 mg total) by mouth daily. 07/09/15  Yes Evie Lacks Plotnikov, MD  pantoprazole (PROTONIX) 40 MG tablet Take 1 tablet (40 mg total) by mouth daily. 08/05/15  Yes Evie Lacks Plotnikov, MD  simvastatin (ZOCOR) 20 MG tablet TAKE 1 TABLET (20 MG TOTAL) BY MOUTH DAILY. Patient taking differently: TAKE 1 TABLET (10 MG TOTAL) BY MOUTH DAILY. 01/31/16  Yes Evie Lacks Plotnikov, MD  B-D 3CC LUER-LOK SYR  25GX1" 25G X 1" 3 ML MISC USE TO ADMINISTER B12 INJECTIONS 09/08/13   Cassandria Anger, MD  B-D INS SYR ULTRAFINE 1CC/30G 30G X 1/2" 1 ML MISC USE TO ADMINISTER B12 INJECTIONS 04/25/12   Evie Lacks Plotnikov, MD  Insulin Syringe-Needle U-100 (B-D INS SYRINGE 0.5CC/30GX1/2") 30G X 1/2" 0.5 ML MISC by Does not apply route. To administer B12 injections     Historical Provider, MD    Family History Family History  Problem Relation Age of Onset  . Jaundice Father   . Stroke Son 60    in NH  . Cancer Mother     uterine  . Diabetes Mother   . Other Sister  brain tumor  . Cancer Sister   . Hypertension Other   . Cancer Other     breast  . Thyroid disease Sister   . Cancer Sister     thyroid cancer    Social History Social History  Substance Use Topics  . Smoking status: Never Smoker  . Smokeless tobacco: Never Used  . Alcohol use No     Allergies   Pneumococcal vaccines; Clarithromycin; Hctz [hydrochlorothiazide]; Oxycodone-aspirin; and Propoxyphene n-acetaminophen   Review of Systems Review of Systems  Constitutional: Negative for chills and diaphoresis.  Eyes: Negative for visual disturbance.  Respiratory: Negative for shortness of breath.   Cardiovascular: Negative for chest pain.  Gastrointestinal: Negative for abdominal pain and nausea.  Musculoskeletal: Positive for arthralgias and joint swelling (right shoulder). Negative for neck pain.  Skin: Negative for color change, rash and wound.  Neurological: Positive for syncope (Unclear) and weakness. Negative for dizziness and light-headedness.     Physical Exam Updated Vital Signs BP 143/62 (BP Location: Left Arm)   Pulse 66   Temp 97.7 F (36.5 C) (Oral)   Resp 17   Ht 5\' 4"  (1.626 m)   Wt 140 lb (63.5 kg)   SpO2 98%   BMI 24.03 kg/m   Physical Exam  Constitutional: She is oriented to person, place, and time. She appears well-developed and well-nourished. No distress.  HENT:  Head: Normocephalic and  atraumatic.  Eyes: EOM are normal.  Neck: Neck supple.  Cardiovascular: Normal rate, regular rhythm and intact distal pulses.   No murmur heard. Pulmonary/Chest: Effort normal. No respiratory distress. She has no wheezes. She has no rales. She exhibits no tenderness.  Abdominal: Soft. She exhibits no distension. There is no tenderness.  Musculoskeletal: Normal range of motion.  No midline or paracervical tenderness. Full, pain-free range of motion of neck and all extremities with exception of right UE. There is significant swelling of the right glenohumeral area with tenderness that extends to mid-humerus. Grip strength is full.   Neurological: She is alert and oriented to person, place, and time. Coordination normal.  Patient A&O x3. Follows command. No facial asymmetry, speech abnormalities or coordination deficits.   Skin: Skin is warm and dry.  Psychiatric: She has a normal mood and affect. Her behavior is normal.  Nursing note and vitals reviewed.    ED Treatments / Results  DIAGNOSTIC STUDIES: Oxygen Saturation is 98% on RA, nl by my interpretation.    COORDINATION OF CARE: 8:15 PM Discussed treatment plan with pt at bedside which includes right humerus xray, right shoulder xray, ice, and pt agreed to plan.   Labs (all labs ordered are listed, but only abnormal results are displayed) Labs Reviewed  CBC WITH DIFFERENTIAL/PLATELET - Abnormal; Notable for the following:       Result Value   Platelets 143 (*)    All other components within normal limits  COMPREHENSIVE METABOLIC PANEL - Abnormal; Notable for the following:    Sodium 134 (*)    Chloride 100 (*)    Glucose, Bld 152 (*)    All other components within normal limits  URINALYSIS, ROUTINE W REFLEX MICROSCOPIC (NOT AT [facility]) - Abnormal; Notable for the following:    Color, Urine ORANGE (*)    APPearance CLOUDY (*)    Ketones, ur 15 (*)    Nitrite POSITIVE (*)    Leukocytes, UA SMALL (*)    All other components  within normal limits  URINE MICROSCOPIC-ADD ON - Abnormal; Notable for the  following:    Squamous Epithelial / LPF 0-5 (*)    Bacteria, UA FEW (*)    All other components within normal limits  URINE CULTURE  I-STAT TROPOININ, ED   Results for orders placed or performed during the hospital encounter of 05/09/16  CBC with Differential/Platelet  Result Value Ref Range   WBC 9.5 4.0 - 10.5 K/uL   RBC 4.21 3.87 - 5.11 MIL/uL   Hemoglobin 13.0 12.0 - 15.0 g/dL   HCT 37.8 36.0 - 46.0 %   MCV 89.8 78.0 - 100.0 fL   MCH 30.9 26.0 - 34.0 pg   MCHC 34.4 30.0 - 36.0 g/dL   RDW 14.1 11.5 - 15.5 %   Platelets 143 (L) 150 - 400 K/uL   Neutrophils Relative % 73 %   Neutro Abs 7.0 1.7 - 7.7 K/uL   Lymphocytes Relative 20 %   Lymphs Abs 1.9 0.7 - 4.0 K/uL   Monocytes Relative 7 %   Monocytes Absolute 0.7 0.1 - 1.0 K/uL   Eosinophils Relative 0 %   Eosinophils Absolute 0.0 0.0 - 0.7 K/uL   Basophils Relative 0 %   Basophils Absolute 0.0 0.0 - 0.1 K/uL  Comprehensive metabolic panel  Result Value Ref Range   Sodium 134 (L) 135 - 145 mmol/L   Potassium 3.6 3.5 - 5.1 mmol/L   Chloride 100 (L) 101 - 111 mmol/L   CO2 27 22 - 32 mmol/L   Glucose, Bld 152 (H) 65 - 99 mg/dL   BUN 19 6 - 20 mg/dL   Creatinine, Ser 0.81 0.44 - 1.00 mg/dL   Calcium 8.9 8.9 - 10.3 mg/dL   Total Protein 7.4 6.5 - 8.1 g/dL   Albumin 3.8 3.5 - 5.0 g/dL   AST 23 15 - 41 U/L   ALT 19 14 - 54 U/L   Alkaline Phosphatase 43 38 - 126 U/L   Total Bilirubin 0.9 0.3 - 1.2 mg/dL   GFR calc non Af Amer >60 >60 mL/min   GFR calc Af Amer >60 >60 mL/min   Anion gap 7 5 - 15  Urinalysis, Routine w reflex microscopic (not at [facility])  Result Value Ref Range   Color, Urine ORANGE (A) YELLOW   APPearance CLOUDY (A) CLEAR   Specific Gravity, Urine 1.020 1.005 - 1.030   pH 6.0 5.0 - 8.0   Glucose, UA NEGATIVE NEGATIVE mg/dL   Hgb urine dipstick NEGATIVE NEGATIVE   Bilirubin Urine NEGATIVE NEGATIVE   Ketones, ur 15 (A) NEGATIVE  mg/dL   Protein, ur NEGATIVE NEGATIVE mg/dL   Nitrite POSITIVE (A) NEGATIVE   Leukocytes, UA SMALL (A) NEGATIVE  Urine microscopic-add on  Result Value Ref Range   Squamous Epithelial / LPF 0-5 (A) NONE SEEN   WBC, UA 6-30 0 - 5 WBC/hpf   RBC / HPF 0-5 0 - 5 RBC/hpf   Bacteria, UA FEW (A) NONE SEEN  I-stat troponin, ED  Result Value Ref Range   Troponin i, poc 0.00 0.00 - 0.08 ng/mL   Comment 3           Dg Chest 2 View  Result Date: 05/09/2016 CLINICAL DATA:  Right anterior chest pain after falling today. EXAM: CHEST  2 VIEW COMPARISON:  02/07/2015 FINDINGS: Heart size is normal. There is atherosclerosis of the aorta. The lungs are clear except for a questionable 1 cm nodule in the right upper lobe. This should be evaluated when able. No effusions or heart failure. There is an acute  fracture the proximal right humerus. IMPRESSION: Acute fracture proximal right humerus. Question 1 cm nodule right upper lobe. Followup with repeat radiography and/or CT. Aortic atherosclerosis. Electronically Signed   By: Nelson Chimes M.D.   On: 05/09/2016 22:43   Dg Shoulder Right  Result Date: 05/09/2016 CLINICAL DATA:  Right shoulder and humerus pain after fall EXAM: RIGHT SHOULDER - 2+ VIEW; RIGHT HUMERUS - 2+ VIEW COMPARISON:  02/07/2015 chest radiograph, 06/29/2013 right shoulder radiographs FINDINGS: There is an acute on chronic right surgical neck fracture with one shaft width anterior displacement of the humeral shaft relative to the humeral head. The humeral head maintains its articulation with the glenoid. The acromioclavicular joint and sternoclavicular joint are intact. There is platelike atelectasis and/or scarring in the right upper lobe. No adjacent rib fracture is identified. The scapula appears intact. IMPRESSION: Acute on chronic right surgical neck fracture of the humerus with anterior displacement of the distal fracture fragment one shaft width. Electronically Signed   By: Ashley Royalty M.D.    On: 05/09/2016 20:57   Ct Head Wo Contrast  Result Date: 05/09/2016 CLINICAL DATA:  [place] today.  Arm injury. EXAM: CT HEAD WITHOUT CONTRAST TECHNIQUE: Contiguous axial images were obtained from the base of the skull through the vertex without intravenous contrast. COMPARISON:  03/10/2016 FINDINGS: Brain: There is not accelerated atrophy for age. There are mild chronic small-vessel changes of the white matter. No sign of acute infarction, mass lesion, hemorrhage, hydrocephalus or extra-axial collection. Vascular: There is atherosclerotic calcification of the major vessels at the base of the brain. Skull: No skull fracture Sinuses/Orbits: Chronic inflammatory changes in the right division of the sphenoid sinus. Other sinuses are clear. No orbital abnormality seen. Other: None IMPRESSION: No acute intracranial finding. Mild age related atrophy and chronic small vessel change, less than often seen at this age. Chronic inflammatory changes of the right division of the sphenoid sinus. Electronically Signed   By: Nelson Chimes M.D.   On: 05/09/2016 22:45   Ct Cervical Spine Wo Contrast  Result Date: 05/09/2016 CLINICAL DATA:  [place] today EXAM: CT CERVICAL SPINE WITHOUT CONTRAST TECHNIQUE: Multidetector CT imaging of the cervical spine was performed without intravenous contrast. Multiplanar CT image reconstructions were also generated. COMPARISON:  None. FINDINGS: Alignment: Slight straightening of the normal lordosis. No traumatic malalignment. Skull base and vertebrae: No fracture or primary bone lesion. Soft tissues and spinal canal: No soft tissue injury. Disc levels: Degenerative spondylosis from C3-4 through C6-7. Mild osteophytic encroachment upon the foramina. Facet osteoarthritis from C2-3 through C4-5. Upper chest: Normal. The scan does not quite reach the area of concern at plain radiography. Other: None IMPRESSION: No acute or traumatic finding. Chronic degenerative spondylosis and facet osteoarthritis.  Electronically Signed   By: Nelson Chimes M.D.   On: 05/09/2016 22:48   Dg Humerus Right  Result Date: 05/09/2016 CLINICAL DATA:  Right shoulder and humerus pain after fall EXAM: RIGHT SHOULDER - 2+ VIEW; RIGHT HUMERUS - 2+ VIEW COMPARISON:  02/07/2015 chest radiograph, 06/29/2013 right shoulder radiographs FINDINGS: There is an acute on chronic right surgical neck fracture with one shaft width anterior displacement of the humeral shaft relative to the humeral head. The humeral head maintains its articulation with the glenoid. The acromioclavicular joint and sternoclavicular joint are intact. There is platelike atelectasis and/or scarring in the right upper lobe. No adjacent rib fracture is identified. The scapula appears intact. IMPRESSION: Acute on chronic right surgical neck fracture of the humerus with anterior displacement of  the distal fracture fragment one shaft width. Electronically Signed   By: Ashley Royalty M.D.   On: 05/09/2016 20:57    EKG  EKG Interpretation None       Radiology Dg Chest 2 View  Result Date: 05/09/2016 CLINICAL DATA:  Right anterior chest pain after falling today. EXAM: CHEST  2 VIEW COMPARISON:  02/07/2015 FINDINGS: Heart size is normal. There is atherosclerosis of the aorta. The lungs are clear except for a questionable 1 cm nodule in the right upper lobe. This should be evaluated when able. No effusions or heart failure. There is an acute fracture the proximal right humerus. IMPRESSION: Acute fracture proximal right humerus. Question 1 cm nodule right upper lobe. Followup with repeat radiography and/or CT. Aortic atherosclerosis. Electronically Signed   By: Nelson Chimes M.D.   On: 05/09/2016 22:43   Dg Shoulder Right  Result Date: 05/09/2016 CLINICAL DATA:  Right shoulder and humerus pain after fall EXAM: RIGHT SHOULDER - 2+ VIEW; RIGHT HUMERUS - 2+ VIEW COMPARISON:  02/07/2015 chest radiograph, 06/29/2013 right shoulder radiographs FINDINGS: There is an acute on  chronic right surgical neck fracture with one shaft width anterior displacement of the humeral shaft relative to the humeral head. The humeral head maintains its articulation with the glenoid. The acromioclavicular joint and sternoclavicular joint are intact. There is platelike atelectasis and/or scarring in the right upper lobe. No adjacent rib fracture is identified. The scapula appears intact. IMPRESSION: Acute on chronic right surgical neck fracture of the humerus with anterior displacement of the distal fracture fragment one shaft width. Electronically Signed   By: Ashley Royalty M.D.   On: 05/09/2016 20:57   Ct Head Wo Contrast  Result Date: 05/09/2016 CLINICAL DATA:  [place] today.  Arm injury. EXAM: CT HEAD WITHOUT CONTRAST TECHNIQUE: Contiguous axial images were obtained from the base of the skull through the vertex without intravenous contrast. COMPARISON:  03/10/2016 FINDINGS: Brain: There is not accelerated atrophy for age. There are mild chronic small-vessel changes of the white matter. No sign of acute infarction, mass lesion, hemorrhage, hydrocephalus or extra-axial collection. Vascular: There is atherosclerotic calcification of the major vessels at the base of the brain. Skull: No skull fracture Sinuses/Orbits: Chronic inflammatory changes in the right division of the sphenoid sinus. Other sinuses are clear. No orbital abnormality seen. Other: None IMPRESSION: No acute intracranial finding. Mild age related atrophy and chronic small vessel change, less than often seen at this age. Chronic inflammatory changes of the right division of the sphenoid sinus. Electronically Signed   By: Nelson Chimes M.D.   On: 05/09/2016 22:45   Ct Cervical Spine Wo Contrast  Result Date: 05/09/2016 CLINICAL DATA:  [place] today EXAM: CT CERVICAL SPINE WITHOUT CONTRAST TECHNIQUE: Multidetector CT imaging of the cervical spine was performed without intravenous contrast. Multiplanar CT image reconstructions were also  generated. COMPARISON:  None. FINDINGS: Alignment: Slight straightening of the normal lordosis. No traumatic malalignment. Skull base and vertebrae: No fracture or primary bone lesion. Soft tissues and spinal canal: No soft tissue injury. Disc levels: Degenerative spondylosis from C3-4 through C6-7. Mild osteophytic encroachment upon the foramina. Facet osteoarthritis from C2-3 through C4-5. Upper chest: Normal. The scan does not quite reach the area of concern at plain radiography. Other: None IMPRESSION: No acute or traumatic finding. Chronic degenerative spondylosis and facet osteoarthritis. Electronically Signed   By: Nelson Chimes M.D.   On: 05/09/2016 22:48   Dg Humerus Right  Result Date: 05/09/2016 CLINICAL DATA:  Right shoulder  and humerus pain after fall EXAM: RIGHT SHOULDER - 2+ VIEW; RIGHT HUMERUS - 2+ VIEW COMPARISON:  02/07/2015 chest radiograph, 06/29/2013 right shoulder radiographs FINDINGS: There is an acute on chronic right surgical neck fracture with one shaft width anterior displacement of the humeral shaft relative to the humeral head. The humeral head maintains its articulation with the glenoid. The acromioclavicular joint and sternoclavicular joint are intact. There is platelike atelectasis and/or scarring in the right upper lobe. No adjacent rib fracture is identified. The scapula appears intact. IMPRESSION: Acute on chronic right surgical neck fracture of the humerus with anterior displacement of the distal fracture fragment one shaft width. Electronically Signed   By: Ashley Royalty M.D.   On: 05/09/2016 20:57    Procedures Procedures (including critical care time)  Medications Ordered in ED Medications - No data to display   Initial Impression / Assessment and Plan / ED Course  I have reviewed the triage vital signs and the nursing notes.  Pertinent labs & imaging results that were available during my care of the patient were reviewed by me and considered in my medical  decision making (see chart for details).  Clinical Course    Patient presents after fall at home with right shoulder injury. History by patient makes syncope unclear. She denies recent illness or feeling unwell. She feels her injuries from the fall are limited to right shoulder.   She is comfortable with IV pain medications. Shoulder immobilizer provided to stabilize fracture. VSS. No change in mental status over observation in ED.  The patient will require admission for ?syncope, advanced age and limited mobility. She is found to have a UTI which is treated in the ED with rocephin. She is examined by Dr. Darl Householder who agrees with admission.   Discussed with Dr. Eulas Post who accepts patient onto her service.   Final Clinical Impressions(s) / ED Diagnoses   Final diagnoses:  None  1. UTI 2. ?syncope 3. Right shoulder fracture   New Prescriptions New Prescriptions   No medications on file    I personally performed the services described in this documentation, which was scribed in my presence. The recorded information has been reviewed and is accurate.      Charlann Lange, PA-C 05/10/16 0038    Drenda Freeze, MD 05/11/16 1236

## 2016-05-09 NOTE — ED Notes (Signed)
Bed: [id number] Expected date:  Expected time:  Means of arrival:  Comments: 82 yo F  Fall, right shoulder deformity

## 2016-05-10 ENCOUNTER — Encounter (HOSPITAL_COMMUNITY): Payer: Self-pay | Admitting: *Deleted

## 2016-05-10 ENCOUNTER — Other Ambulatory Visit: Payer: Self-pay

## 2016-05-10 DIAGNOSIS — N390 Urinary tract infection, site not specified: Secondary | ICD-10-CM

## 2016-05-10 DIAGNOSIS — E860 Dehydration: Secondary | ICD-10-CM | POA: Diagnosis not present

## 2016-05-10 DIAGNOSIS — W19XXXA Unspecified fall, initial encounter: Secondary | ICD-10-CM

## 2016-05-10 DIAGNOSIS — R55 Syncope and collapse: Principal | ICD-10-CM | POA: Diagnosis present

## 2016-05-10 DIAGNOSIS — G629 Polyneuropathy, unspecified: Secondary | ICD-10-CM | POA: Diagnosis not present

## 2016-05-10 DIAGNOSIS — S42411A Displaced simple supracondylar fracture without intercondylar fracture of right humerus, initial encounter for closed fracture: Secondary | ICD-10-CM

## 2016-05-10 DIAGNOSIS — S42291A Other displaced fracture of upper end of right humerus, initial encounter for closed fracture: Secondary | ICD-10-CM

## 2016-05-10 DIAGNOSIS — I10 Essential (primary) hypertension: Secondary | ICD-10-CM | POA: Diagnosis not present

## 2016-05-10 LAB — BASIC METABOLIC PANEL
Anion gap: 7 (ref 5–15)
BUN: 20 mg/dL (ref 6–20)
CHLORIDE: 100 mmol/L — AB (ref 101–111)
CO2: 26 mmol/L (ref 22–32)
Calcium: 8.9 mg/dL (ref 8.9–10.3)
Creatinine, Ser: 0.73 mg/dL (ref 0.44–1.00)
GFR calc non Af Amer: >60 mL/min (ref >60)
Glucose, Bld: 144 mg/dL — ABNORMAL HIGH (ref 65–99)
Potassium: 3.5 mmol/L (ref 3.5–5.1)
Sodium: 133 mmol/L — ABNORMAL LOW (ref 135–145)

## 2016-05-10 LAB — GLUCOSE, CAPILLARY: GLUCOSE-CAPILLARY: 133 mg/dL — AB (ref 65–99)

## 2016-05-10 LAB — PROTIME-INR
INR: 1.05
Prothrombin Time: 13.8 seconds (ref 11.4–15.2)

## 2016-05-10 LAB — APTT: aPTT: 26 seconds (ref 24–36)

## 2016-05-10 MED ORDER — ONDANSETRON HCL 4 MG/2ML IJ SOLN: 4.0000 mg | Freq: Four times a day (QID) | INTRAMUSCULAR | Status: DC | PRN

## 2016-05-10 MED ORDER — DEXTROSE 5 % IV SOLN
1.0000 g | Freq: Once | INTRAVENOUS | Status: AC
  Administered 2016-05-10: 1 g via INTRAVENOUS
  Filled 2016-05-10: qty 10

## 2016-05-10 MED ORDER — HYDROCODONE-ACETAMINOPHEN 7.5-325 MG PO TABS: 1.0000 | ORAL_TABLET | ORAL | Status: DC | PRN

## 2016-05-10 MED ORDER — FOLIC ACID 1 MG PO TABS
1.0000 mg | ORAL_TABLET | Freq: Every day | ORAL | Status: DC
  Administered 2016-05-10 – 2016-05-11 (×2): 1 mg via ORAL
  Filled 2016-05-10 (×3): qty 1

## 2016-05-10 MED ORDER — ACETAMINOPHEN 325 MG PO TABS: 650.0000 mg | ORAL_TABLET | Freq: Four times a day (QID) | ORAL | Status: DC | PRN

## 2016-05-10 MED ORDER — ONDANSETRON HCL 4 MG PO TABS: 4.0000 mg | ORAL_TABLET | Freq: Four times a day (QID) | ORAL | Status: DC | PRN

## 2016-05-10 MED ORDER — HYDROCODONE-ACETAMINOPHEN 5-325 MG PO TABS: 1.0000 | ORAL_TABLET | Freq: Four times a day (QID) | ORAL | Status: DC | PRN

## 2016-05-10 MED ORDER — VITAMIN D 1000 UNITS PO TABS
1000.0000 [IU] | ORAL_TABLET | Freq: Every day | ORAL | Status: DC
  Administered 2016-05-10 – 2016-05-11 (×2): 1000 [IU] via ORAL
  Filled 2016-05-10 (×2): qty 1

## 2016-05-10 MED ORDER — CROMOLYN SODIUM 4 % OP SOLN
1.0000 [drp] | Freq: Four times a day (QID) | OPHTHALMIC | Status: DC
  Administered 2016-05-10 – 2016-05-11 (×5): 1 [drp] via OPHTHALMIC
  Filled 2016-05-10: qty 10

## 2016-05-10 MED ORDER — SIMVASTATIN 10 MG PO TABS
10.0000 mg | ORAL_TABLET | Freq: Every day | ORAL | Status: DC
  Administered 2016-05-10: 10 mg via ORAL
  Filled 2016-05-10: qty 1

## 2016-05-10 MED ORDER — LATANOPROST 0.005 % OP SOLN
1.0000 [drp] | Freq: Every day | OPHTHALMIC | Status: DC
  Administered 2016-05-10: 1 [drp] via OPHTHALMIC
  Filled 2016-05-10: qty 2.5

## 2016-05-10 MED ORDER — DEXTROSE 5 % IV SOLN
1.0000 g | INTRAVENOUS | Status: DC
  Administered 2016-05-10: 1 g via INTRAVENOUS
  Filled 2016-05-10: qty 10

## 2016-05-10 MED ORDER — SODIUM CHLORIDE 0.9% FLUSH
3.0000 mL | Freq: Two times a day (BID) | INTRAVENOUS | Status: DC
  Administered 2016-05-10: 3 mL via INTRAVENOUS

## 2016-05-10 MED ORDER — POTASSIUM CHLORIDE CRYS ER 10 MEQ PO TBCR
10.0000 meq | EXTENDED_RELEASE_TABLET | Freq: Every day | ORAL | Status: DC
  Administered 2016-05-10 – 2016-05-11 (×2): 10 meq via ORAL
  Filled 2016-05-10 (×2): qty 1

## 2016-05-10 MED ORDER — PANTOPRAZOLE SODIUM 40 MG PO TBEC
40.0000 mg | DELAYED_RELEASE_TABLET | Freq: Every day | ORAL | Status: DC
  Administered 2016-05-10 – 2016-05-11 (×2): 40 mg via ORAL
  Filled 2016-05-10 (×2): qty 1

## 2016-05-10 MED ORDER — GABAPENTIN 300 MG PO CAPS
300.0000 mg | ORAL_CAPSULE | Freq: Every day | ORAL | Status: DC
  Administered 2016-05-10 – 2016-05-11 (×2): 300 mg via ORAL
  Filled 2016-05-10 (×2): qty 1

## 2016-05-10 MED ORDER — SODIUM CHLORIDE 0.9 % IV SOLN
INTRAVENOUS | Status: AC
  Administered 2016-05-10: 03:00:00 via INTRAVENOUS

## 2016-05-10 MED ORDER — AMLODIPINE BESYLATE 5 MG PO TABS
5.0000 mg | ORAL_TABLET | Freq: Every day | ORAL | Status: DC
  Administered 2016-05-10 – 2016-05-11 (×2): 5 mg via ORAL
  Filled 2016-05-10 (×2): qty 1

## 2016-05-10 MED ORDER — MAGIC MOUTHWASH
5.0000 mL | Freq: Four times a day (QID) | ORAL | Status: DC
  Administered 2016-05-10 – 2016-05-11 (×3): 5 mL via ORAL
  Filled 2016-05-10 (×7): qty 5

## 2016-05-10 MED ORDER — ASPIRIN EC 81 MG PO TBEC
81.0000 mg | DELAYED_RELEASE_TABLET | Freq: Every day | ORAL | Status: DC
  Administered 2016-05-10 – 2016-05-11 (×2): 81 mg via ORAL
  Filled 2016-05-10 (×2): qty 1

## 2016-05-10 MED ORDER — LORATADINE 10 MG PO TABS
10.0000 mg | ORAL_TABLET | Freq: Every day | ORAL | Status: DC
Start: 2016-05-10 — End: 2016-05-11
  Administered 2016-05-10 – 2016-05-11 (×2): 10 mg via ORAL
  Filled 2016-05-10 (×2): qty 1

## 2016-05-10 MED ORDER — ACETAMINOPHEN 650 MG RE SUPP: 650.0000 mg | Freq: Four times a day (QID) | RECTAL | Status: DC | PRN

## 2016-05-10 MED ORDER — KETOROLAC TROMETHAMINE 15 MG/ML IJ SOLN
15.0000 mg | Freq: Once | INTRAMUSCULAR | Status: AC
  Administered 2016-05-10: 15 mg via INTRAVENOUS
  Filled 2016-05-10: qty 1

## 2016-05-10 NOTE — Progress Notes (Signed)
Pharmacy Antibiotic Note  [name] is a 82 y.o. female admitted on 05/09/2016 with UTI.  Pharmacy has been consulted for Ceftriaxone dosing.  Plan: Ceftriaxone 1gm iv q24hr    Height: 5\' 4"  (162.6 cm) Weight: 140 lb (63.5 kg) IBW/kg (Calculated) : 54.7  Temp (24hrs), Avg:97.7 F (36.5 C), Min:97.7 F (36.5 C), Max:97.7 F (36.5 C)   Recent Labs Lab 05/09/16 2209  WBC 9.5  CREATININE 0.81    Estimated Creatinine Clearance: 42.3 mL/min (by C-G formula based on SCr of 0.81 mg/dL).    Allergies  Allergen Reactions  . Pneumococcal Vaccines Swelling and Rash    Pneumococcal Vaccine-23 valent  . Clarithromycin Other (See Comments)    REACTION: sore mouth  . Hctz [Hydrochlorothiazide]     Low Na  . Oxycodone-Aspirin Other (See Comments)    REACTION: horrible nightmares  . Propoxyphene N-Acetaminophen Other (See Comments)    REACTION: couldn't wake her up    Antimicrobials this admission: Ceftriaxone 10/18 >>  Dose adjustments this admission:   Microbiology results: pending  Thank you for allowing pharmacy to be a part of this patient's care.  Nani Skillern Crowford 05/10/2016 2:26 AM

## 2016-05-10 NOTE — Clinical Social Work Note (Signed)
Clinical Social Work Assessment  Patient Details  Name: [name] MRN: [id number] Date of Birth: 10/26/1925  Date of referral:  05/10/16               Reason for consult:  Facility Placement, Discharge Planning                Permission sought to share information with:  Case Manager, Facility Contact Representative, Family Supports Permission granted to share information::  Yes, Verbal Permission Granted  Name::        Agency::  [facility]  Relationship::   Ronald Ahart   Contact Information:   [phone]  Housing/Transportation Living arrangements for the past 2 months:  Skilled Nursing Facility Source of Information:  Patient Patient Interpreter Needed:  None Criminal Activity/Legal Involvement Pertinent to Current Situation/Hospitalization:  No - Comment as needed Significant Relationships:  Adult Children, Community Support, Friend Lives with:  Self Do you feel safe going back to the place where you live?  Yes Need for family participation in patient care:  Yes (Comment)  Care giving concerns:  Patient reports she lives alone and fell outside her home.  She hurt her shoulder and hit her head. The patient reported she felt light-headed and confused after the fall. She was seen by PT that recommends SNF- patient is agreeable at this time.    Social Worker assessment / plan:  LCSWA met with patient at bedside and explained reason for consult. Patient reports she wants to go to rehab at [facility] if possible because she lives in the area, her church is there and friends. 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 She reports she is not familiar with other facilities.   Employment status:    Insurance information:  Medicare PT Recommendations:  Skilled Nursing Facility, 24 Hour Supervision Information / Referral to community resources:  Skilled Nursing Facility  Patient/Family's Response to care:  Agreeable to SNF and care.    Patient/Family's Understanding of and Emotional Response to Diagnosis, Current Treatment, and Prognosis:  " I want to do rehab in a place where my friends, pastor and family can come and visit me."   Emotional Assessment Appearance:  Appears stated age Attitude/Demeanor/Rapport:    Affect (typically observed):  Calm, Pleasant, Accepting Orientation:  Oriented to Self, Oriented to Place, Oriented to  Time, Oriented to Situation Alcohol / Substance use:  Never Used Psych involvement (Current and /or in the community):  No (Comment)  Discharge Needs  Concerns to be addressed:  Care Coordination, Discharge Planning Concerns Readmission within the last 30 days:  No Current discharge risk:  None Barriers to Discharge:  Continued Medical Work up    A , LCSW 05/10/2016, 12:03 PM  

## 2016-05-10 NOTE — Progress Notes (Signed)
Patient ID: [name], female   DOB: Oct 22, 1925, 82 y.o.   MRN: [id number] I have reviewed the patient's x-rays and agree with current treatment in a sling.  I will see her tomorrow and provide full consult note.

## 2016-05-10 NOTE — H&P (Signed)
History and Physical    [name] [id number] DOB: 02/26/1926 DOA: 05/09/2016  PCP: Walker Kehr, MD  Heme/onc: Dr. Benay Spice  Patient coming from: Home  Chief Complaint: Right shoulder pain after fall, syncopal episode  HPI: [name] is a 82 y.o. woman with multiple comorbidities including CLL, hypogammglobulinemia, B12 deficiency, neuropathy, HTN, and HLD who lives independently at baseline.  She had a syncopal even associated with fall, head trauma, and right should injury outside of her home.  Her neighbor heard her fall and called for help.  The patient recalls becoming light-headed, then waking up on the ground.  She denies preceding headache, chest pain, shortness of breath, or vision disturbance.  When she awakened, she was aware of the fact that she was too weak to get up, but she was not confused.  She had immediate shoulder pain.  She denies bowel or bladder incontinence.  She was brought to [facility] by EMS personnel for further evaluation.  She admits to generalized weakness ("generally feeling ill") today, but that had not stopped her from attending choir rehearsal at her church and running several errands.  She also admits to urinary frequency in the past 24 hours.  She has a history of recurrent UTI.  Her only new medication is magic mouthwash, prescribed for subacute mouth pain.  No recent oral antibiotics.  No recent changes to her chronic medications.  She says that her appetite has been stable.  ED Course: Xray shows acute on chronic right surgical neck fracture of the humerus with anterior displacement of the distal fracture fragment.  Chest xray has an incidental finding of a possible pulmonary nodule; otherwise, no acute processes in the chest.  CT of the head and cervical spine negative for acute process.  EKG shows low voltage but no acute ST segment changes.  Troponin negative.  U/A is nitrite positive with 6-30 WBC and few bacteria.  Urine culture sent.  WBC  normal.  Platelet 143.  Sodiume 134.  Chloride 100.  Normal BUN and creatinine.  Right arm to be placed in a sling by the ED personnel.  Ortho has not been called tonight.  IV Rocephin for UTI.  She has received IV fentanyl for pain.  Hospitalist asked to admit.  Review of Systems: As per HPI otherwise 10 point review of systems negative.    Past Medical History:  Diagnosis Date  . Allergy    rhinitis  . Chronic ethmoidal sinusitis   . Diverticulosis of colon   . Glaucoma   . Hx of colonic polyps   . Hyperlipidemia   . Hypertension   . Hypogammaglobulinemia ([place])    Monthly IVIG  . Leukemia ([place])    Chronic lymphocytic leukemia  . Osteoarthritis   . Peripheral neuropathy ([place])   . Pernicious anemia   . UTI (lower urinary tract infection) 2010  . Vitamin B12 deficiency     Past Surgical History:  Procedure Laterality Date  . ABDOMINAL HYSTERECTOMY  1987  . bilateral cataracts  2007  . bladder tack  1987  . L4-L5 Laminetomy  1999  . TUBAL LIGATION    . tubaligation  1961  . ureter reconstruction  1992     reports that she has never smoked. She has never used smokeless tobacco. She reports that she does not drink alcohol or use drugs.  She is a widow.  She identifies her son as next of kin.  Allergies  Allergen Reactions  . Pneumococcal Vaccines Swelling and  Rash    Pneumococcal Vaccine-23 valent  . Clarithromycin Other (See Comments)    REACTION: sore mouth  . Hctz [Hydrochlorothiazide]     Low Na  . Oxycodone-Aspirin Other (See Comments)    REACTION: horrible nightmares  . Propoxyphene N-Acetaminophen Other (See Comments)    REACTION: couldn't wake her up    Family History  Problem Relation Age of Onset  . Jaundice Father   . Stroke Son 87    in NH  . Cancer Mother     uterine  . Diabetes Mother   . Other Sister     brain tumor  . Cancer Sister   . Hypertension Other   . Cancer Other     breast  . Thyroid disease Sister   . Cancer Sister      thyroid cancer     Prior to Admission medications   Medication Sig Start Date End Date Taking? Authorizing Provider  acetaminophen (TYLENOL) 500 MG tablet Take 500 mg by mouth 2 (two) times daily.    Yes Historical Provider, MD  amLODipine (NORVASC) 5 MG tablet TAKE 1 TABLET (5 MG TOTAL) BY MOUTH DAILY. 02/08/15  Yes Evie Lacks Plotnikov, MD  aspirin 81 MG EC tablet Take 81 mg by mouth daily.     Yes Historical Provider, MD  cetirizine (ZYRTEC) 10 MG tablet Take 1 tablet (10 mg total) by mouth daily. 05/04/16  Yes Charlene Brooke Nche, NP  Cholecalciferol (EQL VITAMIN D3) 1000 UNITS tablet Take 1,000 Units by mouth daily.     Yes Historical Provider, MD  conjugated estrogens (PREMARIN) vaginal cream 1/2 gram vaginally every night x 1 week, then twice weekly at bedtime 12/29/15  Yes Salvadore Dom, MD  cromolyn (OPTICROM) 4 % ophthalmic solution 1 DROP IN BOTH EYES FOUR TIMES A DAY 03/21/16  Yes Historical Provider, MD  cyanocobalamin (,VITAMIN B-12,) 1000 MCG/ML injection ADMINISTER 1 CC SUBQ EVERY 4 WEEKS AS DIRECTED 07/08/15  Yes Cassandria Anger, MD  Diphenhyd-Hydrocort-Nystatin (FIRST-DUKES MOUTHWASH) SUSP Use as directed 5 mLs in the mouth or throat 3 (three) times daily before meals. 05/04/16 05/11/16 Yes Charlene Brooke Nche, NP  fluticasone (FLONASE) 50 MCG/ACT nasal spray Place 2 sprays into both nostrils daily. 05/04/16  Yes Charlene Brooke Nche, NP  folic acid (FOLVITE) 1 MG tablet Take 1 mg by mouth daily. For restless legs   Yes Historical Provider, MD  gabapentin (NEURONTIN) 300 MG capsule TAKE ONE CAPSULE BY MOUTH EVERY DAY 04/14/16  Yes Evie Lacks Plotnikov, MD  hydrochlorothiazide (MICROZIDE) 12.5 MG capsule TAKE ONE CAPSULE BY MOUTH EVERY DAY FOR BLOOD PRESSURE 07/08/15  Yes Evie Lacks Plotnikov, MD  KLOR-CON M10 10 MEQ tablet TAKE 1 TABLET (10 MEQ TOTAL) BY MOUTH DAILY. 03/06/16  Yes Evie Lacks Plotnikov, MD  latanoprost (XALATAN) 0.005 % ophthalmic solution Place 1 drop into both eyes  at bedtime. 03/03/16  Yes Evie Lacks Plotnikov, MD  loratadine (CLARITIN) 10 MG tablet Take 1 tablet (10 mg total) by mouth daily. 07/09/15  Yes Evie Lacks Plotnikov, MD  pantoprazole (PROTONIX) 40 MG tablet Take 1 tablet (40 mg total) by mouth daily. 08/05/15  Yes Evie Lacks Plotnikov, MD  simvastatin (ZOCOR) 20 MG tablet TAKE 1 TABLET (20 MG TOTAL) BY MOUTH DAILY. Patient taking differently: TAKE 1 TABLET (10 MG TOTAL) BY MOUTH DAILY. 01/31/16  Yes Evie Lacks Plotnikov, MD  B-D 3CC LUER-LOK SYR 25GX1" 25G X 1" 3 ML MISC USE TO ADMINISTER B12 INJECTIONS 09/08/13   Evie Lacks Plotnikov,  MD  B-D INS SYR ULTRAFINE 1CC/30G 30G X 1/2" 1 ML MISC USE TO ADMINISTER B12 INJECTIONS 04/25/12   Evie Lacks Plotnikov, MD  Insulin Syringe-Needle U-100 (B-D INS SYRINGE 0.5CC/30GX1/2") 30G X 1/2" 0.5 ML MISC by Does not apply route. To administer B12 injections     Historical Provider, MD    Physical Exam: Vitals:   05/09/16 1943 05/09/16 1944  BP: 143/62   Pulse: 66   Resp: 17   Temp: 97.7 F (36.5 C)   TempSrc: Oral   SpO2: 97% 98%  Weight: 63.5 kg (140 lb)   Height: 5\' 4"  (1.626 m)       Constitutional: NAD, calm, comfortable at rest, NONtoxic appearing Vitals:   05/09/16 1943 05/09/16 1944  BP: 143/62   Pulse: 66   Resp: 17   Temp: 97.7 F (36.5 C)   TempSrc: Oral   SpO2: 97% 98%  Weight: 63.5 kg (140 lb)   Height: 5\' 4"  (1.626 m)    Eyes: PERRL, lids and conjunctivae normal ENMT: Mucous membranes are DRY. Posterior pharynx clear of any exudate or lesions. She wears dentures. Neck: normal appearance, supple Respiratory: clear to auscultation listening anteriorly.  No wheezing, no crackles. Normal respiratory effort. No accessory muscle use.  Cardiovascular: Normal rate, regular rhythm, no murmurs / rubs / gallops. No extremity edema. 2+ pedal pulses.  GI: abdomen is soft and compressible.  No distention.  No tenderness.  Bowel sounds are present. Musculoskeletal:  Gross deformity at right  shoulder.  Good ROM in other extremities.  No contractures. Normal muscle tone.  Skin: no rashes, pale and cool Neurologic: ROM limited in right arm due to acute pain.  No focal deficits. Psychiatric: Normal judgment and insight. Alert and oriented x 3. Normal mood.     Labs on Admission: I have personally reviewed following labs and imaging studies  CBC:  Recent Labs Lab 05/09/16 2209  WBC 9.5  NEUTROABS 7.0  HGB 13.0  HCT 37.8  MCV 89.8  PLT [id number]*   Basic Metabolic Panel:  Recent Labs Lab 05/09/16 2209  NA 134*  K 3.6  CL 100*  CO2 27  GLUCOSE 152*  BUN 19  CREATININE 0.81  CALCIUM 8.9   GFR: Estimated Creatinine Clearance: 42.3 mL/min (by C-G formula based on SCr of 0.81 mg/dL). Liver Function Tests:  Recent Labs Lab 05/09/16 2209  AST 23  ALT 19  ALKPHOS 43  BILITOT 0.9  PROT 7.4  ALBUMIN 3.8   Urine analysis:    Component Value Date/Time   COLORURINE ORANGE (A) 05/09/2016 2252   APPEARANCEUR CLOUDY (A) 05/09/2016 2252   LABSPEC 1.020 05/09/2016 2252   LABSPEC 1.005 04/29/2015 1103   PHURINE 6.0 05/09/2016 2252   GLUCOSEU NEGATIVE 05/09/2016 2252   GLUCOSEU NEGATIVE 03/03/2016 1017   GLUCOSEU Negative 04/29/2015 1103   HGBUR NEGATIVE 05/09/2016 2252   HGBUR trace-intact 07/07/2008 1058   BILIRUBINUR NEGATIVE 05/09/2016 2252   BILIRUBINUR neg 12/29/2015 1523   BILIRUBINUR Negative 04/29/2015 1103   KETONESUR 15 (A) 05/09/2016 2252   PROTEINUR NEGATIVE 05/09/2016 2252   UROBILINOGEN 0.2 03/03/2016 1017   UROBILINOGEN 0.2 04/29/2015 1103   NITRITE POSITIVE (A) 05/09/2016 2252   LEUKOCYTESUR SMALL (A) 05/09/2016 2252   LEUKOCYTESUR Negative 04/29/2015 1103    Radiological Exams on Admission: Dg Chest 2 View  Result Date: 05/09/2016 CLINICAL DATA:  Right anterior chest pain after falling today. EXAM: CHEST  2 VIEW COMPARISON:  02/07/2015 FINDINGS: Heart size is normal. There is  atherosclerosis of the aorta. The lungs are clear except for a  questionable 1 cm nodule in the right upper lobe. This should be evaluated when able. No effusions or heart failure. There is an acute fracture the proximal right humerus. IMPRESSION: Acute fracture proximal right humerus. Question 1 cm nodule right upper lobe. Followup with repeat radiography and/or CT. Aortic atherosclerosis. Electronically Signed   By: Nelson Chimes M.D.   On: 05/09/2016 22:43   Dg Shoulder Right  Result Date: 05/09/2016 CLINICAL DATA:  Right shoulder and humerus pain after fall EXAM: RIGHT SHOULDER - 2+ VIEW; RIGHT HUMERUS - 2+ VIEW COMPARISON:  02/07/2015 chest radiograph, 06/29/2013 right shoulder radiographs FINDINGS: There is an acute on chronic right surgical neck fracture with one shaft width anterior displacement of the humeral shaft relative to the humeral head. The humeral head maintains its articulation with the glenoid. The acromioclavicular joint and sternoclavicular joint are intact. There is platelike atelectasis and/or scarring in the right upper lobe. No adjacent rib fracture is identified. The scapula appears intact. IMPRESSION: Acute on chronic right surgical neck fracture of the humerus with anterior displacement of the distal fracture fragment one shaft width. Electronically Signed   By: Ashley Royalty M.D.   On: 05/09/2016 20:57   Ct Head Wo Contrast  Result Date: 05/09/2016 CLINICAL DATA:  [place] today.  Arm injury. EXAM: CT HEAD WITHOUT CONTRAST TECHNIQUE: Contiguous axial images were obtained from the base of the skull through the vertex without intravenous contrast. COMPARISON:  03/10/2016 FINDINGS: Brain: There is not accelerated atrophy for age. There are mild chronic small-vessel changes of the white matter. No sign of acute infarction, mass lesion, hemorrhage, hydrocephalus or extra-axial collection. Vascular: There is atherosclerotic calcification of the major vessels at the base of the brain. Skull: No skull fracture Sinuses/Orbits: Chronic inflammatory changes  in the right division of the sphenoid sinus. Other sinuses are clear. No orbital abnormality seen. Other: None IMPRESSION: No acute intracranial finding. Mild age related atrophy and chronic small vessel change, less than often seen at this age. Chronic inflammatory changes of the right division of the sphenoid sinus. Electronically Signed   By: Nelson Chimes M.D.   On: 05/09/2016 22:45   Ct Cervical Spine Wo Contrast  Result Date: 05/09/2016 CLINICAL DATA:  [place] today EXAM: CT CERVICAL SPINE WITHOUT CONTRAST TECHNIQUE: Multidetector CT imaging of the cervical spine was performed without intravenous contrast. Multiplanar CT image reconstructions were also generated. COMPARISON:  None. FINDINGS: Alignment: Slight straightening of the normal lordosis. No traumatic malalignment. Skull base and vertebrae: No fracture or primary bone lesion. Soft tissues and spinal canal: No soft tissue injury. Disc levels: Degenerative spondylosis from C3-4 through C6-7. Mild osteophytic encroachment upon the foramina. Facet osteoarthritis from C2-3 through C4-5. Upper chest: Normal. The scan does not quite reach the area of concern at plain radiography. Other: None IMPRESSION: No acute or traumatic finding. Chronic degenerative spondylosis and facet osteoarthritis. Electronically Signed   By: Nelson Chimes M.D.   On: 05/09/2016 22:48   Dg Humerus Right  Result Date: 05/09/2016 CLINICAL DATA:  Right shoulder and humerus pain after fall EXAM: RIGHT SHOULDER - 2+ VIEW; RIGHT HUMERUS - 2+ VIEW COMPARISON:  02/07/2015 chest radiograph, 06/29/2013 right shoulder radiographs FINDINGS: There is an acute on chronic right surgical neck fracture with one shaft width anterior displacement of the humeral shaft relative to the humeral head. The humeral head maintains its articulation with the glenoid. The acromioclavicular joint and sternoclavicular joint are intact. There  is platelike atelectasis and/or scarring in the right upper lobe. No  adjacent rib fracture is identified. The scapula appears intact. IMPRESSION: Acute on chronic right surgical neck fracture of the humerus with anterior displacement of the distal fracture fragment one shaft width. Electronically Signed   By: Ashley Royalty M.D.   On: 05/09/2016 20:57    EKG: Independently reviewed. As noted above.  Assessment/Plan Principal Problem:   Syncope and collapse Active Problems:   Peripheral neuropathy (HCC)   Essential hypertension   Dehydration   Closed displaced simple supracondylar fracture of right humerus without intercondylar fracture   UTI (urinary tract infection)  Pulmonary nodule    Syncope and collapse secondary to UTI and dehydration, causing fall with acute humerus fracture --Admit to telemetry, repeat 12 lead EKG x 1 in the AM --Fall precautions --IV Rocephin, urine culture pending --Check orthostatics --HOLD HCTZ --NS at 100cc/hr for 1 L --PT and OT eval and treat --Social work consult for discharge planning/SNF referral  Humerus fracture --Patient to be put in sling by ED attending --I would get ortho opinion in the AM (fracture is closed but it is displaced) --NPO except for meds for now --Analgesics as needed --PT/OT eval and treat  HTN --Continue home dose of amlodipine for now --Holding HCTZ for syncope  HLD --Continue reported dose of simvastatin  History of peripheral neuropathy --Continue home dose of neurontin  Pulmonary nodule, incidental finding on chest xray --She will need outpatient CT scan of her chest  Subacute mouth pain --Continue magic mouthwash   DVT prophylaxis: SCDs Code Status: FULL Family Communication: Patient alone in the ED at the time of admission. Disposition Plan: To be determined but I expect she will need placement. Consults called: None.  I recommend ortho consultation in the AM prior to letting her eat. Admission status: Inpatient telemetry  I expect her length of stay to be greater  than 2 midnight based on her age, nature of her injuries, and pending evaluations.  With her living independently prior to her fall and humerus fracture (but still using a walker or cane at baseline), I expect she will have limitations in her ADLs that would make it unsafe for her to discharge home.   TIME SPENT: 70 minutes   Eber Jones MD [facility] Pager [phone]  If 7PM-7AM, please contact night-coverage [email or web address] Password [email or web address]  05/10/2016, 12:41 AM

## 2016-05-10 NOTE — Progress Notes (Signed)
PHARMACY NOTE -  ceftriaxone  Pharmacy has been assisting with dosing of ceftriaxone for UTI. Dosage remains stable at 1gm IV q24h and need for further dosage adjustment appears unlikely at present.    Will sign off at this time.  Please reconsult if a change in clinical status warrants re-evaluation of dosage.  Dia Sitter, PharmD, BCPS 05/10/2016 10:43 AM

## 2016-05-10 NOTE — Care Management CC44 (Signed)
Condition Code 44 Documentation Completed  Patient Details  Name: [name] MRN: [id number] Date of Birth: [birth year]   Condition Code 44 given:    Patient signature on Condition Code 44 notice:    Documentation of 2 MD's agreement:    Code 44 added to claim:       Leeroy Cha, RN 05/10/2016, 3:33 PM

## 2016-05-10 NOTE — Progress Notes (Signed)
[facility] PROGRESS NOTE  [name] [id number] DOB: 1926/05/21 DOA: 05/09/2016  PCP: Walker Kehr, MD  Brief History/Interval Summary: 82 year old Caucasian female with the past medical history of CLL, hypo-gammaglobulinemia, B-12 deficiency, neuropathy, hypertension and hyperlipidemia presented after sustaining a syncopal event at home. She fell on her right side. She complained of right shoulder pain. Patient was found to have abnormal UA suggesting UTI. She was hospitalized for further management.  Reason for Visit: Syncope  Consultants: Orthopedics  Procedures: None yet. Echocardiogram is pending  Antibiotics: Ceftriaxone  Subjective/Interval History: Patient states that the pain in her right shoulder is improved this morning, although she is not able to move it much. Denies any chest pain, shortness of breath. She does tell me that she's been having difficulty urinating for the past few days but did not mention it to anybody.  ROS: Denies any nausea or vomiting.  Objective:  Vital Signs  Vitals:   05/10/16 0110 05/10/16 0209 05/10/16 0525 05/10/16 1019  BP: 116/61 123/62 115/60   Pulse: 69 72 69   Resp: 16 18 18    Temp:  98.4 F (36.9 C) 98.4 F (36.9 C)   TempSrc:  Oral Oral   SpO2: 95%  95% 96%  Weight:      Height:        Intake/Output Summary (Last 24 hours) at 05/10/16 1157 Last data filed at 05/10/16 0600  Gross per 24 hour  Intake              500 ml  Output              200 ml  Net              300 ml   Filed Weights   05/09/16 1943  Weight: 63.5 kg (140 lb)    General appearance: alert, cooperative, appears stated age, distracted and no distress Resp: clear to auscultation bilaterally Cardio: regular rate and rhythm, S1, S2 normal, no murmur, click, rub or gallop GI: soft, non-tender; bowel sounds normal; no masses,  no organomegaly Extremities: Right upper extremity is in a sling Neurologic: No focal deficits. Mildly  distracted.  Lab Results:  Data Reviewed: I have personally reviewed following labs and imaging studies  CBC:  Recent Labs Lab 05/09/16 2209  WBC 9.5  NEUTROABS 7.0  HGB 13.0  HCT 37.8  MCV 89.8  PLT 143*    Basic Metabolic Panel:  Recent Labs Lab 05/09/16 2209 05/10/16 0302  NA 134* 133*  K 3.6 3.5  CL 100* 100*  CO2 27 26  GLUCOSE 152* 144*  BUN 19 20  CREATININE 0.81 0.73  CALCIUM 8.9 8.9    GFR: Estimated Creatinine Clearance: 42.8 mL/min (by C-G formula based on SCr of 0.73 mg/dL).  Liver Function Tests:  Recent Labs Lab 05/09/16 2209  AST 23  ALT 19  ALKPHOS 43  BILITOT 0.9  PROT 7.4  ALBUMIN 3.8    Coagulation Profile:  Recent Labs Lab 05/10/16 0302  INR 1.05    CBG:  Recent Labs Lab 05/10/16 0215  GLUCAP 133*     Radiology Studies: Dg Chest 2 View  Result Date: 05/09/2016 CLINICAL DATA:  Right anterior chest pain after falling today. EXAM: CHEST  2 VIEW COMPARISON:  02/07/2015 FINDINGS: Heart size is normal. There is atherosclerosis of the aorta. The lungs are clear except for a questionable 1 cm nodule in the right upper lobe. This should be evaluated when able. No effusions or heart  failure. There is an acute fracture the proximal right humerus. IMPRESSION: Acute fracture proximal right humerus. Question 1 cm nodule right upper lobe. Followup with repeat radiography and/or CT. Aortic atherosclerosis. Electronically Signed   By: Nelson Chimes M.D.   On: 05/09/2016 22:43   Dg Shoulder Right  Result Date: 05/09/2016 CLINICAL DATA:  Right shoulder and humerus pain after fall EXAM: RIGHT SHOULDER - 2+ VIEW; RIGHT HUMERUS - 2+ VIEW COMPARISON:  02/07/2015 chest radiograph, 06/29/2013 right shoulder radiographs FINDINGS: There is an acute on chronic right surgical neck fracture with one shaft width anterior displacement of the humeral shaft relative to the humeral head. The humeral head maintains its articulation with the glenoid. The  acromioclavicular joint and sternoclavicular joint are intact. There is platelike atelectasis and/or scarring in the right upper lobe. No adjacent rib fracture is identified. The scapula appears intact. IMPRESSION: Acute on chronic right surgical neck fracture of the humerus with anterior displacement of the distal fracture fragment one shaft width. Electronically Signed   By: Ashley Royalty M.D.   On: 05/09/2016 20:57   Ct Head Wo Contrast  Result Date: 05/09/2016 CLINICAL DATA:  [place] today.  Arm injury. EXAM: CT HEAD WITHOUT CONTRAST TECHNIQUE: Contiguous axial images were obtained from the base of the skull through the vertex without intravenous contrast. COMPARISON:  03/10/2016 FINDINGS: Brain: There is not accelerated atrophy for age. There are mild chronic small-vessel changes of the white matter. No sign of acute infarction, mass lesion, hemorrhage, hydrocephalus or extra-axial collection. Vascular: There is atherosclerotic calcification of the major vessels at the base of the brain. Skull: No skull fracture Sinuses/Orbits: Chronic inflammatory changes in the right division of the sphenoid sinus. Other sinuses are clear. No orbital abnormality seen. Other: None IMPRESSION: No acute intracranial finding. Mild age related atrophy and chronic small vessel change, less than often seen at this age. Chronic inflammatory changes of the right division of the sphenoid sinus. Electronically Signed   By: Nelson Chimes M.D.   On: 05/09/2016 22:45   Ct Cervical Spine Wo Contrast  Result Date: 05/09/2016 CLINICAL DATA:  [place] today EXAM: CT CERVICAL SPINE WITHOUT CONTRAST TECHNIQUE: Multidetector CT imaging of the cervical spine was performed without intravenous contrast. Multiplanar CT image reconstructions were also generated. COMPARISON:  None. FINDINGS: Alignment: Slight straightening of the normal lordosis. No traumatic malalignment. Skull base and vertebrae: No fracture or primary bone lesion. Soft tissues and  spinal canal: No soft tissue injury. Disc levels: Degenerative spondylosis from C3-4 through C6-7. Mild osteophytic encroachment upon the foramina. Facet osteoarthritis from C2-3 through C4-5. Upper chest: Normal. The scan does not quite reach the area of concern at plain radiography. Other: None IMPRESSION: No acute or traumatic finding. Chronic degenerative spondylosis and facet osteoarthritis. Electronically Signed   By: Nelson Chimes M.D.   On: 05/09/2016 22:48   Dg Humerus Right  Result Date: 05/09/2016 CLINICAL DATA:  Right shoulder and humerus pain after fall EXAM: RIGHT SHOULDER - 2+ VIEW; RIGHT HUMERUS - 2+ VIEW COMPARISON:  02/07/2015 chest radiograph, 06/29/2013 right shoulder radiographs FINDINGS: There is an acute on chronic right surgical neck fracture with one shaft width anterior displacement of the humeral shaft relative to the humeral head. The humeral head maintains its articulation with the glenoid. The acromioclavicular joint and sternoclavicular joint are intact. There is platelike atelectasis and/or scarring in the right upper lobe. No adjacent rib fracture is identified. The scapula appears intact. IMPRESSION: Acute on chronic right surgical neck fracture of the  humerus with anterior displacement of the distal fracture fragment one shaft width. Electronically Signed   By: Ashley Royalty M.D.   On: 05/09/2016 20:57     Medications:  Scheduled: . amLODipine  5 mg Oral Daily  . aspirin EC  81 mg Oral Daily  . cefTRIAXone (ROCEPHIN)  IV  1 g Intravenous Q24H  . cholecalciferol  1,000 Units Oral Daily  . cromolyn  1 drop Both Eyes QID  . folic acid  1 mg Oral Daily  . gabapentin  300 mg Oral Daily  . latanoprost  1 drop Both Eyes QHS  . loratadine  10 mg Oral Daily  . magic mouthwash  5 mL Oral QID  . pantoprazole  40 mg Oral Daily  . potassium chloride  10 mEq Oral Daily  . simvastatin  10 mg Oral q1800  . sodium chloride flush  3 mL Intravenous Q12H   Continuous:    [id number] **OR** acetaminophen, HYDROcodone-acetaminophen, ondansetron **OR** ondansetron (ZOFRAN) IV  Assessment/Plan:  Principal Problem:   Syncope and collapse Active Problems:   Peripheral neuropathy (HCC)   Essential hypertension   Dehydration   Closed displaced simple supracondylar fracture of right humerus without intercondylar fracture   Urinary tract infection without hematuria    Syncope and collapse secondary to UTI and dehydration, causing fall with acute humerus fracture Syncope was most likely secondary to urinary tract infection. EKG did not show any concerning findings. She does have first-degree AV block, however. Echocardiogram has been ordered. Mobilize as tolerated. Treat infection. She has been hydrated. Okay to cut back on IV fluids.  Urinary tract infection Await urine cultures. Continue ceftriaxone.  Acute on chronic Humerus fracture Patient in a sling. Discussed with physician assistant who works with Dr. Berenice Primas, who will evaluate the patient today.  Essential hypertension  Continue home dose of amlodipine for now. Holding HCTZ for syncope  History of hyperlipidemia  Continue reported dose of simvastatin  History of peripheral neuropathy Continue home dose of neurontin  Pulmonary nodule, incidental finding on chest xray She will need outpatient CT scan of her chest  Subacute mouth pain Complains of tongue pain ongoing for the past few weeks. This is being evaluated by her dentist. No obvious lesions noted on her tongue. Continue magic mouthwash.  DVT Prophylaxis: SCDs    Code Status: Full code  Family Communication: Discussed with the patient. No family at bedside  Disposition Plan: Management as outlined above. Patient lives by herself. May need to go to skilled nursing facility for short-term rehabilitation.    LOS: 0 days   [place] [facility] Pager [phone] 05/10/2016, 11:57 AM  If 7PM-7AM, please contact  night-coverage at [email or web address], password [facility]

## 2016-05-10 NOTE — Evaluation (Addendum)
Physical Therapy Evaluation Patient Details Name: [name] MRN: [id number] DOB: 1925-11-14 Today's Date: 05/10/2016   History of Present Illness  82 y.o. female admitted with syncope, fall, R proximal humerus fx (ortho consult pending), UTI. PMH of CLL, neuropathy  Clinical Impression  Pt admitted with above diagnosis. Pt currently with functional limitations due to the deficits listed below (see PT Problem List). Pt ambulated 220' in hall with min hand held assist, mod assist for bed mobility. She lives alone and would benefit from ST-SNF. See flowsheets for orthostatic BPs, pt denied dizziness in all positions.  Pt will benefit from skilled PT to increase their independence and safety with mobility to allow discharge to the venue listed below.       Follow Up Recommendations SNF    Equipment Recommendations  None recommended by PT    Recommendations for Other Services       Precautions / Restrictions Precautions Precautions: Fall Precaution Comments: fell in July (tripped), then 1 syncopal event with fall just prior to admission Required Braces or Orthoses: Sling Restrictions Weight Bearing Restrictions: Yes RUE Weight Bearing: Non weight bearing Other Position/Activity Restrictions: assuming RUE NWB (ortho consult pending)      Mobility  Bed Mobility Overal bed mobility: Needs Assistance Bed Mobility: Supine to Sit     Supine to sit: Mod assist     General bed mobility comments: mod A to raise trunk and pad used to pivot hips to EOB  Transfers Overall transfer level: Needs assistance Equipment used: 1 person hand held assist Transfers: Sit to/from Stand Sit to Stand: Min assist         General transfer comment: min A to rise/steady  Ambulation/Gait Ambulation/Gait assistance: Min assist Ambulation Distance (Feet): 220 Feet Assistive device: 1 person hand held assist Gait Pattern/deviations: Decreased step length - left;Decreased step length - right    Gait velocity interpretation: at or above normal speed for age/gender General Gait Details: min HHA LUE for balance, pt wore sling RUE while ambulating, no LOB  Stairs            Wheelchair Mobility    Modified Rankin (Stroke Patients Only)       Balance Overall balance assessment: History of Falls;Needs assistance   Sitting balance-Leahy Scale: Good       Standing balance-Leahy Scale: Fair                               Pertinent Vitals/Pain Pain Assessment: 0-10 Pain Score: 6  Pain Location: R shoulder Pain Descriptors / Indicators: Sore Pain Intervention(s): Limited activity within patient's tolerance;Monitored during session;Repositioned;Patient requesting pain meds-RN notified    Home Living Family/patient expects to be discharged to:: Private residence Living Arrangements: Alone Available Help at Discharge: Friend(s);Available PRN/intermittently   Home Access: Stairs to enter Entrance Stairs-Rails: Left;Right Entrance Stairs-Number of Steps: 4 Home Layout: One level Home Equipment: Walker - 2 wheels;Cane - single point      Prior Function Level of Independence: Independent with assistive device(s)         Comments: drives short distances, friends drive her longer distances, has housekeeper      Journalist, newspaper        Extremity/Trunk Assessment   Upper Extremity Assessment: RUE deficits/detail RUE Deficits / Details: sensation intact light touch R hand, grip intact RUE: Unable to fully assess due to immobilization       Lower Extremity Assessment: Overall WFL for  tasks assessed      Cervical / Trunk Assessment: Normal  Communication   Communication: No difficulties  Cognition Arousal/Alertness: Awake/alert Behavior During Therapy: WFL for tasks assessed/performed Overall Cognitive Status: Within Functional Limits for tasks assessed                      General Comments      Exercises General Exercises -  Lower Extremity Ankle Circles/Pumps: AROM;Both;10 reps;Supine   Assessment/Plan    PT Assessment Patient needs continued PT services  PT Problem List Decreased activity tolerance;Decreased mobility;Pain          PT Treatment Interventions Gait training;Functional mobility training;Therapeutic activities;Therapeutic exercise;Balance training;Patient/family education    PT Goals (Current goals can be found in the Care Plan section)  Acute Rehab PT Goals 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 PT Goal Formulation: With patient Time For Goal Achievement: 05/24/16 Potential to Achieve Goals: Good    Frequency Min 3X/week   Barriers to discharge        Co-evaluation               End of Session Equipment Utilized During Treatment: Gait belt Activity Tolerance: Patient tolerated treatment well Patient left: in chair;with call bell/phone within reach;with chair alarm set Nurse Communication: Mobility status;Patient requests pain meds         Time: 1011-1054 PT Time Calculation (min) (ACUTE ONLY): 43 min   Charges:   PT Evaluation $PT Eval Low Complexity: 1 Procedure PT Treatments $Gait Training: 8-22 mins $Therapeutic Activity: 8-22 mins   PT G Codes:       PT G-Codes **NOT FOR INPATIENT CLASS**  Functional Assessment Tool Used clinical judgement byJenniferKUhlenberg,PT at10/18/171102   Functional Limitation Mobility: Walking and moving around byJenniferKUhlenberg,PT at10/18/171102   Mobility: Walking and Moving Around Current Status [id number]) At least 40 percent but less than 60 percent impaired, limited or restricted byJenniferKUhlenberg,PT at10/18/171102   Mobility: Walking and Moving Around Goal Status [id number]) At least 1 percent but less than 20 percent impaired, limited or restricted   Mobility: Walking and Moving Around Discharge Status [phone])       Philomena Doheny 05/10/2016, 11:02 AM [phone]

## 2016-05-10 NOTE — NC FL2 (Signed)
[place] LEVEL OF CARE SCREENING TOOL     IDENTIFICATION  Patient Name: [name] Birthdate: 1926/04/18 Sex: female Admission Date (Current Location): 05/09/2016  [facility] and [place] Number:  [place] and Address:  [facility],  [place] [address], [place]      Provider Number: [id number]  Attending Physician Name and Address:  Bonnielee Haff, MD  Relative Name and Phone Number:       Current Level of Care: Hospital Recommended Level of Care: [place] Prior Approval Number:    Date Approved/Denied:   PASRR Number:    Discharge Plan: SNF    Current Diagnoses: Patient Active Problem List   Diagnosis Date Noted  . Syncope and collapse 05/10/2016  . Closed displaced simple supracondylar fracture of right humerus without intercondylar fracture 05/10/2016  . Urinary tract infection without hematuria 05/10/2016  . Humeral head fracture, right, closed, initial encounter   . Burning tongue 03/25/2016  . Post concussion syndrome 03/03/2016  . Fall 03/03/2016  . Cystocele 03/05/2015  . Dehydration 02/08/2015  . Hyponatremia 02/08/2015  . Well adult exam 10/01/2014  . Grief at loss of child 12/11/2013  . Irregular heart beat 09/29/2013  . Humeral head fracture 08/26/2013  . Actinic keratoses 08/26/2013  . Carotid bruit 05/02/2012  . Anxiety 03/17/2011  . ANEMIA, PERNICIOUS, HX OF 10/10/2007  . Peripheral neuropathy ([place]) 09/04/2007  . Chronic fatigue 08/06/2007  . Chronic lymphocytic leukemia ([place]) 06/17/2007  . ALLERGIC RHINITIS 06/17/2007  . OSTEOARTHRITIS 06/17/2007  . B12 deficiency 06/07/2007  . GASTROESOPHAGEAL REFLUX DISEASE 04/30/2007  . Dyslipidemia 12/19/2006  . Essential hypertension 12/19/2006  . DIVERTICULOSIS, COLON 05/19/2003  . COLONIC POLYPS, HX OF 01/10/2000    Orientation RESPIRATION BLADDER Height & Weight     Self, Time, Situation, Place  Normal Continent Weight: 140 lb (63.5  kg) Height:  5\' 4"  (162.6 cm)  BEHAVIORAL SYMPTOMS/MOOD NEUROLOGICAL BOWEL NUTRITION STATUS      Continent Diet  AMBULATORY STATUS COMMUNICATION OF NEEDS Skin   Limited Assist Verbally Normal                       Personal Care Assistance Level of Assistance  Bathing, Feeding, Dressing Bathing Assistance: Limited assistance Feeding assistance: Independent Dressing Assistance: Limited assistance     Functional Limitations Info  Sight, Hearing, Speech Sight Info: Adequate Hearing Info: Adequate Speech Info: Adequate    SPECIAL CARE FACTORS FREQUENCY  PT (By licensed PT)     PT Frequency: 5              Contractures      Additional Factors Info  Code Status, Allergies Code Status Info: Fullcode Allergies Info: Pneumococcal Vaccines, Clarithromycin, Hctz Hydrochlorothiazide, Oxycodone-aspirin, Propoxyphene N-acetaminophen           Current Medications (05/10/2016):  This is the current hospital active medication list Current Facility-Administered Medications  Medication Dose Route Frequency Provider Last Rate Last Dose  . acetaminophen (TYLENOL) tablet 650 mg  650 mg Oral Q6H PRN Lily Kocher, MD       Or  . acetaminophen (TYLENOL) suppository 650 mg  650 mg Rectal Q6H PRN Lily Kocher, MD      . amLODipine (NORVASC) tablet 5 mg  5 mg Oral Daily Lily Kocher, MD   5 mg at 05/10/16 0947  . aspirin EC tablet 81 mg  81 mg Oral Daily Lily Kocher, MD   81 mg at 05/10/16 0947  .  cefTRIAXone (ROCEPHIN) 1 g in dextrose 5 % 50 mL IVPB  1 g Intravenous Q24H Lily Kocher, MD      . cholecalciferol (VITAMIN D) tablet 1,000 Units  1,000 Units Oral Daily Lily Kocher, MD   1,000 Units at 05/10/16 0947  . cromolyn (OPTICROM) 4 % ophthalmic solution 1 drop  1 drop Both Eyes QID Lily Kocher, MD   1 drop at 05/10/16 0948  . folic acid (FOLVITE) tablet 1 mg  1 mg Oral Daily Lily Kocher, MD   1 mg at 05/10/16 [id number]  . gabapentin (NEURONTIN) capsule 300 mg  300 mg Oral Daily  Lily Kocher, MD   300 mg at 05/10/16 0947  . HYDROcodone-acetaminophen (NORCO/VICODIN) 5-325 MG per tablet 1-2 tablet  1-2 tablet Oral Q6H PRN Rhetta Mura Schorr, NP      . latanoprost (XALATAN) 0.005 % ophthalmic solution 1 drop  1 drop Both Eyes QHS Lily Kocher, MD      . loratadine (CLARITIN) tablet 10 mg  10 mg Oral Daily Lily Kocher, MD   10 mg at 05/10/16 0947  . magic mouthwash  5 mL Oral QID Lily Kocher, MD      . ondansetron [facility]) tablet 4 mg  4 mg Oral Q6H PRN Lily Kocher, MD       Or  . ondansetron [facility]) injection 4 mg  4 mg Intravenous Q6H PRN Lily Kocher, MD      . pantoprazole (PROTONIX) EC tablet 40 mg  40 mg Oral Daily Lily Kocher, MD   40 mg at 05/10/16 0947  . potassium chloride (K-DUR,KLOR-CON) CR tablet 10 mEq  10 mEq Oral Daily Lily Kocher, MD   10 mEq at 05/10/16 0947  . simvastatin (ZOCOR) tablet 10 mg  10 mg Oral q1800 Lily Kocher, MD      . sodium chloride flush (NS) 0.9 % injection 3 mL  3 mL Intravenous Q12H Lily Kocher, MD         Discharge Medications: Please see discharge summary for a list of discharge medications.  Relevant Imaging Results:  Relevant Lab Results:   Additional Information ss#[id number]  Lia Hopping, LCSW

## 2016-05-11 ENCOUNTER — Observation Stay (HOSPITAL_COMMUNITY): Payer: Medicare Other

## 2016-05-11 DIAGNOSIS — R55 Syncope and collapse: Secondary | ICD-10-CM | POA: Diagnosis not present

## 2016-05-11 LAB — BASIC METABOLIC PANEL
Anion gap: 5 (ref 5–15)
BUN: 17 mg/dL (ref 6–20)
CHLORIDE: 104 mmol/L (ref 101–111)
CO2: 26 mmol/L (ref 22–32)
CREATININE: 0.7 mg/dL (ref 0.44–1.00)
Calcium: 8.7 mg/dL — ABNORMAL LOW (ref 8.9–10.3)
GFR calc Af Amer: >60 mL/min (ref >60)
GFR calc non Af Amer: >60 mL/min (ref >60)
GLUCOSE: 99 mg/dL (ref 65–99)
Potassium: 3.9 mmol/L (ref 3.5–5.1)
SODIUM: 135 mmol/L (ref 135–145)

## 2016-05-11 LAB — GLUCOSE, CAPILLARY: GLUCOSE-CAPILLARY: 100 mg/dL — AB (ref 65–99)

## 2016-05-11 LAB — CBC
HCT: 34 % — ABNORMAL LOW (ref 36.0–46.0)
HEMOGLOBIN: 11.6 g/dL — AB (ref 12.0–15.0)
MCH: 31 pg (ref 26.0–34.0)
MCHC: 34.1 g/dL (ref 30.0–36.0)
MCV: 90.9 fL (ref 78.0–100.0)
Platelets: 120 10*3/uL — ABNORMAL LOW (ref 150–400)
RBC: 3.74 MIL/uL — ABNORMAL LOW (ref 3.87–5.11)
RDW: 14.6 % (ref 11.5–15.5)
WBC: 6.2 10*3/uL (ref 4.0–10.5)

## 2016-05-11 LAB — URINE CULTURE

## 2016-05-11 LAB — ECHOCARDIOGRAM COMPLETE
HEIGHTINCHES: 64 in
WEIGHTICAEL: 2235.2 [oz_av]

## 2016-05-11 MED ORDER — HYDROCODONE-ACETAMINOPHEN 5-325 MG PO TABS: 1.0000 | ORAL_TABLET | Freq: Four times a day (QID) | ORAL | 0 refills | Status: DC | PRN

## 2016-05-11 MED ORDER — CEFPODOXIME PROXETIL 200 MG PO TABS
200.0000 mg | ORAL_TABLET | Freq: Two times a day (BID) | ORAL | Status: DC
  Administered 2016-05-11: 200 mg via ORAL
  Filled 2016-05-11: qty 1

## 2016-05-11 MED ORDER — CEFPODOXIME PROXETIL 200 MG PO TABS: 200.0000 mg | ORAL_TABLET | Freq: Two times a day (BID) | ORAL | Status: DC

## 2016-05-11 NOTE — Clinical Social Work Placement (Addendum)
[facility] for transport  Nurse Given Report  CLINICAL SOCIAL WORK PLACEMENT  NOTE  Date:  05/11/2016  Patient Details  Name: [name] MRN: [id number] Date of Birth: July 03, 1926  Clinical Social Work is seeking post-discharge placement for this patient at the [place] level of care (*CSW will initial, date and re-position this form in  chart as items are completed):  Yes   Patient/family provided with [place] Work Department's list of facilities offering this level of care within the geographic area requested by the patient (or if unable, by the patient's family).  Yes   Patient/family informed of their freedom to choose among providers that offer the needed level of care, that participate in Medicare, Medicaid or managed care program needed by the patient, have an available bed and are willing to accept the patient.  Yes   Patient/family informed of [facility]'s ownership interest in [facility] and [facility], as well as of the fact that they are under no obligation to receive care at these facilities.  PASRR submitted to EDS on 05/11/16     PASRR number received on 05/11/16     Existing PASRR number confirmed on       FL2 transmitted to all facilities in geographic area requested by pt/family on       FL2 transmitted to all facilities within larger geographic area on 05/11/16     Patient informed that his/her managed care company has contracts with or will negotiate with certain facilities, including the following:  [facility], [place]     Yes   Patient/family informed of bed offers received.  Patient chooses bed at [facility], [place]     Physician recommends and patient chooses bed at [facility], [place]    Patient to be transferred to [facility], [place] on 05/11/16.  Patient to be transferred to facility by PTAR     Patient family notified on 05/11/16 of transfer.  Name of family member notified:   Grandson: Ronnie Dasher   PHYSICIAN       Additional Comment:   _______________________________________________ Lia Hopping, LCSW 05/11/2016, 2:05 PM

## 2016-05-11 NOTE — Progress Notes (Signed)
Occupational Therapy Evaluation Patient Details Name: [name] MRN: [id number] DOB: 1925/11/30 Today's Date: 05/11/2016    History of Present Illness 82 y.o. female admitted with syncope, fall, R proximal humerus fx, UTI. PMH of CLL, neuropathy   Clinical Impression   Patient presents to OT with decreased ADL independence and safety. Patient reports she is transferring to SNF today. All further OT needs can be met at [facility]. Will sign off.    Follow Up Recommendations  SNF    Equipment Recommendations   (tbd next venue of care)    Recommendations for Other Services       Precautions / Restrictions Precautions Precautions: Fall Precaution Comments: fell in July (tripped), then 1 syncopal event with fall just prior to admission Required Braces or Orthoses: Sling Restrictions Weight Bearing Restrictions: Yes RUE Weight Bearing: Non weight bearing Other Position/Activity Restrictions: assuming RUE NWB (ortho consult pending)      Mobility Bed Mobility Overal bed mobility: Needs Assistance Bed Mobility: Supine to Sit     Supine to sit: Mod assist     General bed mobility comments: mod A to raise trunk and pad used to pivot hips to EOB  Transfers Overall transfer level: Needs assistance Equipment used: 1 person hand held assist Transfers: Sit to/from [facility] Sit to Stand: Min assist Stand pivot transfers: Min assist       General transfer comment: min A to rise/steady    Balance                                            ADL Overall ADL's : Needs assistance/impaired Eating/Feeding: Minimal assistance;Bed level   Grooming: Minimal assistance;Sitting           Upper Body Dressing : Maximal assistance;Total assistance;Sitting   Lower Body Dressing: Maximal assistance Lower Body Dressing Details (indicate cue type and reason): don/doff socks Toilet Transfer: Minimal assistance;Stand-pivot;BSC   Toileting-  Clothing Manipulation and Hygiene: Maximal assistance;Sit to/from stand       Functional mobility during ADLs: Minimal assistance;Moderate assistance       Vision     Perception     Praxis      Pertinent Vitals/Pain Pain Assessment: 0-10 Pain Score: 5  Pain Location: R shoulder Pain Descriptors / Indicators: Aching;Sore Pain Intervention(s): Limited activity within patient's tolerance;Monitored during session     Hand Dominance Right   Extremity/Trunk Assessment Upper Extremity Assessment Upper Extremity Assessment: RUE deficits/detail RUE Deficits / Details: shoulder in sling; very limited elbow flexion due to pain; able to perform wrist flex/ext and digit/thumb flex/ext. Sensation and grip intact. RUE: Unable to fully assess due to immobilization;Unable to fully assess due to pain   Lower Extremity Assessment Lower Extremity Assessment: Defer to PT evaluation   Cervical / Trunk Assessment Cervical / Trunk Assessment: Normal   Communication Communication Communication: No difficulties   Cognition Arousal/Alertness: Awake/alert Behavior During Therapy: WFL for tasks assessed/performed Overall Cognitive Status: Within Functional Limits for tasks assessed                     General Comments       Exercises       Shoulder Instructions      Home Living Family/patient expects to be discharged to:: Private residence Living Arrangements: Alone Available Help at Discharge: Friend(s);Available PRN/intermittently   Home Access: Stairs to enter  Entrance Stairs-Number of Steps: 4 Entrance Stairs-Rails: Left;Right Home Layout: One level     Bathroom Shower/Tub: Occupational psychologist: Standard     Home Equipment: Environmental consultant - 2 wheels;Cane - single point;Bedside commode;Shower seat - built in          Prior Functioning/Environment Level of Independence: Independent with assistive device(s)        Comments: drives short distances,  friends drive her longer distances, has housekeeper         OT Problem List: Decreased strength;Decreased range of motion;Decreased activity tolerance;Impaired vision/perception;Decreased knowledge of use of DME or AE;Decreased knowledge of precautions;Pain;Impaired UE functional use   OT Treatment/Interventions:      OT Goals(Current goals can be found in the care plan section) Acute Rehab OT Goals Patient Stated Goal: rehab prior to home; reports she is leaving today OT Goal Formulation: All assessment and education complete, DC therapy  OT Frequency:     Barriers to D/C:            Co-evaluation              End of Session Equipment Utilized During Treatment: Other (comment) (sling RUE)  Activity Tolerance: Patient tolerated treatment well Patient left: in chair;with call bell/phone within reach;with chair alarm set   Time: 6578-4696 OT Time Calculation (min): 14 min Charges:  OT General Charges $OT Visit: 1 Procedure OT Evaluation $OT Eval Moderate Complexity: 1 Procedure G-Codes: OT G-codes **NOT FOR INPATIENT CLASS** Functional Assessment Tool Used: clinical judgment Functional Limitation: Self care Self Care Current Status (E9528): At least 60 percent but less than 80 percent impaired, limited or restricted Self Care Goal Status (U1324): At least 20 percent but less than 40 percent impaired, limited or restricted Self Care Discharge Status [phone]): At least 60 percent but less than 80 percent impaired, limited or restricted  Todrick Siedschlag A 05/11/2016, 1:58 PM

## 2016-05-11 NOTE — Progress Notes (Signed)
  Echocardiogram 2D Echocardiogram has been performed.  Tresa Res 05/11/2016, 1:20 PM

## 2016-05-11 NOTE — Progress Notes (Signed)
Agree with earlier shift assessment, no changes.

## 2016-05-11 NOTE — Progress Notes (Signed)
Pt discharged to facility via [place].  Report called to Bonnielee Haff at [facility].

## 2016-05-11 NOTE — Discharge Summary (Signed)
[facility]  Physician Discharge Summary   Patient ID: [name] MRN: [id number] DOB/AGE: Jan 17, 1926 82 y.o.  Admit date: 05/09/2016 Discharge date: 05/11/2016  PCP: Walker Kehr, MD  DISCHARGE DIAGNOSES:  Principal Problem:   Syncope and collapse Active Problems:   Peripheral neuropathy ([place])   Essential hypertension   Dehydration   Closed displaced simple supracondylar fracture of right humerus without intercondylar fracture   Urinary tract infection without hematuria   Syncope   RECOMMENDATIONS FOR OUTPATIENT FOLLOW UP: CBC and basic metabolic panel in 1 week.  Patient will need to follow-up with Dr. Berenice Primas with orthopedic surgery in one week for her right shoulder fracture.  Report of the echocardiogram will be pending.  Leave the right arm in a sling for now. Patient will also need to follow-up with her dentist for her tooth pain. Will need follow-up with PCP for further evaluation of pulmonary nodule detected incidentally on imaging films.   DISCHARGE CONDITION: fair  Diet recommendation: Regular  Filed Weights   05/09/16 1943 05/11/16 0650  Weight: 63.5 kg (140 lb) 63.4 kg (139 lb 11.2 oz)    INITIAL HISTORY: 82 year old Caucasian female with the past medical history of CLL, hypo-gammaglobulinemia, B-12 deficiency, neuropathy, hypertension and hyperlipidemia presented after sustaining a syncopal event at home. She fell on her right side. She complained of right shoulder pain. Patient was found to have abnormal UA suggesting UTI. She was hospitalized for further management.  Consultations:  Orthopedics  Procedures:  None  HOSPITAL COURSE:   Syncope and collapse secondary to UTI and dehydration, causing fall with acute humerus fracture Syncope was most likely secondary to urinary tract infection. EKG did not show any concerning findings. She does have first-degree AV block, however. Echocardiogram has been ordered. This is being done this  afternoon. Report may not be available prior to discharge and will need to be followed up on. She has been hydrated.  Urinary tract infection Urine cultures grew multiple species. She will be transitioned to oral Vantin. Continue for 7 more days.   Acute on chronic Humerus fracture Patient's arm has been placed in a sling. Seen by Dr. Berenice Primas with orthopedics. No plan for surgical intervention at this time. Plan is for the arm to remain in the sling for a week and to follow-up at his office for repeat films.   Essential hypertension  Continue amlodipine. Hold hydrochlorothiazide as her blood pressures are soft. Tinea to monitor at this clinic nurse in facility.  History of hyperlipidemia  Continue simvastatin  History of peripheral neuropathy Continue home dose of neurontin  Pulmonary nodule, incidental finding on chest xray She will need outpatient CT scan of her chest.   Subacute mouth pain Complains of tongue pain ongoing for the past few weeks. This is being evaluated by her dentist. No obvious lesions noted on her tongue. Continue magic mouthwash.  Overall, stable. Okay for discharge to skilled nursing facility today.    PERTINENT LABS:  The results of significant diagnostics from this hospitalization (including imaging, microbiology, ancillary and laboratory) are listed below for reference.    Microbiology: Recent Results (from the past 240 hour(s))  Urine culture     Status: Abnormal   Collection Time: 05/09/16 10:52 PM  Result Value Ref Range Status   Specimen Description URINE, CLEAN CATCH  Final   Special Requests NONE  Final   Culture MULTIPLE SPECIES PRESENT, SUGGEST RECOLLECTION (A)  Final   Report Status 05/11/2016 FINAL  Final     Labs:  Basic Metabolic Panel:  Recent Labs Lab 05/09/16 2209 05/10/16 0302 05/11/16 0558  NA 134* 133* 135  K 3.6 3.5 3.9  CL 100* 100* 104  CO2 27 26 26   GLUCOSE 152* 144* 99  BUN 19 20 17   CREATININE 0.81 0.73  0.70  CALCIUM 8.9 8.9 8.7*   Liver Function Tests:  Recent Labs Lab 05/09/16 2209  AST 23  ALT 19  ALKPHOS 43  BILITOT 0.9  PROT 7.4  ALBUMIN 3.8   CBC:  Recent Labs Lab 05/09/16 2209 05/11/16 0558  WBC 9.5 6.2  NEUTROABS 7.0  --   HGB 13.0 11.6*  HCT 37.8 34.0*  MCV 89.8 90.9  PLT 143* 120*    CBG:  Recent Labs Lab 05/10/16 0215 05/11/16 0644  GLUCAP 133* 100*     IMAGING STUDIES Dg Chest 2 View  Result Date: 05/09/2016 CLINICAL DATA:  Right anterior chest pain after falling today. EXAM: CHEST  2 VIEW COMPARISON:  02/07/2015 FINDINGS: Heart size is normal. There is atherosclerosis of the aorta. The lungs are clear except for a questionable 1 cm nodule in the right upper lobe. This should be evaluated when able. No effusions or heart failure. There is an acute fracture the proximal right humerus. IMPRESSION: Acute fracture proximal right humerus. Question 1 cm nodule right upper lobe. Followup with repeat radiography and/or CT. Aortic atherosclerosis. Electronically Signed   By: Nelson Chimes M.D.   On: 05/09/2016 22:43   Dg Shoulder Right  Result Date: 05/09/2016 CLINICAL DATA:  Right shoulder and humerus pain after fall EXAM: RIGHT SHOULDER - 2+ VIEW; RIGHT HUMERUS - 2+ VIEW COMPARISON:  02/07/2015 chest radiograph, 06/29/2013 right shoulder radiographs FINDINGS: There is an acute on chronic right surgical neck fracture with one shaft width anterior displacement of the humeral shaft relative to the humeral head. The humeral head maintains its articulation with the glenoid. The acromioclavicular joint and sternoclavicular joint are intact. There is platelike atelectasis and/or scarring in the right upper lobe. No adjacent rib fracture is identified. The scapula appears intact. IMPRESSION: Acute on chronic right surgical neck fracture of the humerus with anterior displacement of the distal fracture fragment one shaft width. Electronically Signed   By: Ashley Royalty M.D.    On: 05/09/2016 20:57   Ct Head Wo Contrast  Result Date: 05/09/2016 CLINICAL DATA:  [place] today.  Arm injury. EXAM: CT HEAD WITHOUT CONTRAST TECHNIQUE: Contiguous axial images were obtained from the base of the skull through the vertex without intravenous contrast. COMPARISON:  03/10/2016 FINDINGS: Brain: There is not accelerated atrophy for age. There are mild chronic small-vessel changes of the white matter. No sign of acute infarction, mass lesion, hemorrhage, hydrocephalus or extra-axial collection. Vascular: There is atherosclerotic calcification of the major vessels at the base of the brain. Skull: No skull fracture Sinuses/Orbits: Chronic inflammatory changes in the right division of the sphenoid sinus. Other sinuses are clear. No orbital abnormality seen. Other: None IMPRESSION: No acute intracranial finding. Mild age related atrophy and chronic small vessel change, less than often seen at this age. Chronic inflammatory changes of the right division of the sphenoid sinus. Electronically Signed   By: Nelson Chimes M.D.   On: 05/09/2016 22:45   Ct Cervical Spine Wo Contrast  Result Date: 05/09/2016 CLINICAL DATA:  [place] today EXAM: CT CERVICAL SPINE WITHOUT CONTRAST TECHNIQUE: Multidetector CT imaging of the cervical spine was performed without intravenous contrast. Multiplanar CT image reconstructions were also generated. COMPARISON:  None. FINDINGS: Alignment: Slight  straightening of the normal lordosis. No traumatic malalignment. Skull base and vertebrae: No fracture or primary bone lesion. Soft tissues and spinal canal: No soft tissue injury. Disc levels: Degenerative spondylosis from C3-4 through C6-7. Mild osteophytic encroachment upon the foramina. Facet osteoarthritis from C2-3 through C4-5. Upper chest: Normal. The scan does not quite reach the area of concern at plain radiography. Other: None IMPRESSION: No acute or traumatic finding. Chronic degenerative spondylosis and facet osteoarthritis.  Electronically Signed   By: Nelson Chimes M.D.   On: 05/09/2016 22:48   Dg Humerus Right  Result Date: 05/09/2016 CLINICAL DATA:  Right shoulder and humerus pain after fall EXAM: RIGHT SHOULDER - 2+ VIEW; RIGHT HUMERUS - 2+ VIEW COMPARISON:  02/07/2015 chest radiograph, 06/29/2013 right shoulder radiographs FINDINGS: There is an acute on chronic right surgical neck fracture with one shaft width anterior displacement of the humeral shaft relative to the humeral head. The humeral head maintains its articulation with the glenoid. The acromioclavicular joint and sternoclavicular joint are intact. There is platelike atelectasis and/or scarring in the right upper lobe. No adjacent rib fracture is identified. The scapula appears intact. IMPRESSION: Acute on chronic right surgical neck fracture of the humerus with anterior displacement of the distal fracture fragment one shaft width. Electronically Signed   By: Ashley Royalty M.D.   On: 05/09/2016 20:57    DISCHARGE EXAMINATION: Vitals:   05/10/16 1436 05/10/16 2020 05/11/16 0510 05/11/16 0650  BP: (!) 103/50 (!) 103/58 123/71   Pulse: 66 69 81   Resp: 18 18 17    Temp: 98 F (36.7 C) 98.6 F (37 C) 98.6 F (37 C)   TempSrc: Oral Oral Oral   SpO2: 97% 95% 95%   Weight:    63.4 kg (139 lb 11.2 oz)  Height:       General appearance: alert, cooperative, appears stated age and no distress Resp: clear to auscultation bilaterally Cardio: regular rate and rhythm, S1, S2 normal, no murmur, click, rub or gallop GI: soft, non-tender; bowel sounds normal; no masses,  no organomegaly Extremities: Right arm is in a sling  DISPOSITION: SNF  Discharge Instructions    Call MD for:  difficulty breathing, headache or visual disturbances    Complete by:  As directed    Call MD for:  extreme fatigue    Complete by:  As directed    Call MD for:  persistant dizziness or light-headedness    Complete by:  As directed    Call MD for:  persistant nausea and vomiting     Complete by:  As directed    Call MD for:  severe uncontrolled pain    Complete by:  As directed    Call MD for:  temperature >100.4    Complete by:  As directed    Diet general    Complete by:  As directed    Discharge instructions    Complete by:  As directed    CBC and basic metabolic panel in 1 week. Patient will need to follow-up with Dr. Berenice Primas with orthopedic surgery in one week for her right shoulder fracture. Report of the echocardiogram will be pending. Leave the right arm in a sling for now.  You were cared for by a hospitalist during your hospital stay. If you have any questions about your discharge medications or the care you received while you were in the hospital after you are discharged, you can call the unit and asked to speak with the hospitalist on call  if the hospitalist that took care of you is not available. Once you are discharged, your primary care physician will handle any further medical issues. Please note that NO REFILLS for any discharge medications will be authorized once you are discharged, as it is imperative that you return to your primary care physician (or establish a relationship with a primary care physician if you do not have one) for your aftercare needs so that they can reassess your need for medications and monitor your lab values. If you do not have a primary care physician, you can call [phone] for a physician referral.   Increase activity slowly    Complete by:  As directed       ALLERGIES:  Allergies  Allergen Reactions  . Pneumococcal Vaccines Swelling and Rash    Pneumococcal Vaccine-23 valent  . Clarithromycin Other (See Comments)    REACTION: sore mouth  . Hctz [Hydrochlorothiazide]     Low Na  . Oxycodone-Aspirin Other (See Comments)    REACTION: horrible nightmares  . Propoxyphene N-Acetaminophen Other (See Comments)    REACTION: couldn't wake her up     Current Discharge Medication List    START taking these medications    Details  cefpodoxime (VANTIN) 200 MG tablet Take 1 tablet (200 mg total) by mouth every 12 (twelve) hours. For 7 more days    HYDROcodone-acetaminophen (NORCO/VICODIN) 5-325 MG tablet Take 1-2 tablets by mouth every 6 (six) hours as needed for moderate pain or severe pain. Qty: 30 tablet, Refills: 0      CONTINUE these medications which have NOT CHANGED   Details  acetaminophen (TYLENOL) 500 MG tablet Take 500 mg by mouth 2 (two) times daily.     amLODipine (NORVASC) 5 MG tablet TAKE 1 TABLET (5 MG TOTAL) BY MOUTH DAILY. Qty: 30 tablet, Refills: 11    aspirin 81 MG EC tablet Take 81 mg by mouth daily.      Cholecalciferol (EQL VITAMIN D3) 1000 UNITS tablet Take 1,000 Units by mouth daily.      conjugated estrogens (PREMARIN) vaginal cream 1/2 gram vaginally every night x 1 week, then twice weekly at bedtime Qty: 90 g, Refills: 3    cromolyn (OPTICROM) 4 % ophthalmic solution 1 DROP IN BOTH EYES FOUR TIMES A DAY Refills: 12    cyanocobalamin (,VITAMIN B-12,) 1000 MCG/ML injection ADMINISTER 1 CC SUBQ EVERY 4 WEEKS AS DIRECTED Qty: 10 mL, Refills: 1    Diphenhyd-Hydrocort-Nystatin (FIRST-DUKES MOUTHWASH) SUSP Use as directed 5 mLs in the mouth or throat 3 (three) times daily before meals. Qty: 120 mL, Refills: 0   Associated Diagnoses: Burning tongue    fluticasone (FLONASE) 50 MCG/ACT nasal spray Place 2 sprays into both nostrils daily. Qty: 16 g, Refills: 0   Associated Diagnoses: Nasal sinus congestion    folic acid (FOLVITE) 1 MG tablet Take 1 mg by mouth daily. For restless legs   Associated Diagnoses: Chronic lymphoid leukemia, without mention of having achieved remission(204.10)    gabapentin (NEURONTIN) 300 MG capsule TAKE ONE CAPSULE BY MOUTH EVERY DAY Qty: 90 capsule, Refills: 2    KLOR-CON M10 10 MEQ tablet TAKE 1 TABLET (10 MEQ TOTAL) BY MOUTH DAILY. Qty: 90 tablet, Refills: 3    latanoprost (XALATAN) 0.005 % ophthalmic solution Place 1 drop into both eyes at  bedtime. Qty: 2.5 mL, Refills: 5   Associated Diagnoses: Chronic lymphocytic leukemia (HCC)    loratadine (CLARITIN) 10 MG tablet Take 1 tablet (10 mg total) by mouth daily.  Qty: 90 tablet, Refills: 3    pantoprazole (PROTONIX) 40 MG tablet Take 1 tablet (40 mg total) by mouth daily. Qty: 90 tablet, Refills: 3    simvastatin (ZOCOR) 20 MG tablet TAKE 1 TABLET (20 MG TOTAL) BY MOUTH DAILY. Qty: 90 tablet, Refills: 2    B-D 3CC LUER-LOK SYR 25GX1" 25G X 1" 3 ML MISC USE TO ADMINISTER B12 INJECTIONS Qty: 10 each, Refills: 2    B-D INS SYR ULTRAFINE 1CC/30G 30G X 1/2" 1 ML MISC USE TO ADMINISTER B12 INJECTIONS Qty: 10 each, Refills: 1    Insulin Syringe-Needle U-100 (B-D INS SYRINGE 0.5CC/30GX1/2") 30G X 1/2" 0.5 ML MISC by Does not apply route. To administer B12 injections       STOP taking these medications     cetirizine (ZYRTEC) 10 MG tablet      hydrochlorothiazide (MICROZIDE) 12.5 MG capsule          Follow-up Information    GRAVES,JOHN L, MD Follow up in 1 week(s).   Specialty:  Orthopedic Surgery Contact information: [place] [address] [phone]        Walker Kehr, MD Follow up in 2 week(s).   Specialty:  Internal Medicine Contact information: [place] [address] [phone]           TOTAL DISCHARGE TIME: 100 minutes  [facility] Pager [phone]  05/11/2016, 12:20 PM

## 2016-05-11 NOTE — Consult Note (Signed)
Reason for Consult:Status post fall with proximal humerus fracture right Referring Physician: Hospitalists  [name] is an 82 y.o. female.  HPI: The patient is an 82 year old female whose had a previous proximal humerus fracture which we have managed at our office.  She currently has had a fall and has a new proximal humerus fracture.  We are consult for management.  She denies numbness tingling or radiating pain into the hand.  Past Medical History:  Diagnosis Date  . Allergy    rhinitis  . Chronic ethmoidal sinusitis   . Diverticulosis of colon   . Glaucoma   . Hx of colonic polyps   . Hyperlipidemia   . Hypertension   . Hypogammaglobulinemia ([place])    Monthly IVIG  . Leukemia ([place])    Chronic lymphocytic leukemia  . Osteoarthritis   . Peripheral neuropathy ([place])   . Pernicious anemia   . UTI (lower urinary tract infection) 2010  . Vitamin B12 deficiency     Past Surgical History:  Procedure Laterality Date  . ABDOMINAL HYSTERECTOMY  1987  . bilateral cataracts  2007  . bladder tack  1987  . L4-L5 Laminetomy  1999  . TUBAL LIGATION    . tubaligation  1961  . ureter reconstruction  1992    Family History  Problem Relation Age of Onset  . Jaundice Father   . Stroke Son 75    in NH  . Cancer Mother     uterine  . Diabetes Mother   . Other Sister     brain tumor  . Cancer Sister   . Hypertension Other   . Cancer Other     breast  . Thyroid disease Sister   . Cancer Sister     thyroid cancer    Social History:  reports that she has never smoked. She has never used smokeless tobacco. She reports that she does not drink alcohol or use drugs.  Allergies:  Allergies  Allergen Reactions  . Pneumococcal Vaccines Swelling and Rash    Pneumococcal Vaccine-23 valent  . Clarithromycin Other (See Comments)    REACTION: sore mouth  . Hctz [Hydrochlorothiazide]     Low Na  . Oxycodone-Aspirin Other (See Comments)    REACTION: horrible nightmares  .  Propoxyphene N-Acetaminophen Other (See Comments)    REACTION: couldn't wake her up    Medications: I have reviewed the patient's current medications.  Results for orders placed or performed during the hospital encounter of 05/09/16 (from the past 48 hour(s))  CBC with Differential/Platelet     Status: Abnormal   Collection Time: 05/09/16 10:09 PM  Result Value Ref Range   WBC 9.5 4.0 - 10.5 K/uL   RBC 4.21 3.87 - 5.11 MIL/uL   Hemoglobin 13.0 12.0 - 15.0 g/dL   HCT 37.8 36.0 - 46.0 %   MCV 89.8 78.0 - 100.0 fL   MCH 30.9 26.0 - 34.0 pg   MCHC 34.4 30.0 - 36.0 g/dL   RDW 14.1 11.5 - 15.5 %   Platelets 143 (L) 150 - 400 K/uL   Neutrophils Relative % 73 %   Neutro Abs 7.0 1.7 - 7.7 K/uL   Lymphocytes Relative 20 %   Lymphs Abs 1.9 0.7 - 4.0 K/uL   Monocytes Relative 7 %   Monocytes Absolute 0.7 0.1 - 1.0 K/uL   Eosinophils Relative 0 %   Eosinophils Absolute 0.0 0.0 - 0.7 K/uL   Basophils Relative 0 %   Basophils Absolute 0.0 0.0 -  0.1 K/uL  Comprehensive metabolic panel     Status: Abnormal   Collection Time: 05/09/16 10:09 PM  Result Value Ref Range   Sodium 134 (L) 135 - 145 mmol/L   Potassium 3.6 3.5 - 5.1 mmol/L   Chloride 100 (L) 101 - 111 mmol/L   CO2 27 22 - 32 mmol/L   Glucose, Bld 152 (H) 65 - 99 mg/dL   BUN 19 6 - 20 mg/dL   Creatinine, Ser 0.81 0.44 - 1.00 mg/dL   Calcium 8.9 8.9 - 10.3 mg/dL   Total Protein 7.4 6.5 - 8.1 g/dL   Albumin 3.8 3.5 - 5.0 g/dL   AST 23 15 - 41 U/L   ALT 19 14 - 54 U/L   Alkaline Phosphatase 43 38 - 126 U/L   Total Bilirubin 0.9 0.3 - 1.2 mg/dL   GFR calc non Af Amer >60 >60 mL/min   GFR calc Af Amer >60 >60 mL/min    Comment: (NOTE) The eGFR has been calculated using the CKD EPI equation. This calculation has not been validated in all clinical situations. eGFR's persistently <60 mL/min signify possible Chronic Kidney Disease.    Anion gap 7 5 - 15  I-stat troponin, ED     Status: None   Collection Time: 05/09/16 10:18 PM   Result Value Ref Range   Troponin i, poc 0.00 0.00 - 0.08 ng/mL   Comment 3            Comment: Due to the release kinetics of cTnI, a negative result within the first hours of the onset of symptoms does not rule out myocardial infarction with certainty. If myocardial infarction is still suspected, repeat the test at appropriate intervals.   Urinalysis, Routine w reflex microscopic (not at [facility])     Status: Abnormal   Collection Time: 05/09/16 10:52 PM  Result Value Ref Range   Color, Urine ORANGE (A) YELLOW    Comment: BIOCHEMICALS MAY BE AFFECTED BY COLOR   APPearance CLOUDY (A) CLEAR   Specific Gravity, Urine 1.020 1.005 - 1.030   pH 6.0 5.0 - 8.0   Glucose, UA NEGATIVE NEGATIVE mg/dL   Hgb urine dipstick NEGATIVE NEGATIVE   Bilirubin Urine NEGATIVE NEGATIVE   Ketones, ur 15 (A) NEGATIVE mg/dL   Protein, ur NEGATIVE NEGATIVE mg/dL   Nitrite POSITIVE (A) NEGATIVE   Leukocytes, UA SMALL (A) NEGATIVE  Urine microscopic-add on     Status: Abnormal   Collection Time: 05/09/16 10:52 PM  Result Value Ref Range   Squamous Epithelial / LPF 0-5 (A) NONE SEEN   WBC, UA 6-30 0 - 5 WBC/hpf   RBC / HPF 0-5 0 - 5 RBC/hpf   Bacteria, UA FEW (A) NONE SEEN  Glucose, capillary     Status: Abnormal   Collection Time: 05/10/16  2:15 AM  Result Value Ref Range   Glucose-Capillary 133 (H) 65 - 99 mg/dL  Protime-INR     Status: None   Collection Time: 05/10/16  3:02 AM  Result Value Ref Range   Prothrombin Time 13.8 11.4 - 15.2 seconds   INR 1.05   APTT     Status: None   Collection Time: 05/10/16  3:02 AM  Result Value Ref Range   aPTT 26 24 - 36 seconds  Basic metabolic panel     Status: Abnormal   Collection Time: 05/10/16  3:02 AM  Result Value Ref Range   Sodium 133 (L) 135 - 145 mmol/L   Potassium 3.5 3.5 -  5.1 mmol/L   Chloride 100 (L) 101 - 111 mmol/L   CO2 26 22 - 32 mmol/L   Glucose, Bld 144 (H) 65 - 99 mg/dL   BUN 20 6 - 20 mg/dL   Creatinine, Ser 0.73 0.44 - 1.00 mg/dL    Calcium 8.9 8.9 - 10.3 mg/dL   GFR calc non Af Amer >60 >60 mL/min   GFR calc Af Amer >60 >60 mL/min    Comment: (NOTE) The eGFR has been calculated using the CKD EPI equation. This calculation has not been validated in all clinical situations. eGFR's persistently <60 mL/min signify possible Chronic Kidney Disease.    Anion gap 7 5 - 15  CBC     Status: Abnormal   Collection Time: 05/11/16  5:58 AM  Result Value Ref Range   WBC 6.2 4.0 - 10.5 K/uL   RBC 3.74 (L) 3.87 - 5.11 MIL/uL   Hemoglobin 11.6 (L) 12.0 - 15.0 g/dL   HCT 34.0 (L) 36.0 - 46.0 %   MCV 90.9 78.0 - 100.0 fL   MCH 31.0 26.0 - 34.0 pg   MCHC 34.1 30.0 - 36.0 g/dL   RDW 14.6 11.5 - 15.5 %   Platelets 120 (L) 150 - 400 K/uL  Basic metabolic panel     Status: Abnormal   Collection Time: 05/11/16  5:58 AM  Result Value Ref Range   Sodium 135 135 - 145 mmol/L   Potassium 3.9 3.5 - 5.1 mmol/L   Chloride 104 101 - 111 mmol/L   CO2 26 22 - 32 mmol/L   Glucose, Bld 99 65 - 99 mg/dL   BUN 17 6 - 20 mg/dL   Creatinine, Ser 0.70 0.44 - 1.00 mg/dL   Calcium 8.7 (L) 8.9 - 10.3 mg/dL   GFR calc non Af Amer >60 >60 mL/min   GFR calc Af Amer >60 >60 mL/min    Comment: (NOTE) The eGFR has been calculated using the CKD EPI equation. This calculation has not been validated in all clinical situations. eGFR's persistently <60 mL/min signify possible Chronic Kidney Disease.    Anion gap 5 5 - 15  Glucose, capillary     Status: Abnormal   Collection Time: 05/11/16  6:44 AM  Result Value Ref Range   Glucose-Capillary 100 (H) 65 - 99 mg/dL   Comment 1 Notify RN    Comment 2 Document in Chart     Dg Chest 2 View  Result Date: 05/09/2016 CLINICAL DATA:  Right anterior chest pain after falling today. EXAM: CHEST  2 VIEW COMPARISON:  02/07/2015 FINDINGS: Heart size is normal. There is atherosclerosis of the aorta. The lungs are clear except for a questionable 1 cm nodule in the right upper lobe. This should be evaluated when  able. No effusions or heart failure. There is an acute fracture the proximal right humerus. IMPRESSION: Acute fracture proximal right humerus. Question 1 cm nodule right upper lobe. Followup with repeat radiography and/or CT. Aortic atherosclerosis. Electronically Signed   By: Nelson Chimes M.D.   On: 05/09/2016 22:43   Dg Shoulder Right  Result Date: 05/09/2016 CLINICAL DATA:  Right shoulder and humerus pain after fall EXAM: RIGHT SHOULDER - 2+ VIEW; RIGHT HUMERUS - 2+ VIEW COMPARISON:  02/07/2015 chest radiograph, 06/29/2013 right shoulder radiographs FINDINGS: There is an acute on chronic right surgical neck fracture with one shaft width anterior displacement of the humeral shaft relative to the humeral head. The humeral head maintains its articulation with the glenoid. The acromioclavicular  joint and sternoclavicular joint are intact. There is platelike atelectasis and/or scarring in the right upper lobe. No adjacent rib fracture is identified. The scapula appears intact. IMPRESSION: Acute on chronic right surgical neck fracture of the humerus with anterior displacement of the distal fracture fragment one shaft width. Electronically Signed   By: Ashley Royalty M.D.   On: 05/09/2016 20:57   Ct Head Wo Contrast  Result Date: 05/09/2016 CLINICAL DATA:  [place] today.  Arm injury. EXAM: CT HEAD WITHOUT CONTRAST TECHNIQUE: Contiguous axial images were obtained from the base of the skull through the vertex without intravenous contrast. COMPARISON:  03/10/2016 FINDINGS: Brain: There is not accelerated atrophy for age. There are mild chronic small-vessel changes of the white matter. No sign of acute infarction, mass lesion, hemorrhage, hydrocephalus or extra-axial collection. Vascular: There is atherosclerotic calcification of the major vessels at the base of the brain. Skull: No skull fracture Sinuses/Orbits: Chronic inflammatory changes in the right division of the sphenoid sinus. Other sinuses are clear. No  orbital abnormality seen. Other: None IMPRESSION: No acute intracranial finding. Mild age related atrophy and chronic small vessel change, less than often seen at this age. Chronic inflammatory changes of the right division of the sphenoid sinus. Electronically Signed   By: Nelson Chimes M.D.   On: 05/09/2016 22:45   Ct Cervical Spine Wo Contrast  Result Date: 05/09/2016 CLINICAL DATA:  [place] today EXAM: CT CERVICAL SPINE WITHOUT CONTRAST TECHNIQUE: Multidetector CT imaging of the cervical spine was performed without intravenous contrast. Multiplanar CT image reconstructions were also generated. COMPARISON:  None. FINDINGS: Alignment: Slight straightening of the normal lordosis. No traumatic malalignment. Skull base and vertebrae: No fracture or primary bone lesion. Soft tissues and spinal canal: No soft tissue injury. Disc levels: Degenerative spondylosis from C3-4 through C6-7. Mild osteophytic encroachment upon the foramina. Facet osteoarthritis from C2-3 through C4-5. Upper chest: Normal. The scan does not quite reach the area of concern at plain radiography. Other: None IMPRESSION: No acute or traumatic finding. Chronic degenerative spondylosis and facet osteoarthritis. Electronically Signed   By: Nelson Chimes M.D.   On: 05/09/2016 22:48   Dg Humerus Right  Result Date: 05/09/2016 CLINICAL DATA:  Right shoulder and humerus pain after fall EXAM: RIGHT SHOULDER - 2+ VIEW; RIGHT HUMERUS - 2+ VIEW COMPARISON:  02/07/2015 chest radiograph, 06/29/2013 right shoulder radiographs FINDINGS: There is an acute on chronic right surgical neck fracture with one shaft width anterior displacement of the humeral shaft relative to the humeral head. The humeral head maintains its articulation with the glenoid. The acromioclavicular joint and sternoclavicular joint are intact. There is platelike atelectasis and/or scarring in the right upper lobe. No adjacent rib fracture is identified. The scapula appears intact.  IMPRESSION: Acute on chronic right surgical neck fracture of the humerus with anterior displacement of the distal fracture fragment one shaft width. Electronically Signed   By: Ashley Royalty M.D.   On: 05/09/2016 20:57    ROS  ROS: I have reviewed the patient's review of systems thoroughly and there are no positive responses as relates to the HPI. Blood pressure 123/71, pulse 81, temperature 98.6 F (37 C), temperature source Oral, resp. rate 17, height 5' 4"  (1.626 m), weight 63.4 kg (139 lb 11.2 oz), SpO2 95 %. Physical Exam Well-developed well-nourished patient in no acute distress. Alert and oriented x3 HEENT:within normal limits Cardiac: Regular rate and rhythm Pulmonary: Lungs clear to auscultation Abdomen: Soft and nontender.  Normal active bowel sounds  Musculoskeletal: (Right  arm.  Painful range of motion.  Limited range of motion.  Currently and sling.  Neurovascular intact distally. Assessment/Plan: 82 year old female with  Proximal humerus fracture in the setting of previous proximal humerus fracture which we treated nonoperatively.//At this point the appropriate course of action will be treatment in a sling.  We will manage her as an outpatient.  I think it is highly unlikely that she would need to go on to any surgical intervention.  From our standpoint she can be discharged any time and we will see her in the office in 1weekFor repeat x-ray.  Danelia Snodgrass L 05/11/2016, 8:16 AM

## 2016-05-11 NOTE — Discharge Instructions (Addendum)
Keep arm in sling but come out several times a day for pendulum exercises.   Humerus Fracture Treated With Immobilization The humerus is the large bone in your upper arm. You have a broken (fractured) humerus. These fractures are easily diagnosed with X-rays. TREATMENT  Simple fractures which will heal without disability are treated with simple immobilization. Immobilization means you will wear a cast, splint, or sling. You have a fracture which will do well with immobilization. The fracture will heal well simply by being held in a good position until it is stable enough to begin range of motion exercises. Do not take part in activities which would further injure your arm.  HOME CARE INSTRUCTIONS   Put ice on the injured area.  Put ice in a plastic bag.  Place a towel between your skin and the bag.  Leave the ice on for 15-20 minutes, 03-04 times a day.  If you have a cast:  Do not scratch the skin under the cast using sharp or pointed objects.  Check the skin around the cast every day. You may put lotion on any red or sore areas.  Keep your cast dry and clean.  If you have a splint:  Wear the splint as directed.  Keep your splint dry and clean.  You may loosen the elastic around the splint if your fingers become numb, tingle, or turn cold or blue.  If you have a sling:  Wear the sling as directed.  Do not put pressure on any part of your cast or splint until it is fully hardened.  Your cast or splint can be protected during bathing with a plastic bag. Do not lower the cast or splint into water.  Only take over-the-counter or prescription medicines for pain, discomfort, or fever as directed by your caregiver.  Do range of motion exercises as instructed by your caregiver.  Follow up as directed by your caregiver. This is very important in order to avoid permanent injury or disability and chronic pain. SEEK IMMEDIATE MEDICAL CARE IF:   Your skin or nails in the injured  arm turn blue or gray.  Your arm feels cold or numb.  You develop severe pain in the injured arm.  You are having problems with the medicines you were given. MAKE SURE YOU:   Understand these instructions.  Will watch your condition.  Will get help right away if you are not doing well or get worse.   This information is not intended to replace advice given to you by your health care provider. Make sure you discuss any questions you have with your health care provider.   Document Released: 10/16/2000 Document Revised: 07/31/2014 Document Reviewed: 12/02/2014 Elsevier Interactive Patient Education 2016 Elsevier Inc[employer]    Urinary Tract Infection Urinary tract infections (UTIs) can develop anywhere along your urinary tract. Your urinary tract is your body's drainage system for removing wastes and extra water. Your urinary tract includes two kidneys, two ureters, a bladder, and a urethra. Your kidneys are a pair of bean-shaped organs. Each kidney is about the size of your fist. They are located below your ribs, one on each side of your spine. CAUSES Infections are caused by microbes, which are microscopic organisms, including fungi, viruses, and bacteria. These organisms are so small that they can only be seen through a microscope. Bacteria are the microbes that most commonly cause UTIs. SYMPTOMS  Symptoms of UTIs may vary by age and gender of the patient and by the location of the  infection. Symptoms in young women typically include a frequent and intense urge to urinate and a painful, burning feeling in the bladder or urethra during urination. Older women and men are more likely to be tired, shaky, and weak and have muscle aches and abdominal pain. A fever may mean the infection is in your kidneys. Other symptoms of a kidney infection include pain in your back or sides below the ribs, nausea, and vomiting. DIAGNOSIS To diagnose a UTI, your caregiver will ask you about your symptoms. Your  caregiver will also ask you to provide a urine sample. The urine sample will be tested for bacteria and white blood cells. White blood cells are made by your body to help fight infection. TREATMENT  Typically, UTIs can be treated with medication. Because most UTIs are caused by a bacterial infection, they usually can be treated with the use of antibiotics. The choice of antibiotic and length of treatment depend on your symptoms and the type of bacteria causing your infection. HOME CARE INSTRUCTIONS  If you were prescribed antibiotics, take them exactly as your caregiver instructs you. Finish the medication even if you feel better after you have only taken some of the medication.  Drink enough water and fluids to keep your urine clear or pale yellow.  Avoid caffeine, tea, and carbonated beverages. They tend to irritate your bladder.  Empty your bladder often. Avoid holding urine for long periods of time.  Empty your bladder before and after sexual intercourse.  After a bowel movement, women should cleanse from front to back. Use each tissue only once. SEEK MEDICAL CARE IF:   You have back pain.  You develop a fever.  Your symptoms do not begin to resolve within 3 days. SEEK IMMEDIATE MEDICAL CARE IF:   You have severe back pain or lower abdominal pain.  You develop chills.  You have nausea or vomiting.  You have continued burning or discomfort with urination. MAKE SURE YOU:   Understand these instructions.  Will watch your condition.  Will get help right away if you are not doing well or get worse.   This information is not intended to replace advice given to you by your health care provider. Make sure you discuss any questions you have with your health care provider.   Document Released: 04/19/2005 Document Revised: 03/31/2015 Document Reviewed: 08/18/2011 Elsevier Interactive Patient Education [employer].

## 2016-05-11 NOTE — Progress Notes (Signed)
Date: May 11, 2016 Discharge orders checked for needs. No case management needs present at time of discharge. Velva Harman, RN, BSN, [place]   [phone]

## 2016-05-16 DIAGNOSIS — F419 Anxiety disorder, unspecified: Secondary | ICD-10-CM

## 2016-05-16 DIAGNOSIS — Z9181 History of falling: Secondary | ICD-10-CM

## 2016-05-16 DIAGNOSIS — R2689 Other abnormalities of gait and mobility: Secondary | ICD-10-CM

## 2016-05-16 DIAGNOSIS — F0781 Postconcussional syndrome: Secondary | ICD-10-CM | POA: Diagnosis not present

## 2016-05-16 DIAGNOSIS — I10 Essential (primary) hypertension: Secondary | ICD-10-CM | POA: Diagnosis not present

## 2016-05-16 DIAGNOSIS — G609 Hereditary and idiopathic neuropathy, unspecified: Secondary | ICD-10-CM

## 2016-05-16 DIAGNOSIS — M6281 Muscle weakness (generalized): Secondary | ICD-10-CM | POA: Diagnosis not present

## 2016-05-16 DIAGNOSIS — G44301 Post-traumatic headache, unspecified, intractable: Secondary | ICD-10-CM | POA: Diagnosis not present

## 2016-05-23 ENCOUNTER — Telehealth: Payer: Self-pay | Admitting: *Deleted

## 2016-05-23 NOTE — Telephone Encounter (Signed)
FYI "I am in a rehab center for PT after I fell and broke my shoulder.  What time am I scheduled?  I'll have to get someone to bring me." Called patient providing appointment information.

## 2016-05-30 ENCOUNTER — Other Ambulatory Visit: Payer: Medicare Other

## 2016-05-30 ENCOUNTER — Encounter: Payer: Self-pay | Admitting: Nurse Practitioner

## 2016-05-30 ENCOUNTER — Other Ambulatory Visit (HOSPITAL_BASED_OUTPATIENT_CLINIC_OR_DEPARTMENT_OTHER): Payer: Medicare Other

## 2016-05-30 ENCOUNTER — Ambulatory Visit (HOSPITAL_BASED_OUTPATIENT_CLINIC_OR_DEPARTMENT_OTHER): Payer: Medicare Other | Admitting: Nurse Practitioner

## 2016-05-30 ENCOUNTER — Telehealth: Payer: Self-pay | Admitting: Oncology

## 2016-05-30 ENCOUNTER — Telehealth: Payer: Self-pay | Admitting: *Deleted

## 2016-05-30 ENCOUNTER — Ambulatory Visit (HOSPITAL_COMMUNITY)
Admission: RE | Admit: 2016-05-30 | Discharge: 2016-05-30 | Disposition: A | Discharge status: Home / Self Care | Payer: Medicare Other | Source: Ambulatory Visit | Attending: Nurse Practitioner | Admitting: Nurse Practitioner

## 2016-05-30 ENCOUNTER — Ambulatory Visit: Payer: Medicare Other

## 2016-05-30 ENCOUNTER — Ambulatory Visit (HOSPITAL_BASED_OUTPATIENT_CLINIC_OR_DEPARTMENT_OTHER): Payer: Medicare Other

## 2016-05-30 ENCOUNTER — Ambulatory Visit: Payer: Medicare Other | Admitting: Nurse Practitioner

## 2016-05-30 VITALS — BP 128/67 | HR 70 | Temp 98.2°F | Resp 17

## 2016-05-30 VITALS — BP 122/67 | HR 75 | Temp 98.1°F | Resp 18 | Ht 64.0 in | Wt 141.9 lb

## 2016-05-30 DIAGNOSIS — C9110 Chronic lymphocytic leukemia of B-cell type not having achieved remission: Secondary | ICD-10-CM

## 2016-05-30 DIAGNOSIS — R918 Other nonspecific abnormal finding of lung field: Secondary | ICD-10-CM | POA: Diagnosis not present

## 2016-05-30 DIAGNOSIS — D801 Nonfamilial hypogammaglobulinemia: Secondary | ICD-10-CM | POA: Diagnosis not present

## 2016-05-30 DIAGNOSIS — R350 Frequency of micturition: Secondary | ICD-10-CM | POA: Diagnosis not present

## 2016-05-30 LAB — URINALYSIS, MICROSCOPIC - CHCC
Bilirubin (Urine): NEGATIVE
Glucose: NEGATIVE mg/dL
KETONES: NEGATIVE mg/dL
LEUKOCYTE ESTERASE: NEGATIVE
Nitrite: NEGATIVE
Protein: NEGATIVE mg/dL
SPECIFIC GRAVITY, URINE: 1.015 (ref 1.003–1.035)
UROBILINOGEN UR: 0.2 mg/dL (ref 0.2–1)
pH: 6 (ref 4.6–8.0)

## 2016-05-30 MED ORDER — IMMUNE GLOBULIN (HUMAN) 10 GM/100ML IV SOLN
30.0000 g | Freq: Once | INTRAVENOUS | Status: AC
  Administered 2016-05-30: 30 g via INTRAVENOUS
  Filled 2016-05-30: qty 100

## 2016-05-30 MED ORDER — DIPHENHYDRAMINE HCL 25 MG PO CAPS
25.0000 mg | ORAL_CAPSULE | Freq: Once | ORAL | Status: AC
  Administered 2016-05-30: 25 mg via ORAL

## 2016-05-30 MED ORDER — DIPHENHYDRAMINE HCL 25 MG PO CAPS
ORAL_CAPSULE | ORAL | Status: AC
  Filled 2016-05-30: qty 1

## 2016-05-30 NOTE — Patient Instructions (Signed)

## 2016-05-30 NOTE — Telephone Encounter (Signed)
Appointments scheduled per 05/30/16 los. AVS report and appointment schedule given to patient ( infusion/michelle), per 05/30/16 los.

## 2016-05-30 NOTE — Telephone Encounter (Signed)
Per LOS I have scheduled appts and notified the scheduler 

## 2016-05-30 NOTE — Progress Notes (Signed)
  [name] OFFICE PROGRESS NOTE   Diagnosis:  CLL  INTERVAL HISTORY:   [name] returns as scheduled. She continues monthly IVIG. She was hospitalized last month following a syncopal event at home. The syncope was felt to most likely be secondary to a urinary tract infection. Chest x-ray questioned a 1 cm nodule in the right upper lobe. Follow up recommended with repeat chest x-ray and/or CT.  She is residing at a nursing facility and participating in physical therapy. She has had urinary frequency over the past 3-4 days. No fevers or sweats. She has a good appetite.  Objective:  Vital signs in last 24 hours:  Blood pressure 122/67, pulse 75, temperature 98.1 F (36.7 C), resp. rate 18, height 5\' 4"  (1.626 m), weight 141 lb 14.4 oz (64.4 kg), SpO2 98 %.    HEENT: No thrush or ulcers. Lymphatics: No palpable cervical, supraclavicular or axillary lymph nodes. Resp: Lungs clear bilaterally. Cardio: Regular rate and rhythm. GI: No organomegaly. Vascular: No leg edema. Skin: Resolving ecchymosis right upper arm.    Lab Results:  Lab Results  Component Value Date   WBC 6.2 05/11/2016   HGB 11.6 (L) 05/11/2016   HCT 34.0 (L) 05/11/2016   MCV 90.9 05/11/2016   PLT 120 (L) 05/11/2016   NEUTROABS 7.0 05/09/2016    Imaging:  No results found.  Medications: I have reviewed the patient's current medications.  Assessment/Plan: 1. Chronic lymphocytic leukemia, asymptomatic. 2. History of hypogammaglobulinemia with recurrent infections. She continues monthly IVIG replacement therapy. 3. History of recurrent urinary tract infections. Coagulase-negative Staphylococcus infection August 2016 4. History of mild thrombocytopenia secondary to CLL. 5. Question right upper lobe nodule on chest x-ray 05/09/2016.   Disposition: [name] appears stable. The plan is to continue monthly IVIG.  She complains of urinary frequency. We will check a urinalysis and culture  today.  Chest x-ray on 05/09/2016 questioned a right upper lobe lung nodule. We will obtain a follow-up chest x-ray today.  She will return for a follow-up visit/immunoglobulin levels in 4 months.  Plan reviewed with Dr. Benay Spice. 25 minutes were spent face-to-face at today's visit with the majority of that time involved in counseling/coordination of care.    Ned Card ANP/GNP-BC   05/30/2016  10:15 AM

## 2016-06-01 ENCOUNTER — Other Ambulatory Visit: Payer: Self-pay | Admitting: Internal Medicine

## 2016-06-01 LAB — URINE CULTURE

## 2016-06-02 ENCOUNTER — Other Ambulatory Visit: Payer: Self-pay | Admitting: Nurse Practitioner

## 2016-06-02 ENCOUNTER — Telehealth: Payer: Self-pay | Admitting: Nurse Practitioner

## 2016-06-02 DIAGNOSIS — C9110 Chronic lymphocytic leukemia of B-cell type not having achieved remission: Secondary | ICD-10-CM

## 2016-06-02 MED ORDER — NITROFURANTOIN MONOHYD MACRO 100 MG PO CAPS
100.0000 mg | ORAL_CAPSULE | Freq: Two times a day (BID) | ORAL | 0 refills | Status: AC
Start: 2016-06-02 — End: 2016-06-09

## 2016-06-02 NOTE — Telephone Encounter (Signed)
I notified [name] the urine culture returned positive for an infection. Prescription for nitrofurantoin sent to her pharmacy. I also let her know Dr. Benay Spice has reviewed the chest x-ray. She has a persistent stable abnormality in the right lung. We will repeat a chest x-ray when she returns in 3 months. She has no significant smoking history.

## 2016-06-12 ENCOUNTER — Other Ambulatory Visit: Payer: Self-pay | Admitting: Internal Medicine

## 2016-06-12 DIAGNOSIS — R0981 Nasal congestion: Secondary | ICD-10-CM

## 2016-06-13 NOTE — Telephone Encounter (Signed)
Generic zyrtec is not on active med list? Ok to rf?

## 2016-06-21 ENCOUNTER — Other Ambulatory Visit (INDEPENDENT_AMBULATORY_CARE_PROVIDER_SITE_OTHER): Payer: Medicare Other

## 2016-06-21 ENCOUNTER — Ambulatory Visit (INDEPENDENT_AMBULATORY_CARE_PROVIDER_SITE_OTHER): Payer: Medicare Other | Admitting: Internal Medicine

## 2016-06-21 ENCOUNTER — Encounter: Payer: Self-pay | Admitting: Internal Medicine

## 2016-06-21 DIAGNOSIS — I10 Essential (primary) hypertension: Secondary | ICD-10-CM | POA: Diagnosis not present

## 2016-06-21 DIAGNOSIS — S42411S Displaced simple supracondylar fracture without intercondylar fracture of right humerus, sequela: Secondary | ICD-10-CM | POA: Diagnosis not present

## 2016-06-21 DIAGNOSIS — E538 Deficiency of other specified B group vitamins: Secondary | ICD-10-CM

## 2016-06-21 DIAGNOSIS — N390 Urinary tract infection, site not specified: Secondary | ICD-10-CM

## 2016-06-21 LAB — URINALYSIS
BILIRUBIN URINE: NEGATIVE
Hgb urine dipstick: NEGATIVE
Leukocytes, UA: NEGATIVE
NITRITE: NEGATIVE
PH: 7 (ref 5.0–8.0)
Specific Gravity, Urine: 1.01 (ref 1.000–1.030)
TOTAL PROTEIN, URINE-UPE24: NEGATIVE
URINE GLUCOSE: NEGATIVE
Urobilinogen, UA: 0.2 (ref 0.0–1.0)

## 2016-06-21 MED ORDER — POLYETHYLENE GLYCOL 3350 17 GM/SCOOP PO POWD: 17.0000 g | Freq: Every day | ORAL | 11 refills | Status: DC

## 2016-06-21 NOTE — Assessment & Plan Note (Signed)
Amlodipine, HCTZ °

## 2016-06-21 NOTE — Progress Notes (Signed)
Subjective:  Patient ID: [name], female    DOB: 05-09-1926  Age: 82 y.o. MRN: [id number]  CC: No chief complaint on file.   HPI [name] presents for R shoulder (humerus neck) fx, HTN, B12 def f/u. C/o constipation. C/o urinary sx's  Outpatient Medications Prior to Visit  Medication Sig Dispense Refill  . acetaminophen (TYLENOL) 500 MG tablet Take 500 mg by mouth 2 (two) times daily.     Marland Kitchen amLODipine (NORVASC) 5 MG tablet TAKE 1 TABLET (5 MG TOTAL) BY MOUTH DAILY. 30 tablet 11  . aspirin 81 MG EC tablet Take 81 mg by mouth daily.      . B-D 3CC LUER-LOK SYR 25GX1" 25G X 1" 3 ML MISC USE TO ADMINISTER B12 INJECTIONS 10 each 2  . B-D INS SYR ULTRAFINE 1CC/30G 30G X 1/2" 1 ML MISC USE TO ADMINISTER B12 INJECTIONS 10 each 1  . cefpodoxime (VANTIN) 200 MG tablet Take 1 tablet (200 mg total) by mouth every 12 (twelve) hours. For 7 more days    . cetirizine (ZYRTEC) 10 MG tablet TAKE 1 TABLET BY MOUTH EVERY DAY 14 tablet 0  . Cholecalciferol (EQL VITAMIN D3) 1000 UNITS tablet Take 1,000 Units by mouth daily.      . cromolyn (OPTICROM) 4 % ophthalmic solution 1 DROP IN BOTH EYES FOUR TIMES A DAY  12  . cyanocobalamin (,VITAMIN B-12,) 1000 MCG/ML injection ADMINISTER 1 CC SUBQ EVERY 4 WEEKS AS DIRECTED 10 mL 1  . fluticasone (FLONASE) 50 MCG/ACT nasal spray Place 2 sprays into both nostrils daily. 16 g 0  . folic acid (FOLVITE) 1 MG tablet Take 1 mg by mouth daily. For restless legs    . gabapentin (NEURONTIN) 300 MG capsule TAKE ONE CAPSULE BY MOUTH EVERY DAY 90 capsule 2  . HYDROcodone-acetaminophen (NORCO/VICODIN) 5-325 MG tablet Take 1-2 tablets by mouth every 6 (six) hours as needed for moderate pain or severe pain. 30 tablet 0  . Insulin Syringe-Needle U-100 (B-D INS SYRINGE 0.5CC/30GX1/2") 30G X 1/2" 0.5 ML MISC by Does not apply route. To administer B12 injections     . KLOR-CON M10 10 MEQ tablet TAKE 1 TABLET (10 MEQ TOTAL) BY MOUTH DAILY. 90 tablet 3  . latanoprost  (XALATAN) 0.005 % ophthalmic solution Place 1 drop into both eyes at bedtime. 2.5 mL 5  . loratadine (CLARITIN) 10 MG tablet TAKE 1 TABLET (10 MG TOTAL) BY MOUTH DAILY. 90 tablet 3  . pantoprazole (PROTONIX) 40 MG tablet Take 1 tablet (40 mg total) by mouth daily. 90 tablet 3  . simvastatin (ZOCOR) 20 MG tablet TAKE 1 TABLET (20 MG TOTAL) BY MOUTH DAILY. (Patient taking differently: TAKE 1 TABLET (10 MG TOTAL) BY MOUTH DAILY.) 90 tablet 2  . conjugated estrogens (PREMARIN) vaginal cream 1/2 gram vaginally every night x 1 week, then twice weekly at bedtime (Patient not taking: Reported on 06/21/2016) 90 g 3   No facility-administered medications prior to visit.     ROS Review of Systems  Constitutional: Positive for fatigue. Negative for activity change, appetite change, chills and unexpected weight change.  HENT: Negative for congestion, mouth sores and sinus pressure.   Eyes: Negative for visual disturbance.  Respiratory: Negative for cough and chest tightness.   Gastrointestinal: Negative for abdominal pain and nausea.  Genitourinary: Negative for difficulty urinating, frequency and vaginal pain.  Musculoskeletal: Positive for arthralgias and gait problem. Negative for back pain.  Skin: Negative for pallor and rash.  Neurological: Negative  for dizziness, tremors, weakness, numbness and headaches.  Psychiatric/Behavioral: Negative for confusion and sleep disturbance.    Objective:  BP 136/70   Pulse 77   Temp 98.5 F (36.9 C) (Oral)   Wt 136 lb (61.7 kg)   SpO2 98%   BMI 23.34 kg/m   BP Readings from Last 3 Encounters:  06/21/16 136/70  05/30/16 128/67  05/30/16 122/67    Wt Readings from Last 3 Encounters:  06/21/16 136 lb (61.7 kg)  05/30/16 141 lb 14.4 oz (64.4 kg)  05/11/16 139 lb 11.2 oz (63.4 kg)    Physical Exam  Constitutional: She appears well-developed. No distress.  HENT:  Head: Normocephalic.  Right Ear: External ear normal.  Left Ear: External ear  normal.  Nose: Nose normal.  Mouth/Throat: Oropharynx is clear and moist.  Eyes: Conjunctivae are normal. Pupils are equal, round, and reactive to light. Right eye exhibits no discharge. Left eye exhibits no discharge.  Neck: Normal range of motion. Neck supple. No JVD present. No tracheal deviation present. No thyromegaly present.  Cardiovascular: Normal rate, regular rhythm and normal heart sounds.   Pulmonary/Chest: No stridor. No respiratory distress. She has no wheezes.  Abdominal: Soft. Bowel sounds are normal. She exhibits no distension and no mass. There is no tenderness. There is no rebound and no guarding.  Musculoskeletal: She exhibits tenderness. She exhibits no edema.  Lymphadenopathy:    She has no cervical adenopathy.  Neurological: She displays normal reflexes. No cranial nerve deficit. She exhibits normal muscle tone. Coordination abnormal.  Skin: No rash noted. No erythema.  Psychiatric: She has a normal mood and affect. Her behavior is normal. Judgment and thought content normal.  Cane R shoulder in a sling  Lab Results  Component Value Date   WBC 6.2 05/11/2016   HGB 11.6 (L) 05/11/2016   HCT 34.0 (L) 05/11/2016   PLT 120 (L) 05/11/2016   GLUCOSE 99 05/11/2016   CHOL 191 10/01/2014   TRIG 72.0 10/01/2014   HDL 57.80 10/01/2014   LDLCALC 119 (H) 10/01/2014   ALT 19 05/09/2016   AST 23 05/09/2016   NA 135 05/11/2016   K 3.9 05/11/2016   CL 104 05/11/2016   CREATININE 0.70 05/11/2016   BUN 17 05/11/2016   CO2 26 05/11/2016   TSH 3.38 12/03/2015   INR 1.05 05/10/2016   HGBA1C  10/07/2008    5.9 (NOTE)   The ADA recommends the following therapeutic goal for glycemic   control related to Hgb A1C measurement:   Goal of Therapy:   < 7.0% Hgb A1C   Reference: American Diabetes Association: Clinical Practice   Recommendations 2008, Diabetes Care,  2008, 31:(Suppl 1).   MICROALBUR 0.4 07/14/2009    Dg Chest 2 View  Result Date: 05/30/2016 CLINICAL DATA:  Cough  and runny nose. History of CLL with nodular density in the right upper lobe seen on prior chest radiograph. EXAM: CHEST  2 VIEW COMPARISON:  05/09/2016 and 02/07/2015 CXR, lung apices from CT cervical spine dated 05/09/2016. FINDINGS: The cardiac silhouette is normal in size. The aorta is atherosclerotic without aneurysm. The aorta appears slightly tortuous. There is mild dextro convex curvature of the dorsal spine at the thoracolumbar junction. Lungs are slightly hyperinflated without pneumonic consolidations. Pulmonary vasculature is unremarkable. Ill-defined pulmonary opacity in the right upper lobe with ill-defined margins is again seen. Baseline CT may help further correlation. Unfortunately this area is excluded of the cervical spine CT 05/09/2016. Subpleural scarring is noted in the left  mid lung. IMPRESSION: Ill-defined opacity persists in the right upper lobe. Baseline CT may help for better characterization given its irregular appearing margins. Electronically Signed   By: Ashley Royalty M.D.   On: 05/30/2016 22:03    Assessment & Plan:   There are no diagnoses linked to this encounter. I am having Ms. Giglia maintain her Insulin Syringe-Needle U-100, aspirin, acetaminophen, Cholecalciferol, B-D INS SYR ULTRAFINE 123XX123, folic acid, B-D 3CC LUER-LOK SYR 25GX1", amLODipine, cyanocobalamin, pantoprazole, conjugated estrogens, simvastatin, latanoprost, KLOR-CON M10, gabapentin, cromolyn, fluticasone, HYDROcodone-acetaminophen, cefpodoxime, loratadine, cetirizine, and nystatin.  Meds ordered this encounter  Medications  . nystatin (MYCOSTATIN) 100000 UNIT/ML suspension    Sig: SWISH AND SPIT WITH 1 TEASPOONFUL 4 TO 5 TIMES DAILY    Refill:  0     Follow-up: No Follow-up on file.  Walker Kehr, MD

## 2016-06-21 NOTE — Assessment & Plan Note (Signed)
On B12 shots 

## 2016-06-21 NOTE — Assessment & Plan Note (Signed)
UA

## 2016-06-21 NOTE — Assessment & Plan Note (Signed)
R shoulder (humerus neck) fx - acute on chronic  Sling PT Tylenol prn

## 2016-06-21 NOTE — Progress Notes (Signed)
Pre visit review using our clinic review tool, if applicable. No additional management support is needed unless otherwise documented below in the visit note. 

## 2016-06-24 ENCOUNTER — Telehealth: Payer: Self-pay

## 2016-06-24 DIAGNOSIS — R0981 Nasal congestion: Secondary | ICD-10-CM

## 2016-06-24 MED ORDER — FLUTICASONE PROPIONATE 50 MCG/ACT NA SUSP: 2.0000 | Freq: Every day | NASAL | 0 refills | Status: DC

## 2016-06-24 NOTE — Telephone Encounter (Signed)
Pt rq rf for fluticasone. erx sent.

## 2016-06-26 ENCOUNTER — Other Ambulatory Visit: Payer: Self-pay | Admitting: Internal Medicine

## 2016-06-26 DIAGNOSIS — R0981 Nasal congestion: Secondary | ICD-10-CM

## 2016-06-27 ENCOUNTER — Ambulatory Visit (HOSPITAL_BASED_OUTPATIENT_CLINIC_OR_DEPARTMENT_OTHER): Payer: Medicare Other

## 2016-06-27 ENCOUNTER — Other Ambulatory Visit (HOSPITAL_BASED_OUTPATIENT_CLINIC_OR_DEPARTMENT_OTHER): Payer: Medicare Other

## 2016-06-27 VITALS — BP 111/60 | HR 72 | Temp 98.8°F | Resp 18

## 2016-06-27 DIAGNOSIS — C9110 Chronic lymphocytic leukemia of B-cell type not having achieved remission: Secondary | ICD-10-CM

## 2016-06-27 LAB — CBC WITH DIFFERENTIAL/PLATELET
BASO%: 0.6 % (ref 0.0–2.0)
Basophils Absolute: 0 10*3/uL (ref 0.0–0.1)
EOS ABS: 0.1 10*3/uL (ref 0.0–0.5)
EOS%: 1.1 % (ref 0.0–7.0)
HCT: 42.2 % (ref 34.8–46.6)
HGB: 13.9 g/dL (ref 11.6–15.9)
LYMPH%: 31.1 % (ref 14.0–49.7)
MCH: 30.8 pg (ref 25.1–34.0)
MCHC: 32.9 g/dL (ref 31.5–36.0)
MCV: 93.4 fL (ref 79.5–101.0)
MONO#: 0.4 10*3/uL (ref 0.1–0.9)
MONO%: 7.1 % (ref 0.0–14.0)
NEUT%: 60.1 % (ref 38.4–76.8)
NEUTROS ABS: 3.2 10*3/uL (ref 1.5–6.5)
PLATELETS: 141 10*3/uL — AB (ref 145–400)
RBC: 4.51 10*6/uL (ref 3.70–5.45)
RDW: 14.3 % (ref 11.2–14.5)
WBC: 5.3 10*3/uL (ref 3.9–10.3)
lymph#: 1.6 10*3/uL (ref 0.9–3.3)

## 2016-06-27 MED ORDER — SODIUM CHLORIDE 0.9 % IV SOLN
Freq: Once | INTRAVENOUS | Status: AC
  Administered 2016-06-27: 11:00:00 via INTRAVENOUS

## 2016-06-27 MED ORDER — IMMUNE GLOBULIN (HUMAN) 10 GM/100ML IV SOLN
30.0000 g | Freq: Once | INTRAVENOUS | Status: AC
  Administered 2016-06-27: 30 g via INTRAVENOUS
  Filled 2016-06-27: qty 100

## 2016-06-27 MED ORDER — DIPHENHYDRAMINE HCL 25 MG PO CAPS
25.0000 mg | ORAL_CAPSULE | Freq: Once | ORAL | Status: AC
  Administered 2016-06-27: 25 mg via ORAL

## 2016-06-27 MED ORDER — DIPHENHYDRAMINE HCL 25 MG PO CAPS
ORAL_CAPSULE | ORAL | Status: AC
  Filled 2016-06-27: qty 1

## 2016-06-27 NOTE — Patient Instructions (Signed)

## 2016-06-29 ENCOUNTER — Other Ambulatory Visit: Payer: Self-pay | Admitting: Internal Medicine

## 2016-06-29 DIAGNOSIS — R0981 Nasal congestion: Secondary | ICD-10-CM

## 2016-07-25 ENCOUNTER — Other Ambulatory Visit (HOSPITAL_BASED_OUTPATIENT_CLINIC_OR_DEPARTMENT_OTHER): Payer: Medicare Other

## 2016-07-25 ENCOUNTER — Ambulatory Visit (HOSPITAL_BASED_OUTPATIENT_CLINIC_OR_DEPARTMENT_OTHER): Payer: Medicare Other

## 2016-07-25 VITALS — BP 130/81 | HR 63 | Temp 98.1°F | Resp 18

## 2016-07-25 DIAGNOSIS — C9110 Chronic lymphocytic leukemia of B-cell type not having achieved remission: Secondary | ICD-10-CM

## 2016-07-25 DIAGNOSIS — D801 Nonfamilial hypogammaglobulinemia: Secondary | ICD-10-CM

## 2016-07-25 LAB — CBC WITH DIFFERENTIAL/PLATELET
BASO%: 0.2 % (ref 0.0–2.0)
Basophils Absolute: 0 10*3/uL (ref 0.0–0.1)
EOS%: 1.1 % (ref 0.0–7.0)
Eosinophils Absolute: 0.1 10*3/uL (ref 0.0–0.5)
HCT: 40.6 % (ref 34.8–46.6)
HGB: 13.8 g/dL (ref 11.6–15.9)
LYMPH%: 34.9 % (ref 14.0–49.7)
MCH: 30.9 pg (ref 25.1–34.0)
MCHC: 34 g/dL (ref 31.5–36.0)
MCV: 91 fL (ref 79.5–101.0)
MONO#: 0.5 10*3/uL (ref 0.1–0.9)
MONO%: 8.6 % (ref 0.0–14.0)
NEUT#: 2.9 10*3/uL (ref 1.5–6.5)
NEUT%: 55.2 % (ref 38.4–76.8)
Platelets: 141 10*3/uL — ABNORMAL LOW (ref 145–400)
RBC: 4.46 10*6/uL (ref 3.70–5.45)
RDW: 14.2 % (ref 11.2–14.5)
WBC: 5.3 10*3/uL (ref 3.9–10.3)
lymph#: 1.8 10*3/uL (ref 0.9–3.3)

## 2016-07-25 MED ORDER — IMMUNE GLOBULIN (HUMAN) 10 GM/100ML IV SOLN
30.0000 g | Freq: Once | INTRAVENOUS | Status: AC
  Administered 2016-07-25: 30 g via INTRAVENOUS
  Filled 2016-07-25: qty 200

## 2016-07-25 MED ORDER — DIPHENHYDRAMINE HCL 25 MG PO CAPS
ORAL_CAPSULE | ORAL | Status: AC
  Filled 2016-07-25: qty 1

## 2016-07-25 MED ORDER — DIPHENHYDRAMINE HCL 25 MG PO CAPS
25.0000 mg | ORAL_CAPSULE | Freq: Once | ORAL | Status: AC
  Administered 2016-07-25: 25 mg via ORAL

## 2016-07-25 NOTE — Patient Instructions (Signed)

## 2016-07-31 ENCOUNTER — Telehealth: Payer: Self-pay | Admitting: *Deleted

## 2016-07-31 NOTE — Telephone Encounter (Signed)
Rec call from [facility] stating pt is being d/c from OT today. She states pt had been taking Omeprazole 40 mgand Pantoprazole 40 mg once daily.  She wants to know which medication pt should be taking.   I advised Magda Paganini our chart only shows Pantoprazole 40 mg 1 po qd. Magda Paganini states she will inform pt of this.

## 2016-08-01 ENCOUNTER — Telehealth: Payer: Self-pay | Admitting: Internal Medicine

## 2016-08-01 NOTE — Telephone Encounter (Signed)
Magda Paganini [facility] [phone]  Reported that pt fell at mailbox last night no injuries  Pt thinks she has uti

## 2016-08-01 NOTE — Telephone Encounter (Signed)
Ok UA Thx 

## 2016-08-03 ENCOUNTER — Other Ambulatory Visit: Payer: Medicare Other

## 2016-08-03 ENCOUNTER — Ambulatory Visit (INDEPENDENT_AMBULATORY_CARE_PROVIDER_SITE_OTHER): Payer: Medicare Other | Admitting: Family

## 2016-08-03 ENCOUNTER — Encounter: Payer: Self-pay | Admitting: Family

## 2016-08-03 VITALS — BP 122/68 | HR 73 | Temp 97.8°F | Resp 14 | Ht 64.0 in | Wt 134.0 lb

## 2016-08-03 DIAGNOSIS — K2100 Gastro-esophageal reflux disease with esophagitis, without bleeding: Secondary | ICD-10-CM

## 2016-08-03 DIAGNOSIS — R300 Dysuria: Secondary | ICD-10-CM | POA: Diagnosis not present

## 2016-08-03 LAB — POCT URINALYSIS DIPSTICK
BILIRUBIN UA: NEGATIVE
GLUCOSE UA: NEGATIVE
KETONES UA: NEGATIVE
NITRITE UA: NEGATIVE
PH UA: 6.5
Protein, UA: NEGATIVE
RBC UA: NEGATIVE
Spec Grav, UA: 1.015
Urobilinogen, UA: NEGATIVE

## 2016-08-03 MED ORDER — RANITIDINE HCL 150 MG PO TABS: 150.0000 mg | ORAL_TABLET | Freq: Two times a day (BID) | ORAL | 0 refills | Status: DC

## 2016-08-03 MED ORDER — CIPROFLOXACIN HCL 500 MG PO TABS: 500.0000 mg | ORAL_TABLET | Freq: Two times a day (BID) | ORAL | 0 refills | Status: DC

## 2016-08-03 MED ORDER — PANTOPRAZOLE SODIUM 40 MG PO TBEC: 40.0000 mg | DELAYED_RELEASE_TABLET | Freq: Two times a day (BID) | ORAL | 3 refills | Status: DC

## 2016-08-03 NOTE — Progress Notes (Signed)
Subjective:    Patient ID: [name], female    DOB: March 14, 1926, 83 y.o.   MRN: [id number]  Chief Complaint  Patient presents with  . Dysuria    urinary urgency and frequency, dysuria, had a fall on monday and think it is associated with a bladder infection, legs are weak    HPI:  [name] is a 83 y.o. female who  has a past medical history of Allergy; Chronic ethmoidal sinusitis; Diverticulosis of colon; Glaucoma; colonic polyps; Hyperlipidemia; Hypertension; Hypogammaglobulinemia ([place]); Leukemia ([place]); Osteoarthritis; Peripheral neuropathy ([place]); Pernicious anemia; UTI (lower urinary tract infection) (2010); and Vitamin B12 deficiency. and presents today for an acute office visit.  This is a new problem. Associated symptoms of urinary frequency, urinary urgency, and dysuria have been going on for approximately 1 week. She was noted to have a fall 3 days ago which she is concerned related to her bladder infection and describes that her legs are feeling weak. Denies fevers or flank pain. There are no modifying factors or attempted treatments make it better or worse.  2.) Gastroesophageal reflux - currently maintained on pantoprazole and was taking both pantoprazole and omeprazole. Insurance inform patient to discontinue one of the medications. She reports taking the pantoprazole as prescribed and denies adverse side effects although notes her symptoms are generally poorly controlled with current medication regimen. Continues to experience burning sensations that her worsen when lying down.   Allergies  Allergen Reactions  . Pneumococcal Vaccines Swelling and Rash    Pneumococcal Vaccine-23 valent  . Clarithromycin Other (See Comments)    REACTION: sore mouth  . Hctz [Hydrochlorothiazide]     Low Na  . Oxycodone-Aspirin Other (See Comments)    REACTION: horrible nightmares  . Propoxyphene N-Acetaminophen Other (See Comments)    REACTION: couldn't wake her up       Outpatient Medications Prior to Visit  Medication Sig Dispense Refill  . acetaminophen (TYLENOL) 500 MG tablet Take 500 mg by mouth 2 (two) times daily.     Marland Kitchen amLODipine (NORVASC) 5 MG tablet TAKE 1 TABLET (5 MG TOTAL) BY MOUTH DAILY. 30 tablet 11  . aspirin 81 MG EC tablet Take 81 mg by mouth daily.      . B-D 3CC LUER-LOK SYR 25GX1" 25G X 1" 3 ML MISC USE TO ADMINISTER B12 INJECTIONS 10 each 2  . B-D INS SYR ULTRAFINE 1CC/30G 30G X 1/2" 1 ML MISC USE TO ADMINISTER B12 INJECTIONS 10 each 1  . conjugated estrogens (PREMARIN) vaginal cream 1/2 gram vaginally every night x 1 week, then twice weekly at bedtime 90 g 3  . cromolyn (OPTICROM) 4 % ophthalmic solution 1 DROP IN BOTH EYES FOUR TIMES A DAY  12  . cyanocobalamin (,VITAMIN B-12,) 1000 MCG/ML injection ADMINISTER 1 CC SUBQ EVERY 4 WEEKS AS DIRECTED 10 mL 1  . fluticasone (FLONASE) 50 MCG/ACT nasal spray Place 2 sprays into both nostrils daily. 16 g 0  . folic acid (FOLVITE) 1 MG tablet Take 1 mg by mouth daily. For restless legs    . gabapentin (NEURONTIN) 300 MG capsule TAKE ONE CAPSULE BY MOUTH EVERY DAY 90 capsule 2  . HYDROcodone-acetaminophen (NORCO/VICODIN) 5-325 MG tablet Take 1-2 tablets by mouth every 6 (six) hours as needed for moderate pain or severe pain. 30 tablet 0  . Insulin Syringe-Needle U-100 (B-D INS SYRINGE 0.5CC/30GX1/2") 30G X 1/2" 0.5 ML MISC by Does not apply route. To administer B12 injections     . KLOR-CON  M10 10 MEQ tablet TAKE 1 TABLET (10 MEQ TOTAL) BY MOUTH DAILY. 90 tablet 3  . latanoprost (XALATAN) 0.005 % ophthalmic solution Place 1 drop into both eyes at bedtime. 2.5 mL 5  . loratadine (CLARITIN) 10 MG tablet TAKE 1 TABLET (10 MG TOTAL) BY MOUTH DAILY. 90 tablet 3  . nystatin (MYCOSTATIN) 100000 UNIT/ML suspension SWISH AND SPIT WITH 1 TEASPOONFUL 4 TO 5 TIMES DAILY  0  . simvastatin (ZOCOR) 20 MG tablet TAKE 1 TABLET (20 MG TOTAL) BY MOUTH DAILY. (Patient taking differently: TAKE 1 TABLET (10 MG  TOTAL) BY MOUTH DAILY.) 90 tablet 2  . pantoprazole (PROTONIX) 40 MG tablet Take 1 tablet (40 mg total) by mouth daily. 90 tablet 3  . cefpodoxime (VANTIN) 200 MG tablet Take 1 tablet (200 mg total) by mouth every 12 (twelve) hours. For 7 more days    . Cholecalciferol (EQL VITAMIN D3) 1000 UNITS tablet Take 1,000 Units by mouth daily.      . [employer]S INDOOR/OUTDOOR ALLERGY RLF 10 MG tablet TAKE 1 TABLET BY MOUTH EVERY DAY 28 tablet 2  . [employer] INDOOR/OUTDOOR ALLERGY RLF 10 MG tablet TAKE 1 TABLET BY MOUTH EVERY DAY 14 tablet 0  . polyethylene glycol powder (GLYCOLAX/MIRALAX) powder Take 17 g by mouth daily. 500 g 11   No facility-administered medications prior to visit.       Past Surgical History:  Procedure Laterality Date  . ABDOMINAL HYSTERECTOMY  1987  . bilateral cataracts  2007  . bladder tack  1987  . L4-L5 Laminetomy  1999  . TUBAL LIGATION    . tubaligation  1961  . ureter reconstruction  1992      Past Medical History:  Diagnosis Date  . Allergy    rhinitis  . Chronic ethmoidal sinusitis   . Diverticulosis of colon   . Glaucoma   . Hx of colonic polyps   . Hyperlipidemia   . Hypertension   . Hypogammaglobulinemia ([place])    Monthly IVIG  . Leukemia ([place])    Chronic lymphocytic leukemia  . Osteoarthritis   . Peripheral neuropathy ([place])   . Pernicious anemia   . UTI (lower urinary tract infection) 2010  . Vitamin B12 deficiency       Review of Systems  Constitutional: Negative for chills and fever.  Respiratory: Negative for chest tightness and shortness of breath.   Genitourinary: Positive for dysuria, frequency and urgency. Negative for flank pain and hematuria.      Objective:    BP 122/68 (BP Location: Left Arm, Patient Position: Sitting, Cuff Size: Normal)   Pulse 73   Temp 97.8 F (36.6 C) (Oral)   Resp 14   Ht 5\' 4"  (1.626 m)   Wt 134 lb (60.8 kg)   SpO2 97%   BMI 23.00 kg/m  Nursing note and vital signs reviewed.  Physical Exam   Constitutional: She is oriented to person, place, and time. She appears well-developed and well-nourished. No distress.  Cardiovascular: Normal rate, regular rhythm, normal heart sounds and intact distal pulses.   Pulmonary/Chest: Effort normal and breath sounds normal.  Abdominal: There is no CVA tenderness.  Neurological: She is alert and oriented to person, place, and time.  Skin: Skin is warm and dry.  Psychiatric: She has a normal mood and affect. Her behavior is normal. Judgment and thought content normal.       Assessment & Plan:   Problem List Items Addressed This Visit      Digestive  GASTROESOPHAGEAL REFLUX DISEASE    Symptoms of gastroesophageal reflux remain labile although improved with 2 different proton pump inhibitors. Discontinue omeprazole. Continue current dosage of pantoprazole. Start Zantac. Information on GERD related diets provided. Follow-up if symptoms worsen or do not improve.      Relevant Medications   ranitidine (ZANTAC) 150 MG tablet   pantoprazole (PROTONIX) 40 MG tablet     Other   Dysuria - Primary    This is a new problem. In office urinalysis positive for leukocytes and negative for hematuria and nitrites. Symptoms are consistent with urinary tract infection. Start ciprofloxacin. Urine sent for culture for confirmation. Follow-up if symptoms worsen or do not improve.      Relevant Medications   ciprofloxacin (CIPRO) 500 MG tablet   Other Relevant Orders   POCT urinalysis dipstick (Completed)   Urine culture       I have discontinued Ms. Roblero's Cholecalciferol, cefpodoxime, polyethylene glycol powder, CVS INDOOR/OUTDOOR ALLERGY RLF, and CVS INDOOR/OUTDOOR ALLERGY RLF. I have also changed her pantoprazole. Additionally, I am having her start on ciprofloxacin and ranitidine. Lastly, I am having her maintain her Insulin Syringe-Needle U-100, aspirin, acetaminophen, B-D INS SYR ULTRAFINE 123XX123, folic acid, B-D 3CC LUER-LOK SYR 25GX1",  amLODipine, cyanocobalamin, conjugated estrogens, simvastatin, latanoprost, KLOR-CON M10, gabapentin, cromolyn, HYDROcodone-acetaminophen, loratadine, nystatin, and fluticasone.   Meds ordered this encounter  Medications  . ciprofloxacin (CIPRO) 500 MG tablet    Sig: Take 1 tablet (500 mg total) by mouth 2 (two) times daily.    Dispense:  10 tablet    Refill:  0    Order Specific Question:   Supervising Provider    Answer:   Pricilla Holm A [id number]  . ranitidine (ZANTAC) 150 MG tablet    Sig: Take 1 tablet (150 mg total) by mouth 2 (two) times daily.    Dispense:  60 tablet    Refill:  0    Order Specific Question:   Supervising Provider    Answer:   Pricilla Holm A [id number]  . pantoprazole (PROTONIX) 40 MG tablet    Sig: Take 1 tablet (40 mg total) by mouth 2 (two) times daily.    Dispense:  90 tablet    Refill:  3    Order Specific Question:   Supervising Provider    Answer:   Pricilla Holm A [id number]     Follow-up: Return if symptoms worsen or fail to improve.  Mauricio Po, FNP

## 2016-08-03 NOTE — Assessment & Plan Note (Signed)
Symptoms of gastroesophageal reflux remain labile although improved with 2 different proton pump inhibitors. Discontinue omeprazole. Continue current dosage of pantoprazole. Start Zantac. Information on GERD related diets provided. Follow-up if symptoms worsen or do not improve.

## 2016-08-03 NOTE — Patient Instructions (Signed)
Thank you for choosing [facility].  SUMMARY AND INSTRUCTIONS:  Medication:  Continue to take the pantopraole twice daily and start the Zantac.  Start ciprofloxacin for antibiotic.   Your prescription(s) have been submitted to your pharmacy or been printed and provided for you. Please take as directed and contact our office if you believe you are having problem(s) with the medication(s) or have any questions.  Follow up:  If your symptoms worsen or fail to improve, please contact our office for further instruction, or in case of emergency go directly to the emergency room at the closest medical facility.    Urinary Tract Infection, Adult Introduction A urinary tract infection (UTI) is an infection of any part of the urinary tract. The urinary tract includes the:  Kidneys.  Ureters.  Bladder.  Urethra. These organs make, store, and get rid of pee (urine) in the body. Follow these instructions at home:  Take over-the-counter and prescription medicines only as told by your doctor.  If you were prescribed an antibiotic medicine, take it as told by your doctor. Do not stop taking the antibiotic even if you start to feel better.  Avoid the following drinks:  Alcohol.  Caffeine.  Tea.  Carbonated drinks.  Drink enough fluid to keep your pee clear or pale yellow.  Keep all follow-up visits as told by your doctor. This is important.  Make sure to:  Empty your bladder often and completely. Do not to hold pee for long periods of time.  Empty your bladder before and after sex.  Wipe from front to back after a bowel movement if you are female. Use each tissue one time when you wipe. Contact a doctor if:  You have back pain.  You have a fever.  You feel sick to your stomach (nauseous).  You throw up (vomit).  Your symptoms do not get better after 3 days.  Your symptoms go away and then come back. Get help right away if:  You have very bad back  pain.  You have very bad lower belly (abdominal) pain.  You are throwing up and cannot keep down any medicines or water. This information is not intended to replace advice given to you by your health care provider. Make sure you discuss any questions you have with your health care provider. Document Released: 12/27/2007 Document Revised: 12/16/2015 Document Reviewed: 05/31/2015  2017 Elsevier   Food Choices for Gastroesophageal Reflux Disease, Adult When you have gastroesophageal reflux disease (GERD), the foods you eat and your eating habits are very important. Choosing the right foods can help ease your discomfort. What guidelines do I need to follow?  Choose fruits, vegetables, whole grains, and low-fat dairy products.  Choose low-fat meat, fish, and poultry.  Limit fats such as oils, salad dressings, butter, nuts, and avocado.  Keep a food diary. This helps you identify foods that cause symptoms.  Avoid foods that cause symptoms. These may be different for everyone.  Eat small meals often instead of 3 large meals a day.  Eat your meals slowly, in a place where you are relaxed.  Limit fried foods.  Cook foods using methods other than frying.  Avoid drinking alcohol.  Avoid drinking large amounts of liquids with your meals.  Avoid bending over or lying down until 2-3 hours after eating. What foods are not recommended? These are some foods and drinks that may make your symptoms worse: Vegetables  Tomatoes. Tomato juice. Tomato and spaghetti sauce. Chili peppers. Onion and garlic. Horseradish.  Fruits  Oranges, grapefruit, and lemon (fruit and juice). Meats  High-fat meats, fish, and poultry. This includes hot dogs, ribs, ham, sausage, salami, and bacon. Dairy  Whole milk and chocolate milk. Sour cream. Cream. Butter. Ice cream. Cream cheese. Drinks  Coffee and tea. Bubbly (carbonated) drinks or energy drinks. Condiments  Hot sauce. Barbecue sauce. Sweets/Desserts   Chocolate and cocoa. Donuts. Peppermint and spearmint. Fats and Oils  High-fat foods. This includes [place] fries and potato chips. Other  Vinegar. Strong spices. This includes black pepper, white pepper, red pepper, cayenne, curry powder, cloves, ginger, and chili powder. The items listed above may not be a complete list of foods and drinks to avoid. Contact your dietitian for more information.  This information is not intended to replace advice given to you by your health care provider. Make sure you discuss any questions you have with your health care provider. Document Released: 01/09/2012 Document Revised: 12/16/2015 Document Reviewed: 05/14/2013 Elsevier Interactive Patient Education  2017 [employer].

## 2016-08-03 NOTE — Assessment & Plan Note (Signed)
This is a new problem. In office urinalysis positive for leukocytes and negative for hematuria and nitrites. Symptoms are consistent with urinary tract infection. Start ciprofloxacin. Urine sent for culture for confirmation. Follow-up if symptoms worsen or do not improve.

## 2016-08-04 ENCOUNTER — Telehealth: Payer: Self-pay | Admitting: Internal Medicine

## 2016-08-04 LAB — URINE CULTURE: ORGANISM ID, BACTERIA: NO GROWTH

## 2016-08-04 NOTE — Telephone Encounter (Signed)
Pt needs a refill on her b12 injections..  I deactivate her my chart pt would like a call with any results

## 2016-08-05 ENCOUNTER — Other Ambulatory Visit: Payer: Self-pay | Admitting: Internal Medicine

## 2016-08-07 NOTE — Telephone Encounter (Signed)
Rf sent. See meds.  

## 2016-08-08 ENCOUNTER — Telehealth: Payer: Self-pay | Admitting: Family

## 2016-08-08 MED ORDER — NITROFURANTOIN MONOHYD MACRO 100 MG PO CAPS: 100.0000 mg | ORAL_CAPSULE | Freq: Two times a day (BID) | ORAL | 0 refills | Status: DC

## 2016-08-08 NOTE — Telephone Encounter (Signed)
New antibiotic sent to pharmacy. If she continues to feel weak and dizzy, recommend follow up or Urgent Care.

## 2016-08-08 NOTE — Telephone Encounter (Signed)
Patient states that UTI symptoms have not gotten any better.  States she is weak and shaky as well.  Would like to know what to do next.

## 2016-08-08 NOTE — Telephone Encounter (Signed)
Please advise 

## 2016-08-08 NOTE — Telephone Encounter (Signed)
Patient is requesting another round of medication . Please contact patient before 5pm.

## 2016-08-08 NOTE — Telephone Encounter (Signed)
Pt aware.

## 2016-08-11 ENCOUNTER — Telehealth: Payer: Self-pay | Admitting: *Deleted

## 2016-08-11 NOTE — Telephone Encounter (Signed)
Schedule office with Jaionna Weisse or lisa 1/30

## 2016-08-11 NOTE — Telephone Encounter (Signed)
Called pt with appt time change to 0845 for lab/ 0915 with Lattie Haw. She voiced understanding. Per pt PCP has been managing bladder infections. She reports fever has improved with antibiotic. She is more concerned about leg weakness. Informed her provider will evaluate on 1/30. Call office before then if symptoms worsen. She voiced understanding.

## 2016-08-11 NOTE — Telephone Encounter (Signed)
"  I need to make Dr. Benay Spice aware that I've had three bladder infections since April 23, 2016.  I've experienced pretty bad falls with each.  I'm don't know if he needs to increase the dose IVIG or if I need to receive this before 08-22-2016.  I'm weak.  I'm at home and want to be able to remain in my home.  My son from [place] is with me now but he has a family.  I eat and drink well.  I sleep well at might but I have leukemia.  Return number [phone]."

## 2016-08-16 ENCOUNTER — Ambulatory Visit (INDEPENDENT_AMBULATORY_CARE_PROVIDER_SITE_OTHER): Payer: Medicare Other | Admitting: Internal Medicine

## 2016-08-16 ENCOUNTER — Encounter: Payer: Self-pay | Admitting: Internal Medicine

## 2016-08-16 ENCOUNTER — Other Ambulatory Visit (INDEPENDENT_AMBULATORY_CARE_PROVIDER_SITE_OTHER): Payer: Medicare Other

## 2016-08-16 VITALS — BP 138/80 | HR 82 | Temp 98.2°F | Resp 20 | Wt 136.0 lb

## 2016-08-16 DIAGNOSIS — R350 Frequency of micturition: Secondary | ICD-10-CM | POA: Insufficient documentation

## 2016-08-16 DIAGNOSIS — R109 Unspecified abdominal pain: Secondary | ICD-10-CM | POA: Insufficient documentation

## 2016-08-16 DIAGNOSIS — R609 Edema, unspecified: Secondary | ICD-10-CM

## 2016-08-16 DIAGNOSIS — R1030 Lower abdominal pain, unspecified: Secondary | ICD-10-CM | POA: Diagnosis not present

## 2016-08-16 DIAGNOSIS — R269 Unspecified abnormalities of gait and mobility: Secondary | ICD-10-CM

## 2016-08-16 DIAGNOSIS — R531 Weakness: Secondary | ICD-10-CM

## 2016-08-16 LAB — URINALYSIS, ROUTINE W REFLEX MICROSCOPIC
BILIRUBIN URINE: NEGATIVE
HGB URINE DIPSTICK: NEGATIVE
KETONES UR: NEGATIVE
LEUKOCYTES UA: NEGATIVE
NITRITE: NEGATIVE
RBC / HPF: NONE SEEN (ref 0–?)
Specific Gravity, Urine: 1.01 (ref 1.000–1.030)
TOTAL PROTEIN, URINE-UPE24: NEGATIVE
URINE GLUCOSE: NEGATIVE
Urobilinogen, UA: 0.2 (ref 0.0–1.0)
pH: 7 (ref 5.0–8.0)

## 2016-08-16 LAB — BASIC METABOLIC PANEL
BUN: 15 mg/dL (ref 6–23)
CALCIUM: 9.7 mg/dL (ref 8.4–10.5)
CO2: 33 meq/L — AB (ref 19–32)
Chloride: 96 mEq/L (ref 96–112)
Creatinine, Ser: 0.85 mg/dL (ref 0.40–1.20)
GFR: 67.08 mL/min (ref >60.00)
GLUCOSE: 106 mg/dL — AB (ref 70–99)
Potassium: 3.8 mEq/L (ref 3.5–5.1)
SODIUM: 134 meq/L — AB (ref 135–145)

## 2016-08-16 LAB — CBC WITH DIFFERENTIAL/PLATELET
Basophils Absolute: 0 10*3/uL (ref 0.0–0.1)
Basophils Relative: 0.5 % (ref 0.0–3.0)
Eosinophils Absolute: 0 10*3/uL (ref 0.0–0.7)
Eosinophils Relative: 0.4 % (ref 0.0–5.0)
HCT: 41 % (ref 36.0–46.0)
Hemoglobin: 14.2 g/dL (ref 12.0–15.0)
LYMPHS ABS: 1.9 10*3/uL (ref 0.7–4.0)
Lymphocytes Relative: 33.5 % (ref 12.0–46.0)
MCHC: 34.5 g/dL (ref 30.0–36.0)
MCV: 91 fl (ref 78.0–100.0)
MONO ABS: 0.3 10*3/uL (ref 0.1–1.0)
MONOS PCT: 5 % (ref 3.0–12.0)
NEUTROS PCT: 60.6 % (ref 43.0–77.0)
Neutro Abs: 3.4 10*3/uL (ref 1.4–7.7)
Platelets: 161 10*3/uL (ref 150.0–400.0)
RBC: 4.51 Mil/uL (ref 3.87–5.11)
RDW: 14.5 % (ref 11.5–15.5)
WBC: 5.6 10*3/uL (ref 4.0–10.5)

## 2016-08-16 LAB — VITAMIN B12: Vitamin B-12: 447 pg/mL (ref 211–911)

## 2016-08-16 LAB — HEPATIC FUNCTION PANEL
ALBUMIN: 4.2 g/dL (ref 3.5–5.2)
ALK PHOS: 64 U/L (ref 39–117)
ALT: 14 U/L (ref 0–35)
AST: 21 U/L (ref 0–37)
Bilirubin, Direct: 0.1 mg/dL (ref 0.0–0.3)
TOTAL PROTEIN: 7.8 g/dL (ref 6.0–8.3)
Total Bilirubin: 0.5 mg/dL (ref 0.2–1.2)

## 2016-08-16 LAB — IBC PANEL
Iron: 97 ug/dL (ref 42–145)
SATURATION RATIOS: 27.1 % (ref 20.0–50.0)
Transferrin: 256 mg/dL (ref 212.0–360.0)

## 2016-08-16 LAB — VITAMIN D 25 HYDROXY (VIT D DEFICIENCY, FRACTURES): VITD: 44.17 ng/mL (ref 30.00–100.00)

## 2016-08-16 MED ORDER — HYDROCHLOROTHIAZIDE 12.5 MG PO CAPS: 12.5000 mg | ORAL_CAPSULE | Freq: Every day | ORAL | 0 refills | Status: DC

## 2016-08-16 NOTE — Assessment & Plan Note (Signed)
Etiology unclear, Exam otherwise benign, to check labs as documented, follow with expectant management  

## 2016-08-16 NOTE — Progress Notes (Signed)
Pre visit review using our clinic review tool, if applicable. No additional management support is needed unless otherwise documented below in the visit note. 

## 2016-08-16 NOTE — Progress Notes (Signed)
Subjective:    Patient ID: [name], female    DOB: June 16, 1926, 83 y.o.   MRN: [id number]  HPI    Here to f/u c/o weakness and ? UTI .  C/o low abd pain, constant, not sure if worse with urination, and no worsening n/v or back pain other chronic. Denies urinary symptoms such as dysuria, frequency, urgency, flank pain, hematuria or n/v, fever, chills.     Has hx of falls and weakness late 2017 with shoulder injury related to UTI.  Normally walks with cane, but this wk using the walker.  Last fall about 2 wks ago with using the cane to walk to the mailbox, had abrasion to left no but no other injury that time.  Has hx of CLL, with some immune deficiency followed by Dr Benay Spice.    Also leg swelling for 2 wks, worse later in the day, shoes feel tight, goes away resolves at night. Drinking plenty of fluids.    Lives alone except son staying with her for now, but has to return home soon. Past Medical History:  Diagnosis Date  . Allergy    rhinitis  . Chronic ethmoidal sinusitis   . Diverticulosis of colon   . Glaucoma   . Hx of colonic polyps   . Hyperlipidemia   . Hypertension   . Hypogammaglobulinemia ([place])    Monthly IVIG  . Leukemia ([place])    Chronic lymphocytic leukemia  . Osteoarthritis   . Peripheral neuropathy ([place])   . Pernicious anemia   . UTI (lower urinary tract infection) 2010  . Vitamin B12 deficiency    Past Surgical History:  Procedure Laterality Date  . ABDOMINAL HYSTERECTOMY  1987  . bilateral cataracts  2007  . bladder tack  1987  . L4-L5 Laminetomy  1999  . TUBAL LIGATION    . tubaligation  1961  . ureter reconstruction  1992    reports that she has never smoked. She has never used smokeless tobacco. She reports that she does not drink alcohol or use drugs. family history includes Cancer in her mother, other, sister, and sister; Diabetes in her mother; Hypertension in her other; Jaundice in her father; Other in her sister; Stroke (age of onset: 67) in  her son; Thyroid disease in her sister. Allergies  Allergen Reactions  . Pneumococcal Vaccines Swelling and Rash    Pneumococcal Vaccine-23 valent  . Clarithromycin Other (See Comments)    REACTION: sore mouth  . Hctz [Hydrochlorothiazide]     Low Na  . Oxycodone-Aspirin Other (See Comments)    REACTION: horrible nightmares  . Propoxyphene N-Acetaminophen Other (See Comments)    REACTION: couldn't wake her up   Current Outpatient Prescriptions on File Prior to Visit  Medication Sig Dispense Refill  . acetaminophen (TYLENOL) 500 MG tablet Take 500 mg by mouth 2 (two) times daily.     Marland Kitchen amLODipine (NORVASC) 5 MG tablet TAKE 1 TABLET (5 MG TOTAL) BY MOUTH DAILY. 30 tablet 11  . aspirin 81 MG EC tablet Take 81 mg by mouth daily.      . B-D 3CC LUER-LOK SYR 25GX1" 25G X 1" 3 ML MISC USE TO ADMINISTER B12 INJECTIONS 10 each 2  . B-D INS SYR ULTRAFINE 1CC/30G 30G X 1/2" 1 ML MISC USE TO ADMINISTER B12 INJECTIONS 10 each 1  . conjugated estrogens (PREMARIN) vaginal cream 1/2 gram vaginally every night x 1 week, then twice weekly at bedtime 90 g 3  . cromolyn (OPTICROM) 4 %  ophthalmic solution 1 DROP IN BOTH EYES FOUR TIMES A DAY  12  . cyanocobalamin (,VITAMIN B-12,) 1000 MCG/ML injection ADMINISTER 1 CC SUBQ EVERY 4 WEEKS AS DIRECTED 10 mL 0  . fluticasone (FLONASE) 50 MCG/ACT nasal spray Place 2 sprays into both nostrils daily. 16 g 0  . folic acid (FOLVITE) 1 MG tablet Take 1 mg by mouth daily. For restless legs    . gabapentin (NEURONTIN) 300 MG capsule TAKE ONE CAPSULE BY MOUTH EVERY DAY 90 capsule 2  . HYDROcodone-acetaminophen (NORCO/VICODIN) 5-325 MG tablet Take 1-2 tablets by mouth every 6 (six) hours as needed for moderate pain or severe pain. 30 tablet 0  . Insulin Syringe-Needle U-100 (B-D INS SYRINGE 0.5CC/30GX1/2") 30G X 1/2" 0.5 ML MISC by Does not apply route. To administer B12 injections     . KLOR-CON M10 10 MEQ tablet TAKE 1 TABLET (10 MEQ TOTAL) BY MOUTH DAILY. 90 tablet 3    . latanoprost (XALATAN) 0.005 % ophthalmic solution Place 1 drop into both eyes at bedtime. 2.5 mL 5  . loratadine (CLARITIN) 10 MG tablet TAKE 1 TABLET (10 MG TOTAL) BY MOUTH DAILY. 90 tablet 3  . nitrofurantoin, macrocrystal-monohydrate, (MACROBID) 100 MG capsule Take 1 capsule (100 mg total) by mouth 2 (two) times daily. 14 capsule 0  . nystatin (MYCOSTATIN) 100000 UNIT/ML suspension SWISH AND SPIT WITH 1 TEASPOONFUL 4 TO 5 TIMES DAILY  0  . pantoprazole (PROTONIX) 40 MG tablet Take 1 tablet (40 mg total) by mouth 2 (two) times daily. 90 tablet 3  . ranitidine (ZANTAC) 150 MG tablet Take 1 tablet (150 mg total) by mouth 2 (two) times daily. 60 tablet 0  . simvastatin (ZOCOR) 20 MG tablet TAKE 1 TABLET (20 MG TOTAL) BY MOUTH DAILY. (Patient taking differently: TAKE 1 TABLET (10 MG TOTAL) BY MOUTH DAILY.) 90 tablet 2  . ciprofloxacin (CIPRO) 500 MG tablet Take 1 tablet (500 mg total) by mouth 2 (two) times daily. (Patient not taking: Reported on 08/16/2016) 10 tablet 0   No current facility-administered medications on file prior to visit.     Review of Systems  Constitutional: Negative for unusual diaphoresis or night sweats HENT: Negative for ear swelling or discharge Eyes: Negative for worsening visual haziness  Respiratory: Negative for choking and stridor.   Gastrointestinal: Negative for distension or worsening eructation Genitourinary: Negative for retention or change in urine volume.  Musculoskeletal: Negative for other MSK pain or swelling Skin: Negative for color change and worsening wound Neurological: Negative for tremors and numbness other than noted  Psychiatric/Behavioral: Negative for decreased concentration or agitation other than above   All other system neg per pt    Objective:   Physical Exam BP 138/80   Pulse 82   Temp 98.2 F (36.8 C) (Oral)   Resp 20   Wt 136 lb (61.7 kg)   SpO2 96%   BMI 23.34 kg/m  VS noted,  Constitutional: Pt appears in no apparent  distress HENT: Head: NCAT.  Right Ear: External ear normal.  Left Ear: External ear normal.  Eyes: . Pupils are equal, round, and reactive to light. Conjunctivae and EOM are normal Neck: Normal range of motion. Neck supple.  Cardiovascular: Normal rate and regular rhythm.   Pulmonary/Chest: Effort normal and breath sounds without rales or wheezing.  Abd:  Soft, ND, + BS with bilat lower mid abd tender, no guarding or rebound Neurological: Pt is alert. Not confused , motor grossly intact Skin: Skin is warm. No rash,  no LE edema Psychiatric: Pt behavior is normal. No agitation.  No other new exam findings  Transthoracic Echocardiography Study Conclusions - Left ventricle: The cavity size was normal. There was mild   concentric hypertrophy. Systolic function was normal. The   estimated ejection fraction was in the range of 60% to 65%. Wall   motion was normal; there were no regional wall motion   abnormalities. - Aortic valve: There was mild regurgitation. - Mitral valve: Mildly calcified annulus. - Pulmonary arteries: Systolic pressure was mildly increased. PA   peak pressure: 36 mm Hg (S). - Pericardium, extracardiac: A small pericardial effusion was   identified.  Lab Results  Component Value Date   WBC 5.3 07/25/2016   HGB 13.8 07/25/2016   HCT 40.6 07/25/2016   PLT 141 (L) 07/25/2016   GLUCOSE 99 05/11/2016   CHOL 191 10/01/2014   TRIG 72.0 10/01/2014   HDL 57.80 10/01/2014   LDLCALC 119 (H) 10/01/2014   ALT 19 05/09/2016   AST 23 05/09/2016   NA 135 05/11/2016   K 3.9 05/11/2016   CL 104 05/11/2016   CREATININE 0.70 05/11/2016   BUN 17 05/11/2016   CO2 26 05/11/2016   TSH 3.38 12/03/2015   INR 1.05 05/10/2016   HGBA1C  10/07/2008    5.9 (NOTE)   The ADA recommends the following therapeutic goal for glycemic   control related to Hgb A1C measurement:   Goal of Therapy:   < 7.0% Hgb A1C   Reference: American Diabetes Association: Clinical Practice   Recommendations  2008, Diabetes Care,  2008, 31:(Suppl 1).   MICROALBUR 0.4 07/14/2009       Assessment & Plan:

## 2016-08-16 NOTE — Assessment & Plan Note (Addendum)
Etiology unclear, diff includes UTI, constipation, diverticulitis or other; I doubt infectious illness but given severity of pain for 1 wk will need CT abd/pelvis with CM (with labs today prior)  Note:  Total time for pt hx, exam, review of record with pt in the room, determination of diagnoses and plan for further eval and tx is > 40 min, with over 50% spent in coordination and counseling of patient

## 2016-08-16 NOTE — Assessment & Plan Note (Signed)
I suspect related to increased po fluids recently to avoid dehydration and further falls; ok for HCT 12.5 qd for 1 wk, then suspect can stop with no further forced fluids

## 2016-08-16 NOTE — Assessment & Plan Note (Signed)
I suspect this time related to increased po fluid intake, for urine studies but doubt UTI, will hold antibx pending cx results

## 2016-08-16 NOTE — Assessment & Plan Note (Signed)
For [facility] PT, RN and aide

## 2016-08-16 NOTE — Patient Instructions (Addendum)
Please take all new medication as prescribed - the fluid pill  - 1 per day for 1 week  Please continue all other medications as before, and refills have been done if requested.  Please have the pharmacy call with any other refills you may need.  Please keep your appointments with your specialists as you may have planned  You will be contacted regarding the referral for: [facility] with RN, PT and aide  You will be contacted regarding the referral for: [facility] if you qualify for their services  You will be contacted regarding the referral for: CT scan Abd/pelvis (to see PCC's now)  Please go to the LAB in the Basement (turn left off the elevator) for the tests to be done today  You will be contacted by phone if any changes need to be made immediately.  Otherwise, you will receive a letter about your results with an explanation, but please check with MyChart first.  Please remember to sign up for MyChart if you have not done so, as this will be important to you in the future with finding out test results, communicating by private email, and scheduling acute appointments online when needed.  If you have Medicare related insurance (such as traditoinal Medicare, [employer] or [employer], or similar), Please make an appointment at the Scheduling desk with Maudie Mercury, the [employer], for your Wellness Visit in this office, which is a benefit with your insurance.  Please return in 1 wk with Dr Alain Marion, or sooner if needed

## 2016-08-17 LAB — URINE CULTURE: ORGANISM ID, BACTERIA: NO GROWTH

## 2016-08-22 ENCOUNTER — Ambulatory Visit (HOSPITAL_BASED_OUTPATIENT_CLINIC_OR_DEPARTMENT_OTHER): Payer: Medicare Other | Admitting: Oncology

## 2016-08-22 ENCOUNTER — Other Ambulatory Visit: Payer: Medicare Other

## 2016-08-22 ENCOUNTER — Ambulatory Visit (HOSPITAL_BASED_OUTPATIENT_CLINIC_OR_DEPARTMENT_OTHER): Payer: Medicare Other

## 2016-08-22 ENCOUNTER — Other Ambulatory Visit (HOSPITAL_BASED_OUTPATIENT_CLINIC_OR_DEPARTMENT_OTHER): Payer: Medicare Other

## 2016-08-22 VITALS — BP 124/71 | HR 68 | Temp 98.8°F | Resp 17

## 2016-08-22 VITALS — BP 121/78 | HR 89 | Temp 98.1°F | Resp 17 | Ht 64.0 in | Wt 133.6 lb

## 2016-08-22 DIAGNOSIS — D801 Nonfamilial hypogammaglobulinemia: Secondary | ICD-10-CM

## 2016-08-22 DIAGNOSIS — C9110 Chronic lymphocytic leukemia of B-cell type not having achieved remission: Secondary | ICD-10-CM

## 2016-08-22 LAB — CBC WITH DIFFERENTIAL/PLATELET
BASO%: 0.4 % (ref 0.0–2.0)
Basophils Absolute: 0 10e3/uL (ref 0.0–0.1)
EOS%: 1 % (ref 0.0–7.0)
Eosinophils Absolute: 0.1 10e3/uL (ref 0.0–0.5)
HCT: 43.4 % (ref 34.8–46.6)
HGB: 14.6 g/dL (ref 11.6–15.9)
LYMPH%: 32.4 % (ref 14.0–49.7)
MCH: 31.2 pg (ref 25.1–34.0)
MCHC: 33.7 g/dL (ref 31.5–36.0)
MCV: 92.7 fL (ref 79.5–101.0)
MONO#: 0.5 10e3/uL (ref 0.1–0.9)
MONO%: 7.4 % (ref 0.0–14.0)
NEUT#: 3.9 10e3/uL (ref 1.5–6.5)
NEUT%: 58.8 % (ref 38.4–76.8)
Platelets: 145 10e3/uL (ref 145–400)
RBC: 4.68 10e6/uL (ref 3.70–5.45)
RDW: 14.2 % (ref 11.2–14.5)
WBC: 6.7 10e3/uL (ref 3.9–10.3)
lymph#: 2.2 10e3/uL (ref 0.9–3.3)

## 2016-08-22 MED ORDER — DIPHENHYDRAMINE HCL 25 MG PO CAPS
25.0000 mg | ORAL_CAPSULE | Freq: Once | ORAL | Status: AC
  Administered 2016-08-22: 25 mg via ORAL

## 2016-08-22 MED ORDER — IMMUNE GLOBULIN (HUMAN) 10 GM/100ML IV SOLN
30.0000 g | Freq: Once | INTRAVENOUS | Status: AC
  Administered 2016-08-22: 30 g via INTRAVENOUS
  Filled 2016-08-22: qty 200

## 2016-08-22 MED ORDER — SODIUM CHLORIDE 0.9 % IV SOLN: INTRAVENOUS | Status: DC

## 2016-08-22 MED ORDER — DIPHENHYDRAMINE HCL 25 MG PO CAPS
ORAL_CAPSULE | ORAL | Status: AC
  Filled 2016-08-22: qty 1

## 2016-08-22 MED ORDER — DEXTROSE 5 % IV SOLN
INTRAVENOUS | Status: DC
  Administered 2016-08-22: 11:00:00 via INTRAVENOUS

## 2016-08-22 NOTE — Progress Notes (Signed)
  [facility] OFFICE PROGRESS NOTE   Diagnosis: CLL  INTERVAL HISTORY:   [name] returns as scheduled. She continues monthly IVIG. She believes this has decreased the frequency of infections. She had a syncope event and fall in October 2017. She fractured the right humerus. She was also diagnosed with a urinary tract infection. She reports having weak legs since this event. She was discharged to a skilled nursing facility. [name] has returned home. She lives alone. She was treated for urinary tract infection 08/03/2016, though a urine culture returned negative. Objective:  Vital signs in last 24 hours:  Blood pressure 121/78, pulse 89, temperature 98.1 F (36.7 C), temperature source Oral, resp. rate 17, height 5\' 4"  (1.626 m), weight 133 lb 9.6 oz (60.6 kg), SpO2 96 %.    Lymphatics: No cervical or supraclavicular nodes Resp: Lungs clear bilaterally Cardio: Regular rate and rhythm GI: No hepatosplenomegaly, no mass, nontender Vascular: No leg edema Neuro: Alert and oriented, follows commands, ambulate to the examination table without difficulty       Lab Results:  Lab Results  Component Value Date   WBC 6.7 08/22/2016   HGB 14.6 08/22/2016   HCT 43.4 08/22/2016   MCV 92.7 08/22/2016   PLT 145 08/22/2016   NEUTROABS 3.9 08/22/2016  Absolute lymphocyte count 2.2    Medications: I have reviewed the patient's current medications.  Assessment/Plan: 1. Chronic lymphocytic leukemia, asymptomatic. 2. History of hypogammaglobulinemia with recurrent infections. She continues monthly IVIG replacement therapy. 3. History of recurrent urinary tract infections.Coagulase-negative Staphylococcus infection August 2016 4. History of mild thrombocytopenia secondary to CLL. 5. Question right upper lobe nodule on chest x-ray 05/09/2016.    Disposition:  [name] is stable from a hematologic standpoint. There is no indication for treating the CLL at  present. She will continue monthly IVIG. She will return for an office visit in 3 months. We discussed her home living arrangement. It would ideal for her to have a family member or other help in the home. Her family can no longer stay with her. She has requested home health services via Dr. Judeen Hammans office.  Betsy Coder, MD  08/22/2016  9:29 AM

## 2016-08-22 NOTE — Patient Instructions (Signed)

## 2016-08-24 ENCOUNTER — Ambulatory Visit (INDEPENDENT_AMBULATORY_CARE_PROVIDER_SITE_OTHER)
Admission: RE | Admit: 2016-08-24 | Discharge: 2016-08-24 | Disposition: A | Discharge status: Home / Self Care | Payer: Medicare Other | Source: Ambulatory Visit | Attending: Internal Medicine | Admitting: Internal Medicine

## 2016-08-24 DIAGNOSIS — R1030 Lower abdominal pain, unspecified: Secondary | ICD-10-CM | POA: Diagnosis not present

## 2016-08-24 MED ORDER — IOPAMIDOL (ISOVUE-300) INJECTION 61%
100.0000 mL | Freq: Once | INTRAVENOUS | Status: AC | PRN
  Administered 2016-08-24: 100 mL via INTRAVENOUS

## 2016-08-25 ENCOUNTER — Encounter: Payer: Self-pay | Admitting: Internal Medicine

## 2016-08-28 ENCOUNTER — Other Ambulatory Visit: Payer: Self-pay | Admitting: Internal Medicine

## 2016-08-28 ENCOUNTER — Other Ambulatory Visit: Payer: Self-pay | Admitting: Family

## 2016-08-28 ENCOUNTER — Telehealth: Payer: Self-pay | Admitting: Internal Medicine

## 2016-08-28 NOTE — Progress Notes (Signed)
Pre visit review using our clinic review tool, if applicable. No additional management support is needed unless otherwise documented below in the visit note. 

## 2016-08-28 NOTE — Telephone Encounter (Signed)
Antonial, PT from [facility], called request verbal for PT to go see [name] 2 times a week for 5 wks. Please call him back

## 2016-08-28 NOTE — Progress Notes (Addendum)
Subjective:   [name] is a 83 y.o. female who presents for Medicare Annual (Subsequent) preventive examination.  Review of Systems:  No ROS.  Medicare Wellness Visit.  Cardiac Risk Factors include: sedentary lifestyle;advanced age (>50men, >50 women);hypertension;dyslipidemia;family history of premature cardiovascular disease Sleep patterns: no sleep issues, feels rested on waking and sleeps 9-10 hours nightly.   Home Safety/Smoke Alarms:  Feels safe in home. Smoke alarms in place.   Living environment; residence and Firearm Safety: 1-story house/ trailer, tub-shower, equipment: Cane, Type: [address] [place], [place], Type: Conservation officer, nature and bars in shower,  bath chair, no firearms. Lives alone, has life alert Seat Belt Safety/Bike Helmet: Wears seat belt.   Counseling:   Eye Exam- Has eye appointment scheduled this month Dr. Katy [facility]- Every 6 months  Female:   Pap-   N/A aged out    Mammo-  N/A aged out     Dexa scan- Referral placed today       CCS- Last 05/19/03 diverticulosis       Objective:     Vitals: BP 126/78   Pulse 67   Ht 5\' 4"  (1.626 m)   Wt 134 lb (60.8 kg)   SpO2 97%   BMI 23.00 kg/m   Body mass index is 23 kg/m.   Tobacco History  Smoking Status  . Never Smoker  Smokeless Tobacco  . Never Used     Counseling given: Not Answered   Past Medical History:  Diagnosis Date  . Allergy    rhinitis  . Chronic ethmoidal sinusitis   . Diverticulosis of colon   . Glaucoma   . Hx of colonic polyps   . Hyperlipidemia   . Hypertension   . Hypogammaglobulinemia ([place])    Monthly IVIG  . Leukemia ([place])    Chronic lymphocytic leukemia  . Osteoarthritis   . Peripheral neuropathy ([place])   . Pernicious anemia   . UTI (lower urinary tract infection) 2010  . Vitamin B12 deficiency    Past Surgical History:  Procedure Laterality Date  . ABDOMINAL HYSTERECTOMY  1987  . bilateral cataracts  2007  . bladder tack  1987  . L4-L5 Laminetomy   1999  . TUBAL LIGATION    . tubaligation  1961  . ureter reconstruction  1992   Family History  Problem Relation Age of Onset  . Jaundice Father   . Stroke Son 60    in NH  . Cancer Mother     uterine  . Diabetes Mother   . Other Sister     brain tumor  . Cancer Sister   . Hypertension Other   . Cancer Other     breast  . Thyroid disease Sister   . Cancer Sister     thyroid cancer   History  Sexual Activity  . Sexual activity: No    Outpatient Encounter Prescriptions as of 08/29/2016  Medication Sig  . acetaminophen (TYLENOL) 500 MG tablet Take 500 mg by mouth 2 (two) times daily.   Marland Kitchen amLODipine (NORVASC) 5 MG tablet TAKE 1 TABLET (5 MG TOTAL) BY MOUTH DAILY.  Marland Kitchen Ascorbic Acid (VITAMIN C PO) Take 1 tablet by mouth daily.  Marland Kitchen aspirin 81 MG EC tablet Take 81 mg by mouth daily.    . B-D 3CC LUER-LOK SYR 25GX1" 25G X 1" 3 ML MISC USE TO ADMINISTER B12 INJECTIONS  . B-D INS SYR ULTRAFINE 1CC/30G 30G X 1/2" 1 ML MISC USE TO ADMINISTER B12 INJECTIONS  .  Bioflavonoid Products (GRAPE SEED PO) Take 1 tablet by mouth daily.  Marland Kitchen conjugated estrogens (PREMARIN) vaginal cream 1/2 gram vaginally every night x 1 week, then twice weekly at bedtime  . cromolyn (OPTICROM) 4 % ophthalmic solution 1 DROP IN BOTH EYES FOUR TIMES A DAY  . cyanocobalamin (,VITAMIN B-12,) 1000 MCG/ML injection ADMINISTER 1 CC SUBQ EVERY 4 WEEKS AS DIRECTED  . fluticasone (FLONASE) 50 MCG/ACT nasal spray Place 2 sprays into both nostrils daily.  . folic acid (FOLVITE) 1 MG tablet Take 1 mg by mouth daily. For restless legs  . gabapentin (NEURONTIN) 300 MG capsule TAKE ONE CAPSULE BY MOUTH EVERY DAY  . GINSENG PO Take 1 tablet by mouth daily.  . hydrochlorothiazide (MICROZIDE) 12.5 MG capsule Take 1 capsule (12.5 mg total) by mouth daily.  . Insulin Syringe-Needle U-100 (B-D INS SYRINGE 0.5CC/30GX1/2") 30G X 1/2" 0.5 ML MISC by Does not apply route. To administer B12 injections   . latanoprost (XALATAN) 0.005 %  ophthalmic solution Place 1 drop into both eyes at bedtime.  Marland Kitchen loratadine (CLARITIN) 10 MG tablet TAKE 1 TABLET (10 MG TOTAL) BY MOUTH DAILY.  Marland Kitchen MAGNESIUM CHLORIDE PO Take 1 tablet by mouth daily.  . Multiple Vitamin (MULTI-DAY PO) Take 1 tablet by mouth daily.  Marland Kitchen nystatin (MYCOSTATIN) 100000 UNIT/ML suspension SWISH AND SPIT WITH 1 TEASPOONFUL 4 TO 5 TIMES DAILY  . pantoprazole (PROTONIX) 40 MG tablet Take 1 tablet (40 mg total) by mouth 2 (two) times daily.  . ranitidine (ZANTAC) 150 MG tablet TAKE 1 TABLET (150 MG TOTAL) BY MOUTH 2 (TWO) TIMES DAILY.  . simvastatin (ZOCOR) 20 MG tablet TAKE 1 TABLET (20 MG TOTAL) BY MOUTH DAILY. (Patient taking differently: TAKE 1 TABLET (10 MG TOTAL) BY MOUTH DAILY.)  . VITAMIN E PO Take 1 tablet by mouth daily.  . [DISCONTINUED] vitamin A 10000 UNIT capsule Take 10,000 Units by mouth daily.  Marland Kitchen HYDROcodone-acetaminophen (NORCO/VICODIN) 5-325 MG tablet Take 1-2 tablets by mouth every 6 (six) hours as needed for moderate pain or severe pain. (Patient not taking: Reported on 08/29/2016)  . KLOR-CON M10 10 MEQ tablet TAKE 1 TABLET (10 MEQ TOTAL) BY MOUTH DAILY.  Marland Kitchen omeprazole (PRILOSEC) 40 MG capsule TAKE ONE CAPSULE BY MOUTH EVERY DAY (Patient not taking: Reported on 08/29/2016)   No facility-administered encounter medications on file as of 08/29/2016.     Activities of Daily Living In your present state of health, do you have any difficulty performing the following activities: 08/29/2016 05/10/2016  Hearing? N N  Vision? N N  Difficulty concentrating or making decisions? Y N  Walking or climbing stairs? (No Data) N  Dressing or bathing? N Y  Doing errands, shopping? Y N  Preparing Food and eating ? N -  Using the Toilet? N -  In the past six months, have you accidently leaked urine? Y -  Do you have problems with loss of bowel control? N -  Managing your Medications? N -  Managing your Finances? N -  Housekeeping or managing your Housekeeping? Y -  Some  recent data might be hidden    Patient Care Team: Cassandria Anger, MD as PCP - General    Assessment:    Physical assessment deferred to PCP.  Exercise Activities and Dietary recommendations Current Exercise Habits: The patient does not participate in regular exercise at present, Exercise limited by: orthopedic condition(s) ([facility] for 5 weeks x3 weekly)  Diet (meal preparation, eat out, water intake, caffeinated beverages, dairy  products, fruits and vegetables): in general, a "healthy" diet   4-5  cups of water per day. Breakfast: cereal, coffee, eggs, toast Lunch: chicken, can vegetables unsalted, frozen vegetables. Dinner: light dinner, fiber bar, light due to acid reflux      Encouraged to continue heart healthy meals and to remain as active as possible.  Goals    . to stay at home as long as I  can          Plan to work with physical therapy to increase strength. To go back to church.      Fall Risk Fall Risk  08/29/2016 12/03/2015 10/01/2014 04/01/2014  Falls in the past year? Yes No No No  Number falls in past yr: 2 or more - - -  Injury with Fall? Yes - - -  Risk Factor Category  High Fall Risk - - -  Risk for fall due to : Impaired balance/gait;Impaired mobility;Impaired vision - - -  Follow up Education provided - - -   Depression Screen PHQ 2/9 Scores 08/29/2016 12/03/2015 10/01/2014  PHQ - 2 Score 1 0 0     Cognitive Function MMSE - Mini Mental State Exam 08/29/2016  Orientation to time 5  Orientation to Place 5  Registration 3  Attention/ Calculation 5  Recall 3  Language- name 2 objects 2  Language- repeat 1  Language- follow 3 step command 3  Language- read & follow direction 1  Write a sentence 1  Copy design 1  Total score 30        Immunization History  Administered Date(s) Administered  . Influenza Split 05/02/2012  . Influenza Whole 05/18/2008, 06/01/2010, 05/03/2011  . Influenza,inj,Quad PF,36+ Mos 05/01/2013, 03/12/2014, 04/29/2015  .  Influenza-Unspecified 05/22/2016  . Pneumococcal Conjugate-13 04/01/2014  . Pneumococcal Polysaccharide-23 04/07/2004, 02/21/2012  . Td 06/08/2010   Screening Tests Health Maintenance  Topic Date Due  . ZOSTAVAX  07/21/1988  . DEXA SCAN  07/21/1993  . TETANUS/TDAP  06/08/2020  . INFLUENZA VACCINE  Completed  . PNA vac Low Risk Adult  Completed      Plan:     Continue to eat heart healthy diet (full of fruits, vegetables, whole grains, lean protein, water--limit salt, fat, and sugar intake) and increase physical activity as tolerated.  Continue doing brain stimulating activities (puzzles, reading, adult coloring books, staying active) to keep memory sharp.   Elevate you legs at rest to help decrease ankle and feet swelling. Advised to call for an appointment if symptoms persist.  During the course of the visit the patient was educated and counseled about the following appropriate screening and preventive services:   Vaccines to include Pneumoccal, Influenza, Hepatitis B, Td, Zostavax, HCV  Cardiovascular Disease  Colorectal cancer screening  Bone density screening  Diabetes screening  Glaucoma screening  Mammography/PAP  Nutrition counseling   Patient Instructions (the written plan) was given to the patient.   Michiel Cowboy, RN  08/29/2016  Medical screening examination/treatment/procedure(s) were performed by non-physician practitioner and as supervising physician I was immediately available for consultation/collaboration. I agree with above. Walker Kehr, MD

## 2016-08-29 ENCOUNTER — Other Ambulatory Visit: Payer: Self-pay

## 2016-08-29 ENCOUNTER — Ambulatory Visit (INDEPENDENT_AMBULATORY_CARE_PROVIDER_SITE_OTHER): Payer: Medicare Other | Admitting: *Deleted

## 2016-08-29 VITALS — BP 126/78 | HR 67 | Ht 64.0 in | Wt 134.0 lb

## 2016-08-29 DIAGNOSIS — Z0000 Encounter for general adult medical examination without abnormal findings: Secondary | ICD-10-CM | POA: Diagnosis not present

## 2016-08-29 DIAGNOSIS — E538 Deficiency of other specified B group vitamins: Secondary | ICD-10-CM

## 2016-08-29 DIAGNOSIS — E2839 Other primary ovarian failure: Secondary | ICD-10-CM | POA: Diagnosis not present

## 2016-08-29 MED ORDER — "INSULIN SYRINGE-NEEDLE U-100 30G X 1/2"" 1 ML MISC": 1 refills | Status: DC

## 2016-08-29 NOTE — Telephone Encounter (Signed)
Tried to contact [name], mailbox is full, I will keep trying to reach

## 2016-08-29 NOTE — Telephone Encounter (Signed)
Routing to dr plotnikov---please advise if you are ok with these orders, I will call home health back, thanks

## 2016-08-29 NOTE — Patient Instructions (Addendum)
Continue to eat heart healthy diet (full of fruits, vegetables, whole grains, lean protein, water--limit salt, fat, and sugar intake) and increase physical activity as tolerated.  Continue doing brain stimulating activities (puzzles, reading, adult coloring books, staying active) to keep memory sharp.   Elevate you legs at rest to help decrease ankle and feet swelling Bone Densitometry Introduction Bone densitometry is an imaging test that uses a special X-ray to measure the amount of calcium and other minerals in your bones (bone density). This test is also known as a bone mineral density test or dual-energy X-ray absorptiometry (DXA). The test can measure bone density at your hip and your spine. It is similar to having a regular X-ray. You may have this test to:  Diagnose a condition that causes weak or thin bones (osteoporosis).  Predict your risk of a broken bone (fracture).  Determine how well osteoporosis treatment is working. Tell a health care provider about:  Any allergies you have.  All medicines you are taking, including vitamins, herbs, eye drops, creams, and over-the-counter medicines.  Any problems you or family members have had with anesthetic medicines.  Any blood disorders you have.  Any surgeries you have had.  Any medical conditions you have.  Possibility of pregnancy.  Any other medical test you had within the previous 14 days that used contrast material. What are the risks? Generally, this is a safe procedure. However, problems can occur and may include the following:  This test exposes you to a very small amount of radiation.  The risks of radiation exposure may be greater to unborn children. What happens before the procedure?  Do not take any calcium supplements for 24 hours before having the test. You can otherwise eat and drink what you usually do.  Take off all metal jewelry, eyeglasses, dental appliances, and any other metal objects. What happens  during the procedure?  You may lie on an exam table. There will be an X-ray generator below you and an imaging device above you.  Other devices, such as boxes or braces, may be used to position your body properly for the scan.  You will need to lie still while the machine slowly scans your body.  The images will show up on a computer monitor. What happens after the procedure? You may need more testing at a later time. This information is not intended to replace advice given to you by your health care provider. Make sure you discuss any questions you have with your health care provider. Document Released: 08/01/2004 Document Revised: 12/16/2015 Document Reviewed: 12/18/2013  2017 Elsevier   Fall Prevention in the Home Introduction Falls can cause injuries. They can happen to people of all ages. There are many things you can do to make your home safe and to help prevent falls. What can I do on the outside of my home?  Regularly fix the edges of walkways and driveways and fix any cracks.  Remove anything that might make you trip as you walk through a door, such as a raised step or threshold.  Trim any bushes or trees on the path to your home.  Use bright outdoor lighting.  Clear any walking paths of anything that might make someone trip, such as rocks or tools.  Regularly check to see if handrails are loose or broken. Make sure that both sides of any steps have handrails.  Any raised decks and porches should have guardrails on the edges.  Have any leaves, snow, or ice cleared regularly.  Use sand or salt on walking paths during winter.  Clean up any spills in your garage right away. This includes oil or grease spills. What can I do in the bathroom?  Use night lights.  Install grab bars by the toilet and in the tub and shower. Do not use towel bars as grab bars.  Use non-skid mats or decals in the tub or shower.  If you need to sit down in the shower, use a plastic,  non-slip stool.  Keep the floor dry. Clean up any water that spills on the floor as soon as it happens.  Remove soap buildup in the tub or shower regularly.  Attach bath mats securely with double-sided non-slip rug tape.  Do not have throw rugs and other things on the floor that can make you trip. What can I do in the bedroom?  Use night lights.  Make sure that you have a light by your bed that is easy to reach.  Do not use any sheets or blankets that are too big for your bed. They should not hang down onto the floor.  Have a firm chair that has side arms. You can use this for support while you get dressed.  Do not have throw rugs and other things on the floor that can make you trip. What can I do in the kitchen?  Clean up any spills right away.  Avoid walking on wet floors.  Keep items that you use a lot in easy-to-reach places.  If you need to reach something above you, use a strong step stool that has a grab bar.  Keep electrical cords out of the way.  Do not use floor polish or wax that makes floors slippery. If you must use wax, use non-skid floor wax.  Do not have throw rugs and other things on the floor that can make you trip. What can I do with my stairs?  Do not leave any items on the stairs.  Make sure that there are handrails on both sides of the stairs and use them. Fix handrails that are broken or loose. Make sure that handrails are as long as the stairways.  Check any carpeting to make sure that it is firmly attached to the stairs. Fix any carpet that is loose or worn.  Avoid having throw rugs at the top or bottom of the stairs. If you do have throw rugs, attach them to the floor with carpet tape.  Make sure that you have a light switch at the top of the stairs and the bottom of the stairs. If you do not have them, ask someone to add them for you. What else can I do to help prevent falls?  Wear shoes that:  Do not have high heels.  Have rubber  bottoms.  Are comfortable and fit you well.  Are closed at the toe. Do not wear sandals.  If you use a stepladder:  Make sure that it is fully opened. Do not climb a closed stepladder.  Make sure that both sides of the stepladder are locked into place.  Ask someone to hold it for you, if possible.  Clearly mark and make sure that you can see:  Any grab bars or handrails.  First and last steps.  Where the edge of each step is.  Use tools that help you move around (mobility aids) if they are needed. These include:  Canes.  Walkers.  Scooters.  Crutches.  Turn on the lights when you go  into a dark area. Replace any light bulbs as soon as they burn out.  Set up your furniture so you have a clear path. Avoid moving your furniture around.  If any of your floors are uneven, fix them.  If there are any pets around you, be aware of where they are.  Review your medicines with your doctor. Some medicines can make you feel dizzy. This can increase your chance of falling. Ask your doctor what other things that you can do to help prevent falls. This information is not intended to replace advice given to you by your health care provider. Make sure you discuss any questions you have with your health care provider. Document Released: 05/06/2009 Document Revised: 12/16/2015 Document Reviewed: 08/14/2014  2017 Elsevier  Health Maintenance, Female Introduction Adopting a healthy lifestyle and getting preventive care can go a long way to promote health and wellness. Talk with your health care provider about what schedule of regular examinations is right for you. This is a good chance for you to check in with your provider about disease prevention and staying healthy. In between checkups, there are plenty of things you can do on your own. Experts have done a lot of research about which lifestyle changes and preventive measures are most likely to keep you healthy. Ask your health care  provider for more information. Weight and diet Eat a healthy diet  Be sure to include plenty of vegetables, fruits, low-fat dairy products, and lean protein.  Do not eat a lot of foods high in solid fats, added sugars, or salt.  Get regular exercise. This is one of the most important things you can do for your health.  Most adults should exercise for at least 150 minutes each week. The exercise should increase your heart rate and make you sweat (moderate-intensity exercise).  Most adults should also do strengthening exercises at least twice a week. This is in addition to the moderate-intensity exercise. Maintain a healthy weight  Body mass index (BMI) is a measurement that can be used to identify possible weight problems. It estimates body fat based on height and weight. Your health care provider can help determine your BMI and help you achieve or maintain a healthy weight.  For females 12 years of age and older:  A BMI below 18.5 is considered underweight.  A BMI of 18.5 to 24.9 is normal.  A BMI of 25 to 29.9 is considered overweight.  A BMI of 30 and above is considered obese. Watch levels of cholesterol and blood lipids  You should start having your blood tested for lipids and cholesterol at 81 years of age, then have this test every 5 years.  You may need to have your cholesterol levels checked more often if:  Your lipid or cholesterol levels are high.  You are older than 81 years of age.  You are at high risk for heart disease. Cancer screening Lung Cancer  Lung cancer screening is recommended for adults 40-52 years old who are at high risk for lung cancer because of a history of smoking.  A yearly low-dose CT scan of the lungs is recommended for people who:  Currently smoke.  Have quit within the past 15 years.  Have at least a 30-pack-year history of smoking. A pack year is smoking an average of one pack of cigarettes a day for 1 year.  Yearly screening  should continue until it has been 15 years since you quit.  Yearly screening should stop if you  develop a health problem that would prevent you from having lung cancer treatment. Breast Cancer  Practice breast self-awareness. This means understanding how your breasts normally appear and feel.  It also means doing regular breast self-exams. Let your health care provider know about any changes, no matter how small.  If you are in your 20s or 30s, you should have a clinical breast exam (CBE) by a health care provider every 1-3 years as part of a regular health exam.  If you are 71 or older, have a CBE every year. Also consider having a breast X-ray (mammogram) every year.  If you have a family history of breast cancer, talk to your health care provider about genetic screening.  If you are at high risk for breast cancer, talk to your health care provider about having an MRI and a mammogram every year.  Breast cancer gene (BRCA) assessment is recommended for women who have family members with BRCA-related cancers. BRCA-related cancers include:  Breast.  Ovarian.  Tubal.  Peritoneal cancers.  Results of the assessment will determine the need for genetic counseling and BRCA1 and BRCA2 testing. Cervical Cancer  Your health care provider may recommend that you be screened regularly for cancer of the pelvic organs (ovaries, uterus, and vagina). This screening involves a pelvic examination, including checking for microscopic changes to the surface of your cervix (Pap test). You may be encouraged to have this screening done every 3 years, beginning at age 2.  For women ages 64-65, health care providers may recommend pelvic exams and Pap testing every 3 years, or they may recommend the Pap and pelvic exam, combined with testing for human papilloma virus (HPV), every 5 years. Some types of HPV increase your risk of cervical cancer. Testing for HPV may also be done on women of any age with unclear  Pap test results.  Other health care providers may not recommend any screening for nonpregnant women who are considered low risk for pelvic cancer and who do not have symptoms. Ask your health care provider if a screening pelvic exam is right for you.  If you have had past treatment for cervical cancer or a condition that could lead to cancer, you need Pap tests and screening for cancer for at least 20 years after your treatment. If Pap tests have been discontinued, your risk factors (such as having a new sexual partner) need to be reassessed to determine if screening should resume. Some women have medical problems that increase the chance of getting cervical cancer. In these cases, your health care provider may recommend more frequent screening and Pap tests. Colorectal Cancer  This type of cancer can be detected and often prevented.  Routine colorectal cancer screening usually begins at 81 years of age and continues through 81 years of age.  Your health care provider may recommend screening at an earlier age if you have risk factors for colon cancer.  Your health care provider may also recommend using home test kits to check for hidden blood in the stool.  A small camera at the end of a tube can be used to examine your colon directly (sigmoidoscopy or colonoscopy). This is done to check for the earliest forms of colorectal cancer.  Routine screening usually begins at age 15.  Direct examination of the colon should be repeated every 5-10 years through 81 years of age. However, you may need to be screened more often if early forms of precancerous polyps or small growths are found. Skin Cancer  Check your skin from head to toe regularly.  Tell your health care provider about any new moles or changes in moles, especially if there is a change in a mole's shape or color.  Also tell your health care provider if you have a mole that is larger than the size of a pencil eraser.  Always use  sunscreen. Apply sunscreen liberally and repeatedly throughout the day.  Protect yourself by wearing long sleeves, pants, a wide-brimmed hat, and sunglasses whenever you are outside. Heart disease, diabetes, and high blood pressure  High blood pressure causes heart disease and increases the risk of stroke. High blood pressure is more likely to develop in:  People who have blood pressure in the high end of the normal range (130-139/85-89 mm Hg).  People who are overweight or obese.  People who are African American.  If you are 49-3 years of age, have your blood pressure checked every 3-5 years. If you are 58 years of age or older, have your blood pressure checked every year. You should have your blood pressure measured twice-once when you are at a hospital or clinic, and once when you are not at a hospital or clinic. Record the average of the two measurements. To check your blood pressure when you are not at a hospital or clinic, you can use:  An automated blood pressure machine at a pharmacy.  A home blood pressure monitor.  If you are between 38 years and 44 years old, ask your health care provider if you should take aspirin to prevent strokes.  Have regular diabetes screenings. This involves taking a blood sample to check your fasting blood sugar level.  If you are at a normal weight and have a low risk for diabetes, have this test once every three years after 81 years of age.  If you are overweight and have a high risk for diabetes, consider being tested at a younger age or more often. Preventing infection Hepatitis B  If you have a higher risk for hepatitis B, you should be screened for this virus. You are considered at high risk for hepatitis B if:  You were born in a country where hepatitis B is common. Ask your health care provider which countries are considered high risk.  Your parents were born in a high-risk country, and you have not been immunized against hepatitis B  (hepatitis B vaccine).  You have HIV or AIDS.  You use needles to inject street drugs.  You live with someone who has hepatitis B.  You have had sex with someone who has hepatitis B.  You get hemodialysis treatment.  You take certain medicines for conditions, including cancer, organ transplantation, and autoimmune conditions. Hepatitis C  Blood testing is recommended for:  Everyone born from [birth year] through 1963.  Anyone with known risk factors for hepatitis C. Sexually transmitted infections (STIs)  You should be screened for sexually transmitted infections (STIs) including gonorrhea and chlamydia if:  You are sexually active and are younger than 81 years of age.  You are older than 81 years of age and your health care provider tells you that you are at risk for this type of infection.  Your sexual activity has changed since you were last screened and you are at an increased risk for chlamydia or gonorrhea. Ask your health care provider if you are at risk.  If you do not have HIV, but are at risk, it may be recommended that you take a prescription medicine daily to  prevent HIV infection. This is called pre-exposure prophylaxis (PrEP). You are considered at risk if:  You are sexually active and do not regularly use condoms or know the HIV status of your partner(s).  You take drugs by injection.  You are sexually active with a partner who has HIV. Talk with your health care provider about whether you are at high risk of being infected with HIV. If you choose to begin PrEP, you should first be tested for HIV. You should then be tested every 3 months for as long as you are taking PrEP. Pregnancy  If you are premenopausal and you may become pregnant, ask your health care provider about preconception counseling.  If you may become pregnant, take 400 to 800 micrograms (mcg) of folic acid every day.  If you want to prevent pregnancy, talk to your health care provider about birth  control (contraception). Osteoporosis and menopause  Osteoporosis is a disease in which the bones lose minerals and strength with aging. This can result in serious bone fractures. Your risk for osteoporosis can be identified using a bone density scan.  If you are 13 years of age or older, or if you are at risk for osteoporosis and fractures, ask your health care provider if you should be screened.  Ask your health care provider whether you should take a calcium or vitamin D supplement to lower your risk for osteoporosis.  Menopause may have certain physical symptoms and risks.  Hormone replacement therapy may reduce some of these symptoms and risks. Talk to your health care provider about whether hormone replacement therapy is right for you. Follow these instructions at home:  Schedule regular health, dental, and eye exams.  Stay current with your immunizations.  Do not use any tobacco products including cigarettes, chewing tobacco, or electronic cigarettes.  If you are pregnant, do not drink alcohol.  If you are breastfeeding, limit how much and how often you drink alcohol.  Limit alcohol intake to no more than 1 drink per day for nonpregnant women. One drink equals 12 ounces of beer, 5 ounces of [place]ued[place], or 1 ounces of hard liquor.  Do not use street drugs.  Do not share needles.  Ask your health care provider for help if you need support or information about quitting drugs.  Tell your health care provider if you often feel depressed.  Tell your health care provider if you have ever been abused or do not feel safe at home. This information is not intended to replace advice given to you by your health care provider. Make sure you discuss any questions you have with your health care provider. Document Released: 01/23/2011 Document Revised: 12/16/2015 Document Reviewed: 04/13/2015  2017 Elsevier

## 2016-08-29 NOTE — Telephone Encounter (Signed)
[name] telephone orders ok for PT per dr plotnikovs note

## 2016-08-29 NOTE — Telephone Encounter (Signed)
OK. Thx

## 2016-08-31 ENCOUNTER — Telehealth: Payer: Self-pay | Admitting: Oncology

## 2016-08-31 NOTE — Telephone Encounter (Signed)
I called patient and confirmed all appointments but she said she needs the 11/14/2016 appointments a little later because she rides with [facility] and they can't get her here till 9:30 or after

## 2016-09-11 DIAGNOSIS — Z87440 Personal history of urinary (tract) infections: Secondary | ICD-10-CM

## 2016-09-11 DIAGNOSIS — M1990 Unspecified osteoarthritis, unspecified site: Secondary | ICD-10-CM

## 2016-09-11 DIAGNOSIS — R609 Edema, unspecified: Secondary | ICD-10-CM | POA: Diagnosis not present

## 2016-09-11 DIAGNOSIS — Z9181 History of falling: Secondary | ICD-10-CM

## 2016-09-11 DIAGNOSIS — H409 Unspecified glaucoma: Secondary | ICD-10-CM

## 2016-09-11 DIAGNOSIS — I10 Essential (primary) hypertension: Secondary | ICD-10-CM

## 2016-09-11 DIAGNOSIS — D510 Vitamin B12 deficiency anemia due to intrinsic factor deficiency: Secondary | ICD-10-CM

## 2016-09-11 DIAGNOSIS — R531 Weakness: Secondary | ICD-10-CM

## 2016-09-11 DIAGNOSIS — R1030 Lower abdominal pain, unspecified: Secondary | ICD-10-CM | POA: Diagnosis not present

## 2016-09-11 DIAGNOSIS — C9110 Chronic lymphocytic leukemia of B-cell type not having achieved remission: Secondary | ICD-10-CM

## 2016-09-11 DIAGNOSIS — Z7982 Long term (current) use of aspirin: Secondary | ICD-10-CM

## 2016-09-11 DIAGNOSIS — R2689 Other abnormalities of gait and mobility: Secondary | ICD-10-CM | POA: Diagnosis not present

## 2016-09-11 DIAGNOSIS — G609 Hereditary and idiopathic neuropathy, unspecified: Secondary | ICD-10-CM

## 2016-09-11 DIAGNOSIS — R350 Frequency of micturition: Secondary | ICD-10-CM | POA: Diagnosis not present

## 2016-09-13 ENCOUNTER — Other Ambulatory Visit: Payer: Self-pay | Admitting: *Deleted

## 2016-09-13 MED ORDER — GABAPENTIN 300 MG PO CAPS: 300.0000 mg | ORAL_CAPSULE | Freq: Every day | ORAL | 1 refills | Status: DC

## 2016-09-16 ENCOUNTER — Other Ambulatory Visit: Payer: Self-pay | Admitting: Internal Medicine

## 2016-09-19 ENCOUNTER — Other Ambulatory Visit: Payer: Self-pay | Admitting: Internal Medicine

## 2016-09-19 ENCOUNTER — Telehealth: Payer: Self-pay | Admitting: Oncology

## 2016-09-19 ENCOUNTER — Ambulatory Visit (HOSPITAL_BASED_OUTPATIENT_CLINIC_OR_DEPARTMENT_OTHER): Payer: Medicare Other | Admitting: Oncology

## 2016-09-19 ENCOUNTER — Other Ambulatory Visit (HOSPITAL_BASED_OUTPATIENT_CLINIC_OR_DEPARTMENT_OTHER): Payer: Medicare Other

## 2016-09-19 ENCOUNTER — Ambulatory Visit (HOSPITAL_COMMUNITY)
Admission: RE | Admit: 2016-09-19 | Discharge: 2016-09-19 | Disposition: A | Discharge status: Home / Self Care | Payer: Medicare Other | Source: Ambulatory Visit | Attending: Oncology | Admitting: Oncology

## 2016-09-19 ENCOUNTER — Ambulatory Visit (HOSPITAL_BASED_OUTPATIENT_CLINIC_OR_DEPARTMENT_OTHER): Payer: Medicare Other

## 2016-09-19 VITALS — BP 131/68 | HR 89 | Temp 97.8°F | Resp 17 | Wt 134.2 lb

## 2016-09-19 VITALS — BP 123/66 | HR 64 | Temp 97.8°F | Resp 16

## 2016-09-19 DIAGNOSIS — C9110 Chronic lymphocytic leukemia of B-cell type not having achieved remission: Secondary | ICD-10-CM

## 2016-09-19 DIAGNOSIS — R918 Other nonspecific abnormal finding of lung field: Secondary | ICD-10-CM | POA: Insufficient documentation

## 2016-09-19 DIAGNOSIS — D801 Nonfamilial hypogammaglobulinemia: Secondary | ICD-10-CM | POA: Diagnosis not present

## 2016-09-19 DIAGNOSIS — R0981 Nasal congestion: Secondary | ICD-10-CM

## 2016-09-19 LAB — CBC WITH DIFFERENTIAL/PLATELET
BASO%: 0.4 % (ref 0.0–2.0)
BASOS ABS: 0 10*3/uL (ref 0.0–0.1)
EOS ABS: 0.1 10*3/uL (ref 0.0–0.5)
EOS%: 1.4 % (ref 0.0–7.0)
HCT: 41.8 % (ref 34.8–46.6)
HGB: 14.4 g/dL (ref 11.6–15.9)
LYMPH%: 31.8 % (ref 14.0–49.7)
MCH: 31.7 pg (ref 25.1–34.0)
MCHC: 34.4 g/dL (ref 31.5–36.0)
MCV: 92.1 fL (ref 79.5–101.0)
MONO#: 0.4 10*3/uL (ref 0.1–0.9)
MONO%: 7.8 % (ref 0.0–14.0)
NEUT#: 3.1 10*3/uL (ref 1.5–6.5)
NEUT%: 58.6 % (ref 38.4–76.8)
PLATELETS: 147 10*3/uL (ref 145–400)
RBC: 4.54 10*6/uL (ref 3.70–5.45)
RDW: 14.2 % (ref 11.2–14.5)
WBC: 5.2 10*3/uL (ref 3.9–10.3)
lymph#: 1.7 10*3/uL (ref 0.9–3.3)

## 2016-09-19 MED ORDER — DIPHENHYDRAMINE HCL 25 MG PO CAPS
ORAL_CAPSULE | ORAL | Status: AC
  Filled 2016-09-19: qty 1

## 2016-09-19 MED ORDER — DIPHENHYDRAMINE HCL 25 MG PO CAPS
25.0000 mg | ORAL_CAPSULE | Freq: Once | ORAL | Status: AC
  Administered 2016-09-19: 25 mg via ORAL

## 2016-09-19 MED ORDER — IMMUNE GLOBULIN (HUMAN) 10 GM/100ML IV SOLN
30.0000 g | Freq: Once | INTRAVENOUS | Status: AC
  Administered 2016-09-19: 30 g via INTRAVENOUS
  Filled 2016-09-19: qty 100

## 2016-09-19 NOTE — Progress Notes (Signed)
  [place] OFFICE PROGRESS NOTE   Diagnosis: CLL  INTERVAL HISTORY:   She last received IVIG on 08/22/2016. No recent infection. No falls.   Objective:  Vital signs in last 24 hours:  Blood pressure 131/68, pulse 89, temperature 97.8 F (36.6 C), temperature source Oral, resp. rate 17, weight 134 lb 3.2 oz (60.9 kg), SpO2 98 %.    HEENT: Neck without mass Lymphatics: No cervical or supraclavicular nodes Resp: Lungs clear bilaterally Cardio: Regular rate and rhythm GI: No hepatosplenomegaly Vascular: No leg edema   Lab Results:  Lab Results  Component Value Date   WBC 5.2 09/19/2016   HGB 14.4 09/19/2016   HCT 41.8 09/19/2016   MCV 92.1 09/19/2016   PLT 147 09/19/2016   NEUTROABS 3.1 09/19/2016     Medications: I have reviewed the patient's current medications.  Assessment/Plan: 1. Chronic lymphocytic leukemia, asymptomatic. 2. History of hypogammaglobulinemia with recurrent infections. She continues monthly IVIG replacement therapy. 3. History of recurrent urinary tract infections.Coagulase-negative Staphylococcus infection August 2016 4. History of mild thrombocytopenia secondary to CLL. 5. Question right upper lobe nodule on chest x-ray 05/09/2016.    Disposition:  She appears unchanged. She will continue monthly IVIG. We will follow-up on the IgG level from today. We will obtain a chest x-ray to follow-up on the questionable right lung nodule seen on chest x-rays last fall.  She will return for IVIg in one month and an office visit in 2 months.  15 minutes were spent with the patient today. The majority of the time was used for counseling and coordination of care.  Betsy Coder, MD  09/19/2016  10:54 AM

## 2016-09-19 NOTE — Patient Instructions (Signed)

## 2016-09-19 NOTE — Telephone Encounter (Signed)
Appointments complete per 2/27 los. Patient to get print out in infusion. Infusion also made aware patient is to proceed to [facility] radiology after infusion for cxr - order in EPIC.

## 2016-09-20 LAB — IGG, IGA, IGM
IGM (IMMUNOGLOBIN M), SRM: 49 mg/dL (ref 26–217)
IgA, Qn, Serum: 252 mg/dL (ref 64–422)
IgG, Qn, Serum: 1199 mg/dL (ref 700–1600)

## 2016-10-02 ENCOUNTER — Telehealth: Payer: Self-pay | Admitting: *Deleted

## 2016-10-02 ENCOUNTER — Telehealth: Payer: Self-pay

## 2016-10-02 ENCOUNTER — Other Ambulatory Visit: Payer: Self-pay | Admitting: *Deleted

## 2016-10-02 ENCOUNTER — Ambulatory Visit: Payer: Medicare Other | Admitting: Internal Medicine

## 2016-10-02 DIAGNOSIS — C9110 Chronic lymphocytic leukemia of B-cell type not having achieved remission: Secondary | ICD-10-CM

## 2016-10-02 NOTE — Telephone Encounter (Signed)
-----   Message from Ladell Pier, MD sent at 09/29/2016  4:30 PM EST ----- Reviewed cxr in radiology, nodular area in RUL was present in 2016 Please scheduled f/u cxr in 3 months on same day as IVIG

## 2016-10-02 NOTE — Telephone Encounter (Signed)
Called pt with chest Xray result. She voiced understanding. Informed her of need for repeat xray in 3 mos. She will need to be reminded closer to the June infusion appt.

## 2016-10-02 NOTE — Telephone Encounter (Addendum)
Pt called to clarify xray appt June 19. She was not able to get to calendar when [facility] called. Explained she can get it before her infusion appt.  She wrote it on her calendar.

## 2016-10-03 ENCOUNTER — Ambulatory Visit: Payer: Medicare Other | Admitting: Internal Medicine

## 2016-10-03 ENCOUNTER — Inpatient Hospital Stay: Admission: RE | Admit: 2016-10-03 | Payer: Medicare Other | Source: Ambulatory Visit

## 2016-10-11 ENCOUNTER — Telehealth: Payer: Self-pay | Admitting: *Deleted

## 2016-10-11 MED ORDER — PANTOPRAZOLE SODIUM 40 MG PO TBEC: 40.0000 mg | DELAYED_RELEASE_TABLET | Freq: Two times a day (BID) | ORAL | 11 refills | Status: DC

## 2016-10-11 NOTE — Telephone Encounter (Signed)
Duplicate...[name]

## 2016-10-11 NOTE — Telephone Encounter (Signed)
Rec'd call pt states she wanting to know if she need to continue taking both pantoprazole and ranitidine. Inform pt per chart Ranitidine was added back in January if she feels pantoprazole is controlling symptoms to continue taking. She has not had any of the ranitidine she will keep taking pantoprazole. Inform pt will send updated script to [facility].../lmb

## 2016-10-17 ENCOUNTER — Other Ambulatory Visit (HOSPITAL_BASED_OUTPATIENT_CLINIC_OR_DEPARTMENT_OTHER): Payer: Medicare Other

## 2016-10-17 ENCOUNTER — Ambulatory Visit (HOSPITAL_BASED_OUTPATIENT_CLINIC_OR_DEPARTMENT_OTHER): Payer: Medicare Other

## 2016-10-17 VITALS — BP 137/79 | HR 64 | Temp 98.5°F | Resp 17

## 2016-10-17 DIAGNOSIS — C9110 Chronic lymphocytic leukemia of B-cell type not having achieved remission: Secondary | ICD-10-CM

## 2016-10-17 DIAGNOSIS — D801 Nonfamilial hypogammaglobulinemia: Secondary | ICD-10-CM

## 2016-10-17 LAB — CBC WITH DIFFERENTIAL/PLATELET
BASO%: 0.6 % (ref 0.0–2.0)
Basophils Absolute: 0 10*3/uL (ref 0.0–0.1)
EOS%: 1 % (ref 0.0–7.0)
Eosinophils Absolute: 0.1 10*3/uL (ref 0.0–0.5)
HEMATOCRIT: 39 % (ref 34.8–46.6)
HGB: 13.3 g/dL (ref 11.6–15.9)
LYMPH%: 28.2 % (ref 14.0–49.7)
MCH: 31.9 pg (ref 25.1–34.0)
MCHC: 34 g/dL (ref 31.5–36.0)
MCV: 93.9 fL (ref 79.5–101.0)
MONO#: 0.4 10*3/uL (ref 0.1–0.9)
MONO%: 7 % (ref 0.0–14.0)
NEUT#: 3.6 10*3/uL (ref 1.5–6.5)
NEUT%: 63.2 % (ref 38.4–76.8)
PLATELETS: 147 10*3/uL (ref 145–400)
RBC: 4.15 10*6/uL (ref 3.70–5.45)
RDW: 14 % (ref 11.2–14.5)
WBC: 5.6 10*3/uL (ref 3.9–10.3)
lymph#: 1.6 10*3/uL (ref 0.9–3.3)

## 2016-10-17 MED ORDER — DIPHENHYDRAMINE HCL 25 MG PO CAPS
25.0000 mg | ORAL_CAPSULE | Freq: Once | ORAL | Status: AC
  Administered 2016-10-17: 25 mg via ORAL

## 2016-10-17 MED ORDER — DIPHENHYDRAMINE HCL 25 MG PO CAPS
ORAL_CAPSULE | ORAL | Status: AC
  Filled 2016-10-17: qty 1

## 2016-10-17 MED ORDER — IMMUNE GLOBULIN (HUMAN) 10 GM/100ML IV SOLN
30.0000 g | Freq: Once | INTRAVENOUS | Status: AC
  Administered 2016-10-17: 30 g via INTRAVENOUS
  Filled 2016-10-17: qty 200

## 2016-10-17 MED ORDER — SODIUM CHLORIDE 0.9 % IV SOLN
INTRAVENOUS | Status: DC
  Administered 2016-10-17: 11:00:00 via INTRAVENOUS

## 2016-10-17 NOTE — Patient Instructions (Signed)

## 2016-10-18 ENCOUNTER — Telehealth: Payer: Self-pay | Admitting: Oncology

## 2016-10-18 NOTE — Telephone Encounter (Signed)
Patient called to have appointments scheduled at an earlier time per transportation availability.

## 2016-10-24 ENCOUNTER — Ambulatory Visit (INDEPENDENT_AMBULATORY_CARE_PROVIDER_SITE_OTHER)
Admission: RE | Admit: 2016-10-24 | Discharge: 2016-10-24 | Disposition: A | Discharge status: Home / Self Care | Payer: Medicare Other | Source: Ambulatory Visit | Attending: Internal Medicine | Admitting: Internal Medicine

## 2016-10-24 ENCOUNTER — Encounter: Payer: Self-pay | Admitting: Internal Medicine

## 2016-10-24 ENCOUNTER — Other Ambulatory Visit: Payer: Medicare Other

## 2016-10-24 ENCOUNTER — Ambulatory Visit (INDEPENDENT_AMBULATORY_CARE_PROVIDER_SITE_OTHER): Payer: Medicare Other | Admitting: Internal Medicine

## 2016-10-24 DIAGNOSIS — E785 Hyperlipidemia, unspecified: Secondary | ICD-10-CM

## 2016-10-24 DIAGNOSIS — G609 Hereditary and idiopathic neuropathy, unspecified: Secondary | ICD-10-CM | POA: Diagnosis not present

## 2016-10-24 DIAGNOSIS — K219 Gastro-esophageal reflux disease without esophagitis: Secondary | ICD-10-CM

## 2016-10-24 DIAGNOSIS — E538 Deficiency of other specified B group vitamins: Secondary | ICD-10-CM | POA: Diagnosis not present

## 2016-10-24 DIAGNOSIS — E2839 Other primary ovarian failure: Secondary | ICD-10-CM

## 2016-10-24 DIAGNOSIS — I10 Essential (primary) hypertension: Secondary | ICD-10-CM | POA: Diagnosis not present

## 2016-10-24 MED ORDER — RANITIDINE HCL 150 MG PO TABS: 150.0000 mg | ORAL_TABLET | Freq: Every day | ORAL | 11 refills | Status: DC

## 2016-10-24 MED ORDER — OMEPRAZOLE 40 MG PO CPDR: 40.0000 mg | DELAYED_RELEASE_CAPSULE | Freq: Two times a day (BID) | ORAL | 11 refills | Status: DC

## 2016-10-24 NOTE — Assessment & Plan Note (Signed)
Gabapentin

## 2016-10-24 NOTE — Assessment & Plan Note (Signed)
On B12 

## 2016-10-24 NOTE — Assessment & Plan Note (Signed)
Omepraszole bid Zantac at hs Hold Simvastatin

## 2016-10-24 NOTE — Assessment & Plan Note (Signed)
Amlodipine, HCTZ °

## 2016-10-24 NOTE — Assessment & Plan Note (Signed)
Hold rx x 2 wks due to GERD

## 2016-10-24 NOTE — Progress Notes (Signed)
Subjective:  Patient ID: [name], female    DOB: 1925-12-09  Age: 83 y.o. MRN: [id number]  CC: Follow-up   HPI [name] presents for CLL on IVG, HTN, OA f/u  Outpatient Medications Prior to Visit  Medication Sig Dispense Refill  . acetaminophen (TYLENOL) 500 MG tablet Take 500 mg by mouth 2 (two) times daily.     Marland Kitchen amLODipine (NORVASC) 5 MG tablet TAKE 1 TABLET (5 MG TOTAL) BY MOUTH DAILY. 30 tablet 11  . Ascorbic Acid (VITAMIN C PO) Take 1 tablet by mouth daily.    Marland Kitchen aspirin 81 MG EC tablet Take 81 mg by mouth daily.      . B-D 3CC LUER-LOK SYR 25GX1" 25G X 1" 3 ML MISC USE TO ADMINISTER B12 INJECTIONS 10 each 2  . Bioflavonoid Products (GRAPE SEED PO) Take 1 tablet by mouth daily.    Marland Kitchen conjugated estrogens (PREMARIN) vaginal cream 1/2 gram vaginally every night x 1 week, then twice weekly at bedtime 90 g 3  . cromolyn (OPTICROM) 4 % ophthalmic solution 1 DROP IN BOTH EYES FOUR TIMES A DAY  12  . cyanocobalamin (,VITAMIN B-12,) 1000 MCG/ML injection ADMINISTER 1 CC SUBQ EVERY 4 WEEKS AS DIRECTED 10 mL 0  . fluticasone (FLONASE) 50 MCG/ACT nasal spray PLACE 2 SPRAYS INTO BOTH NOSTRILS DAILY. 16 g 3  . folic acid (FOLVITE) 1 MG tablet Take 1 mg by mouth daily. For restless legs    . gabapentin (NEURONTIN) 300 MG capsule Take 1 capsule (300 mg total) by mouth daily. 90 capsule 1  . GINSENG PO Take 1 tablet by mouth daily.    . hydrochlorothiazide (MICROZIDE) 12.5 MG capsule Take 1 capsule (12.5 mg total) by mouth daily. 7 capsule 0  . Insulin Syringe-Needle U-100 (B-D INS SYR ULTRAFINE 1CC/30G) 30G X 1/2" 1 ML MISC USE TO ADMINISTER B12 INJECTIONS 10 each 1  . Insulin Syringe-Needle U-100 (B-D INS SYRINGE 0.5CC/30GX1/2") 30G X 1/2" 0.5 ML MISC by Does not apply route. To administer B12 injections     . KLOR-CON M10 10 MEQ tablet TAKE 1 TABLET (10 MEQ TOTAL) BY MOUTH DAILY. 90 tablet 3  . latanoprost (XALATAN) 0.005 % ophthalmic solution Place 1 drop into both eyes at bedtime.  2.5 mL 5  . loratadine (CLARITIN) 10 MG tablet TAKE 1 TABLET (10 MG TOTAL) BY MOUTH DAILY. 90 tablet 3  . MAGNESIUM CHLORIDE PO Take 1 tablet by mouth daily.    . Multiple Vitamin (MULTI-DAY PO) Take 1 tablet by mouth daily.    Marland Kitchen nystatin (MYCOSTATIN) 100000 UNIT/ML suspension SWISH AND SPIT WITH 1 TEASPOONFUL 4 TO 5 TIMES DAILY  0  . pantoprazole (PROTONIX) 40 MG tablet Take 1 tablet (40 mg total) by mouth 2 (two) times daily. 60 tablet 11  . ranitidine (ZANTAC) 150 MG tablet TAKE 1 TABLET (150 MG TOTAL) BY MOUTH 2 (TWO) TIMES DAILY. 60 tablet 0  . simvastatin (ZOCOR) 20 MG tablet TAKE 1 TABLET (20 MG TOTAL) BY MOUTH DAILY. (Patient taking differently: TAKE 1 TABLET (10 MG TOTAL) BY MOUTH DAILY.) 90 tablet 2  . VITAMIN E PO Take 1 tablet by mouth daily.     No facility-administered medications prior to visit.     ROS Review of Systems  Constitutional: Positive for fatigue. Negative for activity change, appetite change, chills and unexpected weight change.  HENT: Negative for congestion, mouth sores and sinus pressure.   Eyes: Negative for visual disturbance.  Respiratory: Negative for  cough and chest tightness.   Gastrointestinal: Negative for abdominal pain and nausea.  Genitourinary: Negative for difficulty urinating, frequency and vaginal pain.  Musculoskeletal: Positive for arthralgias and gait problem. Negative for back pain.  Skin: Negative for pallor and rash.  Neurological: Positive for weakness. Negative for dizziness, tremors, numbness and headaches.  Psychiatric/Behavioral: Negative for confusion and sleep disturbance.    Objective:  BP 116/68   Pulse 76   Temp 98.6 F (37 C)   Ht 5' 3.5" (1.613 m)   Wt 137 lb (62.1 kg)   SpO2 98%   BMI 23.89 kg/m   BP Readings from Last 3 Encounters:  10/24/16 116/68  10/17/16 137/79  09/19/16 123/66    Wt Readings from Last 3 Encounters:  10/24/16 137 lb (62.1 kg)  09/19/16 134 lb 3.2 oz (60.9 kg)  08/29/16 134 lb (60.8  kg)    Physical Exam  Constitutional: She appears well-developed. No distress.  HENT:  Head: Normocephalic.  Right Ear: External ear normal.  Left Ear: External ear normal.  Nose: Nose normal.  Mouth/Throat: Oropharynx is clear and moist.  Eyes: Conjunctivae are normal. Pupils are equal, round, and reactive to light. Right eye exhibits no discharge. Left eye exhibits no discharge.  Neck: Normal range of motion. Neck supple. No JVD present. No tracheal deviation present. No thyromegaly present.  Cardiovascular: Normal rate, regular rhythm and normal heart sounds.   Pulmonary/Chest: No stridor. No respiratory distress. She has no wheezes.  Abdominal: Soft. Bowel sounds are normal. She exhibits no distension and no mass. There is no tenderness. There is no rebound and no guarding.  Musculoskeletal: She exhibits no edema or tenderness.  Lymphadenopathy:    She has no cervical adenopathy.  Neurological: She displays normal reflexes. No cranial nerve deficit. She exhibits normal muscle tone. Coordination abnormal.  Skin: No rash noted. No erythema.  Psychiatric: She has a normal mood and affect. Her behavior is normal. Judgment and thought content normal.  cane  Lab Results  Component Value Date   WBC 5.6 10/17/2016   HGB 13.3 10/17/2016   HCT 39.0 10/17/2016   PLT 147 10/17/2016   GLUCOSE 106 (H) 08/16/2016   CHOL 191 10/01/2014   TRIG 72.0 10/01/2014   HDL 57.80 10/01/2014   LDLCALC 119 (H) 10/01/2014   ALT 14 08/16/2016   AST 21 08/16/2016   NA 134 (L) 08/16/2016   K 3.8 08/16/2016   CL 96 08/16/2016   CREATININE 0.85 08/16/2016   BUN 15 08/16/2016   CO2 33 (H) 08/16/2016   TSH 3.38 12/03/2015   INR 1.05 05/10/2016   HGBA1C  10/07/2008    5.9 (NOTE)   The ADA recommends the following therapeutic goal for glycemic   control related to Hgb A1C measurement:   Goal of Therapy:   < 7.0% Hgb A1C   Reference: American Diabetes Association: Clinical Practice   Recommendations  2008, Diabetes Care,  2008, 31:(Suppl 1).   MICROALBUR 0.4 07/14/2009    No results found.  Assessment & Plan:   There are no diagnoses linked to this encounter. I am having Ms. Brindle maintain her Insulin Syringe-Needle U-100, aspirin, acetaminophen, folic acid, B-D 3CC LUER-LOK SYR 25GX1", amLODipine, conjugated estrogens, simvastatin, latanoprost, KLOR-CON M10, cromolyn, loratadine, nystatin, cyanocobalamin, hydrochlorothiazide, ranitidine, Ascorbic Acid (VITAMIN C PO), VITAMIN E PO, GINSENG PO, Multiple Vitamin (MULTI-DAY PO), Bioflavonoid Products (GRAPE SEED PO), MAGNESIUM CHLORIDE PO, Insulin Syringe-Needle U-100, gabapentin, fluticasone, pantoprazole, and omeprazole.  Meds ordered this encounter  Medications  .  omeprazole (PRILOSEC) 40 MG capsule    Sig: Take 40 mg by mouth daily.    Refill:  1     Follow-up: No Follow-up on file.  Walker Kehr, MD

## 2016-10-25 ENCOUNTER — Other Ambulatory Visit: Payer: Self-pay | Admitting: Family

## 2016-11-01 ENCOUNTER — Telehealth: Payer: Self-pay | Admitting: Internal Medicine

## 2016-11-01 MED ORDER — AMPICILLIN 500 MG PO CAPS: 500.0000 mg | ORAL_CAPSULE | Freq: Four times a day (QID) | ORAL | 0 refills | Status: DC

## 2016-11-01 NOTE — Telephone Encounter (Signed)
Routing to dr plotnikov, please advise, thanks 

## 2016-11-01 NOTE — Telephone Encounter (Signed)
OK - ampicillin Thx

## 2016-11-01 NOTE — Telephone Encounter (Signed)
Pt called stating she believes she has a bladder infection, Pt unrinated  on a AZO Strips and states the strip said she had an infection, would like some medication called in for this.

## 2016-11-02 NOTE — Telephone Encounter (Signed)
Patient advised that rx has been sent to pharm 

## 2016-11-13 ENCOUNTER — Other Ambulatory Visit: Payer: Self-pay | Admitting: *Deleted

## 2016-11-14 ENCOUNTER — Ambulatory Visit: Payer: Medicare Other | Admitting: Nurse Practitioner

## 2016-11-14 ENCOUNTER — Other Ambulatory Visit: Payer: Medicare Other

## 2016-11-14 ENCOUNTER — Ambulatory Visit: Payer: Medicare Other

## 2016-11-14 ENCOUNTER — Ambulatory Visit (HOSPITAL_BASED_OUTPATIENT_CLINIC_OR_DEPARTMENT_OTHER): Payer: Medicare Other

## 2016-11-14 ENCOUNTER — Ambulatory Visit (HOSPITAL_BASED_OUTPATIENT_CLINIC_OR_DEPARTMENT_OTHER): Payer: Medicare Other | Admitting: Nurse Practitioner

## 2016-11-14 VITALS — BP 123/70 | HR 63 | Temp 98.4°F | Resp 16

## 2016-11-14 VITALS — BP 130/69 | HR 87 | Temp 98.2°F | Resp 16 | Ht 63.0 in | Wt 137.2 lb

## 2016-11-14 DIAGNOSIS — C9110 Chronic lymphocytic leukemia of B-cell type not having achieved remission: Secondary | ICD-10-CM

## 2016-11-14 DIAGNOSIS — J984 Other disorders of lung: Secondary | ICD-10-CM | POA: Diagnosis not present

## 2016-11-14 DIAGNOSIS — D801 Nonfamilial hypogammaglobulinemia: Secondary | ICD-10-CM | POA: Diagnosis not present

## 2016-11-14 MED ORDER — DIPHENHYDRAMINE HCL 25 MG PO CAPS
ORAL_CAPSULE | ORAL | Status: AC
  Filled 2016-11-14: qty 1

## 2016-11-14 MED ORDER — DIPHENHYDRAMINE HCL 25 MG PO CAPS
25.0000 mg | ORAL_CAPSULE | Freq: Once | ORAL | Status: AC
  Administered 2016-11-14: 25 mg via ORAL

## 2016-11-14 MED ORDER — IMMUNE GLOBULIN (HUMAN) 10 GM/100ML IV SOLN
30.0000 g | Freq: Once | INTRAVENOUS | Status: AC
  Administered 2016-11-14: 30 g via INTRAVENOUS
  Filled 2016-11-14: qty 200

## 2016-11-14 NOTE — Progress Notes (Signed)
  [place] OFFICE PROGRESS NOTE   Diagnosis:  CLL  INTERVAL HISTORY:   [name] returns as scheduled. She continues monthly IVIG. She reports a recent urinary tract infection. She completed a course of antibiotics. No other infections. No fevers or sweats. No palpable lymph nodes. She reports a good appetite. Her weight is stable.  Objective:  Vital signs in last 24 hours:  Blood pressure 130/69, pulse 87, temperature 98.2 F (36.8 C), temperature source Oral, resp. rate 16, height 5\' 3"  (1.6 m), weight 137 lb 3.2 oz (62.2 kg), SpO2 95 %.    HEENT: No thrush or ulcers. Lymphatics: No palpable cervical, supraclavicular or axillary lymph nodes. Resp: Lungs clear bilaterally. Cardio: Regular rate and rhythm. GI: Abdomen soft and nontender. No organomegaly. Vascular: No leg edema.   Lab Results:  Lab Results  Component Value Date   WBC 5.6 10/17/2016   HGB 13.3 10/17/2016   HCT 39.0 10/17/2016   MCV 93.9 10/17/2016   PLT 147 10/17/2016   NEUTROABS 3.6 10/17/2016    Imaging:  No results found.  Medications: I have reviewed the patient's current medications.  Assessment/Plan: 1. Chronic lymphocytic leukemia, asymptomatic. 2. History of hypogammaglobulinemia with recurrent infections. She continues monthly IVIG replacement therapy. 3. History of recurrent urinary tract infections.Coagulase-negative Staphylococcus infection August 2016 4. History of mild thrombocytopenia secondary to CLL. 5. Question right upper lobe nodule on chest x-ray 05/09/2016; follow-up chest x-ray 09/19/2016 with more confluent irregular nodular density right upper lobe measuring 3 x 1.8 cm. Plan for repeat chest x-ray at a three-month interval.   Disposition: [name] appears unchanged. Plan to continue monthly IVIG.  Follow-up chest x-ray in February showed a right upper lobe nodular density. She will have a repeat chest x-ray when she returns next month for IVIG.  We  will see her in follow-up in 2 months. She will contact the office in the interim with any problems.  Plan reviewed with Dr. Benay Spice.    Ned Card ANP/GNP-BC   11/14/2016  10:26 AM

## 2016-11-14 NOTE — Patient Instructions (Signed)

## 2016-11-15 ENCOUNTER — Other Ambulatory Visit: Payer: Self-pay | Admitting: *Deleted

## 2016-11-15 MED ORDER — POTASSIUM CHLORIDE CRYS ER 10 MEQ PO TBCR: 10.0000 meq | EXTENDED_RELEASE_TABLET | Freq: Every day | ORAL | 1 refills | Status: DC

## 2016-11-17 ENCOUNTER — Telehealth: Payer: Self-pay | Admitting: Oncology

## 2016-11-17 NOTE — Telephone Encounter (Signed)
Follow up with Dr Benay Spice added to 01/09/17 Infusion appointment, per 11/14/16 los.

## 2016-11-21 ENCOUNTER — Other Ambulatory Visit: Payer: Self-pay | Admitting: *Deleted

## 2016-11-21 DIAGNOSIS — R0981 Nasal congestion: Secondary | ICD-10-CM

## 2016-11-21 MED ORDER — FLUTICASONE PROPIONATE 50 MCG/ACT NA SUSP: 2.0000 | Freq: Every day | NASAL | 0 refills | Status: AC

## 2016-11-23 ENCOUNTER — Encounter: Payer: Self-pay | Admitting: Internal Medicine

## 2016-11-23 ENCOUNTER — Ambulatory Visit (INDEPENDENT_AMBULATORY_CARE_PROVIDER_SITE_OTHER): Payer: Medicare Other | Admitting: Internal Medicine

## 2016-11-23 ENCOUNTER — Other Ambulatory Visit (INDEPENDENT_AMBULATORY_CARE_PROVIDER_SITE_OTHER): Payer: Medicare Other

## 2016-11-23 VITALS — BP 128/80 | HR 98 | Temp 98.2°F | Ht 63.0 in | Wt 136.0 lb

## 2016-11-23 DIAGNOSIS — N390 Urinary tract infection, site not specified: Secondary | ICD-10-CM

## 2016-11-23 DIAGNOSIS — I10 Essential (primary) hypertension: Secondary | ICD-10-CM

## 2016-11-23 DIAGNOSIS — E538 Deficiency of other specified B group vitamins: Secondary | ICD-10-CM | POA: Diagnosis not present

## 2016-11-23 DIAGNOSIS — M810 Age-related osteoporosis without current pathological fracture: Secondary | ICD-10-CM | POA: Diagnosis not present

## 2016-11-23 DIAGNOSIS — E559 Vitamin D deficiency, unspecified: Secondary | ICD-10-CM

## 2016-11-23 LAB — HEPATIC FUNCTION PANEL
ALK PHOS: 50 U/L (ref 39–117)
ALT: 15 U/L (ref 0–35)
AST: 21 U/L (ref 0–37)
Albumin: 4.2 g/dL (ref 3.5–5.2)
BILIRUBIN TOTAL: 0.4 mg/dL (ref 0.2–1.2)
Bilirubin, Direct: 0.1 mg/dL (ref 0.0–0.3)
Total Protein: 8 g/dL (ref 6.0–8.3)

## 2016-11-23 LAB — BASIC METABOLIC PANEL
BUN: 25 mg/dL — AB (ref 6–23)
CO2: 31 meq/L (ref 19–32)
CREATININE: 0.88 mg/dL (ref 0.40–1.20)
Calcium: 10 mg/dL (ref 8.4–10.5)
Chloride: 97 mEq/L (ref 96–112)
GFR: 64.4 mL/min (ref >60.00)
GLUCOSE: 83 mg/dL (ref 70–99)
Potassium: 4.3 mEq/L (ref 3.5–5.1)
Sodium: 134 mEq/L — ABNORMAL LOW (ref 135–145)

## 2016-11-23 LAB — VITAMIN D 25 HYDROXY (VIT D DEFICIENCY, FRACTURES): VITD: 46.68 ng/mL (ref 30.00–100.00)

## 2016-11-23 MED ORDER — VITAMIN D3 50 MCG (2000 UT) PO CAPS: 2000.0000 [IU] | ORAL_CAPSULE | Freq: Every day | ORAL | 3 refills | Status: AC

## 2016-11-23 MED ORDER — CIPROFLOXACIN HCL 250 MG PO TABS: 250.0000 mg | ORAL_TABLET | Freq: Two times a day (BID) | ORAL | 0 refills | Status: DC

## 2016-11-23 NOTE — Assessment & Plan Note (Signed)
Options discussed Labs Prolia

## 2016-11-23 NOTE — Assessment & Plan Note (Signed)
Amlodipine, HCTZ °

## 2016-11-23 NOTE — Progress Notes (Signed)
Subjective:  Patient ID: [name], female    DOB: 16-Jul-1926  Age: 83 y.o. MRN: [id number]  CC: No chief complaint on file.   HPI [name] presents for HTN, dyslipidemia, CFS f/u C/o UTI - had a (+) OTC urine test. Cx w/E coli in 11/17 F/u abn BDS  Outpatient Medications Prior to Visit  Medication Sig Dispense Refill  . acetaminophen (TYLENOL) 500 MG tablet Take 500 mg by mouth 2 (two) times daily.     Marland Kitchen amLODipine (NORVASC) 5 MG tablet TAKE 1 TABLET (5 MG TOTAL) BY MOUTH DAILY. 30 tablet 11  . ampicillin (PRINCIPEN) 500 MG capsule Take 1 capsule (500 mg total) by mouth 4 (four) times daily. 16 capsule 0  . Ascorbic Acid (VITAMIN C PO) Take 1 tablet by mouth daily.    Marland Kitchen aspirin 81 MG EC tablet Take 81 mg by mouth daily.      . B-D 3CC LUER-LOK SYR 25GX1" 25G X 1" 3 ML MISC USE TO ADMINISTER B12 INJECTIONS 10 each 2  . Bioflavonoid Products (GRAPE SEED PO) Take 1 tablet by mouth daily.    Marland Kitchen conjugated estrogens (PREMARIN) vaginal cream 1/2 gram vaginally every night x 1 week, then twice weekly at bedtime 90 g 3  . cromolyn (OPTICROM) 4 % ophthalmic solution 1 DROP IN BOTH EYES FOUR TIMES A DAY  12  . cyanocobalamin (,VITAMIN B-12,) 1000 MCG/ML injection ADMINISTER 1 CC SUBQ EVERY 4 WEEKS AS DIRECTED 10 mL 0  . fluticasone (FLONASE) 50 MCG/ACT nasal spray Place 2 sprays into both nostrils daily. 48 g 0  . folic acid (FOLVITE) 1 MG tablet Take 1 mg by mouth daily. For restless legs    . gabapentin (NEURONTIN) 300 MG capsule Take 1 capsule (300 mg total) by mouth daily. 90 capsule 1  . GINSENG PO Take 1 tablet by mouth daily.    . hydrochlorothiazide (MICROZIDE) 12.5 MG capsule Take 1 capsule (12.5 mg total) by mouth daily. 7 capsule 0  . Insulin Syringe-Needle U-100 (B-D INS SYR ULTRAFINE 1CC/30G) 30G X 1/2" 1 ML MISC USE TO ADMINISTER B12 INJECTIONS 10 each 1  . Insulin Syringe-Needle U-100 (B-D INS SYRINGE 0.5CC/30GX1/2") 30G X 1/2" 0.5 ML MISC by Does not apply route. To  administer B12 injections     . latanoprost (XALATAN) 0.005 % ophthalmic solution Place 1 drop into both eyes at bedtime. 2.5 mL 5  . loratadine (CLARITIN) 10 MG tablet TAKE 1 TABLET (10 MG TOTAL) BY MOUTH DAILY. 90 tablet 3  . MAGNESIUM CHLORIDE PO Take 1 tablet by mouth daily.    . Multiple Vitamin (MULTI-DAY PO) Take 1 tablet by mouth daily.    Marland Kitchen omeprazole (PRILOSEC) 40 MG capsule Take 1 capsule (40 mg total) by mouth 2 (two) times daily. 60 capsule 11  . potassium chloride (KLOR-CON M10) 10 MEQ tablet Take 1 tablet (10 mEq total) by mouth daily. 90 tablet 1  . ranitidine (ZANTAC) 150 MG tablet TAKE 1 TABLET (150 MG TOTAL) BY MOUTH 2 (TWO) TIMES DAILY. 60 tablet 5  . simvastatin (ZOCOR) 20 MG tablet TAKE 1 TABLET (20 MG TOTAL) BY MOUTH DAILY. (Patient taking differently: TAKE 1 TABLET (10 MG TOTAL) BY MOUTH DAILY.) 90 tablet 2   No facility-administered medications prior to visit.     ROS Review of Systems  Constitutional: Negative for activity change, appetite change, chills, fatigue and unexpected weight change.  HENT: Negative for congestion, mouth sores and sinus pressure.   Eyes:  Negative for visual disturbance.  Respiratory: Negative for cough and chest tightness.   Gastrointestinal: Negative for abdominal pain and nausea.  Genitourinary: Positive for frequency and urgency. Negative for difficulty urinating and vaginal pain.  Musculoskeletal: Negative for back pain and gait problem.  Skin: Negative for pallor and rash.  Neurological: Negative for dizziness, tremors, weakness, numbness and headaches.  Psychiatric/Behavioral: Positive for self-injury. Negative for confusion and sleep disturbance.    Objective:  BP 128/80 (BP Location: Left Arm, Patient Position: Sitting, Cuff Size: Normal)   Pulse 98   Temp 98.2 F (36.8 C) (Oral)   Ht 5\' 3"  (1.6 m)   Wt 136 lb (61.7 kg)   SpO2 98%   BMI 24.09 kg/m   BP Readings from Last 3 Encounters:  11/23/16 128/80  11/14/16 123/70   11/14/16 130/69    Wt Readings from Last 3 Encounters:  11/23/16 136 lb (61.7 kg)  11/14/16 137 lb 3.2 oz (62.2 kg)  10/24/16 137 lb (62.1 kg)    Physical Exam  Constitutional: She appears well-developed. No distress.  HENT:  Head: Normocephalic.  Right Ear: External ear normal.  Left Ear: External ear normal.  Nose: Nose normal.  Mouth/Throat: Oropharynx is clear and moist.  Eyes: Conjunctivae are normal. Pupils are equal, round, and reactive to light. Right eye exhibits no discharge. Left eye exhibits no discharge.  Neck: Normal range of motion. Neck supple. No JVD present. No tracheal deviation present. No thyromegaly present.  Cardiovascular: Normal rate, regular rhythm and normal heart sounds.   Pulmonary/Chest: No stridor. No respiratory distress. She has no wheezes.  Abdominal: Soft. Bowel sounds are normal. She exhibits no distension and no mass. There is no tenderness. There is no rebound and no guarding.  Musculoskeletal: She exhibits no edema or tenderness.  Lymphadenopathy:    She has no cervical adenopathy.  Neurological: She displays normal reflexes. No cranial nerve deficit. She exhibits normal muscle tone. Coordination abnormal.  Skin: No rash noted. No erythema.  Psychiatric: She has a normal mood and affect. Her behavior is normal. Judgment and thought content normal.  walker  Lab Results  Component Value Date   WBC 5.6 10/17/2016   HGB 13.3 10/17/2016   HCT 39.0 10/17/2016   PLT 147 10/17/2016   GLUCOSE 106 (H) 08/16/2016   CHOL 191 10/01/2014   TRIG 72.0 10/01/2014   HDL 57.80 10/01/2014   LDLCALC 119 (H) 10/01/2014   ALT 14 08/16/2016   AST 21 08/16/2016   NA 134 (L) 08/16/2016   K 3.8 08/16/2016   CL 96 08/16/2016   CREATININE 0.85 08/16/2016   BUN 15 08/16/2016   CO2 33 (H) 08/16/2016   TSH 3.38 12/03/2015   INR 1.05 05/10/2016   HGBA1C  10/07/2008    5.9 (NOTE)   The ADA recommends the following therapeutic goal for glycemic   control  related to Hgb A1C measurement:   Goal of Therapy:   < 7.0% Hgb A1C   Reference: American Diabetes Association: Clinical Practice   Recommendations 2008, Diabetes Care,  2008, 31:(Suppl 1).   MICROALBUR 0.4 07/14/2009    Dexascan  Result Date: 10/29/2016 Date of study: 10/24/16 Exam: DUAL X-RAY ABSORPTIOMETRY (DXA) FOR BONE MINERAL DENSITY (BMD) Instrument: [employer] Requesting Provider: PCP Indication: follow up for low BMD Comparison: none (please note that it is not possible to compare data from different instruments) Clinical data: Pt is a 83 y.o. female with previous history of fracture. On calcium and vitamin D. Results:  Lumbar spine (L1-L4) Femoral neck (FN) 33% distal radius T-score -1.3 RFN:-2.6 LFN:-2.4 n/a Change in BMD from previous DXA test (%) n/a n/a n/a (*) statistically significant Assessment: the BMD is low according to the [facility] classification for osteoporosis (see below). Fracture risk: moderate FRAX score: not calculated due to osteoporosis Comments: the technical quality of the study is good.  Scoliosis and degenerative changes in the spine may falsely elevate score. Evaluation for secondary causes should be considered if clinically indicated. Recommend optimizing calcium (1200 mg/day) and vitamin D (800 IU/day) intake. Followup: Repeat BMD is appropriate after 2 years or after 1-2 years if starting treatment. WHO criteria for diagnosis of osteoporosis in postmenopausal women and in men 9 y/o or older: - normal: T-score -1.0 to + 1.0 - osteopenia/low bone density: T-score between -2.5 and -1.0 - osteoporosis: T-score below -2.5 - severe osteoporosis: T-score below -2.5 with history of fragility fracture Note: although not part of the WHO classification, the presence of a fragility fracture, regardless of the T-score, should be considered diagnostic of osteoporosis, provided other causes for the fracture have been excluded. Treatment: The National Osteoporosis Foundation recommends  that treatment be considered in postmenopausal women and men age 57 or older with: 1. Hip or vertebral (clinical or morphometric) fracture 2. T-score of - 2.5 or lower at the spine or hip 3. 10-year fracture probability by FRAX of at least 20% for a major osteoporotic fracture and 3% for a hip fracture Loura Pardon MD    Assessment & Plan:   There are no diagnoses linked to this encounter. I am having Ms. Orcutt maintain her Insulin Syringe-Needle U-100, aspirin, acetaminophen, folic acid, B-D 3CC LUER-LOK SYR 25GX1", amLODipine, conjugated estrogens, simvastatin, latanoprost, cromolyn, loratadine, cyanocobalamin, hydrochlorothiazide, Ascorbic Acid (VITAMIN C PO), GINSENG PO, Multiple Vitamin (MULTI-DAY PO), Bioflavonoid Products (GRAPE SEED PO), MAGNESIUM CHLORIDE PO, Insulin Syringe-Needle U-100, gabapentin, omeprazole, ranitidine, ampicillin, potassium chloride, and fluticasone.  No orders of the defined types were placed in this encounter.    Follow-up: No Follow-up on file.  Walker Kehr, MD

## 2016-11-23 NOTE — Progress Notes (Signed)
Pre visit review using our clinic review tool, if applicable. No additional management support is needed unless otherwise documented below in the visit note. 

## 2016-11-23 NOTE — Assessment & Plan Note (Signed)
On B12 

## 2016-11-23 NOTE — Assessment & Plan Note (Signed)
Start Cipro

## 2016-11-23 NOTE — Patient Instructions (Signed)
Shingrix vaccine

## 2016-11-28 ENCOUNTER — Ambulatory Visit: Payer: Medicare Other | Admitting: Internal Medicine

## 2016-12-06 ENCOUNTER — Encounter: Payer: Self-pay | Admitting: Gynecology

## 2016-12-06 ENCOUNTER — Telehealth: Payer: Self-pay | Admitting: *Deleted

## 2016-12-06 MED ORDER — RANITIDINE HCL 150 MG PO TABS: 150.0000 mg | ORAL_TABLET | Freq: Every day | ORAL | 11 refills | Status: DC

## 2016-12-06 MED ORDER — OMEPRAZOLE 40 MG PO CPDR: 40.0000 mg | DELAYED_RELEASE_CAPSULE | Freq: Two times a day (BID) | ORAL | 11 refills | Status: DC

## 2016-12-06 NOTE — Telephone Encounter (Signed)
Rec'd call pt states pharmacy told her she did not have any more refills on the omeprazole and she did not need to take anymore, and she is wanting to know why. Inform pt per chart MD ok her omeprazole for a year. He did not state d/c. Inform pt will resend script for the omeprazole & ranitidine to [facility].../lmb

## 2016-12-12 ENCOUNTER — Ambulatory Visit (HOSPITAL_BASED_OUTPATIENT_CLINIC_OR_DEPARTMENT_OTHER): Payer: Medicare Other

## 2016-12-12 VITALS — BP 116/75 | HR 63 | Temp 97.8°F | Resp 16

## 2016-12-12 DIAGNOSIS — D801 Nonfamilial hypogammaglobulinemia: Secondary | ICD-10-CM | POA: Diagnosis not present

## 2016-12-12 DIAGNOSIS — C9110 Chronic lymphocytic leukemia of B-cell type not having achieved remission: Secondary | ICD-10-CM

## 2016-12-12 MED ORDER — IMMUNE GLOBULIN (HUMAN) 10 GM/100ML IV SOLN
30.0000 g | Freq: Once | INTRAVENOUS | Status: AC
  Administered 2016-12-12: 30 g via INTRAVENOUS
  Filled 2016-12-12: qty 200

## 2016-12-12 MED ORDER — DIPHENHYDRAMINE HCL 25 MG PO CAPS
25.0000 mg | ORAL_CAPSULE | Freq: Once | ORAL | Status: AC
  Administered 2016-12-12: 25 mg via ORAL

## 2016-12-12 MED ORDER — DIPHENHYDRAMINE HCL 25 MG PO CAPS
ORAL_CAPSULE | ORAL | Status: AC
  Filled 2016-12-12: qty 1

## 2016-12-12 NOTE — Progress Notes (Signed)
Patient declined to stay for 30-minutes post-observation period

## 2016-12-12 NOTE — Patient Instructions (Signed)

## 2016-12-13 ENCOUNTER — Telehealth: Payer: Self-pay | Admitting: *Deleted

## 2016-12-13 NOTE — Telephone Encounter (Signed)
Message received from patient requesting a later appt time on 01/09/17 d/t transportation issues.  Message sent to scheduling and call placed back to patient to inform her that her message was received.

## 2016-12-25 ENCOUNTER — Telehealth: Payer: Self-pay

## 2016-12-25 NOTE — Telephone Encounter (Signed)
Pt had called on 5/23 to change her schedule on 6/19 from 830 with Dr Benay Spice to 930, and change the infusion accordingly. This has not been done yet. Another inbasket sent to schedulers.

## 2016-12-26 NOTE — Telephone Encounter (Signed)
Moved office visit to 0900. Pt is concerned that her ride may not get her here on time. Reassured her we will work her in if necessary.

## 2016-12-29 ENCOUNTER — Telehealth: Payer: Self-pay

## 2016-12-29 ENCOUNTER — Telehealth: Payer: Self-pay | Admitting: *Deleted

## 2016-12-29 NOTE — Telephone Encounter (Signed)
Message received from patient concerned that scheduling called her son for appts and not herself. She was worried that her phone was not working properly. Patient states that she will be here as scheduled on 01/09/17 and is appreciative of return call.

## 2016-12-29 NOTE — Telephone Encounter (Signed)
Called and left a message with a new appt due to 5/23 inbox message.  I called the patients home and cell number which neither had a voicemail.

## 2017-01-02 ENCOUNTER — Telehealth: Payer: Self-pay | Admitting: Oncology

## 2017-01-02 NOTE — Telephone Encounter (Signed)
sw pt to confirm 6/19 appt time change to 0930 per sch msg

## 2017-01-03 ENCOUNTER — Telehealth: Payer: Self-pay

## 2017-01-03 NOTE — Telephone Encounter (Signed)
Patient should start prolia injections per dr plotnikov----I have verified insurance with summary of benefits stating estimated $85 copay due for each injection---patient has been advised and has made nurse visit for 6/26

## 2017-01-04 ENCOUNTER — Other Ambulatory Visit: Payer: Self-pay | Admitting: Internal Medicine

## 2017-01-04 ENCOUNTER — Telehealth: Payer: Self-pay | Admitting: Internal Medicine

## 2017-01-04 DIAGNOSIS — R531 Weakness: Secondary | ICD-10-CM

## 2017-01-04 DIAGNOSIS — R269 Unspecified abnormalities of gait and mobility: Secondary | ICD-10-CM

## 2017-01-04 NOTE — Telephone Encounter (Signed)
please advise if patient is supposed to be taking

## 2017-01-04 NOTE — Telephone Encounter (Signed)
Pt is requesting an order for a new walker. The first available appointment that we had to fit her scheduled is on July 31st. She wanted me to check to see if an order could be put in for her without having to come in.

## 2017-01-04 NOTE — Telephone Encounter (Signed)
Spoke with patient, she was seen 05/03 and we should be able to use her OV note from then. Walker ordered, waiting for signature then will fax to [facility].

## 2017-01-09 ENCOUNTER — Ambulatory Visit (HOSPITAL_COMMUNITY)
Admission: RE | Admit: 2017-01-09 | Discharge: 2017-01-09 | Disposition: A | Discharge status: Home / Self Care | Payer: Medicare Other | Source: Ambulatory Visit | Attending: Nurse Practitioner | Admitting: Nurse Practitioner

## 2017-01-09 ENCOUNTER — Ambulatory Visit (HOSPITAL_BASED_OUTPATIENT_CLINIC_OR_DEPARTMENT_OTHER): Payer: Medicare Other

## 2017-01-09 ENCOUNTER — Ambulatory Visit: Payer: Medicare Other | Admitting: Oncology

## 2017-01-09 ENCOUNTER — Ambulatory Visit (HOSPITAL_BASED_OUTPATIENT_CLINIC_OR_DEPARTMENT_OTHER): Payer: Medicare Other | Admitting: Oncology

## 2017-01-09 ENCOUNTER — Ambulatory Visit (INDEPENDENT_AMBULATORY_CARE_PROVIDER_SITE_OTHER): Payer: Medicare Other | Admitting: General Practice

## 2017-01-09 ENCOUNTER — Ambulatory Visit: Payer: Medicare Other

## 2017-01-09 ENCOUNTER — Telehealth: Payer: Self-pay | Admitting: Oncology

## 2017-01-09 VITALS — BP 112/67 | HR 47 | Temp 98.1°F | Resp 17

## 2017-01-09 VITALS — BP 125/73 | HR 55 | Temp 96.9°F | Resp 17 | Ht 63.0 in | Wt 138.0 lb

## 2017-01-09 DIAGNOSIS — C9110 Chronic lymphocytic leukemia of B-cell type not having achieved remission: Secondary | ICD-10-CM

## 2017-01-09 DIAGNOSIS — D801 Nonfamilial hypogammaglobulinemia: Secondary | ICD-10-CM | POA: Diagnosis not present

## 2017-01-09 DIAGNOSIS — C9190 Lymphoid leukemia, unspecified not having achieved remission: Secondary | ICD-10-CM | POA: Diagnosis not present

## 2017-01-09 DIAGNOSIS — R918 Other nonspecific abnormal finding of lung field: Secondary | ICD-10-CM | POA: Diagnosis not present

## 2017-01-09 DIAGNOSIS — M810 Age-related osteoporosis without current pathological fracture: Secondary | ICD-10-CM | POA: Diagnosis not present

## 2017-01-09 MED ORDER — DIPHENHYDRAMINE HCL 25 MG PO CAPS
25.0000 mg | ORAL_CAPSULE | Freq: Once | ORAL | Status: AC
  Administered 2017-01-09: 25 mg via ORAL

## 2017-01-09 MED ORDER — DENOSUMAB 60 MG/ML ~~LOC~~ SOLN
60.0000 mg | Freq: Once | SUBCUTANEOUS | Status: AC
  Administered 2017-01-09: 60 mg via SUBCUTANEOUS

## 2017-01-09 MED ORDER — IMMUNE GLOBULIN (HUMAN) 10 GM/100ML IV SOLN
30.0000 g | Freq: Once | INTRAVENOUS | Status: AC
  Administered 2017-01-09: 30 g via INTRAVENOUS
  Filled 2017-01-09: qty 200

## 2017-01-09 MED ORDER — DIPHENHYDRAMINE HCL 25 MG PO CAPS
ORAL_CAPSULE | ORAL | Status: AC
  Filled 2017-01-09: qty 1

## 2017-01-09 NOTE — Patient Instructions (Signed)

## 2017-01-09 NOTE — Progress Notes (Signed)
  [place] OFFICE PROGRESS NOTE   Diagnosis: CLL  INTERVAL HISTORY:   Ms. Capri returns as scheduled. She continues monthly IVIG. No recent infection. No fever. Good appetite. She reports discomfort related to a mole when she lies on her back. Objective:  Vital signs in last 24 hours:  Blood pressure 125/73, pulse (!) 55, temperature (!) 96.9 F (36.1 C), temperature source Oral, resp. rate 17, height 5\' 3"  (1.6 m), weight 138 lb (62.6 kg), SpO2 97 %.    HEENT: Neck without mass Lymphatics: No cervical, supraclavicular, or axillary nodes Resp: Lungs clear bilaterally, distant breath sounds, no respiratory distress Cardio: Regular rate and rhythm GI: No hepatosplenomegaly Vascular: No leg edema  Skin: Benign appearing moles over the back   Portacath/PICC-without erythema  Lab Results:  Lab Results  Component Value Date   WBC 5.6 10/17/2016   HGB 13.3 10/17/2016   HCT 39.0 10/17/2016   MCV 93.9 10/17/2016   PLT 147 10/17/2016   NEUTROABS 3.6 10/17/2016      Medications: I have reviewed the patient's current medications.  Assessment/Plan: 1. Chronic lymphocytic leukemia, asymptomatic. 2. History of hypogammaglobulinemia with recurrent infections. She continues monthly IVIG replacement therapy. 3. History of recurrent urinary tract infections.Coagulase-negative Staphylococcus infection August 2016 4. History of mild thrombocytopenia secondary to CLL. 5. Question right upper lobe nodule on chest x-ray 05/09/2016; follow-up chest x-ray 09/19/2016 with more confluent irregular nodular density right upper lobe measuring 3 x 1.8 cm. Plan for repeat chest x-ray at a three-month interval.   Disposition:  She appears unchanged. She will continue monthly IVIG. Ms. Schreiter will return for an office visit in 3 months. We will refer her for a chest x-ray today to follow-up on the right upper lobe lesion noted on the chest x-ray in February.  15 minutes were  spent with the patient today. The majority of the time was used for counseling and coordination of care.  Donneta Romberg, MD  01/09/2017  9:47 AM

## 2017-01-09 NOTE — Telephone Encounter (Signed)
Appts already scheduled per 6/19 los - For 7/17 and 8/14 dates. Scheduled appt for 9/11 - Gave patient AVS and calender per LOS.

## 2017-01-10 ENCOUNTER — Telehealth: Payer: Self-pay | Admitting: *Deleted

## 2017-01-10 NOTE — Telephone Encounter (Signed)
Patient notified per order of Dr. Benay Spice that xray shows improvement in right lung nodule, likely benign, and to f/u as scheduled.  Patient appreciative of call and has no questions at this time.

## 2017-01-10 NOTE — Telephone Encounter (Signed)
-----   Message from Ladell Pier, MD sent at 01/09/2017  9:29 PM EDT ----- Please call patient, xray shows improvement in rt. Lung nodule, likely benign, f/u as scheduled

## 2017-01-16 ENCOUNTER — Ambulatory Visit: Payer: Medicare Other

## 2017-02-02 ENCOUNTER — Other Ambulatory Visit: Payer: Self-pay | Admitting: Internal Medicine

## 2017-02-02 NOTE — Telephone Encounter (Signed)
Pt states she did an in home urine test and she has a bladder infection, she would like an antibiotic called in.  [facility] [address]

## 2017-02-04 NOTE — Telephone Encounter (Signed)
Ok Keflex 500 mg po bid x 5 d Thx

## 2017-02-05 MED ORDER — CEPHALEXIN 500 MG PO CAPS: 500.0000 mg | ORAL_CAPSULE | Freq: Two times a day (BID) | ORAL | 0 refills | Status: DC

## 2017-02-05 NOTE — Telephone Encounter (Signed)
Notified pt rx has been sent to [facility].../lmb 

## 2017-02-05 NOTE — Telephone Encounter (Signed)
Patient is calling about this. Inez Catalina could you please send this to [facility] on [address]. Thank you.

## 2017-02-06 ENCOUNTER — Ambulatory Visit (HOSPITAL_BASED_OUTPATIENT_CLINIC_OR_DEPARTMENT_OTHER): Payer: Medicare Other

## 2017-02-06 VITALS — BP 114/63 | HR 57 | Temp 98.1°F | Resp 16

## 2017-02-06 DIAGNOSIS — C9110 Chronic lymphocytic leukemia of B-cell type not having achieved remission: Secondary | ICD-10-CM | POA: Diagnosis not present

## 2017-02-06 MED ORDER — IMMUNE GLOBULIN (HUMAN) 10 GM/100ML IV SOLN
30.0000 g | Freq: Once | INTRAVENOUS | Status: AC
  Administered 2017-02-06: 30 g via INTRAVENOUS
  Filled 2017-02-06: qty 200

## 2017-02-06 MED ORDER — DIPHENHYDRAMINE HCL 25 MG PO CAPS
25.0000 mg | ORAL_CAPSULE | Freq: Once | ORAL | Status: AC
  Administered 2017-02-06: 25 mg via ORAL

## 2017-02-06 MED ORDER — DIPHENHYDRAMINE HCL 25 MG PO CAPS
ORAL_CAPSULE | ORAL | Status: AC
  Filled 2017-02-06: qty 1

## 2017-02-06 NOTE — Progress Notes (Signed)
Patient presents to treatment room with c/o "bladder infection." States she is currently on abx for same. Being cared for by PMD. Also c/o "thrush in my mouth." States she will contact PMD regarding same. No other complaints.

## 2017-02-06 NOTE — Patient Instructions (Signed)

## 2017-02-09 ENCOUNTER — Telehealth: Payer: Self-pay | Admitting: Internal Medicine

## 2017-02-09 MED ORDER — CEPHALEXIN 500 MG PO CAPS: 500.0000 mg | ORAL_CAPSULE | Freq: Two times a day (BID) | ORAL | 0 refills | Status: DC

## 2017-02-09 NOTE — Telephone Encounter (Signed)
Pt called stating that she is taking Cephalexine for a bladder infection. After doing a test strip at home today she still has the infection. She wanted to know what she should do. Please advise.

## 2017-02-09 NOTE — Telephone Encounter (Signed)
Notified pt MD sent another round of [facility]exin to [facility] to take...[name]

## 2017-02-09 NOTE — Telephone Encounter (Signed)
Ok Thx 

## 2017-02-10 ENCOUNTER — Encounter: Payer: Self-pay | Admitting: Family Medicine

## 2017-02-10 ENCOUNTER — Ambulatory Visit (INDEPENDENT_AMBULATORY_CARE_PROVIDER_SITE_OTHER): Payer: Medicare Other | Admitting: Family Medicine

## 2017-02-10 VITALS — BP 128/80 | HR 91 | Temp 98.0°F | Wt 135.0 lb

## 2017-02-10 DIAGNOSIS — R350 Frequency of micturition: Secondary | ICD-10-CM | POA: Diagnosis not present

## 2017-02-10 DIAGNOSIS — B370 Candidal stomatitis: Secondary | ICD-10-CM | POA: Diagnosis not present

## 2017-02-10 MED ORDER — NYSTATIN 100000 UNIT/ML MT SUSP: 5.0000 mL | Freq: Three times a day (TID) | OROMUCOSAL | 0 refills | Status: DC

## 2017-02-10 NOTE — Patient Instructions (Signed)
Keep drinking lots of fluids Stop the keflex  If urinary symptoms continue - then call the office on Monday to get a urine culture done   Take the nystatin mouthwash for thrush- 1 teaspoon three times daily swish and swallow  Update if not starting to improve in a week or if worsening

## 2017-02-10 NOTE — Progress Notes (Signed)
Subjective:    Patient ID: [name], female    DOB: Nov 28, 1925, 83 y.o.   MRN: [id number]  HPI Here for burning/ pain in mouth   She had a bladder infection and took keflex last week  Started to get a bad burning on mouth and tongue with a bad taste  Still on the keflex (renewed it) Had to get up to rinse mouth out with water   She has hx of CLL Currently in tx   Has had this before  Magic mouthwash with lidocaine in the past  Tried it last night and it did not help   Urine symptoms continue/ frequent/urgent/no blood Hx of e coli utis in the past   Urine is clear today with nl SG  Patient Active Problem List   Diagnosis Date Noted  . Thrush 02/10/2017  . Frequent urination 02/10/2017  . Osteoporosis 11/23/2016  . Abdominal pain 08/16/2016  . Urinary frequency 08/16/2016  . Peripheral edema 08/16/2016  . General weakness 08/16/2016  . Gait disorder 08/16/2016  . Dysuria 08/03/2016  . Syncope and collapse 05/10/2016  . Closed displaced simple supracondylar fracture of right humerus without intercondylar fracture 05/10/2016  . Urinary tract infection without hematuria 05/10/2016  . Syncope 05/10/2016  . Humeral head fracture, right, closed, initial encounter   . Burning tongue 03/25/2016  . Post concussion syndrome 03/03/2016  . Fall 03/03/2016  . Cystocele 03/05/2015  . Dehydration 02/08/2015  . Hyponatremia 02/08/2015  . Well adult exam 10/01/2014  . Grief at loss of child 12/11/2013  . Irregular heart beat 09/29/2013  . Humeral head fracture 08/26/2013  . Actinic keratoses 08/26/2013  . Carotid bruit 05/02/2012  . Anxiety 03/17/2011  . ANEMIA, PERNICIOUS, HX OF 10/10/2007  . Peripheral neuropathy ([place]) 09/04/2007  . Chronic fatigue 08/06/2007  . Chronic lymphocytic leukemia ([place]) 06/17/2007  . ALLERGIC RHINITIS 06/17/2007  . OSTEOARTHRITIS 06/17/2007  . B12 deficiency 06/07/2007  . GASTROESOPHAGEAL REFLUX DISEASE 04/30/2007  . Dyslipidemia  12/19/2006  . Essential hypertension 12/19/2006  . DIVERTICULOSIS, COLON 05/19/2003  . COLONIC POLYPS, HX OF 01/10/2000   Past Medical History:  Diagnosis Date  . Allergy    rhinitis  . Chronic ethmoidal sinusitis   . Diverticulosis of colon   . Glaucoma   . Hx of colonic polyps   . Hyperlipidemia   . Hypertension   . Hypogammaglobulinemia ([place])    Monthly IVIG  . Leukemia ([place])    Chronic lymphocytic leukemia  . Osteoarthritis   . Peripheral neuropathy   . Pernicious anemia   . UTI (lower urinary tract infection) 2010  . Vitamin B12 deficiency    Past Surgical History:  Procedure Laterality Date  . ABDOMINAL HYSTERECTOMY  1987  . bilateral cataracts  2007  . bladder tack  1987  . L4-L5 Laminetomy  1999  . TUBAL LIGATION    . tubaligation  1961  . ureter reconstruction  1992   Social History  Substance Use Topics  . Smoking status: Never Smoker  . Smokeless tobacco: Never Used  . Alcohol use No   Family History  Problem Relation Age of Onset  . Jaundice Father   . Stroke Son 52       in NH  . Cancer Mother        uterine  . Diabetes Mother   . Other Sister        brain tumor  . Cancer Sister   . Hypertension Other   . Cancer Other  breast  . Thyroid disease Sister   . Cancer Sister        thyroid cancer   Allergies  Allergen Reactions  . Pneumococcal Vaccines Swelling and Rash    Pneumococcal Vaccine-23 valent  . Clarithromycin Other (See Comments)    REACTION: sore mouth  . Hctz [Hydrochlorothiazide]     Low Na  . Oxycodone-Aspirin Other (See Comments)    REACTION: horrible nightmares  . Propoxyphene N-Acetaminophen Other (See Comments)    REACTION: couldn't wake her up   Current Outpatient Prescriptions on File Prior to Visit  Medication Sig Dispense Refill  . acetaminophen (TYLENOL) 500 MG tablet Take 500 mg by mouth 2 (two) times daily.     Marland Kitchen amLODipine (NORVASC) 5 MG tablet TAKE 1 TABLET (5 MG TOTAL) BY MOUTH DAILY. 30 tablet 11    . Ascorbic Acid (VITAMIN C PO) Take 1 tablet by mouth daily.    Marland Kitchen aspirin 81 MG EC tablet Take 81 mg by mouth daily.      . B-D 3CC LUER-LOK SYR 25GX1" 25G X 1" 3 ML MISC USE TO ADMINISTER B12 INJECTIONS 10 each 2  . Bioflavonoid Products (GRAPE SEED PO) Take 1 tablet by mouth daily.    . Cholecalciferol (VITAMIN D3) 2000 units capsule Take 1 capsule (2,000 Units total) by mouth daily. 100 capsule 3  . conjugated estrogens (PREMARIN) vaginal cream 1/2 gram vaginally every night x 1 week, then twice weekly at bedtime 90 g 3  . cromolyn (OPTICROM) 4 % ophthalmic solution 1 DROP IN BOTH EYES FOUR TIMES A DAY  12  . cyanocobalamin (,VITAMIN B-12,) 1000 MCG/ML injection ADMINISTER 1 CC SUBQ EVERY 4 WEEKS AS DIRECTED 10 mL 0  . fluticasone (FLONASE) 50 MCG/ACT nasal spray Place 2 sprays into both nostrils daily. 48 g 0  . folic acid (FOLVITE) 1 MG tablet Take 1 mg by mouth daily. For restless legs    . gabapentin (NEURONTIN) 300 MG capsule Take 1 capsule (300 mg total) by mouth daily. 90 capsule 1  . GINSENG PO Take 1 tablet by mouth daily.    . hydrochlorothiazide (MICROZIDE) 12.5 MG capsule TAKE ONE CAPSULE BY MOUTH EVERY DAY FOR BLOOD PRESSURE 90 capsule 1  . Insulin Syringe-Needle U-100 (B-D INS SYR ULTRAFINE 1CC/30G) 30G X 1/2" 1 ML MISC USE TO ADMINISTER B12 INJECTIONS 10 each 1  . Insulin Syringe-Needle U-100 (B-D INS SYRINGE 0.5CC/30GX1/2") 30G X 1/2" 0.5 ML MISC by Does not apply route. To administer B12 injections     . latanoprost (XALATAN) 0.005 % ophthalmic solution Place 1 drop into both eyes at bedtime. 2.5 mL 5  . loratadine (CLARITIN) 10 MG tablet TAKE 1 TABLET (10 MG TOTAL) BY MOUTH DAILY. 90 tablet 3  . MAGNESIUM CHLORIDE PO Take 1 tablet by mouth daily.    . Multiple Vitamin (MULTI-DAY PO) Take 1 tablet by mouth daily.    Marland Kitchen omeprazole (PRILOSEC) 40 MG capsule Take 1 capsule (40 mg total) by mouth 2 (two) times daily. 60 capsule 11  . potassium chloride (KLOR-CON M10) 10 MEQ tablet  Take 1 tablet (10 mEq total) by mouth daily. 90 tablet 1  . ranitidine (ZANTAC) 150 MG tablet Take 1 tablet (150 mg total) by mouth at bedtime. 30 tablet 11  . simvastatin (ZOCOR) 20 MG tablet TAKE 1 TABLET (20 MG TOTAL) BY MOUTH DAILY. (Patient taking differently: TAKE 1 TABLET (10 MG TOTAL) BY MOUTH DAILY.) 90 tablet 2   No current facility-administered medications on file  prior to visit.     Review of Systems Review of Systems  Constitutional: Negative for fever, appetite change, fatigue and unexpected weight change.  ENT neg for cong/rhinorrhea/ear or sinus pain  Eyes: Negative for pain and visual disturbance.  Respiratory: Negative for cough and shortness of breath.   Cardiovascular: Negative for cp or palpitations    Gastrointestinal: Negative for nausea, diarrhea and constipation.  Genitourinary: pos for urgency and frequency. neg for flank pain or hematuria  Skin: Negative for pallor or rash   Neurological: Negative for weakness, light-headedness, numbness and headaches.  Hematological: Negative for adenopathy. Does not bruise/bleed easily.  Psychiatric/Behavioral: Negative for dysphoric mood. The patient is not nervous/anxious.         Objective:   Physical Exam  Constitutional: She appears well-developed and well-nourished. No distress.  Well appearing elderly female   HENT:  Head: Normocephalic and atraumatic.  Scant white coating on tongue (palate is clear)  Nares and throat are clear No sinus tenderness  Eyes: Pupils are equal, round, and reactive to light. Conjunctivae and EOM are normal.  Neck: Normal range of motion. Neck supple.  Cardiovascular: Normal rate, regular rhythm and normal heart sounds.   Pulmonary/Chest: Effort normal and breath sounds normal.  Abdominal: Soft. Bowel sounds are normal. She exhibits no distension. There is tenderness. There is no rebound.  No cva tenderness  Mild suprapubic tenderness-no rebound or guarding (exam done sitting)      Musculoskeletal: She exhibits no edema.  Lymphadenopathy:    She has no cervical adenopathy.  Neurological: She is alert.  Skin: No rash noted. No pallor.  Psychiatric: She has a normal mood and affect.          Assessment & Plan:   Problem List Items Addressed This Visit      Digestive   Thrush    Pt has had a chronic hx of burning mouth- but symptoms are different and extend to throat now  On abx White coating on tongue  Suspect thrush Px nystatin oral solun for tid use Update if not starting to improve in a week or if worsening        Relevant Medications   nystatin (MYCOSTATIN) 100000 UNIT/ML suspension     Other   Frequent urination    Recent tx for uti with keflex  Adv to stop her 2nd course of keflex  Symptoms are not resolved  ua is negative  May need a urine cx with pcp next week if no imp and poss addnl eval  Suggested drinking only water and avoiding acidic food and beverages that could irritate bladder

## 2017-02-11 NOTE — Assessment & Plan Note (Signed)
Recent tx for uti with keflex  Adv to stop her 2nd course of keflex  Symptoms are not resolved  ua is negative  May need a urine cx with pcp next week if no imp and poss addnl eval  Suggested drinking only water and avoiding acidic food and beverages that could irritate bladder

## 2017-02-11 NOTE — Assessment & Plan Note (Signed)
Pt has had a chronic hx of burning mouth- but symptoms are different and extend to throat now  On abx White coating on tongue  Suspect thrush Px nystatin oral solun for tid use Update if not starting to improve in a week or if worsening

## 2017-02-12 ENCOUNTER — Telehealth: Payer: Self-pay | Admitting: Internal Medicine

## 2017-02-12 NOTE — Telephone Encounter (Signed)
Pt notified and voiced understanding 

## 2017-02-12 NOTE — Telephone Encounter (Signed)
Take Cranberry tablets daily Thx

## 2017-02-12 NOTE — Telephone Encounter (Signed)
Please advise.  Provider stated, patient is also on antibiotic for thrush. Frequent urination     Recent tx for uti with keflex  Adv to stop her 2nd course of keflex  Symptoms are not resolved  ua is negative  May need a urine cx with pcp next week if no imp and poss addnl eval  Suggested drinking only water and avoiding acidic food and beverages that could irritate bladder

## 2017-02-12 NOTE — Telephone Encounter (Signed)
Pt was seen at [facility] and was told to come in to have her urine tested for a bladder infection. Can Dr Alain Marion put in orders for this? She was expecting them to already be in the system because she was told that she could just come by and have it done. She would like a call back with the status of the orders.

## 2017-02-17 ENCOUNTER — Ambulatory Visit (INDEPENDENT_AMBULATORY_CARE_PROVIDER_SITE_OTHER): Payer: Medicare Other | Admitting: Family Medicine

## 2017-02-17 ENCOUNTER — Other Ambulatory Visit (HOSPITAL_COMMUNITY)
Admission: RE | Admit: 2017-02-17 | Discharge: 2017-02-17 | Disposition: A | Discharge status: Home / Self Care | Payer: Medicare Other | Source: Other Acute Inpatient Hospital | Attending: Family Medicine | Admitting: Family Medicine

## 2017-02-17 VITALS — BP 110/70 | HR 76 | Temp 97.9°F | Resp 14 | Ht 63.0 in | Wt 135.0 lb

## 2017-02-17 DIAGNOSIS — B370 Candidal stomatitis: Secondary | ICD-10-CM | POA: Diagnosis not present

## 2017-02-17 DIAGNOSIS — N3000 Acute cystitis without hematuria: Secondary | ICD-10-CM

## 2017-02-17 LAB — POC URINALSYSI DIPSTICK (AUTOMATED)
BILIRUBIN UA: NEGATIVE
GLUCOSE UA: NEGATIVE
Ketones, UA: NEGATIVE
Nitrite, UA: NEGATIVE
Protein, UA: NEGATIVE
RBC UA: NEGATIVE
SPEC GRAV UA: 1.02 (ref 1.010–1.025)
UROBILINOGEN UA: 0.2 U/dL
pH, UA: 6 (ref 5.0–8.0)

## 2017-02-17 MED ORDER — SULFAMETHOXAZOLE-TRIMETHOPRIM 800-160 MG PO TABS: 1.0000 | ORAL_TABLET | Freq: Two times a day (BID) | ORAL | 0 refills | Status: DC

## 2017-02-17 NOTE — Progress Notes (Signed)
Pre visit review using our clinic review tool, if applicable. No additional management support is needed unless otherwise documented below in the visit note. 

## 2017-02-17 NOTE — Assessment & Plan Note (Signed)
Recent dx thrush in setting of chronic burning mouth. rec continue nystatin. ?keflex causing worsening burning mouth - will stop this and change abx. See above. Pt agrees with plan.

## 2017-02-17 NOTE — Patient Instructions (Addendum)
Urine culture sent today. Continue nystatin. Stop keflex - possibly causes mouth soreness.  Start new antibiotic bactrim 1 tablet twice daily for 5 days.  Continue pushing lots of water, avoid caffeinated beverages and spicy foods.

## 2017-02-17 NOTE — Assessment & Plan Note (Signed)
Ongoing symptoms of urgency/frequency despite 2nd course of keflex. rec stop keflex (see below). Start bactrim DS BID x 5 days. UCx sent. Further supportive care as per instructions. Update if not improving with treatment. Pt and son agree with plan.

## 2017-02-17 NOTE — Progress Notes (Signed)
BP 110/70 (BP Location: Left Arm, Patient Position: Sitting, Cuff Size: Normal)   Pulse 76   Temp 97.9 F (36.6 C) (Oral)   Resp 14   Ht 5\' 3"  (1.6 m)   Wt 135 lb (61.2 kg)   SpO2 97%   BMI 23.91 kg/m    CC: UTI? Subjective:    Patient ID: [name], female    DOB: Jan 27, 1926, 83 y.o.   MRN: [id number]  HPI: [name] is a 83 y.o. female presenting on 02/17/2017 for Urinary Tract Infection   Here with son Edd Arbour.   H/o UTI last week - treated with 2 5d courses of keflex, no UA/UCx in system. Seen last week at [facility] with thrush after keflex, treated with nystatin swish/swallow. This didn't help - ongoing burning tongue and mouth. Second keflex course was stopped last week after urinalysis was normal at [facility]. However given ongoing UTI sxs, urgency and frequency. Denies dysuria, hematuria, fevers/chills, nausea/vomiting or flank pain. Checked urine with OTC Azo test - tested positive for UTI (tr LE and + nitrite). She restarted keflex and has been taking for past 5 days. She is also taking cranberry tablets.   H/o CLL currently undergoing infusion treatment.  Staying well hydrated.   Relevant past medical, surgical, family and social history reviewed and updated as indicated. Interim medical history since our last visit reviewed. Allergies and medications reviewed and updated. Outpatient Medications Prior to Visit  Medication Sig Dispense Refill  . acetaminophen (TYLENOL) 500 MG tablet Take 500 mg by mouth 2 (two) times daily.     Marland Kitchen amLODipine (NORVASC) 5 MG tablet TAKE 1 TABLET (5 MG TOTAL) BY MOUTH DAILY. 30 tablet 11  . Ascorbic Acid (VITAMIN C PO) Take 1 tablet by mouth daily.    Marland Kitchen aspirin 81 MG EC tablet Take 81 mg by mouth daily.      . B-D 3CC LUER-LOK SYR 25GX1" 25G X 1" 3 ML MISC USE TO ADMINISTER B12 INJECTIONS 10 each 2  . Bioflavonoid Products (GRAPE SEED PO) Take 1 tablet by mouth daily.    . Cholecalciferol (VITAMIN D3) 2000 units capsule  Take 1 capsule (2,000 Units total) by mouth daily. 100 capsule 3  . conjugated estrogens (PREMARIN) vaginal cream 1/2 gram vaginally every night x 1 week, then twice weekly at bedtime 90 g 3  . cromolyn (OPTICROM) 4 % ophthalmic solution 1 DROP IN BOTH EYES FOUR TIMES A DAY  12  . cyanocobalamin (,VITAMIN B-12,) 1000 MCG/ML injection ADMINISTER 1 CC SUBQ EVERY 4 WEEKS AS DIRECTED 10 mL 0  . fluticasone (FLONASE) 50 MCG/ACT nasal spray Place 2 sprays into both nostrils daily. 48 g 0  . folic acid (FOLVITE) 1 MG tablet Take 1 mg by mouth daily. For restless legs    . gabapentin (NEURONTIN) 300 MG capsule Take 1 capsule (300 mg total) by mouth daily. 90 capsule 1  . GINSENG PO Take 1 tablet by mouth daily.    . hydrochlorothiazide (MICROZIDE) 12.5 MG capsule TAKE ONE CAPSULE BY MOUTH EVERY DAY FOR BLOOD PRESSURE 90 capsule 1  . Insulin Syringe-Needle U-100 (B-D INS SYR ULTRAFINE 1CC/30G) 30G X 1/2" 1 ML MISC USE TO ADMINISTER B12 INJECTIONS 10 each 1  . Insulin Syringe-Needle U-100 (B-D INS SYRINGE 0.5CC/30GX1/2") 30G X 1/2" 0.5 ML MISC by Does not apply route. To administer B12 injections     . latanoprost (XALATAN) 0.005 % ophthalmic solution Place 1 drop into both eyes at bedtime.  2.5 mL 5  . loratadine (CLARITIN) 10 MG tablet TAKE 1 TABLET (10 MG TOTAL) BY MOUTH DAILY. 90 tablet 3  . MAGNESIUM CHLORIDE PO Take 1 tablet by mouth daily.    . Multiple Vitamin (MULTI-DAY PO) Take 1 tablet by mouth daily.    Marland Kitchen nystatin (MYCOSTATIN) 100000 UNIT/ML suspension Take 5 mLs (500,000 Units total) by mouth 3 (three) times daily. Swish and swallow 120 mL 0  . omeprazole (PRILOSEC) 40 MG capsule Take 1 capsule (40 mg total) by mouth 2 (two) times daily. 60 capsule 11  . potassium chloride (KLOR-CON M10) 10 MEQ tablet Take 1 tablet (10 mEq total) by mouth daily. 90 tablet 1  . ranitidine (ZANTAC) 150 MG tablet Take 1 tablet (150 mg total) by mouth at bedtime. 30 tablet 11  . simvastatin (ZOCOR) 20 MG tablet TAKE  1 TABLET (20 MG TOTAL) BY MOUTH DAILY. (Patient taking differently: TAKE 1 TABLET (10 MG TOTAL) BY MOUTH DAILY.) 90 tablet 2   No facility-administered medications prior to visit.      Per HPI unless specifically indicated in ROS section below Review of Systems     Objective:    BP 110/70 (BP Location: Left Arm, Patient Position: Sitting, Cuff Size: Normal)   Pulse 76   Temp 97.9 F (36.6 C) (Oral)   Resp 14   Ht 5\' 3"  (1.6 m)   Wt 135 lb (61.2 kg)   SpO2 97%   BMI 23.91 kg/m   Wt Readings from Last 3 Encounters:  02/17/17 135 lb (61.2 kg)  02/10/17 135 lb 0.6 oz (61.3 kg)  01/09/17 138 lb (62.6 kg)    Physical Exam  Constitutional: She appears well-developed and well-nourished. No distress.  HENT:  Mouth/Throat: Oropharynx is clear and moist. No oropharyngeal exudate.  Slight white film lateral tongue  Cardiovascular: Normal rate, regular rhythm, normal heart sounds and intact distal pulses.   No murmur heard. Pulmonary/Chest: Effort normal and breath sounds normal. No respiratory distress. She has no wheezes. She has no rales.  Abdominal: Soft. Bowel sounds are normal. She exhibits no distension and no mass. There is no hepatosplenomegaly. There is tenderness (mild) in the suprapubic area. There is no rebound, no guarding and no CVA tenderness.  Musculoskeletal: She exhibits no edema.  Skin: Skin is warm and dry. No rash noted.  Psychiatric: She has a normal mood and affect.  Nursing note and vitals reviewed.  Lab Results  Component Value Date   CREATININE 0.88 11/23/2016       Assessment & Plan:   Problem List Items Addressed This Visit    Thrush    Recent dx thrush in setting of chronic burning mouth. rec continue nystatin. ?keflex causing worsening burning mouth - will stop this and change abx. See above. Pt agrees with plan.       Relevant Medications   sulfamethoxazole-trimethoprim (BACTRIM DS,SEPTRA DS) 800-160 MG tablet   Urinary tract infection without  hematuria - Primary    Ongoing symptoms of urgency/frequency despite 2nd course of keflex. rec stop keflex (see below). Start bactrim DS BID x 5 days. UCx sent. Further supportive care as per instructions. Update if not improving with treatment. Pt and son agree with plan.      Relevant Medications   Phenazopyrid-Cranbry-C-Probiot (AZO URINARY TRACT SUPPORT PO)   sulfamethoxazole-trimethoprim (BACTRIM DS,SEPTRA DS) 800-160 MG tablet   Other Relevant Orders   Urine Culture   POCT Urinalysis Dipstick (Automated) (Completed)       Follow  up plan: Return if symptoms worsen or fail to improve.  Ria Bush, MD

## 2017-02-19 ENCOUNTER — Telehealth: Payer: Self-pay | Admitting: Internal Medicine

## 2017-02-19 NOTE — Telephone Encounter (Signed)
Pt called for her results from the urine culture on 7/28

## 2017-02-19 NOTE — Telephone Encounter (Signed)
Please advise 

## 2017-02-19 NOTE — Telephone Encounter (Signed)
It was positive for a very small amount of bacteria

## 2017-02-19 NOTE — Telephone Encounter (Signed)
Pt.notified

## 2017-02-20 ENCOUNTER — Ambulatory Visit: Payer: Medicare Other | Admitting: Internal Medicine

## 2017-02-20 LAB — URINE CULTURE

## 2017-02-22 NOTE — Telephone Encounter (Signed)
Noted urine culture sensitive to bactrim.

## 2017-02-27 ENCOUNTER — Encounter: Payer: Self-pay | Admitting: Internal Medicine

## 2017-02-27 ENCOUNTER — Ambulatory Visit (INDEPENDENT_AMBULATORY_CARE_PROVIDER_SITE_OTHER): Payer: Medicare Other | Admitting: Internal Medicine

## 2017-02-27 DIAGNOSIS — E785 Hyperlipidemia, unspecified: Secondary | ICD-10-CM

## 2017-02-27 DIAGNOSIS — E538 Deficiency of other specified B group vitamins: Secondary | ICD-10-CM | POA: Diagnosis not present

## 2017-02-27 DIAGNOSIS — K146 Glossodynia: Secondary | ICD-10-CM

## 2017-02-27 DIAGNOSIS — R300 Dysuria: Secondary | ICD-10-CM

## 2017-02-27 DIAGNOSIS — R350 Frequency of micturition: Secondary | ICD-10-CM | POA: Diagnosis not present

## 2017-02-27 DIAGNOSIS — R609 Edema, unspecified: Secondary | ICD-10-CM | POA: Diagnosis not present

## 2017-02-27 DIAGNOSIS — I10 Essential (primary) hypertension: Secondary | ICD-10-CM | POA: Diagnosis not present

## 2017-02-27 MED ORDER — AMLODIPINE BESYLATE 2.5 MG PO TABS: 2.5000 mg | ORAL_TABLET | Freq: Every day | ORAL | 3 refills | Status: DC

## 2017-02-27 MED ORDER — SIMVASTATIN 10 MG PO TABS: 10.0000 mg | ORAL_TABLET | Freq: Every day | ORAL | 3 refills | Status: DC

## 2017-02-27 NOTE — Patient Instructions (Addendum)
Use [employer] Peroxicare tooth paste -- use 3-4 times aday  Elastic compression knee highs

## 2017-02-27 NOTE — Assessment & Plan Note (Signed)
On B12 

## 2017-02-27 NOTE — Assessment & Plan Note (Signed)
UTI - resolved w/Bactrim

## 2017-02-27 NOTE — Assessment & Plan Note (Signed)
Amlodipine, HCTZ °

## 2017-02-27 NOTE — Assessment & Plan Note (Signed)
Reduce amlodipine to 2.5mg.

## 2017-02-27 NOTE — Assessment & Plan Note (Signed)
Use [employer] Peroxicare tooth paste  

## 2017-02-27 NOTE — Assessment & Plan Note (Signed)
Zocor 10 mg a day - reduced dose

## 2017-02-27 NOTE — Progress Notes (Signed)
Subjective:  Patient ID: [name], female    DOB: 1925/12/30  Age: 83 y.o. MRN: [id number]  CC: No chief complaint on file.   HPI [name] presents for UTI - resolved w/Bactrim, HTN, GERD, dyslipidemia f/u. C/o mouth burning  Outpatient Medications Prior to Visit  Medication Sig Dispense Refill  . acetaminophen (TYLENOL) 500 MG tablet Take 500 mg by mouth 2 (two) times daily.     Marland Kitchen amLODipine (NORVASC) 5 MG tablet TAKE 1 TABLET (5 MG TOTAL) BY MOUTH DAILY. 30 tablet 11  . Ascorbic Acid (VITAMIN C PO) Take 1 tablet by mouth daily.    Marland Kitchen aspirin 81 MG EC tablet Take 81 mg by mouth daily.      . B-D 3CC LUER-LOK SYR 25GX1" 25G X 1" 3 ML MISC USE TO ADMINISTER B12 INJECTIONS 10 each 2  . Bioflavonoid Products (GRAPE SEED PO) Take 1 tablet by mouth daily.    . Cholecalciferol (VITAMIN D3) 2000 units capsule Take 1 capsule (2,000 Units total) by mouth daily. 100 capsule 3  . conjugated estrogens (PREMARIN) vaginal cream 1/2 gram vaginally every night x 1 week, then twice weekly at bedtime 90 g 3  . cromolyn (OPTICROM) 4 % ophthalmic solution 1 DROP IN BOTH EYES FOUR TIMES A DAY  12  . cyanocobalamin (,VITAMIN B-12,) 1000 MCG/ML injection ADMINISTER 1 CC SUBQ EVERY 4 WEEKS AS DIRECTED 10 mL 0  . fluticasone (FLONASE) 50 MCG/ACT nasal spray Place 2 sprays into both nostrils daily. 48 g 0  . folic acid (FOLVITE) 1 MG tablet Take 1 mg by mouth daily. For restless legs    . gabapentin (NEURONTIN) 300 MG capsule Take 1 capsule (300 mg total) by mouth daily. 90 capsule 1  . GINSENG PO Take 1 tablet by mouth daily.    . hydrochlorothiazide (MICROZIDE) 12.5 MG capsule TAKE ONE CAPSULE BY MOUTH EVERY DAY FOR BLOOD PRESSURE 90 capsule 1  . Insulin Syringe-Needle U-100 (B-D INS SYR ULTRAFINE 1CC/30G) 30G X 1/2" 1 ML MISC USE TO ADMINISTER B12 INJECTIONS 10 each 1  . Insulin Syringe-Needle U-100 (B-D INS SYRINGE 0.5CC/30GX1/2") 30G X 1/2" 0.5 ML MISC by Does not apply route. To administer B12  injections     . latanoprost (XALATAN) 0.005 % ophthalmic solution Place 1 drop into both eyes at bedtime. 2.5 mL 5  . loratadine (CLARITIN) 10 MG tablet TAKE 1 TABLET (10 MG TOTAL) BY MOUTH DAILY. 90 tablet 3  . MAGNESIUM CHLORIDE PO Take 1 tablet by mouth daily.    . Multiple Vitamin (MULTI-DAY PO) Take 1 tablet by mouth daily.    . Multiple Vitamins-Minerals (CENTRUM SILVER 50+WOMEN) TABS Take by mouth.    . nystatin (MYCOSTATIN) 100000 UNIT/ML suspension Take 5 mLs (500,000 Units total) by mouth 3 (three) times daily. Swish and swallow 120 mL 0  . omeprazole (PRILOSEC) 40 MG capsule Take 1 capsule (40 mg total) by mouth 2 (two) times daily. 60 capsule 11  . Phenazopyrid-Cranbry-C-Probiot (AZO URINARY TRACT SUPPORT PO) Take by mouth.    . potassium chloride (KLOR-CON M10) 10 MEQ tablet Take 1 tablet (10 mEq total) by mouth daily. 90 tablet 1  . ranitidine (ZANTAC) 150 MG tablet Take 1 tablet (150 mg total) by mouth at bedtime. 30 tablet 11  . simvastatin (ZOCOR) 20 MG tablet TAKE 1 TABLET (20 MG TOTAL) BY MOUTH DAILY. (Patient taking differently: TAKE 1 TABLET (10 MG TOTAL) BY MOUTH DAILY.) 90 tablet 2  . sulfamethoxazole-trimethoprim (BACTRIM DS,SEPTRA  DS) 800-160 MG tablet Take 1 tablet by mouth 2 (two) times daily. 10 tablet 0  . vitamin E 400 UNIT capsule Take 400 Units by mouth daily.    . Ascorbic Acid (VITAMIN C) 500 MG CAPS Take by mouth.     No facility-administered medications prior to visit.     ROS Review of Systems  Constitutional: Positive for fatigue. Negative for activity change, appetite change, chills and unexpected weight change.  HENT: Negative for congestion, mouth sores and sinus pressure.   Eyes: Negative for visual disturbance.  Respiratory: Negative for cough and chest tightness.   Cardiovascular: Positive for leg swelling.  Gastrointestinal: Negative for abdominal pain and nausea.  Genitourinary: Negative for difficulty urinating, frequency and vaginal pain.    Musculoskeletal: Negative for back pain and gait problem.  Skin: Negative for pallor and rash.  Neurological: Positive for weakness. Negative for dizziness, tremors, numbness and headaches.  Psychiatric/Behavioral: Negative for confusion and sleep disturbance. The patient is nervous/anxious.     Objective:  BP 130/76 (BP Location: Left Arm, Patient Position: Sitting, Cuff Size: Normal)   Pulse 73   Temp 97.6 F (36.4 C) (Oral)   Ht 5\' 3"  (1.6 m)   Wt 135 lb (61.2 kg)   SpO2 98%   BMI 23.91 kg/m   BP Readings from Last 3 Encounters:  02/27/17 130/76  02/17/17 110/70  02/10/17 128/80    Wt Readings from Last 3 Encounters:  02/27/17 135 lb (61.2 kg)  02/17/17 135 lb (61.2 kg)  02/10/17 135 lb 0.6 oz (61.3 kg)    Physical Exam  Constitutional: She appears well-developed. No distress.  HENT:  Head: Normocephalic.  Right Ear: External ear normal.  Left Ear: External ear normal.  Nose: Nose normal.  Mouth/Throat: Oropharynx is clear and moist.  Eyes: Pupils are equal, round, and reactive to light. Conjunctivae are normal. Right eye exhibits no discharge. Left eye exhibits no discharge.  Neck: Normal range of motion. Neck supple. No JVD present. No tracheal deviation present. No thyromegaly present.  Cardiovascular: Normal rate, regular rhythm and normal heart sounds.   Pulmonary/Chest: No stridor. No respiratory distress. She has no wheezes.  Abdominal: Soft. Bowel sounds are normal. She exhibits no distension and no mass. There is no tenderness. There is no rebound and no guarding.  Musculoskeletal: She exhibits no edema or tenderness.  Lymphadenopathy:    She has no cervical adenopathy.  Neurological: She displays normal reflexes. No cranial nerve deficit. She exhibits normal muscle tone. Coordination abnormal.  Skin: No rash noted. No erythema.  Psychiatric: She has a normal mood and affect. Her behavior is normal. Judgment and thought content normal.  walker LE w/o  edema Mouth - clear  Lab Results  Component Value Date   WBC 5.6 10/17/2016   HGB 13.3 10/17/2016   HCT 39.0 10/17/2016   PLT 147 10/17/2016   GLUCOSE 83 11/23/2016   CHOL 191 10/01/2014   TRIG 72.0 10/01/2014   HDL 57.80 10/01/2014   LDLCALC 119 (H) 10/01/2014   ALT 15 11/23/2016   AST 21 11/23/2016   NA 134 (L) 11/23/2016   K 4.3 11/23/2016   CL 97 11/23/2016   CREATININE 0.88 11/23/2016   BUN 25 (H) 11/23/2016   CO2 31 11/23/2016   TSH 3.38 12/03/2015   INR 1.05 05/10/2016   HGBA1C  10/07/2008    5.9 (NOTE)   The ADA recommends the following therapeutic goal for glycemic   control related to Hgb A1C measurement:  Goal of Therapy:   < 7.0% Hgb A1C   Reference: American Diabetes Association: Clinical Practice   Recommendations 2008, Diabetes Care,  2008, 31:(Suppl 1).   MICROALBUR 0.4 07/14/2009    No results found.  Assessment & Plan:   There are no diagnoses linked to this encounter. I have discontinued [name]'s Vitamin C. I am also having her maintain her Insulin Syringe-Needle U-100, aspirin, acetaminophen, folic acid, B-D 3CC LUER-LOK SYR 25GX1", amLODipine, conjugated estrogens, simvastatin, latanoprost, cromolyn, loratadine, cyanocobalamin, Ascorbic Acid (VITAMIN C PO), GINSENG PO, Multiple Vitamin (MULTI-DAY PO), Bioflavonoid Products (GRAPE SEED PO), MAGNESIUM CHLORIDE PO, Insulin Syringe-Needle U-100, gabapentin, potassium chloride, fluticasone, Vitamin D3, omeprazole, ranitidine, hydrochlorothiazide, nystatin, Phenazopyrid-Cranbry-C-Probiot (AZO URINARY TRACT SUPPORT PO), CENTRUM SILVER 50+WOMEN, vitamin E, and sulfamethoxazole-trimethoprim.  No orders of the defined types were placed in this encounter.    Follow-up: No Follow-up on file.  Walker Kehr, MD

## 2017-03-04 NOTE — Progress Notes (Signed)
I was available to supervise the injection. A. Harlene Petralia, MD  

## 2017-03-06 ENCOUNTER — Ambulatory Visit: Payer: Medicare Other | Admitting: Oncology

## 2017-03-06 ENCOUNTER — Ambulatory Visit: Payer: Medicare Other

## 2017-03-06 ENCOUNTER — Ambulatory Visit (HOSPITAL_BASED_OUTPATIENT_CLINIC_OR_DEPARTMENT_OTHER): Payer: Medicare Other

## 2017-03-06 ENCOUNTER — Telehealth: Payer: Self-pay | Admitting: Oncology

## 2017-03-06 ENCOUNTER — Ambulatory Visit (HOSPITAL_BASED_OUTPATIENT_CLINIC_OR_DEPARTMENT_OTHER): Payer: Medicare Other | Admitting: Oncology

## 2017-03-06 VITALS — BP 122/74 | HR 82 | Temp 98.0°F | Resp 18 | Ht 63.0 in | Wt 136.3 lb

## 2017-03-06 VITALS — BP 130/73 | HR 62 | Temp 98.3°F | Resp 18

## 2017-03-06 DIAGNOSIS — D801 Nonfamilial hypogammaglobulinemia: Secondary | ICD-10-CM

## 2017-03-06 DIAGNOSIS — C9110 Chronic lymphocytic leukemia of B-cell type not having achieved remission: Secondary | ICD-10-CM | POA: Diagnosis not present

## 2017-03-06 DIAGNOSIS — R911 Solitary pulmonary nodule: Secondary | ICD-10-CM | POA: Diagnosis not present

## 2017-03-06 MED ORDER — DIPHENHYDRAMINE HCL 25 MG PO CAPS
ORAL_CAPSULE | ORAL | Status: AC
  Filled 2017-03-06: qty 1

## 2017-03-06 MED ORDER — DIPHENHYDRAMINE HCL 25 MG PO CAPS
25.0000 mg | ORAL_CAPSULE | Freq: Once | ORAL | Status: AC
  Administered 2017-03-06: 25 mg via ORAL

## 2017-03-06 MED ORDER — IMMUNE GLOBULIN (HUMAN) 10 GM/100ML IV SOLN
30.0000 g | Freq: Once | INTRAVENOUS | Status: AC
Start: 2017-03-06 — End: 2017-03-06
  Administered 2017-03-06: 30 g via INTRAVENOUS
  Filled 2017-03-06: qty 200

## 2017-03-06 NOTE — Telephone Encounter (Signed)
Scheduled appt per 8/14 los - patient to get new schedule in the treatment area,.

## 2017-03-06 NOTE — Progress Notes (Signed)
  [place] OFFICE PROGRESS NOTE   Diagnosis: CLL  INTERVAL HISTORY:   [name] returns as scheduled. She continues monthly IVIG. She believes the IVIG is decreasing the frequency/severity of infections. She was treated with for a urinary tract infection 02/17/2017. No fever or night sweats. She reports burning of the tongue for the past year. Nystatin has not helped.  Objective:  Vital signs in last 24 hours:  Blood pressure 122/74, pulse 82, temperature 98 F (36.7 C), temperature source Oral, resp. rate 18, height 5\' 3"  (1.6 m), weight 136 lb 4.8 oz (61.8 kg), SpO2 98 %.    HEENT: Oropharynx without thrush or ulcers. The tongue appears unremarkable. Lymphatics: No cervical or supraclavicular nodes Resp: Lungs clear bilaterally Cardio: Regular rate and rhythm GI: No hepatosplenomegaly, nontender Vascular: No leg edema   Lab Results:  Lab Results  Component Value Date   WBC 5.6 10/17/2016   HGB 13.3 10/17/2016   HCT 39.0 10/17/2016   MCV 93.9 10/17/2016   PLT 147 10/17/2016   NEUTROABS 3.6 10/17/2016     Medications: I have reviewed the patient's current medications.  Assessment/Plan: 1. Chronic lymphocytic leukemia, asymptomatic. 2. History of hypogammaglobulinemia with recurrent infections. She continues monthly IVIG replacement therapy. 3. History of recurrent urinary tract infections. 4. History of mild thrombocytopenia secondary to CLL.                                     5.   Question right upper lobe nodule on chest x-ray 05/09/2016; follow-up chest x-ray 09/19/2016 with more confluent irregular nodular density right upper lobe measuring 3 x 1.8 cm. repeat chest x-ray 01/09/2017-regression of irregular nodular density in the right upper lobe felt to represent interval scarring    Disposition:  [name] appears unchanged. She will continue monthly IVIG. She will be scheduled for an office visit and follow-up chest x-ray in 3  months.  15 minutes were spent with the patient today. The majority of the time was used for counseling and coordination of care.  Donneta Romberg, MD  03/06/2017  9:42 AM

## 2017-03-06 NOTE — Patient Instructions (Signed)

## 2017-03-09 ENCOUNTER — Other Ambulatory Visit: Payer: Self-pay | Admitting: Internal Medicine

## 2017-03-26 ENCOUNTER — Other Ambulatory Visit: Payer: Self-pay | Admitting: Internal Medicine

## 2017-03-26 DIAGNOSIS — C9110 Chronic lymphocytic leukemia of B-cell type not having achieved remission: Secondary | ICD-10-CM

## 2017-03-27 ENCOUNTER — Other Ambulatory Visit: Payer: Self-pay | Admitting: General Practice

## 2017-03-27 DIAGNOSIS — C9110 Chronic lymphocytic leukemia of B-cell type not having achieved remission: Secondary | ICD-10-CM

## 2017-03-27 MED ORDER — LATANOPROST 0.005 % OP SOLN: 1.0000 [drp] | Freq: Every day | OPHTHALMIC | 3 refills | Status: DC

## 2017-04-02 ENCOUNTER — Telehealth: Payer: Self-pay

## 2017-04-02 ENCOUNTER — Other Ambulatory Visit: Payer: Self-pay

## 2017-04-02 ENCOUNTER — Other Ambulatory Visit: Payer: Self-pay | Admitting: Nurse Practitioner

## 2017-04-02 DIAGNOSIS — C9110 Chronic lymphocytic leukemia of B-cell type not having achieved remission: Secondary | ICD-10-CM

## 2017-04-02 NOTE — Telephone Encounter (Signed)
Call placed back to patient at her request. Pt states that she thinks she has a bladder infection and was wanting to a UA when she comes to her lab appointment tomorrow. This RN informed the pt that the MD would be notified of her request. Pt verbalizes understanding and agrees with plan of care.

## 2017-04-03 ENCOUNTER — Ambulatory Visit: Payer: Medicare Other

## 2017-04-03 ENCOUNTER — Ambulatory Visit (HOSPITAL_BASED_OUTPATIENT_CLINIC_OR_DEPARTMENT_OTHER): Payer: Medicare Other

## 2017-04-03 ENCOUNTER — Ambulatory Visit (HOSPITAL_BASED_OUTPATIENT_CLINIC_OR_DEPARTMENT_OTHER): Payer: Medicare Other | Admitting: Nurse Practitioner

## 2017-04-03 ENCOUNTER — Other Ambulatory Visit (HOSPITAL_BASED_OUTPATIENT_CLINIC_OR_DEPARTMENT_OTHER): Payer: Medicare Other

## 2017-04-03 VITALS — BP 142/77 | HR 88 | Temp 97.7°F | Resp 18 | Ht 63.0 in | Wt 139.2 lb

## 2017-04-03 VITALS — BP 132/70 | HR 60 | Temp 98.0°F | Resp 17

## 2017-04-03 DIAGNOSIS — C9110 Chronic lymphocytic leukemia of B-cell type not having achieved remission: Secondary | ICD-10-CM

## 2017-04-03 DIAGNOSIS — D801 Nonfamilial hypogammaglobulinemia: Secondary | ICD-10-CM

## 2017-04-03 LAB — CBC WITH DIFFERENTIAL/PLATELET
BASO%: 0.7 % (ref 0.0–2.0)
Basophils Absolute: 0 10*3/uL (ref 0.0–0.1)
EOS%: 1 % (ref 0.0–7.0)
Eosinophils Absolute: 0.1 10*3/uL (ref 0.0–0.5)
HEMATOCRIT: 44.1 % (ref 34.8–46.6)
HEMOGLOBIN: 15 g/dL (ref 11.6–15.9)
LYMPH%: 25.8 % (ref 14.0–49.7)
MCH: 31.7 pg (ref 25.1–34.0)
MCHC: 34 g/dL (ref 31.5–36.0)
MCV: 93.4 fL (ref 79.5–101.0)
MONO#: 0.5 10*3/uL (ref 0.1–0.9)
MONO%: 8 % (ref 0.0–14.0)
NEUT%: 64.5 % (ref 38.4–76.8)
NEUTROS ABS: 4 10*3/uL (ref 1.5–6.5)
Platelets: 136 10*3/uL — ABNORMAL LOW (ref 145–400)
RBC: 4.72 10*6/uL (ref 3.70–5.45)
RDW: 14.1 % (ref 11.2–14.5)
WBC: 6.3 10*3/uL (ref 3.9–10.3)
lymph#: 1.6 10*3/uL (ref 0.9–3.3)

## 2017-04-03 LAB — URINALYSIS, MICROSCOPIC - CHCC
BILIRUBIN (URINE): NEGATIVE
BLOOD: NEGATIVE
Glucose: NEGATIVE mg/dL
Ketones: NEGATIVE mg/dL
NITRITE: NEGATIVE
Protein: NEGATIVE mg/dL
RBC / HPF: NEGATIVE (ref 0–2)
Specific Gravity, Urine: 1.01 (ref 1.003–1.035)
Urobilinogen, UR: 0.2 mg/dL (ref 0.2–1)
pH: 6 (ref 4.6–8.0)

## 2017-04-03 MED ORDER — DIPHENHYDRAMINE HCL 25 MG PO CAPS
25.0000 mg | ORAL_CAPSULE | Freq: Once | ORAL | Status: AC
  Administered 2017-04-03: 25 mg via ORAL

## 2017-04-03 MED ORDER — DIPHENHYDRAMINE HCL 25 MG PO CAPS
ORAL_CAPSULE | ORAL | Status: AC
  Filled 2017-04-03: qty 1

## 2017-04-03 MED ORDER — IMMUNE GLOBULIN (HUMAN) 10 GM/100ML IV SOLN
30.0000 g | Freq: Once | INTRAVENOUS | Status: AC
  Administered 2017-04-03: 30 g via INTRAVENOUS
  Filled 2017-04-03: qty 200

## 2017-04-03 MED ORDER — DEXTROSE 5 % IV SOLN
INTRAVENOUS | Status: DC
  Administered 2017-04-03: 12:00:00 via INTRAVENOUS

## 2017-04-03 NOTE — Progress Notes (Signed)
  [facility] OFFICE PROGRESS NOTE   Diagnosis:  CLL  INTERVAL HISTORY:   [name] returns as scheduled. She continues monthly IVIG. No fever or night sweats. She noted dysuria beginning 4 days ago. Symptoms improved with AZO.  Objective:  Vital signs in last 24 hours:  Blood pressure (!) 142/77, pulse 88, temperature 97.7 F (36.5 C), temperature source Oral, resp. rate 18, height 5\' 3"  (1.6 m), weight 139 lb 3.2 oz (63.1 kg), SpO2 97 %.    HEENT: No thrush or ulcers. Lymphatics: No palpable cervical, supraclavicular or axillary lymph nodes. Resp: Lungs clear bilaterally. Cardio: Regular rate and rhythm. GI: Abdomen soft and nontender. No hepatosplenomegaly. Vascular: No leg edema.  Lab Results:  Lab Results  Component Value Date   WBC 6.3 04/03/2017   HGB 15.0 04/03/2017   HCT 44.1 04/03/2017   MCV 93.4 04/03/2017   PLT 136 (L) 04/03/2017   NEUTROABS 4.0 04/03/2017    Imaging:  No results found.  Medications: I have reviewed the patient's current medications.  Assessment/Plan: 1. Chronic lymphocytic leukemia, asymptomatic. 2. History of hypogammaglobulinemia with recurrent infections. She continues monthly IVIG replacement therapy. 3. History of recurrent urinary tract infections. 4. History of mild thrombocytopenia secondary to CLL.                                     5.   Question right upper lobe nodule on chest x-ray 05/09/2016; follow-up chest x-ray 09/19/2016 with more confluent irregular nodular density right upper lobe measuring 3 x 1.8 cm. repeat chest x-ray 01/09/2017-regression of irregular nodular density in the right upper lobe felt to represent interval scarring  Disposition: [name] appears stable. The plan is to continue monthly IVIG.   Urinalysis result reviewed. We will follow-up on the urine culture to guide appropriate antibiotic therapy.  She will return in 4 weeks for IVIG and in 8 weeks for a chest x-ray, follow-up  visit and IVIG.  Plan reviewed with Dr. Benay Spice.    Ned Card ANP/GNP-BC   04/03/2017  10:52 AM

## 2017-04-03 NOTE — Patient Instructions (Signed)

## 2017-04-04 LAB — URINE CULTURE: Organism ID, Bacteria: NO GROWTH

## 2017-04-05 ENCOUNTER — Telehealth: Payer: Self-pay | Admitting: *Deleted

## 2017-04-05 NOTE — Telephone Encounter (Signed)
Message from pt asking if prescription had been called to pharmacy. Discussed with Ned Card, NP: Urine culture negative, Dr. Benay Spice does not recommend antibiotic at this time. Call PCP if pain persists. Left message on voicemail for pt with above info.

## 2017-04-05 NOTE — Telephone Encounter (Signed)
S/w pt and she states she still has pressure/urgency and pain with urination. Also goes about every 1.5 hours when she usually goes about every 3 hours. Instructed her to call Dr Alain Marion per Dr Gearldine Shown instruction in attached message. Pt agreed.

## 2017-04-25 ENCOUNTER — Ambulatory Visit (INDEPENDENT_AMBULATORY_CARE_PROVIDER_SITE_OTHER): Payer: Medicare Other | Admitting: Internal Medicine

## 2017-04-25 ENCOUNTER — Other Ambulatory Visit: Payer: Medicare Other

## 2017-04-25 ENCOUNTER — Encounter: Payer: Self-pay | Admitting: Internal Medicine

## 2017-04-25 VITALS — BP 136/80 | HR 69 | Temp 98.3°F | Resp 16 | Wt 138.0 lb

## 2017-04-25 DIAGNOSIS — R300 Dysuria: Secondary | ICD-10-CM

## 2017-04-25 DIAGNOSIS — B370 Candidal stomatitis: Secondary | ICD-10-CM | POA: Diagnosis not present

## 2017-04-25 LAB — POCT URINALYSIS DIPSTICK
Bilirubin, UA: NEGATIVE
Blood, UA: NEGATIVE
Glucose, UA: NEGATIVE
Ketones, UA: NEGATIVE
LEUKOCYTES UA: NEGATIVE
NITRITE UA: NEGATIVE
Protein, UA: NEGATIVE
Spec Grav, UA: 1.015 (ref 1.010–1.025)
UROBILINOGEN UA: 0.2 U/dL
pH, UA: 6 (ref 5.0–8.0)

## 2017-04-25 MED ORDER — NYSTATIN 100000 UNIT/ML MT SUSP: 5.0000 mL | Freq: Three times a day (TID) | OROMUCOSAL | 0 refills | Status: DC

## 2017-04-25 NOTE — Assessment & Plan Note (Signed)
Urine dip is not suggestive of a urinary tract infection Will send urine for culture-will prescribe if positive, but hopefully can avoid given her possible recurrent thrush

## 2017-04-25 NOTE — Patient Instructions (Addendum)
Use the nystatin swish and swallow for 10 days.      Your preliminary urine results do not show an infection.  We will call you with the results of your culture.   You can continue the AZO.

## 2017-04-25 NOTE — Progress Notes (Signed)
Subjective:    Patient ID: [name], female    DOB: 05/18/1926, 83 y.o.   MRN: [id number]  HPI She is here for an acute visit.   ? UTI: she started having symptoms about one week ago.  She has dysuria, and frequent urination.  There is no change in color or odor of urine.  She denies fever.  She has some lower abdominal pain/pressure, but no nausea.  She is taking AZO.  She has had urinary tract infections in the past, but her most recent urine culture did not show infection.  Her tongue and throat burn:  This started over one year ago.  Over the summer she was diagnosed with thrush and was prescribed nystatin. She does not recall if this helps or not. She only recalls being prescribed Magic mouthwash and states it is not helping. She denies any difficulty or pain with swallowing.    Upper lip sweating:  This occurs when she is sick and has been occurring.  This is also one of the reasons she thinks she has a urinary tract infection.   Medications and allergies reviewed with patient and updated if appropriate.  Patient Active Problem List   Diagnosis Date Noted  . Thrush 02/10/2017  . Frequent urination 02/10/2017  . Osteoporosis 11/23/2016  . Abdominal pain 08/16/2016  . Urinary frequency 08/16/2016  . Peripheral edema 08/16/2016  . General weakness 08/16/2016  . Gait disorder 08/16/2016  . Dysuria 08/03/2016  . Syncope and collapse 05/10/2016  . Closed displaced simple supracondylar fracture of right humerus without intercondylar fracture 05/10/2016  . Urinary tract infection without hematuria 05/10/2016  . Syncope 05/10/2016  . Humeral head fracture, right, closed, initial encounter   . Burning tongue 03/25/2016  . Post concussion syndrome 03/03/2016  . Fall 03/03/2016  . Cystocele 03/05/2015  . Dehydration 02/08/2015  . Hyponatremia 02/08/2015  . Well adult exam 10/01/2014  . Grief at loss of child 12/11/2013  . Irregular heart beat 09/29/2013  . Humeral head  fracture 08/26/2013  . Actinic keratoses 08/26/2013  . Carotid bruit 05/02/2012  . Anxiety 03/17/2011  . ANEMIA, PERNICIOUS, HX OF 10/10/2007  . Peripheral neuropathy ([place]) 09/04/2007  . Chronic fatigue 08/06/2007  . Chronic lymphocytic leukemia ([place]) 06/17/2007  . ALLERGIC RHINITIS 06/17/2007  . OSTEOARTHRITIS 06/17/2007  . B12 deficiency 06/07/2007  . GASTROESOPHAGEAL REFLUX DISEASE 04/30/2007  . Dyslipidemia 12/19/2006  . Essential hypertension 12/19/2006  . DIVERTICULOSIS, COLON 05/19/2003  . COLONIC POLYPS, HX OF 01/10/2000    Current Outpatient Prescriptions on File Prior to Visit  Medication Sig Dispense Refill  . acetaminophen (TYLENOL) 500 MG tablet Take 500 mg by mouth 2 (two) times daily.     Marland Kitchen amLODipine (NORVASC) 2.5 MG tablet Take 1 tablet (2.5 mg total) by mouth daily. (Patient taking differently: Take 2.5 mg by mouth daily. Takes 1/2 tablet daily) 90 tablet 3  . Ascorbic Acid (VITAMIN C PO) Take 1 tablet by mouth daily.    Marland Kitchen aspirin 81 MG EC tablet Take 81 mg by mouth daily.      . B-D 3CC LUER-LOK SYR 25GX1" 25G X 1" 3 ML MISC USE TO ADMINISTER B12 INJECTIONS 10 each 2  . Bioflavonoid Products (GRAPE SEED PO) Take 1 tablet by mouth daily.    . Cholecalciferol (VITAMIN D3) 2000 units capsule Take 1 capsule (2,000 Units total) by mouth daily. 100 capsule 3  . conjugated estrogens (PREMARIN) vaginal cream 1/2 gram vaginally every night x 1 week,  then twice weekly at bedtime 90 g 3  . cromolyn (OPTICROM) 4 % ophthalmic solution 1 DROP IN BOTH EYES FOUR TIMES A DAY  12  . cyanocobalamin (,VITAMIN B-12,) 1000 MCG/ML injection ADMINISTER 1 CC SUBQ EVERY 4 WEEKS AS DIRECTED 10 mL 0  . fluticasone (FLONASE) 50 MCG/ACT nasal spray Place 2 sprays into both nostrils daily. 48 g 0  . folic acid (FOLVITE) 1 MG tablet Take 1 mg by mouth daily. For restless legs    . gabapentin (NEURONTIN) 300 MG capsule TAKE 1 CAPSULE (300 MG TOTAL) BY MOUTH DAILY. 90 capsule 1  . GINSENG PO Take 1  tablet by mouth daily.    . hydrochlorothiazide (MICROZIDE) 12.5 MG capsule TAKE ONE CAPSULE BY MOUTH EVERY DAY FOR BLOOD PRESSURE 90 capsule 1  . Insulin Syringe-Needle U-100 (B-D INS SYR ULTRAFINE 1CC/30G) 30G X 1/2" 1 ML MISC USE TO ADMINISTER B12 INJECTIONS 10 each 1  . Insulin Syringe-Needle U-100 (B-D INS SYRINGE 0.5CC/30GX1/2") 30G X 1/2" 0.5 ML MISC by Does not apply route. To administer B12 injections     . latanoprost (XALATAN) 0.005 % ophthalmic solution Place 1 drop into both eyes at bedtime. 2.5 mL 3  . loratadine (CLARITIN) 10 MG tablet TAKE 1 TABLET (10 MG TOTAL) BY MOUTH DAILY. 90 tablet 3  . MAGNESIUM CHLORIDE PO Take 1 tablet by mouth daily.    . Multiple Vitamin (MULTI-DAY PO) Take 1 tablet by mouth daily.    . Multiple Vitamins-Minerals (CENTRUM SILVER 50+WOMEN) TABS Take by mouth.    . nystatin (MYCOSTATIN) 100000 UNIT/ML suspension Take 5 mLs (500,000 Units total) by mouth 3 (three) times daily. Swish and swallow 120 mL 0  . omeprazole (PRILOSEC) 40 MG capsule Take 1 capsule (40 mg total) by mouth 2 (two) times daily. 60 capsule 11  . Phenazopyrid-Cranbry-C-Probiot (AZO URINARY TRACT SUPPORT PO) Take by mouth.    . potassium chloride (KLOR-CON M10) 10 MEQ tablet Take 1 tablet (10 mEq total) by mouth daily. 90 tablet 1  . ranitidine (ZANTAC) 150 MG tablet Take 1 tablet (150 mg total) by mouth at bedtime. 30 tablet 11  . simvastatin (ZOCOR) 10 MG tablet Take 1 tablet (10 mg total) by mouth at bedtime. 90 tablet 3  . vitamin E 400 UNIT capsule Take 400 Units by mouth daily.     No current facility-administered medications on file prior to visit.     Past Medical History:  Diagnosis Date  . Allergy    rhinitis  . Chronic ethmoidal sinusitis   . Diverticulosis of colon   . Glaucoma   . Hx of colonic polyps   . Hyperlipidemia   . Hypertension   . Hypogammaglobulinemia ([place])    Monthly IVIG  . Leukemia ([place])    Chronic lymphocytic leukemia  . Osteoarthritis   .  Peripheral neuropathy   . Pernicious anemia   . UTI (lower urinary tract infection) 2010  . Vitamin B12 deficiency     Past Surgical History:  Procedure Laterality Date  . ABDOMINAL HYSTERECTOMY  1987  . bilateral cataracts  2007  . bladder tack  1987  . L4-L5 Laminetomy  1999  . TUBAL LIGATION    . tubaligation  1961  . ureter reconstruction  1992    Social History   Social History  . Marital status: Married    Spouse name: N/A  . Number of children: N/A  . Years of education: N/A   Social History Main Topics  . Smoking  status: Never Smoker  . Smokeless tobacco: Never Used  . Alcohol use No  . Drug use: No  . Sexual activity: No   Other Topics Concern  . Not on file   Social History Narrative  . No narrative on file    Family History  Problem Relation Age of Onset  . Jaundice Father   . Stroke Son 25       in NH  . Cancer Mother        uterine  . Diabetes Mother   . Other Sister        brain tumor  . Cancer Sister   . Hypertension Other   . Cancer Other        breast  . Thyroid disease Sister   . Cancer Sister        thyroid cancer    Review of Systems  Constitutional: Negative for chills and fever.  HENT: Positive for sore throat (burning in throat). Negative for trouble swallowing.   Gastrointestinal: Positive for abdominal pain. Negative for nausea.  Genitourinary: Positive for dysuria and frequency. Negative for hematuria.       No odor       Objective:   Vitals:   04/25/17 1009  BP: 136/80  Pulse: 69  Resp: 16  Temp: 98.3 F (36.8 C)  SpO2: 97%   Filed Weights   04/25/17 1009  Weight: 138 lb (62.6 kg)   Body mass index is 24.45 kg/m.  Wt Readings from Last 3 Encounters:  04/25/17 138 lb (62.6 kg)  04/03/17 139 lb 3.2 oz (63.1 kg)  03/06/17 136 lb 4.8 oz (61.8 kg)     Physical Exam  Constitutional: She appears well-developed and well-nourished. No distress.  HENT:  Head: Normocephalic and atraumatic.  Oropharynx with  dry mucous membranes, no obvious white coating on tongue, but time is slight erythema  Abdominal: Soft. She exhibits no distension and no mass. There is tenderness (Mild tenderness across lower abdomen). There is no rebound and no guarding.  Genitourinary:  Genitourinary Comments: No CVA tenderness  Skin: Skin is warm and dry. She is not diaphoretic.          Assessment & Plan:   See Problem List for Assessment and Plan of chronic medical problems.

## 2017-04-25 NOTE — Assessment & Plan Note (Signed)
Has been diagnosed with thrush the past and has been prescribed nystatin-she does not recall if this helps or not. Advised her this may be a yeast infection and see if the nystatin helps She has been on intermittent antibiotics for urinary tract infections, which may be causing thrush Follow-up if no improvement

## 2017-04-26 LAB — URINE CULTURE
MICRO NUMBER:: 81098292
SPECIMEN QUALITY:: ADEQUATE

## 2017-05-01 ENCOUNTER — Ambulatory Visit (HOSPITAL_BASED_OUTPATIENT_CLINIC_OR_DEPARTMENT_OTHER): Payer: Medicare Other

## 2017-05-01 ENCOUNTER — Ambulatory Visit (HOSPITAL_COMMUNITY)
Admission: RE | Admit: 2017-05-01 | Discharge: 2017-05-01 | Disposition: A | Discharge status: Home / Self Care | Payer: Medicare Other | Source: Ambulatory Visit | Attending: Oncology | Admitting: Oncology

## 2017-05-01 ENCOUNTER — Other Ambulatory Visit: Payer: Self-pay | Admitting: *Deleted

## 2017-05-01 VITALS — BP 128/57 | HR 52 | Temp 98.4°F | Resp 18

## 2017-05-01 DIAGNOSIS — C9110 Chronic lymphocytic leukemia of B-cell type not having achieved remission: Secondary | ICD-10-CM

## 2017-05-01 DIAGNOSIS — R911 Solitary pulmonary nodule: Secondary | ICD-10-CM | POA: Insufficient documentation

## 2017-05-01 DIAGNOSIS — C9190 Lymphoid leukemia, unspecified not having achieved remission: Secondary | ICD-10-CM | POA: Diagnosis not present

## 2017-05-01 DIAGNOSIS — D801 Nonfamilial hypogammaglobulinemia: Secondary | ICD-10-CM | POA: Diagnosis not present

## 2017-05-01 DIAGNOSIS — Z23 Encounter for immunization: Secondary | ICD-10-CM | POA: Diagnosis not present

## 2017-05-01 MED ORDER — IMMUNE GLOBULIN (HUMAN) 10 GM/100ML IV SOLN
30.0000 g | Freq: Once | INTRAVENOUS | Status: AC
  Administered 2017-05-01: 30 g via INTRAVENOUS
  Filled 2017-05-01: qty 200

## 2017-05-01 MED ORDER — DIPHENHYDRAMINE HCL 25 MG PO CAPS
ORAL_CAPSULE | ORAL | Status: AC
  Filled 2017-05-01: qty 2

## 2017-05-01 MED ORDER — INFLUENZA VAC SPLIT HIGH-DOSE 0.5 ML IM SUSY
0.5000 mL | PREFILLED_SYRINGE | INTRAMUSCULAR | Status: DC
  Filled 2017-05-01: qty 0.5

## 2017-05-01 MED ORDER — DIPHENHYDRAMINE HCL 25 MG PO CAPS
25.0000 mg | ORAL_CAPSULE | Freq: Once | ORAL | Status: AC
Start: 2017-05-01 — End: 2017-05-01
  Administered 2017-05-01: 25 mg via ORAL

## 2017-05-01 MED ORDER — INFLUENZA VAC SPLIT HIGH-DOSE 0.5 ML IM SUSY
0.5000 mL | PREFILLED_SYRINGE | Freq: Once | INTRAMUSCULAR | Status: AC
  Administered 2017-05-01: 0.5 mL via INTRAMUSCULAR
  Filled 2017-05-01: qty 0.5

## 2017-05-01 NOTE — Patient Instructions (Signed)

## 2017-05-07 ENCOUNTER — Other Ambulatory Visit: Payer: Self-pay | Admitting: Internal Medicine

## 2017-05-10 ENCOUNTER — Telehealth: Payer: Self-pay | Admitting: Internal Medicine

## 2017-05-10 MED ORDER — CLOTRIMAZOLE 10 MG MT TROC: 10.0000 mg | Freq: Every day | OROMUCOSAL | 0 refills | Status: DC

## 2017-05-10 NOTE — Telephone Encounter (Signed)
Patient states she seen Dr. Quay Burow on her last OV.  States she is having burning in her throat and that Dr. Quay Burow prescribed her nystatin.  States this has not helped her.  States she has previously be prescribed this before and it had not worked but for got about this when it was prescribed by Dr.Burns.  Is requesting Dr.Burns to send another medication to [facility] on [address] in place of this.

## 2017-05-10 NOTE — Telephone Encounter (Signed)
Pls advise on msg below.../lmb 

## 2017-05-10 NOTE — Telephone Encounter (Signed)
Notified pt w/MD response.../lmb 

## 2017-05-10 NOTE — Telephone Encounter (Signed)
She can try another medication (sent)- if this does not help I would recommend she consider seeing an ENT.

## 2017-05-12 ENCOUNTER — Encounter (HOSPITAL_COMMUNITY): Payer: Self-pay | Admitting: Emergency Medicine

## 2017-05-12 ENCOUNTER — Emergency Department (HOSPITAL_COMMUNITY)
Admission: EM | Admit: 2017-05-12 | Discharge: 2017-05-12 | Disposition: A | Discharge status: Home / Self Care | Payer: Medicare Other | Attending: Emergency Medicine | Admitting: Emergency Medicine

## 2017-05-12 DIAGNOSIS — Z79899 Other long term (current) drug therapy: Secondary | ICD-10-CM | POA: Insufficient documentation

## 2017-05-12 DIAGNOSIS — Z7982 Long term (current) use of aspirin: Secondary | ICD-10-CM | POA: Insufficient documentation

## 2017-05-12 DIAGNOSIS — H5712 Ocular pain, left eye: Secondary | ICD-10-CM | POA: Diagnosis not present

## 2017-05-12 DIAGNOSIS — I10 Essential (primary) hypertension: Secondary | ICD-10-CM | POA: Insufficient documentation

## 2017-05-12 MED ORDER — FLUORESCEIN SODIUM 1 MG OP STRP
1.0000 | ORAL_STRIP | Freq: Once | OPHTHALMIC | Status: AC
  Administered 2017-05-12: 1 via OPHTHALMIC
  Filled 2017-05-12: qty 1

## 2017-05-12 MED ORDER — ERYTHROMYCIN 2 % EX OINT: TOPICAL_OINTMENT | CUTANEOUS | 0 refills | Status: DC

## 2017-05-12 MED ORDER — TETRACAINE HCL 0.5 % OP SOLN
2.0000 [drp] | Freq: Once | OPHTHALMIC | Status: AC
  Administered 2017-05-12: 2 [drp] via OPHTHALMIC
  Filled 2017-05-12: qty 4

## 2017-05-12 NOTE — Discharge Instructions (Signed)
Try using artificial/lubricating tears to your eyes the next few days. Follow-up with your eye doctor in next week to be rechecked. Return to the emergency room for headache, slurred speech acute vision changes or other neurological problems

## 2017-05-12 NOTE — ED Notes (Signed)
I have just completed irrigation of O.D. With 500cc NSS per order of Dr. Tomi Bamberger with [facility] lens.

## 2017-05-12 NOTE — ED Provider Notes (Signed)
[facility] DEPT Provider Note   CSN: [id number] Arrival date & time: 05/12/17  1101     History   Chief Complaint Chief Complaint  Patient presents with  . Eye Pain    HPI [name] is a 83 y.o. female.  HPI The patient presents to the emergency room for evaluation of left eye pain. Patient has a history of remote eye surgery that sounds like cataract removal and lens implant.  Patient states this was many years ago and has been fine since then. However, this morning when she woke up she was having pain in her left thigh. She feels like there is something stuck underneath her left eyelid.  Patient does not recall doing anything where she could've gotten something under her eye. She denies any other complaints of headache or visual issues. No fevers or chills. No drainage. Past Medical History:  Diagnosis Date  . Allergy    rhinitis  . Chronic ethmoidal sinusitis   . Diverticulosis of colon   . Glaucoma   . Hx of colonic polyps   . Hyperlipidemia   . Hypertension   . Hypogammaglobulinemia ([place])    Monthly IVIG  . Leukemia ([place])    Chronic lymphocytic leukemia  . Osteoarthritis   . Peripheral neuropathy   . Pernicious anemia   . UTI (lower urinary tract infection) 2010  . Vitamin B12 deficiency     Patient Active Problem List   Diagnosis Date Noted  . Thrush 02/10/2017  . Frequent urination 02/10/2017  . Osteoporosis 11/23/2016  . Abdominal pain 08/16/2016  . Urinary frequency 08/16/2016  . Peripheral edema 08/16/2016  . General weakness 08/16/2016  . Gait disorder 08/16/2016  . Dysuria 08/03/2016  . Syncope and collapse 05/10/2016  . Closed displaced simple supracondylar fracture of right humerus without intercondylar fracture 05/10/2016  . Urinary tract infection without hematuria 05/10/2016  . Syncope 05/10/2016  . Humeral head fracture, right, closed, initial encounter   . Burning tongue 03/25/2016  . Post concussion  syndrome 03/03/2016  . Fall 03/03/2016  . Cystocele 03/05/2015  . Dehydration 02/08/2015  . Hyponatremia 02/08/2015  . Well adult exam 10/01/2014  . Grief at loss of child 12/11/2013  . Irregular heart beat 09/29/2013  . Humeral head fracture 08/26/2013  . Actinic keratoses 08/26/2013  . Carotid bruit 05/02/2012  . Anxiety 03/17/2011  . ANEMIA, PERNICIOUS, HX OF 10/10/2007  . Peripheral neuropathy ([address]) 09/04/2007  . Chronic fatigue 08/06/2007  . Chronic lymphocytic leukemia ([place]) 06/17/2007  . ALLERGIC RHINITIS 06/17/2007  . OSTEOARTHRITIS 06/17/2007  . B12 deficiency 06/07/2007  . GASTROESOPHAGEAL REFLUX DISEASE 04/30/2007  . Dyslipidemia 12/19/2006  . Essential hypertension 12/19/2006  . DIVERTICULOSIS, COLON 05/19/2003  . COLONIC POLYPS, HX OF 01/10/2000    Past Surgical History:  Procedure Laterality Date  . ABDOMINAL HYSTERECTOMY  1987  . bilateral cataracts  2007  . bladder tack  1987  . L4-L5 Laminetomy  1999  . TUBAL LIGATION    . tubaligation  1961  . ureter reconstruction  1992    OB History    Gravida Para Term Preterm AB Living   4 4       4    SAB TAB Ectopic Multiple Live Births                   Home Medications    Prior to Admission medications   Medication Sig Start Date End Date Taking? Authorizing Provider  acetaminophen (TYLENOL) 500 MG tablet  Take 500 mg by mouth 2 (two) times daily.    Yes [provider]  amLODipine (NORVASC) 2.5 MG tablet Take 1 tablet (2.5 mg total) by mouth daily. Patient taking differently: Take 2.5 mg by mouth daily. Takes 1/2 tablet daily 02/27/17  Yes Plotnikov, Evie Lacks, MD  Ascorbic Acid (VITAMIN C PO) Take 1 tablet by mouth daily.   Yes [provider]  aspirin 81 MG EC tablet Take 81 mg by mouth daily.     Yes [provider]  Bioflavonoid Products (GRAPE SEED PO) Take 1 tablet by mouth daily.   Yes [provider]  Cholecalciferol (VITAMIN D3) 2000 units capsule Take 1  capsule (2,000 Units total) by mouth daily. 11/23/16  Yes Plotnikov, Evie Lacks, MD  clotrimazole (MYCELEX) 10 MG troche Take 1 tablet (10 mg total) by mouth 5 (five) times daily. 05/10/17  Yes Binnie Rail, MD  conjugated estrogens (PREMARIN) vaginal cream 1/2 gram vaginally every night x 1 week, then twice weekly at bedtime 12/29/15  Yes Salvadore Dom, MD  cromolyn (OPTICROM) 4 % ophthalmic solution 1 DROP IN BOTH EYES FOUR TIMES A DAY 03/21/16  Yes [provider]  cyanocobalamin (,VITAMIN B-12,) 1000 MCG/ML injection ADMINISTER 1 CC SUBQ EVERY 4 WEEKS AS DIRECTED 08/07/16  Yes Plotnikov, Evie Lacks, MD  DPH-Lido-AlHydr-MgHydr-Simeth (FIRST-MOUTHWASH BLM) SUSP Use as directed 5 mLs in the mouth or throat 3 (three) times daily.   Yes [provider]  fluticasone (FLONASE) 50 MCG/ACT nasal spray Place 2 sprays into both nostrils daily. 11/21/16  Yes Plotnikov, Evie Lacks, MD  folic acid (FOLVITE) 1 MG tablet Take 1 mg by mouth daily. For restless legs   Yes [provider]  gabapentin (NEURONTIN) 300 MG capsule TAKE 1 CAPSULE (300 MG TOTAL) BY MOUTH DAILY. 03/12/17  Yes Plotnikov, Evie Lacks, MD  GINSENG PO Take 1 tablet by mouth daily.   Yes [provider]  hydrochlorothiazide (MICROZIDE) 12.5 MG capsule TAKE ONE CAPSULE BY MOUTH EVERY DAY FOR BLOOD PRESSURE 01/04/17  Yes Plotnikov, Evie Lacks, MD  KLOR-CON M10 10 MEQ tablet TAKE 1 TABLET (10 MEQ TOTAL) BY MOUTH DAILY. 05/07/17  Yes Plotnikov, Evie Lacks, MD  latanoprost (XALATAN) 0.005 % ophthalmic solution Place 1 drop into both eyes at bedtime. 03/27/17  Yes Plotnikov, Evie Lacks, MD  loratadine (CLARITIN) 10 MG tablet TAKE 1 TABLET (10 MG TOTAL) BY MOUTH DAILY. 06/02/16  Yes Plotnikov, Evie Lacks, MD  MAGNESIUM CHLORIDE PO Take 1 tablet by mouth daily.   Yes [provider]  Multiple Vitamins-Minerals (CENTRUM SILVER 50+WOMEN) TABS Take by mouth.   Yes [provider]  omeprazole (PRILOSEC) 40 MG  capsule Take 1 capsule (40 mg total) by mouth 2 (two) times daily. 12/06/16  Yes Plotnikov, Evie Lacks, MD  Phenazopyrid-Cranbry-C-Probiot (AZO URINARY TRACT SUPPORT PO) Take 2 tablets by mouth daily.    Yes [provider]  ranitidine (ZANTAC) 150 MG tablet Take 1 tablet (150 mg total) by mouth at bedtime. 12/06/16  Yes Plotnikov, Evie Lacks, MD  simvastatin (ZOCOR) 10 MG tablet Take 1 tablet (10 mg total) by mouth at bedtime. 02/27/17  Yes Plotnikov, Evie Lacks, MD  vitamin E 400 UNIT capsule Take 400 Units by mouth daily.   Yes [provider]  B-D 3CC LUER-LOK SYR 25GX1" 25G X 1" 3 ML MISC USE TO ADMINISTER B12 INJECTIONS 09/08/13   Plotnikov, Evie Lacks, MD  Insulin Syringe-Needle U-100 (B-D INS SYR ULTRAFINE 1CC/30G) 30G X 1/2" 1  ML MISC USE TO ADMINISTER B12 INJECTIONS 08/29/16   Plotnikov, Evie Lacks, MD  Insulin Syringe-Needle U-100 (B-D INS SYRINGE 0.5CC/30GX1/2") 30G X 1/2" 0.5 ML MISC by Does not apply route. To administer B12 injections     [provider]  nystatin (MYCOSTATIN) 100000 UNIT/ML suspension Take 5 mLs (500,000 Units total) by mouth 3 (three) times daily. Swish and swallow.  Use for 10 days. Patient not taking: Reported on 05/12/2017 04/25/17   Binnie Rail, MD    Family History Family History  Problem Relation Age of Onset  . Jaundice Father   . Stroke Son 74       in NH  . Cancer Mother        uterine  . Diabetes Mother   . Other Sister        brain tumor  . Cancer Sister   . Hypertension Other   . Cancer Other        breast  . Thyroid disease Sister   . Cancer Sister        thyroid cancer    Social History Social History  Substance Use Topics  . Smoking status: Never Smoker  . Smokeless tobacco: Never Used  . Alcohol use No     Allergies   Pneumococcal vaccines; Clarithromycin; Hctz [hydrochlorothiazide]; Oxycodone-aspirin; and Propoxyphene n-acetaminophen   Review of Systems Review of Systems  All other systems reviewed and  are negative.    Physical Exam Updated Vital Signs BP (!) 171/82 (BP Location: Right Arm)   Pulse (!) 57   Temp 98.1 F (36.7 C) (Oral)   Resp 16   SpO2 99%   Physical Exam  Constitutional: She appears well-developed and well-nourished. No distress.  HENT:  Head: Normocephalic and atraumatic.  Right Ear: External ear normal.  Left Ear: External ear normal.  No temporal artery tenderness  Eyes: Pupils are equal, round, and reactive to light. Conjunctivae, EOM and lids are normal. Right eye exhibits no discharge. Left eye exhibits no discharge. Right conjunctiva is not injected. Left conjunctiva is not injected. No scleral icterus. Right eye exhibits normal extraocular motion. Left eye exhibits normal extraocular motion.  Slit lamp exam:      The right eye shows no corneal flare, no corneal ulcer and no foreign body.       The left eye shows no corneal abrasion, no corneal flare, no corneal ulcer, no foreign body and no fluorescein uptake.  Attempted lid eversion left eye, unable to evert the upper lid, no foreign body noted.   Left eye pressure with tonopen 15  Neck: Neck supple. No tracheal deviation present.  Cardiovascular: Normal rate.   Pulmonary/Chest: Effort normal. No stridor. No respiratory distress.  Abdominal: She exhibits no distension.  Musculoskeletal: She exhibits no edema.  Neurological: She is alert. Cranial nerve deficit: no gross deficits.  Skin: Skin is warm and dry. No rash noted.  Psychiatric: She has a normal mood and affect.  Nursing note and vitals reviewed.    ED Treatments / Results   Procedures Procedures (including critical care time) Tetracain applied to eye.  Sx improved Eye irritation by nursing staff Medications Ordered in ED Medications  fluorescein ophthalmic strip 1 strip (1 strip Left Eye Given by Other 05/12/17 1314)  tetracaine (PONTOCAINE) 0.5 % ophthalmic solution 2 drop (2 drops Left Eye Given 05/12/17 1314)     Initial  Impression / Assessment and Plan / ED Course  I have reviewed the triage vital signs and the  nursing notes.  Pertinent labs & imaging results that were available during my care of the patient were reviewed by me and considered in my medical decision making (see chart for details).    Patient presented to the emergency room with left eye pain. Acute vision changes. No headache.  Patient pointed to an area underneath her left eyelid. No obvious foreign body but it sounds like she has some sort of irritation. The patient's symptoms improved with tetracaine suggesting corneal irritation. She was copiously irrigated with a Morgan lens. Otherwise appears stable for discharge. Use artificial tears. Follow up with her primary care doctor.  Final Clinical Impressions(s) / ED Diagnoses   Final diagnoses:  Left eye pain    New Prescriptions New Prescriptions   No medications on file     Dorie Rank, MD 05/12/17 1418

## 2017-05-12 NOTE — ED Triage Notes (Signed)
Patient c/o left eye pain when she woke up this morning. Denies any drainage. No redness or swelling noted.

## 2017-05-14 NOTE — Progress Notes (Signed)
Subjective:    Patient ID: [name], female    DOB: 20-Jul-1926, 83 y.o.   MRN: [id number]  HPI She is here for an acute visit.    Burning in mouth/throat:  For over one year her tongue and throat have burned.  She has been treated for thrush more than once and it has not helped.  She has not had pain or difficulty swallowing.  She describes the sensation as a burning sensation in her throat and her tongue.  More recently she has had increased nausea.  She feels this may be coming from her stomach.  She does not have any symptoms in the morning.  Symptoms start at some point in the morning and persist all day. Eating does not make it worse.  Nothing makes it worse or better.  She is taking omeprazole twice daily and zantac daily.    She denies any fevers, changes in her appetite, sore throat, difficulty swallowing, cough or abdominal pain.  In 2008 she had an EGD for burning in the conclusion was possible esophageal candidiasis.  She was placed on Diflucan orally at that time.  She does not recall this and those notes are not able to be reviewed.  Her current symptoms started just over 1 year ago after taking 2-3 antibiotics for urinary tract infection and sinus infection.   Medications and allergies reviewed with patient and updated if appropriate.  Patient Active Problem List   Diagnosis Date Noted  . Thrush 02/10/2017  . Frequent urination 02/10/2017  . Osteoporosis 11/23/2016  . Abdominal pain 08/16/2016  . Urinary frequency 08/16/2016  . Peripheral edema 08/16/2016  . General weakness 08/16/2016  . Gait disorder 08/16/2016  . Dysuria 08/03/2016  . Syncope and collapse 05/10/2016  . Closed displaced simple supracondylar fracture of right humerus without intercondylar fracture 05/10/2016  . Urinary tract infection without hematuria 05/10/2016  . Syncope 05/10/2016  . Humeral head fracture, right, closed, initial encounter   . Burning tongue 03/25/2016  . Post concussion  syndrome 03/03/2016  . Fall 03/03/2016  . Cystocele 03/05/2015  . Dehydration 02/08/2015  . Hyponatremia 02/08/2015  . Well adult exam 10/01/2014  . Grief at loss of child 12/11/2013  . Irregular heart beat 09/29/2013  . Humeral head fracture 08/26/2013  . Actinic keratoses 08/26/2013  . Carotid bruit 05/02/2012  . Anxiety 03/17/2011  . ANEMIA, PERNICIOUS, HX OF 10/10/2007  . Peripheral neuropathy ([place]) 09/04/2007  . Chronic fatigue 08/06/2007  . Chronic lymphocytic leukemia ([place]) 06/17/2007  . ALLERGIC RHINITIS 06/17/2007  . OSTEOARTHRITIS 06/17/2007  . B12 deficiency 06/07/2007  . GASTROESOPHAGEAL REFLUX DISEASE 04/30/2007  . Dyslipidemia 12/19/2006  . Essential hypertension 12/19/2006  . DIVERTICULOSIS, COLON 05/19/2003  . COLONIC POLYPS, HX OF 01/10/2000    Current Outpatient Prescriptions on File Prior to Visit  Medication Sig Dispense Refill  . acetaminophen (TYLENOL) 500 MG tablet Take 500 mg by mouth 2 (two) times daily.     Marland Kitchen amLODipine (NORVASC) 2.5 MG tablet Take 1 tablet (2.5 mg total) by mouth daily. (Patient taking differently: Take 2.5 mg by mouth daily. Takes 1/2 tablet daily) 90 tablet 3  . Ascorbic Acid (VITAMIN C PO) Take 1 tablet by mouth daily.    Marland Kitchen aspirin 81 MG EC tablet Take 81 mg by mouth daily.      . B-D 3CC LUER-LOK SYR 25GX1" 25G X 1" 3 ML MISC USE TO ADMINISTER B12 INJECTIONS 10 each 2  . Bioflavonoid Products (GRAPE SEED PO)  Take 1 tablet by mouth daily.    . Cholecalciferol (VITAMIN D3) 2000 units capsule Take 1 capsule (2,000 Units total) by mouth daily. 100 capsule 3  . clotrimazole (MYCELEX) 10 MG troche Take 1 tablet (10 mg total) by mouth 5 (five) times daily. 50 tablet 0  . conjugated estrogens (PREMARIN) vaginal cream 1/2 gram vaginally every night x 1 week, then twice weekly at bedtime 90 g 3  . cromolyn (OPTICROM) 4 % ophthalmic solution 1 DROP IN BOTH EYES FOUR TIMES A DAY  12  . cyanocobalamin (,VITAMIN B-12,) 1000 MCG/ML injection  ADMINISTER 1 CC SUBQ EVERY 4 WEEKS AS DIRECTED 10 mL 0  . Erythromycin 2 % ointment Apply to affected area 2 times daily 25 g 0  . fluticasone (FLONASE) 50 MCG/ACT nasal spray Place 2 sprays into both nostrils daily. 48 g 0  . folic acid (FOLVITE) 1 MG tablet Take 1 mg by mouth daily. For restless legs    . gabapentin (NEURONTIN) 300 MG capsule TAKE 1 CAPSULE (300 MG TOTAL) BY MOUTH DAILY. 90 capsule 1  . GINSENG PO Take 1 tablet by mouth daily.    . hydrochlorothiazide (MICROZIDE) 12.5 MG capsule TAKE ONE CAPSULE BY MOUTH EVERY DAY FOR BLOOD PRESSURE 90 capsule 1  . Insulin Syringe-Needle U-100 (B-D INS SYR ULTRAFINE 1CC/30G) 30G X 1/2" 1 ML MISC USE TO ADMINISTER B12 INJECTIONS 10 each 1  . Insulin Syringe-Needle U-100 (B-D INS SYRINGE 0.5CC/30GX1/2") 30G X 1/2" 0.5 ML MISC by Does not apply route. To administer B12 injections     . KLOR-CON M10 10 MEQ tablet TAKE 1 TABLET (10 MEQ TOTAL) BY MOUTH DAILY. 90 tablet 1  . latanoprost (XALATAN) 0.005 % ophthalmic solution Place 1 drop into both eyes at bedtime. 2.5 mL 3  . loratadine (CLARITIN) 10 MG tablet TAKE 1 TABLET (10 MG TOTAL) BY MOUTH DAILY. 90 tablet 3  . MAGNESIUM CHLORIDE PO Take 1 tablet by mouth daily.    . Multiple Vitamins-Minerals (CENTRUM SILVER 50+WOMEN) TABS Take by mouth.    Marland Kitchen omeprazole (PRILOSEC) 40 MG capsule Take 1 capsule (40 mg total) by mouth 2 (two) times daily. 60 capsule 11  . Phenazopyrid-Cranbry-C-Probiot (AZO URINARY TRACT SUPPORT PO) Take 2 tablets by mouth daily.     . ranitidine (ZANTAC) 150 MG tablet Take 1 tablet (150 mg total) by mouth at bedtime. 30 tablet 11  . simvastatin (ZOCOR) 10 MG tablet Take 1 tablet (10 mg total) by mouth at bedtime. 90 tablet 3  . vitamin E 400 UNIT capsule Take 400 Units by mouth daily.     No current facility-administered medications on file prior to visit.     Past Medical History:  Diagnosis Date  . Allergy    rhinitis  . Chronic ethmoidal sinusitis   . Diverticulosis of  colon   . Glaucoma   . Hx of colonic polyps   . Hyperlipidemia   . Hypertension   . Hypogammaglobulinemia ([place])    Monthly IVIG  . Leukemia ([place])    Chronic lymphocytic leukemia  . Osteoarthritis   . Peripheral neuropathy   . Pernicious anemia   . UTI (lower urinary tract infection) 2010  . Vitamin B12 deficiency     Past Surgical History:  Procedure Laterality Date  . ABDOMINAL HYSTERECTOMY  1987  . bilateral cataracts  2007  . bladder tack  1987  . L4-L5 Laminetomy  1999  . TUBAL LIGATION    . tubaligation  1961  . ureter  reconstruction  70    Social History   Social History  . Marital status: Married    Spouse name: N/A  . Number of children: N/A  . Years of education: N/A   Social History Main Topics  . Smoking status: Never Smoker  . Smokeless tobacco: Never Used  . Alcohol use No  . Drug use: No  . Sexual activity: No   Other Topics Concern  . None   Social History Narrative  . None    Family History  Problem Relation Age of Onset  . Jaundice Father   . Stroke Son 55       in NH  . Cancer Mother        uterine  . Diabetes Mother   . Other Sister        brain tumor  . Cancer Sister   . Hypertension Other   . Cancer Other        breast  . Thyroid disease Sister   . Cancer Sister        thyroid cancer    Review of Systems  Constitutional: Negative for appetite change and fever.  HENT: Negative for sore throat and trouble swallowing.   Respiratory: Negative for cough.   Gastrointestinal: Positive for nausea. Negative for abdominal pain.       Objective:   Vitals:   05/15/17 1049  BP: 138/76  Pulse: 66  Resp: 16  Temp: 97.9 F (36.6 C)  SpO2: 97%   Filed Weights   05/15/17 1049  Weight: 136 lb (61.7 kg)   Body mass index is 24.09 kg/m.  Wt Readings from Last 3 Encounters:  05/15/17 136 lb (61.7 kg)  04/25/17 138 lb (62.6 kg)  04/03/17 139 lb 3.2 oz (63.1 kg)     Physical Exam Constitutional: Appears well-developed  and well-nourished. No distress.  HENT:  Head: Normocephalic and atraumatic.  Neck: Neck supple. No tracheal deviation present. No thyromegaly present.  No cervical lymphadenopathy, oropharynx with dry mucous membranes, no exudate, coating on tongue or erythema Cardiovascular: Normal rate, regular rhythm and normal heart sounds.   Pulmonary/Chest: Effort normal and breath sounds normal. No respiratory distress. No has no wheezes. No rales.  Abdomen: Soft, nontender, nondistended Skin: Skin is warm and dry. Not diaphoretic.         Assessment & Plan:   See Problem List for Assessment and Plan of chronic medical problems.

## 2017-05-15 ENCOUNTER — Ambulatory Visit (INDEPENDENT_AMBULATORY_CARE_PROVIDER_SITE_OTHER): Payer: Medicare Other | Admitting: Internal Medicine

## 2017-05-15 ENCOUNTER — Encounter: Payer: Self-pay | Admitting: Internal Medicine

## 2017-05-15 VITALS — BP 138/76 | HR 66 | Temp 97.9°F | Resp 16 | Wt 136.0 lb

## 2017-05-15 DIAGNOSIS — K146 Glossodynia: Secondary | ICD-10-CM

## 2017-05-15 DIAGNOSIS — K219 Gastro-esophageal reflux disease without esophagitis: Secondary | ICD-10-CM

## 2017-05-15 MED ORDER — PANTOPRAZOLE SODIUM 40 MG PO TBEC: 40.0000 mg | DELAYED_RELEASE_TABLET | Freq: Two times a day (BID) | ORAL | 5 refills | Status: DC

## 2017-05-15 MED ORDER — FLUCONAZOLE 100 MG PO TABS: 100.0000 mg | ORAL_TABLET | Freq: Every day | ORAL | 0 refills | Status: DC

## 2017-05-15 NOTE — Patient Instructions (Addendum)
Take fluconazole daily for 10 days.  Hold the simvastatin while on this medication, then restart it.  Stop the omeprazole and start pantoprazole twice a day.  This is a stronger medication for the stomach.   Continue all your other medications.   If there is no improvement call and we can refer you for further evaluation.

## 2017-05-15 NOTE — Assessment & Plan Note (Signed)
Burning tongue and mouth sensation Has tried nystatin more than once and clotrimazole troches without improvement, in addition to other treatments 10 years ago she did have esophageal candidiasis and was on oral fluconazole and her's current symptoms started just over a year ago after being on 2-3 antibiotics Will empirically treat with fluconazole 100 mg daily for 10 days.  She will hold her simvastatin while on this medication Advised if there is no improvement we can consider a referral to ENT

## 2017-05-15 NOTE — Assessment & Plan Note (Signed)
?    Controlled She states she is compliant with a GERD diet Discontinue omeprazole and start pantoprazole 40 mg twice daily Continue Zantac

## 2017-05-24 ENCOUNTER — Telehealth: Payer: Self-pay | Admitting: Internal Medicine

## 2017-05-24 ENCOUNTER — Other Ambulatory Visit: Payer: Self-pay | Admitting: Internal Medicine

## 2017-05-24 DIAGNOSIS — R208 Other disturbances of skin sensation: Secondary | ICD-10-CM

## 2017-05-24 NOTE — Telephone Encounter (Signed)
Patient is requesting that the ENT referral be done. She would also like to make sure the appointment is done for a Tuesday so she can ride the bus.

## 2017-05-24 NOTE — Telephone Encounter (Signed)
I think this is for burning sensation in tongue or mouth.

## 2017-05-24 NOTE — Telephone Encounter (Signed)
ordered

## 2017-05-29 ENCOUNTER — Ambulatory Visit (HOSPITAL_BASED_OUTPATIENT_CLINIC_OR_DEPARTMENT_OTHER): Payer: Medicare Other | Admitting: Oncology

## 2017-05-29 ENCOUNTER — Ambulatory Visit (HOSPITAL_BASED_OUTPATIENT_CLINIC_OR_DEPARTMENT_OTHER): Payer: Medicare Other

## 2017-05-29 ENCOUNTER — Other Ambulatory Visit (HOSPITAL_BASED_OUTPATIENT_CLINIC_OR_DEPARTMENT_OTHER): Payer: Medicare Other

## 2017-05-29 ENCOUNTER — Ambulatory Visit: Payer: Medicare Other | Admitting: Internal Medicine

## 2017-05-29 VITALS — BP 128/72 | HR 62 | Temp 98.0°F | Resp 17 | Ht 63.0 in | Wt 134.0 lb

## 2017-05-29 VITALS — BP 127/63 | HR 62 | Temp 97.8°F | Resp 18

## 2017-05-29 DIAGNOSIS — C9110 Chronic lymphocytic leukemia of B-cell type not having achieved remission: Secondary | ICD-10-CM | POA: Diagnosis not present

## 2017-05-29 DIAGNOSIS — D801 Nonfamilial hypogammaglobulinemia: Secondary | ICD-10-CM | POA: Diagnosis not present

## 2017-05-29 DIAGNOSIS — R911 Solitary pulmonary nodule: Secondary | ICD-10-CM | POA: Diagnosis not present

## 2017-05-29 LAB — CBC WITH DIFFERENTIAL/PLATELET
BASO%: 0.7 % (ref 0.0–2.0)
Basophils Absolute: 0 10*3/uL (ref 0.0–0.1)
EOS%: 2.2 % (ref 0.0–7.0)
Eosinophils Absolute: 0.1 10*3/uL (ref 0.0–0.5)
HCT: 43.6 % (ref 34.8–46.6)
HGB: 14.6 g/dL (ref 11.6–15.9)
LYMPH%: 36.4 % (ref 14.0–49.7)
MCH: 31.4 pg (ref 25.1–34.0)
MCHC: 33.5 g/dL (ref 31.5–36.0)
MCV: 93.7 fL (ref 79.5–101.0)
MONO#: 0.4 10*3/uL (ref 0.1–0.9)
MONO%: 7.9 % (ref 0.0–14.0)
NEUT%: 52.8 % (ref 38.4–76.8)
NEUTROS ABS: 2.9 10*3/uL (ref 1.5–6.5)
Platelets: 154 10*3/uL (ref 145–400)
RBC: 4.65 10*6/uL (ref 3.70–5.45)
RDW: 13.7 % (ref 11.2–14.5)
WBC: 5.5 10*3/uL (ref 3.9–10.3)
lymph#: 2 10*3/uL (ref 0.9–3.3)

## 2017-05-29 MED ORDER — DIPHENHYDRAMINE HCL 25 MG PO CAPS
ORAL_CAPSULE | ORAL | Status: AC
  Filled 2017-05-29: qty 1

## 2017-05-29 MED ORDER — IMMUNE GLOBULIN (HUMAN) 10 GM/100ML IV SOLN
30.0000 g | Freq: Once | INTRAVENOUS | Status: AC
  Administered 2017-05-29: 30 g via INTRAVENOUS
  Filled 2017-05-29: qty 300

## 2017-05-29 MED ORDER — VITAMIN B-12 1000 MCG PO TABS: 1000.0000 ug | ORAL_TABLET | ORAL | Status: DC

## 2017-05-29 MED ORDER — DIPHENHYDRAMINE HCL 25 MG PO CAPS
25.0000 mg | ORAL_CAPSULE | Freq: Once | ORAL | Status: AC
  Administered 2017-05-29: 25 mg via ORAL

## 2017-05-29 MED ORDER — DEXTROSE 5 % IV SOLN
Freq: Once | INTRAVENOUS | Status: AC
  Administered 2017-05-29: 13:00:00 via INTRAVENOUS

## 2017-05-29 NOTE — Progress Notes (Signed)
  [facility] OFFICE PROGRESS NOTE   Diagnosis: CLL  INTERVAL HISTORY:   Ms. Scarpelli returns as scheduled.  She continues monthly IVIG.  No recent infection.  She notes soreness at the upper anterior left chest.  She reports burning of the tongue for the past year.  Objective:  Vital signs in last 24 hours:  Blood pressure 128/72, pulse 62, temperature 98 F (36.7 C), temperature source Oral, resp. rate 17, height 5\' 3"  (1.6 m), weight 134 lb (60.8 kg), SpO2 97 %.    HEENT: No thrush or ulcers. Resp: Lungs clear bilaterally Cardio: Regular rate and rhythm GI: No hepatosplenomegaly Vascular: No leg edema Musculoskeletal: Mild tenderness at the upper anterior left chest wall.  No mass or rash  Lab Results:  Lab Results  Component Value Date   WBC 5.5 05/29/2017   HGB 14.6 05/29/2017   HCT 43.6 05/29/2017   MCV 93.7 05/29/2017   PLT 154 05/29/2017   NEUTROABS 2.9 05/29/2017    Medications: I have reviewed the patient's current medications.  Assessment/Plan: 1. Chronic lymphocytic leukemia, asymptomatic. 2. History of hypogammaglobulinemia with recurrent infections. She continues monthly IVIG replacement therapy. 3. History of recurrent urinary tract infections. 4. History of mild thrombocytopenia secondary to CLL. 5. Question right upper lobe nodule on chest x-ray 05/09/2016; follow-up chest x-ray 09/19/2016 with more confluent irregular nodular density right upper lobe measuring 3 x 1.8 cm. repeat chest x-ray 01/09/2017-regression of irregular nodular density in the right upper lobe felt to represent interval scarring.  Stable on chest x-ray 05/01/2017    Disposition:  Ms. Krone appears stable.  The plan is to continue monthly IVIG.  We discussed the right upper chest density that has been noted on multiple x-rays.  I discussed the radiology recommendation for a chest CT.  She does not wish to pursue a CT at  present.  Ms. Earnhardt will return for an office visit in 3 months. Donneta Romberg, MD  05/29/2017  11:29 AM

## 2017-05-29 NOTE — Patient Instructions (Signed)

## 2017-06-05 ENCOUNTER — Ambulatory Visit: Payer: Medicare Other | Admitting: Internal Medicine

## 2017-06-11 ENCOUNTER — Other Ambulatory Visit: Payer: Self-pay | Admitting: Internal Medicine

## 2017-06-12 ENCOUNTER — Other Ambulatory Visit (INDEPENDENT_AMBULATORY_CARE_PROVIDER_SITE_OTHER): Payer: Medicare Other

## 2017-06-12 ENCOUNTER — Encounter: Payer: Self-pay | Admitting: Internal Medicine

## 2017-06-12 ENCOUNTER — Ambulatory Visit (INDEPENDENT_AMBULATORY_CARE_PROVIDER_SITE_OTHER): Payer: Medicare Other | Admitting: Internal Medicine

## 2017-06-12 ENCOUNTER — Telehealth: Payer: Self-pay | Admitting: Oncology

## 2017-06-12 DIAGNOSIS — R350 Frequency of micturition: Secondary | ICD-10-CM

## 2017-06-12 DIAGNOSIS — R531 Weakness: Secondary | ICD-10-CM

## 2017-06-12 DIAGNOSIS — N390 Urinary tract infection, site not specified: Secondary | ICD-10-CM

## 2017-06-12 DIAGNOSIS — R300 Dysuria: Secondary | ICD-10-CM | POA: Diagnosis not present

## 2017-06-12 DIAGNOSIS — I10 Essential (primary) hypertension: Secondary | ICD-10-CM

## 2017-06-12 DIAGNOSIS — R269 Unspecified abnormalities of gait and mobility: Secondary | ICD-10-CM

## 2017-06-12 DIAGNOSIS — K146 Glossodynia: Secondary | ICD-10-CM

## 2017-06-12 LAB — CBC WITH DIFFERENTIAL/PLATELET
Basophils Absolute: 0 10*3/uL (ref 0.0–0.1)
Basophils Relative: 0.3 % (ref 0.0–3.0)
EOS PCT: 0.1 % (ref 0.0–5.0)
Eosinophils Absolute: 0 10*3/uL (ref 0.0–0.7)
HCT: 42.5 % (ref 36.0–46.0)
Hemoglobin: 14.6 g/dL (ref 12.0–15.0)
Lymphocytes Relative: 14.3 % (ref 12.0–46.0)
Lymphs Abs: 1.1 10*3/uL (ref 0.7–4.0)
MCHC: 34.4 g/dL (ref 30.0–36.0)
MCV: 93.5 fl (ref 78.0–100.0)
Monocytes Absolute: 0.7 10*3/uL (ref 0.1–1.0)
Monocytes Relative: 9.5 % (ref 3.0–12.0)
NEUTROS ABS: 5.8 10*3/uL (ref 1.4–7.7)
NEUTROS PCT: 75.8 % (ref 43.0–77.0)
PLATELETS: 144 10*3/uL — AB (ref 150.0–400.0)
RBC: 4.54 Mil/uL (ref 3.87–5.11)
RDW: 13.8 % (ref 11.5–15.5)
WBC: 7.7 10*3/uL (ref 4.0–10.5)

## 2017-06-12 LAB — BASIC METABOLIC PANEL
BUN: 21 mg/dL (ref 6–23)
CALCIUM: 9.5 mg/dL (ref 8.4–10.5)
CO2: 28 meq/L (ref 19–32)
CREATININE: 0.81 mg/dL (ref 0.40–1.20)
Chloride: 97 mEq/L (ref 96–112)
GFR: 70.78 mL/min (ref >60.00)
Glucose, Bld: 181 mg/dL — ABNORMAL HIGH (ref 70–99)
Potassium: 4 mEq/L (ref 3.5–5.1)
Sodium: 132 mEq/L — ABNORMAL LOW (ref 135–145)

## 2017-06-12 LAB — URINALYSIS, ROUTINE W REFLEX MICROSCOPIC
Bilirubin Urine: NEGATIVE
KETONES UR: NEGATIVE
NITRITE: NEGATIVE
Specific Gravity, Urine: 1.02 (ref 1.000–1.030)
Total Protein, Urine: 100 — AB
URINE GLUCOSE: NEGATIVE
Urobilinogen, UA: 0.2 (ref 0.0–1.0)
pH: 6 (ref 5.0–8.0)

## 2017-06-12 MED ORDER — PHENAZOPYRIDINE HCL 100 MG PO TABS: 100.0000 mg | ORAL_TABLET | Freq: Three times a day (TID) | ORAL | 1 refills | Status: DC | PRN

## 2017-06-12 MED ORDER — CEFUROXIME AXETIL 250 MG PO TABS: 250.0000 mg | ORAL_TABLET | Freq: Two times a day (BID) | ORAL | 0 refills | Status: AC

## 2017-06-12 NOTE — Assessment & Plan Note (Signed)
Multifactorial. D/w pt and son ENT appt pending

## 2017-06-12 NOTE — Assessment & Plan Note (Signed)
Discussed Labs 

## 2017-06-12 NOTE — Assessment & Plan Note (Signed)
Amlodipine, HCTZ °

## 2017-06-12 NOTE — Assessment & Plan Note (Signed)
[place]

## 2017-06-12 NOTE — Assessment & Plan Note (Signed)
UA Ceftin 

## 2017-06-12 NOTE — Progress Notes (Signed)
Subjective:  Patient ID: [name], female    DOB: Jun 10, 1926  Age: 83 y.o. MRN: [id number]  CC: No chief complaint on file.   HPI [name] presents for abd pain and pain on urination since Sun am. C/o incontinence - new. C/o weakness. F/u HTN, B12 def  Outpatient Medications Prior to Visit  Medication Sig Dispense Refill  . acetaminophen (TYLENOL) 500 MG tablet Take 500 mg by mouth 2 (two) times daily.     Marland Kitchen amLODipine (NORVASC) 2.5 MG tablet Take 1 tablet (2.5 mg total) by mouth daily. (Patient taking differently: Take 2.5 mg by mouth daily. Takes 1/2 tablet daily) 90 tablet 3  . Ascorbic Acid (VITAMIN C PO) Take 1 tablet by mouth daily.    Marland Kitchen aspirin 81 MG EC tablet Take 81 mg by mouth daily.      . B-D 3CC LUER-LOK SYR 25GX1" 25G X 1" 3 ML MISC USE TO ADMINISTER B12 INJECTIONS 10 each 2  . Bioflavonoid Products (GRAPE SEED PO) Take 1 tablet by mouth daily.    . Cholecalciferol (VITAMIN D3) 2000 units capsule Take 1 capsule (2,000 Units total) by mouth daily. 100 capsule 3  . conjugated estrogens (PREMARIN) vaginal cream 1/2 gram vaginally every night x 1 week, then twice weekly at bedtime 90 g 3  . cromolyn (OPTICROM) 4 % ophthalmic solution 1 DROP IN BOTH EYES FOUR TIMES A DAY  12  . [facility] ALLERGY RELIEF 10 MG tablet TAKE 1 TABLET (10 MG TOTAL) BY MOUTH DAILY. 90 tablet 0  . cyanocobalamin (,VITAMIN B-12,) 1000 MCG/ML injection ADMINISTER 1 CC SUBQ EVERY 4 WEEKS AS DIRECTED 10 mL 0  . Erythromycin 2 % ointment Apply to affected area 2 times daily 25 g 0  . fluticasone (FLONASE) 50 MCG/ACT nasal spray Place 2 sprays into both nostrils daily. 48 g 0  . folic acid (FOLVITE) 1 MG tablet Take 1 mg by mouth daily. For restless legs    . gabapentin (NEURONTIN) 300 MG capsule TAKE 1 CAPSULE (300 MG TOTAL) BY MOUTH DAILY. 90 capsule 1  . GINSENG PO Take 1 tablet by mouth daily.    . hydrochlorothiazide (MICROZIDE) 12.5 MG capsule TAKE ONE CAPSULE BY MOUTH EVERY DAY FOR BLOOD PRESSURE  90 capsule 1  . Insulin Syringe-Needle U-100 (B-D INS SYR ULTRAFINE 1CC/30G) 30G X 1/2" 1 ML MISC USE TO ADMINISTER B12 INJECTIONS 10 each 1  . Insulin Syringe-Needle U-100 (B-D INS SYRINGE 0.5CC/30GX1/2") 30G X 1/2" 0.5 ML MISC by Does not apply route. To administer B12 injections     . KLOR-CON M10 10 MEQ tablet TAKE 1 TABLET (10 MEQ TOTAL) BY MOUTH DAILY. 90 tablet 1  . latanoprost (XALATAN) 0.005 % ophthalmic solution Place 1 drop into both eyes at bedtime. 2.5 mL 3  . MAGNESIUM CHLORIDE PO Take 1 tablet by mouth daily.    . Multiple Vitamins-Minerals (CENTRUM SILVER 50+WOMEN) TABS Take by mouth.    . pantoprazole (PROTONIX) 40 MG tablet Take 1 tablet (40 mg total) by mouth 2 (two) times daily before a meal. 60 tablet 5  . ranitidine (ZANTAC) 150 MG tablet Take 1 tablet (150 mg total) by mouth at bedtime. 30 tablet 11  . simvastatin (ZOCOR) 10 MG tablet Take 1 tablet (10 mg total) by mouth at bedtime. 90 tablet 3  . vitamin B-12 (CYANOCOBALAMIN) 1000 MCG tablet Take 1 tablet (1,000 mcg total) 3 (three) times a week by mouth.    . vitamin E 400 UNIT  capsule Take 400 Units by mouth daily.     No facility-administered medications prior to visit.     ROS Review of Systems  Constitutional: Positive for fatigue. Negative for activity change, appetite change, chills and unexpected weight change.  HENT: Negative for congestion, mouth sores and sinus pressure.   Eyes: Negative for visual disturbance.  Respiratory: Negative for cough and chest tightness.   Gastrointestinal: Positive for nausea. Negative for abdominal pain.  Genitourinary: Positive for dysuria. Negative for difficulty urinating, frequency and vaginal pain.  Musculoskeletal: Negative for back pain and gait problem.  Skin: Negative for pallor and rash.  Neurological: Positive for weakness. Negative for dizziness, tremors, numbness and headaches.  Psychiatric/Behavioral: Negative for confusion and sleep disturbance.    Objective:   BP 110/64 (BP Location: Left Arm, Patient Position: Sitting, Cuff Size: Normal)   Pulse 83   Temp 98 F (36.7 C) (Oral)   Ht 5\' 3"  (1.6 m)   Wt 133 lb (60.3 kg)   SpO2 98%   BMI 23.56 kg/m   BP Readings from Last 3 Encounters:  06/12/17 110/64  05/29/17 127/63  05/29/17 128/72    Wt Readings from Last 3 Encounters:  06/12/17 133 lb (60.3 kg)  05/29/17 134 lb (60.8 kg)  05/15/17 136 lb (61.7 kg)    Physical Exam  Constitutional: She appears well-developed. No distress.  HENT:  Head: Normocephalic.  Right Ear: External ear normal.  Left Ear: External ear normal.  Nose: Nose normal.  Mouth/Throat: Oropharynx is clear and moist.  Eyes: Conjunctivae are normal. Pupils are equal, round, and reactive to light. Right eye exhibits no discharge. Left eye exhibits no discharge.  Neck: Normal range of motion. Neck supple. No JVD present. No tracheal deviation present. No thyromegaly present.  Cardiovascular: Normal rate, regular rhythm and normal heart sounds.  Pulmonary/Chest: No stridor. No respiratory distress. She has no wheezes.  Abdominal: Soft. Bowel sounds are normal. She exhibits no distension and no mass. There is no tenderness. There is no rebound and no guarding.  Musculoskeletal: She exhibits no edema or tenderness.  Lymphadenopathy:    She has no cervical adenopathy.  Neurological: She displays normal reflexes. No cranial nerve deficit. She exhibits normal muscle tone. Coordination abnormal.  Skin: No rash noted. No erythema. There is pallor.  Psychiatric: She has a normal mood and affect. Her behavior is normal. Judgment and thought content normal.  [name]  Lab Results  Component Value Date   WBC 5.5 05/29/2017   HGB 14.6 05/29/2017   HCT 43.6 05/29/2017   PLT 154 05/29/2017   GLUCOSE 83 11/23/2016   CHOL 191 10/01/2014   TRIG 72.0 10/01/2014   HDL 57.80 10/01/2014   LDLCALC 119 (H) 10/01/2014   ALT 15 11/23/2016   AST 21 11/23/2016   NA 134 (L)  11/23/2016   K 4.3 11/23/2016   CL 97 11/23/2016   CREATININE 0.88 11/23/2016   BUN 25 (H) 11/23/2016   CO2 31 11/23/2016   TSH 3.38 12/03/2015   INR 1.05 05/10/2016   HGBA1C  10/07/2008    5.9 (NOTE)   The ADA recommends the following therapeutic goal for glycemic   control related to Hgb A1C measurement:   Goal of Therapy:   < 7.0% Hgb A1C   Reference: American Diabetes Association: Clinical Practice   Recommendations 2008, Diabetes Care,  2008, 31:(Suppl 1).   MICROALBUR 0.4 07/14/2009    No results found.  Assessment & Plan:   There are no diagnoses  linked to this encounter. I am having Rito Ehrlich. Hamme maintain her Insulin Syringe-Needle U-100, aspirin, acetaminophen, folic acid, B-D 3CC LUER-LOK SYR 25GX1", conjugated estrogens, cromolyn, cyanocobalamin, Ascorbic Acid (VITAMIN C PO), GINSENG PO, Bioflavonoid Products (GRAPE SEED PO), MAGNESIUM CHLORIDE PO, Insulin Syringe-Needle U-100, fluticasone, Vitamin D3, ranitidine, hydrochlorothiazide, CENTRUM SILVER 50+WOMEN, vitamin E, simvastatin, amLODipine, gabapentin, latanoprost, KLOR-CON M10, Erythromycin, pantoprazole, CVS ALLERGY RELIEF, and vitamin B-12.  No orders of the defined types were placed in this encounter.    Follow-up: No Follow-up on file.  Walker Kehr, MD

## 2017-06-12 NOTE — Telephone Encounter (Signed)
Mailed patient calendar of upcoming appointments per 11/9 sch message

## 2017-06-12 NOTE — Assessment & Plan Note (Signed)
UTIs: pyridium prn

## 2017-06-18 ENCOUNTER — Telehealth: Payer: Self-pay | Admitting: Internal Medicine

## 2017-06-18 ENCOUNTER — Other Ambulatory Visit: Payer: Self-pay

## 2017-06-18 DIAGNOSIS — Z111 Encounter for screening for respiratory tuberculosis: Secondary | ICD-10-CM

## 2017-06-18 DIAGNOSIS — C9110 Chronic lymphocytic leukemia of B-cell type not having achieved remission: Secondary | ICD-10-CM

## 2017-06-18 MED ORDER — LATANOPROST 0.005 % OP SOLN: 1.0000 [drp] | Freq: Every day | OPHTHALMIC | 1 refills | Status: DC

## 2017-06-18 NOTE — Telephone Encounter (Signed)
Pt called stating that she has lukemia and her legs are very weak. She wanted to know what services would be available to have someone come out to her home. She said that she lives alone and would like someone to come check on her during the day. Please advise.

## 2017-06-19 ENCOUNTER — Telehealth: Payer: Self-pay | Admitting: Internal Medicine

## 2017-06-19 ENCOUNTER — Emergency Department (HOSPITAL_COMMUNITY)
Admission: EM | Admit: 2017-06-19 | Discharge: 2017-06-19 | Disposition: A | Discharge status: Home / Self Care | Payer: Medicare Other | Attending: Emergency Medicine | Admitting: Emergency Medicine

## 2017-06-19 ENCOUNTER — Other Ambulatory Visit: Payer: Self-pay

## 2017-06-19 ENCOUNTER — Encounter (HOSPITAL_COMMUNITY): Payer: Self-pay

## 2017-06-19 DIAGNOSIS — Z79899 Other long term (current) drug therapy: Secondary | ICD-10-CM | POA: Diagnosis not present

## 2017-06-19 DIAGNOSIS — Z856 Personal history of leukemia: Secondary | ICD-10-CM | POA: Insufficient documentation

## 2017-06-19 DIAGNOSIS — N390 Urinary tract infection, site not specified: Secondary | ICD-10-CM | POA: Insufficient documentation

## 2017-06-19 DIAGNOSIS — N9089 Other specified noninflammatory disorders of vulva and perineum: Secondary | ICD-10-CM | POA: Insufficient documentation

## 2017-06-19 DIAGNOSIS — R1030 Lower abdominal pain, unspecified: Secondary | ICD-10-CM | POA: Diagnosis present

## 2017-06-19 LAB — URINALYSIS, ROUTINE W REFLEX MICROSCOPIC
BILIRUBIN URINE: NEGATIVE
Glucose, UA: NEGATIVE mg/dL
KETONES UR: NEGATIVE mg/dL
NITRITE: NEGATIVE
PH: 7 (ref 5.0–8.0)
Protein, ur: NEGATIVE mg/dL
SQUAMOUS EPITHELIAL / LPF: NONE SEEN
Specific Gravity, Urine: 1.005 (ref 1.005–1.030)

## 2017-06-19 MED ORDER — CLOTRIMAZOLE-BETAMETHASONE 1-0.05 % EX CREA: TOPICAL_CREAM | CUTANEOUS | 0 refills | Status: DC

## 2017-06-19 MED ORDER — CIPROFLOXACIN HCL 250 MG PO TABS: 250.0000 mg | ORAL_TABLET | Freq: Two times a day (BID) | ORAL | 0 refills | Status: DC

## 2017-06-19 NOTE — Telephone Encounter (Signed)
Patient Name: [name]  DOB: May 16, 1926    Initial Comment Caller states last week she was seen for a bladder infection and now she feels like something is pushing on her bladder causing her to urinate on herself.    Nurse Assessment  Nurse: Raphael Gibney, RN, Vanita Ingles Date/Time (Eastern Time): 06/19/2017 9:40:39 AM  Confirm and document reason for call. If symptomatic, describe symptoms. ---Caller states she was seen last week for a bladder infection. She feels like something is pushing on her bladder causing her to be incontinent of urine. She finished antibiotics. She is having to change pads frequently.  Does the patient have any new or worsening symptoms? ---Yes  Will a triage be completed? ---Yes  Related visit to physician within the last 2 weeks? ---No  Does the PT have any chronic conditions? (i.e. diabetes, asthma, etc.) ---Yes  List chronic conditions. ---leukemia  Is this a behavioral health or substance abuse call? ---No     Guidelines    Guideline Title Affirmed Question Affirmed Notes  Urinary Symptoms [1] Can't control passage of urine (i.e., urinary incontinence) AND [2] new onset (< 2 weeks) or worsening    Final Disposition User   See Physician within 24 Hours Stringer, RN, Vanita Ingles    Comments  No appts available within 24 hrs with Dr. Alain Marion. Please call pt back regarding appt.   Referrals  REFERRED TO PCP OFFICE   Caller Disagree/Comply Comply  Caller Understands Yes  PreDisposition Call Doctor

## 2017-06-19 NOTE — Telephone Encounter (Signed)
I spoke with patient.  She states she feels like her bladder has fallen.  States she has really bad pain when sitting upright.  I have advised patient to go to the ED.  Patient states her son went out to get something to eat and once he got back she would have him take her to [facility] ED.

## 2017-06-19 NOTE — ED Triage Notes (Signed)
Patient c/o uncontrolled urinating. Patient states she went to her PCP a week ago and was diagnosed with a UTI and was prescribed antibiotics. Patient states she was called by the PCP office and told she might have a bladder prolapse and to go to the ED. Patient c/o pain in the bladder and states that the pain is less when she is lying flat. Patient also receives infusions for leukemia.

## 2017-06-19 NOTE — ED Notes (Signed)
Bed: WA08 Expected date:  Expected time:  Means of arrival:  Comments: [place]

## 2017-06-19 NOTE — Discharge Instructions (Signed)
The urinalysis indicates that you still have a urinary tract infection.  We are prescribing a new antibiotic to help that.  The sensation of bladder fullness, and prolapse, appears to be caused by a fissure at the opening to the vagina, near the bottom.  To help this area he will try to soak in a warm tub or use a warm compress on this area for 20 or 30 minutes 2-3 times a day.  After soaking, clean with soap and water well, then dry.  Following that I applied the Lotrisone cream to the affected area to help it heal.

## 2017-06-19 NOTE — ED Notes (Signed)
Patient states that she was suppose to come to the emergency room for surgery.

## 2017-06-19 NOTE — ED Provider Notes (Signed)
[facility] DEPT Provider Note   CSN: [id number] Arrival date & time: 06/19/17  1620     History   Chief Complaint Chief Complaint  Patient presents with  . bladder issues    HPI [name] is a 83 y.o. female.  She is here for evaluation of painful urination since 1 week ago.  Today she became concerned that her bladder had dropped down, so decided to come here for evaluation.  She was prescribed and has been taking Ceftin 250 mg twice daily, since 06/12/17.  She feels like the "infection" is better.  She is bothered by a persistent sensation of her bladder having dropped into her vagina.  She denies fever, chills, nausea, vomiting, weakness or dizziness.  No prior similar symptoms.  There are no other known modifying factors.  HPI  Past Medical History:  Diagnosis Date  . Allergy    rhinitis  . Chronic ethmoidal sinusitis   . Diverticulosis of colon   . Glaucoma   . Hx of colonic polyps   . Hyperlipidemia   . Hypertension   . Hypogammaglobulinemia ([place])    Monthly IVIG  . Leukemia ([place])    Chronic lymphocytic leukemia  . Osteoarthritis   . Peripheral neuropathy   . Pernicious anemia   . UTI (lower urinary tract infection) 2010  . Vitamin B12 deficiency     Patient Active Problem List   Diagnosis Date Noted  . Urinary tract infection, recurrent 06/12/2017  . Thrush 02/10/2017  . Frequent urination 02/10/2017  . Osteoporosis 11/23/2016  . Abdominal pain 08/16/2016  . Urinary frequency 08/16/2016  . Peripheral edema 08/16/2016  . General weakness 08/16/2016  . Gait disorder 08/16/2016  . Dysuria 08/03/2016  . Syncope and collapse 05/10/2016  . Closed displaced simple supracondylar fracture of right humerus without intercondylar fracture 05/10/2016  . Urinary tract infection without hematuria 05/10/2016  . Syncope 05/10/2016  . Humeral head fracture, right, closed, initial encounter   . Burning tongue 03/25/2016  . Post  concussion syndrome 03/03/2016  . Fall 03/03/2016  . Cystocele 03/05/2015  . Dehydration 02/08/2015  . Hyponatremia 02/08/2015  . Well adult exam 10/01/2014  . Grief at loss of child 12/11/2013  . Irregular heart beat 09/29/2013  . Humeral head fracture 08/26/2013  . Actinic keratoses 08/26/2013  . Carotid bruit 05/02/2012  . Anxiety 03/17/2011  . ANEMIA, PERNICIOUS, HX OF 10/10/2007  . Peripheral neuropathy ([place]) 09/04/2007  . Chronic fatigue 08/06/2007  . Chronic lymphocytic leukemia ([place]) 06/17/2007  . Allergic rhinitis 06/17/2007  . OSTEOARTHRITIS 06/17/2007  . B12 deficiency 06/07/2007  . GASTROESOPHAGEAL REFLUX DISEASE 04/30/2007  . Dyslipidemia 12/19/2006  . Essential hypertension 12/19/2006  . DIVERTICULOSIS, COLON 05/19/2003  . COLONIC POLYPS, HX OF 01/10/2000    Past Surgical History:  Procedure Laterality Date  . ABDOMINAL HYSTERECTOMY  1987  . bilateral cataracts  2007  . bladder tack  1987  . L4-L5 Laminetomy  1999  . TUBAL LIGATION    . tubaligation  1961  . ureter reconstruction  1992    OB History    Gravida Para Term Preterm AB Living   4 4       4    SAB TAB Ectopic Multiple Live Births                   Home Medications    Prior to Admission medications   Medication Sig Start Date End Date Taking? Authorizing Provider  acetaminophen (TYLENOL) 500 MG  tablet Take 500 mg by mouth 2 (two) times daily.    Yes [provider]  amLODipine (NORVASC) 2.5 MG tablet Take 1 tablet (2.5 mg total) by mouth daily. Patient taking differently: Take 2.5 mg by mouth daily. Takes 1/2 tablet daily 02/27/17  Yes Plotnikov, Evie Lacks, MD  Ascorbic Acid (VITAMIN C PO) Take 1 tablet by mouth daily.   Yes [provider]  aspirin 81 MG EC tablet Take 81 mg by mouth daily.     Yes [provider]  Bioflavonoid Products (GRAPE SEED PO) Take 1 tablet by mouth daily.   Yes [provider]  Cholecalciferol (VITAMIN D3) 2000 units capsule  Take 1 capsule (2,000 Units total) by mouth daily. 11/23/16  Yes Plotnikov, Evie Lacks, MD  conjugated estrogens (PREMARIN) vaginal cream 1/2 gram vaginally every night x 1 week, then twice weekly at bedtime 12/29/15  Yes Salvadore Dom, MD  cromolyn (OPTICROM) 4 % ophthalmic solution 1 DROP IN BOTH EYES FOUR TIMES A DAY 03/21/16  Yes [provider]  [employer]S ALLERGY RELIEF 10 MG tablet TAKE 1 TABLET (10 MG TOTAL) BY MOUTH DAILY. 05/26/17  Yes Plotnikov, Evie Lacks, MD  cyanocobalamin (,VITAMIN B-12,) 1000 MCG/ML injection ADMINISTER 1 CC SUBQ EVERY 4 WEEKS AS DIRECTED 08/07/16  Yes Plotnikov, Evie Lacks, MD  Erythromycin 2 % ointment Apply to affected area 2 times daily 05/12/17  Yes Dorie Rank, MD  fluticasone [facility]) 50 MCG/ACT nasal spray Place 2 sprays into both nostrils daily. 11/21/16  Yes Plotnikov, Evie Lacks, MD  folic acid (FOLVITE) 1 MG tablet Take 1 mg by mouth daily. For restless legs   Yes [provider]  gabapentin (NEURONTIN) 300 MG capsule TAKE 1 CAPSULE (300 MG TOTAL) BY MOUTH DAILY. 03/12/17  Yes Plotnikov, Evie Lacks, MD  GINSENG PO Take 1 tablet by mouth daily.   Yes [provider]  hydrochlorothiazide (MICROZIDE) 12.5 MG capsule TAKE ONE CAPSULE BY MOUTH EVERY DAY FOR BLOOD PRESSURE 01/04/17  Yes Plotnikov, Evie Lacks, MD  KLOR-CON M10 10 MEQ tablet TAKE 1 TABLET (10 MEQ TOTAL) BY MOUTH DAILY. 05/07/17  Yes Plotnikov, Evie Lacks, MD  latanoprost (XALATAN) 0.005 % ophthalmic solution Place 1 drop into both eyes at bedtime. 06/18/17  Yes Plotnikov, Evie Lacks, MD  MAGNESIUM CHLORIDE PO Take 1 tablet by mouth daily.   Yes [provider]  Multiple Vitamins-Minerals (CENTRUM SILVER 50+WOMEN) TABS Take by mouth.   Yes [provider]  pantoprazole (PROTONIX) 40 MG tablet Take 1 tablet (40 mg total) by mouth 2 (two) times daily before a meal. 05/15/17  Yes Burns, Claudina Lick, MD  ranitidine (ZANTAC) 150 MG tablet Take 1 tablet (150 mg total) by mouth at  bedtime. 12/06/16  Yes Plotnikov, Evie Lacks, MD  simvastatin (ZOCOR) 10 MG tablet Take 1 tablet (10 mg total) by mouth at bedtime. 02/27/17  Yes Plotnikov, Evie Lacks, MD  vitamin E 400 UNIT capsule Take 400 Units by mouth daily.   Yes [provider]  B-D 3CC LUER-LOK SYR 25GX1" 25G X 1" 3 ML MISC USE TO ADMINISTER B12 INJECTIONS 09/08/13   Plotnikov, Evie Lacks, MD  B-D INS SYRINGE 0.5CC/30GX1/2" 30G X 1/2" 0.5 ML MISC USE TO ADMINISTER B12 INJECTIONS 06/13/17   Plotnikov, Evie Lacks, MD  cefUROXime (CEFTIN) 250 MG tablet Take 1 tablet (250 mg total) 2 (two) times daily for 7 days by mouth. Patient not taking: Reported on 06/19/2017 06/12/17 06/19/17  Plotnikov, Evie Lacks, MD  ciprofloxacin (CIPRO)  250 MG tablet Take 1 tablet (250 mg total) by mouth every 12 (twelve) hours. 06/19/17   Daleen Bo, MD  clotrimazole-betamethasone (LOTRISONE) cream Apply to affected lower vaginal area, 2 times daily, until healed 06/19/17   Daleen Bo, MD  Insulin Syringe-Needle U-100 (B-D INS SYR ULTRAFINE 1CC/30G) 30G X 1/2" 1 ML MISC USE TO ADMINISTER B12 INJECTIONS 08/29/16   Plotnikov, Evie Lacks, MD  phenazopyridine (PYRIDIUM) 100 MG tablet Take 1-2 tablets (100-200 mg total) 3 (three) times daily as needed by mouth for pain. Patient not taking: Reported on 06/19/2017 06/12/17 06/12/18  Plotnikov, Evie Lacks, MD  vitamin B-12 (CYANOCOBALAMIN) 1000 MCG tablet Take 1 tablet (1,000 mcg total) 3 (three) times a week by mouth. Patient not taking: Reported on 06/19/2017 05/30/17   Ladell Pier, MD    Family History Family History  Problem Relation Age of Onset  . Jaundice Father   . Stroke Son 72       in NH  . Cancer Mother        uterine  . Diabetes Mother   . Other Sister        brain tumor  . Cancer Sister   . Hypertension Other   . Cancer Other        breast  . Thyroid disease Sister   . Cancer Sister        thyroid cancer    Social History Social History   Tobacco Use  . Smoking  status: Never Smoker  . Smokeless tobacco: Never Used  Substance Use Topics  . Alcohol use: No    Alcohol/week: 0.0 oz  . Drug use: No     Allergies   Pneumococcal vaccines; Clarithromycin; Hctz [hydrochlorothiazide]; Oxycodone-aspirin; and Propoxyphene n-acetaminophen   Review of Systems Review of Systems  All other systems reviewed and are negative.    Physical Exam Updated Vital Signs BP 126/70 (BP Location: Right Arm)   Pulse 67   Temp 97.7 F (36.5 C) (Oral)   Resp 16   Ht 5\' 3"  (1.6 m)   Wt 60.3 kg (133 lb)   SpO2 96%   BMI 23.56 kg/m   Physical Exam  Constitutional: She is oriented to person, place, and time. She appears well-developed.  Elderly, frail  HENT:  Head: Normocephalic and atraumatic.  Eyes: Conjunctivae and EOM are normal. Pupils are equal, round, and reactive to light.  Neck: Normal range of motion and phonation normal. Neck supple.  Cardiovascular: Normal rate and regular rhythm.  Pulmonary/Chest: Effort normal and breath sounds normal. She exhibits no tenderness.  Abdominal: Soft. She exhibits no distension. There is no tenderness. There is no guarding.  Genitourinary:  Genitourinary Comments: External female genitalia normal with the exception of a fissure at the inferior portion of the vaginal opening.  This area is open, but not draining or bleeding.  There is mild surrounding inflammation, but no drainage.  The patient is moderately tender to palpation in the area of the inferior vagina, preventing a bimanual examination.  The urethra appears in the normal position.  There is no prolapse of perineal tissue appreciated.  Musculoskeletal: Normal range of motion.  Neurological: She is alert and oriented to person, place, and time. She exhibits normal muscle tone.  Skin: Skin is warm and dry.  Psychiatric: She has a normal mood and affect. Her behavior is normal. Judgment and thought content normal.  Nursing note and vitals reviewed.    ED  Treatments / Results  Labs (all labs ordered  are listed, but only abnormal results are displayed) Labs Reviewed  URINALYSIS, ROUTINE W REFLEX MICROSCOPIC - Abnormal; Notable for the following components:      Result Value   APPearance HAZY (*)    Hgb urine dipstick LARGE (*)    Leukocytes, UA LARGE (*)    Bacteria, UA FEW (*)    All other components within normal limits  URINE CULTURE    EKG  EKG Interpretation None       Radiology No results found.  Procedures Procedures (including critical care time)  Medications Ordered in ED Medications - No data to display   Initial Impression / Assessment and Plan / ED Course  I have reviewed the triage vital signs and the nursing notes.  Pertinent labs & imaging results that were available during my care of the patient were reviewed by me and considered in my medical decision making (see chart for details).      Patient Vitals for the past 24 hrs:  BP Temp Temp src Pulse Resp SpO2 Height Weight  06/19/17 1917 126/70 - - 67 16 96 % - -  06/19/17 1631 - - - - - - 5\' 3"  (1.6 m) 60.3 kg (133 lb)  06/19/17 1629 136/80 97.7 F (36.5 C) Oral 73 16 94 % - -    7:47 PM Reevaluation with update and discussion. After initial assessment and treatment, an updated evaluation reveals no change in clinical status.  Findings discussed with patient and son, all questions were answered. Daleen Bo      Final Clinical Impressions(s) / ED Diagnoses   Final diagnoses:  Urinary tract infection without hematuria, site unspecified  Fissure of vulva    Apparent persistent urinary tract infection, possibly aggravated by an inflamed fissure at the vaginal introitus, lower vulva.  No sign of significant cellulitis, sepsis or metabolic instability.  Nursing Notes Reviewed/ Care Coordinated Applicable Imaging Reviewed Interpretation of Laboratory Data incorporated into ED treatment  The patient appears reasonably screened and/or stabilized  for discharge and I doubt any other medical condition or other [facility] requiring further screening, evaluation, or treatment in the ED at this time prior to discharge.  Plan: Home Medications-continue usual medications; Home Treatments-warm soaks, or compresses to lower vulvar region; return here if the recommended treatment, does not improve the symptoms; Recommended follow up-PCP checkup 1 week.    ED Discharge Orders        Ordered    ciprofloxacin (CIPRO) 250 MG tablet  Every 12 hours     06/19/17 1943    clotrimazole-betamethasone (LOTRISONE) cream     06/19/17 1943       Daleen Bo, MD 06/19/17 1948

## 2017-06-20 ENCOUNTER — Telehealth: Payer: Self-pay

## 2017-06-20 NOTE — Telephone Encounter (Signed)
Pt. Called back to report she talked to [facility] about going in for a short period of time.They told her to have him contact them via fax - this was the best way for her to be admitted. Fax number [phone].

## 2017-06-20 NOTE — Telephone Encounter (Signed)
Agree. Thx 

## 2017-06-20 NOTE — Telephone Encounter (Signed)
Pt. Called and reports she was in ED yesterday with "bladder infection." States "I need to go stay at the nursing home for awhile until I'm stronger. I'm just too weak. My son comes in and out and he doesn't understand. And I need help with my medications." Instructed her to talk to her son about this. Instructed pt. If she ever feels so weak she is not safe to call 911. Verbalizes understanding and states she will talk to her son about going to the nursing home.

## 2017-06-20 NOTE — Telephone Encounter (Signed)
[name], states [name] was working on this?

## 2017-06-21 ENCOUNTER — Telehealth: Payer: Self-pay | Admitting: Internal Medicine

## 2017-06-21 ENCOUNTER — Other Ambulatory Visit: Payer: Self-pay

## 2017-06-21 ENCOUNTER — Telehealth: Payer: Self-pay | Admitting: *Deleted

## 2017-06-21 NOTE — Telephone Encounter (Signed)
Chest xray ordered

## 2017-06-21 NOTE — Telephone Encounter (Signed)
Patient does not need new xray, pt notified

## 2017-06-21 NOTE — Telephone Encounter (Signed)
Forms faxed

## 2017-06-21 NOTE — Telephone Encounter (Addendum)
Called and spoke to patient who explained that she wanted to go to [facility] facility due to weakness. Stating she is afraid she may fall, she is having difficulty doing the sitz baths she needs to do several times daily, she feels confused and is afraid she might not be taking her medications correctly. Patient states she has been in contact with [facility] nursing facility and that they need a FL2 form to filled out and sent to the facility.   Nurse called and spoke to the admission representative [facility]. Colletta Maryland states that the patient has stayed at [facility] previously and that there is availability for her to come and receive care.  Colletta Maryland confirmed that a FL2 form and a CXR to rule out TB would be needed for the patient to be admitted. Nurse discussed with Colletta Maryland that she would discuss this with doctor Plotnikov and get back in touch with her to confirm admission. Colletta Maryland did provide a fax number of which to send the [place] form [phone].

## 2017-06-21 NOTE — Telephone Encounter (Signed)
Copied from [place]. Topic: General - Other >> Jun 21, 2017  1:50 PM Synthia Innocent wrote: Reason for CRM: [facility]. Does she still need another CRX?

## 2017-06-21 NOTE — Telephone Encounter (Signed)
In regards to telephone note from 06/18/17.

## 2017-06-21 NOTE — Telephone Encounter (Signed)
Spoke to [place] from [facility] facility who states she does have the filled-out FL2 form. Colletta Maryland was informed that the CXR has been ordered and that the patient will be called and notified that she can come to the [facility] facility to have the CXR. Colletta Maryland stated that she does have excess to see the results of the CXR and as soon as she sees that the x-ray is negative she will call the patient to be admitted to [facility] facility.   Called Ms. Moser to inform that she can come to [facility] radiology to have CXR and that Stephanie from [facility] will call her for admission to the facility as soon as she sees the x-ray is negative. Patient verbalized understanding. Both Colletta Maryland and the patient will call this nurse back if they have any further questions or concerns.

## 2017-06-21 NOTE — Addendum Note (Signed)
Addended by: Karren Cobble on: 06/21/2017 01:16 PM   Modules accepted: Orders

## 2017-06-22 ENCOUNTER — Ambulatory Visit (INDEPENDENT_AMBULATORY_CARE_PROVIDER_SITE_OTHER)
Admission: RE | Admit: 2017-06-22 | Discharge: 2017-06-22 | Disposition: A | Discharge status: Home / Self Care | Payer: Medicare Other | Source: Ambulatory Visit | Attending: Internal Medicine | Admitting: Internal Medicine

## 2017-06-22 DIAGNOSIS — Z111 Encounter for screening for respiratory tuberculosis: Secondary | ICD-10-CM

## 2017-06-22 LAB — URINE CULTURE: Culture: >=100000 — AB

## 2017-06-23 ENCOUNTER — Telehealth: Payer: Self-pay

## 2017-06-23 NOTE — Telephone Encounter (Signed)
Post ED Visit - Positive Culture Follow-up  Culture report reviewed by antimicrobial stewardship pharmacist:  []  Elenor Quinones, Pharm.D. []  Heide Guile, Pharm.D., BCPS AQ-ID []  Parks Neptune, Pharm.D., BCPS []  Alycia Rossetti, Pharm.D., BCPS []  [place], Pharm.D., BCPS, AAHIVP []  Legrand Como, Pharm.D., BCPS, AAHIVP []  Salome Arnt, PharmD, BCPS []  Dimitri Ped, PharmD, BCPS []  Vincenza Hews, PharmD, BCPS AMM Pharm D Positive urine culture Treated with Ciprofloxacin, organism sensitive to the same and no further patient follow-up is required at this time.  Genia Del 06/23/2017, 9:33 AM

## 2017-06-26 ENCOUNTER — Ambulatory Visit (HOSPITAL_BASED_OUTPATIENT_CLINIC_OR_DEPARTMENT_OTHER): Payer: Medicare Other

## 2017-06-26 ENCOUNTER — Ambulatory Visit: Payer: Medicare Other

## 2017-06-26 VITALS — BP 136/72 | HR 72 | Temp 97.9°F | Resp 16

## 2017-06-26 DIAGNOSIS — D801 Nonfamilial hypogammaglobulinemia: Secondary | ICD-10-CM | POA: Diagnosis not present

## 2017-06-26 DIAGNOSIS — C9110 Chronic lymphocytic leukemia of B-cell type not having achieved remission: Secondary | ICD-10-CM

## 2017-06-26 MED ORDER — DIPHENHYDRAMINE HCL 25 MG PO CAPS
ORAL_CAPSULE | ORAL | Status: AC
  Filled 2017-06-26: qty 1

## 2017-06-26 MED ORDER — DIPHENHYDRAMINE HCL 25 MG PO CAPS
25.0000 mg | ORAL_CAPSULE | Freq: Once | ORAL | Status: AC
  Administered 2017-06-26: 25 mg via ORAL

## 2017-06-26 MED ORDER — IMMUNE GLOBULIN (HUMAN) 10 GM/100ML IV SOLN
30.0000 g | Freq: Once | INTRAVENOUS | Status: AC
  Administered 2017-06-26: 30 g via INTRAVENOUS
  Filled 2017-06-26: qty 100

## 2017-06-26 NOTE — Patient Instructions (Signed)

## 2017-07-12 ENCOUNTER — Other Ambulatory Visit: Payer: Self-pay

## 2017-07-12 MED ORDER — HYDROCHLOROTHIAZIDE 12.5 MG PO CAPS: 12.5000 mg | ORAL_CAPSULE | Freq: Every day | ORAL | 1 refills | Status: DC

## 2017-07-15 ENCOUNTER — Other Ambulatory Visit: Payer: Self-pay | Admitting: Internal Medicine

## 2017-07-15 DIAGNOSIS — C9110 Chronic lymphocytic leukemia of B-cell type not having achieved remission: Secondary | ICD-10-CM

## 2017-07-18 ENCOUNTER — Telehealth: Payer: Self-pay | Admitting: Internal Medicine

## 2017-07-18 NOTE — Telephone Encounter (Signed)
Copied from [place]. Topic: Quick Communication - Rx Refill/Question >> Jul 18, 2017 10:55 AM Oliver Pila B wrote: Has the patient contacted their pharmacy? Yes Asking for magic mouthwash Pharmacy no longer sells OTC, stated a Rx is needed Contact pt to advise or contact pharmacy for info

## 2017-07-19 MED ORDER — MAGIC MOUTHWASH W/LIDOCAINE: 5.0000 mL | Freq: Three times a day (TID) | ORAL | 0 refills | Status: DC | PRN

## 2017-07-19 NOTE — Telephone Encounter (Signed)
Notified pt MD sent rx to [facility].../lm,b

## 2017-07-19 NOTE — Telephone Encounter (Signed)
Done. Thx.

## 2017-07-20 ENCOUNTER — Telehealth: Payer: Self-pay

## 2017-07-20 ENCOUNTER — Telehealth: Payer: Self-pay | Admitting: Internal Medicine

## 2017-07-20 NOTE — Telephone Encounter (Signed)
Copied from [place] [id number]. Topic: General - Other >> Jul 19, 2017  4:49 PM Valla Leaver wrote: Reason for CRM: Patient calling to let Dr. Alain Marion she completed PT eval today at home.

## 2017-07-20 NOTE — Telephone Encounter (Signed)
Copied from [place] [id number]. Topic: Quick Communication - See Telephone Encounter >> Jul 20, 2017  9:48 AM Malena Catholic I, NT wrote: CRM for notification. See Telephone encounter for: Pt Need physical Therapy X 2 for 8 Weeks , Speech Therapy and skill nursing facility for more inf Call [place] [phone]  07/20/17.

## 2017-07-20 NOTE — Telephone Encounter (Signed)
Ok Thx 

## 2017-07-20 NOTE — Telephone Encounter (Signed)
FYI

## 2017-07-20 NOTE — Telephone Encounter (Signed)
Called [place] no answer LMOM w/MD response...[name]

## 2017-07-20 NOTE — Telephone Encounter (Signed)
ok 

## 2017-07-23 ENCOUNTER — Telehealth: Payer: Self-pay | Admitting: Internal Medicine

## 2017-07-23 NOTE — Telephone Encounter (Signed)
Pt. States she feels like her shoulder pain is nerve pain, not muscle pain. Has an appointment this week.

## 2017-07-23 NOTE — Telephone Encounter (Signed)
Copied from [place] [id number]. Topic: Quick Communication - See Telephone Encounter >> Jul 23, 2017  9:47 AM Corie Chiquito, NT wrote: CRM for notification. See Telephone encounter for: Patient calling because she would like to know if Dr.Plotnikov could call her in some medication for pain. Patient stated that her left shoulder is in pain and the only time that she doesn't feel it is when she is sleeping. Patient [facility] [address] [facility] [place]. If someone could give her a call back at [phone]  07/23/17.

## 2017-07-25 ENCOUNTER — Encounter: Payer: Self-pay | Admitting: Internal Medicine

## 2017-07-25 ENCOUNTER — Other Ambulatory Visit (INDEPENDENT_AMBULATORY_CARE_PROVIDER_SITE_OTHER): Payer: Medicare Other

## 2017-07-25 ENCOUNTER — Ambulatory Visit (INDEPENDENT_AMBULATORY_CARE_PROVIDER_SITE_OTHER): Payer: Medicare Other | Admitting: Internal Medicine

## 2017-07-25 VITALS — BP 124/78 | HR 86 | Temp 97.8°F | Ht 63.0 in | Wt 133.0 lb

## 2017-07-25 DIAGNOSIS — R413 Other amnesia: Secondary | ICD-10-CM | POA: Diagnosis not present

## 2017-07-25 DIAGNOSIS — E871 Hypo-osmolality and hyponatremia: Secondary | ICD-10-CM

## 2017-07-25 DIAGNOSIS — R269 Unspecified abnormalities of gait and mobility: Secondary | ICD-10-CM

## 2017-07-25 DIAGNOSIS — M542 Cervicalgia: Secondary | ICD-10-CM | POA: Diagnosis not present

## 2017-07-25 DIAGNOSIS — K219 Gastro-esophageal reflux disease without esophagitis: Secondary | ICD-10-CM | POA: Diagnosis not present

## 2017-07-25 DIAGNOSIS — G309 Alzheimer's disease, unspecified: Secondary | ICD-10-CM

## 2017-07-25 DIAGNOSIS — E538 Deficiency of other specified B group vitamins: Secondary | ICD-10-CM

## 2017-07-25 DIAGNOSIS — N390 Urinary tract infection, site not specified: Secondary | ICD-10-CM

## 2017-07-25 DIAGNOSIS — F0280 Dementia in other diseases classified elsewhere without behavioral disturbance: Secondary | ICD-10-CM | POA: Insufficient documentation

## 2017-07-25 LAB — URINALYSIS
BILIRUBIN URINE: NEGATIVE
HGB URINE DIPSTICK: NEGATIVE
KETONES UR: NEGATIVE
Leukocytes, UA: NEGATIVE
Nitrite: NEGATIVE
PH: 7 (ref 5.0–8.0)
Specific Gravity, Urine: 1.015 (ref 1.000–1.030)
TOTAL PROTEIN, URINE-UPE24: NEGATIVE
URINE GLUCOSE: NEGATIVE
Urobilinogen, UA: 0.2 (ref 0.0–1.0)

## 2017-07-25 MED ORDER — METHYLPREDNISOLONE ACETATE 40 MG/ML IJ SUSP
40.0000 mg | Freq: Once | INTRAMUSCULAR | Status: AC
  Administered 2017-07-25: 40 mg via INTRAMUSCULAR

## 2017-07-25 MED ORDER — DEXLANSOPRAZOLE 30 MG PO CPDR: 30.0000 mg | DELAYED_RELEASE_CAPSULE | Freq: Every day | ORAL | 3 refills | Status: DC

## 2017-07-25 MED ORDER — DONEPEZIL HCL 5 MG PO TABS: 5.0000 mg | ORAL_TABLET | Freq: Every day | ORAL | 5 refills | Status: DC

## 2017-07-25 NOTE — Assessment & Plan Note (Signed)
L trap - trigger point inj

## 2017-07-25 NOTE — Assessment & Plan Note (Signed)
On B12 

## 2017-07-25 NOTE — Progress Notes (Signed)
Subjective:  Patient ID: [name], female    DOB: 1926/03/09  Age: 84 y.o. MRN: [id number]  CC: No chief complaint on file.   HPI [name] presents for stool impaction x2; CLL f/u She was in a NH for weakness/PT Om MOM q night C/o memory loss worse. C/o GERD - worse C/o L neck pain  Outpatient Medications Prior to Visit  Medication Sig Dispense Refill  . acetaminophen (TYLENOL) 500 MG tablet Take 500 mg by mouth 2 (two) times daily.     Marland Kitchen amLODipine (NORVASC) 2.5 MG tablet Take 1 tablet (2.5 mg total) by mouth daily. 90 tablet 3  . Ascorbic Acid (VITAMIN C PO) Take 1 tablet by mouth daily.    Marland Kitchen aspirin 81 MG EC tablet Take 81 mg by mouth daily.      . B-D INS SYRINGE 0.5CC/30GX1/2" 30G X 1/2" 0.5 ML MISC USE TO ADMINISTER B12 INJECTIONS 10 each 1  . Bioflavonoid Products (GRAPE SEED PO) Take 1 tablet by mouth daily.    . Cholecalciferol (VITAMIN D3) 2000 units capsule Take 1 capsule (2,000 Units total) by mouth daily. 100 capsule 3  . ciprofloxacin (CIPRO) 250 MG tablet Take 1 tablet (250 mg total) by mouth every 12 (twelve) hours. 14 tablet 0  . clotrimazole-betamethasone (LOTRISONE) cream Apply to affected lower vaginal area, 2 times daily, until healed 30 g 0  . conjugated estrogens (PREMARIN) vaginal cream 1/2 gram vaginally every night x 1 week, then twice weekly at bedtime 90 g 3  . cromolyn (OPTICROM) 4 % ophthalmic solution 1 DROP IN BOTH EYES FOUR TIMES A DAY  12  . [facility] ALLERGY RELIEF 10 MG tablet TAKE 1 TABLET (10 MG TOTAL) BY MOUTH DAILY. 90 tablet 0  . cyanocobalamin (,VITAMIN B-12,) 1000 MCG/ML injection ADMINISTER 1 CC SUBQ EVERY 4 WEEKS AS DIRECTED 10 mL 0  . Erythromycin 2 % ointment Apply to affected area 2 times daily 25 g 0  . fluticasone (FLONASE) 50 MCG/ACT nasal spray Place 2 sprays into both nostrils daily. 48 g 0  . folic acid (FOLVITE) 1 MG tablet Take 1 mg by mouth daily. For restless legs    . gabapentin (NEURONTIN) 300 MG capsule TAKE 1 CAPSULE  (300 MG TOTAL) BY MOUTH DAILY. 90 capsule 1  . GINSENG PO Take 1 tablet by mouth daily.    . hydrochlorothiazide (MICROZIDE) 12.5 MG capsule Take 1 capsule (12.5 mg total) by mouth daily. For blood pressure 90 capsule 1  . KLOR-CON M10 10 MEQ tablet TAKE 1 TABLET (10 MEQ TOTAL) BY MOUTH DAILY. 90 tablet 1  . latanoprost (XALATAN) 0.005 % ophthalmic solution Place 1 drop into both eyes at bedtime. 6 mL 1  . magic mouthwash w/lidocaine SOLN Take 5 mLs by mouth 3 (three) times daily as needed for mouth pain. 120 mL 0  . MAGNESIUM CHLORIDE PO Take 1 tablet by mouth daily.    . Multiple Vitamins-Minerals (CENTRUM SILVER 50+WOMEN) TABS Take by mouth.    . pantoprazole (PROTONIX) 40 MG tablet Take 1 tablet (40 mg total) by mouth 2 (two) times daily before a meal. 60 tablet 5  . ranitidine (ZANTAC) 150 MG tablet Take 1 tablet (150 mg total) by mouth at bedtime. 30 tablet 11  . simvastatin (ZOCOR) 10 MG tablet Take 1 tablet (10 mg total) by mouth at bedtime. 90 tablet 3  . vitamin B-12 (CYANOCOBALAMIN) 1000 MCG tablet Take 1 tablet (1,000 mcg total) 3 (three) times a week  by mouth.    . vitamin E 400 UNIT capsule Take 400 Units by mouth daily.    . phenazopyridine (PYRIDIUM) 100 MG tablet Take 1-2 tablets (100-200 mg total) 3 (three) times daily as needed by mouth for pain. (Patient not taking: Reported on 06/19/2017) 30 tablet 1   No facility-administered medications prior to visit.     ROS Review of Systems  Constitutional: Positive for fatigue. Negative for activity change, appetite change, chills and unexpected weight change.  HENT: Negative for congestion, mouth sores and sinus pressure.   Eyes: Negative for visual disturbance.  Respiratory: Negative for cough and chest tightness.   Gastrointestinal: Negative for abdominal pain, constipation and nausea.  Genitourinary: Negative for difficulty urinating, frequency and vaginal pain.  Musculoskeletal: Positive for arthralgias, gait problem, neck  pain and neck stiffness. Negative for back pain.  Skin: Negative for pallor and rash.  Neurological: Positive for weakness. Negative for dizziness, tremors, numbness and headaches.  Psychiatric/Behavioral: Positive for confusion and decreased concentration. Negative for sleep disturbance.  L trap is tender  Objective:  BP 124/78 (BP Location: Left Arm, Patient Position: Sitting, Cuff Size: Normal)   Pulse 86   Temp 97.8 F (36.6 C) (Oral)   Ht 5\' 3"  (1.6 m)   Wt 133 lb (60.3 kg)   SpO2 98%   BMI 23.56 kg/m   BP Readings from Last 3 Encounters:  07/25/17 124/78  06/26/17 136/72  06/19/17 126/70    Wt Readings from Last 3 Encounters:  07/25/17 133 lb (60.3 kg)  06/19/17 133 lb (60.3 kg)  06/12/17 133 lb (60.3 kg)    Physical Exam  Constitutional: She appears well-developed. No distress.  HENT:  Head: Normocephalic.  Right Ear: External ear normal.  Left Ear: External ear normal.  Nose: Nose normal.  Mouth/Throat: Oropharynx is clear and moist.  Eyes: Conjunctivae are normal. Pupils are equal, round, and reactive to light. Right eye exhibits no discharge. Left eye exhibits no discharge.  Neck: Normal range of motion. Neck supple. No JVD present. No tracheal deviation present. No thyromegaly present.  Cardiovascular: Normal rate, regular rhythm and normal heart sounds.  Pulmonary/Chest: No stridor. No respiratory distress. She has no wheezes.  Abdominal: Soft. Bowel sounds are normal. She exhibits no distension and no mass. There is no tenderness. There is no rebound and no guarding.  Musculoskeletal: She exhibits tenderness. She exhibits no edema.  Lymphadenopathy:    She has no cervical adenopathy.  Neurological: She displays normal reflexes. No cranial nerve deficit. She exhibits normal muscle tone. Coordination abnormal.  Skin: No rash noted. No erythema.  Psychiatric: She has a normal mood and affect. Her behavior is normal. Judgment and thought content normal.       Procedure Note :    Trigger Point Injection: L trap   Indication : Focal tender area identifiable by the location without other identifiable neurologic or musculoskeletal finding or pathology.   Risks including unsuccessful procedure , bleeding, infection, bruising, skin atrophy and others were explained to the patient in detail as well as the benefits. Informed consent was obtained and signed.   Tthe patient was placed in a comfortable position.  4  points of maximum tenderness over L paraspinal and trapezius muscles were marked and  the skin was prepped with Betadine and alcohol. 1 inch 25-gauge needle was used. The needle was advanced perpendicular to the skin. Each trigger point was injected with 1 mL of 2% lidocaine and 10 mg of Depo-Medrol in a usual  fashion.  Band-Aids applied.   Tolerated well. Complications: None. Good pain relief following the procedure.  Lab Results  Component Value Date   WBC 7.7 06/12/2017   HGB 14.6 06/12/2017   HCT 42.5 06/12/2017   PLT 144.0 (L) 06/12/2017   GLUCOSE 181 (H) 06/12/2017   CHOL 191 10/01/2014   TRIG 72.0 10/01/2014   HDL 57.80 10/01/2014   LDLCALC 119 (H) 10/01/2014   ALT 15 11/23/2016   AST 21 11/23/2016   NA 132 (L) 06/12/2017   K 4.0 06/12/2017   CL 97 06/12/2017   CREATININE 0.81 06/12/2017   BUN 21 06/12/2017   CO2 28 06/12/2017   TSH 3.38 12/03/2015   INR 1.05 05/10/2016   HGBA1C  10/07/2008    5.9 (NOTE)   The ADA recommends the following therapeutic goal for glycemic   control related to Hgb A1C measurement:   Goal of Therapy:   < 7.0% Hgb A1C   Reference: American Diabetes Association: Clinical Practice   Recommendations 2008, Diabetes Care,  2008, 31:(Suppl 1).   MICROALBUR 0.4 07/14/2009    Dg Chest 2 View  Result Date: 06/22/2017 CLINICAL DATA:  TB screening EXAM: CHEST  2 VIEW COMPARISON:  10/07/2008, 05/30/2016, 05/01/2017 FINDINGS: There is right apical scarring unchanged compared with 05/30/2016. There  is no focal parenchymal opacity. There is no pleural effusion or pneumothorax. The heart and mediastinal contours are unremarkable. The osseous structures are unremarkable. IMPRESSION: No active cardiopulmonary disease. Electronically Signed   By: Kathreen Devoid   On: 06/22/2017 15:34    Assessment & Plan:   There are no diagnoses linked to this encounter. I have discontinued Rito Ehrlich. Fowle's phenazopyridine. I am also having her maintain her aspirin, acetaminophen, folic acid, conjugated estrogens, cromolyn, cyanocobalamin, Ascorbic Acid (VITAMIN C PO), GINSENG PO, Bioflavonoid Products (GRAPE SEED PO), MAGNESIUM CHLORIDE PO, fluticasone, Vitamin D3, ranitidine, CENTRUM SILVER 50+WOMEN, vitamin E, simvastatin, amLODipine, gabapentin, KLOR-CON M10, Erythromycin, pantoprazole, CVS ALLERGY RELIEF, vitamin B-12, B-D INS SYRINGE 0.5CC/30GX1/2", latanoprost, ciprofloxacin, clotrimazole-betamethasone, hydrochlorothiazide, and magic mouthwash w/lidocaine.  No orders of the defined types were placed in this encounter.    Follow-up: No Follow-up on file.  Walker Kehr, MD

## 2017-07-25 NOTE — Addendum Note (Signed)
Addended by: Karren Cobble on: 07/25/2017 11:42 AM   Modules accepted: Orders

## 2017-07-25 NOTE — Assessment & Plan Note (Signed)
Labs

## 2017-07-25 NOTE — Assessment & Plan Note (Signed)
[place]

## 2017-07-25 NOTE — Assessment & Plan Note (Signed)
Start Dexilant

## 2017-07-25 NOTE — Assessment & Plan Note (Addendum)
Poss dementia is worse - start Aricept

## 2017-07-26 ENCOUNTER — Telehealth: Payer: Self-pay | Admitting: Internal Medicine

## 2017-07-26 LAB — CULTURE, URINE COMPREHENSIVE
MICRO NUMBER:: 90003152
SPECIMEN QUALITY: ADEQUATE

## 2017-07-26 NOTE — Telephone Encounter (Signed)
Copied from [place] [phone]. Topic: General - Other >> Jul 26, 2017  9:37 AM Darl Householder, RMA wrote: Reason for CRM: patient is requesting a call back from Dr. Alain Marion or CMA concerning cortisone shot yesterday patient is still experiencing pain

## 2017-07-26 NOTE — Telephone Encounter (Signed)
Pt notified at can take at least 24 hours or more to take full effect. Pt will call back tomorrow if she does not feel any relief.

## 2017-07-27 ENCOUNTER — Telehealth: Payer: Self-pay | Admitting: Internal Medicine

## 2017-07-27 NOTE — Telephone Encounter (Signed)
Okay for verbal orders. 

## 2017-07-27 NOTE — Telephone Encounter (Signed)
Verbals given, FYI 

## 2017-07-27 NOTE — Telephone Encounter (Signed)
Copied from [place]. Topic: Inquiry >> Jul 27, 2017  2:02 PM Conception Chancy, NT wrote: Reason for CRM: [name] from [facility] is requesting verbal orders for speech therapy 1x a week for 4 weeks. Start Jan 7th 2019  [name] [phone]

## 2017-07-27 NOTE — Telephone Encounter (Signed)
Please advise 

## 2017-07-27 NOTE — Telephone Encounter (Signed)
Copied from [place] [phone]. Topic: Quick Communication - See Telephone Encounter >> Jul 27, 2017  9:33 AM Arletha Grippe wrote: CRM for notification. See Telephone encounter for:   07/27/17. Pt is still having pain in her left shoulder and is also still having trouble with her acid reflux. The medication is not helping. She would like to have a different rx called in.  She did not want to make an appt.  [facility] [address] Cb# [phone]

## 2017-07-29 NOTE — Telephone Encounter (Signed)
Increase Dexilant to bid pls Thx

## 2017-07-30 ENCOUNTER — Telehealth: Payer: Self-pay | Admitting: Internal Medicine

## 2017-07-30 NOTE — Telephone Encounter (Signed)
See other TE.

## 2017-07-30 NOTE — Telephone Encounter (Signed)
Copied from [place]. Topic: Quick Communication - See Telephone Encounter >> Jul 30, 2017  8:52 AM Robina Ade, Helene Kelp D wrote: CRM for notification. See Telephone encounter for: 07/30/17. Patient called and would like to let the provider know that the medication Dexlansoprazole (DEXILANT) 30 MG capsule is not working for her. She would like to talk to someone about that.

## 2017-07-30 NOTE — Telephone Encounter (Signed)
Pt.notified

## 2017-07-31 ENCOUNTER — Inpatient Hospital Stay: Payer: Medicare Other

## 2017-07-31 ENCOUNTER — Inpatient Hospital Stay: Payer: Medicare Other | Attending: Oncology

## 2017-07-31 ENCOUNTER — Ambulatory Visit: Payer: Medicare Other

## 2017-07-31 VITALS — BP 138/74 | HR 59 | Temp 97.8°F | Resp 16 | Ht 63.0 in | Wt 132.0 lb

## 2017-07-31 DIAGNOSIS — Z79899 Other long term (current) drug therapy: Secondary | ICD-10-CM | POA: Diagnosis not present

## 2017-07-31 DIAGNOSIS — C9110 Chronic lymphocytic leukemia of B-cell type not having achieved remission: Secondary | ICD-10-CM | POA: Insufficient documentation

## 2017-07-31 DIAGNOSIS — N390 Urinary tract infection, site not specified: Secondary | ICD-10-CM

## 2017-07-31 LAB — URINALYSIS, COMPLETE (UACMP) WITH MICROSCOPIC
Bacteria, UA: NONE SEEN
Bilirubin Urine: NEGATIVE
GLUCOSE, UA: NEGATIVE mg/dL
HGB URINE DIPSTICK: NEGATIVE
Ketones, ur: NEGATIVE mg/dL
Leukocytes, UA: NEGATIVE
NITRITE: NEGATIVE
Protein, ur: NEGATIVE mg/dL
SPECIFIC GRAVITY, URINE: 1.012 (ref 1.005–1.030)
pH: 6 (ref 5.0–8.0)

## 2017-07-31 MED ORDER — IMMUNE GLOBULIN (HUMAN) 10 GM/100ML IV SOLN
30.0000 g | Freq: Once | INTRAVENOUS | Status: AC
  Administered 2017-07-31: 30 g via INTRAVENOUS
  Filled 2017-07-31: qty 100

## 2017-07-31 MED ORDER — DIPHENHYDRAMINE HCL 25 MG PO CAPS
ORAL_CAPSULE | ORAL | Status: AC
  Filled 2017-07-31: qty 1

## 2017-07-31 MED ORDER — SODIUM CHLORIDE 0.9 % IV SOLN
INTRAVENOUS | Status: DC
  Administered 2017-07-31: 10:00:00 via INTRAVENOUS

## 2017-07-31 MED ORDER — DIPHENHYDRAMINE HCL 25 MG PO CAPS
25.0000 mg | ORAL_CAPSULE | Freq: Once | ORAL | Status: AC
  Administered 2017-07-31: 25 mg via ORAL

## 2017-07-31 NOTE — Patient Instructions (Signed)

## 2017-07-31 NOTE — Progress Notes (Signed)
Received call from [facility] in registration that the pt expressed that she feels like she has a UTI this morning, and was wondering if Dr. Benay Spice would like to do lab work to check. Spoke with Ned Card, NP who ordered a urinalysis and culture. Lab appointment added by Maudie Mercury in phlebotomy. Pt to provide sample in infusion.

## 2017-08-01 ENCOUNTER — Telehealth: Payer: Self-pay | Admitting: Internal Medicine

## 2017-08-01 ENCOUNTER — Telehealth: Payer: Self-pay | Admitting: *Deleted

## 2017-08-01 ENCOUNTER — Ambulatory Visit: Payer: Self-pay | Admitting: *Deleted

## 2017-08-01 NOTE — Telephone Encounter (Signed)
Message from pt requesting UA result. Reviewed with Ned Card, NP: UA looks OK. Await culture. Pt informed, voiced understanding.

## 2017-08-01 NOTE — Telephone Encounter (Signed)
Copied from [place] [phone]. Topic: Quick Communication - See Telephone Encounter >> Aug 01, 2017 10:58 AM Synthia Innocent wrote: CRM for notification. See Telephone encounter for:  Patient has taken milk of magnesia, it gave her diarrhea. Is something else she can take that will help her have a BM, but not diarrhea.  08/01/17.

## 2017-08-01 NOTE — Telephone Encounter (Signed)
Pt called and states she needs something to help her have a BM without having diarrhea. Pt states she took [place] and experienced bad diarrhea that ran down her leg.Pt denies being constipated but states she was told by Dr. Alain Marion that she needed to take a laxative to avoid fecal impaction, which she had in the past. Explained to the pt that she could take either a stool softener daily,Metamucil daily or Miralax as needed to help produce bowel movements per home care advice.Pt verbalized understanding.Also explained to the pt that she could try prune juice, which she states she already drinks.   Reason for Disposition . Mild constipation  Protocols used: CONSTIPATION-A-AH

## 2017-08-01 NOTE — Telephone Encounter (Signed)
See triage note.

## 2017-08-02 ENCOUNTER — Telehealth: Payer: Self-pay | Admitting: *Deleted

## 2017-08-02 ENCOUNTER — Telehealth: Payer: Self-pay | Admitting: Internal Medicine

## 2017-08-02 LAB — URINE CULTURE: Culture: NO GROWTH

## 2017-08-02 NOTE — Telephone Encounter (Signed)
Please advise 

## 2017-08-02 NOTE — Telephone Encounter (Signed)
Copied from [place] [phone]. Topic: Quick Communication - See Telephone Encounter >> Aug 02, 2017  4:24 PM Boyd Kerbs wrote: CRM for notification. See Telephone encounter for:   Otila Kluver from [facility] [phone] asking for Verbal Order for OT evaluation.   08/02/17.

## 2017-08-02 NOTE — Telephone Encounter (Signed)
Pt notified of urine culture results. No further concerns.

## 2017-08-02 NOTE — Telephone Encounter (Signed)
Copied from [place]. Topic: Inquiry >> Aug 02, 2017  8:00 AM Scherrie Gerlach wrote: Reason for CRM: pt states her acid reflux is not better after dr plotnikov advised her to take 2 Dexlansoprazole (DEXILANT) 30 MG capsule (1 in am and 1 in pm) Pt just started this 07/25/2017.  Pt still has burning in throat and all the symptoms as before. Pt states not any better. Would like to know what dr would have her do. [id number] [address], [place]. [phone] (Phone) [phone] (Fax)

## 2017-08-02 NOTE — Telephone Encounter (Signed)
-----   Message from Owens Shark, NP sent at 08/02/2017 11:16 AM EST ----- Please let her know the urine culture returned negative.

## 2017-08-03 NOTE — Telephone Encounter (Deleted)
Copied from [place] [phone]. Topic: General - Other >> Aug 03, 2017  8:17 AM Yvette Rack wrote: Reason for CRM: patient calling stating that the Dexlansoprazole ( [place]) 30 MG capsule isn't working for her Jerrye Bushy and would like something else she has acid reflex really bad for the last 2 weeks please called RX into her pharmacy   [id number] [address], [place]. [phone] (Phone) [phone] (Fax)

## 2017-08-03 NOTE — Telephone Encounter (Signed)
Called Tina  Back no answer LMOM w/verbal OK per MD.../lmb

## 2017-08-03 NOTE — Telephone Encounter (Signed)
Ms Tech should make an appt w/Dr Fuller Plan (GI) Thx

## 2017-08-06 DIAGNOSIS — E559 Vitamin D deficiency, unspecified: Secondary | ICD-10-CM | POA: Diagnosis not present

## 2017-08-06 DIAGNOSIS — Z7982 Long term (current) use of aspirin: Secondary | ICD-10-CM

## 2017-08-06 DIAGNOSIS — K5900 Constipation, unspecified: Secondary | ICD-10-CM | POA: Diagnosis not present

## 2017-08-06 DIAGNOSIS — Z9181 History of falling: Secondary | ICD-10-CM

## 2017-08-06 DIAGNOSIS — R32 Unspecified urinary incontinence: Secondary | ICD-10-CM | POA: Diagnosis not present

## 2017-08-06 DIAGNOSIS — M6281 Muscle weakness (generalized): Secondary | ICD-10-CM | POA: Diagnosis not present

## 2017-08-06 DIAGNOSIS — Z87440 Personal history of urinary (tract) infections: Secondary | ICD-10-CM

## 2017-08-06 DIAGNOSIS — K5790 Diverticulosis of intestine, part unspecified, without perforation or abscess without bleeding: Secondary | ICD-10-CM | POA: Diagnosis not present

## 2017-08-06 DIAGNOSIS — G2581 Restless legs syndrome: Secondary | ICD-10-CM | POA: Diagnosis not present

## 2017-08-06 DIAGNOSIS — I10 Essential (primary) hypertension: Secondary | ICD-10-CM | POA: Diagnosis not present

## 2017-08-06 DIAGNOSIS — G629 Polyneuropathy, unspecified: Secondary | ICD-10-CM | POA: Diagnosis not present

## 2017-08-06 DIAGNOSIS — M810 Age-related osteoporosis without current pathological fracture: Secondary | ICD-10-CM | POA: Diagnosis not present

## 2017-08-06 DIAGNOSIS — K219 Gastro-esophageal reflux disease without esophagitis: Secondary | ICD-10-CM

## 2017-08-06 DIAGNOSIS — H409 Unspecified glaucoma: Secondary | ICD-10-CM | POA: Diagnosis not present

## 2017-08-06 DIAGNOSIS — D519 Vitamin B12 deficiency anemia, unspecified: Secondary | ICD-10-CM | POA: Diagnosis not present

## 2017-08-06 DIAGNOSIS — C9190 Lymphoid leukemia, unspecified not having achieved remission: Secondary | ICD-10-CM | POA: Diagnosis not present

## 2017-08-06 DIAGNOSIS — E785 Hyperlipidemia, unspecified: Secondary | ICD-10-CM

## 2017-08-07 ENCOUNTER — Ambulatory Visit (INDEPENDENT_AMBULATORY_CARE_PROVIDER_SITE_OTHER): Payer: Medicare Other | Admitting: Internal Medicine

## 2017-08-07 ENCOUNTER — Encounter: Payer: Self-pay | Admitting: Internal Medicine

## 2017-08-07 ENCOUNTER — Encounter: Payer: Self-pay | Admitting: Gastroenterology

## 2017-08-07 DIAGNOSIS — K146 Glossodynia: Secondary | ICD-10-CM

## 2017-08-07 DIAGNOSIS — R269 Unspecified abnormalities of gait and mobility: Secondary | ICD-10-CM

## 2017-08-07 DIAGNOSIS — K219 Gastro-esophageal reflux disease without esophagitis: Secondary | ICD-10-CM

## 2017-08-07 DIAGNOSIS — Z862 Personal history of diseases of the blood and blood-forming organs and certain disorders involving the immune mechanism: Secondary | ICD-10-CM | POA: Diagnosis not present

## 2017-08-07 MED ORDER — SUCRALFATE 1 G PO TABS: 1.0000 g | ORAL_TABLET | Freq: Three times a day (TID) | ORAL | 5 refills | Status: DC

## 2017-08-07 NOTE — Assessment & Plan Note (Addendum)
Refractory sx's GI appt is scheduled Try Carafate D/c supplements, ie grape seed extract and others

## 2017-08-07 NOTE — Assessment & Plan Note (Signed)
No change Hope Carafate will help GI appt pending

## 2017-08-07 NOTE — Progress Notes (Signed)
Subjective:  Patient ID: [name], female    DOB: 07-15-1926  Age: 84 y.o. MRN: [id number]  CC: No chief complaint on file.   HPI SHANEQUA WHITENIGHT presents for GERD - not better on meds. C/o throat burning, mouth burning... F/u neck pain - better w/trigger point inj   Outpatient Medications Prior to Visit  Medication Sig Dispense Refill  . acetaminophen (TYLENOL) 500 MG tablet Take 500 mg by mouth 2 (two) times daily.     Marland Kitchen amLODipine (NORVASC) 2.5 MG tablet Take 1 tablet (2.5 mg total) by mouth daily. 90 tablet 3  . Ascorbic Acid (VITAMIN C PO) Take 1 tablet by mouth daily.    Marland Kitchen aspirin 81 MG EC tablet Take 81 mg by mouth daily.      . B-D INS SYRINGE 0.5CC/30GX1/2" 30G X 1/2" 0.5 ML MISC USE TO ADMINISTER B12 INJECTIONS 10 each 1  . Bioflavonoid Products (GRAPE SEED PO) Take 1 tablet by mouth daily.    . Cholecalciferol (VITAMIN D3) 2000 units capsule Take 1 capsule (2,000 Units total) by mouth daily. 100 capsule 3  . clotrimazole-betamethasone (LOTRISONE) cream Apply to affected lower vaginal area, 2 times daily, until healed 30 g 0  . conjugated estrogens (PREMARIN) vaginal cream 1/2 gram vaginally every night x 1 week, then twice weekly at bedtime 90 g 3  . cromolyn (OPTICROM) 4 % ophthalmic solution 1 DROP IN BOTH EYES FOUR TIMES A DAY  12  . [facility] ALLERGY RELIEF 10 MG tablet TAKE 1 TABLET (10 MG TOTAL) BY MOUTH DAILY. 90 tablet 0  . cyanocobalamin (,VITAMIN B-12,) 1000 MCG/ML injection ADMINISTER 1 CC SUBQ EVERY 4 WEEKS AS DIRECTED 10 mL 0  . Dexlansoprazole (DEXILANT) 30 MG capsule Take 1 capsule (30 mg total) by mouth daily. 90 capsule 3  . donepezil (ARICEPT) 5 MG tablet Take 1 tablet (5 mg total) by mouth at bedtime. 30 tablet 5  . Erythromycin 2 % ointment Apply to affected area 2 times daily 25 g 0  . fluticasone (FLONASE) 50 MCG/ACT nasal spray Place 2 sprays into both nostrils daily. 48 g 0  . folic acid (FOLVITE) 1 MG tablet Take 1 mg by mouth daily. For restless legs     . gabapentin (NEURONTIN) 300 MG capsule TAKE 1 CAPSULE (300 MG TOTAL) BY MOUTH DAILY. 90 capsule 1  . GINSENG PO Take 1 tablet by mouth daily.    . hydrochlorothiazide (MICROZIDE) 12.5 MG capsule Take 1 capsule (12.5 mg total) by mouth daily. For blood pressure 90 capsule 1  . KLOR-CON M10 10 MEQ tablet TAKE 1 TABLET (10 MEQ TOTAL) BY MOUTH DAILY. 90 tablet 1  . latanoprost (XALATAN) 0.005 % ophthalmic solution Place 1 drop into both eyes at bedtime. 6 mL 1  . magic mouthwash w/lidocaine SOLN Take 5 mLs by mouth 3 (three) times daily as needed for mouth pain. 120 mL 0  . MAGNESIUM CHLORIDE PO Take 1 tablet by mouth daily.    . Multiple Vitamins-Minerals (CENTRUM SILVER 50+WOMEN) TABS Take by mouth.    . ranitidine (ZANTAC) 150 MG tablet Take 1 tablet (150 mg total) by mouth at bedtime. 30 tablet 11  . simvastatin (ZOCOR) 10 MG tablet Take 1 tablet (10 mg total) by mouth at bedtime. 90 tablet 3  . vitamin B-12 (CYANOCOBALAMIN) 1000 MCG tablet Take 1 tablet (1,000 mcg total) 3 (three) times a week by mouth.    . vitamin E 400 UNIT capsule Take 400 Units by mouth  daily.     No facility-administered medications prior to visit.     ROS Review of Systems  Constitutional: Negative for activity change, appetite change, chills, fatigue and unexpected weight change.  HENT: Positive for sore throat. Negative for congestion, mouth sores and sinus pressure.   Eyes: Negative for visual disturbance.  Respiratory: Negative for cough and chest tightness.   Gastrointestinal: Positive for abdominal pain and constipation. Negative for abdominal distention and nausea.  Genitourinary: Negative for difficulty urinating, frequency and vaginal pain.  Musculoskeletal: Negative for back pain and gait problem.  Skin: Negative for pallor and rash.  Neurological: Negative for dizziness, tremors, weakness, numbness and headaches.  Psychiatric/Behavioral: Negative for confusion and sleep disturbance.    Objective:    BP 126/68 (BP Location: Left Arm, Patient Position: Sitting, Cuff Size: Normal)   Pulse 76   Temp 97.7 F (36.5 C) (Oral)   Ht 5\' 3"  (1.6 m)   Wt 130 lb (59 kg)   SpO2 98%   BMI 23.03 kg/m   BP Readings from Last 3 Encounters:  08/07/17 126/68  07/31/17 138/74  07/25/17 124/78    Wt Readings from Last 3 Encounters:  08/07/17 130 lb (59 kg)  07/31/17 132 lb (59.9 kg)  07/25/17 133 lb (60.3 kg)    Physical Exam  Constitutional: She appears well-developed. No distress.  HENT:  Head: Normocephalic.  Right Ear: External ear normal.  Left Ear: External ear normal.  Nose: Nose normal.  Mouth/Throat: Oropharynx is clear and moist.  Eyes: Conjunctivae are normal. Pupils are equal, round, and reactive to light. Right eye exhibits no discharge. Left eye exhibits no discharge.  Neck: Normal range of motion. Neck supple. No JVD present. No tracheal deviation present. No thyromegaly present.  Cardiovascular: Normal rate, regular rhythm and normal heart sounds.  Pulmonary/Chest: No stridor. No respiratory distress. She has no wheezes.  Abdominal: Soft. Bowel sounds are normal. She exhibits no distension and no mass. There is no tenderness. There is no rebound and no guarding.  Musculoskeletal: She exhibits no edema or tenderness.  Lymphadenopathy:    She has no cervical adenopathy.  Neurological: She displays normal reflexes. No cranial nerve deficit. She exhibits normal muscle tone. Coordination normal.  Skin: No rash noted. No erythema.  Psychiatric: She has a normal mood and affect. Her behavior is normal. Judgment and thought content normal.  no thrush  Lab Results  Component Value Date   WBC 7.7 06/12/2017   HGB 14.6 06/12/2017   HCT 42.5 06/12/2017   PLT 144.0 (L) 06/12/2017   GLUCOSE 181 (H) 06/12/2017   CHOL 191 10/01/2014   TRIG 72.0 10/01/2014   HDL 57.80 10/01/2014   LDLCALC 119 (H) 10/01/2014   ALT 15 11/23/2016   AST 21 11/23/2016   NA 132 (L) 06/12/2017   K  4.0 06/12/2017   CL 97 06/12/2017   CREATININE 0.81 06/12/2017   BUN 21 06/12/2017   CO2 28 06/12/2017   TSH 3.38 12/03/2015   INR 1.05 05/10/2016   HGBA1C  10/07/2008    5.9 (NOTE)   The ADA recommends the following therapeutic goal for glycemic   control related to Hgb A1C measurement:   Goal of Therapy:   < 7.0% Hgb A1C   Reference: American Diabetes Association: Clinical Practice   Recommendations 2008, Diabetes Care,  2008, 31:(Suppl 1).   MICROALBUR 0.4 07/14/2009    Dg Chest 2 View  Result Date: 06/22/2017 CLINICAL DATA:  TB screening EXAM: CHEST  2 VIEW  COMPARISON:  10/07/2008, 05/30/2016, 05/01/2017 FINDINGS: There is right apical scarring unchanged compared with 05/30/2016. There is no focal parenchymal opacity. There is no pleural effusion or pneumothorax. The heart and mediastinal contours are unremarkable. The osseous structures are unremarkable. IMPRESSION: No active cardiopulmonary disease. Electronically Signed   By: Kathreen Devoid   On: 06/22/2017 15:34    Assessment & Plan:   There are no diagnoses linked to this encounter. I am having Rito Ehrlich. Planck maintain her aspirin, acetaminophen, folic acid, conjugated estrogens, cromolyn, cyanocobalamin, Ascorbic Acid (VITAMIN C PO), GINSENG PO, Bioflavonoid Products (GRAPE SEED PO), MAGNESIUM CHLORIDE PO, fluticasone, Vitamin D3, ranitidine, CENTRUM SILVER 50+WOMEN, vitamin E, simvastatin, amLODipine, gabapentin, KLOR-CON M10, Erythromycin, CVS ALLERGY RELIEF, vitamin B-12, B-D INS SYRINGE 0.5CC/30GX1/2", latanoprost, clotrimazole-betamethasone, hydrochlorothiazide, magic mouthwash w/lidocaine, donepezil, and Dexlansoprazole.  No orders of the defined types were placed in this encounter.    Follow-up: No Follow-up on file.  Walker Kehr, MD

## 2017-08-07 NOTE — Assessment & Plan Note (Signed)
On B12 

## 2017-08-07 NOTE — Telephone Encounter (Signed)
Pt notified, but already made an appt for today and wanted to keep it

## 2017-08-07 NOTE — Assessment & Plan Note (Signed)
[place]

## 2017-08-08 ENCOUNTER — Ambulatory Visit: Payer: Self-pay | Admitting: *Deleted

## 2017-08-08 NOTE — Telephone Encounter (Signed)
Pt questioning if she should take both the Carafate and continue the dexlansoprazole.  Reviewed pts meds and notes from Dr. Alain Marion; pt instructed to take both medications as ordered. Reason for Disposition . Caller has medication question only, adult not sick, and triager answers question  Protocols used: MEDICATION QUESTION CALL-A-AH

## 2017-08-09 ENCOUNTER — Encounter: Payer: Self-pay | Admitting: Sports Medicine

## 2017-08-09 ENCOUNTER — Ambulatory Visit (INDEPENDENT_AMBULATORY_CARE_PROVIDER_SITE_OTHER): Payer: Medicare Other | Admitting: Sports Medicine

## 2017-08-09 DIAGNOSIS — R413 Other amnesia: Secondary | ICD-10-CM | POA: Diagnosis not present

## 2017-08-09 DIAGNOSIS — I739 Peripheral vascular disease, unspecified: Secondary | ICD-10-CM | POA: Diagnosis not present

## 2017-08-09 DIAGNOSIS — B351 Tinea unguium: Secondary | ICD-10-CM

## 2017-08-09 DIAGNOSIS — M79674 Pain in right toe(s): Secondary | ICD-10-CM

## 2017-08-09 DIAGNOSIS — M79675 Pain in left toe(s): Secondary | ICD-10-CM | POA: Diagnosis not present

## 2017-08-09 NOTE — Progress Notes (Signed)
Subjective: [name] is a 84 y.o. female patient seen today in office with complaint of painful thickened and elongated toenails; unable to trim. Patient denies history of Known Diabetes, Neuropathy, or Vascular disease however is assisted by friend. Reports issues with memory. Patient has no other pedal complaints at this time.   Review of Systems  All other systems reviewed and are negative.   Patient Active Problem List   Diagnosis Date Noted  . Memory loss 07/25/2017  . Neck pain on left side 07/25/2017  . Urinary tract infection, recurrent 06/12/2017  . Thrush 02/10/2017  . Frequent urination 02/10/2017  . Osteoporosis 11/23/2016  . Abdominal pain 08/16/2016  . Urinary frequency 08/16/2016  . Peripheral edema 08/16/2016  . General weakness 08/16/2016  . Gait disorder 08/16/2016  . Dysuria 08/03/2016  . Syncope and collapse 05/10/2016  . Closed displaced simple supracondylar fracture of right humerus without intercondylar fracture 05/10/2016  . Urinary tract infection without hematuria 05/10/2016  . Syncope 05/10/2016  . Humeral head fracture, right, closed, initial encounter   . Burning tongue 03/25/2016  . Post concussion syndrome 03/03/2016  . Fall 03/03/2016  . Cystocele 03/05/2015  . Dehydration 02/08/2015  . Hyponatremia 02/08/2015  . Well adult exam 10/01/2014  . Grief at loss of child 12/11/2013  . Irregular heart beat 09/29/2013  . Humeral head fracture 08/26/2013  . Actinic keratoses 08/26/2013  . Carotid bruit 05/02/2012  . Anxiety 03/17/2011  . ANEMIA, PERNICIOUS, HX OF 10/10/2007  . Peripheral neuropathy ([place]) 09/04/2007  . Chronic fatigue 08/06/2007  . Chronic lymphocytic leukemia ([place]) 06/17/2007  . Allergic rhinitis 06/17/2007  . OSTEOARTHRITIS 06/17/2007  . B12 deficiency 06/07/2007  . GERD (gastroesophageal reflux disease) 04/30/2007  . Dyslipidemia 12/19/2006  . Essential hypertension 12/19/2006  . DIVERTICULOSIS, COLON 05/19/2003  .  COLONIC POLYPS, HX OF 01/10/2000    Current Outpatient Medications on File Prior to Visit  Medication Sig Dispense Refill  . acetaminophen (TYLENOL) 500 MG tablet Take 500 mg by mouth 2 (two) times daily.     Marland Kitchen amLODipine (NORVASC) 2.5 MG tablet Take 1 tablet (2.5 mg total) by mouth daily. 90 tablet 3  . aspirin 81 MG EC tablet Take 81 mg by mouth daily.      . B-D INS SYRINGE 0.5CC/30GX1/2" 30G X 1/2" 0.5 ML MISC USE TO ADMINISTER B12 INJECTIONS 10 each 1  . Bioflavonoid Products (GRAPE SEED PO) Take 1 tablet by mouth daily.    . Cholecalciferol (VITAMIN D3) 2000 units capsule Take 1 capsule (2,000 Units total) by mouth daily. 100 capsule 3  . clotrimazole-betamethasone (LOTRISONE) cream Apply to affected lower vaginal area, 2 times daily, until healed 30 g 0  . conjugated estrogens (PREMARIN) vaginal cream 1/2 gram vaginally every night x 1 week, then twice weekly at bedtime 90 g 3  . cromolyn (OPTICROM) 4 % ophthalmic solution 1 DROP IN BOTH EYES FOUR TIMES A DAY  12  . CVS ALLERGY RELIEF 10 MG tablet TAKE 1 TABLET (10 MG TOTAL) BY MOUTH DAILY. 90 tablet 0  . cyanocobalamin (,VITAMIN B-12,) 1000 MCG/ML injection ADMINISTER 1 CC SUBQ EVERY 4 WEEKS AS DIRECTED 10 mL 0  . Dexlansoprazole (DEXILANT) 30 MG capsule Take 1 capsule (30 mg total) by mouth daily. 90 capsule 3  . donepezil (ARICEPT) 5 MG tablet Take 1 tablet (5 mg total) by mouth at bedtime. 30 tablet 5  . Erythromycin 2 % ointment Apply to affected area 2 times daily 25 g 0  .  fluticasone (FLONASE) 50 MCG/ACT nasal spray Place 2 sprays into both nostrils daily. 48 g 0  . folic acid (FOLVITE) 1 MG tablet Take 1 mg by mouth daily. For restless legs    . gabapentin (NEURONTIN) 300 MG capsule TAKE 1 CAPSULE (300 MG TOTAL) BY MOUTH DAILY. 90 capsule 1  . GINSENG PO Take 1 tablet by mouth daily.    . hydrochlorothiazide (MICROZIDE) 12.5 MG capsule Take 1 capsule (12.5 mg total) by mouth daily. For blood pressure 90 capsule 1  . KLOR-CON  M10 10 MEQ tablet TAKE 1 TABLET (10 MEQ TOTAL) BY MOUTH DAILY. 90 tablet 1  . latanoprost (XALATAN) 0.005 % ophthalmic solution Place 1 drop into both eyes at bedtime. 6 mL 1  . MAGNESIUM CHLORIDE PO Take 1 tablet by mouth daily.    . Multiple Vitamins-Minerals (CENTRUM SILVER 50+WOMEN) TABS Take by mouth.    . ranitidine (ZANTAC) 150 MG tablet Take 1 tablet (150 mg total) by mouth at bedtime. 30 tablet 11  . simvastatin (ZOCOR) 10 MG tablet Take 1 tablet (10 mg total) by mouth at bedtime. 90 tablet 3  . sucralfate (CARAFATE) 1 g tablet Take 1 tablet (1 g total) by mouth 4 (four) times daily -  with meals and at bedtime. 120 tablet 5  . vitamin B-12 (CYANOCOBALAMIN) 1000 MCG tablet Take 1 tablet (1,000 mcg total) 3 (three) times a week by mouth.     No current facility-administered medications on file prior to visit.     Allergies  Allergen Reactions  . Pneumococcal Vaccines Swelling and Rash    Pneumococcal Vaccine-23 valent  . Clarithromycin Other (See Comments)    REACTION: sore mouth  . Hctz [Hydrochlorothiazide]     Low Na  . Oxycodone-Aspirin Other (See Comments)    REACTION: horrible nightmares  . Propoxyphene N-Acetaminophen Other (See Comments)    REACTION: couldn't wake her up    Objective: Physical Exam  General: Well developed, nourished, no acute distress, awake, alert and oriented x 3  Vascular: Dorsalis pedis artery 1/4 bilateral, Posterior tibial artery 1/4 bilateral, skin temperature warm to cool proximal to distal bilateral lower extremities, + varicosities, pedal hair present bilateral.  Neurological: Gross sensation present via light touch bilateral.   Dermatological: Skin is warm, dry, and supple bilateral, Nails 1-10 are tender, long, thick, and discolored with mild subungal debris, no webspace macerations present bilateral, no open lesions present bilateral, no callus/corns/hyperkeratotic tissue present bilateral. No signs of infection  bilateral.  Musculoskeletal: Asymptomatic pes planus and hammertoe boney deformities noted bilateral. Muscular strength within normal limits without painon range of motion. No pain with calf compression bilateral.  Assessment and Plan:  Problem List Items Addressed This Visit      Other   Memory loss    Other Visit Diagnoses    Pain due to onychomycosis of toenails of both feet    -  Primary   PVD (peripheral vascular disease) ([place])          -Examined patient.  -Discussed treatment options for painful mycotic nails. -Mechanically debrided and reduced mycotic nails with sterile nail nipper and dremel nail file without incident. -Patient to return in 3 months for follow up evaluation or sooner if symptoms worsen.  Landis Martins, DPM

## 2017-08-13 ENCOUNTER — Telehealth: Payer: Self-pay | Admitting: Internal Medicine

## 2017-08-13 NOTE — Telephone Encounter (Signed)
Copied from [place] [id number]. Topic: Quick Communication - See Telephone Encounter >> Aug 13, 2017  9:21 AM Conception Chancy, NT wrote: CRM for notification. See Telephone encounter for:  08/13/17.  Patient is calling and states she believes she was supposed to stop taking Ranitadene and she is not sure why she is supposed to stop taking this and when she was supposed to stop. Please advise and contact patient with further information

## 2017-08-15 NOTE — Telephone Encounter (Signed)
Please advise about ranitidine

## 2017-08-15 NOTE — Telephone Encounter (Signed)
Okay to stop ranitidine.  Thank you

## 2017-08-16 ENCOUNTER — Telehealth: Payer: Self-pay | Admitting: Internal Medicine

## 2017-08-16 NOTE — Telephone Encounter (Signed)
Pt.notified

## 2017-08-16 NOTE — Telephone Encounter (Signed)
Copied from [place]. Topic: Quick Communication - See Telephone Encounter >> Aug 16, 2017  4:31 PM Vernona Rieger wrote: CRM for notification. See Telephone encounter for:   08/16/17.  Colletta Maryland RN from [employer] called and stated that the patient has a level 1 interaction between 2 medications.  amLODipine (NORVASC) 2.5 MG tablet & simvastatin (ZOCOR) 10 MG tablet. Se wants to make sure they are ok for her to continue to take.  Call back is [phone]

## 2017-08-17 NOTE — Telephone Encounter (Signed)
Please advise 

## 2017-08-20 NOTE — Telephone Encounter (Signed)
We can replace Zocor for Crestor 5 mg/d Thx

## 2017-08-21 MED ORDER — ROSUVASTATIN CALCIUM 5 MG PO TABS: 5.0000 mg | ORAL_TABLET | Freq: Every day | ORAL | 3 refills | Status: DC

## 2017-08-21 NOTE — Telephone Encounter (Signed)
LM notifying Colletta Maryland [facility] RN)

## 2017-08-22 ENCOUNTER — Telehealth: Payer: Self-pay | Admitting: Internal Medicine

## 2017-08-22 NOTE — Telephone Encounter (Signed)
Copied from [place] [id number]. Topic: Quick Communication - See Telephone Encounter >> Aug 22, 2017 11:01 AM Ether Griffins B wrote: CRM for notification. See Telephone encounter for:  Pt was taking simvastatin and she requested a refill on it and rosuvastatin was called in. She is wanting to know if she needs to take both or discontinue the simvastatin  08/22/17.

## 2017-08-22 NOTE — Telephone Encounter (Signed)
Notified pt that she should not take both. Per her chart yesterday insurance called to inform him of a interaction w/ both simvastatin & amlodipine taking together. So he d/c the simvastatin and ok rosuvastatin. Pt understood changes.Melissa KitchenJohny Chess

## 2017-08-23 ENCOUNTER — Other Ambulatory Visit: Payer: Self-pay | Admitting: Internal Medicine

## 2017-08-28 ENCOUNTER — Encounter: Payer: Self-pay | Admitting: Nurse Practitioner

## 2017-08-28 ENCOUNTER — Ambulatory Visit: Payer: Medicare Other

## 2017-08-28 ENCOUNTER — Telehealth: Payer: Self-pay | Admitting: Nurse Practitioner

## 2017-08-28 ENCOUNTER — Inpatient Hospital Stay: Payer: Medicare Other

## 2017-08-28 ENCOUNTER — Inpatient Hospital Stay: Payer: Medicare Other | Attending: Oncology | Admitting: Nurse Practitioner

## 2017-08-28 VITALS — BP 143/81 | HR 65 | Temp 98.6°F | Resp 16

## 2017-08-28 VITALS — BP 136/68 | HR 67 | Temp 97.9°F | Resp 18 | Ht 63.0 in | Wt 133.2 lb

## 2017-08-28 DIAGNOSIS — K219 Gastro-esophageal reflux disease without esophagitis: Secondary | ICD-10-CM | POA: Diagnosis not present

## 2017-08-28 DIAGNOSIS — C9110 Chronic lymphocytic leukemia of B-cell type not having achieved remission: Secondary | ICD-10-CM | POA: Insufficient documentation

## 2017-08-28 DIAGNOSIS — C9190 Lymphoid leukemia, unspecified not having achieved remission: Secondary | ICD-10-CM | POA: Diagnosis not present

## 2017-08-28 DIAGNOSIS — D6959 Other secondary thrombocytopenia: Secondary | ICD-10-CM | POA: Diagnosis not present

## 2017-08-28 DIAGNOSIS — D801 Nonfamilial hypogammaglobulinemia: Secondary | ICD-10-CM | POA: Insufficient documentation

## 2017-08-28 DIAGNOSIS — N644 Mastodynia: Secondary | ICD-10-CM | POA: Insufficient documentation

## 2017-08-28 DIAGNOSIS — Z79899 Other long term (current) drug therapy: Secondary | ICD-10-CM | POA: Insufficient documentation

## 2017-08-28 DIAGNOSIS — Z87440 Personal history of urinary (tract) infections: Secondary | ICD-10-CM | POA: Insufficient documentation

## 2017-08-28 LAB — CBC WITH DIFFERENTIAL/PLATELET
BASOS ABS: 0 10*3/uL (ref 0.0–0.1)
BASOS PCT: 0 %
EOS ABS: 0.1 10*3/uL (ref 0.0–0.5)
EOS PCT: 3 %
HCT: 41.4 % (ref 34.8–46.6)
HEMOGLOBIN: 14.1 g/dL (ref 11.6–15.9)
Lymphocytes Relative: 36 %
Lymphs Abs: 1.8 10*3/uL (ref 0.9–3.3)
MCH: 31.8 pg (ref 25.1–34.0)
MCHC: 34.1 g/dL (ref 31.5–36.0)
MCV: 93.5 fL (ref 79.5–101.0)
Monocytes Absolute: 0.4 10*3/uL (ref 0.1–0.9)
Monocytes Relative: 7 %
NEUTROS PCT: 54 %
Neutro Abs: 2.7 10*3/uL (ref 1.5–6.5)
PLATELETS: 171 10*3/uL (ref 145–400)
RBC: 4.43 MIL/uL (ref 3.70–5.45)
RDW: 14.2 % (ref 11.2–14.5)
WBC: 5 10*3/uL (ref 3.9–10.3)

## 2017-08-28 MED ORDER — DIPHENHYDRAMINE HCL 25 MG PO CAPS
ORAL_CAPSULE | ORAL | Status: AC
  Filled 2017-08-28: qty 1

## 2017-08-28 MED ORDER — DIPHENHYDRAMINE HCL 25 MG PO CAPS
25.0000 mg | ORAL_CAPSULE | Freq: Once | ORAL | Status: AC
  Administered 2017-08-28: 25 mg via ORAL

## 2017-08-28 MED ORDER — IMMUNE GLOBULIN (HUMAN) 10 GM/100ML IV SOLN
30.0000 g | Freq: Once | INTRAVENOUS | Status: AC
  Administered 2017-08-28: 30 g via INTRAVENOUS
  Filled 2017-08-28: qty 200

## 2017-08-28 NOTE — Progress Notes (Signed)
  [facility] OFFICE PROGRESS NOTE   Diagnosis: CLL  INTERVAL HISTORY:   [name] returns as scheduled.  She continues monthly IVIG.  No interim illnesses or infections.  No fevers or sweats.  She has a good appetite.  She continues to feel "weak".  His main complaint is acid reflux.  She has been referred to gastroenterology.  She has recently noted tenderness at the left upper outer breast and wonders if she feels a "lump".  Objective:  Vital signs in last 24 hours:  Blood pressure 136/68, pulse 67, temperature 97.9 F (36.6 C), temperature source Oral, resp. rate 18, height 5\' 3"  (1.6 m), weight 133 lb 3.2 oz (60.4 kg), SpO2 98 %.    HEENT: No thrush or ulcers. Lymphatics: No palpable cervical, supraclavicular, axillary or inguinal lymph nodes. Resp: Lungs clear bilaterally. Cardio: Regular rate and rhythm. GI: Abdomen soft and nontender.  No hepatosplenomegaly. Vascular: No leg edema. Breasts: No mass palpated in either breast.  Tender upper outer quadrant left breast/chest wall.    Lab Results:  Lab Results  Component Value Date   WBC 5.0 08/28/2017   HGB 14.1 08/28/2017   HCT 41.4 08/28/2017   MCV 93.5 08/28/2017   PLT 171 08/28/2017   NEUTROABS 2.7 08/28/2017    Imaging:  No results found.  Medications: I have reviewed the patient's current medications.  Assessment/Plan: 1. Chronic lymphocytic leukemia, asymptomatic. 2. History of hypogammaglobulinemia with recurrent infections. She continues monthly IVIG replacement therapy. 3. History of recurrent urinary tract infections. 4. History of mild thrombocytopenia secondary to CLL. 5. Question right upper lobe nodule on chest x-ray 05/09/2016; follow-up chest x-ray 09/19/2016 with more confluent irregular nodular density right upper lobe measuring 3 x 1.8 cm. repeat chest x-ray 01/09/2017-regression of irregular nodular density in the right upper lobe felt to  represent interval scarring.  Stable on chest x-ray 05/01/2017    Disposition: [name] appears stable.  Plan to continue monthly IVIG.  At today's visit she reports pain/tenderness upper outer quadrant left breast/chest wall.  She thinks she may feel a lump.  I did not feel a mass on exam.  She is being referred for a mammogram.  She will return for an office visit in 3 months.  She will contact the office in the interim with any problems.  Plan reviewed with Dr. Benay Spice.    Ned Card ANP/GNP-BC   08/28/2017  10:20 AM

## 2017-08-28 NOTE — Telephone Encounter (Signed)
Scheduled appt per 2/5 los - Gave patient AVS and calender per los.  

## 2017-08-28 NOTE — Patient Instructions (Signed)
[facility] Discharge Instructions for Patients Receiving Chemotherapy  Today you received the following chemotherapy agents Privigen.   To help prevent nausea and vomiting after your treatment, we encourage you to take your nausea medication as directed.   If you develop nausea and vomiting that is not controlled by your nausea medication, call the clinic.   BELOW ARE SYMPTOMS THAT SHOULD BE REPORTED IMMEDIATELY:  *FEVER GREATER THAN 100.5 F  *CHILLS WITH OR WITHOUT FEVER  NAUSEA AND VOMITING THAT IS NOT CONTROLLED WITH YOUR NAUSEA MEDICATION  *UNUSUAL SHORTNESS OF BREATH  *UNUSUAL BRUISING OR BLEEDING  TENDERNESS IN MOUTH AND THROAT WITH OR WITHOUT PRESENCE OF ULCERS  *URINARY PROBLEMS  *BOWEL PROBLEMS  UNUSUAL RASH Items with * indicate a potential emergency and should be followed up as soon as possible.  Feel free to call the clinic should you have any questions or concerns. The clinic phone number is [phone].  Please show the [facility] at check-in to the Emergency Department and triage nurse.

## 2017-09-05 ENCOUNTER — Other Ambulatory Visit: Payer: Self-pay | Admitting: *Deleted

## 2017-09-05 NOTE — Telephone Encounter (Signed)
Rec'd fax pt requesting refill on pantoprazole 40 mg. Faxed back denied. Med was d/c 07/25/17 pt should be taking Dexilant instead.Marland KitchenJohny Chess

## 2017-09-06 ENCOUNTER — Other Ambulatory Visit: Payer: Self-pay | Admitting: Internal Medicine

## 2017-09-11 ENCOUNTER — Ambulatory Visit: Payer: Self-pay

## 2017-09-11 NOTE — Telephone Encounter (Signed)
Pt calling with weakness, fatigue and constipation. Pt states the fatigue has gotten worse this week. She states she has bit had a BM is 2 weeks. She said she is having water like stools with brown flecks. Pt is concerned she has a bladder infection. She has a h/o frequent fecal impactions. Appt made for tomorrow with Dr Alain Marion at 2:30 pm.   Reason for Disposition . [1] MODERATE weakness (i.e., interferes with work, school, normal activities) AND [2] persists > 3 days  Answer Assessment - Initial Assessment Questions 1. DESCRIPTION: "Describe how you are feeling."     Weakness and fatigue and shaky 2. SEVERITY: "How bad is it?"  "Can you stand and walk?"   - MILD - Feels weak or tired, but does not interfere with work, school or normal activities   - [place] to stand and walk; weakness interferes with work, school, or normal activities   - SEVERE - Unable to stand or walk     moderate 3. ONSET:  "When did the weakness begin?"    Has been weak but has worsened 4. CAUSE: "What do you think is causing the weakness?"     Thinks its a stool and/or a bladder issue 5. MEDICINES: "Have you recently started a new medicine or had a change in the amount of a medicine?"     no 6. OTHER SYMPTOMS: "Do you have any other symptoms?" (e.g., chest pain, fever, cough, SOB, vomiting, diarrhea, bleeding)     Hurts like she is having bladder infection and having watery stools, last BM was two weeks ago 7. PREGNANCY: "Is there any chance you are pregnant?" "When was your last menstrual period?"     n/a  Protocols used: WEAKNESS (GENERALIZED) AND FATIGUE-A-AH

## 2017-09-12 ENCOUNTER — Ambulatory Visit (INDEPENDENT_AMBULATORY_CARE_PROVIDER_SITE_OTHER): Payer: Medicare Other | Admitting: Internal Medicine

## 2017-09-12 ENCOUNTER — Ambulatory Visit (INDEPENDENT_AMBULATORY_CARE_PROVIDER_SITE_OTHER)
Admission: RE | Admit: 2017-09-12 | Discharge: 2017-09-12 | Disposition: A | Discharge status: Home / Self Care | Payer: Medicare Other | Source: Ambulatory Visit | Attending: Internal Medicine | Admitting: Internal Medicine

## 2017-09-12 ENCOUNTER — Encounter: Payer: Self-pay | Admitting: Internal Medicine

## 2017-09-12 DIAGNOSIS — R531 Weakness: Secondary | ICD-10-CM

## 2017-09-12 DIAGNOSIS — E538 Deficiency of other specified B group vitamins: Secondary | ICD-10-CM

## 2017-09-12 DIAGNOSIS — K5900 Constipation, unspecified: Secondary | ICD-10-CM

## 2017-09-12 DIAGNOSIS — I10 Essential (primary) hypertension: Secondary | ICD-10-CM | POA: Diagnosis not present

## 2017-09-12 DIAGNOSIS — R1030 Lower abdominal pain, unspecified: Secondary | ICD-10-CM

## 2017-09-12 NOTE — Assessment & Plan Note (Signed)
The pt would like to be placed in [facility]

## 2017-09-12 NOTE — Assessment & Plan Note (Signed)
On B12 

## 2017-09-12 NOTE — Assessment & Plan Note (Signed)
H/o impaction X ray

## 2017-09-12 NOTE — Progress Notes (Signed)
Subjective:  Patient ID: [name], female    DOB: 1925-11-22  Age: 84 y.o. MRN: [id number]  CC: No chief complaint on file.   HPI [name] presents for weakness, stool incontinence, HTN f/u. She would like to be placed to [facility] for PT/care Took a laxative - Miralax - lost control of the bowels  In a w/c now  Last CXR nl on11/30/19  Outpatient Medications Prior to Visit  Medication Sig Dispense Refill  . acetaminophen (TYLENOL) 500 MG tablet Take 500 mg by mouth 2 (two) times daily.     Marland Kitchen amLODipine (NORVASC) 2.5 MG tablet Take 1 tablet (2.5 mg total) by mouth daily. 90 tablet 3  . aspirin 81 MG EC tablet Take 81 mg by mouth daily.      . B-D INS SYRINGE 0.5CC/30GX1/2" 30G X 1/2" 0.5 ML MISC USE TO ADMINISTER B12 INJECTIONS 10 each 1  . Bioflavonoid Products (GRAPE SEED PO) Take 1 tablet by mouth daily.    . Cholecalciferol (VITAMIN D3) 2000 units capsule Take 1 capsule (2,000 Units total) by mouth daily. 100 capsule 3  . clotrimazole-betamethasone (LOTRISONE) cream Apply to affected lower vaginal area, 2 times daily, until healed 30 g 0  . conjugated estrogens (PREMARIN) vaginal cream 1/2 gram vaginally every night x 1 week, then twice weekly at bedtime 90 g 3  . cromolyn (OPTICROM) 4 % ophthalmic solution 1 DROP IN BOTH EYES FOUR TIMES A DAY  12  . cyanocobalamin (,VITAMIN B-12,) 1000 MCG/ML injection ADMINISTER 1 CC SUBQ EVERY 4 WEEKS AS DIRECTED 10 mL 0  . donepezil (ARICEPT) 5 MG tablet Take 1 tablet (5 mg total) by mouth at bedtime. 30 tablet 5  . Erythromycin 2 % ointment Apply to affected area 2 times daily 25 g 0  . fluticasone (FLONASE) 50 MCG/ACT nasal spray Place 2 sprays into both nostrils daily. 48 g 0  . folic acid (FOLVITE) 1 MG tablet Take 1 mg by mouth daily. For restless legs    . gabapentin (NEURONTIN) 300 MG capsule TAKE 1 CAPSULE (300 MG TOTAL) BY MOUTH DAILY. 90 capsule 1  . GINSENG PO Take 1 tablet by mouth daily.    . hydrochlorothiazide  (MICROZIDE) 12.5 MG capsule Take 1 capsule (12.5 mg total) by mouth daily. For blood pressure 90 capsule 1  . KLOR-CON M10 10 MEQ tablet TAKE 1 TABLET (10 MEQ TOTAL) BY MOUTH DAILY. 90 tablet 1  . latanoprost (XALATAN) 0.005 % ophthalmic solution Place 1 drop into both eyes at bedtime. 6 mL 1  . loratadine (CLARITIN) 10 MG tablet TAKE 1 TABLET (10 MG TOTAL) BY MOUTH DAILY. 90 tablet 0  . MAGNESIUM CHLORIDE PO Take 1 tablet by mouth daily.    . Multiple Vitamins-Minerals (CENTRUM SILVER 50+WOMEN) TABS Take by mouth.    . rosuvastatin (CRESTOR) 5 MG tablet Take 1 tablet (5 mg total) by mouth daily. 90 tablet 3  . sucralfate (CARAFATE) 1 g tablet Take 1 tablet (1 g total) by mouth 4 (four) times daily -  with meals and at bedtime. 120 tablet 5  . vitamin B-12 (CYANOCOBALAMIN) 1000 MCG tablet Take 1 tablet (1,000 mcg total) 3 (three) times a week by mouth.    . Dexlansoprazole (DEXILANT) 30 MG capsule Take 1 capsule (30 mg total) by mouth daily. 90 capsule 3   No facility-administered medications prior to visit.     ROS Review of Systems  Constitutional: Positive for fatigue. Negative for activity change, appetite change,  chills and unexpected weight change.  HENT: Negative for congestion, mouth sores and sinus pressure.   Eyes: Negative for visual disturbance.  Respiratory: Negative for cough and chest tightness.   Gastrointestinal: Positive for constipation and diarrhea. Negative for abdominal pain and nausea.  Genitourinary: Negative for difficulty urinating, frequency and vaginal pain.  Musculoskeletal: Positive for arthralgias, back pain and gait problem.  Skin: Negative for pallor and rash.  Neurological: Positive for weakness. Negative for dizziness, tremors, numbness and headaches.  Psychiatric/Behavioral: Negative for confusion and sleep disturbance. The patient is nervous/anxious.     Objective:  BP 124/78 (BP Location: Left Arm, Patient Position: Sitting, Cuff Size: Normal)    Pulse 89   Temp 97.9 F (36.6 C) (Oral)   Ht 5\' 3"  (1.6 m)   Wt 132 lb (59.9 kg)   SpO2 98%   BMI 23.38 kg/m   BP Readings from Last 3 Encounters:  09/12/17 124/78  08/28/17 (!) 143/81  08/28/17 136/68    Wt Readings from Last 3 Encounters:  09/12/17 132 lb (59.9 kg)  08/28/17 133 lb 3.2 oz (60.4 kg)  08/07/17 130 lb (59 kg)    Physical Exam  Constitutional: She appears well-developed. No distress.  HENT:  Head: Normocephalic.  Right Ear: External ear normal.  Left Ear: External ear normal.  Nose: Nose normal.  Mouth/Throat: Oropharynx is clear and moist.  Eyes: Conjunctivae are normal. Pupils are equal, round, and reactive to light. Right eye exhibits no discharge. Left eye exhibits no discharge.  Neck: Normal range of motion. Neck supple. No JVD present. No tracheal deviation present. No thyromegaly present.  Cardiovascular: Normal rate, regular rhythm and normal heart sounds.  Pulmonary/Chest: No stridor. No respiratory distress. She has no wheezes.  Abdominal: Soft. Bowel sounds are normal. She exhibits no distension and no mass. There is no tenderness. There is no rebound and no guarding.  Musculoskeletal: She exhibits tenderness. She exhibits no edema.  Lymphadenopathy:    She has no cervical adenopathy.  Neurological: She displays normal reflexes. No cranial nerve deficit. She exhibits normal muscle tone. Coordination normal.  Skin: No rash noted. No erythema.  Psychiatric: She has a normal mood and affect. Her behavior is normal. Judgment and thought content normal.    Lab Results  Component Value Date   WBC 5.0 08/28/2017   HGB 14.1 08/28/2017   HCT 41.4 08/28/2017   PLT 171 08/28/2017   GLUCOSE 181 (H) 06/12/2017   CHOL 191 10/01/2014   TRIG 72.0 10/01/2014   HDL 57.80 10/01/2014   LDLCALC 119 (H) 10/01/2014   ALT 15 11/23/2016   AST 21 11/23/2016   NA 132 (L) 06/12/2017   K 4.0 06/12/2017   CL 97 06/12/2017   CREATININE 0.81 06/12/2017   BUN 21  06/12/2017   CO2 28 06/12/2017   TSH 3.38 12/03/2015   INR 1.05 05/10/2016   HGBA1C  10/07/2008    5.9 (NOTE)   The ADA recommends the following therapeutic goal for glycemic   control related to Hgb A1C measurement:   Goal of Therapy:   < 7.0% Hgb A1C   Reference: American Diabetes Association: Clinical Practice   Recommendations 2008, Diabetes Care,  2008, 31:(Suppl 1).   MICROALBUR 0.4 07/14/2009    Dg Chest 2 View  Result Date: 06/22/2017 CLINICAL DATA:  TB screening EXAM: CHEST  2 VIEW COMPARISON:  10/07/2008, 05/30/2016, 05/01/2017 FINDINGS: There is right apical scarring unchanged compared with 05/30/2016. There is no focal parenchymal opacity. There is no  pleural effusion or pneumothorax. The heart and mediastinal contours are unremarkable. The osseous structures are unremarkable. IMPRESSION: No active cardiopulmonary disease. Electronically Signed   By: Kathreen Devoid   On: 06/22/2017 15:34    Assessment & Plan:   There are no diagnoses linked to this encounter. I am having Rito Ehrlich. Mikkelson maintain her aspirin, acetaminophen, folic acid, conjugated estrogens, cromolyn, cyanocobalamin, GINSENG PO, Bioflavonoid Products (GRAPE SEED PO), MAGNESIUM CHLORIDE PO, fluticasone, Vitamin D3, CENTRUM SILVER 50+WOMEN, amLODipine, KLOR-CON M10, Erythromycin, vitamin B-12, B-D INS SYRINGE 0.5CC/30GX1/2", latanoprost, clotrimazole-betamethasone, hydrochlorothiazide, donepezil, Dexlansoprazole, sucralfate, rosuvastatin, loratadine, and gabapentin.  No orders of the defined types were placed in this encounter.    Follow-up: No Follow-up on file.  Walker Kehr, MD

## 2017-09-12 NOTE — Assessment & Plan Note (Signed)
Amlodipine HCT

## 2017-09-12 NOTE — Assessment & Plan Note (Signed)
abd X ray

## 2017-09-13 ENCOUNTER — Telehealth: Payer: Self-pay | Admitting: Internal Medicine

## 2017-09-13 NOTE — Telephone Encounter (Signed)
Copied from [place] [id number]. Topic: Quick Communication - See Telephone Encounter >> Sep 13, 2017  2:20 PM Robina Ade, Helene Kelp D wrote: CRM for notification. See Telephone encounter for: 09/13/17. Patient called and said that she needs a fax note to her nursing home letting them know that she does not have TB. They need this for her to able to go back. Patient would like to talk to Dr. Alain Marion CMA about this. Please call patient back, thanks.

## 2017-09-13 NOTE — Telephone Encounter (Signed)
Pt notified that xray results showing she does not have TB are being faxed with FL2 form

## 2017-09-16 ENCOUNTER — Emergency Department (HOSPITAL_COMMUNITY)
Admission: EM | Admit: 2017-09-16 | Discharge: 2017-09-16 | Disposition: A | Discharge status: Home / Self Care | Payer: Medicare Other | Attending: Emergency Medicine | Admitting: Emergency Medicine

## 2017-09-16 ENCOUNTER — Encounter (HOSPITAL_COMMUNITY): Payer: Self-pay | Admitting: Emergency Medicine

## 2017-09-16 ENCOUNTER — Emergency Department (HOSPITAL_COMMUNITY): Payer: Medicare Other

## 2017-09-16 DIAGNOSIS — K5900 Constipation, unspecified: Secondary | ICD-10-CM | POA: Diagnosis present

## 2017-09-16 DIAGNOSIS — N390 Urinary tract infection, site not specified: Secondary | ICD-10-CM | POA: Diagnosis not present

## 2017-09-16 DIAGNOSIS — I10 Essential (primary) hypertension: Secondary | ICD-10-CM | POA: Diagnosis not present

## 2017-09-16 DIAGNOSIS — M6281 Muscle weakness (generalized): Secondary | ICD-10-CM | POA: Insufficient documentation

## 2017-09-16 DIAGNOSIS — Z79899 Other long term (current) drug therapy: Secondary | ICD-10-CM | POA: Diagnosis not present

## 2017-09-16 DIAGNOSIS — R918 Other nonspecific abnormal finding of lung field: Secondary | ICD-10-CM | POA: Diagnosis not present

## 2017-09-16 DIAGNOSIS — R531 Weakness: Secondary | ICD-10-CM

## 2017-09-16 LAB — CBC
HCT: 40.6 % (ref 36.0–46.0)
HEMOGLOBIN: 13.9 g/dL (ref 12.0–15.0)
MCH: 31.7 pg (ref 26.0–34.0)
MCHC: 34.2 g/dL (ref 30.0–36.0)
MCV: 92.7 fL (ref 78.0–100.0)
PLATELETS: 173 10*3/uL (ref 150–400)
RBC: 4.38 MIL/uL (ref 3.87–5.11)
RDW: 13.9 % (ref 11.5–15.5)
WBC: 5.4 10*3/uL (ref 4.0–10.5)

## 2017-09-16 LAB — URINALYSIS, ROUTINE W REFLEX MICROSCOPIC
Bilirubin Urine: NEGATIVE
Glucose, UA: 50 mg/dL — AB
Ketones, ur: NEGATIVE mg/dL
Nitrite: POSITIVE — AB
Protein, ur: 30 mg/dL — AB
SPECIFIC GRAVITY, URINE: 1.021 (ref 1.005–1.030)
pH: 5 (ref 5.0–8.0)

## 2017-09-16 LAB — COMPREHENSIVE METABOLIC PANEL
ALT: 20 U/L (ref 14–54)
ANION GAP: 13 (ref 5–15)
AST: 27 U/L (ref 15–41)
Albumin: 3.8 g/dL (ref 3.5–5.0)
Alkaline Phosphatase: 41 U/L (ref 38–126)
BUN: 14 mg/dL (ref 6–20)
CHLORIDE: 97 mmol/L — AB (ref 101–111)
CO2: 24 mmol/L (ref 22–32)
CREATININE: 0.66 mg/dL (ref 0.44–1.00)
Calcium: 9.6 mg/dL (ref 8.9–10.3)
Glucose, Bld: 111 mg/dL — ABNORMAL HIGH (ref 65–99)
Potassium: 3.4 mmol/L — ABNORMAL LOW (ref 3.5–5.1)
Sodium: 134 mmol/L — ABNORMAL LOW (ref 135–145)
Total Bilirubin: 0.7 mg/dL (ref 0.3–1.2)
Total Protein: 7.6 g/dL (ref 6.5–8.1)

## 2017-09-16 LAB — LIPASE, BLOOD: LIPASE: 35 U/L (ref 11–51)

## 2017-09-16 MED ORDER — SODIUM CHLORIDE 0.9 % IV SOLN
1.0000 g | Freq: Once | INTRAVENOUS | Status: AC
  Administered 2017-09-16: 1 g via INTRAVENOUS
  Filled 2017-09-16: qty 10

## 2017-09-16 MED ORDER — CEPHALEXIN 500 MG PO CAPS: 500.0000 mg | ORAL_CAPSULE | Freq: Four times a day (QID) | ORAL | 0 refills | Status: DC

## 2017-09-16 MED ORDER — SODIUM CHLORIDE 0.9 % IV BOLUS (SEPSIS)
1000.0000 mL | Freq: Once | INTRAVENOUS | Status: AC
  Administered 2017-09-16: 1000 mL via INTRAVENOUS

## 2017-09-16 NOTE — Discharge Instructions (Addendum)
Tests showed no life-threatening condition.  No evidence of constipation via your x-ray.  You do have a questionable area in your right lung that needs further evaluation.  The radiologist has recommended a CT scan of your chest.  Please consult your primary care doctor who can order this test as an outpatient.  You also have a urinary tract infection.  Prescription for antibiotic.  Increase fluids.

## 2017-09-16 NOTE — ED Provider Notes (Addendum)
[facility] DEPT Provider Note   CSN: [id number] Arrival date & time: 09/16/17  1038     History   Chief Complaint Chief Complaint  Patient presents with  . Constipation    HPI [name] is a 84 y.o. female.  Level 5 caveat for mild dementia.  Patient reports generalized weakness.  She is also concerned about issues of constipation.  She is scheduled to be admitted to a nursing home on Tuesday of this week.  She has been eating and drinking.  She feels a fullness in her suprapubic area.  She is ambulatory with a walker.  No fever, sweats, chills, dysuria, chest pain, dyspnea, cough.  Severity of symptoms is mild.      Past Medical History:  Diagnosis Date  . Allergy    rhinitis  . Chronic ethmoidal sinusitis   . Diverticulosis   . Diverticulosis of colon   . GERD (gastroesophageal reflux disease)   . Glaucoma   . Hx of colonic polyps   . Hyperlipidemia   . Hypertension   . Hypogammaglobulinemia ([place])    Monthly IVIG  . Leukemia ([place])    Chronic lymphocytic leukemia  . Osteoarthritis   . Peripheral neuropathy   . Pernicious anemia   . UTI (lower urinary tract infection) 2010  . Vitamin B12 deficiency     Patient Active Problem List   Diagnosis Date Noted  . Constipation 09/12/2017  . Memory loss 07/25/2017  . Neck pain on left side 07/25/2017  . Urinary tract infection, recurrent 06/12/2017  . Thrush 02/10/2017  . Frequent urination 02/10/2017  . Osteoporosis 11/23/2016  . Abdominal pain 08/16/2016  . Urinary frequency 08/16/2016  . Peripheral edema 08/16/2016  . General weakness 08/16/2016  . Gait disorder 08/16/2016  . Dysuria 08/03/2016  . Syncope and collapse 05/10/2016  . Closed displaced simple supracondylar fracture of right humerus without intercondylar fracture 05/10/2016  . Urinary tract infection without hematuria 05/10/2016  . Syncope 05/10/2016  . Humeral head fracture, right, closed, initial encounter     . Burning tongue 03/25/2016  . Post concussion syndrome 03/03/2016  . Fall 03/03/2016  . Cystocele 03/05/2015  . Dehydration 02/08/2015  . Hyponatremia 02/08/2015  . Well adult exam 10/01/2014  . Grief at loss of child 12/11/2013  . Irregular heart beat 09/29/2013  . Humeral head fracture 08/26/2013  . Actinic keratoses 08/26/2013  . Carotid bruit 05/02/2012  . Anxiety 03/17/2011  . ANEMIA, PERNICIOUS, HX OF 10/10/2007  . Peripheral neuropathy ([place]) 09/04/2007  . Chronic fatigue 08/06/2007  . Chronic lymphocytic leukemia ([place]) 06/17/2007  . Allergic rhinitis 06/17/2007  . OSTEOARTHRITIS 06/17/2007  . B12 deficiency 06/07/2007  . GERD (gastroesophageal reflux disease) 04/30/2007  . Dyslipidemia 12/19/2006  . Essential hypertension 12/19/2006  . DIVERTICULOSIS, COLON 05/19/2003  . COLONIC POLYPS, HX OF 01/10/2000    Past Surgical History:  Procedure Laterality Date  . ABDOMINAL HYSTERECTOMY  1987  . bilateral cataracts  2007  . bladder tack  1987  . L4-L5 Laminetomy  1999  . TUBAL LIGATION    . tubaligation  1961  . ureter reconstruction  1992    OB History    Gravida Para Term Preterm AB Living   4 4       4    SAB TAB Ectopic Multiple Live Births                   Home Medications    Prior to Admission medications  Medication Sig Start Date End Date Taking? Authorizing Provider  acetaminophen (TYLENOL) 500 MG tablet Take 500 mg by mouth 2 (two) times daily.     [provider]  amLODipine (NORVASC) 2.5 MG tablet Take 1 tablet (2.5 mg total) by mouth daily. 02/27/17   Plotnikov, Evie Lacks, MD  aspirin 81 MG EC tablet Take 81 mg by mouth daily.      [provider]  B-D INS SYRINGE 0.5CC/30GX1/2" 30G X 1/2" 0.5 ML MISC USE TO ADMINISTER B12 INJECTIONS 06/13/17   Plotnikov, Evie Lacks, MD  Bioflavonoid Products (GRAPE SEED PO) Take 1 tablet by mouth daily.    [provider]  cephALEXin (KEFLEX) 500 MG capsule Take 1 capsule (500 mg  total) by mouth 4 (four) times daily. 09/16/17   Nat Christen, MD  Cholecalciferol (VITAMIN D3) 2000 units capsule Take 1 capsule (2,000 Units total) by mouth daily. 11/23/16   Plotnikov, Evie Lacks, MD  clotrimazole-betamethasone (LOTRISONE) cream Apply to affected lower vaginal area, 2 times daily, until healed 06/19/17   Daleen Bo, MD  conjugated estrogens (PREMARIN) vaginal cream 1/2 gram vaginally every night x 1 week, then twice weekly at bedtime 12/29/15   Salvadore Dom, MD  cromolyn (OPTICROM) 4 % ophthalmic solution 1 DROP IN BOTH EYES FOUR TIMES A DAY 03/21/16   [provider]  cyanocobalamin (,VITAMIN B-12,) 1000 MCG/ML injection ADMINISTER 1 CC SUBQ EVERY 4 WEEKS AS DIRECTED 08/07/16   Plotnikov, Evie Lacks, MD  Dexlansoprazole (DEXILANT) 30 MG capsule Take 1 capsule (30 mg total) by mouth daily. 07/25/17 08/24/17  Plotnikov, Evie Lacks, MD  donepezil (ARICEPT) 5 MG tablet Take 1 tablet (5 mg total) by mouth at bedtime. 07/25/17   Plotnikov, Evie Lacks, MD  Erythromycin 2 % ointment Apply to affected area 2 times daily 05/12/17   Dorie Rank, MD  fluticasone [facility] 50 MCG/ACT nasal spray Place 2 sprays into both nostrils daily. 11/21/16   Plotnikov, Evie Lacks, MD  folic acid (FOLVITE) 1 MG tablet Take 1 mg by mouth daily. For restless legs    [provider]  gabapentin (NEURONTIN) 300 MG capsule TAKE 1 CAPSULE (300 MG TOTAL) BY MOUTH DAILY. 09/06/17   Plotnikov, Evie Lacks, MD  GINSENG PO Take 1 tablet by mouth daily.    [provider]  hydrochlorothiazide (MICROZIDE) 12.5 MG capsule Take 1 capsule (12.5 mg total) by mouth daily. For blood pressure 07/12/17   Plotnikov, Evie Lacks, MD  KLOR-CON M10 10 MEQ tablet TAKE 1 TABLET (10 MEQ TOTAL) BY MOUTH DAILY. 05/07/17   Plotnikov, Evie Lacks, MD  latanoprost (XALATAN) 0.005 % ophthalmic solution Place 1 drop into both eyes at bedtime. 06/18/17   Plotnikov, Evie Lacks, MD  loratadine (CLARITIN) 10 MG tablet TAKE 1 TABLET  (10 MG TOTAL) BY MOUTH DAILY. 08/23/17   Plotnikov, Evie Lacks, MD  MAGNESIUM CHLORIDE PO Take 1 tablet by mouth daily.    [provider]  Multiple Vitamins-Minerals (CENTRUM SILVER 50+WOMEN) TABS Take by mouth.    [provider]  rosuvastatin (CRESTOR) 5 MG tablet Take 1 tablet (5 mg total) by mouth daily. 08/21/17   Plotnikov, Evie Lacks, MD  sucralfate (CARAFATE) 1 g tablet Take 1 tablet (1 g total) by mouth 4 (four) times daily -  with meals and at bedtime. 08/07/17   Plotnikov, Evie Lacks, MD  vitamin B-12 (CYANOCOBALAMIN) 1000 MCG tablet Take 1 tablet (1,000 mcg total) 3 (three) times a week by mouth. 05/30/17   Betsy Coder  B, MD    Family History Family History  Problem Relation Age of Onset  . Jaundice Father   . Stroke Son 49       in NH  . Cancer Mother        uterine  . Diabetes Mother   . Other Sister        brain tumor  . Cancer Sister   . Hypertension Other   . Cancer Other        breast  . Thyroid disease Sister   . Cancer Sister        thyroid cancer    Social History Social History   Tobacco Use  . Smoking status: Never Smoker  . Smokeless tobacco: Never Used  Substance Use Topics  . Alcohol use: No    Alcohol/week: 0.0 oz  . Drug use: No     Allergies   Pneumococcal vaccines; Clarithromycin; Hctz [hydrochlorothiazide]; Oxycodone-aspirin; and Propoxyphene n-acetaminophen   Review of Systems Review of Systems  Unable to perform ROS: Dementia     Physical Exam Updated Vital Signs BP (!) 148/81 (BP Location: Left Arm)   Pulse 83   Temp 98 F (36.7 C) (Oral)   Resp (!) 24   SpO2 95%   Physical Exam  Constitutional: She is oriented to person, place, and time.  No acute distress  HENT:  Head: Normocephalic and atraumatic.  Eyes: Conjunctivae are normal.  Neck: Neck supple.  Cardiovascular: Normal rate and regular rhythm.  Pulmonary/Chest: Effort normal and breath sounds normal.  Abdominal: Soft. Bowel sounds are normal.   Musculoskeletal: Normal range of motion.  Neurological: She is alert and oriented to person, place, and time.  Skin: Skin is warm and dry.  Psychiatric: She has a normal mood and affect. Her behavior is normal.  Nursing note and vitals reviewed.    ED Treatments / Results  Labs (all labs ordered are listed, but only abnormal results are displayed) Labs Reviewed  COMPREHENSIVE METABOLIC PANEL - Abnormal; Notable for the following components:      Result Value   Sodium 134 (*)    Potassium 3.4 (*)    Chloride 97 (*)    Glucose, Bld 111 (*)    All other components within normal limits  URINALYSIS, ROUTINE W REFLEX MICROSCOPIC - Abnormal; Notable for the following components:   Color, Urine AMBER (*)    APPearance CLOUDY (*)    Glucose, UA 50 (*)    Hgb urine dipstick LARGE (*)    Protein, ur 30 (*)    Nitrite POSITIVE (*)    Leukocytes, UA LARGE (*)    Bacteria, UA FEW (*)    Squamous Epithelial / LPF 0-5 (*)    Crystals PRESENT (*)    All other components within normal limits  URINE CULTURE  LIPASE, BLOOD  CBC    EKG  EKG Interpretation None       Radiology Dg Abdomen Acute W/chest  Result Date: 09/16/2017 CLINICAL DATA:  Constipation and impaction EXAM: DG ABDOMEN ACUTE W/ 1V CHEST COMPARISON:  September 12, 2017 FINDINGS: There is a prominent skin fold over the upper lateral left chest. No convincing evidence of pneumothorax. A spiculated nodule in the right upper lung remains, mildly more prominent on today's study. The heart, hila, and mediastinum are normal. No other changes in the chest. Healed right proximal humerus fracture. No free air or free fluid. No bowel obstruction. No obvious constipation. No other acute abnormalities. Scoliotic curvature of the spine.  IMPRESSION: 1. Persistent spiculated nodule in the right upper lobe. This nodule is more prominent today. Whether this is a true difference or due to difference in technique is unclear. CT imaging has been  previously recommended. If it has not been performed, a CT scan as an outpatient could further evaluate. 2. No evidence of constipation or bowel obstruction. Electronically Signed   By: Dorise Bullion III M.D   On: 09/16/2017 12:25    Procedures Procedures (including critical care time)  Medications Ordered in ED Medications  sodium chloride 0.9 % bolus 1,000 mL (1,000 mLs Intravenous New Bag/Given 09/16/17 1221)  cefTRIAXone (ROCEPHIN) 1 g in sodium chloride 0.9 % 100 mL IVPB (0 g Intravenous Stopped 09/16/17 1536)     Initial Impression / Assessment and Plan / ED Course  I have reviewed the triage vital signs and the nursing notes.  Pertinent labs & imaging results that were available during my care of the patient were reviewed by me and considered in my medical decision making (see chart for details).     Patient appears stable and in no acute distress.  Basic labs were acceptable.  Chest x-ray reveals a persistent spiculated nodule in the right upper lobe.  This was discussed with the patient and her son.  She will need a CT scan of the chest as an outpatient.  No evidence of constipation on abdominal films.  Urinalysis shows evidence of infection.  IV Rocephin.  Urine culture.  Discharge medication cephalexin 500 mg.  Final Clinical Impressions(s) / ED Diagnoses   Final diagnoses:  Weakness  Urinary tract infection without hematuria, site unspecified  Mass of right lung    ED Discharge Orders        Ordered    cephALEXin (KEFLEX) 500 MG capsule  4 times daily     09/16/17 1546       Nat Christen, MD 09/16/17 1449    Nat Christen, MD 09/16/17 1550

## 2017-09-16 NOTE — ED Triage Notes (Signed)
Patient here from [place] brought in by son with complaints of constipation and impaction. States that he had an abdominal x-ray done on 2/16 that was negative. States "x-ray is wrong". States that something is pressing down on her bladder.

## 2017-09-17 ENCOUNTER — Telehealth: Payer: Self-pay | Admitting: Internal Medicine

## 2017-09-17 NOTE — Telephone Encounter (Signed)
Ok Pls do Thx 

## 2017-09-17 NOTE — Telephone Encounter (Signed)
Please advise 

## 2017-09-17 NOTE — Telephone Encounter (Signed)
Copied from [place]. Topic: General - Other >> Sep 17, 2017  1:35 PM Yvette Rack wrote: Reason for CRM: patient calling stating that she was in the hospital yesterday and was DX with a bladder infection she states that she would like for Dr Alain Marion to put in a referral for her to continue with [facility] home health for physical therapy because of the bladder infection please call pt at home if you have any questions

## 2017-09-18 ENCOUNTER — Encounter: Payer: Self-pay | Admitting: Gastroenterology

## 2017-09-18 ENCOUNTER — Ambulatory Visit (INDEPENDENT_AMBULATORY_CARE_PROVIDER_SITE_OTHER): Payer: Medicare Other | Admitting: Gastroenterology

## 2017-09-18 VITALS — BP 100/62 | HR 68 | Ht 63.0 in | Wt 130.0 lb

## 2017-09-18 DIAGNOSIS — K219 Gastro-esophageal reflux disease without esophagitis: Secondary | ICD-10-CM

## 2017-09-18 DIAGNOSIS — K1379 Other lesions of oral mucosa: Secondary | ICD-10-CM | POA: Diagnosis not present

## 2017-09-18 DIAGNOSIS — R070 Pain in throat: Secondary | ICD-10-CM | POA: Diagnosis not present

## 2017-09-18 DIAGNOSIS — K146 Glossodynia: Secondary | ICD-10-CM | POA: Diagnosis not present

## 2017-09-18 NOTE — Progress Notes (Signed)
History of Present Illness: This is an 84 year old female referred by Plotnikov, Evie Lacks, MD for the evaluation of burning pain in her mouth tongue and throat for 1 year.  Symptoms have not been helped at all by PPIs and Carafate. She did have occasional substernal burning in the past with the symptoms completely resolved on PPIs and Carafate.  She describes her burning pain in her mouth and tongue and throat is acid reflux related.  She has CLL and is undergoing treatment for hypogammaglobulinemia with monthly IVIG.  She was evaluated in the ED for constipation and weakness.  Urinary tract infection diagnosed and antibiotics were started.  Her constipation resolved however her generalized weakness persists.  She had an episode of incontinence when taking MiraLAX for her constipation.  CXR performed in the ED revealed a spiculated nodule in the right upper lobe.  Nursing home placement at [facility] is planned for PT. Denies weight loss, abdominal pain, diarrhea, change in stool caliber, melena, hematochezia, nausea, vomiting, dysphagia, chest pain.   Allergies  Allergen Reactions  . Pneumococcal Vaccines Swelling and Rash    Pneumococcal Vaccine-23 valent  . Clarithromycin Other (See Comments)    REACTION: sore mouth  . Hctz [Hydrochlorothiazide]     Low Na  . Oxycodone-Aspirin Other (See Comments)    REACTION: horrible nightmares  . Propoxyphene N-Acetaminophen Other (See Comments)    REACTION: couldn't wake her up   Outpatient Medications Prior to Visit  Medication Sig Dispense Refill  . acetaminophen (TYLENOL) 500 MG tablet Take 500 mg by mouth 2 (two) times daily.     Marland Kitchen amLODipine (NORVASC) 2.5 MG tablet Take 1 tablet (2.5 mg total) by mouth daily. 90 tablet 3  . aspirin 81 MG EC tablet Take 81 mg by mouth daily.      . B-D INS SYRINGE 0.5CC/30GX1/2" 30G X 1/2" 0.5 ML MISC USE TO ADMINISTER B12 INJECTIONS 10 each 1  . Bioflavonoid Products (GRAPE SEED PO) Take 1 tablet by mouth daily.     . cephALEXin (KEFLEX) 500 MG capsule Take 1 capsule (500 mg total) by mouth 4 (four) times daily. 20 capsule 0  . Cholecalciferol (VITAMIN D3) 2000 units capsule Take 1 capsule (2,000 Units total) by mouth daily. 100 capsule 3  . clotrimazole-betamethasone (LOTRISONE) cream Apply to affected lower vaginal area, 2 times daily, until healed 30 g 0  . conjugated estrogens (PREMARIN) vaginal cream 1/2 gram vaginally every night x 1 week, then twice weekly at bedtime 90 g 3  . cromolyn (OPTICROM) 4 % ophthalmic solution 1 DROP IN BOTH EYES FOUR TIMES A DAY  12  . cyanocobalamin (,VITAMIN B-12,) 1000 MCG/ML injection ADMINISTER 1 CC SUBQ EVERY 4 WEEKS AS DIRECTED 10 mL 0  . donepezil (ARICEPT) 5 MG tablet Take 1 tablet (5 mg total) by mouth at bedtime. 30 tablet 5  . Erythromycin 2 % ointment Apply to affected area 2 times daily 25 g 0  . fluticasone (FLONASE) 50 MCG/ACT nasal spray Place 2 sprays into both nostrils daily. 48 g 0  . folic acid (FOLVITE) 1 MG tablet Take 1 mg by mouth daily. For restless legs    . gabapentin (NEURONTIN) 300 MG capsule TAKE 1 CAPSULE (300 MG TOTAL) BY MOUTH DAILY. 90 capsule 1  . GINSENG PO Take 1 tablet by mouth daily.    . hydrochlorothiazide (MICROZIDE) 12.5 MG capsule Take 1 capsule (12.5 mg total) by mouth daily. For blood pressure 90 capsule 1  .  KLOR-CON M10 10 MEQ tablet TAKE 1 TABLET (10 MEQ TOTAL) BY MOUTH DAILY. 90 tablet 1  . latanoprost (XALATAN) 0.005 % ophthalmic solution Place 1 drop into both eyes at bedtime. 6 mL 1  . loratadine (CLARITIN) 10 MG tablet TAKE 1 TABLET (10 MG TOTAL) BY MOUTH DAILY. 90 tablet 0  . MAGNESIUM CHLORIDE PO Take 1 tablet by mouth daily.    . Multiple Vitamins-Minerals (CENTRUM SILVER 50+WOMEN) TABS Take by mouth.    . rosuvastatin (CRESTOR) 5 MG tablet Take 1 tablet (5 mg total) by mouth daily. 90 tablet 3  . sucralfate (CARAFATE) 1 g tablet Take 1 tablet (1 g total) by mouth 4 (four) times daily -  with meals and at bedtime.  120 tablet 5  . vitamin B-12 (CYANOCOBALAMIN) 1000 MCG tablet Take 1 tablet (1,000 mcg total) 3 (three) times a week by mouth.    . Dexlansoprazole (DEXILANT) 30 MG capsule Take 1 capsule (30 mg total) by mouth daily. 90 capsule 3   No facility-administered medications prior to visit.    Past Medical History:  Diagnosis Date  . Allergy    rhinitis  . Chronic ethmoidal sinusitis   . Diverticulosis   . Diverticulosis of colon   . GERD (gastroesophageal reflux disease)   . Glaucoma   . Hx of colonic polyps   . Hyperlipidemia   . Hypertension   . Hypogammaglobulinemia ([place])    Monthly IVIG  . Leukemia ([place])    Chronic lymphocytic leukemia  . Osteoarthritis   . Peripheral neuropathy   . Pernicious anemia   . UTI (lower urinary tract infection) 2010  . Vitamin B12 deficiency    Past Surgical History:  Procedure Laterality Date  . ABDOMINAL HYSTERECTOMY  1987  . bilateral cataracts  2007  . bladder tack  1987  . L4-L5 Laminetomy  1999  . TUBAL LIGATION    . tubaligation  1961  . ureter reconstruction  1992   Social History   Socioeconomic History  . Marital status: Married    Spouse name: None  . Number of children: None  . Years of education: None  . Highest education level: None  Social Needs  . Financial resource strain: None  . Food insecurity - worry: None  . Food insecurity - inability: None  . Transportation needs - medical: None  . Transportation needs - non-medical: None  Occupational History  . None  Tobacco Use  . Smoking status: Never Smoker  . Smokeless tobacco: Never Used  Substance and Sexual Activity  . Alcohol use: No    Alcohol/week: 0.0 oz  . Drug use: No  . Sexual activity: No  Other Topics Concern  . None  Social History Narrative  . None   Family History  Problem Relation Age of Onset  . Jaundice Father   . Stroke Son 89       in NH  . Cancer Mother        uterine  . Diabetes Mother   . Other Sister        brain tumor  .  Cancer Sister   . Hypertension Other   . Cancer Other        breast  . Thyroid disease Sister   . Cancer Sister        thyroid cancer      Review of Systems: Pertinent positive and negative review of systems were noted in the above HPI section. All other review of systems were otherwise negative.  Physical Exam: General: Well developed, well nourished, frail, elderly female, using a wheelchair and can walk with assistance, no acute distress Head: Normocephalic and atraumatic Eyes:  sclerae anicteric, EOMI Ears: Normal auditory acuity Mouth: No deformity or lesions Neck: Supple, no masses or thyromegaly Lungs: Clear throughout to auscultation Heart: Regular rate and rhythm; no murmurs, rubs or bruits Abdomen: Soft, non tender and non distended. No masses, hepatosplenomegaly or hernias noted. Normal Bowel sounds Rectal: Musculoskeletal: Symmetrical with no gross deformities  Skin: No lesions on visible extremities Pulses:  Normal pulses noted Extremities: No clubbing, cyanosis, edema or deformities noted Neurological: Alert oriented x 4, grossly nonfocal Cervical Nodes:  No significant cervical adenopathy Inguinal Nodes: No significant inguinal adenopathy Psychological:  Alert and cooperative. Normal mood and affect  Assessment and Recommendations:  1.  Burning mouth, tongue and throat pain does not appear to be related to acid reflux which is well controlled on current medications.  ENT referral for further evaluation.  2.  GERD.  This does not appear to be the cause of her burning mouth, tongue and throat pain.  Continue Dexilant 30 mg daily and Carafate 1 g 4 times daily as needed.  3. Abnormal chest x-ray.  Follow-up with Dr. Alain Marion and oncology.  4. UTI continue treatment and follow-up with Dr. Alain Marion.  5.  CLL and hypogammaglobulinemia.  Follow-up with oncology.   cc: Plotnikov, Evie Lacks, MD [address] [place], [place] [address]

## 2017-09-18 NOTE — Patient Instructions (Signed)
We have referred you to see Dr. Constance Holster at [facility] on 09/24/17 at 10:20am. Please bring your insurance cards and co-pay to your appointment. If you have questions or need to reschedule, please contact their office at [phone].   [facility] [address], [place] [address]  Thank you for choosing me and [facility].  Pricilla Riffle. Dagoberto Ligas., MD., Marval Regal

## 2017-09-19 ENCOUNTER — Encounter: Payer: Self-pay | Admitting: Internal Medicine

## 2017-09-19 ENCOUNTER — Ambulatory Visit: Payer: Self-pay | Admitting: *Deleted

## 2017-09-19 ENCOUNTER — Encounter (HOSPITAL_COMMUNITY): Payer: Self-pay | Admitting: Emergency Medicine

## 2017-09-19 ENCOUNTER — Emergency Department (HOSPITAL_COMMUNITY)
Admission: EM | Admit: 2017-09-19 | Discharge: 2017-09-19 | Disposition: A | Discharge status: Home / Self Care | Payer: Medicare Other | Attending: Emergency Medicine | Admitting: Emergency Medicine

## 2017-09-19 ENCOUNTER — Ambulatory Visit (INDEPENDENT_AMBULATORY_CARE_PROVIDER_SITE_OTHER): Payer: Medicare Other | Admitting: Internal Medicine

## 2017-09-19 VITALS — BP 144/90 | HR 112 | Temp 98.1°F | Resp 16 | Wt 133.0 lb

## 2017-09-19 DIAGNOSIS — Z79899 Other long term (current) drug therapy: Secondary | ICD-10-CM | POA: Insufficient documentation

## 2017-09-19 DIAGNOSIS — R531 Weakness: Secondary | ICD-10-CM

## 2017-09-19 DIAGNOSIS — I10 Essential (primary) hypertension: Secondary | ICD-10-CM | POA: Insufficient documentation

## 2017-09-19 DIAGNOSIS — Z7982 Long term (current) use of aspirin: Secondary | ICD-10-CM | POA: Insufficient documentation

## 2017-09-19 DIAGNOSIS — N390 Urinary tract infection, site not specified: Secondary | ICD-10-CM | POA: Diagnosis not present

## 2017-09-19 DIAGNOSIS — R197 Diarrhea, unspecified: Secondary | ICD-10-CM | POA: Diagnosis not present

## 2017-09-19 LAB — CBC WITH DIFFERENTIAL/PLATELET
BASOS ABS: 0 10*3/uL (ref 0.0–0.1)
BASOS PCT: 0 %
Eosinophils Absolute: 0 10*3/uL (ref 0.0–0.7)
Eosinophils Relative: 0 %
HEMATOCRIT: 39.8 % (ref 36.0–46.0)
Hemoglobin: 13.7 g/dL (ref 12.0–15.0)
Lymphocytes Relative: 23 %
Lymphs Abs: 1.3 10*3/uL (ref 0.7–4.0)
MCH: 32.3 pg (ref 26.0–34.0)
MCHC: 34.4 g/dL (ref 30.0–36.0)
MCV: 93.9 fL (ref 78.0–100.0)
MONO ABS: 0.5 10*3/uL (ref 0.1–1.0)
Monocytes Relative: 9 %
Neutro Abs: 3.8 10*3/uL (ref 1.7–7.7)
Neutrophils Relative %: 68 %
PLATELETS: 178 10*3/uL (ref 150–400)
RBC: 4.24 MIL/uL (ref 3.87–5.11)
RDW: 14 % (ref 11.5–15.5)
WBC: 5.6 10*3/uL (ref 4.0–10.5)

## 2017-09-19 LAB — COMPREHENSIVE METABOLIC PANEL
ALK PHOS: 41 U/L (ref 38–126)
ALT: 22 U/L (ref 14–54)
AST: 25 U/L (ref 15–41)
Albumin: 3.8 g/dL (ref 3.5–5.0)
Anion gap: 11 (ref 5–15)
BILIRUBIN TOTAL: 0.4 mg/dL (ref 0.3–1.2)
BUN: 10 mg/dL (ref 6–20)
CALCIUM: 9.4 mg/dL (ref 8.9–10.3)
CO2: 28 mmol/L (ref 22–32)
Chloride: 99 mmol/L — ABNORMAL LOW (ref 101–111)
Creatinine, Ser: 0.79 mg/dL (ref 0.44–1.00)
GLUCOSE: 119 mg/dL — AB (ref 65–99)
POTASSIUM: 3.4 mmol/L — AB (ref 3.5–5.1)
Sodium: 138 mmol/L (ref 135–145)
TOTAL PROTEIN: 7.5 g/dL (ref 6.5–8.1)

## 2017-09-19 LAB — URINE CULTURE

## 2017-09-19 LAB — URINALYSIS, ROUTINE W REFLEX MICROSCOPIC
BILIRUBIN URINE: NEGATIVE
GLUCOSE, UA: NEGATIVE mg/dL
Hgb urine dipstick: NEGATIVE
KETONES UR: 5 mg/dL — AB
Leukocytes, UA: NEGATIVE
Nitrite: NEGATIVE
PROTEIN: NEGATIVE mg/dL
Specific Gravity, Urine: 1.006 (ref 1.005–1.030)
pH: 7 (ref 5.0–8.0)

## 2017-09-19 LAB — CBG MONITORING, ED: Glucose-Capillary: 116 mg/dL — ABNORMAL HIGH (ref 65–99)

## 2017-09-19 LAB — I-STAT CG4 LACTIC ACID, ED: Lactic Acid, Venous: 0.99 mmol/L (ref 0.5–1.9)

## 2017-09-19 MED ORDER — SODIUM CHLORIDE 0.9 % IV BOLUS (SEPSIS): 1000.0000 mL | Freq: Once | INTRAVENOUS | Status: DC

## 2017-09-19 MED ORDER — SODIUM CHLORIDE 0.9 % IV BOLUS (SEPSIS)
1000.0000 mL | Freq: Once | INTRAVENOUS | Status: AC
  Administered 2017-09-19: 1000 mL via INTRAVENOUS

## 2017-09-19 NOTE — Assessment & Plan Note (Signed)
Current infection related to AEROCOCCUS VIRIDANS Taking Keflex 4 times daily, but symptoms have worsened-may need a different antibiotic Concern over failure with outpatient treatment Given generalized weakness and other symptoms she will go to the emergency room for further evaluation, treatment and probably admittance She likely needs to go to a nursing home for failure to thrive and inability to care for herself at home in addition to her current active problems

## 2017-09-19 NOTE — ED Triage Notes (Signed)
[place] phones prior to her arrival to tell us of her impending arrival. She is currently being treated for u.t.i. And has had diarrhea and profound weakness recently. Plan to see in E.D. And probably admit.

## 2017-09-19 NOTE — Telephone Encounter (Signed)
Pt  Being  Treated  For  Uti   Taking  Anti biotics  . Started  Having  Diarrhea  Last   Pm.    Feels  Weak.  Drinking lots fluid . Mouth feels  Dry . No  Availability  With Dr  Alain Marion tay  appt  Made  Today with Billey Gosling    Reason for Disposition . [1] MODERATE diarrhea (e.g., 4-6 times / day more than normal) AND [2] age > 70 years  Answer Assessment - Initial Assessment Questions 1. DIARRHEA SEVERITY: "How bad is the diarrhea?" "How many extra stools have you had in the past 24 hours than normal?"    - MILD: Few loose or mushy BMs; increase of 1-3 stools over normal daily number of stools; mild increase in ostomy output.   - MODERATE: Increase of 4-6 stools daily over normal; moderate increase in ostomy output.   - SEVERE (or Worst Possible): Increase of 7 or more stools daily over normal; moderate increase in ostomy output; incontinence.        3  Loose  Stools    2. ONSET: "When did the diarrhea begin?"        Last  Night     3. BM CONSISTENCY: "How loose or watery is the diarrhea?"          Watery     4. VOMITING: "Are you also vomiting?" If so, ask: "How many times in the past 24 hours?"          No   5. ABDOMINAL PAIN: "Are you having any abdominal pain?" If yes: "What does it feel like?" (e.g., crampy, dull, intermittent, constant)           Pt  Reports  No  Abdominal  pain 6. ABDOMINAL PAIN SEVERITY: If present, ask: "How bad is the pain?"  (e.g., Scale 1-10; mild, moderate, or severe)    - MILD (1-3): doesn't interfere with normal activities, abdomen soft and not tender to touch     - MODERATE (4-7): interferes with normal activities or awakens from sleep, tender to touch     - SEVERE (8-10): excruciating pain, doubled over, unable to do any normal activities       no 7. ORAL INTAKE: If vomiting, "Have you been able to drink liquids?" "How much fluids have you had in the past 24 hours?"     Lots  Water   8. HYDRATION: "Any signs of dehydration?" (e.g., dry mouth [not just  dry lips], too weak to stand, dizziness, new weight loss) "When did you last urinate?"        Weak   -  Mouth is  Dry    9. EXPOSURE: "Have you traveled to a foreign country recently?" "Have you been exposed to anyone with diarrhea?" "Could you have eaten any food that was spoiled?"       No 10. OTHER SYMPTOMS: "Do you have any other symptoms?" (e.g., fever, blood in stool)       Acid  Reflux  Being  Treated for Uti  Taking  Anti  Biotics     11. PREGNANCY: "Is there any chance you are pregnant?" "When was your last menstrual period?"       N/a  Protocols used: DIARRHEA-A-AH

## 2017-09-19 NOTE — ED Triage Notes (Signed)
Patient here with son stating she was to go to [facility] but could not go from doctors office. Pt been on antibiotics since Sunday for UTI and is now having worsening diarrhea and weakness. States she is unable to care for herself. Son is currently living with her but he is unable to help tale care of her and ADLs. Pt reports using walker at home but is unable to use due to weakness.

## 2017-09-19 NOTE — Progress Notes (Signed)
Consult request has been received. CSW attempting to follow up at present time.  CSW received a call from [facility] stating pt does not meet inpatient criteria and is requesting assistance with SNF placement, specifically to [facility].  Per notes, pt's PCP has provided FL-2 but [facility] stated pt needed hospitalization for SNF placement to [facility].  Pt has complaints of weakness/generalized weakenss since February 5th, per notes.   Per pt pt was at [facility] in December and was D/C'd home with [facility]  Per pt, HH called and pt told [facility] she was seeking admittance to the hospital and would not need HH at this time.  Per pt, she last spoke to her [place] on the morning of 2/27.  Per pt, she would prefer going home but desired to SNF since, "my son can't take car of me all the time".  Pt stated she could not "get up or get around without help" and her son "can't take me to the bathroom, you know".    CSW explained how pt's inability to get up or get around due to generalized weakness would be seen as custodial by [facility] and that this is possibly why [facility] had received an FL-2 from her PCP, but [facility] stated pt would "still need hospitalization".  CSW counseled pt how pt could D/C home and a RN CM would call pt in the morning to insure [facility] was still providing services or would re-engage [facility] to continue services for the pt with RN, AIDE, PT/OT and social work so if necessary pt could consider future placement from home if still needed at a later date.   Pt was agreeable stating, "I would rather go home than go to the hospital or to [facility] and I want to go home and get stronger so I can cook for myself again". Pt stated she could cook fo rherself until last week and remarked, "I still have some fight left in me".  CSW met with EDP and EDP placed a CM consult with order for face-to-face and for [facility] with RN, Aide, PT/OT and social work.  Please reconsult if future social work  needs arise.  CSW signing off, as social work intervention is no longer needed.  Alphonse Guild. Harutyun Monteverde, LCSW, LCAS, CSI Clinical Social Worker Ph: [phone]

## 2017-09-19 NOTE — Progress Notes (Signed)
Subjective:    Patient ID: [name], female    DOB: 1925-09-08, 84 y.o.   MRN: [id number]  HPI The patient is here for an acute visit.  Fatigue, weakness:  She saw dr Odessa Fleming on 2/20.  On that day she was complaining of generalized weakness, stool incontinence and felt that she was having difficulty taking care of herself.  She wanted to go to a nursing home to get stronger.  She was having some difficulty getting into the nursing home immediately.  She was having difficulty getting into the nursing home quickly.  She went to the emergency room on 2/24 for generalized weakness, constipation and fullness/discomfort in her suprapubic area.  She was found to have a UTI and was given 1 g of Rocephin and some IV fluids.  Chest x-ray at that time showed a persistent spiculated nodule in the right upper lobe and she will need a CT scan of the chest as an outpatient.  She was discharged home with Keflex 4 times daily.  Since that time she states she is not feeling better, but actually states she feels worse.  She is still experiencing suprapubic pain that has worsened.  She is having urinary frequency and pain with urination.  A few days ago she started having diarrhea, but this is not entirely new.  The diarrhea was more severe this morning-she had 3 episodes.  She denies any blood in the stool.  She continues to be very fatigued and is not eating much.  She has been experiencing chills, but denies fevers.  She does not feel that she is able to care for herself at home and she does live alone.     Medications and allergies reviewed with patient and updated if appropriate.  Patient Active Problem List   Diagnosis Date Noted  . Constipation 09/12/2017  . Memory loss 07/25/2017  . Neck pain on left side 07/25/2017  . Urinary tract infection, recurrent 06/12/2017  . Thrush 02/10/2017  . Frequent urination 02/10/2017  . Osteoporosis 11/23/2016  . Abdominal pain 08/16/2016  . Urinary  frequency 08/16/2016  . Peripheral edema 08/16/2016  . General weakness 08/16/2016  . Gait disorder 08/16/2016  . Dysuria 08/03/2016  . Syncope and collapse 05/10/2016  . Closed displaced simple supracondylar fracture of right humerus without intercondylar fracture 05/10/2016  . Urinary tract infection without hematuria 05/10/2016  . Syncope 05/10/2016  . Humeral head fracture, right, closed, initial encounter   . Burning tongue 03/25/2016  . Post concussion syndrome 03/03/2016  . Fall 03/03/2016  . Cystocele 03/05/2015  . Dehydration 02/08/2015  . Hyponatremia 02/08/2015  . Well adult exam 10/01/2014  . Grief at loss of child 12/11/2013  . Irregular heart beat 09/29/2013  . Humeral head fracture 08/26/2013  . Actinic keratoses 08/26/2013  . Carotid bruit 05/02/2012  . Anxiety 03/17/2011  . ANEMIA, PERNICIOUS, HX OF 10/10/2007  . Peripheral neuropathy ([place]) 09/04/2007  . Chronic fatigue 08/06/2007  . Chronic lymphocytic leukemia ([place]) 06/17/2007  . Allergic rhinitis 06/17/2007  . OSTEOARTHRITIS 06/17/2007  . B12 deficiency 06/07/2007  . GERD (gastroesophageal reflux disease) 04/30/2007  . Dyslipidemia 12/19/2006  . Essential hypertension 12/19/2006  . DIVERTICULOSIS, COLON 05/19/2003  . COLONIC POLYPS, HX OF 01/10/2000    Current Outpatient Medications on File Prior to Visit  Medication Sig Dispense Refill  . acetaminophen (TYLENOL) 500 MG tablet Take 500 mg by mouth 2 (two) times daily.     Marland Kitchen amLODipine (NORVASC) 2.5 MG tablet Take  1 tablet (2.5 mg total) by mouth daily. 90 tablet 3  . aspirin 81 MG EC tablet Take 81 mg by mouth daily.      . B-D INS SYRINGE 0.5CC/30GX1/2" 30G X 1/2" 0.5 ML MISC USE TO ADMINISTER B12 INJECTIONS 10 each 1  . Bioflavonoid Products (GRAPE SEED PO) Take 1 tablet by mouth daily.    . cephALEXin (KEFLEX) 500 MG capsule Take 1 capsule (500 mg total) by mouth 4 (four) times daily. 20 capsule 0  . Cholecalciferol (VITAMIN D3) 2000 units capsule  Take 1 capsule (2,000 Units total) by mouth daily. 100 capsule 3  . clotrimazole-betamethasone (LOTRISONE) cream Apply to affected lower vaginal area, 2 times daily, until healed 30 g 0  . conjugated estrogens (PREMARIN) vaginal cream 1/2 gram vaginally every night x 1 week, then twice weekly at bedtime 90 g 3  . cromolyn (OPTICROM) 4 % ophthalmic solution 1 DROP IN BOTH EYES FOUR TIMES A DAY  12  . cyanocobalamin (,VITAMIN B-12,) 1000 MCG/ML injection ADMINISTER 1 CC SUBQ EVERY 4 WEEKS AS DIRECTED 10 mL 0  . donepezil (ARICEPT) 5 MG tablet Take 1 tablet (5 mg total) by mouth at bedtime. 30 tablet 5  . Erythromycin 2 % ointment Apply to affected area 2 times daily 25 g 0  . fluticasone (FLONASE) 50 MCG/ACT nasal spray Place 2 sprays into both nostrils daily. 48 g 0  . folic acid (FOLVITE) 1 MG tablet Take 1 mg by mouth daily. For restless legs    . gabapentin (NEURONTIN) 300 MG capsule TAKE 1 CAPSULE (300 MG TOTAL) BY MOUTH DAILY. 90 capsule 1  . GINSENG PO Take 1 tablet by mouth daily.    . hydrochlorothiazide (MICROZIDE) 12.5 MG capsule Take 1 capsule (12.5 mg total) by mouth daily. For blood pressure 90 capsule 1  . KLOR-CON M10 10 MEQ tablet TAKE 1 TABLET (10 MEQ TOTAL) BY MOUTH DAILY. 90 tablet 1  . latanoprost (XALATAN) 0.005 % ophthalmic solution Place 1 drop into both eyes at bedtime. 6 mL 1  . loratadine (CLARITIN) 10 MG tablet TAKE 1 TABLET (10 MG TOTAL) BY MOUTH DAILY. 90 tablet 0  . MAGNESIUM CHLORIDE PO Take 1 tablet by mouth daily.    . Multiple Vitamins-Minerals (CENTRUM SILVER 50+WOMEN) TABS Take by mouth.    . rosuvastatin (CRESTOR) 5 MG tablet Take 1 tablet (5 mg total) by mouth daily. 90 tablet 3  . sucralfate (CARAFATE) 1 g tablet Take 1 tablet (1 g total) by mouth 4 (four) times daily -  with meals and at bedtime. 120 tablet 5  . vitamin B-12 (CYANOCOBALAMIN) 1000 MCG tablet Take 1 tablet (1,000 mcg total) 3 (three) times a week by mouth.    . Dexlansoprazole (DEXILANT) 30 MG  capsule Take 1 capsule (30 mg total) by mouth daily. 90 capsule 3   No current facility-administered medications on file prior to visit.     Past Medical History:  Diagnosis Date  . Allergy    rhinitis  . Chronic ethmoidal sinusitis   . Diverticulosis   . Diverticulosis of colon   . GERD (gastroesophageal reflux disease)   . Glaucoma   . Hx of colonic polyps   . Hyperlipidemia   . Hypertension   . Hypogammaglobulinemia ([place])    Monthly IVIG  . Leukemia ([place])    Chronic lymphocytic leukemia  . Osteoarthritis   . Peripheral neuropathy   . Pernicious anemia   . UTI (lower urinary tract infection) 2010  .  Vitamin B12 deficiency     Past Surgical History:  Procedure Laterality Date  . ABDOMINAL HYSTERECTOMY  1987  . bilateral cataracts  2007  . bladder tack  1987  . L4-L5 Laminetomy  1999  . TUBAL LIGATION    . tubaligation  1961  . ureter reconstruction  1992    Social History   Socioeconomic History  . Marital status: Married    Spouse name: None  . Number of children: None  . Years of education: None  . Highest education level: None  Social Needs  . Financial resource strain: None  . Food insecurity - worry: None  . Food insecurity - inability: None  . Transportation needs - medical: None  . Transportation needs - non-medical: None  Occupational History  . None  Tobacco Use  . Smoking status: Never Smoker  . Smokeless tobacco: Never Used  Substance and Sexual Activity  . Alcohol use: No    Alcohol/week: 0.0 oz  . Drug use: No  . Sexual activity: No  Other Topics Concern  . None  Social History Narrative  . None    Family History  Problem Relation Age of Onset  . Jaundice Father   . Stroke Son 42       in NH  . Cancer Mother        uterine  . Diabetes Mother   . Other Sister        brain tumor  . Cancer Sister   . Hypertension Other   . Cancer Other        breast  . Thyroid disease Sister   . Cancer Sister        thyroid cancer     Review of Systems  Constitutional: Positive for appetite change (decreased), chills and fatigue (generalized weakness). Negative for fever.  Respiratory: Positive for shortness of breath (with any exertion). Negative for cough and wheezing.   Cardiovascular: Positive for leg swelling. Negative for chest pain and palpitations.  Gastrointestinal: Positive for abdominal pain (worse since starting the antibiotic) and diarrhea. Negative for blood in stool, nausea and vomiting.  Genitourinary: Positive for dysuria and frequency. Negative for hematuria.  Neurological: Negative for light-headedness and headaches.       Objective:   Vitals:   09/19/17 1405  BP: (!) 144/90  Pulse: (!) 112  Resp: 16  Temp: 98.1 F (36.7 C)  SpO2: 96%   Wt Readings from Last 3 Encounters:  09/19/17 133 lb (60.3 kg)  09/18/17 130 lb (59 kg)  09/12/17 132 lb (59.9 kg)   Body mass index is 23.56 kg/m.   Physical Exam  Constitutional: She is oriented to person, place, and time.  Elderly, frail female in wheelchair, no acute distress  HENT:  Head: Normocephalic and atraumatic.  Right Ear: External ear normal.  Left Ear: External ear normal.  Mouth/Throat: Oropharynx is clear and moist.  Eyes: Conjunctivae are normal.  Neck: Neck supple. No tracheal deviation present. No thyromegaly present.  Cardiovascular: Normal rate and regular rhythm.  Pulmonary/Chest: Effort normal and breath sounds normal. No respiratory distress. She has no wheezes. She has no rales.  Abdominal: Soft. She exhibits no distension. There is tenderness (Suprapubic tenderness). There is guarding. There is no rebound.  Musculoskeletal: She exhibits no edema.  Lymphadenopathy:    She has no cervical adenopathy.  Neurological: She is alert and oriented to person, place, and time.  Skin: Skin is warm and dry.  Assessment & Plan:    See Problem List for Assessment and Plan of chronic medical problems.

## 2017-09-19 NOTE — Assessment & Plan Note (Signed)
Has been experiencing some diarrhea, worse this morning-her current antibiotic may be making this worse Will likely need further evaluation especially since she is on an antibiotic Will be going to the emergency room for further evaluation and treatment given her other current medical problems and generalized weakness and inability to care for herself

## 2017-09-19 NOTE — Patient Instructions (Signed)
Go to the [facility] long emergency room for further evaluation and treatment

## 2017-09-19 NOTE — ED Provider Notes (Signed)
[facility] DEPT Provider Note   CSN: [id number] Arrival date & time: 09/19/17  1439     History   Chief Complaint Chief Complaint  Patient presents with  . Weakness  . Nursing home placement    HPI [name] is a 84 y.o. female.  HPI   [name] is an 84yo female with a history of hypertension, hyperlipidemia, CLL, anemia, GERD who presents to the emergency department via her PCP office for weakness and suprapubic pain.  Patient was seen in the emergency department three days ago where she was diagnosed with a UTI and treated with rocephin and d/c with keflex BID. Patient states that despite taking keflex BID since her ED visit, she continues to have suprapubic tenderness which "feels like I have to go to the bathroom." States that she currently lives alone and is so weak that she has trouble walking in her home without feeling like her legs are going to give out from under her. Reports feeling weak for about two weeks, but it has worsened in the past few days. Denies fall. She also reports four watery episodes of diarrhea today, although states that she has had diarrhea in the past and this is not necessarily a new problem. Patient denies fever, chills, headache, vision loss, numbness, headache, dizziness, shortness of breath, chest pain, cough, nausea/vomiting, hematochezia, melena, hematuria, dysuria, urinary frequency, flank pain. States that she has been eating, although has a reduced appetite. Patient reports that she has been accepted to [facility] facility.  Past Medical History:  Diagnosis Date  . Allergy    rhinitis  . Chronic ethmoidal sinusitis   . Diverticulosis   . Diverticulosis of colon   . GERD (gastroesophageal reflux disease)   . Glaucoma   . Hx of colonic polyps   . Hyperlipidemia   . Hypertension   . Hypogammaglobulinemia ([place])    Monthly IVIG  . Leukemia ([place])    Chronic lymphocytic leukemia  . Osteoarthritis   .  Peripheral neuropathy   . Pernicious anemia   . UTI (lower urinary tract infection) 2010  . Vitamin B12 deficiency     Patient Active Problem List   Diagnosis Date Noted  . Constipation 09/12/2017  . Memory loss 07/25/2017  . Neck pain on left side 07/25/2017  . Urinary tract infection, recurrent 06/12/2017  . Thrush 02/10/2017  . Frequent urination 02/10/2017  . Osteoporosis 11/23/2016  . Abdominal pain 08/16/2016  . Urinary frequency 08/16/2016  . Peripheral edema 08/16/2016  . General weakness 08/16/2016  . Gait disorder 08/16/2016  . Dysuria 08/03/2016  . Syncope and collapse 05/10/2016  . Closed displaced simple supracondylar fracture of right humerus without intercondylar fracture 05/10/2016  . Urinary tract infection without hematuria 05/10/2016  . Syncope 05/10/2016  . Humeral head fracture, right, closed, initial encounter   . Burning tongue 03/25/2016  . Post concussion syndrome 03/03/2016  . Fall 03/03/2016  . Cystocele 03/05/2015  . Dehydration 02/08/2015  . Hyponatremia 02/08/2015  . Well adult exam 10/01/2014  . Grief at loss of child 12/11/2013  . Irregular heart beat 09/29/2013  . Humeral head fracture 08/26/2013  . Actinic keratoses 08/26/2013  . Carotid bruit 05/02/2012  . Anxiety 03/17/2011  . ANEMIA, PERNICIOUS, HX OF 10/10/2007  . Peripheral neuropathy ([place]) 09/04/2007  . Chronic fatigue 08/06/2007  . Chronic lymphocytic leukemia ([place]) 06/17/2007  . Allergic rhinitis 06/17/2007  . OSTEOARTHRITIS 06/17/2007  . B12 deficiency 06/07/2007  . GERD (gastroesophageal reflux disease)  04/30/2007  . Dyslipidemia 12/19/2006  . Essential hypertension 12/19/2006  . DIVERTICULOSIS, COLON 05/19/2003  . COLONIC POLYPS, HX OF 01/10/2000    Past Surgical History:  Procedure Laterality Date  . ABDOMINAL HYSTERECTOMY  1987  . bilateral cataracts  2007  . bladder tack  1987  . L4-L5 Laminetomy  1999  . TUBAL LIGATION    . tubaligation  1961  . ureter  reconstruction  1992    OB History    Gravida Para Term Preterm AB Living   4 4       4    SAB TAB Ectopic Multiple Live Births                   Home Medications    Prior to Admission medications   Medication Sig Start Date End Date Taking? Authorizing Provider  acetaminophen (TYLENOL) 500 MG tablet Take 500 mg by mouth 2 (two) times daily.     [provider]  amLODipine (NORVASC) 2.5 MG tablet Take 1 tablet (2.5 mg total) by mouth daily. 02/27/17   Plotnikov, Evie Lacks, MD  aspirin 81 MG EC tablet Take 81 mg by mouth daily.      [provider]  B-D INS SYRINGE 0.5CC/30GX1/2" 30G X 1/2" 0.5 ML MISC USE TO ADMINISTER B12 INJECTIONS 06/13/17   Plotnikov, Evie Lacks, MD  Bioflavonoid Products (GRAPE SEED PO) Take 1 tablet by mouth daily.    [provider]  cephALEXin (KEFLEX) 500 MG capsule Take 1 capsule (500 mg total) by mouth 4 (four) times daily. 09/16/17   Nat Christen, MD  Cholecalciferol (VITAMIN D3) 2000 units capsule Take 1 capsule (2,000 Units total) by mouth daily. 11/23/16   Plotnikov, Evie Lacks, MD  clotrimazole-betamethasone (LOTRISONE) cream Apply to affected lower vaginal area, 2 times daily, until healed 06/19/17   Daleen Bo, MD  conjugated estrogens (PREMARIN) vaginal cream 1/2 gram vaginally every night x 1 week, then twice weekly at bedtime 12/29/15   Salvadore Dom, MD  cromolyn (OPTICROM) 4 % ophthalmic solution 1 DROP IN BOTH EYES FOUR TIMES A DAY 03/21/16   [provider]  cyanocobalamin (,VITAMIN B-12,) 1000 MCG/ML injection ADMINISTER 1 CC SUBQ EVERY 4 WEEKS AS DIRECTED 08/07/16   Plotnikov, Evie Lacks, MD  Dexlansoprazole (DEXILANT) 30 MG capsule Take 1 capsule (30 mg total) by mouth daily. 07/25/17 08/24/17  Plotnikov, Evie Lacks, MD  donepezil (ARICEPT) 5 MG tablet Take 1 tablet (5 mg total) by mouth at bedtime. 07/25/17   Plotnikov, Evie Lacks, MD  Erythromycin 2 % ointment Apply to affected area 2 times daily 05/12/17   Dorie Rank, MD  fluticasone [facility]) 50 MCG/ACT nasal spray Place 2 sprays into both nostrils daily. 11/21/16   Plotnikov, Evie Lacks, MD  folic acid (FOLVITE) 1 MG tablet Take 1 mg by mouth daily. For restless legs    [provider]  gabapentin (NEURONTIN) 300 MG capsule TAKE 1 CAPSULE (300 MG TOTAL) BY MOUTH DAILY. 09/06/17   Plotnikov, Evie Lacks, MD  GINSENG PO Take 1 tablet by mouth daily.    [provider]  hydrochlorothiazide (MICROZIDE) 12.5 MG capsule Take 1 capsule (12.5 mg total) by mouth daily. For blood pressure 07/12/17   Plotnikov, Evie Lacks, MD  KLOR-CON M10 10 MEQ tablet TAKE 1 TABLET (10 MEQ TOTAL) BY MOUTH DAILY. 05/07/17   Plotnikov, Evie Lacks, MD  latanoprost (XALATAN) 0.005 % ophthalmic solution Place 1 drop into both eyes at bedtime. 06/18/17   Plotnikov, Tyrone Apple  V, MD  loratadine (CLARITIN) 10 MG tablet TAKE 1 TABLET (10 MG TOTAL) BY MOUTH DAILY. 08/23/17   Plotnikov, Evie Lacks, MD  MAGNESIUM CHLORIDE PO Take 1 tablet by mouth daily.    [provider]  Multiple Vitamins-Minerals (CENTRUM SILVER 50+WOMEN) TABS Take by mouth.    [provider]  rosuvastatin (CRESTOR) 5 MG tablet Take 1 tablet (5 mg total) by mouth daily. 08/21/17   Plotnikov, Evie Lacks, MD  sucralfate (CARAFATE) 1 g tablet Take 1 tablet (1 g total) by mouth 4 (four) times daily -  with meals and at bedtime. 08/07/17   Plotnikov, Evie Lacks, MD  vitamin B-12 (CYANOCOBALAMIN) 1000 MCG tablet Take 1 tablet (1,000 mcg total) 3 (three) times a week by mouth. 05/30/17   Ladell Pier, MD    Family History Family History  Problem Relation Age of Onset  . Jaundice Father   . Stroke Son 24       in NH  . Cancer Mother        uterine  . Diabetes Mother   . Other Sister        brain tumor  . Cancer Sister   . Hypertension Other   . Cancer Other        breast  . Thyroid disease Sister   . Cancer Sister        thyroid cancer    Social History Social History   Tobacco Use  .  Smoking status: Never Smoker  . Smokeless tobacco: Never Used  Substance Use Topics  . Alcohol use: No    Alcohol/week: 0.0 oz  . Drug use: No     Allergies   Pneumococcal vaccines; Clarithromycin; Hctz [hydrochlorothiazide]; Oxycodone-aspirin; and Propoxyphene n-acetaminophen   Review of Systems Review of Systems  Constitutional: Negative for chills and fever.  Eyes: Negative for visual disturbance.  Respiratory: Negative for cough and shortness of breath.   Cardiovascular: Negative for chest pain.  Gastrointestinal: Positive for abdominal pain (suprapubic) and diarrhea. Negative for constipation, nausea and vomiting.  Genitourinary: Negative for difficulty urinating, dysuria, flank pain, frequency and hematuria.  Musculoskeletal: Positive for gait problem (feels weak, as if she cannot walk). Negative for myalgias.  Skin: Negative for rash.  Neurological: Positive for weakness. Negative for dizziness, syncope, light-headedness, numbness and headaches.  Psychiatric/Behavioral: Negative for agitation.     Physical Exam Updated Vital Signs BP 132/78   Pulse 85   Temp 98.3 F (36.8 C) (Oral)   Resp 18   SpO2 96%   Physical Exam  Constitutional: She is oriented to person, place, and time. She appears well-developed and well-nourished. No distress.  HENT:  Head: Normocephalic and atraumatic.  Mouth/Throat: Oropharynx is clear and moist. No oropharyngeal exudate.  Mucous membranes moist.   Eyes: Conjunctivae are normal. Pupils are equal, round, and reactive to light. Right eye exhibits no discharge. Left eye exhibits no discharge.  Neck: Normal range of motion. Neck supple.  Cardiovascular: Normal rate, regular rhythm and intact distal pulses. Exam reveals no friction rub.  No murmur heard. Pulmonary/Chest: Effort normal and breath sounds normal. No stridor. No respiratory distress. She has no wheezes. She has no rales.  Abdominal: Soft. Bowel sounds are normal.  Mild  tenderness to palpation in suprapubic area. No guarding or rigidity. No rebound tenderness. No CVA tenderness.   Musculoskeletal:  Strength 4/5 in bilateral UE and LE.   Neurological: She is alert and oriented to person, place, and time. Coordination normal.  Patient  requires one person assist to ambulate. Normal in coordination, although slow.   Skin: Skin is warm and dry. Capillary refill takes less than 2 seconds. She is not diaphoretic.  Psychiatric: She has a normal mood and affect. Her behavior is normal.  Nursing note and vitals reviewed.    ED Treatments / Results  Labs (all labs ordered are listed, but only abnormal results are displayed) Labs Reviewed  URINALYSIS, ROUTINE W REFLEX MICROSCOPIC - Abnormal; Notable for the following components:      Result Value   Color, Urine STRAW (*)    Ketones, ur 5 (*)    All other components within normal limits  COMPREHENSIVE METABOLIC PANEL - Abnormal; Notable for the following components:   Potassium 3.4 (*)    Chloride 99 (*)    Glucose, Bld 119 (*)    All other components within normal limits  CBG MONITORING, ED - Abnormal; Notable for the following components:   Glucose-Capillary 116 (*)    All other components within normal limits  CULTURE, BLOOD (ROUTINE X 2)  CULTURE, BLOOD (ROUTINE X 2)  CBC WITH DIFFERENTIAL/PLATELET  I-STAT CG4 LACTIC ACID, ED    EKG  EKG Interpretation  Date/Time:  Wednesday September 19 2017 14:53:14 EST Ventricular Rate:  87 PR Interval:    QRS Duration: 82 QT Interval:  370 QTC Calculation: 446 R Axis:   8 Text Interpretation:  Sinus rhythm Prolonged PR interval No significant change since last tracing Confirmed by Gareth Morgan [phone]) on 09/19/2017 4:05:02 PM       Radiology No results found.  Procedures Procedures (including critical care time)  Medications Ordered in ED Medications  sodium chloride 0.9 % bolus 1,000 mL (not administered)  sodium chloride 0.9 % bolus 1,000 mL  (1,000 mLs Intravenous New Bag/Given 09/19/17 1725)     Initial Impression / Assessment and Plan / ED Course  I have reviewed the triage vital signs and the nursing notes.  Pertinent labs & imaging results that were available during my care of the patient were reviewed by me and considered in my medical decision making (see chart for details).    Presents with weakness, ongoing for several weeks. Is currently being treated for a UTI. On exam she is afebrile and non-toxic appearing. She has 4/5 strength in bilateral upper and lower extremities and requires one person assist to ambulate. Concern for failed outpatient treatment of her UTI given she continues to have suprapubic "pressure-like" sensation. She appears well hydrated, able to tolerate PO fluids. Abdominal exam with mild tenderness to palpation in the suprapubic area.   Results reviewed. Largely unremarkable. UA without evidence of infection. CBC and CMP unremarkable. CBG 116. No lactic acidosis. Ordered C-diff study given patient reports diarrhea and recently on antibiotics, patient was unable to give a sample in the ER.   Given presentation and exam do not suspect peritonintis, perforated viscus, bowel obstruction, appendicitis, cholecystitis, diverticulitis, pancreatitis or nephrolithiasis. Her pain has been mild and stable since being diagnosed with UTI and will likely resolve with treatment.   Spoke with Dr. Billy Fischer who also saw this patient and would like to see if hospitalist will admit for weakness. Given labs and workup unremarkable and patient able to ambulate, hospitalist states patient does not meet inpatient admission. Suggest social work consult to get home health scheduled.   Social worker Jenny Reichmann Riffey saw the patient who states that she would like home health services. On recheck patient that she does not want to stay at  the hospital. I have ordered home health and case management referral. Patient's son plans on staying  with his mother until her strength returns and she can safely live independently. Counseled patient to continue with her course of keflex previously prescribed. Discussed return precautions with patient and her son at bedside who agree and voice understanding to the above plan.  Discussed plan for home health with Dr. Billy Fischer who agrees with plan.   Final Clinical Impressions(s) / ED Diagnoses   Final diagnoses:  Weakness    ED Discharge Orders    None       Bernarda Caffey 09/20/17 1520    Gareth Morgan, MD 09/20/17 1724

## 2017-09-19 NOTE — ED Notes (Signed)
Patient up to bathroom with steady, unable to provide stool sample.

## 2017-09-19 NOTE — Assessment & Plan Note (Signed)
She is experiencing generalized weakness This seems somewhat chronic, but has gotten worse with her recent UTI She is unable to care for herself and her urinary tract infection has failed outpatient treatment We will go to the emergency room for further evaluation and treatment probable placement into a nursing home

## 2017-09-19 NOTE — Discharge Instructions (Signed)
Lab work and urine was reassuring.    Please take antibiotic as prescribed.   Case management will contact you to set up home health services.   Return to the emergency department if you have weakness in which she can no longer walk, have worsening abdominal pain with fever or vomiting.  Please also return for any new or worsening symptoms.

## 2017-09-20 ENCOUNTER — Telehealth: Payer: Self-pay | Admitting: *Deleted

## 2017-09-20 ENCOUNTER — Telehealth: Payer: Self-pay | Admitting: Internal Medicine

## 2017-09-20 NOTE — Telephone Encounter (Signed)
See progress note from social worker yesterday

## 2017-09-20 NOTE — Telephone Encounter (Signed)
Copied from [place]. Topic: General - Other >> Sep 20, 2017  2:21 PM Yvette Rack wrote: Reason for CRM: Sabra from [facility] [phone] calling for verbal orders   Physical Therapy   2 times a week for 5 weeks

## 2017-09-20 NOTE — Telephone Encounter (Signed)
Post ED Visit - Positive Culture Follow-up  Culture report reviewed by antimicrobial stewardship pharmacist:  []  Elenor Quinones, Pharm.D. []  Heide Guile, Pharm.D., BCPS AQ-ID [x]  Parks Neptune, Pharm.D., BCPS []  Alycia Rossetti, Pharm.D., BCPS []  [place], [place].D., BCPS, AAHIVP []  Legrand Como, Pharm.D., BCPS, AAHIVP []  Salome Arnt, PharmD, BCPS []  Jalene Mullet, PharmD []  Vincenza Hews, PharmD, BCPS  Positive urine culture Treated with Cephalexin, organism sensitive to the same and no further patient follow-up is required at this time.  Harlon Flor [facility] 09/20/2017, 10:09 AM

## 2017-09-21 ENCOUNTER — Ambulatory Visit: Payer: Self-pay

## 2017-09-21 NOTE — Telephone Encounter (Signed)
Notified Sabra w/MD approval for verbal orders...[name]

## 2017-09-21 NOTE — Telephone Encounter (Signed)
Informed pt. That omeprazole is no longer on her medication list.Verbalizes understanding.

## 2017-09-21 NOTE — Care Management Note (Signed)
Case Management Note  Patient Details  Name: [name] MRN: [id number] Date of Birth: 03-Nov-1925  CM consulted for new HH orders to enable [facility] to start their services again.  Contacted Adacia with [facility] to advised of the new orders and that pt is currently requesting SNF placement.  No further CM needs noted at this time.  Expected Discharge Date:  09/19/2017        Expected Discharge Plan:  [place]  Discharge planning Services  CM Consult  Post Acute Care Choice:  Home Health Choice offered to:  Patient, Adult Children  HH Arranged:  RN, PT, OT, Nurse's Aide, Social Work [employer] Agency:  [facility]  Status of Service:  Completed, signed off  Rae Mar, RN 09/21/2017, 11:23 AM

## 2017-09-24 LAB — CULTURE, BLOOD (ROUTINE X 2)
CULTURE: NO GROWTH
CULTURE: NO GROWTH
SPECIAL REQUESTS: ADEQUATE
SPECIAL REQUESTS: ADEQUATE

## 2017-09-25 ENCOUNTER — Other Ambulatory Visit: Payer: Self-pay | Admitting: Internal Medicine

## 2017-09-25 ENCOUNTER — Encounter: Payer: Self-pay | Admitting: Internal Medicine

## 2017-09-25 ENCOUNTER — Other Ambulatory Visit (INDEPENDENT_AMBULATORY_CARE_PROVIDER_SITE_OTHER): Payer: Medicare Other

## 2017-09-25 ENCOUNTER — Ambulatory Visit (INDEPENDENT_AMBULATORY_CARE_PROVIDER_SITE_OTHER): Payer: Medicare Other | Admitting: Internal Medicine

## 2017-09-25 DIAGNOSIS — R531 Weakness: Secondary | ICD-10-CM | POA: Diagnosis not present

## 2017-09-25 DIAGNOSIS — Z862 Personal history of diseases of the blood and blood-forming organs and certain disorders involving the immune mechanism: Secondary | ICD-10-CM

## 2017-09-25 DIAGNOSIS — E538 Deficiency of other specified B group vitamins: Secondary | ICD-10-CM | POA: Diagnosis not present

## 2017-09-25 DIAGNOSIS — R197 Diarrhea, unspecified: Secondary | ICD-10-CM

## 2017-09-25 DIAGNOSIS — C9110 Chronic lymphocytic leukemia of B-cell type not having achieved remission: Secondary | ICD-10-CM

## 2017-09-25 LAB — URINALYSIS
Bilirubin Urine: NEGATIVE
Ketones, ur: NEGATIVE
Leukocytes, UA: NEGATIVE
NITRITE: NEGATIVE
PH: 6.5 (ref 5.0–8.0)
SPECIFIC GRAVITY, URINE: 1.01 (ref 1.000–1.030)
TOTAL PROTEIN, URINE-UPE24: NEGATIVE
Urine Glucose: NEGATIVE
Urobilinogen, UA: 0.2 (ref 0.0–1.0)

## 2017-09-25 NOTE — Assessment & Plan Note (Signed)
On B12 

## 2017-09-25 NOTE — Progress Notes (Signed)
Subjective:  Patient ID: [name], female    DOB: 09-Sep-1925  Age: 84 y.o. MRN: [id number]  CC: No chief complaint on file.   HPI [name] presents for diarrhea, weakness, gait disorder. Loose stools x18 on Sunday, 3 on Mon, 2 today... Starting PT  Outpatient Medications Prior to Visit  Medication Sig Dispense Refill  . acetaminophen (TYLENOL) 500 MG tablet Take 500 mg by mouth 2 (two) times daily.     Marland Kitchen amLODipine (NORVASC) 2.5 MG tablet Take 1 tablet (2.5 mg total) by mouth daily. 90 tablet 3  . aspirin 81 MG EC tablet Take 81 mg by mouth daily.      . B-D INS SYRINGE 0.5CC/30GX1/2" 30G X 1/2" 0.5 ML MISC USE TO ADMINISTER B12 INJECTIONS 10 each 1  . Bioflavonoid Products (GRAPE SEED PO) Take 1 tablet by mouth daily.    . cephALEXin (KEFLEX) 500 MG capsule Take 1 capsule (500 mg total) by mouth 4 (four) times daily. 20 capsule 0  . Cholecalciferol (VITAMIN D3) 2000 units capsule Take 1 capsule (2,000 Units total) by mouth daily. 100 capsule 3  . clotrimazole-betamethasone (LOTRISONE) cream Apply to affected lower vaginal area, 2 times daily, until healed 30 g 0  . conjugated estrogens (PREMARIN) vaginal cream 1/2 gram vaginally every night x 1 week, then twice weekly at bedtime 90 g 3  . cromolyn (OPTICROM) 4 % ophthalmic solution 1 DROP IN BOTH EYES FOUR TIMES A DAY  12  . cyanocobalamin (,VITAMIN B-12,) 1000 MCG/ML injection ADMINISTER 1 CC SUBQ EVERY 4 WEEKS AS DIRECTED 10 mL 0  . Dexlansoprazole 30 MG capsule Take 30 mg by mouth daily.    Marland Kitchen donepezil (ARICEPT) 5 MG tablet Take 1 tablet (5 mg total) by mouth at bedtime. 30 tablet 5  . Erythromycin 2 % ointment Apply to affected area 2 times daily 25 g 0  . fluticasone (FLONASE) 50 MCG/ACT nasal spray Place 2 sprays into both nostrils daily. 48 g 0  . folic acid (FOLVITE) 1 MG tablet Take 1 mg by mouth daily. For restless legs    . gabapentin (NEURONTIN) 300 MG capsule TAKE 1 CAPSULE (300 MG TOTAL) BY MOUTH DAILY. 90  capsule 1  . GINSENG PO Take 1 tablet by mouth daily.    . hydrochlorothiazide (MICROZIDE) 12.5 MG capsule Take 1 capsule (12.5 mg total) by mouth daily. For blood pressure 90 capsule 1  . KLOR-CON M10 10 MEQ tablet TAKE 1 TABLET (10 MEQ TOTAL) BY MOUTH DAILY. 90 tablet 1  . latanoprost (XALATAN) 0.005 % ophthalmic solution Place 1 drop into both eyes at bedtime. 6 mL 1  . loratadine (CLARITIN) 10 MG tablet TAKE 1 TABLET (10 MG TOTAL) BY MOUTH DAILY. 90 tablet 0  . MAGNESIUM CHLORIDE PO Take 1 tablet by mouth daily.    . Multiple Vitamins-Minerals (CENTRUM SILVER 50+WOMEN) TABS Take 1 tablet by mouth daily.     . rosuvastatin (CRESTOR) 5 MG tablet Take 1 tablet (5 mg total) by mouth daily. 90 tablet 3  . sucralfate (CARAFATE) 1 g tablet Take 1 tablet (1 g total) by mouth 4 (four) times daily -  with meals and at bedtime. 120 tablet 5  . Dexlansoprazole (DEXILANT) 30 MG capsule Take 1 capsule (30 mg total) by mouth daily. 90 capsule 3  . vitamin B-12 (CYANOCOBALAMIN) 1000 MCG tablet Take 1 tablet (1,000 mcg total) 3 (three) times a week by mouth.     No facility-administered medications prior to  visit.     ROS Review of Systems  Constitutional: Negative for activity change, appetite change, chills, fatigue and unexpected weight change.  HENT: Negative for congestion, mouth sores and sinus pressure.   Eyes: Negative for visual disturbance.  Respiratory: Negative for cough and chest tightness.   Gastrointestinal: Positive for diarrhea. Negative for abdominal pain and nausea.  Genitourinary: Negative for difficulty urinating, frequency and vaginal pain.  Musculoskeletal: Positive for arthralgias and gait problem. Negative for back pain.  Skin: Negative for pallor and rash.  Neurological: Positive for weakness. Negative for dizziness, tremors, numbness and headaches.  Psychiatric/Behavioral: Negative for confusion, sleep disturbance and suicidal ideas. The patient is nervous/anxious.      Objective:  BP 136/78 (BP Location: Left Arm, Patient Position: Sitting, Cuff Size: Normal)   Pulse 82   Temp 98 F (36.7 C) (Oral)   Ht 5\' 3"  (1.6 m)   Wt 133 lb (60.3 kg)   SpO2 98%   BMI 23.56 kg/m   BP Readings from Last 3 Encounters:  09/25/17 136/78  09/19/17 (!) 165/93  09/19/17 (!) 144/90    Wt Readings from Last 3 Encounters:  09/25/17 133 lb (60.3 kg)  09/19/17 133 lb (60.3 kg)  09/19/17 133 lb (60.3 kg)    Physical Exam  Constitutional: She appears well-developed. No distress.  HENT:  Head: Normocephalic.  Right Ear: External ear normal.  Left Ear: External ear normal.  Nose: Nose normal.  Mouth/Throat: Oropharynx is clear and moist.  Eyes: Conjunctivae are normal. Pupils are equal, round, and reactive to light. Right eye exhibits no discharge. Left eye exhibits no discharge.  Neck: Normal range of motion. Neck supple. No JVD present. No tracheal deviation present. No thyromegaly present.  Cardiovascular: Normal rate, regular rhythm and normal heart sounds.  Pulmonary/Chest: No stridor. No respiratory distress. She has no wheezes.  Abdominal: Soft. Bowel sounds are normal. She exhibits no distension and no mass. There is no tenderness. There is no rebound and no guarding.  Musculoskeletal: She exhibits no edema or tenderness.  Lymphadenopathy:    She has no cervical adenopathy.  Neurological: She displays normal reflexes. No cranial nerve deficit. She exhibits normal muscle tone. Coordination abnormal.  Skin: No rash noted. No erythema.  Psychiatric: She has a normal mood and affect. Her behavior is normal. Judgment and thought content normal.  in a w/c  Lab Results  Component Value Date   WBC 5.6 09/19/2017   HGB 13.7 09/19/2017   HCT 39.8 09/19/2017   PLT 178 09/19/2017   GLUCOSE 119 (H) 09/19/2017   CHOL 191 10/01/2014   TRIG 72.0 10/01/2014   HDL 57.80 10/01/2014   LDLCALC 119 (H) 10/01/2014   ALT 22 09/19/2017   AST 25 09/19/2017   NA 138  09/19/2017   K 3.4 (L) 09/19/2017   CL 99 (L) 09/19/2017   CREATININE 0.79 09/19/2017   BUN 10 09/19/2017   CO2 28 09/19/2017   TSH 3.38 12/03/2015   INR 1.05 05/10/2016   HGBA1C  10/07/2008    5.9 (NOTE)   The ADA recommends the following therapeutic goal for glycemic   control related to Hgb A1C measurement:   Goal of Therapy:   < 7.0% Hgb A1C   Reference: American Diabetes Association: Clinical Practice   Recommendations 2008, Diabetes Care,  2008, 31:(Suppl 1).   MICROALBUR 0.4 07/14/2009    No results found.  Assessment & Plan:   There are no diagnoses linked to this encounter. I have discontinued Stanton Kidney  R. Rupe's vitamin B-12. I am also having her maintain her aspirin, acetaminophen, folic acid, conjugated estrogens, cromolyn, cyanocobalamin, GINSENG PO, Bioflavonoid Products (GRAPE SEED PO), MAGNESIUM CHLORIDE PO, fluticasone, Vitamin D3, CENTRUM SILVER 50+WOMEN, amLODipine, KLOR-CON M10, Erythromycin, B-D INS SYRINGE 0.5CC/30GX1/2", latanoprost, clotrimazole-betamethasone, hydrochlorothiazide, donepezil, Dexlansoprazole, sucralfate, rosuvastatin, loratadine, gabapentin, cephALEXin, and Dexlansoprazole.  No orders of the defined types were placed in this encounter.    Follow-up: No Follow-up on file.  Walker Kehr, MD

## 2017-09-25 NOTE — Patient Instructions (Signed)
Use imodium AD if needed for diarrhea (1/2 or 1 tablet twice a day) Use Miralax if no stool for two days

## 2017-09-25 NOTE — Addendum Note (Signed)
Addended by: Isaiah Serge D on: 09/25/2017 10:29 AM   Modules accepted: Orders

## 2017-09-25 NOTE — Assessment & Plan Note (Signed)
CFS 

## 2017-09-25 NOTE — Assessment & Plan Note (Signed)
Use imodium AD if needed for diarrhea (1/2 or 1 tablet twice a day) Use Miralax if no stool for two days

## 2017-09-25 NOTE — Assessment & Plan Note (Signed)
On b12 

## 2017-09-25 NOTE — Assessment & Plan Note (Signed)
Home PT

## 2017-09-27 ENCOUNTER — Inpatient Hospital Stay: Payer: Medicare Other | Attending: Oncology

## 2017-09-27 VITALS — BP 136/71 | HR 65 | Temp 98.1°F | Resp 18

## 2017-09-27 DIAGNOSIS — D801 Nonfamilial hypogammaglobulinemia: Secondary | ICD-10-CM | POA: Insufficient documentation

## 2017-09-27 DIAGNOSIS — C9110 Chronic lymphocytic leukemia of B-cell type not having achieved remission: Secondary | ICD-10-CM

## 2017-09-27 DIAGNOSIS — Z79899 Other long term (current) drug therapy: Secondary | ICD-10-CM | POA: Insufficient documentation

## 2017-09-27 LAB — CLOSTRIDIUM DIFFICILE EIA: C DIFFICILE TOXINS A+ B, EIA: NEGATIVE

## 2017-09-27 LAB — SPECIMEN STATUS REPORT

## 2017-09-27 MED ORDER — DIPHENHYDRAMINE HCL 25 MG PO CAPS
25.0000 mg | ORAL_CAPSULE | Freq: Once | ORAL | Status: AC
  Administered 2017-09-27: 25 mg via ORAL

## 2017-09-27 MED ORDER — DIPHENHYDRAMINE HCL 25 MG PO CAPS
ORAL_CAPSULE | ORAL | Status: AC
  Filled 2017-09-27: qty 1

## 2017-09-27 MED ORDER — IMMUNE GLOBULIN (HUMAN) 10 GM/100ML IV SOLN
30.0000 g | Freq: Once | INTRAVENOUS | Status: AC
  Administered 2017-09-27: 30 g via INTRAVENOUS
  Filled 2017-09-27: qty 100

## 2017-09-27 NOTE — Progress Notes (Signed)
Patient and her son made aware that we recommend 30 minutes of post infusion observation. They choose to decline this today. VSS at d/c time.

## 2017-09-27 NOTE — Patient Instructions (Signed)

## 2017-10-01 ENCOUNTER — Telehealth: Payer: Self-pay | Admitting: Internal Medicine

## 2017-10-01 NOTE — Telephone Encounter (Signed)
Copied from [place]. Topic: Quick Communication - See Telephone Encounter >> Oct 01, 2017 11:26 AM Ivar Drape wrote: CRM for notification. See Telephone encounter for:  10/01/17. Patient would like for the PCP to prescribe something different for her acid reflux than the Dexlansoprazole 30 MG capsule because it's not working.

## 2017-10-02 NOTE — Telephone Encounter (Signed)
MD is out of the office will hold when he return on Monday.Marland KitchenJohny Parker

## 2017-10-04 ENCOUNTER — Other Ambulatory Visit: Payer: Self-pay | Admitting: Nurse Practitioner

## 2017-10-04 DIAGNOSIS — C9110 Chronic lymphocytic leukemia of B-cell type not having achieved remission: Secondary | ICD-10-CM

## 2017-10-07 MED ORDER — RABEPRAZOLE SODIUM 20 MG PO TBEC: 20.0000 mg | DELAYED_RELEASE_TABLET | Freq: Two times a day (BID) | ORAL | 11 refills | Status: DC

## 2017-10-07 NOTE — Telephone Encounter (Signed)
We can try generic AcipHex twice a day 20nmilligrams #60 with 11 refills. Thank you

## 2017-10-08 ENCOUNTER — Other Ambulatory Visit: Payer: Medicare Other

## 2017-10-08 ENCOUNTER — Ambulatory Visit (INDEPENDENT_AMBULATORY_CARE_PROVIDER_SITE_OTHER): Payer: Medicare Other | Admitting: Family

## 2017-10-08 ENCOUNTER — Encounter: Payer: Self-pay | Admitting: Family

## 2017-10-08 VITALS — BP 112/60 | HR 71 | Temp 98.1°F | Ht 63.0 in | Wt 133.1 lb

## 2017-10-08 DIAGNOSIS — R300 Dysuria: Secondary | ICD-10-CM | POA: Diagnosis not present

## 2017-10-08 LAB — POC URINALSYSI DIPSTICK (AUTOMATED)
BILIRUBIN UA: NEGATIVE
Blood, UA: NEGATIVE
Glucose, UA: NEGATIVE
Ketones, UA: NEGATIVE
LEUKOCYTES UA: NEGATIVE
NITRITE UA: NEGATIVE
PH UA: 6.5 (ref 5.0–8.0)
Protein, UA: NEGATIVE
Spec Grav, UA: 1.015 (ref 1.010–1.025)
Urobilinogen, UA: 0.2 E.U./dL

## 2017-10-08 MED ORDER — NITROFURANTOIN MONOHYD MACRO 100 MG PO CAPS: 100.0000 mg | ORAL_CAPSULE | Freq: Two times a day (BID) | ORAL | 0 refills | Status: DC

## 2017-10-08 NOTE — Progress Notes (Signed)
[name] is a 84 y.o. female with the following history as recorded in Epic[employer]:  Patient Active Problem List   Diagnosis Date Noted  . Diarrhea 09/19/2017  . Constipation 09/12/2017  . Memory loss 07/25/2017  . Neck pain on left side 07/25/2017  . Urinary tract infection, recurrent 06/12/2017  . Thrush 02/10/2017  . Frequent urination 02/10/2017  . Osteoporosis 11/23/2016  . Abdominal pain 08/16/2016  . Urinary frequency 08/16/2016  . Peripheral edema 08/16/2016  . General weakness 08/16/2016  . Gait disorder 08/16/2016  . Dysuria 08/03/2016  . Syncope and collapse 05/10/2016  . Closed displaced simple supracondylar fracture of right humerus without intercondylar fracture 05/10/2016  . Urinary tract infection without hematuria 05/10/2016  . Syncope 05/10/2016  . Humeral head fracture, right, closed, initial encounter   . Burning tongue 03/25/2016  . Post concussion syndrome 03/03/2016  . Fall 03/03/2016  . Cystocele 03/05/2015  . Dehydration 02/08/2015  . Hyponatremia 02/08/2015  . Well adult exam 10/01/2014  . Grief at loss of child 12/11/2013  . Irregular heart beat 09/29/2013  . Humeral head fracture 08/26/2013  . Actinic keratoses 08/26/2013  . Carotid bruit 05/02/2012  . Anxiety 03/17/2011  . ANEMIA, PERNICIOUS, HX OF 10/10/2007  . Peripheral neuropathy ([place]) 09/04/2007  . Chronic fatigue 08/06/2007  . Chronic lymphocytic leukemia ([place]) 06/17/2007  . Allergic rhinitis 06/17/2007  . OSTEOARTHRITIS 06/17/2007  . B12 deficiency 06/07/2007  . GERD (gastroesophageal reflux disease) 04/30/2007  . Dyslipidemia 12/19/2006  . Essential hypertension 12/19/2006  . DIVERTICULOSIS, COLON 05/19/2003  . COLONIC POLYPS, HX OF 01/10/2000    Current Outpatient Medications  Medication Sig Dispense Refill  . acetaminophen (TYLENOL) 500 MG tablet Take 500 mg by mouth 2 (two) times daily.     Marland Kitchen amLODipine (NORVASC) 2.5 MG tablet Take 1 tablet (2.5 mg total) by mouth daily.  90 tablet 3  . aspirin 81 MG EC tablet Take 81 mg by mouth daily.      . B-D INS SYRINGE 0.5CC/30GX1/2" 30G X 1/2" 0.5 ML MISC USE TO ADMINISTER B12 INJECTIONS 10 each 1  . Bioflavonoid Products (GRAPE SEED PO) Take 1 tablet by mouth daily.    . cephALEXin (KEFLEX) 500 MG capsule Take 1 capsule (500 mg total) by mouth 4 (four) times daily. 20 capsule 0  . Cholecalciferol (VITAMIN D3) 2000 units capsule Take 1 capsule (2,000 Units total) by mouth daily. 100 capsule 3  . clotrimazole-betamethasone (LOTRISONE) cream Apply to affected lower vaginal area, 2 times daily, until healed 30 g 0  . conjugated estrogens (PREMARIN) vaginal cream 1/2 gram vaginally every night x 1 week, then twice weekly at bedtime 90 g 3  . cromolyn (OPTICROM) 4 % ophthalmic solution 1 DROP IN BOTH EYES FOUR TIMES A DAY  12  . cyanocobalamin (,VITAMIN B-12,) 1000 MCG/ML injection ADMINISTER 1 CC SUBQ EVERY 4 WEEKS AS DIRECTED 10 mL 0  . donepezil (ARICEPT) 5 MG tablet Take 1 tablet (5 mg total) by mouth at bedtime. 30 tablet 5  . Erythromycin 2 % ointment Apply to affected area 2 times daily 25 g 0  . fluticasone (FLONASE) 50 MCG/ACT nasal spray Place 2 sprays into both nostrils daily. 48 g 0  . folic acid (FOLVITE) 1 MG tablet Take 1 mg by mouth daily. For restless legs    . gabapentin (NEURONTIN) 300 MG capsule TAKE 1 CAPSULE (300 MG TOTAL) BY MOUTH DAILY. 90 capsule 1  . GINSENG PO Take 1 tablet by mouth daily.    Marland Kitchen  hydrochlorothiazide (MICROZIDE) 12.5 MG capsule Take 1 capsule (12.5 mg total) by mouth daily. For blood pressure 90 capsule 1  . Insulin Syringe-Needle U-100 (BD INSULIN SYRINGE U/F) 30G X 1/2" 0.5 ML MISC USE TO ADMINISTER B12 INJECTIONS    . KLOR-CON M10 10 MEQ tablet TAKE 1 TABLET (10 MEQ TOTAL) BY MOUTH DAILY. 90 tablet 1  . latanoprost (XALATAN) 0.005 % ophthalmic solution Place 1 drop into both eyes at bedtime. 6 mL 1  . latanoprost (XALATAN) 0.005 % ophthalmic solution PLACE 1 DROP INTO BOTH EYES AT  BEDTIME. 2 mL 2  . loratadine (CLARITIN) 10 MG tablet TAKE 1 TABLET (10 MG TOTAL) BY MOUTH DAILY. 90 tablet 0  . MAGNESIUM CHLORIDE PO Take 1 tablet by mouth daily.    . Multiple Vitamins-Minerals (CENTRUM SILVER 50+WOMEN) TABS Take 1 tablet by mouth daily.     . RABEprazole (ACIPHEX) 20 MG tablet Take 1 tablet (20 mg total) by mouth 2 (two) times daily. 60 tablet 11  . rosuvastatin (CRESTOR) 5 MG tablet Take 1 tablet (5 mg total) by mouth daily. 90 tablet 3  . sucralfate (CARAFATE) 1 g tablet Take 1 tablet (1 g total) by mouth 4 (four) times daily -  with meals and at bedtime. 120 tablet 5  . Dexlansoprazole (DEXILANT) 30 MG capsule Take 1 capsule (30 mg total) by mouth daily. 90 capsule 3  . nitrofurantoin, macrocrystal-monohydrate, (MACROBID) 100 MG capsule Take 1 capsule (100 mg total) by mouth 2 (two) times daily. 14 capsule 0   No current facility-administered medications for this visit.     Allergies: Pneumococcal vaccines; Clarithromycin; Hctz [hydrochlorothiazide]; Oxycodone-aspirin; and Propoxyphene n-acetaminophen  Past Medical History:  Diagnosis Date  . Allergy    rhinitis  . Chronic ethmoidal sinusitis   . Diverticulosis   . Diverticulosis of colon   . GERD (gastroesophageal reflux disease)   . Glaucoma   . Hx of colonic polyps   . Hyperlipidemia   . Hypertension   . Hypogammaglobulinemia ([place])    Monthly IVIG  . Leukemia ([place])    Chronic lymphocytic leukemia  . Osteoarthritis   . Peripheral neuropathy   . Pernicious anemia   . UTI (lower urinary tract infection) 2010  . Vitamin B12 deficiency     Past Surgical History:  Procedure Laterality Date  . ABDOMINAL HYSTERECTOMY  1987  . bilateral cataracts  2007  . bladder tack  1987  . L4-L5 Laminetomy  1999  . TUBAL LIGATION    . tubaligation  1961  . ureter reconstruction  1992    Family History  Problem Relation Age of Onset  . Jaundice Father   . Stroke Son 75       in NH  . Cancer Mother        uterine   . Diabetes Mother   . Other Sister        brain tumor  . Cancer Sister   . Hypertension Other   . Cancer Other        breast  . Thyroid disease Sister   . Cancer Sister        thyroid cancer    Social History   Tobacco Use  . Smoking status: Never Smoker  . Smokeless tobacco: Never Used  Substance Use Topics  . Alcohol use: No    Alcohol/week: 0.0 oz    Subjective:  Accompanied by her son with concerns for possible UTI; started last Thursday with increased urinary frequency/ burning on urination; denies  any fever; is prone to UTIs; has been treated for UTI within the past month; notes that she did have extreme amount of diarrhea in the days prior to the onset of the symptoms.   Objective:  Vitals:   10/08/17 1304  BP: 112/60  Pulse: 71  Temp: 98.1 F (36.7 C)  TempSrc: Oral  SpO2: 95%  Weight: 133 lb 1.3 oz (60.4 kg)  Height: 5\' 3"  (1.6 m)    General: Well developed, well nourished, in no acute distress  Skin : Warm and dry.  Head: Normocephalic and atraumatic  Lungs: Respirations unlabored; clear to auscultation bilaterally without wheeze, rales, rhonchi  CVS exam: normal rate and regular rhythm.  Neurologic: Alert and oriented; speech intact; face symmetrical; moves all extremities well; CNII-XII intact without focal deficit   Assessment:  1. Dysuria     Plan:  Suspect UTI; check U/A and urine culture today; Rx for Macrobid 100 mg bid x7 days; increase fluids, rest and follow-up worse, no better.   No Follow-up on file.  Orders Placed This Encounter  Procedures  . Urine Culture    Standing Status:   Future    Standing Expiration Date:   10/08/2018    Requested Prescriptions   Signed Prescriptions Disp Refills  . nitrofurantoin, macrocrystal-monohydrate, (MACROBID) 100 MG capsule 14 capsule 0    Sig: Take 1 capsule (100 mg total) by mouth 2 (two) times daily.

## 2017-10-08 NOTE — Telephone Encounter (Signed)
Notified pt w/MD response.../lmb 

## 2017-10-08 NOTE — Addendum Note (Signed)
Addended by: Marcina Millard on: 10/08/2017 03:02 PM   Modules accepted: Orders

## 2017-10-09 ENCOUNTER — Other Ambulatory Visit: Payer: Self-pay | Admitting: Nurse Practitioner

## 2017-10-09 DIAGNOSIS — C9110 Chronic lymphocytic leukemia of B-cell type not having achieved remission: Secondary | ICD-10-CM

## 2017-10-09 DIAGNOSIS — N63 Unspecified lump in unspecified breast: Secondary | ICD-10-CM

## 2017-10-09 LAB — URINE CULTURE
MICRO NUMBER: 90338560
SPECIMEN QUALITY:: ADEQUATE

## 2017-10-10 ENCOUNTER — Telehealth: Payer: Self-pay | Admitting: Internal Medicine

## 2017-10-10 NOTE — Telephone Encounter (Signed)
Copied from [place] [id number]. Topic: Inquiry >> Oct 10, 2017  3:25 PM Conception Chancy, NT wrote: Patient is calling to get results from 10/08/17. Please advise.

## 2017-10-11 ENCOUNTER — Ambulatory Visit
Admission: RE | Admit: 2017-10-11 | Discharge: 2017-10-11 | Disposition: A | Discharge status: Home / Self Care | Payer: Medicare Other | Source: Ambulatory Visit | Attending: Nurse Practitioner | Admitting: Nurse Practitioner

## 2017-10-11 ENCOUNTER — Other Ambulatory Visit: Payer: Self-pay | Admitting: Internal Medicine

## 2017-10-11 ENCOUNTER — Telehealth: Payer: Self-pay | Admitting: Obstetrics and Gynecology

## 2017-10-11 DIAGNOSIS — N63 Unspecified lump in unspecified breast: Secondary | ICD-10-CM

## 2017-10-11 DIAGNOSIS — C9110 Chronic lymphocytic leukemia of B-cell type not having achieved remission: Secondary | ICD-10-CM

## 2017-10-11 NOTE — Telephone Encounter (Signed)
See lab results.  

## 2017-10-11 NOTE — Telephone Encounter (Signed)
Patient thinks she may have a bladder infection. Th triage to assist with scheduling. Patient is unavailabe for an appointment today.

## 2017-10-11 NOTE — Telephone Encounter (Signed)
Spoke with patient. Patient states that she is having urinary frequency and pressure. Denies any pain with urination, fever, or chills. Had urine checked with PCP on 3/18 which showed single organism less than 10,000. Patient was started on Macrobid 100 mg bid x 7 days on 3/18. States she is still having symptoms and requests to be seen by our office. Appointment scheduled for tomorrow 3/22 at 9:15 am with Dr.Miller as Dr.Jertson will be out of the office. Patient is agreeable. Declines appointment for today due to other appointments.  Routing to provider for final review. Patient agreeable to disposition. Will close encounter.

## 2017-10-12 ENCOUNTER — Ambulatory Visit (INDEPENDENT_AMBULATORY_CARE_PROVIDER_SITE_OTHER): Payer: Medicare Other | Admitting: Obstetrics & Gynecology

## 2017-10-12 ENCOUNTER — Encounter: Payer: Self-pay | Admitting: Obstetrics & Gynecology

## 2017-10-12 VITALS — BP 118/80 | HR 80 | Temp 97.5°F | Resp 20 | Ht 62.5 in | Wt 133.0 lb

## 2017-10-12 DIAGNOSIS — N390 Urinary tract infection, site not specified: Secondary | ICD-10-CM

## 2017-10-12 DIAGNOSIS — R3915 Urgency of urination: Secondary | ICD-10-CM | POA: Diagnosis not present

## 2017-10-12 DIAGNOSIS — N952 Postmenopausal atrophic vaginitis: Secondary | ICD-10-CM | POA: Diagnosis not present

## 2017-10-12 LAB — POCT URINALYSIS DIPSTICK
BILIRUBIN UA: NEGATIVE
Glucose, UA: NEGATIVE
KETONES UA: NEGATIVE
Leukocytes, UA: NEGATIVE
NITRITE UA: POSITIVE
PH UA: 5 (ref 5.0–8.0)
Protein, UA: NEGATIVE
RBC UA: NEGATIVE
UROBILINOGEN UA: NEGATIVE U/dL — AB

## 2017-10-12 MED ORDER — SULFAMETHOXAZOLE-TRIMETHOPRIM 800-160 MG PO TABS: 1.0000 | ORAL_TABLET | Freq: Two times a day (BID) | ORAL | 0 refills | Status: DC

## 2017-10-12 MED ORDER — NITROFURANTOIN MONOHYD MACRO 100 MG PO CAPS: 100.0000 mg | ORAL_CAPSULE | Freq: Two times a day (BID) | ORAL | 0 refills | Status: DC

## 2017-10-12 NOTE — Patient Instructions (Signed)
Please start the new antibiotic--bactrim twice daily  Also, use a small amount of topical estrogen cream twice daily.  Just apply to the urethra as directed.

## 2017-10-12 NOTE — Progress Notes (Signed)
GYNECOLOGY  VISIT  CC:   Urinary urgency and pressure  HPI: 84 y.o. G28P4 Married Caucasian female here for urinary frequency & pressure that she reports has been present off and on since December.  Does not have dysuria.  Has not seen any vaginal bleeding or hematuria.  Has chronic low back pain but no new back pain symptoms.  Also frustrated with fatigue she is experiencing.  Would like to be outside and walk but just doesn't feel she has the energy to do this.  Was seen on Monday, 10/08/17 by Jodi Mourning.  Started on macrobid.  Urine culture negative.  Pt reports symptoms seem the same.  Follow up was recommended for next Monday but this has not been scheduled.  She decided to come her for another opinion.  At times, feels there is pressure like something has "fallen".  Does not actually feel a prolapse but wonders.  GYNECOLOGIC HISTORY: No LMP recorded. Patient is postmenopausal. Contraception: hysterectomy Menopausal hormone therapy: premarin vaginal cream  Patient Active Problem List   Diagnosis Date Noted  . Diarrhea 09/19/2017  . Constipation 09/12/2017  . Memory loss 07/25/2017  . Neck pain on left side 07/25/2017  . Urinary tract infection, recurrent 06/12/2017  . Thrush 02/10/2017  . Frequent urination 02/10/2017  . Osteoporosis 11/23/2016  . Abdominal pain 08/16/2016  . Urinary frequency 08/16/2016  . Peripheral edema 08/16/2016  . General weakness 08/16/2016  . Gait disorder 08/16/2016  . Dysuria 08/03/2016  . Syncope and collapse 05/10/2016  . Closed displaced simple supracondylar fracture of right humerus without intercondylar fracture 05/10/2016  . Urinary tract infection without hematuria 05/10/2016  . Syncope 05/10/2016  . Humeral head fracture, right, closed, initial encounter   . Burning tongue 03/25/2016  . Post concussion syndrome 03/03/2016  . Fall 03/03/2016  . Cystocele 03/05/2015  . Dehydration 02/08/2015  . Hyponatremia 02/08/2015  . Well adult exam  10/01/2014  . Grief at loss of child 12/11/2013  . Irregular heart beat 09/29/2013  . Humeral head fracture 08/26/2013  . Actinic keratoses 08/26/2013  . Carotid bruit 05/02/2012  . Anxiety 03/17/2011  . ANEMIA, PERNICIOUS, HX OF 10/10/2007  . Peripheral neuropathy ([place]) 09/04/2007  . Chronic fatigue 08/06/2007  . Chronic lymphocytic leukemia ([place]) 06/17/2007  . Allergic rhinitis 06/17/2007  . OSTEOARTHRITIS 06/17/2007  . B12 deficiency 06/07/2007  . GERD (gastroesophageal reflux disease) 04/30/2007  . Dyslipidemia 12/19/2006  . Essential hypertension 12/19/2006  . DIVERTICULOSIS, COLON 05/19/2003  . COLONIC POLYPS, HX OF 01/10/2000    Past Medical History:  Diagnosis Date  . Allergy    rhinitis  . Chronic ethmoidal sinusitis   . Diverticulosis   . Diverticulosis of colon   . GERD (gastroesophageal reflux disease)   . Glaucoma   . Hx of colonic polyps   . Hyperlipidemia   . Hypertension   . Hypogammaglobulinemia ([place])    Monthly IVIG  . Leukemia ([place])    Chronic lymphocytic leukemia  . Osteoarthritis   . Peripheral neuropathy   . Pernicious anemia   . UTI (lower urinary tract infection) 2010  . Vitamin B12 deficiency     Past Surgical History:  Procedure Laterality Date  . ABDOMINAL HYSTERECTOMY  1987  . bilateral cataracts  2007  . bladder tack  1987  . L4-L5 Laminetomy  1999  . TUBAL LIGATION    . tubaligation  1961  . ureter reconstruction  1992    MEDS:   Current Outpatient Medications on File Prior to Visit  Medication Sig Dispense Refill  . acetaminophen (TYLENOL) 500 MG tablet Take 500 mg by mouth 2 (two) times daily.     Marland Kitchen amLODipine (NORVASC) 2.5 MG tablet Take 1 tablet (2.5 mg total) by mouth daily. 90 tablet 3  . aspirin 81 MG EC tablet Take 81 mg by mouth daily.      . B-D INS SYRINGE 0.5CC/30GX1/2" 30G X 1/2" 0.5 ML MISC USE TO ADMINISTER B12 INJECTIONS 10 each 1  . Bioflavonoid Products (GRAPE SEED PO) Take 1 tablet by mouth daily.    .  cephALEXin (KEFLEX) 500 MG capsule Take 1 capsule (500 mg total) by mouth 4 (four) times daily. 20 capsule 0  . Cholecalciferol (VITAMIN D3) 2000 units capsule Take 1 capsule (2,000 Units total) by mouth daily. 100 capsule 3  . clotrimazole-betamethasone (LOTRISONE) cream Apply to affected lower vaginal area, 2 times daily, until healed 30 g 0  . conjugated estrogens (PREMARIN) vaginal cream 1/2 gram vaginally every night x 1 week, then twice weekly at bedtime 90 g 3  . cromolyn (OPTICROM) 4 % ophthalmic solution 1 DROP IN BOTH EYES FOUR TIMES A DAY  12  . cyanocobalamin (,VITAMIN B-12,) 1000 MCG/ML injection ADMINISTER 1 CC SUBQ EVERY 4 WEEKS AS DIRECTED 10 mL 0  . Dexlansoprazole (DEXILANT) 30 MG capsule Take 1 capsule (30 mg total) by mouth daily. 90 capsule 3  . donepezil (ARICEPT) 5 MG tablet Take 1 tablet (5 mg total) by mouth at bedtime. 30 tablet 5  . Erythromycin 2 % ointment Apply to affected area 2 times daily 25 g 0  . fluticasone (FLONASE) 50 MCG/ACT nasal spray Place 2 sprays into both nostrils daily. 48 g 0  . folic acid (FOLVITE) 1 MG tablet Take 1 mg by mouth daily. For restless legs    . gabapentin (NEURONTIN) 300 MG capsule TAKE 1 CAPSULE (300 MG TOTAL) BY MOUTH DAILY. 90 capsule 1  . GINSENG PO Take 1 tablet by mouth daily.    . hydrochlorothiazide (MICROZIDE) 12.5 MG capsule Take 1 capsule (12.5 mg total) by mouth daily. For blood pressure 90 capsule 1  . Insulin Syringe-Needle U-100 (BD INSULIN SYRINGE U/F) 30G X 1/2" 0.5 ML MISC USE TO ADMINISTER B12 INJECTIONS    . KLOR-CON M10 10 MEQ tablet TAKE 1 TABLET (10 MEQ TOTAL) BY MOUTH DAILY. 90 tablet 1  . latanoprost (XALATAN) 0.005 % ophthalmic solution Place 1 drop into both eyes at bedtime. 6 mL 1  . latanoprost (XALATAN) 0.005 % ophthalmic solution PLACE 1 DROP INTO BOTH EYES AT BEDTIME. 2 mL 2  . loratadine (CLARITIN) 10 MG tablet TAKE 1 TABLET (10 MG TOTAL) BY MOUTH DAILY. 90 tablet 0  . MAGNESIUM CHLORIDE PO Take 1 tablet  by mouth daily.    . Multiple Vitamins-Minerals (CENTRUM SILVER 50+WOMEN) TABS Take 1 tablet by mouth daily.     . nitrofurantoin, macrocrystal-monohydrate, (MACROBID) 100 MG capsule Take 1 capsule (100 mg total) by mouth 2 (two) times daily. 14 capsule 0  . RABEprazole (ACIPHEX) 20 MG tablet Take 1 tablet (20 mg total) by mouth 2 (two) times daily. 60 tablet 11  . rosuvastatin (CRESTOR) 5 MG tablet Take 1 tablet (5 mg total) by mouth daily. 90 tablet 3  . sucralfate (CARAFATE) 1 g tablet Take 1 tablet (1 g total) by mouth 4 (four) times daily -  with meals and at bedtime. 120 tablet 5   No current facility-administered medications on file prior to visit.  ALLERGIES: Pneumococcal vaccines; Clarithromycin; Hctz [hydrochlorothiazide]; Oxycodone-aspirin; and Propoxyphene n-acetaminophen  Family History  Problem Relation Age of Onset  . Jaundice Father   . Stroke Son 12       in NH  . Cancer Mother        uterine  . Diabetes Mother   . Other Sister        brain tumor  . Cancer Sister   . Hypertension Other   . Cancer Other        breast  . Thyroid disease Sister   . Cancer Sister        thyroid cancer    SH:  Married, non smoker  Review of Systems  Constitutional: Positive for malaise/fatigue.  Genitourinary: Positive for urgency.    PHYSICAL EXAMINATION:    BP 118/80   Pulse 80   Temp (!) 97.5 F (36.4 C) (Oral)   Resp 20   Ht 5' 2.5" (1.588 m)   Wt 133 lb (60.3 kg)   BMI 23.94 kg/m     General appearance: alert, cooperative and appears stated age CV:  Regular rate and rhythm Lungs:  clear to auscultation, no wheezes, rales or rhonchi, symmetric air entry Abdomen: soft, non-tender; bowel sounds normal; no masses,  no organomegaly Flank:  No CVA tenderness  Pelvic: External genitalia:  no lesions              Urethra:  normal appearing urethra with no masses, tenderness or lesions              Bartholins and Skenes: normal                 Vagina: atrophic  appearnac of vaginal tissue with normal color and no discharge, no lesions, second degree cystocele and rectocele noted              Cervix: absent              Bimanual Exam:  Uterus:  uterus absent              Adnexa: no mass, fullness, tenderness  Chaperone was present for exam.  Assessment: Urinary urgency and pressure Second degree cystocele and rectocele--doubtful this is cause of symptoms Atrophic changes  Plan: Urine culture pending.  Will change antibiotics to bactrim DS BID. Will start estrogen cream topically twice daily to urethra

## 2017-10-13 LAB — URINE CULTURE: Organism ID, Bacteria: NO GROWTH

## 2017-10-15 ENCOUNTER — Telehealth: Payer: Self-pay | Admitting: *Deleted

## 2017-10-15 ENCOUNTER — Other Ambulatory Visit: Payer: Self-pay | Admitting: Family

## 2017-10-15 MED ORDER — FLUCONAZOLE 150 MG PO TABS: 150.0000 mg | ORAL_TABLET | Freq: Once | ORAL | 0 refills | Status: AC

## 2017-10-15 NOTE — Telephone Encounter (Signed)
Notes recorded by Burnice Logan, RN on 10/15/2017 at 11:43 AM EDT Spoke with patient, advised as seen below per Dr. Sabra Heck. Completed Bactrim DS on 10/14/17, started vaginal estrogen cream on 3/23. Patient reports no change in symptoms, pressure and urgency. Denies any new symptoms. Drinks "lots of fluids", voids q 2.5 hrs, "normal' amounts. Advised patient will review with Dr. Sabra Heck and return call with recommendations, patient verbalizes understanding.   See telephone encounter dated 10/15/17 to review with provider.   Dr. Sabra Heck -please review and advise.

## 2017-10-15 NOTE — Telephone Encounter (Signed)
-----   Message from Megan Salon, MD sent at 10/15/2017 10:29 AM EDT ----- Please let pt know her urine culture was negative.  Can you please get an update on how she is feeling.  She does not have a UTI at this time.  Thanks.

## 2017-10-23 ENCOUNTER — Ambulatory Visit: Payer: Medicare Other | Admitting: Internal Medicine

## 2017-10-23 ENCOUNTER — Encounter: Payer: Self-pay | Admitting: Internal Medicine

## 2017-10-23 ENCOUNTER — Ambulatory Visit (INDEPENDENT_AMBULATORY_CARE_PROVIDER_SITE_OTHER): Payer: Medicare Other | Admitting: Internal Medicine

## 2017-10-23 ENCOUNTER — Other Ambulatory Visit: Payer: Medicare Other

## 2017-10-23 VITALS — BP 82/60 | HR 60 | Temp 98.2°F | Ht 62.5 in | Wt 135.0 lb

## 2017-10-23 DIAGNOSIS — R300 Dysuria: Secondary | ICD-10-CM | POA: Diagnosis not present

## 2017-10-23 LAB — POCT URINALYSIS DIPSTICK
Bilirubin, UA: NEGATIVE
GLUCOSE UA: NEGATIVE
Ketones, UA: NEGATIVE
Nitrite, UA: POSITIVE
Protein, UA: 15
RBC UA: 10
SPEC GRAV UA: 1.02 (ref 1.010–1.025)
Urobilinogen, UA: 1 E.U./dL
pH, UA: 6 (ref 5.0–8.0)

## 2017-10-23 MED ORDER — CEPHALEXIN 500 MG PO CAPS: 500.0000 mg | ORAL_CAPSULE | Freq: Two times a day (BID) | ORAL | 0 refills | Status: DC

## 2017-10-23 NOTE — Patient Instructions (Addendum)
We will send in keflex to take 1 pill twice a day for 1 week.   We will call you back with the culture results.

## 2017-10-23 NOTE — Assessment & Plan Note (Signed)
Rx for keflex for UTI given leukocytes on U/A done in office. Sending for culture given 2 antibiotics in the last month for UTI. Previously without indication of infection or growth. She is taking estrace cream which may help long term prevent infection.

## 2017-10-23 NOTE — Progress Notes (Signed)
   Subjective:    Patient ID: [name], female    DOB: 05/21/1926, 84 y.o.   MRN: [id number]  HPI The patient is an 84 YO female coming in for dysuria. Recently treated for UTI with macrobid. She then called gynecology and they saw her and gave her bactrim as well as vaginal estrogen cream. She is still having symptoms after these two antibiotics with pressure and frequency. She denies fevers or chills. She denies nausea or vomiting. She denies diarrhea. Has chronic constipation which is unchanged. She feels tired all the time. Denies dizziness or lightheadedness.   Review of Systems  Constitutional: Positive for fatigue.  Respiratory: Negative.   Cardiovascular: Negative.   Gastrointestinal: Positive for abdominal pain. Negative for abdominal distention, constipation, diarrhea, nausea and vomiting.  Genitourinary: Positive for dysuria, frequency and urgency.  Musculoskeletal: Negative.   Skin: Negative.       Objective:   Physical Exam  Constitutional: She is oriented to person, place, and time. She appears well-developed and well-nourished.  HENT:  Head: Normocephalic and atraumatic.  Eyes: EOM are normal.  Neck: Normal range of motion.  Cardiovascular: Normal rate and regular rhythm.  Pulmonary/Chest: Effort normal and breath sounds normal. No respiratory distress. She has no wheezes. She has no rales.  Abdominal: Soft. Bowel sounds are normal. She exhibits no distension. There is tenderness. There is no rebound.  Mild suprapubic tenderness  Neurological: She is alert and oriented to person, place, and time. Coordination normal.  Skin: Skin is warm and dry.   Vitals:   10/23/17 1117  BP: (!) 82/60  Pulse: 60  Temp: 98.2 F (36.8 C)  TempSrc: Oral  SpO2: 92%  Weight: 135 lb (61.2 kg)  Height: 5' 2.5" (1.588 m)      Assessment & Plan:

## 2017-10-25 ENCOUNTER — Inpatient Hospital Stay: Payer: Medicare Other | Attending: Oncology

## 2017-10-25 ENCOUNTER — Ambulatory Visit: Payer: Self-pay

## 2017-10-25 VITALS — BP 146/68 | HR 54 | Temp 97.8°F | Resp 17

## 2017-10-25 DIAGNOSIS — Z79899 Other long term (current) drug therapy: Secondary | ICD-10-CM | POA: Diagnosis not present

## 2017-10-25 DIAGNOSIS — C9110 Chronic lymphocytic leukemia of B-cell type not having achieved remission: Secondary | ICD-10-CM

## 2017-10-25 DIAGNOSIS — D801 Nonfamilial hypogammaglobulinemia: Secondary | ICD-10-CM | POA: Insufficient documentation

## 2017-10-25 LAB — URINE CULTURE
MICRO NUMBER:: 90406076
SPECIMEN QUALITY: ADEQUATE

## 2017-10-25 MED ORDER — DIPHENHYDRAMINE HCL 25 MG PO CAPS
25.0000 mg | ORAL_CAPSULE | Freq: Once | ORAL | Status: AC
  Administered 2017-10-25: 25 mg via ORAL

## 2017-10-25 MED ORDER — DIPHENHYDRAMINE HCL 25 MG PO CAPS
ORAL_CAPSULE | ORAL | Status: AC
  Filled 2017-10-25: qty 1

## 2017-10-25 MED ORDER — IMMUNE GLOBULIN (HUMAN) 10 GM/100ML IV SOLN
30.0000 g | Freq: Once | INTRAVENOUS | Status: AC
  Administered 2017-10-25: 30 g via INTRAVENOUS
  Filled 2017-10-25: qty 100

## 2017-10-25 MED ORDER — NITROFURANTOIN MONOHYD MACRO 100 MG PO CAPS: 100.0000 mg | ORAL_CAPSULE | Freq: Two times a day (BID) | ORAL | 0 refills | Status: DC

## 2017-10-25 NOTE — Patient Instructions (Signed)
[facility] Discharge Instructions for Patients Receiving Chemotherapy  Today you received the following chemotherapy agents [facility]igen.   To help prevent nausea and vomiting after your treatment, we encourage you to take your nausea medication as directed.   If you develop nausea and vomiting that is not controlled by your nausea medication, call the clinic.   BELOW ARE SYMPTOMS THAT SHOULD BE REPORTED IMMEDIATELY:  *FEVER GREATER THAN 100.5 F  *CHILLS WITH OR WITHOUT FEVER  NAUSEA AND VOMITING THAT IS NOT CONTROLLED WITH YOUR NAUSEA MEDICATION  *UNUSUAL SHORTNESS OF BREATH  *UNUSUAL BRUISING OR BLEEDING  TENDERNESS IN MOUTH AND THROAT WITH OR WITHOUT PRESENCE OF ULCERS  *URINARY PROBLEMS  *BOWEL PROBLEMS  UNUSUAL RASH Items with * indicate a potential emergency and should be followed up as soon as possible.  Feel free to call the clinic should you have any questions or concerns. The clinic phone number is [phone].  Please show the [facility] at check-in to the Emergency Department and triage nurse.

## 2017-10-25 NOTE — Telephone Encounter (Signed)
Patient called to say "I have been taking this antibiotic for my UTI and I am having diarrhea. It is getting worse. I messed up my clothes today at the cancer center and last night at home.  I also want her to know that the medication is not helping my UTI. I still have the same symptoms, pain with urination. Is there something else to do? Did my culture come back yet?" I advised I would send this to Dr. Sharlet Salina and someone will call with her recommendation, she verbalized understanding.   Reason for Disposition . [1] Taking antibiotic < 72 hours (3 days) for UTI AND [2] painful urination or frequency not improved . Caller has NON-URGENT medication question about med that PCP prescribed and triager unable to answer question  Answer Assessment - Initial Assessment Questions 1. ANTIBIOTIC: "What antibiotic are you taking?" "How many times per day?"     Keflex BID 2. DURATION: "When was the antibiotic started?"     10/23/17 3. MAIN SYMPTOM: "What is the main symptom you are concerned about?"     Diarrhea; getting worse 4. FEVER: "Do you have a fever?" If so, ask: "What is it, how was it measured, and when did it start?"     No 5. OTHER SYMPTOMS: "Do you have any other symptoms?" (e.g., flank pain, vaginal discharge, blood in urine)     Still having pain with urination, medicine not working  Answer Assessment - Initial Assessment Questions 1. SYMPTOMS: "Do you have any symptoms?"     Diarrhea 2. SEVERITY: If symptoms are present, ask "Are they mild, moderate or severe?"    Moderate to severe  Protocols used: URINARY TRACT INFECTION ON ANTIBIOTIC FOLLOW-UP CALL - FEMALE-A-AH, MEDICATION QUESTION CALL-A-AH

## 2017-10-25 NOTE — Telephone Encounter (Signed)
Please stop the Keflex; the culture did come back and we need to change her antibiotic again; I am going to send in another round of Macrobid for her; if she continues to have symptoms with this medication, would recommend having her see urology.

## 2017-10-25 NOTE — Telephone Encounter (Signed)
Please advise per Dr. Crawford's absence. Thank you  

## 2017-10-26 ENCOUNTER — Telehealth: Payer: Self-pay | Admitting: Internal Medicine

## 2017-10-26 NOTE — Telephone Encounter (Signed)
Copied from [place]. Topic: Quick Communication - See Telephone Encounter >> Oct 26, 2017  2:48 PM Oliver Pila B wrote: CRM for notification. See Telephone encounter for: 10/25/17. Nurse Triage  Medication needs to be filled: nitrofurantoin, macrocrystal-monohydrate, (MACROBID) 100 MG capsule [[id number]]

## 2017-10-26 NOTE — Telephone Encounter (Signed)
Please let pt know that the estrogen will take several weeks to notice improvement.  Just keep using it and let us know if not improved after two months.

## 2017-10-26 NOTE — Telephone Encounter (Signed)
Spoke with and gave recommendations per Dr. Sabra Heck. Advised that Premarin cream is to be used twice weekly - patient voiced understanding -eh

## 2017-10-26 NOTE — Telephone Encounter (Signed)
Patient called to inform the macrobid was sent to the pharmacy on 10/25/17, she says "I called and they said they didn't have it. Can you call them?" I placed the patient on hold and called the pharmacy and did not speak to them because it was noted as a phone in prescription. I called the [facility] who says she just called the medication in to the pharmacy and the patient's son is there now to get it. I advised the patient of the above.

## 2017-10-26 NOTE — Telephone Encounter (Signed)
Patient informed of response and stated understanding  

## 2017-11-01 ENCOUNTER — Telehealth: Payer: Self-pay | Admitting: Internal Medicine

## 2017-11-01 DIAGNOSIS — N390 Urinary tract infection, site not specified: Secondary | ICD-10-CM

## 2017-11-01 NOTE — Telephone Encounter (Signed)
Copied from [place] [phone]. Topic: Quick Communication - See Telephone Encounter >> Nov 01, 2017 12:55 PM Vernona Rieger wrote: CRM for notification. See Telephone encounter for: 11/01/17.  Patient said she took a test at the office 3-4 weeks ago for a bladder infection. She has one pill left & is still having symptoms of this. She said she took a test and it says that she has a bladder infection. She would not let me make her an appt. She wants the nurse to call her back, please. [phone]

## 2017-11-01 NOTE — Telephone Encounter (Signed)
Referral to Urologist made per lauras recommendation on last TE, pt notified

## 2017-11-02 ENCOUNTER — Other Ambulatory Visit: Payer: Self-pay | Admitting: Internal Medicine

## 2017-11-02 ENCOUNTER — Ambulatory Visit (INDEPENDENT_AMBULATORY_CARE_PROVIDER_SITE_OTHER): Payer: Medicare Other | Admitting: Family

## 2017-11-02 ENCOUNTER — Other Ambulatory Visit: Payer: Medicare Other

## 2017-11-02 ENCOUNTER — Encounter: Payer: Self-pay | Admitting: Family

## 2017-11-02 VITALS — BP 102/60 | HR 56 | Temp 97.5°F | Ht 62.5 in | Wt 137.1 lb

## 2017-11-02 DIAGNOSIS — M818 Other osteoporosis without current pathological fracture: Secondary | ICD-10-CM

## 2017-11-02 DIAGNOSIS — R300 Dysuria: Secondary | ICD-10-CM | POA: Diagnosis not present

## 2017-11-02 LAB — POC URINALSYSI DIPSTICK (AUTOMATED)
BILIRUBIN UA: NEGATIVE
Clarity, UA: NEGATIVE
Color, UA: NEGATIVE
GLUCOSE UA: NEGATIVE
Ketones, UA: NEGATIVE
LEUKOCYTES UA: NEGATIVE
NITRITE UA: NEGATIVE
PH UA: 6 (ref 5.0–8.0)
Protein, UA: NEGATIVE
RBC UA: NEGATIVE
Spec Grav, UA: 1.015 (ref 1.010–1.025)
UROBILINOGEN UA: 0.2 U/dL

## 2017-11-02 MED ORDER — DENOSUMAB 60 MG/ML ~~LOC~~ SOLN
60.0000 mg | Freq: Once | SUBCUTANEOUS | Status: AC
  Administered 2017-11-02: 60 mg via SUBCUTANEOUS

## 2017-11-02 MED ORDER — AMPICILLIN 250 MG PO CAPS: 250.0000 mg | ORAL_CAPSULE | Freq: Three times a day (TID) | ORAL | 0 refills | Status: DC

## 2017-11-02 NOTE — Patient Instructions (Signed)
Please start using your vaginal estrogen cream- this should help with the burning as well; Use it nightly x 1 week and then twice a week as discussed; Dr. Alain Marion has already referred you to see a urologist.

## 2017-11-02 NOTE — Progress Notes (Signed)
[name] is a 84 y.o. female with the following history as recorded in [employer]:  Patient Active Problem List   Diagnosis Date Noted  . Diarrhea 09/19/2017  . Constipation 09/12/2017  . Memory loss 07/25/2017  . Neck pain on left side 07/25/2017  . Urinary tract infection, recurrent 06/12/2017  . Thrush 02/10/2017  . Frequent urination 02/10/2017  . Osteoporosis 11/23/2016  . Abdominal pain 08/16/2016  . Urinary frequency 08/16/2016  . Peripheral edema 08/16/2016  . General weakness 08/16/2016  . Gait disorder 08/16/2016  . Dysuria 08/03/2016  . Syncope and collapse 05/10/2016  . Closed displaced simple supracondylar fracture of right humerus without intercondylar fracture 05/10/2016  . Urinary tract infection without hematuria 05/10/2016  . Syncope 05/10/2016  . Humeral head fracture, right, closed, initial encounter   . Burning tongue 03/25/2016  . Post concussion syndrome 03/03/2016  . Fall 03/03/2016  . Cystocele 03/05/2015  . Dehydration 02/08/2015  . Hyponatremia 02/08/2015  . Well adult exam 10/01/2014  . Grief at loss of child 12/11/2013  . Irregular heart beat 09/29/2013  . Humeral head fracture 08/26/2013  . Actinic keratoses 08/26/2013  . Carotid bruit 05/02/2012  . Anxiety 03/17/2011  . ANEMIA, PERNICIOUS, HX OF 10/10/2007  . Peripheral neuropathy ([place]) 09/04/2007  . Chronic fatigue 08/06/2007  . Chronic lymphocytic leukemia ([place]) 06/17/2007  . Allergic rhinitis 06/17/2007  . OSTEOARTHRITIS 06/17/2007  . B12 deficiency 06/07/2007  . GERD (gastroesophageal reflux disease) 04/30/2007  . Dyslipidemia 12/19/2006  . Essential hypertension 12/19/2006  . DIVERTICULOSIS, COLON 05/19/2003  . COLONIC POLYPS, HX OF 01/10/2000    Current Outpatient Medications  Medication Sig Dispense Refill  . acetaminophen (TYLENOL) 500 MG tablet Take 500 mg by mouth 2 (two) times daily.     Marland Kitchen amLODipine (NORVASC) 2.5 MG tablet Take 1 tablet (2.5 mg total) by mouth daily.  90 tablet 3  . aspirin 81 MG EC tablet Take 81 mg by mouth daily.      . B-D INS SYRINGE 0.5CC/30GX1/2" 30G X 1/2" 0.5 ML MISC USE TO ADMINISTER B12 INJECTIONS 10 each 1  . Bioflavonoid Products (GRAPE SEED PO) Take 1 tablet by mouth daily.    . Cholecalciferol (VITAMIN D3) 2000 units capsule Take 1 capsule (2,000 Units total) by mouth daily. 100 capsule 3  . conjugated estrogens (PREMARIN) vaginal cream 1/2 gram vaginally every night x 1 week, then twice weekly at bedtime 90 g 3  . cromolyn (OPTICROM) 4 % ophthalmic solution 1 DROP IN BOTH EYES FOUR TIMES A DAY  12  . cyanocobalamin (,VITAMIN B-12,) 1000 MCG/ML injection ADMINISTER 1 CC SUBQ EVERY 4 WEEKS AS DIRECTED 10 mL 0  . donepezil (ARICEPT) 5 MG tablet Take 1 tablet (5 mg total) by mouth at bedtime. 30 tablet 5  . fluticasone (FLONASE) 50 MCG/ACT nasal spray Place 2 sprays into both nostrils daily. 48 g 0  . folic acid (FOLVITE) 1 MG tablet Take 1 mg by mouth daily. For restless legs    . gabapentin (NEURONTIN) 300 MG capsule TAKE 1 CAPSULE (300 MG TOTAL) BY MOUTH DAILY. 90 capsule 1  . GINSENG PO Take 1 tablet by mouth daily.    . hydrochlorothiazide (MICROZIDE) 12.5 MG capsule Take 1 capsule (12.5 mg total) by mouth daily. For blood pressure 90 capsule 1  . Insulin Syringe-Needle U-100 (BD INSULIN SYRINGE U/F) 30G X 1/2" 0.5 ML MISC USE TO ADMINISTER B12 INJECTIONS    . KLOR-CON M10 10 MEQ tablet TAKE 1 TABLET (10 MEQ  TOTAL) BY MOUTH DAILY. 90 tablet 1  . latanoprost (XALATAN) 0.005 % ophthalmic solution PLACE 1 DROP INTO BOTH EYES AT BEDTIME. 2 mL 2  . loratadine (CLARITIN) 10 MG tablet TAKE 1 TABLET (10 MG TOTAL) BY MOUTH DAILY. 90 tablet 0  . MAGNESIUM CHLORIDE PO Take 1 tablet by mouth daily.    . Multiple Vitamins-Minerals (CENTRUM SILVER 50+WOMEN) TABS Take 1 tablet by mouth daily.     . RABEprazole (ACIPHEX) 20 MG tablet Take 1 tablet (20 mg total) by mouth 2 (two) times daily. 60 tablet 11  . rosuvastatin (CRESTOR) 5 MG tablet  Take 1 tablet (5 mg total) by mouth daily. 90 tablet 3  . sucralfate (CARAFATE) 1 g tablet Take 1 tablet (1 g total) by mouth 4 (four) times daily -  with meals and at bedtime. 120 tablet 5  . ampicillin (PRINCIPEN) 250 MG capsule Take 1 capsule (250 mg total) by mouth 3 (three) times daily. 21 capsule 0  . clotrimazole-betamethasone (LOTRISONE) cream Apply to affected lower vaginal area, 2 times daily, until healed (Patient not taking: Reported on 11/02/2017) 30 g 0  . Dexlansoprazole (DEXILANT) 30 MG capsule Take 1 capsule (30 mg total) by mouth daily. 90 capsule 3  . Erythromycin 2 % ointment Apply to affected area 2 times daily (Patient not taking: Reported on 11/02/2017) 25 g 0  . nitrofurantoin, macrocrystal-monohydrate, (MACROBID) 100 MG capsule Take 1 capsule (100 mg total) by mouth 2 (two) times daily. (Patient not taking: Reported on 11/02/2017) 14 capsule 0   No current facility-administered medications for this visit.     Allergies: Pneumococcal vaccines; Clarithromycin; Hctz [hydrochlorothiazide]; Oxycodone-aspirin; and Propoxyphene n-acetaminophen  Past Medical History:  Diagnosis Date  . Allergy    rhinitis  . Chronic ethmoidal sinusitis   . Diverticulosis   . Diverticulosis of colon   . GERD (gastroesophageal reflux disease)   . Glaucoma   . Hx of colonic polyps   . Hyperlipidemia   . Hypertension   . Hypogammaglobulinemia ([place])    Monthly IVIG  . Leukemia ([place])    Chronic lymphocytic leukemia  . Osteoarthritis   . Peripheral neuropathy   . Pernicious anemia   . UTI (lower urinary tract infection) 2010  . Vitamin B12 deficiency     Past Surgical History:  Procedure Laterality Date  . ABDOMINAL HYSTERECTOMY  1987  . bilateral cataracts  2007  . bladder tack  1987  . L4-L5 Laminetomy  1999  . TUBAL LIGATION    . tubaligation  1961  . ureter reconstruction  1992    Family History  Problem Relation Age of Onset  . Jaundice Father   . Stroke Son 59       in NH   . Cancer Mother        uterine  . Diabetes Mother   . Other Sister        brain tumor  . Cancer Sister   . Hypertension Other   . Cancer Other        breast  . Thyroid disease Sister   . Cancer Sister        thyroid cancer    Social History   Tobacco Use  . Smoking status: Never Smoker  . Smokeless tobacco: Never Used  Substance Use Topics  . Alcohol use: No    Alcohol/week: 0.0 oz    Subjective:  Patient was seen on 10/23/2017 with suspected UTI; was started on Keflex but then discontinued and changed to  Macrobid based on culture results; took Macrobid from 4/4-4/11; notes that she continued to feel the burning and discomfort the whole time she was on Macrobid; is wondering if there are other options for antibiotic treatment.  Does have a prescription for Premarin vaginal cream- admits she is not using it regularly; her PCP referred her to urology due to recurrent/ frequent infections. Denies any fever or blood in her urine;   Objective:  Vitals:   11/02/17 1411  BP: 102/60  Pulse: (!) 56  Temp: (!) 97.5 F (36.4 C)  TempSrc: Oral  SpO2: 95%  Weight: 137 lb 1.3 oz (62.2 kg)  Height: 5' 2.5" (1.588 m)    General: Well developed, well nourished, in no acute distress  Skin : Warm and dry.  Head: Normocephalic and atraumatic  Lungs: Respirations unlabored; clear to auscultation bilaterally without wheeze, rales, rhonchi  Vessels: Symmetric bilaterally  Neurologic: Alert and oriented; speech intact; face symmetrical; in wheelchair today; CNII-XII intact without focal deficit  Assessment:  1. Dysuria   2. Other osteoporosis without current pathological fracture     Plan:  1. Reviewed recent urine culture with patient; will try treating with Ampicillin; repeat urine culture today; discussed suspicion that lack of estrogen is also causing some of her symptoms- encouraged her to use her Premarin vaginal cream as prescribed; agree with referral to urology; follow-up to be  determined after culture obtained. 2. Prolia given today; patient understands she may have to pay for part of the medication and is comfortable with treatment plan;   No follow-ups on file.  Orders Placed This Encounter  Procedures  . Urine Culture    Standing Status:   Future    Standing Expiration Date:   11/02/2018  . POCT Urinalysis Dipstick (Automated)    Requested Prescriptions   Signed Prescriptions Disp Refills  . ampicillin (PRINCIPEN) 250 MG capsule 21 capsule 0    Sig: Take 1 capsule (250 mg total) by mouth 3 (three) times daily.

## 2017-11-03 ENCOUNTER — Other Ambulatory Visit: Payer: Self-pay | Admitting: Internal Medicine

## 2017-11-03 LAB — URINE CULTURE
MICRO NUMBER: 90453597
Result:: NO GROWTH
SPECIMEN QUALITY: ADEQUATE

## 2017-11-05 NOTE — Telephone Encounter (Signed)
pls advise if ok to refill../lmb 

## 2017-11-07 ENCOUNTER — Telehealth: Payer: Self-pay | Admitting: Internal Medicine

## 2017-11-07 ENCOUNTER — Other Ambulatory Visit: Payer: Self-pay | Admitting: Family

## 2017-11-07 DIAGNOSIS — K5900 Constipation, unspecified: Secondary | ICD-10-CM | POA: Diagnosis not present

## 2017-11-07 DIAGNOSIS — K5790 Diverticulosis of intestine, part unspecified, without perforation or abscess without bleeding: Secondary | ICD-10-CM

## 2017-11-07 DIAGNOSIS — M810 Age-related osteoporosis without current pathological fracture: Secondary | ICD-10-CM | POA: Diagnosis not present

## 2017-11-07 DIAGNOSIS — Z9181 History of falling: Secondary | ICD-10-CM

## 2017-11-07 DIAGNOSIS — G2581 Restless legs syndrome: Secondary | ICD-10-CM

## 2017-11-07 DIAGNOSIS — M6281 Muscle weakness (generalized): Secondary | ICD-10-CM

## 2017-11-07 DIAGNOSIS — G629 Polyneuropathy, unspecified: Secondary | ICD-10-CM

## 2017-11-07 DIAGNOSIS — R32 Unspecified urinary incontinence: Secondary | ICD-10-CM | POA: Diagnosis not present

## 2017-11-07 DIAGNOSIS — C9190 Lymphoid leukemia, unspecified not having achieved remission: Secondary | ICD-10-CM

## 2017-11-07 DIAGNOSIS — D519 Vitamin B12 deficiency anemia, unspecified: Secondary | ICD-10-CM | POA: Diagnosis not present

## 2017-11-07 DIAGNOSIS — Z87440 Personal history of urinary (tract) infections: Secondary | ICD-10-CM

## 2017-11-07 DIAGNOSIS — Z7982 Long term (current) use of aspirin: Secondary | ICD-10-CM

## 2017-11-07 DIAGNOSIS — E785 Hyperlipidemia, unspecified: Secondary | ICD-10-CM

## 2017-11-07 DIAGNOSIS — H409 Unspecified glaucoma: Secondary | ICD-10-CM

## 2017-11-07 DIAGNOSIS — E559 Vitamin D deficiency, unspecified: Secondary | ICD-10-CM | POA: Diagnosis not present

## 2017-11-07 DIAGNOSIS — I10 Essential (primary) hypertension: Secondary | ICD-10-CM | POA: Diagnosis not present

## 2017-11-07 DIAGNOSIS — K219 Gastro-esophageal reflux disease without esophagitis: Secondary | ICD-10-CM

## 2017-11-07 MED ORDER — ESTROGENS, CONJUGATED 0.625 MG/GM VA CREA: TOPICAL_CREAM | VAGINAL | 0 refills | Status: DC

## 2017-11-07 NOTE — Telephone Encounter (Signed)
She was given that prescription by her GYN; she told me she the cream and it just wasn't using it; I will send in for her.

## 2017-11-07 NOTE — Telephone Encounter (Signed)
Copied from [place] [id number]. Topic: Quick Communication - See Telephone Encounter >> Nov 07, 2017  3:22 PM Synthia Innocent wrote: CRM for notification. See Telephone encounter for: 11/07/17. Patient checking status of Premarin cream, states it was to be sent on 11/02/17, [facility] [address]

## 2017-11-07 NOTE — Telephone Encounter (Signed)
Pt saw you on 11/02/17, I see where you notated the cream. Did you want to send in?

## 2017-11-07 NOTE — Telephone Encounter (Signed)
Was not called in, can patient get?

## 2017-11-08 ENCOUNTER — Encounter: Payer: Self-pay | Admitting: Sports Medicine

## 2017-11-08 ENCOUNTER — Ambulatory Visit (INDEPENDENT_AMBULATORY_CARE_PROVIDER_SITE_OTHER): Payer: Medicare Other | Admitting: Sports Medicine

## 2017-11-08 DIAGNOSIS — B351 Tinea unguium: Secondary | ICD-10-CM | POA: Diagnosis not present

## 2017-11-08 DIAGNOSIS — I739 Peripheral vascular disease, unspecified: Secondary | ICD-10-CM | POA: Diagnosis not present

## 2017-11-08 DIAGNOSIS — M79674 Pain in right toe(s): Secondary | ICD-10-CM

## 2017-11-08 DIAGNOSIS — M79675 Pain in left toe(s): Secondary | ICD-10-CM | POA: Diagnosis not present

## 2017-11-08 DIAGNOSIS — R413 Other amnesia: Secondary | ICD-10-CM

## 2017-11-08 NOTE — Telephone Encounter (Signed)
Spoke with patient today and info given. 

## 2017-11-08 NOTE — Progress Notes (Signed)
Subjective: [name] is a 84 y.o. female patient seen today in office with complaint of painful thickened and elongated toenails; unable to trim. Patient denies any changes with medical history since last visit.  Patient states that her son is sitting in the waiting room who brought her here today.  Patient has no other pedal complaints at this time.    Patient Active Problem List   Diagnosis Date Noted  . Diarrhea 09/19/2017  . Constipation 09/12/2017  . Memory loss 07/25/2017  . Neck pain on left side 07/25/2017  . Urinary tract infection, recurrent 06/12/2017  . Thrush 02/10/2017  . Frequent urination 02/10/2017  . Osteoporosis 11/23/2016  . Abdominal pain 08/16/2016  . Urinary frequency 08/16/2016  . Peripheral edema 08/16/2016  . General weakness 08/16/2016  . Gait disorder 08/16/2016  . Dysuria 08/03/2016  . Syncope and collapse 05/10/2016  . Closed displaced simple supracondylar fracture of right humerus without intercondylar fracture 05/10/2016  . Urinary tract infection without hematuria 05/10/2016  . Syncope 05/10/2016  . Humeral head fracture, right, closed, initial encounter   . Burning tongue 03/25/2016  . Post concussion syndrome 03/03/2016  . Fall 03/03/2016  . Cystocele 03/05/2015  . Dehydration 02/08/2015  . Hyponatremia 02/08/2015  . Well adult exam 10/01/2014  . Grief at loss of child 12/11/2013  . Irregular heart beat 09/29/2013  . Humeral head fracture 08/26/2013  . Actinic keratoses 08/26/2013  . Carotid bruit 05/02/2012  . Anxiety 03/17/2011  . ANEMIA, PERNICIOUS, HX OF 10/10/2007  . Peripheral neuropathy ([place]) 09/04/2007  . Chronic fatigue 08/06/2007  . Chronic lymphocytic leukemia ([place]) 06/17/2007  . Allergic rhinitis 06/17/2007  . OSTEOARTHRITIS 06/17/2007  . B12 deficiency 06/07/2007  . GERD (gastroesophageal reflux disease) 04/30/2007  . Dyslipidemia 12/19/2006  . Essential hypertension 12/19/2006  . DIVERTICULOSIS, COLON 05/19/2003  .  COLONIC POLYPS, HX OF 01/10/2000    Current Outpatient Medications on File Prior to Visit  Medication Sig Dispense Refill  . acetaminophen (TYLENOL) 500 MG tablet Take 500 mg by mouth 2 (two) times daily.     Marland Kitchen amLODipine (NORVASC) 2.5 MG tablet Take 1 tablet (2.5 mg total) by mouth daily. 90 tablet 3  . ampicillin (PRINCIPEN) 250 MG capsule Take 1 capsule (250 mg total) by mouth 3 (three) times daily. 21 capsule 0  . aspirin 81 MG EC tablet Take 81 mg by mouth daily.      . B-D INS SYRINGE 0.5CC/30GX1/2" 30G X 1/2" 0.5 ML MISC USE TO ADMINISTER B12 INJECTIONS 10 each 1  . Bioflavonoid Products (GRAPE SEED PO) Take 1 tablet by mouth daily.    . Cholecalciferol (VITAMIN D3) 2000 units capsule Take 1 capsule (2,000 Units total) by mouth daily. 100 capsule 3  . clotrimazole-betamethasone (LOTRISONE) cream Apply to affected lower vaginal area, 2 times daily, until healed (Patient not taking: Reported on 11/02/2017) 30 g 0  . conjugated estrogens (PREMARIN) vaginal cream 1/2 gram vaginally every night x 1 week, then twice weekly at bedtime 90 g 0  . cromolyn (OPTICROM) 4 % ophthalmic solution 1 DROP IN BOTH EYES FOUR TIMES A DAY  12  . cyanocobalamin (,VITAMIN B-12,) 1000 MCG/ML injection ADMINISTER 1 CC SUBQ EVERY 4 WEEKS AS DIRECTED 10 mL 0  . Dexlansoprazole (DEXILANT) 30 MG capsule Take 1 capsule (30 mg total) by mouth daily. 90 capsule 3  . donepezil (ARICEPT) 5 MG tablet Take 1 tablet (5 mg total) by mouth at bedtime. 30 tablet 5  . Erythromycin 2 %  ointment Apply to affected area 2 times daily (Patient not taking: Reported on 11/02/2017) 25 g 0  . fluticasone (FLONASE) 50 MCG/ACT nasal spray Place 2 sprays into both nostrils daily. 48 g 0  . folic acid (FOLVITE) 1 MG tablet Take 1 mg by mouth daily. For restless legs    . gabapentin (NEURONTIN) 300 MG capsule TAKE 1 CAPSULE (300 MG TOTAL) BY MOUTH DAILY. 90 capsule 1  . GINSENG PO Take 1 tablet by mouth daily.    . hydrochlorothiazide  (MICROZIDE) 12.5 MG capsule Take 1 capsule (12.5 mg total) by mouth daily. For blood pressure 90 capsule 1  . Insulin Syringe-Needle U-100 (BD INSULIN SYRINGE U/F) 30G X 1/2" 0.5 ML MISC USE TO ADMINISTER B12 INJECTIONS    . KLOR-CON M10 10 MEQ tablet TAKE 1 TABLET (10 MEQ TOTAL) BY MOUTH DAILY. 90 tablet 1  . latanoprost (XALATAN) 0.005 % ophthalmic solution PLACE 1 DROP INTO BOTH EYES AT BEDTIME. 2 mL 2  . loratadine (CLARITIN) 10 MG tablet TAKE 1 TABLET (10 MG TOTAL) BY MOUTH DAILY. 90 tablet 0  . MAGNESIUM CHLORIDE PO Take 1 tablet by mouth daily.    . Multiple Vitamins-Minerals (CENTRUM SILVER 50+WOMEN) TABS Take 1 tablet by mouth daily.     . nitrofurantoin, macrocrystal-monohydrate, (MACROBID) 100 MG capsule Take 1 capsule (100 mg total) by mouth 2 (two) times daily. (Patient not taking: Reported on 11/02/2017) 14 capsule 0  . RABEprazole (ACIPHEX) 20 MG tablet Take 1 tablet (20 mg total) by mouth 2 (two) times daily. 60 tablet 11  . rosuvastatin (CRESTOR) 5 MG tablet Take 1 tablet (5 mg total) by mouth daily. 90 tablet 3  . sucralfate (CARAFATE) 1 g tablet Take 1 tablet (1 g total) by mouth 4 (four) times daily -  with meals and at bedtime. 120 tablet 5   No current facility-administered medications on file prior to visit.     Allergies  Allergen Reactions  . Pneumococcal Vaccines Swelling and Rash    Pneumococcal Vaccine-23 valent  . Clarithromycin Other (See Comments)    REACTION: sore mouth  . Hctz [Hydrochlorothiazide]     Low Na  . Oxycodone-Aspirin Other (See Comments)    REACTION: horrible nightmares  . Propoxyphene N-Acetaminophen Other (See Comments)    REACTION: couldn't wake her up    Objective: Physical Exam  General: Well developed, nourished, no acute distress, awake, alert and oriented x 3  Vascular: Dorsalis pedis artery 1/4 bilateral, Posterior tibial artery 1/4 bilateral, skin temperature warm to cool proximal to distal bilateral lower extremities, +  varicosities, pedal hair present bilateral.  Neurological: Gross sensation present via light touch bilateral.   Dermatological: Skin is warm, dry, and supple bilateral, Nails 1-10 are tender, long, thick, and discolored with mild subungal debris, no webspace macerations present bilateral, no open lesions present bilateral, no callus/corns/hyperkeratotic tissue present bilateral. No signs of infection bilateral.  Musculoskeletal: Asymptomatic pes planus and hammertoe boney deformities noted bilateral. Muscular strength within normal limits without painon range of motion. No pain with calf compression bilateral.  Assessment and Plan:  Problem List Items Addressed This Visit      Other   Memory loss    Other Visit Diagnoses    Pain due to onychomycosis of toenails of both feet    -  Primary   PVD (peripheral vascular disease) ([place])         -Examined patient.  -Discussed treatment options for painful mycotic nails. -Mechanically debrided and reduced mycotic  nails with sterile nail nipper and dremel nail file without incident. -Continue with rolling walker for stability in gait -Patient to return in 3 months for follow up evaluation or sooner if symptoms worsen.  Landis Martins, DPM

## 2017-11-09 ENCOUNTER — Ambulatory Visit: Payer: Self-pay | Admitting: *Deleted

## 2017-11-09 NOTE — Telephone Encounter (Signed)
Pt   Reports symptoms  Of  Urinary discomfort    frequency  And   Sensation of not  Emptying completely.No  Availability  With [employer].Appointment  Made  For tomorrow  With Dr Nyoka Cowden at [facility]    Reason for Disposition . Urinating more frequently than usual (i.e., frequency)  Answer Assessment - Initial Assessment Questions 1. SYMPTOM: "What's the main symptom you're concerned about?" (e.g., frequency, incontinence)   Discomfort   On  Urination   Feels  Like  Not  Emptying  Completely   Frequency  2. ONSET: "When did the  ________  start?"       2  Days  Ago   3. PAIN: "Is there any pain?" If so, ask: "How bad is it?" (Scale: 1-10; mild, moderate, severe)       Moderate   4. CAUSE: "What do you think is causing the symptoms?"       Possibly  A  uti   5. OTHER SYMPTOMS: "Do you have any other symptoms?" (e.g., fever, flank pain, blood in urine, pain with urination)      Pain  With  Urination   6. PREGNANCY: "Is there any chance you are pregnant?" "When was your last menstrual period?"     n/a  Protocols used: URINARY [facility]

## 2017-11-10 ENCOUNTER — Encounter: Payer: Self-pay | Admitting: Family Medicine

## 2017-11-10 ENCOUNTER — Ambulatory Visit (INDEPENDENT_AMBULATORY_CARE_PROVIDER_SITE_OTHER): Payer: Medicare Other | Admitting: Family Medicine

## 2017-11-10 ENCOUNTER — Other Ambulatory Visit: Payer: Self-pay

## 2017-11-10 VITALS — BP 130/70 | HR 89 | Temp 97.5°F | Ht 64.0 in | Wt 128.2 lb

## 2017-11-10 DIAGNOSIS — R102 Pelvic and perineal pain: Secondary | ICD-10-CM | POA: Diagnosis not present

## 2017-11-10 DIAGNOSIS — N23 Unspecified renal colic: Secondary | ICD-10-CM

## 2017-11-10 DIAGNOSIS — Z87440 Personal history of urinary (tract) infections: Secondary | ICD-10-CM

## 2017-11-10 DIAGNOSIS — N760 Acute vaginitis: Secondary | ICD-10-CM | POA: Diagnosis not present

## 2017-11-10 DIAGNOSIS — IMO0002 Reserved for concepts with insufficient information to code with codable children: Secondary | ICD-10-CM

## 2017-11-10 LAB — POCT URINALYSIS DIP (MANUAL ENTRY)
Bilirubin, UA: NEGATIVE
Blood, UA: NEGATIVE
Glucose, UA: NEGATIVE mg/dL
LEUKOCYTES UA: NEGATIVE
Nitrite, UA: NEGATIVE
PH UA: 8 (ref 5.0–8.0)
PROTEIN UA: NEGATIVE mg/dL
SPEC GRAV UA: 1.02 (ref 1.010–1.025)
UROBILINOGEN UA: 0.2 U/dL

## 2017-11-10 LAB — POCT WET + KOH PREP
TRICH BY WET PREP: ABSENT
YEAST BY KOH: ABSENT
Yeast by wet prep: ABSENT

## 2017-11-10 LAB — POC MICROSCOPIC URINALYSIS (UMFC): MUCUS RE: ABSENT

## 2017-11-10 MED ORDER — FLUCONAZOLE 150 MG PO TABS: 150.0000 mg | ORAL_TABLET | Freq: Once | ORAL | 1 refills | Status: DC

## 2017-11-10 MED ORDER — CEPHALEXIN 500 MG PO CAPS: 500.0000 mg | ORAL_CAPSULE | Freq: Two times a day (BID) | ORAL | 0 refills | Status: DC

## 2017-11-10 NOTE — Progress Notes (Signed)
Subjective:  By signing my name below, I, [name], attest that this documentation has been prepared under the direction and in the presence of Melissa Ray, MD. Electronically Signed: Moises Parker, [place]. 11/10/2017 , 2:28 PM .  Patient was seen in Room 13 .   Patient ID: [name], female    DOB: July 07, 1926, 84 y.o.   MRN: [id number] Chief Complaint  Patient presents with  . Painful urination    hurts in the girly area x3-4 days feels wea. has had bladder infections in the past. this one hurts the most so far   HPI [name] is a 84 y.o. female  Patient has a history of multiple medical problems and history of UTI. She was having generalized weakness when evaluated on Feb 27th with Dr. Quay Burow. She was sent to the ER, and had been on Keflex at that time, infection with aerococcus viridans from Feb 24th culture. She was treated in the ER for hydration, urinalysis in the ER was negative and negative Parker cultures at that time. It appears she's had multiple urine culture since that visit including March 18th which was negative for acute infection, March 22nd without growth, April 2nd positive for enterococcus faecalis treated with Macrobid, and April 12th without growth. At April 12th visit at [facility], she did note she had been referred to urology for recurrent infections. She had prescription of Premarin vaginal cream but not using it regularly. She initially started on ampicillin 250mg  TID and recommended to restart vaginal estrogen cream. Appears referral to urology was sent on April 11th, [facility] to contact patient.   Patient states she's been having persistent vaginal pain that started about 3 days ago. She describes location of pain to be more internal, and not external. She mentions having some unexpected weight loss from her last visit at [employer] [facility]. She denies genital rash or blisters, new vaginal discharge, abdominal pain, fever, nausea, vomiting or hematuria.  She hasn't received a call from urology yet.   Patient Active Problem List   Diagnosis Date Noted  . Diarrhea 09/19/2017  . Constipation 09/12/2017  . Memory loss 07/25/2017  . Neck pain on left side 07/25/2017  . Urinary tract infection, recurrent 06/12/2017  . Thrush 02/10/2017  . Frequent urination 02/10/2017  . Osteoporosis 11/23/2016  . Abdominal pain 08/16/2016  . Urinary frequency 08/16/2016  . Peripheral edema 08/16/2016  . General weakness 08/16/2016  . Gait disorder 08/16/2016  . Dysuria 08/03/2016  . Syncope and collapse 05/10/2016  . Closed displaced simple supracondylar fracture of right humerus without intercondylar fracture 05/10/2016  . Urinary tract infection without hematuria 05/10/2016  . Syncope 05/10/2016  . Humeral head fracture, right, closed, initial encounter   . Burning tongue 03/25/2016  . Post concussion syndrome 03/03/2016  . Fall 03/03/2016  . Cystocele 03/05/2015  . Dehydration 02/08/2015  . Hyponatremia 02/08/2015  . Well adult exam 10/01/2014  . Grief at loss of child 12/11/2013  . Irregular heart beat 09/29/2013  . Humeral head fracture 08/26/2013  . Actinic keratoses 08/26/2013  . Carotid bruit 05/02/2012  . Anxiety 03/17/2011  . ANEMIA, PERNICIOUS, HX OF 10/10/2007  . Peripheral neuropathy ([place]) 09/04/2007  . Chronic fatigue 08/06/2007  . Chronic lymphocytic leukemia ([place]) 06/17/2007  . Allergic rhinitis 06/17/2007  . OSTEOARTHRITIS 06/17/2007  . B12 deficiency 06/07/2007  . GERD (gastroesophageal reflux disease) 04/30/2007  . Dyslipidemia 12/19/2006  . Essential hypertension 12/19/2006  . DIVERTICULOSIS, COLON 05/19/2003  . COLONIC POLYPS, HX OF  01/10/2000   Past Medical History:  Diagnosis Date  . Allergy    rhinitis  . Chronic ethmoidal sinusitis   . Diverticulosis   . Diverticulosis of colon   . GERD (gastroesophageal reflux disease)   . Glaucoma   . Hx of colonic polyps   . Hyperlipidemia   . Hypertension   .  Hypogammaglobulinemia ([place])    Monthly IVIG  . Leukemia ([place])    Chronic lymphocytic leukemia  . Osteoarthritis   . Peripheral neuropathy   . Pernicious anemia   . UTI (lower urinary tract infection) 2010  . Vitamin B12 deficiency    Past Surgical History:  Procedure Laterality Date  . ABDOMINAL HYSTERECTOMY  1987  . bilateral cataracts  2007  . bladder tack  1987  . L4-L5 Laminetomy  1999  . TUBAL LIGATION    . tubaligation  1961  . ureter reconstruction  1992   Allergies  Allergen Reactions  . Pneumococcal Vaccines Swelling and Rash    Pneumococcal Vaccine-23 valent  . Clarithromycin Other (See Comments)    REACTION: sore mouth  . Hctz [Hydrochlorothiazide]     Low Na  . Oxycodone-Aspirin Other (See Comments)    REACTION: horrible nightmares  . Propoxyphene N-Acetaminophen Other (See Comments)    REACTION: couldn't wake her up   Prior to Admission medications   Medication Sig Start Date End Date Taking? Authorizing Provider  acetaminophen (TYLENOL) 500 MG tablet Take 500 mg by mouth 2 (two) times daily.     [provider]  amLODipine (NORVASC) 2.5 MG tablet Take 1 tablet (2.5 mg total) by mouth daily. 02/27/17   Plotnikov, Evie Lacks, MD  ampicillin (PRINCIPEN) 250 MG capsule Take 1 capsule (250 mg total) by mouth 3 (three) times daily. 11/02/17   Marrian Salvage, FNP  aspirin 81 MG EC tablet Take 81 mg by mouth daily.      [provider]  B-D INS SYRINGE 0.5CC/30GX1/2" 30G X 1/2" 0.5 ML MISC USE TO ADMINISTER B12 INJECTIONS 06/13/17   Plotnikov, Evie Lacks, MD  Bioflavonoid Products (GRAPE SEED PO) Take 1 tablet by mouth daily.    [provider]  Cholecalciferol (VITAMIN D3) 2000 units capsule Take 1 capsule (2,000 Units total) by mouth daily. 11/23/16   Plotnikov, Evie Lacks, MD  clotrimazole-betamethasone (LOTRISONE) cream Apply to affected lower vaginal area, 2 times daily, until healed Patient not taking: Reported on 11/02/2017 06/19/17    Daleen Bo, MD  conjugated estrogens (PREMARIN) vaginal cream 1/2 gram vaginally every night x 1 week, then twice weekly at bedtime 11/07/17   Marrian Salvage, FNP  cromolyn (OPTICROM) 4 % ophthalmic solution 1 DROP IN BOTH EYES FOUR TIMES A DAY 03/21/16   [provider]  cyanocobalamin (,VITAMIN B-12,) 1000 MCG/ML injection ADMINISTER 1 CC SUBQ EVERY 4 WEEKS AS DIRECTED 08/07/16   Plotnikov, Evie Lacks, MD  Dexlansoprazole (DEXILANT) 30 MG capsule Take 1 capsule (30 mg total) by mouth daily. 07/25/17 10/12/17  Plotnikov, Evie Lacks, MD  donepezil (ARICEPT) 5 MG tablet Take 1 tablet (5 mg total) by mouth at bedtime. 07/25/17   Plotnikov, Evie Lacks, MD  Erythromycin 2 % ointment Apply to affected area 2 times daily Patient not taking: Reported on 11/02/2017 05/12/17   Dorie Rank, MD  fluticasone [facility]) 50 MCG/ACT nasal spray Place 2 sprays into both nostrils daily. 11/21/16   Plotnikov, Evie Lacks, MD  folic acid (FOLVITE) 1 MG tablet Take 1 mg by mouth daily. For restless legs  [provider]  gabapentin (NEURONTIN) 300 MG capsule TAKE 1 CAPSULE (300 MG TOTAL) BY MOUTH DAILY. 09/06/17   Plotnikov, Evie Lacks, MD  GINSENG PO Take 1 tablet by mouth daily.    [provider]  hydrochlorothiazide (MICROZIDE) 12.5 MG capsule Take 1 capsule (12.5 mg total) by mouth daily. For Parker pressure 07/12/17   Plotnikov, Evie Lacks, MD  Insulin Syringe-Needle U-100 (BD INSULIN SYRINGE U/F) 30G X 1/2" 0.5 ML MISC USE TO ADMINISTER B12 INJECTIONS 06/13/17   [provider]  KLOR-CON M10 10 MEQ tablet TAKE 1 TABLET (10 MEQ TOTAL) BY MOUTH DAILY. 11/05/17   Plotnikov, Evie Lacks, MD  latanoprost (XALATAN) 0.005 % ophthalmic solution PLACE 1 DROP INTO BOTH EYES AT BEDTIME. 09/25/17   Plotnikov, Evie Lacks, MD  loratadine (CLARITIN) 10 MG tablet TAKE 1 TABLET (10 MG TOTAL) BY MOUTH DAILY. 08/23/17   Plotnikov, Evie Lacks, MD  MAGNESIUM CHLORIDE PO Take 1 tablet by mouth daily.    [provider]  Multiple Vitamins-Minerals (CENTRUM SILVER 50+WOMEN) TABS Take 1 tablet by mouth daily.     [provider]  nitrofurantoin, macrocrystal-monohydrate, (MACROBID) 100 MG capsule Take 1 capsule (100 mg total) by mouth 2 (two) times daily. Patient not taking: Reported on 11/02/2017 10/25/17   Marrian Salvage, FNP  RABEprazole (ACIPHEX) 20 MG tablet Take 1 tablet (20 mg total) by mouth 2 (two) times daily. 10/07/17   Plotnikov, Evie Lacks, MD  rosuvastatin (CRESTOR) 5 MG tablet Take 1 tablet (5 mg total) by mouth daily. 08/21/17   Plotnikov, Evie Lacks, MD  sucralfate (CARAFATE) 1 g tablet Take 1 tablet (1 g total) by mouth 4 (four) times daily -  with meals and at bedtime. 08/07/17   Plotnikov, Evie Lacks, MD   Social History   Socioeconomic History  . Marital status: Widowed    Spouse name: Not on file  . Number of children: Not on file  . Years of education: Not on file  . Highest education level: Not on file  Occupational History  . Not on file  Social Needs  . Financial resource strain: Not on file  . Food insecurity:    Worry: Not on file    Inability: Not on file  . Transportation needs:    Medical: Not on file    Non-medical: Not on file  Tobacco Use  . Smoking status: Never Smoker  . Smokeless tobacco: Never Used  Substance and Sexual Activity  . Alcohol use: No    Alcohol/week: 0.0 oz  . Drug use: No  . Sexual activity: Not Currently  Lifestyle  . Physical activity:    Days per week: Not on file    Minutes per session: Not on file  . Stress: Not on file  Relationships  . Social connections:    Talks on phone: Not on file    Gets together: Not on file    Attends religious service: Not on file    Active member of club or organization: Not on file    Attends meetings of clubs or organizations: Not on file    Relationship status: Not on file  . Intimate partner violence:    Fear of current or ex partner: Not on file    Emotionally abused: Not  on file    Physically abused: Not on file    Forced sexual activity: Not on file  Other Topics Concern  . Not on file  Social History Narrative  . Not on  file   Review of Systems  Constitutional: Positive for unexpected weight change. Negative for chills, fatigue and fever.  Respiratory: Negative for cough.   Gastrointestinal: Negative for abdominal pain, constipation, diarrhea, nausea and vomiting.  Genitourinary: Positive for vaginal pain. Negative for hematuria and vaginal discharge.  Skin: Negative for rash and wound.  Neurological: Negative for dizziness, weakness and headaches.       Objective:   Physical Exam  Constitutional: She is oriented to person, place, and time. She appears well-developed and well-nourished. No distress.  HENT:  Head: Normocephalic and atraumatic.  Eyes: Pupils are equal, round, and reactive to light. EOM are normal.  Neck: Neck supple.  Cardiovascular: Normal rate.  Pulmonary/Chest: Effort normal. No respiratory distress.  Abdominal: There is tenderness (minimal) in the suprapubic area. There is no CVA tenderness.  Genitourinary:  Genitourinary Comments: Erythema of the labia majora but non tender; did have some discomfort of the internal vagina with white discharge, and atrophy noted  Musculoskeletal: Normal range of motion.  Neurological: She is alert and oriented to person, place, and time.  Skin: Skin is warm and dry.  Psychiatric: She has a normal mood and affect. Her behavior is normal.  Nursing note and vitals reviewed.   Vitals:   11/10/17 1413  BP: 130/70  Pulse: 89  Temp: (!) 97.5 F (36.4 C)  TempSrc: Oral  SpO2: 94%  Weight: 128 lb 3.2 oz (58.2 kg)  Height: 5\' 4"  (1.626 m)   Results for orders placed or performed in visit on 11/10/17  POCT urinalysis dipstick  Result Value Ref Range   Color, UA yellow yellow   Clarity, UA cloudy (A) clear   Glucose, UA negative negative mg/dL   Bilirubin, UA negative negative    Ketones, POC UA trace (5) (A) negative mg/dL   Spec Grav, UA 1.020 1.010 - 1.025   Parker, UA negative negative   pH, UA 8.0 5.0 - 8.0   Protein Ur, POC negative negative mg/dL   Urobilinogen, UA 0.2 0.2 or 1.0 E.U./dL   Nitrite, UA Negative Negative   Leukocytes, UA Negative Negative  POCT Microscopic Urinalysis ([facility])  Result Value Ref Range   WBC,UR,HPF,POC Few (A) None WBC/hpf   RBC,UR,HPF,POC Few (A) None RBC/hpf   Bacteria Moderate (A) None, Too numerous to count   Mucus Absent Absent   Epithelial Cells, UR Per Microscopy Many (A) None, Too numerous to count cells/hpf  POCT Wet + KOH Prep  Result Value Ref Range   Yeast by KOH Absent Absent   Yeast by wet prep Absent Absent   WBC by wet prep Few Few   Clue Cells Wet Prep HPF POC Few (A) None   Trich by wet prep Absent Absent   Bacteria Wet Prep HPF POC Moderate (A) Few   Epithelial Cells By [employer] Pref ([facility]) Many (A) None, Few, Too numerous to count   RBC,UR,HPF,POC None None RBC/hpf       Assessment & Plan:   ROSSANNA SPITZLEY is a 84 y.o. female History of recurrent urinary tract infection - Plan: POCT urinalysis dipstick, POCT Microscopic Urinalysis ([facility]), Urine Culture, cephALEXin (KEFLEX) 500 MG capsule  Suprapubic pain - Plan: POCT urinalysis dipstick, POCT Microscopic Urinalysis ([facility]), Urine Culture, cephALEXin (KEFLEX) 500 MG capsule  Genitourinary pain - Plan: POCT Wet + KOH Prep, cephALEXin (KEFLEX) 500 MG capsule  Vaginitis and vulvovaginitis - Plan: POCT Wet + KOH Prep, fluconazole (DIFLUCAN) 150 MG tablet  History of recurrent UTI, but on some  episodes urine culture did not appear to be significantly infected.  Currently may have component of vulvovaginitis, most likely candidal but with negative in office testing as recent antibiotics.  However some suprapubic discomfort, recurrent UTI also possible.  -start Keflex 500 mg twice daily, check urine culture for infection, continue follow-up with urologist as  planned  -Diflucan 150 mg x 1 with repeat in 1 week and barrier cream or medication such as Vagisil over-the-counter to help with localized discomfort.  RTC precautions if not improving next few days.  Would continue Premarin cream for atrophic vaginitis. Meds ordered this encounter  Medications  . cephALEXin (KEFLEX) 500 MG capsule    Sig: Take 1 capsule (500 mg total) by mouth 2 (two) times daily.    Dispense:  20 capsule    Refill:  0  . fluconazole (DIFLUCAN) 150 MG tablet    Sig: Take 1 tablet (150 mg total) by mouth once for 1 dose. Repeat in 1 week if needed.    Dispense:  1 tablet    Refill:  1   Patient Instructions    I will check a urine culture, and can start antibiotic for now.  However I will also send in a prescription for diflucan as I am concerned about a possible yeast infection.  Testing in the office today did not indicate any specific concerns. Ok to use vagisil over the counter if needed and premarin twice per week. If pain is not improving in next few days, I would recommend follow up here or with primary provider.   Return to the clinic or go to the nearest emergency room if any of your symptoms worsen or new symptoms occur.    IF you received an x-Parker today, you will receive an invoice from [facility]. Please contact [facility] at [phone] with questions or concerns regarding your invoice.   IF you received labwork today, you will receive an invoice from [place]. Please contact [facility] at [phone] with questions or concerns regarding your invoice.   Our billing staff will not be able to assist you with questions regarding bills from these companies.  You will be contacted with the lab results as soon as they are available. The fastest way to get your results is to activate your My Chart account. Instructions are located on the last page of this paperwork. If you have not heard from [place] regarding the results in 2 weeks, please contact  this office.       I personally performed the services described in this documentation, which was scribed in my presence. The recorded information has been reviewed and considered for accuracy and completeness, addended by me as needed, and agree with information above.  Signed,   Melissa Ray, MD [facility].  11/11/17 4:29 PM

## 2017-11-10 NOTE — Patient Instructions (Addendum)
  I will check a urine culture, and can start antibiotic for now.  However I will also send in a prescription for diflucan as I am concerned about a possible yeast infection.  Testing in the office today did not indicate any specific concerns. Ok to use vagisil over the counter if needed and premarin twice per week. If pain is not improving in next few days, I would recommend follow up here or with primary provider.   Return to the clinic or go to the nearest emergency room if any of your symptoms worsen or new symptoms occur.    IF you received an x-ray today, you will receive an invoice from [facility]. Please contact [facility] at [phone] with questions or concerns regarding your invoice.   IF you received labwork today, you will receive an invoice from [place]. Please contact [facility] at [phone] with questions or concerns regarding your invoice.   Our billing staff will not be able to assist you with questions regarding bills from these companies.  You will be contacted with the lab results as soon as they are available. The fastest way to get your results is to activate your My Chart account. Instructions are located on the last page of this paperwork. If you have not heard from [place] regarding the results in 2 weeks, please contact this office.

## 2017-11-11 ENCOUNTER — Other Ambulatory Visit: Payer: Self-pay | Admitting: Internal Medicine

## 2017-11-11 DIAGNOSIS — C9110 Chronic lymphocytic leukemia of B-cell type not having achieved remission: Secondary | ICD-10-CM

## 2017-11-13 LAB — URINE CULTURE

## 2017-11-16 ENCOUNTER — Encounter: Payer: Self-pay | Admitting: Physician Assistant

## 2017-11-16 ENCOUNTER — Ambulatory Visit (INDEPENDENT_AMBULATORY_CARE_PROVIDER_SITE_OTHER): Payer: Medicare Other | Admitting: Physician Assistant

## 2017-11-16 ENCOUNTER — Other Ambulatory Visit: Payer: Self-pay

## 2017-11-16 VITALS — BP 108/70 | HR 78 | Temp 98.0°F | Resp 16 | Ht 64.0 in | Wt 136.6 lb

## 2017-11-16 DIAGNOSIS — B9689 Other specified bacterial agents as the cause of diseases classified elsewhere: Secondary | ICD-10-CM | POA: Diagnosis not present

## 2017-11-16 DIAGNOSIS — R3989 Other symptoms and signs involving the genitourinary system: Secondary | ICD-10-CM | POA: Diagnosis not present

## 2017-11-16 DIAGNOSIS — N760 Acute vaginitis: Secondary | ICD-10-CM | POA: Diagnosis not present

## 2017-11-16 DIAGNOSIS — R102 Pelvic and perineal pain: Secondary | ICD-10-CM | POA: Diagnosis not present

## 2017-11-16 LAB — POC MICROSCOPIC URINALYSIS (UMFC): Mucus: ABSENT

## 2017-11-16 LAB — POCT URINALYSIS DIP (MANUAL ENTRY)
BILIRUBIN UA: NEGATIVE mg/dL
Bilirubin, UA: NEGATIVE
Glucose, UA: NEGATIVE mg/dL
Leukocytes, UA: NEGATIVE
Nitrite, UA: NEGATIVE
PROTEIN UA: NEGATIVE mg/dL
RBC UA: NEGATIVE
SPEC GRAV UA: 1.015 (ref 1.010–1.025)
UROBILINOGEN UA: 0.2 U/dL
pH, UA: 7 (ref 5.0–8.0)

## 2017-11-16 LAB — POCT WET + KOH PREP
TRICH BY WET PREP: ABSENT
YEAST BY KOH: ABSENT
Yeast by wet prep: ABSENT

## 2017-11-16 MED ORDER — FLUCONAZOLE 150 MG PO TABS: 150.0000 mg | ORAL_TABLET | Freq: Once | ORAL | 0 refills | Status: AC

## 2017-11-16 MED ORDER — METRONIDAZOLE 500 MG PO TABS: 500.0000 mg | ORAL_TABLET | Freq: Two times a day (BID) | ORAL | 0 refills | Status: DC

## 2017-11-16 NOTE — Progress Notes (Signed)
Subjective:    Patient ID: [name], female    DOB: [birth year], 84 y.o.   MRN: [id number] Chief Complaint  Patient presents with  . Urinary Tract Infection    not getting any better still painful      HPI     Review of Systems   Patient Active Problem List   Diagnosis Date Noted  . Diarrhea 09/19/2017  . Constipation 09/12/2017  . Memory loss 07/25/2017  . Neck pain on left side 07/25/2017  . Urinary tract infection, recurrent 06/12/2017  . Thrush 02/10/2017  . Frequent urination 02/10/2017  . Osteoporosis 11/23/2016  . Abdominal pain 08/16/2016  . Urinary frequency 08/16/2016  . Peripheral edema 08/16/2016  . General weakness 08/16/2016  . Gait disorder 08/16/2016  . Dysuria 08/03/2016  . Syncope and collapse 05/10/2016  . Closed displaced simple supracondylar fracture of right humerus without intercondylar fracture 05/10/2016  . Urinary tract infection without hematuria 05/10/2016  . Syncope 05/10/2016  . Humeral head fracture, right, closed, initial encounter   . Burning tongue 03/25/2016  . Post concussion syndrome 03/03/2016  . Fall 03/03/2016  . Cystocele 03/05/2015  . Dehydration 02/08/2015  . Hyponatremia 02/08/2015  . Well adult exam 10/01/2014  . Grief at loss of child 12/11/2013  . Irregular heart beat 09/29/2013  . Humeral head fracture 08/26/2013  . Actinic keratoses 08/26/2013  . Carotid bruit 05/02/2012  . Anxiety 03/17/2011  . ANEMIA, PERNICIOUS, HX OF 10/10/2007  . Peripheral neuropathy ([place]) 09/04/2007  . Chronic fatigue 08/06/2007  . Chronic lymphocytic leukemia ([place]) 06/17/2007  . Allergic rhinitis 06/17/2007  . OSTEOARTHRITIS 06/17/2007  . B12 deficiency 06/07/2007  . GERD (gastroesophageal reflux disease) 04/30/2007  . Dyslipidemia 12/19/2006  . Essential hypertension 12/19/2006  . DIVERTICULOSIS, COLON 05/19/2003  . COLONIC POLYPS, HX OF 01/10/2000    Past Medical History:  Diagnosis Date  . Allergy    rhinitis  .  Chronic ethmoidal sinusitis   . Diverticulosis   . Diverticulosis of colon   . GERD (gastroesophageal reflux disease)   . Glaucoma   . Hx of colonic polyps   . Hyperlipidemia   . Hypertension   . Hypogammaglobulinemia ([place])    Monthly IVIG  . Leukemia ([place])    Chronic lymphocytic leukemia  . Osteoarthritis   . Peripheral neuropathy   . Pernicious anemia   . UTI (lower urinary tract infection) 2010  . Vitamin B12 deficiency     Prior to Admission medications   Medication Sig Start Date End Date Taking? Authorizing Provider  acetaminophen (TYLENOL) 500 MG tablet Take 500 mg by mouth 2 (two) times daily.    Yes [provider]  amLODipine (NORVASC) 2.5 MG tablet Take 1 tablet (2.5 mg total) by mouth daily. 02/27/17  Yes Plotnikov, Evie Lacks, MD  aspirin 81 MG EC tablet Take 81 mg by mouth daily.     Yes [provider]  B-D INS SYRINGE 0.5CC/30GX1/2" 30G X 1/2" 0.5 ML MISC USE TO ADMINISTER B12 INJECTIONS 06/13/17  Yes Plotnikov, Evie Lacks, MD  Bioflavonoid Products (GRAPE SEED PO) Take 1 tablet by mouth daily.   Yes [provider]  cephALEXin (KEFLEX) 500 MG capsule Take 1 capsule (500 mg total) by mouth 2 (two) times daily. 11/10/17  Yes Wendie Agreste, MD  Cholecalciferol (VITAMIN D3) 2000 units capsule Take 1 capsule (2,000 Units total) by mouth daily. 11/23/16  Yes Plotnikov, Evie Lacks, MD  conjugated estrogens (PREMARIN) vaginal cream 1/2 GRAM VAGINALLY EVERY NIGHT  X 1 WEEK, THEN TWICE WEEKLY AT BEDTIME 11/11/17  Yes Plotnikov, Evie Lacks, MD  cromolyn (OPTICROM) 4 % ophthalmic solution 1 DROP IN BOTH EYES FOUR TIMES A DAY 03/21/16  Yes [provider]  cyanocobalamin (,VITAMIN B-12,) 1000 MCG/ML injection ADMINISTER 1 CC SUBQ EVERY 4 WEEKS AS DIRECTED 08/07/16  Yes Plotnikov, Evie Lacks, MD  donepezil (ARICEPT) 5 MG tablet Take 1 tablet (5 mg total) by mouth at bedtime. 07/25/17  Yes Plotnikov, Evie Lacks, MD  Erythromycin 2 % ointment Apply to affected  area 2 times daily 05/12/17  Yes Dorie Rank, MD  fluticasone [facility]) 50 MCG/ACT nasal spray Place 2 sprays into both nostrils daily. 11/21/16  Yes Plotnikov, Evie Lacks, MD  folic acid (FOLVITE) 1 MG tablet Take 1 mg by mouth daily. For restless legs   Yes [provider]  gabapentin (NEURONTIN) 300 MG capsule TAKE 1 CAPSULE (300 MG TOTAL) BY MOUTH DAILY. 09/06/17  Yes Plotnikov, Evie Lacks, MD  GINSENG PO Take 1 tablet by mouth daily.   Yes [provider]  hydrochlorothiazide (MICROZIDE) 12.5 MG capsule Take 1 capsule (12.5 mg total) by mouth daily. For blood pressure 07/12/17  Yes Plotnikov, Evie Lacks, MD  Insulin Syringe-Needle U-100 (BD INSULIN SYRINGE U/F) 30G X 1/2" 0.5 ML MISC USE TO ADMINISTER B12 INJECTIONS 06/13/17  Yes [provider]  KLOR-CON M10 10 MEQ tablet TAKE 1 TABLET (10 MEQ TOTAL) BY MOUTH DAILY. 11/05/17  Yes Plotnikov, Evie Lacks, MD  latanoprost (XALATAN) 0.005 % ophthalmic solution PLACE 1 DROP INTO BOTH EYES AT BEDTIME. 11/11/17  Yes Plotnikov, Evie Lacks, MD  loratadine (CLARITIN) 10 MG tablet TAKE 1 TABLET (10 MG TOTAL) BY MOUTH DAILY. 08/23/17  Yes Plotnikov, Evie Lacks, MD  MAGNESIUM CHLORIDE PO Take 1 tablet by mouth daily.   Yes [provider]  Multiple Vitamins-Minerals (CENTRUM SILVER 50+WOMEN) TABS Take 1 tablet by mouth daily.    Yes [provider]  nitrofurantoin, macrocrystal-monohydrate, (MACROBID) 100 MG capsule Take 1 capsule (100 mg total) by mouth 2 (two) times daily. 10/25/17  Yes Marrian Salvage, FNP  RABEprazole (ACIPHEX) 20 MG tablet Take 1 tablet (20 mg total) by mouth 2 (two) times daily. 10/07/17  Yes Plotnikov, Evie Lacks, MD  rosuvastatin (CRESTOR) 5 MG tablet Take 1 tablet (5 mg total) by mouth daily. 08/21/17  Yes Plotnikov, Evie Lacks, MD  sucralfate (CARAFATE) 1 g tablet Take 1 tablet (1 g total) by mouth 4 (four) times daily -  with meals and at bedtime. 08/07/17  Yes Plotnikov, Evie Lacks, MD    Dexlansoprazole (DEXILANT) 30 MG capsule Take 1 capsule (30 mg total) by mouth daily. 07/25/17 10/12/17  Plotnikov, Evie Lacks, MD    Allergies  Allergen Reactions  . Pneumococcal Vaccines Swelling and Rash    Pneumococcal Vaccine-23 valent  . Clarithromycin Other (See Comments)    REACTION: sore mouth  . Hctz [Hydrochlorothiazide]     Low Na  . Oxycodone-Aspirin Other (See Comments)    REACTION: horrible nightmares  . Propoxyphene N-Acetaminophen Other (See Comments)    REACTION: couldn't wake her up        Objective:   Physical Exam  Constitutional:  BP 108/70   Pulse 78   Temp 98 F (36.7 C)   Resp 16   Ht 5\' 4"  (1.626 m)   Wt 136 lb 9.6 oz (62 kg)   SpO2 96%   BMI 23.45 kg/m  Assessment & Plan:

## 2017-11-16 NOTE — Progress Notes (Signed)
Patient ID: [name], female    DOB: 22-Sep-1925, 84 y.o.   MRN: [id number]  PCP: Cassandria Anger, MD  Chief Complaint  Patient presents with  . Urinary Tract Infection    not getting any better still painful     Subjective:   Presents for evaluation of persistent vaginal pain. She is here alone, her son in the lobby. She has documented diagnosis of dementia, but is appropriate with me and appears cognitively intact.  This 84 year old female with numerous medical problems, including CLL, spiculated nodule in the RIGHT upper lung, chronic constipation, generalized weakness, presented to her PCP 09/12/2017 with weakness, fatigue, stool incontinence, wanting placement at [facility] for long term care. She was diagnosed with fecal impaction. Normal plain films of the abdomen.  She presented to the ED on 09/16/2017 with weakness and constipation. UA consistent with UTI. She received ceftriaxone 1 g IV, and sent home with oral cephalexin, pending UCx, which revealed >100,000 colonies/mL Aerococcus viridans.  She returned to her PCP office on 10/08/2017 with increased urinary frequency and burning with urination intermittently since 06/2017. She had recently had diarrhea (she reports stool leakage due to obstipation), and reported being prone to UTI. The was treated empirically with nitrofurantoin. UCx ultimately revealed: Single organism less than 10,000 CFU/mL isolated. These organisms, commonly found on external and internal genitalia, are considered colonizers. No further testing performed..  She called her GYN office on 10/11/2017 with persistent symptoms are was seen 10/12/2017. She was noted to have second degree cystocele and rectocele, though thought unlikely to be the source of her symptoms, and atrophic changes. Abx changed from nitrofurantoin to SMX-TMP DS, and estrogen cream added for topical application to the external urethra. UCx ultimately revealed No Growth.  On 10/15/3017,  the provider from the 3/18 visit sent in oral fluconazole.  On 10/23/2017 she presented again to her PCP office with persistent symptoms. UA in the office notable for leukocytes. She was treated empirically with cephalexin. UCx ultimately revealed Enterococcus faecalis, senstivie to ampicillin, nitrofurantoin and vancomycin. Nitrofurantoin was sent in and she was referred to urology.  Re-evaluation on 4/12, with persistent and unchanged symptoms. Previous UCx results reviewed and she was prescribed Ampicillin. As she was not using the estrogen cream regularly, she was encouraged to do so. Repeat UCx ordered.  She contacted the [facility] on 4/19 reporting persistent urinary frequency and sensation of incomplete bladder emptying. Her PCP did not have availability, and so she was scheduled here with one of my colleagues. UA revealed moderate bacteria, Many epithelial cells and few WBC and RBC.Cephalexin reordered and added fluconazole for possible vulvovaginitis due to recurrent antibiotic treatments, and again encouraged her to use the estrogen vaginal cream. UCx revealed Enterococcus faecalis sensitive to Cipro, Levo, Nitrofurantoin, PCN and vancomycin, resistant to tetracycline.  Throughout, she has complained of fatigue. Of note, she was hypotensive on 4/02. She is receiving treatment for CLL, and her most recent infusion was 10/25/2017.  Today she reports no improvement. She relates that now she has persistent pain all the time, not just associated with urination. The pain continues to be INSIDE the vagina, not externally, and she describes it as both pressure and burning.  She relates continued episodic stool leakage, due to chronic constipation and admits to daily laxative use, despite previous advice against use of such products. She denies CP, SOB, HA, dizziness, nausea, vomiting.  Review of Systems As above.    Patient Active Problem  List   Diagnosis Date Noted  . Diarrhea  09/19/2017  . Constipation 09/12/2017  . Memory loss 07/25/2017  . Neck pain on left side 07/25/2017  . Urinary tract infection, recurrent 06/12/2017  . Thrush 02/10/2017  . Frequent urination 02/10/2017  . Osteoporosis 11/23/2016  . Abdominal pain 08/16/2016  . Urinary frequency 08/16/2016  . Peripheral edema 08/16/2016  . General weakness 08/16/2016  . Gait disorder 08/16/2016  . Dysuria 08/03/2016  . Syncope and collapse 05/10/2016  . Closed displaced simple supracondylar fracture of right humerus without intercondylar fracture 05/10/2016  . Urinary tract infection without hematuria 05/10/2016  . Syncope 05/10/2016  . Humeral head fracture, right, closed, initial encounter   . Burning tongue 03/25/2016  . Post concussion syndrome 03/03/2016  . Fall 03/03/2016  . Cystocele 03/05/2015  . Dehydration 02/08/2015  . Hyponatremia 02/08/2015  . Well adult exam 10/01/2014  . Grief at loss of child 12/11/2013  . Irregular heart beat 09/29/2013  . Humeral head fracture 08/26/2013  . Actinic keratoses 08/26/2013  . Carotid bruit 05/02/2012  . Anxiety 03/17/2011  . ANEMIA, PERNICIOUS, HX OF 10/10/2007  . Peripheral neuropathy ([place]) 09/04/2007  . Chronic fatigue 08/06/2007  . Chronic lymphocytic leukemia ([place]) 06/17/2007  . Allergic rhinitis 06/17/2007  . OSTEOARTHRITIS 06/17/2007  . B12 deficiency 06/07/2007  . GERD (gastroesophageal reflux disease) 04/30/2007  . Dyslipidemia 12/19/2006  . Essential hypertension 12/19/2006  . DIVERTICULOSIS, COLON 05/19/2003  . COLONIC POLYPS, HX OF 01/10/2000     Prior to Admission medications   Medication Sig Start Date End Date Taking? Authorizing Provider  acetaminophen (TYLENOL) 500 MG tablet Take 500 mg by mouth 2 (two) times daily.    Yes [provider]  amLODipine (NORVASC) 2.5 MG tablet Take 1 tablet (2.5 mg total) by mouth daily. 02/27/17  Yes Plotnikov, Evie Lacks, MD  aspirin 81 MG EC tablet Take 81 mg by mouth daily.      Yes [provider]  B-D INS SYRINGE 0.5CC/30GX1/2" 30G X 1/2" 0.5 ML MISC USE TO ADMINISTER B12 INJECTIONS 06/13/17  Yes Plotnikov, Evie Lacks, MD  Bioflavonoid Products (GRAPE SEED PO) Take 1 tablet by mouth daily.   Yes [provider]  cephALEXin (KEFLEX) 500 MG capsule Take 1 capsule (500 mg total) by mouth 2 (two) times daily. 11/10/17  Yes Wendie Agreste, MD  Cholecalciferol (VITAMIN D3) 2000 units capsule Take 1 capsule (2,000 Units total) by mouth daily. 11/23/16  Yes Plotnikov, Evie Lacks, MD  conjugated estrogens (PREMARIN) vaginal cream 1/2 GRAM VAGINALLY EVERY NIGHT X 1 WEEK, THEN TWICE WEEKLY AT BEDTIME 11/11/17  Yes Plotnikov, Evie Lacks, MD  cromolyn (OPTICROM) 4 % ophthalmic solution 1 DROP IN BOTH EYES FOUR TIMES A DAY 03/21/16  Yes [provider]  cyanocobalamin (,VITAMIN B-12,) 1000 MCG/ML injection ADMINISTER 1 CC SUBQ EVERY 4 WEEKS AS DIRECTED 08/07/16  Yes Plotnikov, Evie Lacks, MD  donepezil (ARICEPT) 5 MG tablet Take 1 tablet (5 mg total) by mouth at bedtime. 07/25/17  Yes Plotnikov, Evie Lacks, MD  Erythromycin 2 % ointment Apply to affected area 2 times daily 05/12/17  Yes Dorie Rank, MD  fluticasone [facility]) 50 MCG/ACT nasal spray Place 2 sprays into both nostrils daily. 11/21/16  Yes Plotnikov, Evie Lacks, MD  folic acid (FOLVITE) 1 MG tablet Take 1 mg by mouth daily. For restless legs   Yes [provider]  gabapentin (NEURONTIN) 300 MG capsule TAKE 1 CAPSULE (300 MG TOTAL) BY MOUTH DAILY. 09/06/17  Yes Plotnikov,  Evie Lacks, MD  GINSENG PO Take 1 tablet by mouth daily.   Yes [provider]  hydrochlorothiazide (MICROZIDE) 12.5 MG capsule Take 1 capsule (12.5 mg total) by mouth daily. For blood pressure 07/12/17  Yes Plotnikov, Evie Lacks, MD  Insulin Syringe-Needle U-100 (BD INSULIN SYRINGE U/F) 30G X 1/2" 0.5 ML MISC USE TO ADMINISTER B12 INJECTIONS 06/13/17  Yes [provider]  KLOR-CON M10 10 MEQ tablet TAKE 1 TABLET (10 MEQ  TOTAL) BY MOUTH DAILY. 11/05/17  Yes Plotnikov, Evie Lacks, MD  latanoprost (XALATAN) 0.005 % ophthalmic solution PLACE 1 DROP INTO BOTH EYES AT BEDTIME. 11/11/17  Yes Plotnikov, Evie Lacks, MD  loratadine (CLARITIN) 10 MG tablet TAKE 1 TABLET (10 MG TOTAL) BY MOUTH DAILY. 08/23/17  Yes Plotnikov, Evie Lacks, MD  MAGNESIUM CHLORIDE PO Take 1 tablet by mouth daily.   Yes [provider]  Multiple Vitamins-Minerals (CENTRUM SILVER 50+WOMEN) TABS Take 1 tablet by mouth daily.    Yes [provider]  nitrofurantoin, macrocrystal-monohydrate, (MACROBID) 100 MG capsule Take 1 capsule (100 mg total) by mouth 2 (two) times daily. 10/25/17  Yes Marrian Salvage, FNP  RABEprazole (ACIPHEX) 20 MG tablet Take 1 tablet (20 mg total) by mouth 2 (two) times daily. 10/07/17  Yes Plotnikov, Evie Lacks, MD  rosuvastatin (CRESTOR) 5 MG tablet Take 1 tablet (5 mg total) by mouth daily. 08/21/17  Yes Plotnikov, Evie Lacks, MD  sucralfate (CARAFATE) 1 g tablet Take 1 tablet (1 g total) by mouth 4 (four) times daily -  with meals and at bedtime. 08/07/17  Yes Plotnikov, Evie Lacks, MD  Dexlansoprazole (DEXILANT) 30 MG capsule Take 1 capsule (30 mg total) by mouth daily. 07/25/17 10/12/17  Plotnikov, Evie Lacks, MD     Allergies  Allergen Reactions  . Pneumococcal Vaccines Swelling and Rash    Pneumococcal Vaccine-23 valent  . Clarithromycin Other (See Comments)    REACTION: sore mouth  . Hctz [Hydrochlorothiazide]     Low Na  . Oxycodone-Aspirin Other (See Comments)    REACTION: horrible nightmares  . Propoxyphene N-Acetaminophen Other (See Comments)    REACTION: couldn't wake her up       Objective:  Physical Exam  Constitutional: She is oriented to person, place, and time. She appears well-developed and well-nourished. She is active and cooperative. No distress (frail appearing).  BP 108/70   Pulse 78   Temp 98 F (36.7 C)   Resp 16   Ht 5\' 4"  (1.626 m)   Wt 136 lb 9.6 oz (62 kg)   SpO2 96%    BMI 23.45 kg/m   HENT:  Head: Normocephalic and atraumatic.  Right Ear: Hearing normal.  Left Ear: Hearing normal.  Eyes: Conjunctivae are normal. No scleral icterus.  Neck: Normal range of motion. Neck supple. No thyromegaly present.  Cardiovascular: Normal rate, regular rhythm and normal heart sounds.  Pulses:      Radial pulses are 2+ on the right side, and 2+ on the left side.  Pulmonary/Chest: Effort normal and breath sounds normal.  Abdominal: Soft. Normal appearance and bowel sounds are normal. She exhibits no distension, no pulsatile liver, no fluid wave, no ascites, no pulsatile midline mass and no mass. There is no hepatosplenomegaly. There is tenderness in the suprapubic area. There is no rigidity, no rebound and no guarding. No hernia.  Genitourinary: Pelvic exam was performed with patient supine. No labial fusion. There is no rash, tenderness, lesion or injury on the right labia. There is no  rash, tenderness, lesion or injury on the left labia. Right adnexum displays no mass, no tenderness and no fullness. Left adnexum displays no mass, no tenderness and no fullness. There is tenderness (mild, worse ventrally) in the vagina. No erythema or bleeding in the vagina. No foreign body in the vagina. No signs of injury around the vagina. Vaginal discharge (small to moderate white creamy fluid in the vault) found.  Genitourinary Comments: Cervix is surgically absent. Pale vagina mucosa is moist without discrete lesions. Loss of vaginal rugae. No palpable stool in the rectal vault (patient reports large BM 2 days ago with laxative use)  Lymphadenopathy:       Head (right side): No tonsillar, no preauricular, no posterior auricular and no occipital adenopathy present.       Head (left side): No tonsillar, no preauricular, no posterior auricular and no occipital adenopathy present.    She has no cervical adenopathy. No inguinal adenopathy noted on the right or left side.       Right: No  supraclavicular adenopathy present.       Left: No supraclavicular adenopathy present.  Neurological: She is alert and oriented to person, place, and time. No sensory deficit.  Skin: Skin is warm, dry and intact. No rash noted. No cyanosis or erythema. Nails show no clubbing.  Psychiatric: She has a normal mood and affect. Her speech is normal and behavior is normal.      Results for orders placed or performed in visit on 11/16/17  POCT urinalysis dipstick  Result Value Ref Range   Color, UA yellow yellow   Clarity, UA clear clear   Glucose, UA negative negative mg/dL   Bilirubin, UA negative negative   Ketones, POC UA negative negative mg/dL   Spec Grav, UA 1.015 1.010 - 1.025   Blood, UA negative negative   pH, UA 7.0 5.0 - 8.0   Protein Ur, POC negative negative mg/dL   Urobilinogen, UA 0.2 0.2 or 1.0 E.U./dL   Nitrite, UA Negative Negative   Leukocytes, UA Negative Negative  POCT Wet + KOH Prep  Result Value Ref Range   Yeast by KOH Absent Absent   Yeast by wet prep Absent Absent   WBC by wet prep Few Few   Clue Cells Wet Prep HPF POC Moderate (A) None   Trich by wet prep Absent Absent   Bacteria Wet Prep HPF POC Moderate (A) Few   Epithelial Cells By [employer] Pref ([facility]) Many (A) None, Few, Too numerous to count   RBC,UR,HPF,POC Few (A) None RBC/hpf  POCT Microscopic Urinalysis ([facility])  Result Value Ref Range   WBC,UR,HPF,POC Few (A) None WBC/hpf   RBC,UR,HPF,POC Few (A) None RBC/hpf   Bacteria Few (A) None, Too numerous to count   Mucus Absent Absent   Epithelial Cells, UR Per Microscopy Many (A) None, Too numerous to count cells/hpf        Assessment & Plan:   1. Bladder pain 2. Pelvic pain in female Repeated antibiotic treatment, with several confirmed infections by culture. Suspect that symptoms now are due to vaginal and/or colorectal issues. - POCT urinalysis dipstick - POCT Wet + KOH Prep - POCT Microscopic Urinalysis ([facility])  3. BV (bacterial  vaginosis) Likely due to vaginal atrophy and recent antibiotic use for UTI. Flagyl followed by fluconazole, continued estrogen cream topically. Re-evaluate with PCP in the next 7-10 days. Would pursue additional GI evaluation if symptoms persist. - metroNIDAZOLE (FLAGYL) 500 MG tablet; Take 1 tablet (500 mg total) by  mouth 2 (two) times daily with a meal. DO NOT CONSUME ALCOHOL WHILE TAKING THIS MEDICATION.  Dispense: 14 tablet; Refill: 0 - fluconazole (DIFLUCAN) 150 MG tablet; Take 1 tablet (150 mg total) by mouth once for 1 dose. Take AFTER last dose of metronidazole  Dispense: 1 tablet; Refill: 0  Patient with spiculated RIGHT upper lobe nodule. Not discussed today, defer to PCP and oncologist.  Return if symptoms worsen or fail to improve.   Fara Chute, PA-C [facility]

## 2017-11-16 NOTE — Patient Instructions (Addendum)
Please contact Dr. Judeen Hammans office on Monday to schedule a follow-up visit.  CONTINUE the estrogen cream. Take the antibiotic WITH FOOD. After you finish the antibiotic, take the yeast pill (fluconazole).    IF you received an x-ray today, you will receive an invoice from [facility]. Please contact [facility] at [phone] with questions or concerns regarding your invoice.   IF you received labwork today, you will receive an invoice from [place]. Please contact [facility] at [phone] with questions or concerns regarding your invoice.   Our billing staff will not be able to assist you with questions regarding bills from these companies.  You will be contacted with the lab results as soon as they are available. The fastest way to get your results is to activate your My Chart account. Instructions are located on the last page of this paperwork. If you have not heard from [place] regarding the results in 2 weeks, please contact this office.      Atrophic Vaginitis Atrophic vaginitis is when the tissues that line the vagina become dry and thin. This is caused by a drop in estrogen. Estrogen helps:  To keep the vagina moist.  To make a clear fluid that helps: ? To lubricate the vagina for sex. ? To protect the vagina from infection.  If the lining of the vagina is dry and thin, it may:  Make sex painful. It may also cause bleeding.  Cause a feeling of: ? Burning. ? Irritation. ? Itchiness.  Make an exam of your vagina painful. It may also cause bleeding.  Make you lose interest in sex.  Cause a burning feeling when you pee.  Make your vaginal fluid (discharge) brown or yellow.  For some women, there are no symptoms. This condition is most common in women who do not get their regular menstrual periods anymore (menopause). This often starts when a woman is 36-19 years old. Follow these instructions at home:  Take medicines only as told by your doctor.  Do not use any herbal or alternative medicines unless your doctor says it is okay.  Use over-the-counter products for dryness only as told by your doctor. These include: ? Creams. ? Lubricants. ? Moisturizers.  Do not douche.  Do not use products that can make your vagina dry. These include: ? Scented feminine sprays. ? Scented tampons. ? Scented soaps.  If it hurts to have sex, tell your sexual partner. Contact a doctor if:  Your discharge looks different than normal.  Your vagina has an unusual smell.  You have new symptoms.  Your symptoms do not get better with treatment.  Your symptoms get worse. This information is not intended to replace advice given to you by your health care provider. Make sure you discuss any questions you have with your health care provider. Document Released: 12/27/2007 Document Revised: 12/16/2015 Document Reviewed: 07/01/2014 Elsevier Interactive Patient Education  2018 [employer]  Bacterial Vaginosis Bacterial vaginosis is an infection of the vagina. It happens when too many germs (bacteria) grow in the vagina. This infection puts you at risk for infections from sex (STIs). Treating this infection can lower your risk for some STIs. You should also treat this if you are pregnant. It can cause your baby to be born early. Follow these instructions at home: Medicines  Take over-the-counter and prescription medicines only as told by your doctor.  Take or use your antibiotic medicine as told by your doctor. Do not stop taking or using it even if you start  to feel better. General instructions  If you your sexual partner is a woman, tell her that you have this infection. She needs to get treatment if she has symptoms. If you have a female partner, he does not need to be treated.  During treatment: ? Avoid sex. ? Do not douche. ? Avoid alcohol as told. ? Avoid breastfeeding as told.  Drink enough fluid to keep your pee (urine) clear or pale  yellow.  Keep your vagina and butt (rectum) clean. ? Wash the area with warm water every day. ? Wipe from front to back after you use the toilet.  Keep all follow-up visits as told by your doctor. This is important. Preventing this condition  Do not douche.  Use only warm water to wash around your vagina.  Use protection when you have sex. This includes: ? Latex condoms. ? Dental dams.  Limit how many people you have sex with. It is best to only have sex with the same person (be monogamous).  Get tested for STIs. Have your partner get tested.  Wear underwear that is cotton or lined with cotton.  Avoid tight pants and pantyhose. This is most important in summer.  Do not use any products that have nicotine or tobacco in them. These include cigarettes and e-cigarettes. If you need help quitting, ask your doctor.  Do not use illegal drugs.  Limit how much alcohol you drink. Contact a doctor if:  Your symptoms do not get better, even after you are treated.  You have more discharge or pain when you pee (urinate).  You have a fever.  You have pain in your belly (abdomen).  You have pain with sex.  Your bleed from your vagina between periods. Summary  This infection happens when too many germs (bacteria) grow in the vagina.  Treating this condition can lower your risk for some infections from sex (STIs).  You should also treat this if you are pregnant. It can cause early (premature) birth.  Do not stop taking or using your antibiotic medicine even if you start to feel better. This information is not intended to replace advice given to you by your health care provider. Make sure you discuss any questions you have with your health care provider. Document Released: 04/18/2008 Document Revised: 03/25/2016 Document Reviewed: 03/25/2016 Elsevier Interactive Patient Education  2017 [employer].

## 2017-11-18 ENCOUNTER — Other Ambulatory Visit: Payer: Self-pay | Admitting: Internal Medicine

## 2017-11-20 ENCOUNTER — Ambulatory Visit (INDEPENDENT_AMBULATORY_CARE_PROVIDER_SITE_OTHER): Payer: Medicare Other | Admitting: Internal Medicine

## 2017-11-20 ENCOUNTER — Encounter: Payer: Self-pay | Admitting: Internal Medicine

## 2017-11-20 DIAGNOSIS — R300 Dysuria: Secondary | ICD-10-CM

## 2017-11-20 DIAGNOSIS — R350 Frequency of micturition: Secondary | ICD-10-CM

## 2017-11-20 DIAGNOSIS — R102 Pelvic and perineal pain: Secondary | ICD-10-CM | POA: Diagnosis not present

## 2017-11-20 DIAGNOSIS — N23 Unspecified renal colic: Secondary | ICD-10-CM | POA: Diagnosis not present

## 2017-11-20 DIAGNOSIS — Z87440 Personal history of urinary (tract) infections: Secondary | ICD-10-CM | POA: Diagnosis not present

## 2017-11-20 DIAGNOSIS — N390 Urinary tract infection, site not specified: Secondary | ICD-10-CM

## 2017-11-20 DIAGNOSIS — I10 Essential (primary) hypertension: Secondary | ICD-10-CM | POA: Diagnosis not present

## 2017-11-20 DIAGNOSIS — E538 Deficiency of other specified B group vitamins: Secondary | ICD-10-CM

## 2017-11-20 DIAGNOSIS — IMO0002 Reserved for concepts with insufficient information to code with codable children: Secondary | ICD-10-CM

## 2017-11-20 MED ORDER — MIRABEGRON ER 50 MG PO TB24: 50.0000 mg | ORAL_TABLET | Freq: Every day | ORAL | 11 refills | Status: AC

## 2017-11-20 MED ORDER — CLOTRIMAZOLE-BETAMETHASONE 1-0.05 % EX CREA: 1.0000 "application " | TOPICAL_CREAM | Freq: Two times a day (BID) | CUTANEOUS | 1 refills | Status: DC

## 2017-11-20 MED ORDER — CEPHALEXIN 500 MG PO CAPS: 500.0000 mg | ORAL_CAPSULE | Freq: Every day | ORAL | 1 refills | Status: DC

## 2017-11-20 NOTE — Assessment & Plan Note (Addendum)
Pt declined stopping HCTZ Start Myrbetriq Treat UTI Urology appt pending

## 2017-11-20 NOTE — Assessment & Plan Note (Signed)
Pt declined stopping HCTZ Start Myrbetriq Treat UTI Urology appt pending

## 2017-11-20 NOTE — Assessment & Plan Note (Signed)
Recurrent E coli, E faecalis

## 2017-11-20 NOTE — Assessment & Plan Note (Signed)
On Amlodipne, HCTZ Pt refused to stop HCTZ

## 2017-11-20 NOTE — Progress Notes (Signed)
Subjective:  Patient ID: [name], female    DOB: [birth year]  Age: 84 y.o. MRN: [id number]  CC: No chief complaint on file.   HPI [name] presents for chronic UTI and vaginal burning - persistent... C/o incontinence Taking Metronidazole now (per UC). She was on Keflex   Outpatient Medications Prior to Visit  Medication Sig Dispense Refill  . acetaminophen (TYLENOL) 500 MG tablet Take 500 mg by mouth 2 (two) times daily.     Marland Kitchen amLODipine (NORVASC) 2.5 MG tablet Take 1 tablet (2.5 mg total) by mouth daily. 90 tablet 3  . aspirin 81 MG EC tablet Take 81 mg by mouth daily.      . B-D INS SYRINGE 0.5CC/30GX1/2" 30G X 1/2" 0.5 ML MISC USE TO ADMINISTER B12 INJECTIONS 10 each 1  . Bioflavonoid Products (GRAPE SEED PO) Take 1 tablet by mouth daily.    . cephALEXin (KEFLEX) 500 MG capsule Take 1 capsule (500 mg total) by mouth 2 (two) times daily. 20 capsule 0  . Cholecalciferol (VITAMIN D3) 2000 units capsule Take 1 capsule (2,000 Units total) by mouth daily. 100 capsule 3  . conjugated estrogens (PREMARIN) vaginal cream 1/2 GRAM VAGINALLY EVERY NIGHT X 1 WEEK, THEN TWICE WEEKLY AT BEDTIME 90 g 2  . cromolyn (OPTICROM) 4 % ophthalmic solution 1 DROP IN BOTH EYES FOUR TIMES A DAY  12  . cyanocobalamin (,VITAMIN B-12,) 1000 MCG/ML injection ADMINISTER 1 CC SUBQ EVERY 4 WEEKS AS DIRECTED 10 mL 0  . donepezil (ARICEPT) 5 MG tablet Take 1 tablet (5 mg total) by mouth at bedtime. 30 tablet 5  . fluticasone (FLONASE) 50 MCG/ACT nasal spray Place 2 sprays into both nostrils daily. 48 g 0  . folic acid (FOLVITE) 1 MG tablet Take 1 mg by mouth daily. For restless legs    . gabapentin (NEURONTIN) 300 MG capsule TAKE 1 CAPSULE (300 MG TOTAL) BY MOUTH DAILY. 90 capsule 1  . GINSENG PO Take 1 tablet by mouth daily.    . hydrochlorothiazide (MICROZIDE) 12.5 MG capsule Take 1 capsule (12.5 mg total) by mouth daily. For blood pressure 90 capsule 1  . Insulin Syringe-Needle U-100 (BD INSULIN  SYRINGE U/F) 30G X 1/2" 0.5 ML MISC USE TO ADMINISTER B12 INJECTIONS    . KLOR-CON M10 10 MEQ tablet TAKE 1 TABLET (10 MEQ TOTAL) BY MOUTH DAILY. 90 tablet 1  . latanoprost (XALATAN) 0.005 % ophthalmic solution PLACE 1 DROP INTO BOTH EYES AT BEDTIME. 2.5 mL 1  . loratadine (CLARITIN) 10 MG tablet TAKE 1 TABLET (10 MG TOTAL) BY MOUTH DAILY. 90 tablet 0  . MAGNESIUM CHLORIDE PO Take 1 tablet by mouth daily.    . metroNIDAZOLE (FLAGYL) 500 MG tablet Take 1 tablet (500 mg total) by mouth 2 (two) times daily with a meal. DO NOT CONSUME ALCOHOL WHILE TAKING THIS MEDICATION. 14 tablet 0  . Multiple Vitamins-Minerals (CENTRUM SILVER 50+WOMEN) TABS Take 1 tablet by mouth daily.     . RABEprazole (ACIPHEX) 20 MG tablet Take 1 tablet (20 mg total) by mouth 2 (two) times daily. 60 tablet 11  . rosuvastatin (CRESTOR) 5 MG tablet Take 1 tablet (5 mg total) by mouth daily. 90 tablet 3  . sucralfate (CARAFATE) 1 g tablet Take 1 tablet (1 g total) by mouth 4 (four) times daily -  with meals and at bedtime. 120 tablet 5  . Dexlansoprazole (DEXILANT) 30 MG capsule Take 1 capsule (30 mg total) by mouth daily. 90 capsule  3  . Erythromycin 2 % ointment Apply to affected area 2 times daily 25 g 0  . nitrofurantoin, macrocrystal-monohydrate, (MACROBID) 100 MG capsule Take 1 capsule (100 mg total) by mouth 2 (two) times daily. 14 capsule 0   No facility-administered medications prior to visit.     ROS Review of Systems  Objective:  BP 128/78 (BP Location: Right Arm, Patient Position: Sitting, Cuff Size: Normal)   Pulse 62   Temp 97.7 F (36.5 C) (Oral)   Ht 5\' 4"  (1.626 m)   Wt 138 lb (62.6 kg)   SpO2 98%   BMI 23.69 kg/m   BP Readings from Last 3 Encounters:  11/20/17 128/78  11/16/17 108/70  11/10/17 130/70    Wt Readings from Last 3 Encounters:  11/20/17 138 lb (62.6 kg)  11/16/17 136 lb 9.6 oz (62 kg)  11/10/17 128 lb 3.2 oz (58.2 kg)    Physical Exam  Lab Results  Component Value Date    WBC 5.6 09/19/2017   HGB 13.7 09/19/2017   HCT 39.8 09/19/2017   PLT 178 09/19/2017   GLUCOSE 119 (H) 09/19/2017   CHOL 191 10/01/2014   TRIG 72.0 10/01/2014   HDL 57.80 10/01/2014   LDLCALC 119 (H) 10/01/2014   ALT 22 09/19/2017   AST 25 09/19/2017   NA 138 09/19/2017   K 3.4 (L) 09/19/2017   CL 99 (L) 09/19/2017   CREATININE 0.79 09/19/2017   BUN 10 09/19/2017   CO2 28 09/19/2017   TSH 3.38 12/03/2015   INR 1.05 05/10/2016   HGBA1C  10/07/2008    5.9 (NOTE)   The ADA recommends the following therapeutic goal for glycemic   control related to Hgb A1C measurement:   Goal of Therapy:   < 7.0% Hgb A1C   Reference: American Diabetes Association: Clinical Practice   Recommendations 2008, Diabetes Care,  2008, 31:(Suppl 1).   MICROALBUR 0.4 07/14/2009    US Breast Ltd Uni Left Inc Axilla  Result Date: 10/11/2017 CLINICAL DATA:  Left breast lump for 3 months, 12 o'clock region. Today patient describes symptoms as focal breast pain without lump. EXAM: DIGITAL DIAGNOSTIC BILATERAL MAMMOGRAM WITH CAD AND TOMO ULTRASOUND LEFT BREAST COMPARISON:  No previous exams available. Patient is unsure of the location of her prior mammograms. ACR Breast Density Category b: There are scattered areas of fibroglandular density. FINDINGS: Bilateral CC and MLO views were obtained, with 3D tomosynthesis, and with additional spot compression view of the upper left breast corresponding to the area of clinical concern, with overlying skin marker in place. There are no dominant masses, suspicious calcifications or secondary signs of malignancy within either breast. Specifically, there is no mammographic abnormality within the upper left breast corresponding to the area of clinical concern. Mammographic images were processed with CAD. Targeted ultrasound is performed, evaluating the upper left breast as directed by the patient, showing only normal fibroglandular tissues and fat lobules. No solid or cystic mass. No  enlarged lymph nodes. No skin thickening or evidence of soft tissue edema. IMPRESSION: No evidence of malignancy within either breast. Specifically, no evidence of malignancy within the upper left breast corresponding to the area of clinical concern. RECOMMENDATION: 1. Annual screening mammograms if clinically indicated. 2. The patient was instructed to return if the area that she feels becomes larger and/or firmer to palpation, or if a new palpable abnormality is identified in either breast. I have discussed the findings and recommendations with the patient. Results were also provided in writing at  the conclusion of the visit. If applicable, a reminder letter will be sent to the patient regarding the next appointment. BI-RADS CATEGORY  1: Negative. Electronically Signed   By: Franki Cabot M.D.   On: 10/11/2017 15:01   Mm Diag Breast Tomo Bilateral  Result Date: 10/11/2017 CLINICAL DATA:  Left breast lump for 3 months, 12 o'clock region. Today patient describes symptoms as focal breast pain without lump. EXAM: DIGITAL DIAGNOSTIC BILATERAL MAMMOGRAM WITH CAD AND TOMO ULTRASOUND LEFT BREAST COMPARISON:  No previous exams available. Patient is unsure of the location of her prior mammograms. ACR Breast Density Category b: There are scattered areas of fibroglandular density. FINDINGS: Bilateral CC and MLO views were obtained, with 3D tomosynthesis, and with additional spot compression view of the upper left breast corresponding to the area of clinical concern, with overlying skin marker in place. There are no dominant masses, suspicious calcifications or secondary signs of malignancy within either breast. Specifically, there is no mammographic abnormality within the upper left breast corresponding to the area of clinical concern. Mammographic images were processed with CAD. Targeted ultrasound is performed, evaluating the upper left breast as directed by the patient, showing only normal fibroglandular tissues and  fat lobules. No solid or cystic mass. No enlarged lymph nodes. No skin thickening or evidence of soft tissue edema. IMPRESSION: No evidence of malignancy within either breast. Specifically, no evidence of malignancy within the upper left breast corresponding to the area of clinical concern. RECOMMENDATION: 1. Annual screening mammograms if clinically indicated. 2. The patient was instructed to return if the area that she feels becomes larger and/or firmer to palpation, or if a new palpable abnormality is identified in either breast. I have discussed the findings and recommendations with the patient. Results were also provided in writing at the conclusion of the visit. If applicable, a reminder letter will be sent to the patient regarding the next appointment. BI-RADS CATEGORY  1: Negative. Electronically Signed   By: Franki Cabot M.D.   On: 10/11/2017 15:01    Assessment & Plan:   There are no diagnoses linked to this encounter. I have discontinued Rito Ehrlich. Polizzi's Erythromycin and nitrofurantoin (macrocrystal-monohydrate). I am also having her maintain her aspirin, acetaminophen, folic acid, cromolyn, cyanocobalamin, GINSENG PO, Bioflavonoid Products (GRAPE SEED PO), MAGNESIUM CHLORIDE PO, fluticasone, Vitamin D3, CENTRUM SILVER 50+WOMEN, amLODipine, B-D INS SYRINGE 0.5CC/30GX1/2", hydrochlorothiazide, donepezil, Dexlansoprazole, sucralfate, rosuvastatin, gabapentin, RABEprazole, Insulin Syringe-Needle U-100, KLOR-CON M10, conjugated estrogens, cephALEXin, latanoprost, metroNIDAZOLE, and loratadine.  No orders of the defined types were placed in this encounter.    Follow-up: No follow-ups on file.  Walker Kehr, MD

## 2017-11-20 NOTE — Assessment & Plan Note (Signed)
UTI and vaginal burning - persistent...  incontinence Taking Metronidazole now (per UC). She was on Keflex - take Keflex qd for prophylaxis Lotrisone vag cream Wet wipes

## 2017-11-20 NOTE — Assessment & Plan Note (Signed)
B-12 shot

## 2017-11-22 ENCOUNTER — Telehealth: Payer: Self-pay | Admitting: Internal Medicine

## 2017-11-22 ENCOUNTER — Inpatient Hospital Stay: Payer: Medicare Other | Attending: Oncology

## 2017-11-22 ENCOUNTER — Telehealth: Payer: Self-pay | Admitting: Oncology

## 2017-11-22 ENCOUNTER — Inpatient Hospital Stay (HOSPITAL_BASED_OUTPATIENT_CLINIC_OR_DEPARTMENT_OTHER): Payer: Medicare Other | Admitting: Oncology

## 2017-11-22 ENCOUNTER — Inpatient Hospital Stay: Payer: Medicare Other

## 2017-11-22 VITALS — BP 100/56 | HR 62 | Temp 97.8°F | Resp 17 | Ht 64.0 in | Wt 137.6 lb

## 2017-11-22 VITALS — BP 134/74 | HR 54 | Temp 97.8°F | Resp 16

## 2017-11-22 DIAGNOSIS — D696 Thrombocytopenia, unspecified: Secondary | ICD-10-CM

## 2017-11-22 DIAGNOSIS — N390 Urinary tract infection, site not specified: Secondary | ICD-10-CM | POA: Insufficient documentation

## 2017-11-22 DIAGNOSIS — D801 Nonfamilial hypogammaglobulinemia: Secondary | ICD-10-CM | POA: Insufficient documentation

## 2017-11-22 DIAGNOSIS — R300 Dysuria: Secondary | ICD-10-CM

## 2017-11-22 DIAGNOSIS — C9190 Lymphoid leukemia, unspecified not having achieved remission: Secondary | ICD-10-CM

## 2017-11-22 DIAGNOSIS — C9110 Chronic lymphocytic leukemia of B-cell type not having achieved remission: Secondary | ICD-10-CM

## 2017-11-22 DIAGNOSIS — R109 Unspecified abdominal pain: Secondary | ICD-10-CM | POA: Diagnosis not present

## 2017-11-22 DIAGNOSIS — R918 Other nonspecific abnormal finding of lung field: Secondary | ICD-10-CM | POA: Insufficient documentation

## 2017-11-22 LAB — CBC WITH DIFFERENTIAL (CANCER CENTER ONLY)
Basophils Absolute: 0 10*3/uL (ref 0.0–0.1)
Basophils Relative: 1 %
Eosinophils Absolute: 0.1 10*3/uL (ref 0.0–0.5)
Eosinophils Relative: 2 %
HEMATOCRIT: 41 % (ref 34.8–46.6)
HEMOGLOBIN: 13.7 g/dL (ref 11.6–15.9)
LYMPHS ABS: 1.6 10*3/uL (ref 0.9–3.3)
Lymphocytes Relative: 33 %
MCH: 31.4 pg (ref 25.1–34.0)
MCHC: 33.5 g/dL (ref 31.5–36.0)
MCV: 93.9 fL (ref 79.5–101.0)
MONOS PCT: 8 %
Monocytes Absolute: 0.4 10*3/uL (ref 0.1–0.9)
NEUTROS ABS: 2.8 10*3/uL (ref 1.5–6.5)
NEUTROS PCT: 56 %
Platelet Count: 162 10*3/uL (ref 145–400)
RBC: 4.37 MIL/uL (ref 3.70–5.45)
RDW: 14 % (ref 11.2–14.5)
WBC Count: 4.8 10*3/uL (ref 3.9–10.3)

## 2017-11-22 LAB — URINALYSIS, COMPLETE (UACMP) WITH MICROSCOPIC
BILIRUBIN URINE: NEGATIVE
Glucose, UA: NEGATIVE mg/dL
Hgb urine dipstick: NEGATIVE
Ketones, ur: NEGATIVE mg/dL
Nitrite: NEGATIVE
PH: 6 (ref 5.0–8.0)
Protein, ur: NEGATIVE mg/dL
SPECIFIC GRAVITY, URINE: 1.019 (ref 1.005–1.030)

## 2017-11-22 MED ORDER — IMMUNE GLOBULIN (HUMAN) 10 GM/100ML IV SOLN
30.0000 g | Freq: Once | INTRAVENOUS | Status: AC
  Administered 2017-11-22: 30 g via INTRAVENOUS
  Filled 2017-11-22: qty 100

## 2017-11-22 MED ORDER — DIPHENHYDRAMINE HCL 25 MG PO CAPS
25.0000 mg | ORAL_CAPSULE | Freq: Once | ORAL | Status: AC
  Administered 2017-11-22: 25 mg via ORAL

## 2017-11-22 MED ORDER — DIPHENHYDRAMINE HCL 25 MG PO CAPS
ORAL_CAPSULE | ORAL | Status: AC
  Filled 2017-11-22: qty 1

## 2017-11-22 NOTE — Telephone Encounter (Signed)
Scheduled appt per 5/2 los - sent reminder letter in the mail and left message for patient

## 2017-11-22 NOTE — Telephone Encounter (Signed)
lCopied from [place] [phone]. Topic: Quick Communication - See Telephone Encounter >> Nov 22, 2017  4:44 PM Clack, Laban Emperor wrote: CRM for notification. See Telephone encounter for: 11/22/17.  Pt states she has some questions about her RABEprazole (ACIPHEX) 20 MG tablet [[id number]] and sucralfate (CARAFATE) 1 g tablet [[id number]].   Her cancer doctor adv her the medication is to treat the same thing, pt wanted to verify if she should ne taking both medications.

## 2017-11-22 NOTE — Progress Notes (Signed)
  [facility] OFFICE PROGRESS NOTE   Diagnosis: CLL  INTERVAL HISTORY:   [name] returns as scheduled.  She continues monthly IVIG.  She feels this is decreasing the frequency of infections.  She is currently being treated for a urinary tract infection.  The dysuria has improved.  She would like a urinalysis checked today.  She reports obtaining a vaginal "yeast "infection after each course of antibiotics.  She has medication for the yeast infection. She has discomfort in the right abdomen.  Objective:  Vital signs in last 24 hours:  Blood pressure (!) 100/56, pulse 62, temperature 97.8 F (36.6 C), temperature source Oral, resp. rate 17, height 5\' 4"  (1.626 m), weight 137 lb 9.6 oz (62.4 kg), SpO2 95 %.    HEENT: White coat over the tongue, no buccal thrush Lymphatics: No cervical or supraclavicular nodes Resp: Lungs clear bilaterally Cardio: Regular rate and rhythm GI: No hepatosplenomegaly, mild tenderness in the right lower abdomen, no mass Vascular: No leg edema    Lab Results:  Lab Results  Component Value Date   WBC 4.8 11/22/2017   HGB 13.7 11/22/2017   HCT 41.0 11/22/2017   MCV 93.9 11/22/2017   PLT 162 11/22/2017   NEUTROABS 2.8 11/22/2017     Medications: I have reviewed the patient's current medications.   Assessment/Plan: 1. Chronic lymphocytic leukemia, asymptomatic. 2. History of hypogammaglobulinemia with recurrent infections. She continues monthly IVIG replacement therapy. 3. History of recurrent urinary tract infections. 4. History of mild thrombocytopenia secondary to CLL. 5. Question right upper lobe nodule on chest x-ray 05/09/2016; follow-up chest x-ray 09/19/2016 with more confluent irregular nodular density right upper lobe measuring 3 x 1.8 cm. repeat chest x-ray 01/09/2017-regression of irregular nodular density in the right upper lobe felt to represent interval scarring. Stable on  chest x-ray 05/01/2017   Disposition: [name] appears unchanged.  She will continue monthly IVIG.  She will return for an office visit in 4 months.  She has recurrent urinary tract infections.  She is currently completing a course of cephalexin.  We will repeat a urinalysis today.  There is no indication for treating the CLL.  15 minutes were with the patient today.  The majority of the time was used for counseling and coordination of care.  Betsy Coder, MD  11/22/2017  10:13 AM

## 2017-11-22 NOTE — Progress Notes (Signed)
Pt refused to stay 30 min post IVIG infusion.  Pt VS stable and discharged home.

## 2017-11-22 NOTE — Patient Instructions (Signed)

## 2017-11-23 LAB — URINE CULTURE: CULTURE: NO GROWTH

## 2017-11-23 NOTE — Telephone Encounter (Signed)
Pt notified what medications are for and notified that she is supposed to take both

## 2017-11-26 ENCOUNTER — Telehealth: Payer: Self-pay | Admitting: Emergency Medicine

## 2017-11-26 ENCOUNTER — Telehealth: Payer: Self-pay | Admitting: Internal Medicine

## 2017-11-26 DIAGNOSIS — R269 Unspecified abnormalities of gait and mobility: Secondary | ICD-10-CM

## 2017-11-26 NOTE — Telephone Encounter (Signed)
Copied from [place] [id number]. Topic: Quick Communication - See Telephone Encounter >> Nov 26, 2017 10:17 AM Rutherford Nail, NT wrote: CRM for notification. See Telephone encounter for: 11/26/17. Patient calling and states that she needs a Prior Authorization sent to her insurance company for Physical Therapy at home. CB#: [phone]

## 2017-11-26 NOTE — Telephone Encounter (Addendum)
Pt verbalized understanding of this note.   ----- Message from Ladell Pier, MD sent at 11/24/2017  5:45 PM EDT ----- Please call patient, urine culture is negative

## 2017-11-26 NOTE — Telephone Encounter (Signed)
Please advise about referral to home health for PT

## 2017-11-26 NOTE — Telephone Encounter (Signed)
Please advise 

## 2017-11-26 NOTE — Telephone Encounter (Signed)
Copied from [place] [phone]. Topic: Quick Communication - See Telephone Encounter >> Nov 26, 2017 10:19 AM Rutherford Nail, NT wrote: CRM for notification. See Telephone encounter for: 11/26/17. Patient states that she has a urinary specialist appointment on 12/14/17 and states that she is on an antibiotic until them. She is worried about being on an antibiotic for that long due to her having leukemia. She also states that she usually gets Yeast infections from the antibiotics as well. Would like to know if an antibiotic for the yeast infection can be sent to the pharmacy? [id number] [place], [place]   CB#: [phone]

## 2017-11-27 ENCOUNTER — Other Ambulatory Visit: Payer: Self-pay | Admitting: Internal Medicine

## 2017-11-27 ENCOUNTER — Telehealth: Payer: Self-pay | Admitting: Internal Medicine

## 2017-11-27 ENCOUNTER — Ambulatory Visit: Payer: Self-pay | Admitting: *Deleted

## 2017-11-27 MED ORDER — FLUCONAZOLE 150 MG PO TABS: 150.0000 mg | ORAL_TABLET | Freq: Once | ORAL | 1 refills | Status: AC

## 2017-11-27 NOTE — Telephone Encounter (Signed)
Use Align probiotic 1 a day OK Diflucan Thx

## 2017-11-27 NOTE — Telephone Encounter (Signed)
Okay home PT. Thank you

## 2017-11-27 NOTE — Telephone Encounter (Signed)
I returned her call.   She was seen by Dr. Alain Marion on 11/20/17 for a UTI and was started on Keflex.    She is c/o having diarrhea twice this morning.   "This always happens when I'm on an antibiotic".    "It usually gets worse and I can't get to the bathroom quickly".    "I'm weak and use a walker".     I have routed a high priority note to Dr. Alain Marion making him aware of her situation.   I let the pt know someone from the office will be in contact with her after discussing with Dr. Alain Marion.     Reason for Disposition . Weak immune system (e.g., HIV positive, cancer chemo, splenectomy, organ transplant, chronic steroids)    Due to age I chose this protocol.    She is on an antibiotic for a UTI that is causing diarrhea which started this morning.   Has been twice today with diarrhea.  Answer Assessment - Initial Assessment Questions 1. DIARRHEA SEVERITY: "How bad is the diarrhea?" "How many extra stools have you had in the past 24 hours than normal?"    - MILD: Few loose or mushy BMs; increase of 1-3 stools over normal daily number of stools; mild increase in ostomy output.   - MODERATE: Increase of 4-6 stools daily over normal; moderate increase in ostomy output.   - SEVERE (or Worst Possible): Increase of 7 or more stools daily over normal; moderate increase in ostomy output; incontinence.     Been to the bathroom twice with diarrhea this morning.   I'm on an antibiotic for a bladder infection.     2. ONSET: "When did the diarrhea begin?"      This morning.   The antibiotics always do this to me. 3. BM CONSISTENCY: "How loose or watery is the diarrhea?"      It's becoming worse.   I'm weak and using a walker and cannot get to the bathroom fast enough.   4. VOMITING: "Are you also vomiting?" If so, ask: "How many times in the past 24 hours?"      No 5. ABDOMINAL PAIN: "Are you having any abdominal pain?" If yes: "What does it feel like?" (e.g., crampy, dull, intermittent, constant)      No  pain. 6. ABDOMINAL PAIN SEVERITY: If present, ask: "How bad is the pain?"  (e.g., Scale 1-10; mild, moderate, or severe)    - MILD (1-3): doesn't interfere with normal activities, abdomen soft and not tender to touch     - MODERATE (4-7): interferes with normal activities or awakens from sleep, tender to touch     - SEVERE (8-10): excruciating pain, doubled over, unable to do any normal activities       Antibiotic causes diarrhea. 7. ORAL INTAKE: If vomiting, "Have you been able to drink liquids?" "How much fluids have you had in the past 24 hours?"     I'm drinking a lot every day. 8. HYDRATION: "Any signs of dehydration?" (e.g., dry mouth [not just dry lips], too weak to stand, dizziness, new weight loss) "When did you last urinate?"     No 9. EXPOSURE: "Have you traveled to a foreign country recently?" "Have you been exposed to anyone with diarrhea?" "Could you have eaten any food that was spoiled?"     No 10. OTHER SYMPTOMS: "Do you have any other symptoms?" (e.g., fever, blood in stool)       No 11. PREGNANCY: "Is there  any chance you are pregnant?" "When was your last menstrual period?"       Not asked due to age.  Protocols used: [facility]

## 2017-11-27 NOTE — Telephone Encounter (Signed)
Copied from [place] [phone]. Topic: Quick Communication - See Telephone Encounter >> Nov 27, 2017 12:18 PM Aurelio Brash B wrote: CRM for notification. See Telephone encounter for: 11/27/17.  Pt would like to speak with nurse, she wants to stop taking cephALEXin (KEFLEX) 500 MG capsule because it causes her diarrhea and yeast infection.  She states she had cancer treatment  on May 2. Dr Alain Marion wanted her to stay on antibiotic until she sees the urologist which is May 23.

## 2017-11-28 NOTE — Telephone Encounter (Signed)
Notified pt w/MD response.../lmb 

## 2017-11-28 NOTE — Telephone Encounter (Signed)
See previous msg MD advise pt to continue taking antibiotic until see urologist. He has ordered for pt to take [place] and also gave rx for diflucan. Pt has been notified.Marland KitchenJohny Chess

## 2017-11-30 ENCOUNTER — Telehealth: Payer: Self-pay | Admitting: Internal Medicine

## 2017-11-30 NOTE — Telephone Encounter (Signed)
Pt states the antibiotic makes her feel weak and she wanted to know if she can stop. Please advise.

## 2017-11-30 NOTE — Telephone Encounter (Signed)
Ok to stop Thx

## 2017-11-30 NOTE — Telephone Encounter (Signed)
Copied from [place] [phone]. Topic: Quick Communication - See Telephone Encounter >> Nov 30, 2017  2:07 PM Robina Ade, Helene Kelp D wrote: CRM for notification. See Telephone encounter for: 11/30/17. Patient called and said that the medication for her UTI that was given for is causing her to have a yeast infection and wants to know if she can stop taking the medicine. Please call patient back, thanks.

## 2017-12-03 NOTE — Telephone Encounter (Signed)
Pt.notified

## 2017-12-05 ENCOUNTER — Other Ambulatory Visit: Payer: Self-pay | Admitting: Family Medicine

## 2017-12-05 ENCOUNTER — Telehealth: Payer: Self-pay | Admitting: Internal Medicine

## 2017-12-05 ENCOUNTER — Ambulatory Visit: Payer: Self-pay | Admitting: *Deleted

## 2017-12-05 DIAGNOSIS — N760 Acute vaginitis: Secondary | ICD-10-CM

## 2017-12-05 NOTE — Telephone Encounter (Signed)
Copied from [place] [phone]. Topic: Quick Communication - See Telephone Encounter >> Dec 05, 2017  3:51 PM Arletha Grippe wrote: CRM for notification. See Telephone encounter for: 12/05/17. Elymira with [facility] called -  PT home health 2 week 4  Balance strength , and gait  Cb - [phone] leave message if no answer  Also verbal order for nursing eval

## 2017-12-05 NOTE — Telephone Encounter (Signed)
Spoke with Estill Bamberg at [facility]; she states that the prescription was filled yesterday; will notify pt.Marland Kitchen

## 2017-12-05 NOTE — Telephone Encounter (Signed)
Contacted pt and she states that the pharmacy said that "she can get the medicine on Friday 12/07/17".

## 2017-12-06 ENCOUNTER — Other Ambulatory Visit: Payer: Self-pay | Admitting: Internal Medicine

## 2017-12-06 ENCOUNTER — Telehealth: Payer: Self-pay | Admitting: Internal Medicine

## 2017-12-06 DIAGNOSIS — N760 Acute vaginitis: Secondary | ICD-10-CM

## 2017-12-06 NOTE — Telephone Encounter (Signed)
Copied from [place]. Topic: Quick Communication - See Telephone Encounter >> Dec 06, 2017  9:15 AM Ether Griffins B wrote: CRM for notification. See Telephone encounter for: 12/06/17.  Almira PT with [facility] requesting verbal order for nursing evaluation. CB# [phone]

## 2017-12-06 NOTE — Telephone Encounter (Signed)
Verbals given, FYI 

## 2017-12-06 NOTE — Telephone Encounter (Signed)
Called pt no answer LMOM w/MD response../lmb 

## 2017-12-06 NOTE — Telephone Encounter (Signed)
Ok Thx 

## 2017-12-07 ENCOUNTER — Telehealth: Payer: Self-pay | Admitting: Internal Medicine

## 2017-12-07 ENCOUNTER — Other Ambulatory Visit: Payer: Self-pay | Admitting: Internal Medicine

## 2017-12-07 DIAGNOSIS — N760 Acute vaginitis: Secondary | ICD-10-CM

## 2017-12-07 NOTE — Telephone Encounter (Signed)
Pt has a yeast infection and is really hoping this can be called in.

## 2017-12-07 NOTE — Telephone Encounter (Signed)
Copied from [place] [phone]. Topic: Quick Communication - See Telephone Encounter >> Dec 07, 2017  8:59 AM Synthia Innocent wrote: CRM for notification. See Telephone encounter for: 12/07/17. Requesting verbal order for OT, 1x a week for 1 week, 2x a week for 7 weeks

## 2017-12-07 NOTE — Telephone Encounter (Signed)
Ok Thx 

## 2017-12-07 NOTE — Telephone Encounter (Signed)
Notified Joan w/MD response../lmb 

## 2017-12-12 ENCOUNTER — Telehealth: Payer: Self-pay | Admitting: Internal Medicine

## 2017-12-12 NOTE — Telephone Encounter (Signed)
Copied from [place]. Topic: Inquiry >> Dec 12, 2017 12:12 PM Pricilla Handler wrote: Reason for CRM: Isha with [facility] [phone]) called stating that the patient wants to continue PT with [facility]. Isha has requested PT 2 Times a Week for 4 Weeks, and Once a Week for 1 Week afterwards. Please call Isha at [phone]. Thank You!!!

## 2017-12-13 ENCOUNTER — Encounter: Payer: Self-pay | Admitting: Internal Medicine

## 2017-12-13 ENCOUNTER — Ambulatory Visit (INDEPENDENT_AMBULATORY_CARE_PROVIDER_SITE_OTHER): Payer: Medicare Other | Admitting: Internal Medicine

## 2017-12-13 VITALS — BP 118/82 | HR 60 | Temp 98.4°F | Ht 64.0 in | Wt 135.0 lb

## 2017-12-13 DIAGNOSIS — R300 Dysuria: Secondary | ICD-10-CM | POA: Diagnosis not present

## 2017-12-13 DIAGNOSIS — R350 Frequency of micturition: Secondary | ICD-10-CM | POA: Diagnosis not present

## 2017-12-13 DIAGNOSIS — E538 Deficiency of other specified B group vitamins: Secondary | ICD-10-CM | POA: Diagnosis not present

## 2017-12-13 DIAGNOSIS — C9110 Chronic lymphocytic leukemia of B-cell type not having achieved remission: Secondary | ICD-10-CM

## 2017-12-13 DIAGNOSIS — Z0000 Encounter for general adult medical examination without abnormal findings: Secondary | ICD-10-CM | POA: Diagnosis not present

## 2017-12-13 DIAGNOSIS — N390 Urinary tract infection, site not specified: Secondary | ICD-10-CM

## 2017-12-13 DIAGNOSIS — K146 Glossodynia: Secondary | ICD-10-CM

## 2017-12-13 DIAGNOSIS — B370 Candidal stomatitis: Secondary | ICD-10-CM

## 2017-12-13 DIAGNOSIS — F0280 Dementia in other diseases classified elsewhere without behavioral disturbance: Secondary | ICD-10-CM | POA: Diagnosis not present

## 2017-12-13 DIAGNOSIS — G301 Alzheimer's disease with late onset: Secondary | ICD-10-CM | POA: Diagnosis not present

## 2017-12-13 DIAGNOSIS — N814 Uterovaginal prolapse, unspecified: Secondary | ICD-10-CM

## 2017-12-13 DIAGNOSIS — N760 Acute vaginitis: Secondary | ICD-10-CM

## 2017-12-13 DIAGNOSIS — Z862 Personal history of diseases of the blood and blood-forming organs and certain disorders involving the immune mechanism: Secondary | ICD-10-CM

## 2017-12-13 MED ORDER — MEMANTINE HCL-DONEPEZIL HCL ER 28-10 MG PO CP24: 1.0000 | ORAL_CAPSULE | Freq: Every day | ORAL | 11 refills | Status: DC

## 2017-12-13 MED ORDER — TRIAMCINOLONE ACETONIDE 0.1 % EX OINT: 1.0000 "application " | TOPICAL_OINTMENT | Freq: Two times a day (BID) | CUTANEOUS | 2 refills | Status: DC

## 2017-12-13 MED ORDER — MAGIC MOUTHWASH W/LIDOCAINE: 5.0000 mL | Freq: Three times a day (TID) | ORAL | 2 refills | Status: DC | PRN

## 2017-12-13 MED ORDER — CYANOCOBALAMIN 1000 MCG/ML IJ SOLN
1000.0000 ug | Freq: Once | INTRAMUSCULAR | Status: AC
  Administered 2017-12-13: 1000 ug via INTRAMUSCULAR

## 2017-12-13 MED ORDER — FLUCONAZOLE 150 MG PO TABS: 150.0000 mg | ORAL_TABLET | Freq: Once | ORAL | 3 refills | Status: AC

## 2017-12-13 NOTE — Assessment & Plan Note (Signed)
Chronic sx's Urol appt pending

## 2017-12-13 NOTE — Addendum Note (Signed)
Addended by: Karren Cobble on: 12/13/2017 10:22 AM   Modules accepted: Orders

## 2017-12-13 NOTE — Progress Notes (Addendum)
Subjective:  Patient ID: [name], female    DOB: 06-Jul-1926  Age: 84 y.o. MRN: [id number]  CC: No chief complaint on file.   HPI ARYONA SILL presents for a well exam. Comes w/her son  C/o mouth burning, vaginal discomfort and memory loss...  Outpatient Medications Prior to Visit  Medication Sig Dispense Refill  . acetaminophen (TYLENOL) 500 MG tablet Take 500 mg by mouth 2 (two) times daily.     Marland Kitchen aspirin 81 MG EC tablet Take 81 mg by mouth daily.      . B-D INS SYRINGE 0.5CC/30GX1/2" 30G X 1/2" 0.5 ML MISC USE TO ADMINISTER B12 INJECTIONS 10 each 1  . Bioflavonoid Products (GRAPE SEED PO) Take 1 tablet by mouth daily.    . cephALEXin (KEFLEX) 500 MG capsule Take 1 capsule (500 mg total) by mouth daily. 30 capsule 1  . Cholecalciferol (VITAMIN D3) 2000 units capsule Take 1 capsule (2,000 Units total) by mouth daily. 100 capsule 3  . clotrimazole-betamethasone (LOTRISONE) cream Apply 1 application topically 2 (two) times daily. 90 g 1  . conjugated estrogens (PREMARIN) vaginal cream 1/2 GRAM VAGINALLY EVERY NIGHT X 1 WEEK, THEN TWICE WEEKLY AT BEDTIME 90 g 2  . cromolyn (OPTICROM) 4 % ophthalmic solution 1 DROP IN BOTH EYES FOUR TIMES A DAY  12  . cyanocobalamin (,VITAMIN B-12,) 1000 MCG/ML injection ADMINISTER 1 CC SUBQ EVERY 4 WEEKS AS DIRECTED 10 mL 0  . donepezil (ARICEPT) 5 MG tablet Take 1 tablet (5 mg total) by mouth at bedtime. 30 tablet 5  . fluticasone (FLONASE) 50 MCG/ACT nasal spray Place 2 sprays into both nostrils daily. 48 g 0  . folic acid (FOLVITE) 1 MG tablet Take 1 mg by mouth daily. For restless legs    . gabapentin (NEURONTIN) 300 MG capsule TAKE 1 CAPSULE (300 MG TOTAL) BY MOUTH DAILY. 90 capsule 0  . GINSENG PO Take 1 tablet by mouth daily.    . hydrochlorothiazide (MICROZIDE) 12.5 MG capsule Take 1 capsule (12.5 mg total) by mouth daily. For blood pressure 90 capsule 1  . Insulin Syringe-Needle U-100 (BD INSULIN SYRINGE U/F) 30G X 1/2" 0.5 ML MISC USE TO  ADMINISTER B12 INJECTIONS    . KLOR-CON M10 10 MEQ tablet TAKE 1 TABLET (10 MEQ TOTAL) BY MOUTH DAILY. 90 tablet 1  . latanoprost (XALATAN) 0.005 % ophthalmic solution PLACE 1 DROP INTO BOTH EYES AT BEDTIME. 2.5 mL 1  . loratadine (CLARITIN) 10 MG tablet TAKE 1 TABLET (10 MG TOTAL) BY MOUTH DAILY. 90 tablet 0  . MAGNESIUM CHLORIDE PO Take 1 tablet by mouth daily.    . mirabegron ER (MYRBETRIQ) 50 MG TB24 tablet Take 1 tablet (50 mg total) by mouth daily. 30 tablet 11  . Multiple Vitamins-Minerals (CENTRUM SILVER 50+WOMEN) TABS Take 1 tablet by mouth daily.     . RABEprazole (ACIPHEX) 20 MG tablet Take 1 tablet (20 mg total) by mouth 2 (two) times daily. 60 tablet 11  . rosuvastatin (CRESTOR) 5 MG tablet Take 1 tablet (5 mg total) by mouth daily. 90 tablet 3  . sucralfate (CARAFATE) 1 g tablet Take 1 tablet (1 g total) by mouth 4 (four) times daily -  with meals and at bedtime. 120 tablet 5  . Dexlansoprazole (DEXILANT) 30 MG capsule Take 1 capsule (30 mg total) by mouth daily. 90 capsule 3   No facility-administered medications prior to visit.     ROS Review of Systems  Constitutional: Positive for fatigue.  Negative for activity change, appetite change, chills and unexpected weight change.  HENT: Negative for congestion, mouth sores and sinus pressure.   Eyes: Negative for visual disturbance.  Respiratory: Negative for cough and chest tightness.   Gastrointestinal: Negative for abdominal pain and nausea.  Genitourinary: Positive for frequency, urgency and vaginal pain. Negative for difficulty urinating.  Musculoskeletal: Positive for back pain. Negative for gait problem.  Skin: Negative for pallor and rash.  Neurological: Positive for weakness. Negative for dizziness, tremors, numbness and headaches.  Psychiatric/Behavioral: Positive for decreased concentration, dysphoric mood and sleep disturbance. Negative for confusion and suicidal ideas. The patient is nervous/anxious.     Objective:   BP 118/82 (BP Location: Right Arm, Patient Position: Sitting, Cuff Size: Normal)   Pulse 60   Temp 98.4 F (36.9 C) (Oral)   Ht 5\' 4"  (1.626 m)   Wt 135 lb (61.2 kg)   SpO2 98%   BMI 23.17 kg/m   BP Readings from Last 3 Encounters:  12/13/17 118/82  11/22/17 134/74  11/22/17 (!) 100/56    Wt Readings from Last 3 Encounters:  12/13/17 135 lb (61.2 kg)  11/22/17 137 lb 9.6 oz (62.4 kg)  11/20/17 138 lb (62.6 kg)    Physical Exam  Constitutional: She appears well-developed. No distress.  HENT:  Head: Normocephalic.  Right Ear: External ear normal.  Left Ear: External ear normal.  Nose: Nose normal.  Mouth/Throat: Oropharynx is clear and moist.  Eyes: Pupils are equal, round, and reactive to light. Conjunctivae are normal. Right eye exhibits no discharge. Left eye exhibits no discharge.  Neck: Normal range of motion. Neck supple. No JVD present. No tracheal deviation present. No thyromegaly present.  Cardiovascular: Normal rate, regular rhythm and normal heart sounds.  Pulmonary/Chest: No stridor. No respiratory distress. She has no wheezes.  Abdominal: Soft. Bowel sounds are normal. She exhibits no distension and no mass. There is no tenderness. There is no rebound and no guarding.  Musculoskeletal: She exhibits no edema or tenderness.  Lymphadenopathy:    She has no cervical adenopathy.  Neurological: She displays normal reflexes. No cranial nerve deficit. She exhibits normal muscle tone. Coordination abnormal.  Skin: No rash noted. No erythema.  Psychiatric: She has a normal mood and affect. Her behavior is normal. Judgment and thought content normal.   In a w/c Alert, cooperative  Lab Results  Component Value Date   WBC 4.8 11/22/2017   HGB 13.7 11/22/2017   HCT 41.0 11/22/2017   PLT 162 11/22/2017   GLUCOSE 119 (H) 09/19/2017   CHOL 191 10/01/2014   TRIG 72.0 10/01/2014   HDL 57.80 10/01/2014   LDLCALC 119 (H) 10/01/2014   ALT 22 09/19/2017   AST 25  09/19/2017   NA 138 09/19/2017   K 3.4 (L) 09/19/2017   CL 99 (L) 09/19/2017   CREATININE 0.79 09/19/2017   BUN 10 09/19/2017   CO2 28 09/19/2017   TSH 3.38 12/03/2015   INR 1.05 05/10/2016   HGBA1C  10/07/2008    5.9 (NOTE)   The ADA recommends the following therapeutic goal for glycemic   control related to Hgb A1C measurement:   Goal of Therapy:   < 7.0% Hgb A1C   Reference: American Diabetes Association: Clinical Practice   Recommendations 2008, Diabetes Care,  2008, 31:(Suppl 1).   MICROALBUR 0.4 07/14/2009    US Breast Ltd Uni Left Inc Axilla  Result Date: 10/11/2017 CLINICAL DATA:  Left breast lump for 3 months, 12 o'clock region. Today  patient describes symptoms as focal breast pain without lump. EXAM: DIGITAL DIAGNOSTIC BILATERAL MAMMOGRAM WITH CAD AND TOMO ULTRASOUND LEFT BREAST COMPARISON:  No previous exams available. Patient is unsure of the location of her prior mammograms. ACR Breast Density Category b: There are scattered areas of fibroglandular density. FINDINGS: Bilateral CC and MLO views were obtained, with 3D tomosynthesis, and with additional spot compression view of the upper left breast corresponding to the area of clinical concern, with overlying skin marker in place. There are no dominant masses, suspicious calcifications or secondary signs of malignancy within either breast. Specifically, there is no mammographic abnormality within the upper left breast corresponding to the area of clinical concern. Mammographic images were processed with CAD. Targeted ultrasound is performed, evaluating the upper left breast as directed by the patient, showing only normal fibroglandular tissues and fat lobules. No solid or cystic mass. No enlarged lymph nodes. No skin thickening or evidence of soft tissue edema. IMPRESSION: No evidence of malignancy within either breast. Specifically, no evidence of malignancy within the upper left breast corresponding to the area of clinical concern.  RECOMMENDATION: 1. Annual screening mammograms if clinically indicated. 2. The patient was instructed to return if the area that she feels becomes larger and/or firmer to palpation, or if a new palpable abnormality is identified in either breast. I have discussed the findings and recommendations with the patient. Results were also provided in writing at the conclusion of the visit. If applicable, a reminder letter will be sent to the patient regarding the next appointment. BI-RADS CATEGORY  1: Negative. Electronically Signed   By: Franki Cabot M.D.   On: 10/11/2017 15:01   Mm Diag Breast Tomo Bilateral  Result Date: 10/11/2017 CLINICAL DATA:  Left breast lump for 3 months, 12 o'clock region. Today patient describes symptoms as focal breast pain without lump. EXAM: DIGITAL DIAGNOSTIC BILATERAL MAMMOGRAM WITH CAD AND TOMO ULTRASOUND LEFT BREAST COMPARISON:  No previous exams available. Patient is unsure of the location of her prior mammograms. ACR Breast Density Category b: There are scattered areas of fibroglandular density. FINDINGS: Bilateral CC and MLO views were obtained, with 3D tomosynthesis, and with additional spot compression view of the upper left breast corresponding to the area of clinical concern, with overlying skin marker in place. There are no dominant masses, suspicious calcifications or secondary signs of malignancy within either breast. Specifically, there is no mammographic abnormality within the upper left breast corresponding to the area of clinical concern. Mammographic images were processed with CAD. Targeted ultrasound is performed, evaluating the upper left breast as directed by the patient, showing only normal fibroglandular tissues and fat lobules. No solid or cystic mass. No enlarged lymph nodes. No skin thickening or evidence of soft tissue edema. IMPRESSION: No evidence of malignancy within either breast. Specifically, no evidence of malignancy within the upper left breast  corresponding to the area of clinical concern. RECOMMENDATION: 1. Annual screening mammograms if clinically indicated. 2. The patient was instructed to return if the area that she feels becomes larger and/or firmer to palpation, or if a new palpable abnormality is identified in either breast. I have discussed the findings and recommendations with the patient. Results were also provided in writing at the conclusion of the visit. If applicable, a reminder letter will be sent to the patient regarding the next appointment. BI-RADS CATEGORY  1: Negative. Electronically Signed   By: Franki Cabot M.D.   On: 10/11/2017 15:01    Assessment & Plan:   There are  no diagnoses linked to this encounter. I am having Rito Ehrlich. Sanderson maintain her aspirin, acetaminophen, folic acid, cromolyn, cyanocobalamin, GINSENG PO, Bioflavonoid Products (GRAPE SEED PO), MAGNESIUM CHLORIDE PO, fluticasone, Vitamin D3, CENTRUM SILVER 50+WOMEN, B-D INS SYRINGE 0.5CC/30GX1/2", hydrochlorothiazide, donepezil, Dexlansoprazole, sucralfate, rosuvastatin, RABEprazole, Insulin Syringe-Needle U-100, KLOR-CON M10, conjugated estrogens, latanoprost, loratadine, cephALEXin, clotrimazole-betamethasone, mirabegron ER, and gabapentin.  No orders of the defined types were placed in this encounter.    Follow-up: No follow-ups on file.  Walker Kehr, MD

## 2017-12-13 NOTE — Assessment & Plan Note (Signed)
Recurrent sx's Mouthwash Rx prn

## 2017-12-13 NOTE — Assessment & Plan Note (Addendum)
Chronic sx's Urol appt pending

## 2017-12-13 NOTE — Assessment & Plan Note (Signed)
On B12 shots 

## 2017-12-13 NOTE — Telephone Encounter (Signed)
Okay. Thank you.

## 2017-12-13 NOTE — Assessment & Plan Note (Signed)
Here for medicare wellness/physical  Diet: heart healthy  Physical activity: sedentary  Depression/mood screen: negative  Hearing: intact to whispered voice  Visual acuity: grossly normal w/glasses, performs annual eye exam  ADLs: capable  Fall risk: mild to moderate Home safety: good  Cognitive evaluation: MCD EOL planning: adv directives, full code/ I agree  I have personally reviewed and have noted  1. The patient's medical, surgical and social history  2. Their use of alcohol, tobacco or illicit drugs  3. Their current medications and supplements  4. The patient's functional ability including ADL's, fall risks, home safety risks and hearing or visual impairment. No driving at night. 5. Diet and physical activities  6. Evidence for depression or mood disorders    Today patient counseled on age appropriate routine health concerns for screening and prevention, each reviewed and up to date or declined. Immunizations reviewed and up to date or declined. Labs ordered and reviewed. Risk factors for depression reviewed and negative. Hearing function and visual acuity are intact. ADLs screened and addressed as needed. Functional ability and level of safety reviewed and appropriate. Education, counseling and referrals performed based on assessed risks today. Patient provided with a copy of personalized plan for preventive services.

## 2017-12-13 NOTE — Assessment & Plan Note (Signed)
Worse: changed to Donep-Memantadine

## 2017-12-13 NOTE — Patient Instructions (Signed)
Health Maintenance for Postmenopausal Women Menopause is a normal process in which your reproductive ability comes to an end. This process happens gradually over a span of months to years, usually between the ages of 22 and 9. Menopause is complete when you have missed 12 consecutive menstrual periods. It is important to talk with your health care provider about some of the most common conditions that affect postmenopausal women, such as heart disease, cancer, and bone loss (osteoporosis). Adopting a healthy lifestyle and getting preventive care can help to promote your health and wellness. Those actions can also lower your chances of developing some of these common conditions. What should I know about menopause? During menopause, you may experience a number of symptoms, such as:  Moderate-to-severe hot flashes.  Night sweats.  Decrease in sex drive.  Mood swings.  Headaches.  Tiredness.  Irritability.  Memory problems.  Insomnia.  Choosing to treat or not to treat menopausal changes is an individual decision that you make with your health care provider. What should I know about hormone replacement therapy and supplements? Hormone therapy products are effective for treating symptoms that are associated with menopause, such as hot flashes and night sweats. Hormone replacement carries certain risks, especially as you become older. If you are thinking about using estrogen or estrogen with progestin treatments, discuss the benefits and risks with your health care provider. What should I know about heart disease and stroke? Heart disease, heart attack, and stroke become more likely as you age. This may be due, in part, to the hormonal changes that your body experiences during menopause. These can affect how your body processes dietary fats, triglycerides, and cholesterol. Heart attack and stroke are both medical emergencies. There are many things that you can do to help prevent heart disease  and stroke:  Have your blood pressure checked at least every 1-2 years. High blood pressure causes heart disease and increases the risk of stroke.  If you are 53-22 years old, ask your health care provider if you should take aspirin to prevent a heart attack or a stroke.  Do not use any tobacco products, including cigarettes, chewing tobacco, or electronic cigarettes. If you need help quitting, ask your health care provider.  It is important to eat a healthy diet and maintain a healthy weight. ? Be sure to include plenty of vegetables, fruits, low-fat dairy products, and lean protein. ? Avoid eating foods that are high in solid fats, added sugars, or salt (sodium).  Get regular exercise. This is one of the most important things that you can do for your health. ? Try to exercise for at least 150 minutes each week. The type of exercise that you do should increase your heart rate and make you sweat. This is known as moderate-intensity exercise. ? Try to do strengthening exercises at least twice each week. Do these in addition to the moderate-intensity exercise.  Know your numbers.Ask your health care provider to check your cholesterol and your blood glucose. Continue to have your blood tested as directed by your health care provider.  What should I know about cancer screening? There are several types of cancer. Take the following steps to reduce your risk and to catch any cancer development as early as possible. Breast Cancer  Practice breast self-awareness. ? This means understanding how your breasts normally appear and feel. ? It also means doing regular breast self-exams. Let your health care provider know about any changes, no matter how small.  If you are 40  or older, have a clinician do a breast exam (clinical breast exam or CBE) every year. Depending on your age, family history, and medical history, it may be recommended that you also have a yearly breast X-ray (mammogram).  If you  have a family history of breast cancer, talk with your health care provider about genetic screening.  If you are at high risk for breast cancer, talk with your health care provider about having an MRI and a mammogram every year.  Breast cancer (BRCA) gene test is recommended for women who have family members with BRCA-related cancers. Results of the assessment will determine the need for genetic counseling and BRCA1 and for BRCA2 testing. BRCA-related cancers include these types: ? Breast. This occurs in males or females. ? Ovarian. ? Tubal. This may also be called fallopian tube cancer. ? Cancer of the abdominal or pelvic lining (peritoneal cancer). ? Prostate. ? Pancreatic.  Cervical, Uterine, and Ovarian Cancer Your health care provider may recommend that you be screened regularly for cancer of the pelvic organs. These include your ovaries, uterus, and vagina. This screening involves a pelvic exam, which includes checking for microscopic changes to the surface of your cervix (Pap test).  For women ages 21-65, health care providers may recommend a pelvic exam and a Pap test every three years. For women ages 79-65, they may recommend the Pap test and pelvic exam, combined with testing for human papilloma virus (HPV), every five years. Some types of HPV increase your risk of cervical cancer. Testing for HPV may also be done on women of any age who have unclear Pap test results.  Other health care providers may not recommend any screening for nonpregnant women who are considered low risk for pelvic cancer and have no symptoms. Ask your health care provider if a screening pelvic exam is right for you.  If you have had past treatment for cervical cancer or a condition that could lead to cancer, you need Pap tests and screening for cancer for at least 20 years after your treatment. If Pap tests have been discontinued for you, your risk factors (such as having a new sexual partner) need to be  reassessed to determine if you should start having screenings again. Some women have medical problems that increase the chance of getting cervical cancer. In these cases, your health care provider may recommend that you have screening and Pap tests more often.  If you have a family history of uterine cancer or ovarian cancer, talk with your health care provider about genetic screening.  If you have vaginal bleeding after reaching menopause, tell your health care provider.  There are currently no reliable tests available to screen for ovarian cancer.  Lung Cancer Lung cancer screening is recommended for adults 69-62 years old who are at high risk for lung cancer because of a history of smoking. A yearly low-dose CT scan of the lungs is recommended if you:  Currently smoke.  Have a history of at least 30 pack-years of smoking and you currently smoke or have quit within the past 15 years. A pack-year is smoking an average of one pack of cigarettes per day for one year.  Yearly screening should:  Continue until it has been 15 years since you quit.  Stop if you develop a health problem that would prevent you from having lung cancer treatment.  Colorectal Cancer  This type of cancer can be detected and can often be prevented.  Routine colorectal cancer screening usually begins at  age 42 and continues through age 45.  If you have risk factors for colon cancer, your health care provider may recommend that you be screened at an earlier age.  If you have a family history of colorectal cancer, talk with your health care provider about genetic screening.  Your health care provider may also recommend using home test kits to check for hidden blood in your stool.  A small camera at the end of a tube can be used to examine your colon directly (sigmoidoscopy or colonoscopy). This is done to check for the earliest forms of colorectal cancer.  Direct examination of the colon should be repeated every  5-10 years until age 71. However, if early forms of precancerous polyps or small growths are found or if you have a family history or genetic risk for colorectal cancer, you may need to be screened more often.  Skin Cancer  Check your skin from head to toe regularly.  Monitor any moles. Be sure to tell your health care provider: ? About any new moles or changes in moles, especially if there is a change in a mole's shape or color. ? If you have a mole that is larger than the size of a pencil eraser.  If any of your family members has a history of skin cancer, especially at a young age, talk with your health care provider about genetic screening.  Always use sunscreen. Apply sunscreen liberally and repeatedly throughout the day.  Whenever you are outside, protect yourself by wearing long sleeves, pants, a wide-brimmed hat, and sunglasses.  What should I know about osteoporosis? Osteoporosis is a condition in which bone destruction happens more quickly than new bone creation. After menopause, you may be at an increased risk for osteoporosis. To help prevent osteoporosis or the bone fractures that can happen because of osteoporosis, the following is recommended:  If you are 46-71 years old, get at least 1,000 mg of calcium and at least 600 mg of vitamin D per day.  If you are older than age 55 but younger than age 65, get at least 1,200 mg of calcium and at least 600 mg of vitamin D per day.  If you are older than age 54, get at least 1,200 mg of calcium and at least 800 mg of vitamin D per day.  Smoking and excessive alcohol intake increase the risk of osteoporosis. Eat foods that are rich in calcium and vitamin D, and do weight-bearing exercises several times each week as directed by your health care provider. What should I know about how menopause affects my mental health? Depression may occur at any age, but it is more common as you become older. Common symptoms of depression  include:  Low or sad mood.  Changes in sleep patterns.  Changes in appetite or eating patterns.  Feeling an overall lack of motivation or enjoyment of activities that you previously enjoyed.  Frequent crying spells.  Talk with your health care provider if you think that you are experiencing depression. What should I know about immunizations? It is important that you get and maintain your immunizations. These include:  Tetanus, diphtheria, and pertussis (Tdap) booster vaccine.  Influenza every year before the flu season begins.  Pneumonia vaccine.  Shingles vaccine.  Your health care provider may also recommend other immunizations. This information is not intended to replace advice given to you by your health care provider. Make sure you discuss any questions you have with your health care provider. Document Released: 09/01/2005  Document Revised: 01/28/2016 Document Reviewed: 04/13/2015 Elsevier Interactive Patient Education  2018 [employer]  

## 2017-12-13 NOTE — Telephone Encounter (Signed)
Notified Isha w/MD response.Marland KitchenJohny Chess

## 2017-12-13 NOTE — Addendum Note (Signed)
Addended by: Karren Cobble on: 12/13/2017 11:28 AM   Modules accepted: Orders

## 2017-12-13 NOTE — Assessment & Plan Note (Addendum)
F/u w/Dr Sherrill/labs - pending

## 2017-12-13 NOTE — Assessment & Plan Note (Signed)
B12 inj today 

## 2017-12-13 NOTE — Assessment & Plan Note (Signed)
Triamc oint prn Wet wipes Urol ref

## 2017-12-16 ENCOUNTER — Observation Stay (HOSPITAL_COMMUNITY)
Admission: EM | Admit: 2017-12-16 | Discharge: 2017-12-20 | Disposition: A | Discharge status: Skilled Nursing Facility | Payer: Medicare Other | Attending: Internal Medicine | Admitting: Internal Medicine

## 2017-12-16 ENCOUNTER — Other Ambulatory Visit: Payer: Self-pay

## 2017-12-16 ENCOUNTER — Encounter (HOSPITAL_COMMUNITY): Payer: Self-pay

## 2017-12-16 ENCOUNTER — Emergency Department (HOSPITAL_COMMUNITY): Payer: Medicare Other

## 2017-12-16 DIAGNOSIS — G629 Polyneuropathy, unspecified: Secondary | ICD-10-CM | POA: Diagnosis not present

## 2017-12-16 DIAGNOSIS — R4182 Altered mental status, unspecified: Secondary | ICD-10-CM | POA: Diagnosis present

## 2017-12-16 DIAGNOSIS — Z79890 Hormone replacement therapy (postmenopausal): Secondary | ICD-10-CM | POA: Diagnosis not present

## 2017-12-16 DIAGNOSIS — F0280 Dementia in other diseases classified elsewhere without behavioral disturbance: Secondary | ICD-10-CM | POA: Insufficient documentation

## 2017-12-16 DIAGNOSIS — IMO0002 Reserved for concepts with insufficient information to code with codable children: Secondary | ICD-10-CM

## 2017-12-16 DIAGNOSIS — G9341 Metabolic encephalopathy: Secondary | ICD-10-CM | POA: Diagnosis not present

## 2017-12-16 DIAGNOSIS — N3000 Acute cystitis without hematuria: Secondary | ICD-10-CM

## 2017-12-16 DIAGNOSIS — E538 Deficiency of other specified B group vitamins: Secondary | ICD-10-CM | POA: Diagnosis not present

## 2017-12-16 DIAGNOSIS — E785 Hyperlipidemia, unspecified: Secondary | ICD-10-CM | POA: Diagnosis not present

## 2017-12-16 DIAGNOSIS — Z79899 Other long term (current) drug therapy: Secondary | ICD-10-CM | POA: Insufficient documentation

## 2017-12-16 DIAGNOSIS — F419 Anxiety disorder, unspecified: Secondary | ICD-10-CM | POA: Diagnosis not present

## 2017-12-16 DIAGNOSIS — Z856 Personal history of leukemia: Secondary | ICD-10-CM | POA: Insufficient documentation

## 2017-12-16 DIAGNOSIS — K5900 Constipation, unspecified: Secondary | ICD-10-CM | POA: Insufficient documentation

## 2017-12-16 DIAGNOSIS — Z66 Do not resuscitate: Secondary | ICD-10-CM | POA: Diagnosis not present

## 2017-12-16 DIAGNOSIS — R531 Weakness: Secondary | ICD-10-CM | POA: Diagnosis present

## 2017-12-16 DIAGNOSIS — Z7982 Long term (current) use of aspirin: Secondary | ICD-10-CM | POA: Insufficient documentation

## 2017-12-16 DIAGNOSIS — N23 Unspecified renal colic: Secondary | ICD-10-CM

## 2017-12-16 DIAGNOSIS — N390 Urinary tract infection, site not specified: Secondary | ICD-10-CM | POA: Diagnosis not present

## 2017-12-16 DIAGNOSIS — G309 Alzheimer's disease, unspecified: Secondary | ICD-10-CM | POA: Diagnosis not present

## 2017-12-16 DIAGNOSIS — N3281 Overactive bladder: Secondary | ICD-10-CM | POA: Insufficient documentation

## 2017-12-16 DIAGNOSIS — Z87440 Personal history of urinary (tract) infections: Secondary | ICD-10-CM

## 2017-12-16 DIAGNOSIS — H409 Unspecified glaucoma: Secondary | ICD-10-CM | POA: Insufficient documentation

## 2017-12-16 DIAGNOSIS — I10 Essential (primary) hypertension: Secondary | ICD-10-CM | POA: Diagnosis not present

## 2017-12-16 DIAGNOSIS — K219 Gastro-esophageal reflux disease without esophagitis: Secondary | ICD-10-CM | POA: Diagnosis not present

## 2017-12-16 DIAGNOSIS — Z7951 Long term (current) use of inhaled steroids: Secondary | ICD-10-CM | POA: Diagnosis not present

## 2017-12-16 DIAGNOSIS — R102 Pelvic and perineal pain: Secondary | ICD-10-CM

## 2017-12-16 LAB — CBC
HEMATOCRIT: 41.7 % (ref 36.0–46.0)
Hemoglobin: 14.7 g/dL (ref 12.0–15.0)
MCH: 31.9 pg (ref 26.0–34.0)
MCHC: 35.3 g/dL (ref 30.0–36.0)
MCV: 90.5 fL (ref 78.0–100.0)
Platelets: 189 10*3/uL (ref 150–400)
RBC: 4.61 MIL/uL (ref 3.87–5.11)
RDW: 13.2 % (ref 11.5–15.5)
WBC: 5.3 10*3/uL (ref 4.0–10.5)

## 2017-12-16 LAB — BASIC METABOLIC PANEL
ANION GAP: 11 (ref 5–15)
ANION GAP: 11 (ref 5–15)
BUN: 14 mg/dL (ref 6–20)
BUN: 13 mg/dL (ref 6–20)
CALCIUM: 9.2 mg/dL (ref 8.9–10.3)
CO2: 28 mmol/L (ref 22–32)
CO2: 27 mmol/L (ref 22–32)
Calcium: 9 mg/dL (ref 8.9–10.3)
Chloride: 93 mmol/L — ABNORMAL LOW (ref 101–111)
Chloride: 96 mmol/L — ABNORMAL LOW (ref 101–111)
Creatinine, Ser: 0.66 mg/dL (ref 0.44–1.00)
Creatinine, Ser: 0.7 mg/dL (ref 0.44–1.00)
GFR calc Af Amer: >60 mL/min (ref >60)
GFR calc non Af Amer: >60 mL/min (ref >60)
Glucose, Bld: 108 mg/dL — ABNORMAL HIGH (ref 65–99)
Glucose, Bld: 146 mg/dL — ABNORMAL HIGH (ref 65–99)
POTASSIUM: 3.8 mmol/L (ref 3.5–5.1)
POTASSIUM: 3.6 mmol/L (ref 3.5–5.1)
Sodium: 132 mmol/L — ABNORMAL LOW (ref 135–145)
Sodium: 134 mmol/L — ABNORMAL LOW (ref 135–145)

## 2017-12-16 LAB — URINALYSIS, ROUTINE W REFLEX MICROSCOPIC
BILIRUBIN URINE: NEGATIVE
GLUCOSE, UA: NEGATIVE mg/dL
KETONES UR: NEGATIVE mg/dL
NITRITE: POSITIVE — AB
PH: 7 (ref 5.0–8.0)
Protein, ur: NEGATIVE mg/dL
Specific Gravity, Urine: 1.01 (ref 1.005–1.030)

## 2017-12-16 LAB — I-STAT CG4 LACTIC ACID, ED: LACTIC ACID, VENOUS: 0.89 mmol/L (ref 0.5–1.9)

## 2017-12-16 LAB — CBG MONITORING, ED: Glucose-Capillary: 109 mg/dL — ABNORMAL HIGH (ref 65–99)

## 2017-12-16 MED ORDER — VITAMIN D3 50 MCG (2000 UT) PO CAPS: 2000.0000 [IU] | ORAL_CAPSULE | Freq: Every day | ORAL | Status: DC

## 2017-12-16 MED ORDER — LATANOPROST 0.005 % OP SOLN
1.0000 [drp] | Freq: Every day | OPHTHALMIC | Status: DC
  Administered 2017-12-16 – 2017-12-19 (×4): 1 [drp] via OPHTHALMIC
  Filled 2017-12-16 (×2): qty 2.5

## 2017-12-16 MED ORDER — VITAMIN D3 25 MCG (1000 UNIT) PO TABS
2000.0000 [IU] | ORAL_TABLET | Freq: Every day | ORAL | Status: DC
  Administered 2017-12-17 – 2017-12-20 (×4): 2000 [IU] via ORAL
  Filled 2017-12-16 (×4): qty 2

## 2017-12-16 MED ORDER — SODIUM CHLORIDE 0.9 % IV SOLN
1.0000 g | INTRAVENOUS | Status: DC
Start: 2017-12-17 — End: 2017-12-19
  Administered 2017-12-17 – 2017-12-18 (×2): 1 g via INTRAVENOUS
  Filled 2017-12-16 (×2): qty 1

## 2017-12-16 MED ORDER — MIRABEGRON ER 25 MG PO TB24
50.0000 mg | ORAL_TABLET | Freq: Every day | ORAL | Status: DC
  Administered 2017-12-17 – 2017-12-20 (×4): 50 mg via ORAL
  Filled 2017-12-16 (×4): qty 2

## 2017-12-16 MED ORDER — ROSUVASTATIN CALCIUM 5 MG PO TABS
5.0000 mg | ORAL_TABLET | Freq: Every day | ORAL | Status: DC
  Administered 2017-12-17 – 2017-12-20 (×4): 5 mg via ORAL
  Filled 2017-12-16 (×4): qty 1

## 2017-12-16 MED ORDER — ASPIRIN EC 81 MG PO TBEC
81.0000 mg | DELAYED_RELEASE_TABLET | Freq: Every day | ORAL | Status: DC
  Administered 2017-12-17 – 2017-12-20 (×4): 81 mg via ORAL
  Filled 2017-12-16 (×4): qty 1

## 2017-12-16 MED ORDER — FOLIC ACID 1 MG PO TABS
1.0000 mg | ORAL_TABLET | Freq: Every day | ORAL | Status: DC
  Administered 2017-12-17 – 2017-12-20 (×4): 1 mg via ORAL
  Filled 2017-12-16 (×4): qty 1

## 2017-12-16 MED ORDER — SODIUM CHLORIDE 0.9% FLUSH
3.0000 mL | Freq: Two times a day (BID) | INTRAVENOUS | Status: DC
  Administered 2017-12-16 – 2017-12-18 (×5): 3 mL via INTRAVENOUS

## 2017-12-16 MED ORDER — SODIUM CHLORIDE 0.9% FLUSH: 3.0000 mL | INTRAVENOUS | Status: DC | PRN

## 2017-12-16 MED ORDER — SODIUM CHLORIDE 0.9 % IV SOLN: 250.0000 mL | INTRAVENOUS | Status: DC | PRN

## 2017-12-16 MED ORDER — SUCRALFATE 1 G PO TABS
1.0000 g | ORAL_TABLET | Freq: Three times a day (TID) | ORAL | Status: DC
  Administered 2017-12-16 – 2017-12-20 (×15): 1 g via ORAL
  Filled 2017-12-16 (×15): qty 1

## 2017-12-16 MED ORDER — MEMANTINE HCL ER 28 MG PO CP24
28.0000 mg | ORAL_CAPSULE | Freq: Every day | ORAL | Status: DC
  Administered 2017-12-16 – 2017-12-19 (×4): 28 mg via ORAL
  Filled 2017-12-16 (×5): qty 1

## 2017-12-16 MED ORDER — FLUTICASONE PROPIONATE 50 MCG/ACT NA SUSP
2.0000 | Freq: Every day | NASAL | Status: DC
  Administered 2017-12-17 – 2017-12-20 (×4): 2 via NASAL
  Filled 2017-12-16: qty 16

## 2017-12-16 MED ORDER — SODIUM CHLORIDE 0.9 % IV SOLN
1.0000 g | Freq: Once | INTRAVENOUS | Status: AC
  Administered 2017-12-16: 1 g via INTRAVENOUS
  Filled 2017-12-16: qty 10

## 2017-12-16 MED ORDER — GABAPENTIN 300 MG PO CAPS
300.0000 mg | ORAL_CAPSULE | Freq: Every day | ORAL | Status: DC
  Administered 2017-12-17 – 2017-12-20 (×4): 300 mg via ORAL
  Filled 2017-12-16 (×5): qty 1

## 2017-12-16 MED ORDER — TRAMADOL HCL 50 MG PO TABS
50.0000 mg | ORAL_TABLET | Freq: Four times a day (QID) | ORAL | Status: DC | PRN
  Administered 2017-12-16 – 2017-12-18 (×3): 50 mg via ORAL
  Filled 2017-12-16 (×3): qty 1

## 2017-12-16 MED ORDER — SODIUM CHLORIDE 0.9 % IV BOLUS
500.0000 mL | Freq: Once | INTRAVENOUS | Status: AC
  Administered 2017-12-16: 500 mL via INTRAVENOUS

## 2017-12-16 MED ORDER — IOPAMIDOL (ISOVUE-300) INJECTION 61%
100.0000 mL | Freq: Once | INTRAVENOUS | Status: AC | PRN
  Administered 2017-12-16: 100 mL via INTRAVENOUS

## 2017-12-16 MED ORDER — HEPARIN SODIUM (PORCINE) 5000 UNIT/ML IJ SOLN
5000.0000 [IU] | Freq: Three times a day (TID) | INTRAMUSCULAR | Status: DC
  Administered 2017-12-16 – 2017-12-20 (×12): 5000 [IU] via SUBCUTANEOUS
  Filled 2017-12-16 (×12): qty 1

## 2017-12-16 MED ORDER — ACETAMINOPHEN 500 MG PO TABS
500.0000 mg | ORAL_TABLET | Freq: Two times a day (BID) | ORAL | Status: DC
  Administered 2017-12-16 – 2017-12-20 (×8): 500 mg via ORAL
  Filled 2017-12-16 (×8): qty 1

## 2017-12-16 MED ORDER — PANTOPRAZOLE SODIUM 40 MG PO TBEC
40.0000 mg | DELAYED_RELEASE_TABLET | Freq: Every day | ORAL | Status: DC
  Administered 2017-12-17 – 2017-12-20 (×4): 40 mg via ORAL
  Filled 2017-12-16 (×4): qty 1

## 2017-12-16 MED ORDER — ASPIRIN 81 MG PO TBEC: 81.0000 mg | DELAYED_RELEASE_TABLET | Freq: Every day | ORAL | Status: DC

## 2017-12-16 MED ORDER — DONEPEZIL HCL 10 MG PO TABS
10.0000 mg | ORAL_TABLET | Freq: Every day | ORAL | Status: DC
  Administered 2017-12-16 – 2017-12-19 (×4): 10 mg via ORAL
  Filled 2017-12-16 (×4): qty 1

## 2017-12-16 MED ORDER — MEMANTINE HCL-DONEPEZIL HCL ER 28-10 MG PO CP24: 1.0000 | ORAL_CAPSULE | Freq: Every day | ORAL | Status: DC

## 2017-12-16 NOTE — ED Triage Notes (Signed)
Patient comes from home. Patient brought in via [facility]. Pt son called due to patient being weak and lethargic. Patient was recently treated for UTI 2-3 weeks ago and has also recently finished antibiotics for UTI. Pt is currently having pain in suprapubic / vaginal area that is 8-10 pain. Pt is AOx4, on room air and normally ambulatory using a walker at times. Pt requests to be DNR.

## 2017-12-16 NOTE — ED Notes (Signed)
Pt informed NT when entering the room that she wanted to sign the paperwork for a DNR. Pt stated that "if God wants to take me then let him take me. I am 84 years old and ready to go when he wants me. I am tired."

## 2017-12-16 NOTE — H&P (Signed)
History and Physical    ACHAIA GARLOCK WUJ:[id number] DOB: 05/09/1926 DOA: 12/16/2017  PCP: Cassandria Anger, MD  Patient coming from: home  Chief Complaint: Increase urgency, frequency, dysuria  HPI: [name] is a 84 y.o. female with medical history significant of with 3-day complaint of above problems.  Reportedly recently treated with Keflex by primary care physician for UTI.  Patient presenting with these problems.  History is obtained from son and ER physician as patient is unable to provide history secondary to dementia.  The problem has been persistent is not getting any better.  The problem has been gradually getting worse.  ED Course: The patient was found to have urinalysis suspicious for UTI.  Given presenting problems and confusion we were consulted for further evaluation recommendations.  Review of Systems: unable to assess due to dementia  Past Medical History:  Diagnosis Date  . Allergy    rhinitis  . Chronic ethmoidal sinusitis   . Diverticulosis   . Diverticulosis of colon   . GERD (gastroesophageal reflux disease)   . Glaucoma   . Hx of colonic polyps   . Hyperlipidemia   . Hypertension   . Hypogammaglobulinemia ([place])    Monthly IVIG  . Leukemia ([place])    Chronic lymphocytic leukemia  . Osteoarthritis   . Peripheral neuropathy   . Pernicious anemia   . UTI (lower urinary tract infection) 2010  . Vitamin B12 deficiency     Past Surgical History:  Procedure Laterality Date  . ABDOMINAL HYSTERECTOMY  1987  . bilateral cataracts  2007  . bladder tack  1987  . L4-L5 Laminetomy  1999  . TUBAL LIGATION    . tubaligation  1961  . ureter reconstruction  1992     reports that she has never smoked. She has never used smokeless tobacco. She reports that she does not drink alcohol or use drugs.  Allergies  Allergen Reactions  . Pneumococcal Vaccines Swelling and Rash    Pneumococcal Vaccine-23 valent  . Clarithromycin Other (See Comments)   REACTION: sore mouth  . Hctz [Hydrochlorothiazide]     Low Na  . Oxycodone-Aspirin Other (See Comments)    REACTION: horrible nightmares  . Propoxyphene N-Acetaminophen Other (See Comments)    REACTION: couldn't wake her up    Family History  Problem Relation Age of Onset  . Jaundice Father   . Stroke Son 98       in NH  . Cancer Mother        uterine  . Diabetes Mother   . Other Sister        brain tumor  . Cancer Sister   . Hypertension Other   . Cancer Other        breast  . Thyroid disease Sister   . Cancer Sister        thyroid cancer    Prior to Admission medications   Medication Sig Start Date End Date Taking? Authorizing Provider  acetaminophen (TYLENOL) 500 MG tablet Take 500 mg by mouth 2 (two) times daily.    Yes [provider]  aspirin 81 MG EC tablet Take 81 mg by mouth daily.     Yes [provider]  Bioflavonoid Products (GRAPE SEED PO) Take 1 tablet by mouth daily.   Yes [provider]  cephALEXin (KEFLEX) 500 MG capsule Take 1 capsule (500 mg total) by mouth daily. 11/20/17  Yes Plotnikov, Evie Lacks, MD  Cholecalciferol (VITAMIN D3) 2000 units capsule Take  1 capsule (2,000 Units total) by mouth daily. 11/23/16  Yes Plotnikov, Evie Lacks, MD  clotrimazole-betamethasone (LOTRISONE) cream Apply 1 application topically 2 (two) times daily. 11/20/17 11/20/18 Yes Plotnikov, Evie Lacks, MD  conjugated estrogens (PREMARIN) vaginal cream 1/2 GRAM VAGINALLY EVERY NIGHT X 1 WEEK, THEN TWICE WEEKLY AT BEDTIME 11/11/17  Yes Plotnikov, Evie Lacks, MD  cromolyn (OPTICROM) 4 % ophthalmic solution 1 DROP IN BOTH EYES FOUR TIMES A DAY 03/21/16  Yes [provider]  cyanocobalamin (,VITAMIN B-12,) 1000 MCG/ML injection ADMINISTER 1 CC SUBQ EVERY 4 WEEKS AS DIRECTED 08/07/16  Yes Plotnikov, Evie Lacks, MD  Dexlansoprazole (DEXILANT) 30 MG capsule Take 1 capsule (30 mg total) by mouth daily. 07/25/17 12/16/17 Yes Plotnikov, Evie Lacks, MD  fluconazole  (DIFLUCAN) 150 MG tablet TAKE 1 TABLET (150 MG TOTAL) BY MOUTH ONCE FOR 1 DOSE. REPEAT IN 1 WEEK IF NEEDED. 12/13/17  Yes [provider]  fluticasone (FLONASE) 50 MCG/ACT nasal spray Place 2 sprays into both nostrils daily. 11/21/16  Yes Plotnikov, Evie Lacks, MD  folic acid (FOLVITE) 1 MG tablet Take 1 mg by mouth daily. For restless legs   Yes [provider]  gabapentin (NEURONTIN) 300 MG capsule TAKE 1 CAPSULE (300 MG TOTAL) BY MOUTH DAILY. 11/28/17  Yes Plotnikov, Evie Lacks, MD  GINSENG PO Take 1 tablet by mouth daily.   Yes [provider]  hydrochlorothiazide (MICROZIDE) 12.5 MG capsule Take 1 capsule (12.5 mg total) by mouth daily. For blood pressure 07/12/17  Yes Plotnikov, Evie Lacks, MD  KLOR-CON M10 10 MEQ tablet TAKE 1 TABLET (10 MEQ TOTAL) BY MOUTH DAILY. 11/05/17  Yes Plotnikov, Evie Lacks, MD  latanoprost (XALATAN) 0.005 % ophthalmic solution PLACE 1 DROP INTO BOTH EYES AT BEDTIME. 11/11/17  Yes Plotnikov, Evie Lacks, MD  loratadine (CLARITIN) 10 MG tablet TAKE 1 TABLET (10 MG TOTAL) BY MOUTH DAILY. 11/19/17  Yes Plotnikov, Evie Lacks, MD  magic mouthwash w/lidocaine SOLN Take 5 mLs by mouth 3 (three) times daily as needed for mouth pain. Swish, hold, spit 12/13/17  Yes Plotnikov, Evie Lacks, MD  MAGNESIUM CHLORIDE PO Take 1 tablet by mouth daily.   Yes [provider]  Memantine HCl-Donepezil HCl 28-10 MG CP24 Take 1 capsule by mouth daily. 12/13/17  Yes Plotnikov, Evie Lacks, MD  mirabegron ER (MYRBETRIQ) 50 MG TB24 tablet Take 1 tablet (50 mg total) by mouth daily. 11/20/17  Yes Plotnikov, Evie Lacks, MD  Multiple Vitamins-Minerals (CENTRUM SILVER 50+WOMEN) TABS Take 1 tablet by mouth daily.    Yes [provider]  RABEprazole (ACIPHEX) 20 MG tablet Take 1 tablet (20 mg total) by mouth 2 (two) times daily. 10/07/17  Yes Plotnikov, Evie Lacks, MD  sucralfate (CARAFATE) 1 g tablet Take 1 tablet (1 g total) by mouth 4 (four) times daily -  with meals and at  bedtime. 08/07/17  Yes Plotnikov, Evie Lacks, MD  triamcinolone ointment (KENALOG) 0.1 % Apply 1 application topically 2 (two) times daily. 12/13/17  Yes Plotnikov, Evie Lacks, MD  B-D INS SYRINGE 0.5CC/30GX1/2" 30G X 1/2" 0.5 ML MISC USE TO ADMINISTER B12 INJECTIONS 06/13/17   Plotnikov, Evie Lacks, MD  Insulin Syringe-Needle U-100 (BD INSULIN SYRINGE U/F) 30G X 1/2" 0.5 ML MISC USE TO ADMINISTER B12 INJECTIONS 06/13/17   [provider]  rosuvastatin (CRESTOR) 5 MG tablet Take 1 tablet (5 mg total) by mouth daily. 08/21/17   Plotnikov, Evie Lacks, MD    Physical Exam: Vitals:   12/16/17 1349 12/16/17 1358 12/16/17  1430 12/16/17 1515  BP: (!) 157/71 (!) 157/71 (!) 146/69 (!) 154/76  Pulse: 62 64 60 68  Resp: 19 15 12 19   Temp:  97.8 F (36.6 C)    TempSrc: Oral Oral    SpO2: 96% 96% 94% 97%  Weight:      Height:        Constitutional: NAD, calm, comfortable Vitals:   12/16/17 1349 12/16/17 1358 12/16/17 1430 12/16/17 1515  BP: (!) 157/71 (!) 157/71 (!) 146/69 (!) 154/76  Pulse: 62 64 60 68  Resp: 19 15 12 19   Temp:  97.8 F (36.6 C)    TempSrc: Oral Oral    SpO2: 96% 96% 94% 97%  Weight:      Height:       Eyes: PERRL, lids and conjunctivae normal ENMT: Mucous membranes are moist. Posterior pharynx clear of any exudate or lesions Neck: normal, supple, no masses, no thyromegaly Respiratory: clear to auscultation bilaterally, no wheezing, no crackles. Normal respiratory effort. No accessory muscle use.  Cardiovascular: Regular rate and rhythm, no murmurs / rubs / gallops.  Abdomen: no tenderness, no masses palpated. No hepatosplenomegaly. Bowel sounds positive.  Positive suprapubic discomfort Musculoskeletal: no clubbing / cyanosis. No joint deformity upper and lower extremities. Good ROM, no contractures. Normal muscle tone.  Skin: no rashes, lesions, ulcers. No induration, on limited exam Neurologic: No facial asymmetry, moves extremities equally, sensation light touch  intact Psychiatric: Normal judgment and insight impaired.  Normal mood.     Labs on Admission: I have personally reviewed following labs and imaging studies  CBC: Recent Labs  Lab 12/16/17 1418  WBC 5.3  HGB 14.7  HCT 41.7  MCV 90.5  PLT 353   Basic Metabolic Panel: Recent Labs  Lab 12/16/17 1418  NA 132*  K 3.8  CL 93*  CO2 28  GLUCOSE 108*  BUN 14  CREATININE 0.66  CALCIUM 9.2   GFR: Estimated Creatinine Clearance: 41.2 mL/min (by C-G formula based on SCr of 0.66 mg/dL). Liver Function Tests: No results for input(s): AST, ALT, ALKPHOS, BILITOT, PROT, ALBUMIN in the last 168 hours. No results for input(s): LIPASE, AMYLASE in the last 168 hours. No results for input(s): AMMONIA in the last 168 hours. Coagulation Profile: No results for input(s): INR, PROTIME in the last 168 hours. Cardiac Enzymes: No results for input(s): CKTOTAL, CKMB, CKMBINDEX, TROPONINI in the last 168 hours. BNP (last 3 results) No results for input(s): PROBNP in the last 8760 hours. HbA1C: No results for input(s): HGBA1C in the last 72 hours. CBG: Recent Labs  Lab 12/16/17 1357  GLUCAP 109*   Lipid Profile: No results for input(s): CHOL, HDL, LDLCALC, TRIG, CHOLHDL, LDLDIRECT in the last 72 hours. Thyroid Function Tests: No results for input(s): TSH, T4TOTAL, FREET4, T3FREE, THYROIDAB in the last 72 hours. Anemia Panel: No results for input(s): VITAMINB12, FOLATE, FERRITIN, TIBC, IRON, RETICCTPCT in the last 72 hours. Urine analysis:    Component Value Date/Time   COLORURINE AMBER (A) 12/16/2017 1633   APPEARANCEUR CLEAR 12/16/2017 1633   LABSPEC 1.010 12/16/2017 1633   LABSPEC 1.010 04/03/2017 0950   PHURINE 7.0 12/16/2017 [place] 12/16/2017 1633   GLUCOSEU NEGATIVE 09/25/2017 1030   GLUCOSEU Negative 04/03/2017 0950   HGBUR SMALL (A) 12/16/2017 1633   HGBUR trace-intact 07/07/2008 1058   BILIRUBINUR NEGATIVE 12/16/2017 1633   BILIRUBINUR negative  11/16/2017 1653   BILIRUBINUR neg 11/02/2017 1524   BILIRUBINUR Negative 04/03/2017 [place] 12/16/2017  [place] 12/16/2017 1633   UROBILINOGEN 0.2 11/16/2017 1653   UROBILINOGEN 0.2 09/25/2017 1030   UROBILINOGEN 0.2 04/03/2017 0950   NITRITE POSITIVE (A) 12/16/2017 1633   LEUKOCYTESUR LARGE (A) 12/16/2017 1633   LEUKOCYTESUR Trace 04/03/2017 0950    Radiological Exams on Admission: Ct Abdomen Pelvis W Contrast  Result Date: 12/16/2017 CLINICAL DATA:  Weakness, lethargy, previous UTI,  suprapubic pain EXAM: CT ABDOMEN AND PELVIS WITH CONTRAST TECHNIQUE: Multidetector CT imaging of the abdomen and pelvis was performed using the standard protocol following bolus administration of intravenous contrast. CONTRAST:  169mL ISOVUE-300 IOPAMIDOL (ISOVUE-300) INJECTION 61% COMPARISON:  08/24/2016 FINDINGS: Lower chest: Scattered coronary calcifications. No pleural or pericardial effusion. Visualized lung bases clear. Hepatobiliary: 3cm low attenuation collection at the posterior margin of the right hepatic lobe, previously 2.7 cm. No new liver lesion. Gallbladder is nondistended. No biliary ductal dilatation. Pancreas: Unremarkable. No pancreatic ductal dilatation or surrounding inflammatory changes. Spleen: Normal in size without focal abnormality. Adrenals/Urinary Tract: Normal adrenal glands. Normal renal parenchymal enhancement. No hydronephrosis. There is mild right ureterectasis down to the ureteral orifice, without radiodense calculus. The urinary bladder is physiologically distended. Stomach/Bowel: Stomach is decompressed. Small bowel nondilated. Normal appendix. Moderate proximal colonic fecal material, decompressed distally. Vascular/Lymphatic: Scattered aortoiliac calcified plaque without aneurysm or stenosis. Portal vein patent. Bilateral pelvic phleboliths. No abdominal or pelvic adenopathy. Reproductive: Status post hysterectomy. No adnexal masses. Other: No  ascites.  No free air. Musculoskeletal: Spondylitic changes in the lower thoracic and lumbar spine. Thoracolumbar dextroscoliosis apex L2. Negative for fracture or worrisome bone lesion. IMPRESSION: 1. No acute findings. 2. Stable right ureterectasis without hydronephrosis or urolithiasis. 3. Coronary and aortoiliac  atherosclerosis (ICD10-170.0) Electronically Signed   By: Lucrezia Europe M.D.   On: 12/16/2017 16:03     Assessment/Plan Active Problems:   Urinary tract infection without hematuria -Continue antibiotic therapy -We will place order for urine culture    Altered mental status -Secondary to infectious etiology I suspect should clear up with improvement and resolution of infection.    GERD (gastroesophageal reflux disease) -We will place patient on Protonix while here    Alzheimer disease -Stable continue home medication regimen    Anxiety - stable  DVT prophylaxis: heparin Code Status:Full Family Communication: none at bedside Disposition Plan: pending improvement in condition Consults called:none Admission status: obs   Velvet Bathe MD [facility] Pager [phone]  If 7PM-7AM, please contact night-coverage [email or web address] Password [facility]  12/16/2017, 5:38 PM

## 2017-12-16 NOTE — ED Notes (Signed)
ED TO INPATIENT HANDOFF REPORT  Name/Age/Gender [name] 84 y.o. female  Code Status Code Status History    Date Active Date Inactive Code Status Order ID Comments User Context   05/10/2016 0211 05/11/2016 1754 Full Code [id number]  Lily Kocher, MD Inpatient   02/08/2015 0323 02/09/2015 2031 Full Code [id number]  Rise Patience, MD Inpatient      Home/SNF/Other Home  Chief Complaint Weakness  Level of Care/Admitting Diagnosis ED Disposition    ED Disposition Condition [facility]: [facility] [[id number]]  Level of Care: Med-Surg [16]  Diagnosis: Altered mental status [780.97.ICD-9-CM]  Admitting Physician: Velvet Bathe [[id number]]  Attending Physician: Velvet Bathe [[id number]]  PT Class (Do Not Modify): Observation [104]  PT Acc Code (Do Not Modify): Observation [10022]       Medical History Past Medical History:  Diagnosis Date  . Allergy    rhinitis  . Chronic ethmoidal sinusitis   . Diverticulosis   . Diverticulosis of colon   . GERD (gastroesophageal reflux disease)   . Glaucoma   . Hx of colonic polyps   . Hyperlipidemia   . Hypertension   . Hypogammaglobulinemia ([place])    Monthly IVIG  . Leukemia ([place])    Chronic lymphocytic leukemia  . Osteoarthritis   . Peripheral neuropathy   . Pernicious anemia   . UTI (lower urinary tract infection) 2010  . Vitamin B12 deficiency     Allergies Allergies  Allergen Reactions  . Pneumococcal Vaccines Swelling and Rash    Pneumococcal Vaccine-23 valent  . Clarithromycin Other (See Comments)    REACTION: sore mouth  . Hctz [Hydrochlorothiazide]     Low Na  . Oxycodone-Aspirin Other (See Comments)    REACTION: horrible nightmares  . Propoxyphene N-Acetaminophen Other (See Comments)    REACTION: couldn't wake her up    IV Location/Drains/Wounds Patient Lines/Drains/Airways Status   Active Line/Drains/Airways    Name:   Placement date:   Placement time:   Site:   Days:    Peripheral IV 12/16/17 Left Forearm   12/16/17    1418    Forearm   less than 1   External Urinary Catheter   12/16/17    1420    -   less than 1          Labs/Imaging Results for orders placed or performed during the hospital encounter of 12/16/17 (from the past 48 hour(s))  CBG monitoring, ED     Status: Abnormal   Collection Time: 12/16/17  1:57 PM  Result Value Ref Range   Glucose-Capillary 109 (H) 65 - 99 mg/dL  Basic metabolic panel     Status: Abnormal   Collection Time: 12/16/17  2:18 PM  Result Value Ref Range   Sodium 132 (L) 135 - 145 mmol/L   Potassium 3.8 3.5 - 5.1 mmol/L   Chloride 93 (L) 101 - 111 mmol/L   CO2 28 22 - 32 mmol/L   Glucose, Bld 108 (H) 65 - 99 mg/dL   BUN 14 6 - 20 mg/dL   Creatinine, Ser 0.66 0.44 - 1.00 mg/dL   Calcium 9.2 8.9 - 10.3 mg/dL   GFR calc non Af Amer >60 >60 mL/min   GFR calc Af Amer >60 >60 mL/min    Comment: (NOTE) The eGFR has been calculated using the CKD EPI equation. This calculation has not been validated in all clinical situations. eGFR's persistently <60 mL/min signify possible Chronic Kidney Disease.  Anion gap 11 5 - 15    Comment: Performed at [facility], [place] [address], [place], [place] [address]  CBC     Status: None   Collection Time: 12/16/17  2:18 PM  Result Value Ref Range   WBC 5.3 4.0 - 10.5 K/uL   RBC 4.61 3.87 - 5.11 MIL/uL   Hemoglobin 14.7 12.0 - 15.0 g/dL   HCT 41.7 36.0 - 46.0 %   MCV 90.5 78.0 - 100.0 fL   MCH 31.9 26.0 - 34.0 pg   MCHC 35.3 30.0 - 36.0 g/dL   RDW 13.2 11.5 - 15.5 %   Platelets 189 150 - 400 K/uL    Comment: Performed at [facility], [place] [address], [place], [place] [address]  I-Stat CG4 Lactic Acid, ED     Status: None   Collection Time: 12/16/17  2:25 PM  Result Value Ref Range   Lactic Acid, Venous 0.89 0.5 - 1.9 mmol/L  Urinalysis, Routine w reflex microscopic     Status: Abnormal   Collection Time: 12/16/17  4:33 PM  Result  Value Ref Range   Color, Urine AMBER (A) YELLOW    Comment: BIOCHEMICALS MAY BE AFFECTED BY COLOR   APPearance CLEAR CLEAR   Specific Gravity, Urine 1.010 1.005 - 1.030   pH 7.0 5.0 - 8.0   Glucose, UA NEGATIVE NEGATIVE mg/dL   Hgb urine dipstick SMALL (A) NEGATIVE   Bilirubin Urine NEGATIVE NEGATIVE   Ketones, ur NEGATIVE NEGATIVE mg/dL   Protein, ur NEGATIVE NEGATIVE mg/dL   Nitrite POSITIVE (A) NEGATIVE   Leukocytes, UA LARGE (A) NEGATIVE   RBC / HPF 0-5 0 - 5 RBC/hpf   WBC, UA 21-50 0 - 5 WBC/hpf   Bacteria, UA MANY (A) NONE SEEN   Squamous Epithelial / LPF 0-5 0 - 5    Comment: Performed at [facility], [place] [address], [place], [place] [address]   Ct Abdomen Pelvis W Contrast  Result Date: 12/16/2017 CLINICAL DATA:  Weakness, lethargy, previous UTI,  suprapubic pain EXAM: CT ABDOMEN AND PELVIS WITH CONTRAST TECHNIQUE: Multidetector CT imaging of the abdomen and pelvis was performed using the standard protocol following bolus administration of intravenous contrast. CONTRAST:  177m ISOVUE-300 IOPAMIDOL (ISOVUE-300) INJECTION 61% COMPARISON:  08/24/2016 FINDINGS: Lower chest: Scattered coronary calcifications. No pleural or pericardial effusion. Visualized lung bases clear. Hepatobiliary: 3cm low attenuation collection at the posterior margin of the right hepatic lobe, previously 2.7 cm. No new liver lesion. Gallbladder is nondistended. No biliary ductal dilatation. Pancreas: Unremarkable. No pancreatic ductal dilatation or surrounding inflammatory changes. Spleen: Normal in size without focal abnormality. Adrenals/Urinary Tract: Normal adrenal glands. Normal renal parenchymal enhancement. No hydronephrosis. There is mild right ureterectasis down to the ureteral orifice, without radiodense calculus. The urinary bladder is physiologically distended. Stomach/Bowel: Stomach is decompressed. Small bowel nondilated. Normal appendix. Moderate proximal colonic fecal material,  decompressed distally. Vascular/Lymphatic: Scattered aortoiliac calcified plaque without aneurysm or stenosis. Portal vein patent. Bilateral pelvic phleboliths. No abdominal or pelvic adenopathy. Reproductive: Status post hysterectomy. No adnexal masses. Other: No ascites.  No free air. Musculoskeletal: Spondylitic changes in the lower thoracic and lumbar spine. Thoracolumbar dextroscoliosis apex L2. Negative for fracture or worrisome bone lesion. IMPRESSION: 1. No acute findings. 2. Stable right ureterectasis without hydronephrosis or urolithiasis. 3. Coronary and aortoiliac  atherosclerosis (ICD10-170.0) Electronically Signed   By: DLucrezia EuropeM.D.   On: 12/16/2017 16:03    Pending Labs Unresulted Labs (From admission, onward)  Start     Ordered   12/16/17 1517  Urine culture  STAT,   STAT    Question:  Patient immune status  Answer:  Normal   12/16/17 1518   Signed and Held  CBC  (heparin)  Once,   R    Comments:  Baseline for heparin therapy IF NOT ALREADY DRAWN.  Notify MD if PLT < 100 K.    Signed and Held   Signed and Held  Creatinine, serum  (heparin)  Once,   R    Comments:  Baseline for heparin therapy IF NOT ALREADY DRAWN.    Signed and Held   Signed and Held  Basic metabolic panel  Once,   R     Signed and Held   Signed and Held  CBC  Tomorrow morning,   R     Signed and Held   Signed and Held  Basic metabolic panel  Tomorrow morning,   R     Signed and Held      Vitals/Pain Today's Vitals   12/16/17 1549 12/16/17 1600 12/16/17 1645 12/16/17 1730  BP:  (!) 144/78 (!) 150/74 (!) 155/73  Pulse:  81 68 66  Resp:  16 17 15   Temp:      TempSrc:      SpO2:  95% 91% 94%  Weight:      Height:      PainSc: 7        Isolation Precautions No active isolations  Medications Medications  cefTRIAXone (ROCEPHIN) 1 g in sodium chloride 0.9 % 100 mL IVPB (1 g Intravenous New Bag/Given 12/16/17 1751)  cefTRIAXone (ROCEPHIN) 1 g in sodium chloride 0.9 % 100 mL IVPB (has no  administration in time range)  sodium chloride 0.9 % bolus 500 mL (0 mLs Intravenous Stopped 12/16/17 1631)  iopamidol (ISOVUE-300) 61 % injection 100 mL (100 mLs Intravenous Contrast Given 12/16/17 1530)    Mobility non-ambulatory

## 2017-12-16 NOTE — ED Provider Notes (Signed)
Emergency Department Provider Note   I have reviewed the triage vital signs and the nursing notes.   HISTORY  Chief Complaint Weakness   HPI [name] is a 84 y.o. female with PMH of GERD, HTN, HLD, frequent UTI, and B12 deficiency presents to the emergency department for evaluation of suprapubic abdominal pain with generalized fatigue and weakness.  The patient was recently treated for urinary tract infection with Keflex by her primary care physician.  She began to feel improvement in her symptoms.  She completed her antibiotics 3 days ago.  She has had gradually worsening lower abdominal pain.  Denies fevers.  Over the past 5 days she is felt increasingly weak and fatigued.  She denies any unilateral symptoms.  No headaches.  No chest pain or dyspnea.  He also feels a burning sensation in her tongue which she attributes to reflux.    Past Medical History:  Diagnosis Date  . Allergy    rhinitis  . Chronic ethmoidal sinusitis   . Diverticulosis   . Diverticulosis of colon   . GERD (gastroesophageal reflux disease)   . Glaucoma   . Hx of colonic polyps   . Hyperlipidemia   . Hypertension   . Hypogammaglobulinemia ([place])    Monthly IVIG  . Leukemia ([place])    Chronic lymphocytic leukemia  . Osteoarthritis   . Peripheral neuropathy   . Pernicious anemia   . UTI (lower urinary tract infection) 2010  . Vitamin B12 deficiency     Patient Active Problem List   Diagnosis Date Noted  . Altered mental status 12/16/2017  . Diarrhea 09/19/2017  . Constipation 09/12/2017  . Alzheimer disease 07/25/2017  . Neck pain on left side 07/25/2017  . Urinary tract infection, recurrent 06/12/2017  . Thrush 02/10/2017  . Frequent urination 02/10/2017  . Osteoporosis 11/23/2016  . Abdominal pain 08/16/2016  . Urinary frequency 08/16/2016  . Peripheral edema 08/16/2016  . General weakness 08/16/2016  . Gait disorder 08/16/2016  . Dysuria 08/03/2016  . Closed displaced simple  supracondylar fracture of right humerus without intercondylar fracture 05/10/2016  . Urinary tract infection without hematuria 05/10/2016  . Syncope 05/10/2016  . Humeral head fracture, right, closed, initial encounter   . Burning tongue 03/25/2016  . Post concussion syndrome 03/03/2016  . Fall 03/03/2016  . Cystocele with prolapse 03/05/2015  . Dehydration 02/08/2015  . Hyponatremia 02/08/2015  . Well adult exam 10/01/2014  . Grief at loss of child 12/11/2013  . Irregular heart beat 09/29/2013  . Humeral head fracture 08/26/2013  . Actinic keratoses 08/26/2013  . Carotid bruit 05/02/2012  . Anxiety 03/17/2011  . ANEMIA, PERNICIOUS, HX OF 10/10/2007  . Peripheral neuropathy ([place]) 09/04/2007  . Chronic fatigue 08/06/2007  . Chronic lymphocytic leukemia ([place]) 06/17/2007  . Allergic rhinitis 06/17/2007  . OSTEOARTHRITIS 06/17/2007  . B12 deficiency 06/07/2007  . GERD (gastroesophageal reflux disease) 04/30/2007  . Dyslipidemia 12/19/2006  . Essential hypertension 12/19/2006  . DIVERTICULOSIS, COLON 05/19/2003  . COLONIC POLYPS, HX OF 01/10/2000    Past Surgical History:  Procedure Laterality Date  . ABDOMINAL HYSTERECTOMY  1987  . bilateral cataracts  2007  . bladder tack  1987  . L4-L5 Laminetomy  1999  . TUBAL LIGATION    . tubaligation  1961  . ureter reconstruction  1992      Allergies Pneumococcal vaccines; Clarithromycin; Hctz [hydrochlorothiazide]; Oxycodone-aspirin; and Propoxyphene n-acetaminophen  Family History  Problem Relation Age of Onset  . Jaundice Father   .  Stroke Son 4       in NH  . Cancer Mother        uterine  . Diabetes Mother   . Other Sister        brain tumor  . Cancer Sister   . Hypertension Other   . Cancer Other        breast  . Thyroid disease Sister   . Cancer Sister        thyroid cancer    Social History Social History   Tobacco Use  . Smoking status: Never Smoker  . Smokeless tobacco: Never Used  Substance Use  Topics  . Alcohol use: No    Alcohol/week: 0.0 oz  . Drug use: No    Review of Systems  Constitutional: No fever/chills. Positive generalized weakness.  Eyes: No visual changes. ENT: No sore throat. Cardiovascular: Denies chest pain. Respiratory: Denies shortness of breath.  Gastrointestinal: Positive lower abdominal pain.  No nausea, no vomiting.  No diarrhea.  No constipation. Genitourinary: Positive for dysuria. Musculoskeletal: Negative for back pain. Skin: Negative for rash. Neurological: Negative for headaches, focal weakness or numbness.  10-point ROS otherwise negative.  ____________________________________________   PHYSICAL EXAM:  VITAL SIGNS: ED Triage Vitals  Enc Vitals Group     BP 12/16/17 1349 (!) 157/71     Pulse Rate 12/16/17 1349 62     Resp 12/16/17 1349 19     Temp 12/16/17 1358 97.8 F (36.6 C)     Temp Source 12/16/17 1349 Oral     SpO2 12/16/17 1333 96 %     Weight 12/16/17 1337 130 lb (59 kg)     Height 12/16/17 1337 5\' 4"  (1.626 m)     Pain Score 12/16/17 1337 8   Constitutional: Alert and oriented. Well appearing and in no acute distress. Eyes: Conjunctivae are normal. PERRL.  Head: Atraumatic. Nose: No congestion/rhinnorhea. Mouth/Throat: Mucous membranes are dry.  Neck: No stridor.   Cardiovascular: Normal rate, regular rhythm. Good peripheral circulation. Grossly normal heart sounds.   Respiratory: Normal respiratory effort.  No retractions. Lungs CTAB. Gastrointestinal: Soft with focal lower abdominal pain and RLQ pain. No rebound or guarding. No distention.  Musculoskeletal: No lower extremity tenderness nor edema. No gross deformities of extremities. Neurologic:  Normal speech and language. No gross focal neurologic deficits are appreciated.  Skin:  Skin is warm, dry and intact. No rash noted.   ____________________________________________   LABS (all labs ordered are listed, but only abnormal results are displayed)  Labs  Reviewed  BASIC METABOLIC PANEL - Abnormal; Notable for the following components:      Result Value   Sodium 132 (*)    Chloride 93 (*)    Glucose, Bld 108 (*)    All other components within normal limits  URINALYSIS, ROUTINE W REFLEX MICROSCOPIC - Abnormal; Notable for the following components:   Color, Urine AMBER (*)    Hgb urine dipstick SMALL (*)    Nitrite POSITIVE (*)    Leukocytes, UA LARGE (*)    Bacteria, UA MANY (*)    All other components within normal limits  BASIC METABOLIC PANEL - Abnormal; Notable for the following components:   Sodium 134 (*)    Chloride 96 (*)    Glucose, Bld 146 (*)    All other components within normal limits  CBG MONITORING, ED - Abnormal; Notable for the following components:   Glucose-Capillary 109 (*)    All other components within normal limits  URINE CULTURE  CBC  CBC  BASIC METABOLIC PANEL  I-STAT CG4 LACTIC ACID, ED   ____________________________________________  EKG   EKG Interpretation  Date/Time:  Sunday Dec 16 2017 13:47:41 EDT Ventricular Rate:  64 PR Interval:    QRS Duration: 95 QT Interval:  432 QTC Calculation: 446 R Axis:   -15 Text Interpretation:  Sinus or ectopic atrial rhythm Prolonged PR interval Borderline left axis deviation Low voltage, extremity leads Abnormal R-wave progression, early transition No STEMI.  Confirmed by Nanda Quinton [phone]) on 12/16/2017 3:36:11 PM Also confirmed by Nanda Quinton [phone]), editor Philomena Doheny [phone])  on 12/16/2017 3:47:31 PM       ____________________________________________  RADIOLOGY  Ct Abdomen Pelvis W Contrast  Result Date: 12/16/2017 CLINICAL DATA:  Weakness, lethargy, previous UTI,  suprapubic pain EXAM: CT ABDOMEN AND PELVIS WITH CONTRAST TECHNIQUE: Multidetector CT imaging of the abdomen and pelvis was performed using the standard protocol following bolus administration of intravenous contrast. CONTRAST:  187mL ISOVUE-300 IOPAMIDOL (ISOVUE-300) INJECTION 61%  COMPARISON:  08/24/2016 FINDINGS: Lower chest: Scattered coronary calcifications. No pleural or pericardial effusion. Visualized lung bases clear. Hepatobiliary: 3cm low attenuation collection at the posterior margin of the right hepatic lobe, previously 2.7 cm. No new liver lesion. Gallbladder is nondistended. No biliary ductal dilatation. Pancreas: Unremarkable. No pancreatic ductal dilatation or surrounding inflammatory changes. Spleen: Normal in size without focal abnormality. Adrenals/Urinary Tract: Normal adrenal glands. Normal renal parenchymal enhancement. No hydronephrosis. There is mild right ureterectasis down to the ureteral orifice, without radiodense calculus. The urinary bladder is physiologically distended. Stomach/Bowel: Stomach is decompressed. Small bowel nondilated. Normal appendix. Moderate proximal colonic fecal material, decompressed distally. Vascular/Lymphatic: Scattered aortoiliac calcified plaque without aneurysm or stenosis. Portal vein patent. Bilateral pelvic phleboliths. No abdominal or pelvic adenopathy. Reproductive: Status post hysterectomy. No adnexal masses. Other: No ascites.  No free air. Musculoskeletal: Spondylitic changes in the lower thoracic and lumbar spine. Thoracolumbar dextroscoliosis apex L2. Negative for fracture or worrisome bone lesion. IMPRESSION: 1. No acute findings. 2. Stable right ureterectasis without hydronephrosis or urolithiasis. 3. Coronary and aortoiliac  atherosclerosis (ICD10-170.0) Electronically Signed   By: Lucrezia Europe M.D.   On: 12/16/2017 16:03    ____________________________________________   PROCEDURES  Procedure(s) performed:   Procedures  None ____________________________________________   INITIAL IMPRESSION / ASSESSMENT AND PLAN / ED COURSE  Pertinent labs & imaging results that were available during my care of the patient were reviewed by me and considered in my medical decision making (see chart for details).  Patient  presents to the emergency department for evaluation of dysuria with suprapubic pain and generalized fatigue and weakness.  Patient does appear generally weak.  She has no focal neurological deficits.  She does have focal suprapubic and some right-sided abdominal discomfort without rebound or guarding.  Given the patient's age and abdominal pain I plan to perform a CT scan although my suspicion is elevated for urinary tract infection which is pending.  Labs ordered from triage reviewed.  Plan for IV fluids while waiting on CT.  CT with no acute findings. UA shows a UTI. Patient is having significant lower abdominal discomfort so will treat. Suspect that fatigue and weakness with mild/moderate confusion is also related to UTI. Patient will require admission for further treatment.   Discussed patient's case with Hospitalist, Dr. Wendee Beavers to request admission. Patient and family (if present) updated with plan. Care transferred to Hospitalist service.  I reviewed all nursing notes, vitals, pertinent old records, EKGs, labs, imaging (as available).  ____________________________________________  FINAL CLINICAL IMPRESSION(S) / ED DIAGNOSES  Final diagnoses:  Acute cystitis without hematuria  Generalized weakness     MEDICATIONS GIVEN DURING THIS VISIT:  Medications  acetaminophen (TYLENOL) tablet 500 mg (500 mg Oral Given 12/16/17 2242)  pantoprazole (PROTONIX) EC tablet 40 mg (has no administration in time range)  fluticasone (FLONASE) 50 MCG/ACT nasal spray 2 spray (has no administration in time range)  folic acid (FOLVITE) tablet 1 mg (has no administration in time range)  gabapentin (NEURONTIN) capsule 300 mg (has no administration in time range)  latanoprost (XALATAN) 0.005 % ophthalmic solution 1 drop (1 drop Both Eyes Given 12/16/17 2307)  mirabegron ER (MYRBETRIQ) tablet 50 mg (has no administration in time range)  rosuvastatin (CRESTOR) tablet 5 mg (has no administration in time range)    sucralfate (CARAFATE) tablet 1 g (1 g Oral Given 12/16/17 2243)  heparin injection 5,000 Units (5,000 Units Subcutaneous Given 12/16/17 2245)  sodium chloride flush (NS) 0.9 % injection 3 mL (3 mLs Intravenous Given 12/16/17 2244)  sodium chloride flush (NS) 0.9 % injection 3 mL (has no administration in time range)  0.9 %  sodium chloride infusion (has no administration in time range)  cefTRIAXone (ROCEPHIN) 1 g in sodium chloride 0.9 % 100 mL IVPB (has no administration in time range)  aspirin EC tablet 81 mg (has no administration in time range)  cholecalciferol (VITAMIN D) tablet 2,000 Units (has no administration in time range)  memantine (NAMENDA XR) 24 hr capsule 28 mg (28 mg Oral Given 12/16/17 2307)    And  donepezil (ARICEPT) tablet 10 mg (10 mg Oral Given 12/16/17 2243)  traMADol (ULTRAM) tablet 50 mg (50 mg Oral Given 12/16/17 2021)  sodium chloride 0.9 % bolus 500 mL (0 mLs Intravenous Stopped 12/16/17 1631)  iopamidol (ISOVUE-300) 61 % injection 100 mL (100 mLs Intravenous Contrast Given 12/16/17 1530)  cefTRIAXone (ROCEPHIN) 1 g in sodium chloride 0.9 % 100 mL IVPB (1 g Intravenous New Bag/Given 12/16/17 1751)    Note:  This document was prepared using Dragon voice recognition software and may include unintentional dictation errors.  Nanda Quinton, MD Emergency Medicine    Rojean Ige, [facility], MD 12/17/17 [phone]

## 2017-12-16 NOTE — ED Notes (Signed)
Patient transported to CT 

## 2017-12-16 NOTE — ED Notes (Signed)
Bed: AC16 Expected date:  Expected time:  Means of arrival:  Comments: 52 F weakness

## 2017-12-17 DIAGNOSIS — N3000 Acute cystitis without hematuria: Secondary | ICD-10-CM | POA: Diagnosis not present

## 2017-12-17 LAB — BASIC METABOLIC PANEL
ANION GAP: 11 (ref 5–15)
BUN: 15 mg/dL (ref 6–20)
CALCIUM: 8.8 mg/dL — AB (ref 8.9–10.3)
CO2: 25 mmol/L (ref 22–32)
CREATININE: 0.74 mg/dL (ref 0.44–1.00)
Chloride: 98 mmol/L — ABNORMAL LOW (ref 101–111)
GFR calc Af Amer: >60 mL/min (ref >60)
GLUCOSE: 89 mg/dL (ref 65–99)
Potassium: 3.6 mmol/L (ref 3.5–5.1)
SODIUM: 134 mmol/L — AB (ref 135–145)

## 2017-12-17 LAB — CBC
HCT: 42.2 % (ref 36.0–46.0)
HEMOGLOBIN: 14.5 g/dL (ref 12.0–15.0)
MCH: 31 pg (ref 26.0–34.0)
MCHC: 34.4 g/dL (ref 30.0–36.0)
MCV: 90.2 fL (ref 78.0–100.0)
PLATELETS: 218 10*3/uL (ref 150–400)
RBC: 4.68 MIL/uL (ref 3.87–5.11)
RDW: 13.4 % (ref 11.5–15.5)
WBC: 7.6 10*3/uL (ref 4.0–10.5)

## 2017-12-17 MED ORDER — ENSURE ENLIVE PO LIQD
237.0000 mL | Freq: Two times a day (BID) | ORAL | Status: DC
  Administered 2017-12-17 – 2017-12-18 (×2): 237 mL via ORAL

## 2017-12-17 NOTE — Progress Notes (Signed)
Received consult for home health needs- advised consult care management for home health needs.  Will follow for social work needs if arise.   Sharren Bridge, MSW, LCSW Clinical Social Work 12/17/2017 [phone]

## 2017-12-17 NOTE — Progress Notes (Signed)
PROGRESS NOTE    [name]  GYF:[id number] DOB: 24-Dec-1925 DOA: 12/16/2017 PCP: Cassandria Anger, MD    Brief Narrative:   84 y.o. female with medical history significant of with 3-day complaint of above problems.  Reportedly recently treated with Keflex by primary care physician for UTI  Pt presented with AMS 2ary to UTI.   Assessment & Plan:   Active Problems:   Urinary tract infection without hematuria -Plan will be to continue current antibiotic therapy -Follow-up with urine culture    Altered mental status -Patient is still pleasantly confused.    GERD (gastroesophageal reflux disease) -Stable on Protonix    Alzheimer disease -Stable continue home medication regimen    Anxiety - stable  DVT prophylaxis: Heparin Code Status: Full Family Communication: None at bedside Disposition Plan: Pending resolution of altered mental status   Consultants:   None   Procedures: None   Antimicrobials: Rocephin   Subjective: Pt states she has been halucinating at times. Seeing people that are not there. But she feels more with it today  Objective: Vitals:   12/16/17 1730 12/16/17 1858 12/16/17 2149 12/17/17 0522  BP: (!) 155/73 (!) 163/79 116/69 134/68  Pulse: 66 72 67 70  Resp: 15 16 16 16   Temp:  97.8 F (36.6 C) 98.2 F (36.8 C) 97.9 F (36.6 C)  TempSrc:  Oral Oral Oral  SpO2: 94% 94% 94% 94%  Weight:      Height:        Intake/Output Summary (Last 24 hours) at 12/17/2017 1502 Last data filed at 12/17/2017 1412 Gross per 24 hour  Intake 953 ml  Output -  Net 953 ml   Filed Weights   12/16/17 1337  Weight: 59 kg (130 lb)    Examination:  General exam: Appears calm and comfortable, in nad. Respiratory system: Clear to auscultation. Respiratory effort normal. Cardiovascular system: S1 & S2 heard, RRR. No JVD, murmurs, rubs, gallops or clicks. No pedal edema. Gastrointestinal system: Abdomen is nondistended, soft and nontender. No  organomegaly or masses felt. Normal bowel sounds heard. Central nervous system: Alert and awake. No focal neurological deficits. Extremities: Symmetric 5 x 5 power. Skin: No rashes, lesions or ulcers Psychiatry: Mood & affect appropriate.     Data Reviewed: I have personally reviewed following labs and imaging studies  CBC: Recent Labs  Lab 12/16/17 1418 12/17/17 0439  WBC 5.3 7.6  HGB 14.7 14.5  HCT 41.7 42.2  MCV 90.5 90.2  PLT 189 916   Basic Metabolic Panel: Recent Labs  Lab 12/16/17 1418 12/16/17 1921 12/17/17 0439  NA 132* 134* 134*  K 3.8 3.6 3.6  CL 93* 96* 98*  CO2 28 27 25   GLUCOSE 108* 146* 89  BUN 14 13 15   CREATININE 0.66 0.70 0.74  CALCIUM 9.2 9.0 8.8*   GFR: Estimated Creatinine Clearance: 41.2 mL/min (by C-G formula based on SCr of 0.74 mg/dL). Liver Function Tests: No results for input(s): AST, ALT, ALKPHOS, BILITOT, PROT, ALBUMIN in the last 168 hours. No results for input(s): LIPASE, AMYLASE in the last 168 hours. No results for input(s): AMMONIA in the last 168 hours. Coagulation Profile: No results for input(s): INR, PROTIME in the last 168 hours. Cardiac Enzymes: No results for input(s): CKTOTAL, CKMB, CKMBINDEX, TROPONINI in the last 168 hours. BNP (last 3 results) No results for input(s): PROBNP in the last 8760 hours. HbA1C: No results for input(s): HGBA1C in the last 72 hours. CBG: Recent Labs  Lab 12/16/17 1357  GLUCAP 109*   Lipid Profile: No results for input(s): CHOL, HDL, LDLCALC, TRIG, CHOLHDL, LDLDIRECT in the last 72 hours. Thyroid Function Tests: No results for input(s): TSH, T4TOTAL, FREET4, T3FREE, THYROIDAB in the last 72 hours. Anemia Panel: No results for input(s): VITAMINB12, FOLATE, FERRITIN, TIBC, IRON, RETICCTPCT in the last 72 hours. Sepsis Labs: Recent Labs  Lab 12/16/17 1425  LATICACIDVEN 0.89    No results found for this or any previous visit (from the past 240 hour(s)).       Radiology  Studies: Ct Abdomen Pelvis W Contrast  Result Date: 12/16/2017 CLINICAL DATA:  Weakness, lethargy, previous UTI,  suprapubic pain EXAM: CT ABDOMEN AND PELVIS WITH CONTRAST TECHNIQUE: Multidetector CT imaging of the abdomen and pelvis was performed using the standard protocol following bolus administration of intravenous contrast. CONTRAST:  131mL ISOVUE-300 IOPAMIDOL (ISOVUE-300) INJECTION 61% COMPARISON:  08/24/2016 FINDINGS: Lower chest: Scattered coronary calcifications. No pleural or pericardial effusion. Visualized lung bases clear. Hepatobiliary: 3cm low attenuation collection at the posterior margin of the right hepatic lobe, previously 2.7 cm. No new liver lesion. Gallbladder is nondistended. No biliary ductal dilatation. Pancreas: Unremarkable. No pancreatic ductal dilatation or surrounding inflammatory changes. Spleen: Normal in size without focal abnormality. Adrenals/Urinary Tract: Normal adrenal glands. Normal renal parenchymal enhancement. No hydronephrosis. There is mild right ureterectasis down to the ureteral orifice, without radiodense calculus. The urinary bladder is physiologically distended. Stomach/Bowel: Stomach is decompressed. Small bowel nondilated. Normal appendix. Moderate proximal colonic fecal material, decompressed distally. Vascular/Lymphatic: Scattered aortoiliac calcified plaque without aneurysm or stenosis. Portal vein patent. Bilateral pelvic phleboliths. No abdominal or pelvic adenopathy. Reproductive: Status post hysterectomy. No adnexal masses. Other: No ascites.  No free air. Musculoskeletal: Spondylitic changes in the lower thoracic and lumbar spine. Thoracolumbar dextroscoliosis apex L2. Negative for fracture or worrisome bone lesion. IMPRESSION: 1. No acute findings. 2. Stable right ureterectasis without hydronephrosis or urolithiasis. 3. Coronary and aortoiliac  atherosclerosis (ICD10-170.0) Electronically Signed   By: Lucrezia Europe M.D.   On: 12/16/2017 16:03         Scheduled Meds: . acetaminophen  500 mg Oral BID  . aspirin EC  81 mg Oral Daily  . cholecalciferol  2,000 Units Oral Daily  . memantine  28 mg Oral QHS   And  . donepezil  10 mg Oral QHS  . feeding supplement (ENSURE ENLIVE)  237 mL Oral BID BM  . fluticasone  2 spray Each Nare Daily  . folic acid  1 mg Oral Daily  . gabapentin  300 mg Oral Daily  . heparin  5,000 Units Subcutaneous Q8H  . latanoprost  1 drop Both Eyes QHS  . mirabegron ER  50 mg Oral Daily  . pantoprazole  40 mg Oral Daily  . rosuvastatin  5 mg Oral Daily  . sodium chloride flush  3 mL Intravenous Q12H  . sucralfate  1 g Oral TID WC & HS   Continuous Infusions: . sodium chloride    . cefTRIAXone (ROCEPHIN)  IV       LOS: 0 days    Time spent: > 35 minutes    Velvet Bathe, MD [facility] Pager [phone]  If 7PM-7AM, please contact night-coverage [email or web address] Password [email or web address] 12/17/2017, 3:02 PM

## 2017-12-18 DIAGNOSIS — N3000 Acute cystitis without hematuria: Secondary | ICD-10-CM | POA: Diagnosis not present

## 2017-12-18 MED ORDER — ENSURE ENLIVE PO LIQD
237.0000 mL | Freq: Three times a day (TID) | ORAL | Status: DC
  Administered 2017-12-18 – 2017-12-19 (×5): 237 mL via ORAL

## 2017-12-18 MED ORDER — HEPARIN SOD (PORK) LOCK FLUSH 100 UNIT/ML IV SOLN: 500.0000 [IU] | Freq: Once | INTRAVENOUS | Status: DC

## 2017-12-18 NOTE — Progress Notes (Signed)
   12/18/17 1500  Clinical Encounter Type  Visited With Patient and family together  Visit Type Initial;Psychological support;Spiritual support  Referral From Nurse  Consult/Referral To Chaplain  Spiritual Encounters  Spiritual Needs Emotional;Other (Comment) (Spiritual Care Conversation/Support)  Stress Factors  Patient Stress Factors Health changes;Other (Comment) (Aging )  Family Stress Factors Health changes  Advance Directives (For Healthcare)  Does Patient Have a Medical Advance Directive? No  Would patient like information on creating a medical advance directive?  (Patient Unable to make due to competency )   I visited with the patient per [place]. The patient was wanting information about an Advance Directive and a DNR. Her son was present in the room and concerned about the patient's ability to make decisions. [name] states that the patient has been confused.  The patient stated that she has "been so sick, and wants to go ahead and die."  I explained to the patient that due to her current confusion, we could not complete an Advance Directive at this time.  I spoke with the patient's bedside nurse.   Please, contact Spiritual Care for further assistance.   Chaplain Shanon Ace M.Div., [facility]

## 2017-12-18 NOTE — Progress Notes (Signed)
Pt reporting visual hallucinations, states" seeing a person when I close my eyes". Pt

## 2017-12-18 NOTE — Progress Notes (Signed)
Initial Nutrition Assessment   INTERVENTION:   Provide Ensure Enlive po TID, each supplement provides 350 kcal and 20 grams of protein  NUTRITION DIAGNOSIS:   Inadequate oral intake related to acute illness as evidenced by per patient/family report.  GOAL:   Patient will meet greater than or equal to 90% of their needs  MONITOR:   PO intake, Supplement acceptance, I & O's, Labs, Weight trends  REASON FOR ASSESSMENT:   Malnutrition Screening Tool    ASSESSMENT:   84 y.o. female with medical history significant of with 3-day complaint of above problems.  Reportedly recently treated with Keflex by primary care physician for UTI  Patient in room with family at bedside. Per son, pt usually eats well at home. Pt states she ate yesterday but today seems to feel worse. Per RN, pt did not eat any breakfast but pt reports she ate eggs and peanut butter toast (which is what she eats at home per son). Pt is sipping on an Ensure supplement and states she will try and drink them today while appetite is poor.  Pt denies issues with swallowing.   Per chart, weight has been stable.  Medications: Vitamin D tablet daily, Folic acid tablet daily, Carafate tablet TID Labs reviewed: Low Na   NUTRITION - FOCUSED PHYSICAL EXAM:    Most Recent Value  Orbital Region  Mild depletion  Upper Arm Region  No depletion  Thoracic and Lumbar Region  Unable to assess  Buccal Region  No depletion  Temple Region  Mild depletion  Clavicle Bone Region  No depletion  Clavicle and Acromion Bone Region  No depletion  Scapular Bone Region  Unable to assess  Dorsal Hand  Severe depletion  Patellar Region  Unable to assess  Anterior Thigh Region  Unable to assess  Posterior Calf Region  Unable to assess  Edema (RD Assessment)  Mild       Diet Order:   Diet Order           Diet Heart Room service appropriate? Yes; Fluid consistency: Thin  Diet effective now          EDUCATION NEEDS:   No  education needs have been identified at this time  Skin:  Skin Assessment: Reviewed RN Assessment  Last BM:  5/25  Height:   Ht Readings from Last 1 Encounters:  12/16/17 5\' 4"  (1.626 m)    Weight:   Wt Readings from Last 1 Encounters:  12/16/17 130 lb (59 kg)    Ideal Body Weight:  54.5 kg  BMI:  Body mass index is 22.31 kg/m.  Estimated Nutritional Needs:   Kcal:  1191-4782  Protein:  60-70g  Fluid:  1.6L/day   Clayton Bibles, MS, RD, LDN [place] [facility] Pager: [phone] After Hours Pager: [phone]

## 2017-12-18 NOTE — Evaluation (Signed)
Physical Therapy Evaluation Patient Details Name: [name] MRN: [id number] DOB: 12/28/1925 Today's Date: 12/18/2017   History of Present Illness  84 y.o. female with PMH of GERD, HTN, HLD, frequent UTI, and B12 deficiency presents to the emergency department for evaluation of suprapubic abdominal pain with generalized fatigue and weakness  Clinical Impression  Pt admitted with above diagnosis. Pt currently with functional limitations due to the deficits listed below (see PT Problem List). Mod assist for supine to sit, sit to stand, and to pivot to recliner with RW. Pt tremulous and unsteady in standing. She lacks 24* assistance at home, ST-SNF recommended, pt agreeable. Pt will benefit from skilled PT to increase their independence and safety with mobility to allow discharge to the venue listed below.       Follow Up Recommendations SNF;Supervision/Assistance - 24 hour;Supervision for mobility/OOB    Equipment Recommendations  Wheelchair (measurements PT);Wheelchair cushion (measurements PT)    Recommendations for Other Services       Precautions / Restrictions Precautions Precautions: Fall Precaution Comments: pt vague historian, did admit to some falls in past year but unable to state how many or when they occurred Restrictions Weight Bearing Restrictions: No      Mobility  Bed Mobility Overal bed mobility: Needs Assistance Bed Mobility: Supine to Sit     Supine to sit: Mod assist     General bed mobility comments: assist to raise trunk and advance LEs  Transfers Overall transfer level: Needs assistance Equipment used: Rolling walker (2 wheeled) Transfers: Sit to/from [facility] Sit to Stand: Mod assist Stand pivot transfers: Mod assist       General transfer comment: assist to rise and steady; steadying assist to take several steps to recliner with RW; pt tremulous in standing  Ambulation/Gait             General Gait Details: NT-pt  unsteady with taking a few steps to recliner  Stairs            Wheelchair Mobility    Modified Rankin (Stroke Patients Only)       Balance Overall balance assessment: Needs assistance Sitting-balance support: Feet supported Sitting balance-Leahy Scale: Fair     Standing balance support: Bilateral upper extremity supported Standing balance-Leahy Scale: Poor                               Pertinent Vitals/Pain Pain Assessment: No/denies pain    Home Living Family/patient expects to be discharged to:: Private residence Living Arrangements: Children(son assists prn but isn't available 24*) Available Help at Discharge: Family;Available PRN/intermittently   Home Access: Ramped entrance     Home Layout: One level Home Equipment: Walker - 2 wheels;Walker - 4 wheels;Cane - single point;Shower seat - built in;Bedside commode      Prior Function Level of Independence: Independent with assistive device(s)         Comments: walks with rollator     Hand Dominance   Dominant Hand: Right    Extremity/Trunk Assessment   Upper Extremity Assessment Upper Extremity Assessment: Generalized weakness    Lower Extremity Assessment Lower Extremity Assessment: Generalized weakness(B knee ext 4/5)    Cervical / Trunk Assessment Cervical / Trunk Assessment: Kyphotic  Communication   Communication: No difficulties  Cognition Arousal/Alertness: Awake/alert Behavior During Therapy: WFL for tasks assessed/performed Overall Cognitive Status: No family/caregiver present to determine baseline cognitive functioning  General Comments: pt oriented to location, self, and situation. Able to follow commands. Multiple times reported hearing a rumbling noise then feeling weak afterwards earlier today.       General Comments      Exercises     Assessment/Plan    PT Assessment Patient needs continued PT services  PT  Problem List Decreased strength;Decreased activity tolerance;Decreased balance;Decreased mobility       PT Treatment Interventions Gait training;Functional mobility training;Therapeutic activities;Therapeutic exercise;Balance training;Patient/family education    PT Goals (Current goals can be found in the Care Plan section)  Acute Rehab PT Goals Patient Stated Goal: pt agreeable to SNF -prefers [facility] PT Goal Formulation: With patient Time For Goal Achievement: 01/01/18 Potential to Achieve Goals: Fair    Frequency Min 2X/week   Barriers to discharge Decreased caregiver support      Co-evaluation               AM-PAC PT "6 Clicks" Daily Activity  Outcome Measure Difficulty turning over in bed (including adjusting bedclothes, sheets and blankets)?: Unable Difficulty moving from lying on back to sitting on the side of the bed? : Unable Difficulty sitting down on and standing up from a chair with arms (e.g., wheelchair, bedside commode, etc,.)?: Unable Help needed moving to and from a bed to chair (including a wheelchair)?: A Lot Help needed walking in hospital room?: Total Help needed climbing 3-5 steps with a railing? : Total 6 Click Score: 7    End of Session Equipment Utilized During Treatment: Gait belt Activity Tolerance: Patient limited by fatigue Patient left: in chair;with call bell/phone within reach;with chair alarm set Nurse Communication: Mobility status PT Visit Diagnosis: Unsteadiness on feet (R26.81);Muscle weakness (generalized) (M62.81)    Time: 7858-8502 PT Time Calculation (min) (ACUTE ONLY): 23 min   Charges:   PT Evaluation $PT Eval Low Complexity: 1 Low PT Treatments $Therapeutic Activity: 8-22 mins   PT G Codes:          Philomena Doheny 12/18/2017, 2:49 PM [phone]

## 2017-12-18 NOTE — Progress Notes (Signed)
PROGRESS NOTE    [name]  BOF:[id number] DOB: Dec 27, 1925 DOA: 12/16/2017 PCP: Cassandria Anger, MD    Brief Narrative:   84 y.o. female with medical history significant of with 3-day complaint of above problems.  Reportedly recently treated with Keflex by primary care physician for UTI  Pt presented with AMS 2ary to UTI.    Assessment & Plan:   Active Problems:   Urinary tract infection without hematuria - Plan will be to continue current antibiotic therapy - Follow-up with urine culture     Altered mental status - Patient is still pleasantly confused. - 2ary to principle problem listed above. Will continue current abx regimen for 1 more day    GERD (gastroesophageal reflux disease) - Stable on Protonix    Alzheimer disease - Stable    Anxiety - stable  DVT prophylaxis: Heparin Code Status: Full Family Communication: None at bedside Disposition Plan: Pending resolution of altered mental status   Consultants:   None   Procedures: None   Antimicrobials: Rocephin   Subjective: No hallucinations reported for today  Objective: Vitals:   12/17/17 1704 12/17/17 2037 12/18/17 0525 12/18/17 1223  BP: (!) 141/67 (!) 149/74 134/74 (!) 172/78  Pulse: (!) 57 69 72 73  Resp: 16 12 16 14   Temp: 97.8 F (36.6 C) 98 F (36.7 C) 98.3 F (36.8 C) 97.8 F (36.6 C)  TempSrc: Oral Oral Oral Oral  SpO2: 93% 96% 94% 95%  Weight:      Height:        Intake/Output Summary (Last 24 hours) at 12/18/2017 1625 Last data filed at 12/18/2017 1545 Gross per 24 hour  Intake 360 ml  Output 1020 ml  Net -660 ml   Filed Weights   12/16/17 1337  Weight: 59 kg (130 lb)    Examination:  General exam: Appears calm and comfortable, in nad. Respiratory system: Clear to auscultation. Respiratory effort normal. Cardiovascular system: S1 & S2 heard, RRR. No JVD, murmurs, rubs, gallops or clicks. No pedal edema. Gastrointestinal system: Abdomen is nondistended,  soft and nontender. No organomegaly or masses felt. Normal bowel sounds heard. Central nervous system: Alert and awake. No focal neurological deficits. Extremities: Symmetric 5 x 5 power. Skin: No rashes, lesions or ulcers Psychiatry: Mood & affect appropriate.   Data Reviewed: I have personally reviewed following labs and imaging studies  CBC: Recent Labs  Lab 12/16/17 1418 12/17/17 0439  WBC 5.3 7.6  HGB 14.7 14.5  HCT 41.7 42.2  MCV 90.5 90.2  PLT 189 778   Basic Metabolic Panel: Recent Labs  Lab 12/16/17 1418 12/16/17 1921 12/17/17 0439  NA 132* 134* 134*  K 3.8 3.6 3.6  CL 93* 96* 98*  CO2 28 27 25   GLUCOSE 108* 146* 89  BUN 14 13 15   CREATININE 0.66 0.70 0.74  CALCIUM 9.2 9.0 8.8*   GFR: Estimated Creatinine Clearance: 41.2 mL/min (by C-G formula based on SCr of 0.74 mg/dL). Liver Function Tests: No results for input(s): AST, ALT, ALKPHOS, BILITOT, PROT, ALBUMIN in the last 168 hours. No results for input(s): LIPASE, AMYLASE in the last 168 hours. No results for input(s): AMMONIA in the last 168 hours. Coagulation Profile: No results for input(s): INR, PROTIME in the last 168 hours. Cardiac Enzymes: No results for input(s): CKTOTAL, CKMB, CKMBINDEX, TROPONINI in the last 168 hours. BNP (last 3 results) No results for input(s): PROBNP in the last 8760 hours. HbA1C: No results for input(s): HGBA1C in the last 72 hours.  CBG: Recent Labs  Lab 12/16/17 1357  GLUCAP 109*   Lipid Profile: No results for input(s): CHOL, HDL, LDLCALC, TRIG, CHOLHDL, LDLDIRECT in the last 72 hours. Thyroid Function Tests: No results for input(s): TSH, T4TOTAL, FREET4, T3FREE, THYROIDAB in the last 72 hours. Anemia Panel: No results for input(s): VITAMINB12, FOLATE, FERRITIN, TIBC, IRON, RETICCTPCT in the last 72 hours. Sepsis Labs: Recent Labs  Lab 12/16/17 1425  LATICACIDVEN 0.89    Recent Results (from the past 240 hour(s))  Urine culture     Status: Abnormal  (Preliminary result)   Collection Time: 12/16/17  4:33 PM  Result Value Ref Range Status   Specimen Description   Final    URINE, CATHETERIZED Performed at [facility] [address], [place], [place] [address]    Special Requests   Final    Normal Performed at [facility], [place] [address], [place], [place] [address]    Culture (A)  Final    >=100,000 COLONIES/mL KLEBSIELLA OXYTOCA SUSCEPTIBILITIES TO FOLLOW Performed at [facility], [place] [address] [place],  [address]    Report Status PENDING  Incomplete         Radiology Studies: No results found.      Scheduled Meds: . acetaminophen  500 mg Oral BID  . aspirin EC  81 mg Oral Daily  . cholecalciferol  2,000 Units Oral Daily  . memantine  28 mg Oral QHS   And  . donepezil  10 mg Oral QHS  . feeding supplement (ENSURE ENLIVE)  237 mL Oral TID BM  . fluticasone  2 spray Each Nare Daily  . folic acid  1 mg Oral Daily  . gabapentin  300 mg Oral Daily  . heparin  5,000 Units Subcutaneous Q8H  . latanoprost  1 drop Both Eyes QHS  . mirabegron ER  50 mg Oral Daily  . pantoprazole  40 mg Oral Daily  . rosuvastatin  5 mg Oral Daily  . sodium chloride flush  3 mL Intravenous Q12H  . sucralfate  1 g Oral TID WC & HS   Continuous Infusions: . sodium chloride    . cefTRIAXone (ROCEPHIN)  IV       LOS: 0 days    Time spent: > 35 minutes  Velvet Bathe, MD [facility] Pager [phone]  If 7PM-7AM, please contact night-coverage [email or web address] Password [facility] 12/18/2017, 4:25 PM

## 2017-12-19 DIAGNOSIS — F419 Anxiety disorder, unspecified: Secondary | ICD-10-CM

## 2017-12-19 DIAGNOSIS — F0280 Dementia in other diseases classified elsewhere without behavioral disturbance: Secondary | ICD-10-CM | POA: Diagnosis not present

## 2017-12-19 DIAGNOSIS — G301 Alzheimer's disease with late onset: Secondary | ICD-10-CM

## 2017-12-19 DIAGNOSIS — K219 Gastro-esophageal reflux disease without esophagitis: Secondary | ICD-10-CM

## 2017-12-19 DIAGNOSIS — N390 Urinary tract infection, site not specified: Secondary | ICD-10-CM | POA: Diagnosis not present

## 2017-12-19 DIAGNOSIS — R4182 Altered mental status, unspecified: Secondary | ICD-10-CM | POA: Diagnosis not present

## 2017-12-19 LAB — URINE CULTURE: Special Requests: NORMAL

## 2017-12-19 MED ORDER — CEPHALEXIN 500 MG PO CAPS
500.0000 mg | ORAL_CAPSULE | Freq: Three times a day (TID) | ORAL | Status: DC
  Administered 2017-12-19 – 2017-12-20 (×4): 500 mg via ORAL
  Filled 2017-12-19 (×4): qty 1

## 2017-12-19 MED ORDER — POLYETHYLENE GLYCOL 3350 17 G PO PACK
17.0000 g | PACK | Freq: Every day | ORAL | Status: DC
  Administered 2017-12-19 – 2017-12-20 (×2): 17 g via ORAL
  Filled 2017-12-19 (×2): qty 1

## 2017-12-19 MED ORDER — BISACODYL 5 MG PO TBEC: 5.0000 mg | DELAYED_RELEASE_TABLET | Freq: Every day | ORAL | Status: DC | PRN

## 2017-12-19 NOTE — Progress Notes (Signed)
Physical Therapy Treatment Patient Details Name: [name] MRN: [id number] DOB: May 31, 1926 Today's Date: 12/19/2017    History of Present Illness 84 y.o. female with PMH of GERD, HTN, HLD, frequent UTI, and B12 deficiency presents to the emergency department for evaluation of suprapubic abdominal pain with generalized fatigue and weakness    PT Comments    Pt continues weak and very unsteady with ambulation.  Pt progressed to ambulate short distance into hallway this date..   Follow Up Recommendations  SNF;Supervision/Assistance - 24 hour;Supervision for mobility/OOB     Equipment Recommendations  Wheelchair (measurements PT);Wheelchair cushion (measurements PT)    Recommendations for Other Services       Precautions / Restrictions Precautions Precautions: Fall Precaution Comments: pt vague historian, did admit to some falls in past year but unable to state how many or when they occurred Restrictions Weight Bearing Restrictions: No    Mobility  Bed Mobility Overal bed mobility: Needs Assistance Bed Mobility: Supine to Sit     Supine to sit: Min guard     General bed mobility comments: Increased time, HOB elevated, use of bedrail  Transfers Overall transfer level: Needs assistance Equipment used: Rolling walker (2 wheeled) Transfers: Sit to/from Stand Sit to Stand: Mod assist         General transfer comment: assist to rise and steady and balance in standing  Ambulation/Gait Ambulation/Gait assistance: Mod assist;+2 safety/equipment Ambulation Distance (Feet): 15 Feet Assistive device: Rolling walker (2 wheeled) Gait Pattern/deviations: Step-to pattern;Step-through pattern;Decreased step length - right;Decreased step length - left;Shuffle;Trunk flexed Gait velocity: decr   General Gait Details: Pt very unsteady with mild buckling at knees.  Cues for posture, position from RW   Stairs             Wheelchair Mobility    Modified Rankin (Stroke  Patients Only)       Balance Overall balance assessment: Needs assistance Sitting-balance support: Feet supported Sitting balance-Leahy Scale: Fair     Standing balance support: Bilateral upper extremity supported Standing balance-Leahy Scale: Poor                              Cognition Arousal/Alertness: Awake/alert Behavior During Therapy: WFL for tasks assessed/performed Overall Cognitive Status: No family/caregiver present to determine baseline cognitive functioning                                 General Comments: pt oriented to location, self, and situation. Able to follow commands.       Exercises      General Comments        Pertinent Vitals/Pain Pain Assessment: No/denies pain    Home Living                      Prior Function            PT Goals (current goals can now be found in the care plan section) Acute Rehab PT Goals Patient Stated Goal: pt agreeable to SNF -prefers [facility] PT Goal Formulation: With patient Time For Goal Achievement: 01/01/18 Potential to Achieve Goals: Fair Progress towards PT goals: Progressing toward goals    Frequency    Min 2X/week      PT Plan Current plan remains appropriate    Co-evaluation              AM-PAC PT "6  Clicks" Daily Activity  Outcome Measure  Difficulty turning over in bed (including adjusting bedclothes, sheets and blankets)?: A Lot Difficulty moving from lying on back to sitting on the side of the bed? : A Lot Difficulty sitting down on and standing up from a chair with arms (e.g., wheelchair, bedside commode, etc,.)?: Unable Help needed moving to and from a bed to chair (including a wheelchair)?: A Lot Help needed walking in hospital room?: A Lot Help needed climbing 3-5 steps with a railing? : Total 6 Click Score: 10    End of Session Equipment Utilized During Treatment: Gait belt Activity Tolerance: Patient limited by fatigue Patient left: in  chair;with call bell/phone within reach;with chair alarm set Nurse Communication: Mobility status PT Visit Diagnosis: Unsteadiness on feet (R26.81);Muscle weakness (generalized) (M62.81)     Time: 2111-7356 PT Time Calculation (min) (ACUTE ONLY): 27 min  Charges:  $Gait Training: 8-22 mins $Therapeutic Activity: 8-22 mins                    G Codes:       Pg [phone]    Zakiya Sporrer 12/19/2017, 12:57 PM

## 2017-12-19 NOTE — NC FL2 (Signed)
[place] MEDICAID FL2 LEVEL OF CARE SCREENING TOOL     IDENTIFICATION  Patient Name: [name] Birthdate: Mar 25, 1926 Sex: female Admission Date (Current Location): 12/16/2017  [facility] and [place] Number:  Herbalist and Address:  [facility],  [place] [address], [place]      Provider Number: [id number]  Attending Physician Name and Address:  Cristal Ford, DO  Relative Name and Phone Number:       Current Level of Care: Hospital Recommended Level of Care: [place] Prior Approval Number:    Date Approved/Denied:   PASRR Number:    Discharge Plan: SNF    Current Diagnoses: Patient Active Problem List   Diagnosis Date Noted  . Altered mental status 12/16/2017  . Diarrhea 09/19/2017  . Constipation 09/12/2017  . Alzheimer disease 07/25/2017  . Neck pain on left side 07/25/2017  . Urinary tract infection, recurrent 06/12/2017  . Thrush 02/10/2017  . Frequent urination 02/10/2017  . Osteoporosis 11/23/2016  . Abdominal pain 08/16/2016  . Urinary frequency 08/16/2016  . Peripheral edema 08/16/2016  . General weakness 08/16/2016  . Gait disorder 08/16/2016  . Dysuria 08/03/2016  . Closed displaced simple supracondylar fracture of right humerus without intercondylar fracture 05/10/2016  . Urinary tract infection without hematuria 05/10/2016  . Syncope 05/10/2016  . Humeral head fracture, right, closed, initial encounter   . Burning tongue 03/25/2016  . Post concussion syndrome 03/03/2016  . Fall 03/03/2016  . Cystocele with prolapse 03/05/2015  . Dehydration 02/08/2015  . Hyponatremia 02/08/2015  . Well adult exam 10/01/2014  . Grief at loss of child 12/11/2013  . Irregular heart beat 09/29/2013  . Humeral head fracture 08/26/2013  . Actinic keratoses 08/26/2013  . Carotid bruit 05/02/2012  . Anxiety 03/17/2011  . ANEMIA, PERNICIOUS, HX OF 10/10/2007  . Peripheral neuropathy ([place]) 09/04/2007  . Chronic  fatigue 08/06/2007  . Chronic lymphocytic leukemia ([place]) 06/17/2007  . Allergic rhinitis 06/17/2007  . OSTEOARTHRITIS 06/17/2007  . B12 deficiency 06/07/2007  . GERD (gastroesophageal reflux disease) 04/30/2007  . Dyslipidemia 12/19/2006  . Essential hypertension 12/19/2006  . DIVERTICULOSIS, COLON 05/19/2003  . COLONIC POLYPS, HX OF 01/10/2000    Orientation RESPIRATION BLADDER Height & Weight     Self, Time, Situation, Place  Normal Continent Weight: 130 lb (59 kg) Height:  5\' 4"  (162.6 cm)  BEHAVIORAL SYMPTOMS/MOOD NEUROLOGICAL BOWEL NUTRITION STATUS      Continent Diet(HEART HEALTHY DIET)  AMBULATORY STATUS COMMUNICATION OF NEEDS Skin   Extensive Assist Verbally Normal                       Personal Care Assistance Level of Assistance  Bathing, Feeding, Dressing Bathing Assistance: Maximum assistance Feeding assistance: Independent Dressing Assistance: Limited assistance     Functional Limitations Info  Sight, Hearing, Speech Sight Info: Adequate Hearing Info: Adequate Speech Info: Adequate    SPECIAL CARE FACTORS FREQUENCY  PT (By licensed PT), OT (By licensed OT)     PT Frequency: 5x OT Frequency: 5x            Contractures Contractures Info: Not present    Additional Factors Info  Code Status, Allergies Code Status Info: full code Allergies Info:  Pneumococcal Vaccines, Clarithromycin, Hctz Hydrochlorothiazide, Oxycodone-aspirin, Propoxyphene N-acetaminophen           Current Medications (12/19/2017):  This is the current hospital active medication list Current Facility-Administered Medications  Medication Dose Route Frequency Provider Last Rate Last  Dose  . 0.9 %  sodium chloride infusion  250 mL Intravenous PRN Velvet Bathe, MD      . acetaminophen (TYLENOL) tablet 500 mg  500 mg Oral BID Velvet Bathe, MD   500 mg at 12/18/17 2248  . aspirin EC tablet 81 mg  81 mg Oral Daily Velvet Bathe, MD   81 mg at 12/18/17 0931  . bisacodyl  (DULCOLAX) EC tablet 5 mg  5 mg Oral Daily PRN Cristal Ford, DO      . cephALEXin (KEFLEX) capsule 500 mg  500 mg Oral Q8H Mikhail, [place], DO      . cholecalciferol (VITAMIN D) tablet 2,000 Units  2,000 Units Oral Daily Velvet Bathe, MD   2,000 Units at 12/18/17 [phone]  . memantine (NAMENDA XR) 24 hr capsule 28 mg  28 mg Oral QHS Velvet Bathe, MD   28 mg at 12/18/17 2255   And  . donepezil (ARICEPT) tablet 10 mg  10 mg Oral Loraine Leriche, MD   10 mg at 12/18/17 2247  . feeding supplement (ENSURE ENLIVE) (ENSURE ENLIVE) liquid 237 mL  237 mL Oral TID BM Velvet Bathe, MD   237 mL at 12/18/17 2036  . fluticasone (FLONASE) 50 MCG/ACT nasal spray 2 spray  2 spray Each Nare Daily Velvet Bathe, MD   2 spray at 12/18/17 0932  . folic acid (FOLVITE) tablet 1 mg  1 mg Oral Daily Velvet Bathe, MD   1 mg at 12/18/17 0932  . gabapentin (NEURONTIN) capsule 300 mg  300 mg Oral Daily Velvet Bathe, MD   300 mg at 12/18/17 0931  . heparin injection 5,000 Units  5,000 Units Subcutaneous Q8H Velvet Bathe, MD   5,000 Units at 12/19/17 0557  . latanoprost (XALATAN) 0.005 % ophthalmic solution 1 drop  1 drop Both Eyes QHS Velvet Bathe, MD   1 drop at 12/18/17 2248  . mirabegron ER (MYRBETRIQ) tablet 50 mg  50 mg Oral Daily Velvet Bathe, MD   50 mg at 12/18/17 0930  . pantoprazole (PROTONIX) EC tablet 40 mg  40 mg Oral Daily Velvet Bathe, MD   40 mg at 12/18/17 0932  . polyethylene glycol (MIRALAX / GLYCOLAX) packet 17 g  17 g Oral Daily Mikhail, [place], DO      . rosuvastatin (CRESTOR) tablet 5 mg  5 mg Oral Daily Velvet Bathe, MD   5 mg at 12/18/17 0931  . sodium chloride flush (NS) 0.9 % injection 3 mL  3 mL Intravenous Q12H Velvet Bathe, MD   3 mL at 12/18/17 2301  . sodium chloride flush (NS) 0.9 % injection 3 mL  3 mL Intravenous PRN Velvet Bathe, MD      . sucralfate (CARAFATE) tablet 1 g  1 g Oral TID WC & HS Velvet Bathe, MD   1 g at 12/19/17 0856  . traMADol (ULTRAM) tablet 50 mg  50 mg  Oral Q6H PRN Vertis Kelch, NP   50 mg at 12/18/17 5188     Discharge Medications: Please see discharge summary for a list of discharge medications.  Relevant Imaging Results:  Relevant Lab Results:   Additional Information ss#[id number]  Nila Nephew, LCSW

## 2017-12-19 NOTE — Clinical Social Work Note (Addendum)
[place] must representative located pt's PASRR #[id number]   Clinical Social Work Assessment  Patient Details  Name: [name] MRN: [id number] Date of Birth: February 26, 1926  Date of referral:  12/19/17               Reason for consult:  Facility Placement                Permission sought to share information with:  Family Supports Permission granted to share information::  Yes, Verbal Permission Granted  Name::     son [name] and grandson Stage manager::     Relationship::     Contact Information:     Housing/Transportation Living arrangements for the past 2 months:  Single Family Home Source of Information:  Patient, Adult Children Patient Interpreter Needed:  None Criminal Activity/Legal Involvement Pertinent to Current Situation/Hospitalization:  No - Comment as needed Significant Relationships:  Adult Children, Other Family Members Lives with:  Self, Adult Children Do you feel safe going back to the place where you live?  Yes Need for family participation in patient care:  Yes (Comment)(son and grandson involved)  Care giving concerns:  Pt admitted from home where she technically resides alone however in past few weeks son has been staying with her as "she has not been feeling well and needed a little extra help." At baseline uses walker independently, needs help into shower (has shower chair). Currently admitted under observation for UTI. Pt also mentions she "broke her pelvis a while back."   Facilities manager / plan:  CSW consulted to assist with SNF placement. Met with pt at bedside- she was alert and oriented x 4. (was confused upon admission) She is familiar with nature of short term rehab at SNF as she has been to [facility] twice in the past (last time in December 2018). Prefers [facility] again if available. CSW completed FL2 and made referrals- [facility] accepted and started insurance authorization request. Updated grandson (unable to reach son but grandson states he  will inform him). Unable to locate pt in [place] Must system to verify PASRR- will call [place] Must to rectify.  Plan: SNF ([facility]) for short term rehab at [place] Barriers: PASRR, insurance auth   Employment status:  Retired Engineer, maintenance (IT)) PT Recommendations:  [place], [place] / Referral to community resources:  [place]  Patient/Family's Response to care:  appreciative  Patient/Family's Understanding of and Emotional Response to Diagnosis, Current Treatment, and Prognosis:  Pt demonstrates good understanding of her treatment her- also fairly good historian. Emotionally positive -"Rehab has helped me a lot before and I'd like to get back to being able to get up to my walker again."  Emotional Assessment Appearance:  Appears stated age Attitude/Demeanor/Rapport:  Engaged Affect (typically observed):  Accepting, Pleasant Orientation:  Oriented to Self, Oriented to Place, Oriented to  Time, Oriented to Situation Alcohol / Substance use:  Not Applicable Psych involvement (Current and /or in the community):  No (Comment)  Discharge Needs  Concerns to be addressed:  Discharge Planning Concerns Readmission within the last 30 days:  No Current discharge risk:  Dependent with Mobility Barriers to Discharge:  Continued Medical Work up, [employer], LCSW 12/19/2017, 10:05 AM  [phone]

## 2017-12-19 NOTE — Progress Notes (Signed)
PROGRESS NOTE    [name]  RXV:[id number] DOB: 04-16-1926 DOA: 12/16/2017 PCP: Cassandria Anger, MD   Brief Narrative:  HPI On 12/16/2017 by Dr. Velvet Bathe [name] is a 84 y.o. female with medical history significant of with 3-day complaint of above problems.  Reportedly recently treated with Keflex by primary care physician for UTI.  Patient presenting with these problems.  History is obtained from son and ER physician as patient is unable to provide history secondary to dementia.  The problem has been persistent is not getting any better.  The problem has been gradually getting worse.  Interim history Found to have UTI. Mental status improving. Pending SNF.  Assessment & Plan   Urinary tract infection -Patient states she has been having frequent urinary tract infections and is supposed to follow-up with a specialist in June. -UA: Large leukocytes, positive nitrites, many bacteria, 21-50 WBC -Urine culture: >100K Klebsiella oxytoca -Was placed on ceftriaxone, will transition to Keflex -Patient currently afebrile with no leukocytosis -Discussed hygiene habits -Continue Myrbetriq for overactive bladder  Acute metabolic encephalopathy -Appears to be improving; patient currently alert and oriented x3 -Suspect secondary to urinary tract infection  GERD -Continue PPI  Alzheimer's disease -Stable, continue Aricept, Namenda  Hyperlipidemia -Continue statin  Peripheral neuropathy -Continue gabapentin  Constipation -Will order miralax  Glaucoma -Continue eyedrops  DVT Prophylaxis heparin  Code Status: Full  Family Communication: Son at bedside  Disposition Plan: Observation.  Pending SNF placement.  Consultants None  Procedures  None  Antibiotics   Anti-infectives (From admission, onward)   Start     Dose/Rate Route Frequency Ordered Stop   12/19/17 1000  cephALEXin (KEFLEX) capsule 500 mg     500 mg Oral Every 8 hours 12/19/17 0827     12/17/17 1800  cefTRIAXone (ROCEPHIN) 1 g in sodium chloride 0.9 % 100 mL IVPB  Status:  Discontinued     1 g 200 mL/hr over 30 Minutes Intravenous Every 24 hours 12/16/17 1728 12/19/17 0827   12/16/17 1715  cefTRIAXone (ROCEPHIN) 1 g in sodium chloride 0.9 % 100 mL IVPB     1 g 200 mL/hr over 30 Minutes Intravenous  Once 12/16/17 1707 12/16/17 1821      Subjective:   [name] seen and examined today.  Feeling better today.  Denies current chest pain, shortness of breath, nausea, vomiting, diarrhea.  Does have some right lower quadrant abdominal pain which she attributes to possibly being constipated.  Patient states is been 3 days since her last bowel movement. Objective:   Vitals:   12/18/17 0525 12/18/17 1223 12/18/17 2227 12/19/17 0452  BP: 134/74 (!) 172/78 (!) 147/72 (!) 143/77  Pulse: 72 73 72 89  Resp: 16 14 16 16   Temp: 98.3 F (36.8 C) 97.8 F (36.6 C) 98.2 F (36.8 C) 97.8 F (36.6 C)  TempSrc: Oral Oral Oral Oral  SpO2: 94% 95% 96% 95%  Weight:      Height:        Intake/Output Summary (Last 24 hours) at 12/19/2017 1448 Last data filed at 12/19/2017 0300 Gross per 24 hour  Intake 220 ml  Output 420 ml  Net -200 ml   Filed Weights   12/16/17 1337  Weight: 59 kg (130 lb)    Exam  General: Well developed, well nourished, NAD, appears stated age  HEENT: NCAT, mucous membranes moist.   Neck: Supple  Cardiovascular: S1 S2 auscultated, no rubs, murmurs or gallops. Regular rate and rhythm.  Respiratory: Clear to auscultation bilaterally with equal chest rise  Abdomen: Soft, nontender, nondistended, + bowel sounds  Extremities: warm dry without cyanosis clubbing or edema  Neuro: AAOx3, nonfocal  Psych: Normal affect and demeanor, pleasant   Data Reviewed: I have personally reviewed following labs and imaging studies  CBC: Recent Labs  Lab 12/16/17 1418 12/17/17 0439  WBC 5.3 7.6  HGB 14.7 14.5  HCT 41.7 42.2  MCV 90.5 90.2  PLT 189 124    Basic Metabolic Panel: Recent Labs  Lab 12/16/17 1418 12/16/17 1921 12/17/17 0439  NA 132* 134* 134*  K 3.8 3.6 3.6  CL 93* 96* 98*  CO2 28 27 25   GLUCOSE 108* 146* 89  BUN 14 13 15   CREATININE 0.66 0.70 0.74  CALCIUM 9.2 9.0 8.8*   GFR: Estimated Creatinine Clearance: 41.2 mL/min (by C-G formula based on SCr of 0.74 mg/dL). Liver Function Tests: No results for input(s): AST, ALT, ALKPHOS, BILITOT, PROT, ALBUMIN in the last 168 hours. No results for input(s): LIPASE, AMYLASE in the last 168 hours. No results for input(s): AMMONIA in the last 168 hours. Coagulation Profile: No results for input(s): INR, PROTIME in the last 168 hours. Cardiac Enzymes: No results for input(s): CKTOTAL, CKMB, CKMBINDEX, TROPONINI in the last 168 hours. BNP (last 3 results) No results for input(s): PROBNP in the last 8760 hours. HbA1C: No results for input(s): HGBA1C in the last 72 hours. CBG: Recent Labs  Lab 12/16/17 1357  GLUCAP 109*   Lipid Profile: No results for input(s): CHOL, HDL, LDLCALC, TRIG, CHOLHDL, LDLDIRECT in the last 72 hours. Thyroid Function Tests: No results for input(s): TSH, T4TOTAL, FREET4, T3FREE, THYROIDAB in the last 72 hours. Anemia Panel: No results for input(s): VITAMINB12, FOLATE, FERRITIN, TIBC, IRON, RETICCTPCT in the last 72 hours. Urine analysis:    Component Value Date/Time   COLORURINE AMBER (A) 12/16/2017 1633   APPEARANCEUR CLEAR 12/16/2017 1633   LABSPEC 1.010 12/16/2017 1633   LABSPEC 1.010 04/03/2017 0950   PHURINE 7.0 12/16/2017 [place] 12/16/2017 1633   GLUCOSEU NEGATIVE 09/25/2017 1030   GLUCOSEU Negative 04/03/2017 0950   HGBUR SMALL (A) 12/16/2017 1633   HGBUR trace-intact 07/07/2008 1058   BILIRUBINUR NEGATIVE 12/16/2017 1633   BILIRUBINUR negative 11/16/2017 1653   BILIRUBINUR neg 11/02/2017 1524   BILIRUBINUR Negative 04/03/2017 0950   KETONESUR NEGATIVE 12/16/2017 1633   PROTEINUR NEGATIVE 12/16/2017 1633    UROBILINOGEN 0.2 11/16/2017 1653   UROBILINOGEN 0.2 09/25/2017 1030   UROBILINOGEN 0.2 04/03/2017 0950   NITRITE POSITIVE (A) 12/16/2017 1633   LEUKOCYTESUR LARGE (A) 12/16/2017 1633   LEUKOCYTESUR Trace 04/03/2017 0950   Sepsis Labs: @LABRCNTIP (procalcitonin:4,lacticidven:4)  ) Recent Results (from the past 240 hour(s))  Urine culture     Status: Abnormal   Collection Time: 12/16/17  4:33 PM  Result Value Ref Range Status   Specimen Description   Final    URINE, CATHETERIZED Performed at [facility] [address] [place], [place] [address]    Special Requests   Final    Normal Performed at [facility], [place] [address], [place], [place] [address]    Culture >=100,000 COLONIES/mL KLEBSIELLA OXYTOCA (A)  Final   Report Status 12/19/2017 FINAL  Final   Organism ID, Bacteria KLEBSIELLA OXYTOCA (A)  Final      Susceptibility   Klebsiella oxytoca - MIC*    AMPICILLIN >=32 RESISTANT Resistant     CEFAZOLIN 16 SENSITIVE Sensitive  CEFTRIAXONE <=1 SENSITIVE Sensitive     CIPROFLOXACIN <=0.25 SENSITIVE Sensitive     GENTAMICIN <=1 SENSITIVE Sensitive     IMIPENEM <=0.25 SENSITIVE Sensitive     NITROFURANTOIN 32 SENSITIVE Sensitive     TRIMETH/SULFA <=20 SENSITIVE Sensitive     AMPICILLIN/SULBACTAM 16 INTERMEDIATE Intermediate     PIP/TAZO <=4 SENSITIVE Sensitive     Extended ESBL NEGATIVE Sensitive     * >=100,000 COLONIES/mL KLEBSIELLA OXYTOCA      Radiology Studies: No results found.   Scheduled Meds: . acetaminophen  500 mg Oral BID  . aspirin EC  81 mg Oral Daily  . cephALEXin  500 mg Oral Q8H  . cholecalciferol  2,000 Units Oral Daily  . memantine  28 mg Oral QHS   And  . donepezil  10 mg Oral QHS  . feeding supplement (ENSURE ENLIVE)  237 mL Oral TID BM  . fluticasone  2 spray Each Nare Daily  . folic acid  1 mg Oral Daily  . gabapentin  300 mg Oral Daily  . heparin  5,000 Units Subcutaneous Q8H  . latanoprost  1  drop Both Eyes QHS  . mirabegron ER  50 mg Oral Daily  . pantoprazole  40 mg Oral Daily  . polyethylene glycol  17 g Oral Daily  . rosuvastatin  5 mg Oral Daily  . sodium chloride flush  3 mL Intravenous Q12H  . sucralfate  1 g Oral TID WC & HS   Continuous Infusions: . sodium chloride       LOS: 0 days   Time Spent in minutes   45 minutes  Floris Neuhaus D.O. on 12/19/2017 at 2:48 PM  Between 7am to 7pm - Pager - [phone]  After 7pm go to [email or web address] - password [email or web address]  And look for the night coverage person covering for me after hours  [facility] Office  [phone]

## 2017-12-19 NOTE — Care Management Obs Status (Signed)
[place] NOTIFICATION   Patient Details  Name: MARGUETTA WINDISH MRN: [id number] Date of Birth: 1926-01-01   Medicare Observation Status Notification Given:  Yes    Lynnell Catalan, RN 12/19/2017, 1:08 PM

## 2017-12-20 ENCOUNTER — Inpatient Hospital Stay: Payer: Medicare Other

## 2017-12-20 DIAGNOSIS — K219 Gastro-esophageal reflux disease without esophagitis: Secondary | ICD-10-CM | POA: Diagnosis not present

## 2017-12-20 DIAGNOSIS — G301 Alzheimer's disease with late onset: Secondary | ICD-10-CM | POA: Diagnosis not present

## 2017-12-20 DIAGNOSIS — F419 Anxiety disorder, unspecified: Secondary | ICD-10-CM | POA: Diagnosis not present

## 2017-12-20 DIAGNOSIS — R4182 Altered mental status, unspecified: Secondary | ICD-10-CM | POA: Diagnosis not present

## 2017-12-20 MED ORDER — HYDROMORPHONE BOLUS VIA INFUSION
2.0000 mg | Freq: Once | INTRAVENOUS | Status: DC
  Filled 2017-12-20: qty 2

## 2017-12-20 MED ORDER — CEPHALEXIN 500 MG PO CAPS: 500.0000 mg | ORAL_CAPSULE | Freq: Three times a day (TID) | ORAL | 0 refills | Status: DC

## 2017-12-20 MED ORDER — HYDROMORPHONE BOLUS VIA INFUSION: 2.0000 mg | Freq: Once | INTRAVENOUS | Status: DC

## 2017-12-20 MED ORDER — ENSURE ENLIVE PO LIQD
237.0000 mL | Freq: Three times a day (TID) | ORAL | Status: DC
Start: 2017-12-20 — End: 2019-02-10

## 2017-12-20 MED ORDER — POLYETHYLENE GLYCOL 3350 17 G PO PACK: 17.0000 g | PACK | Freq: Every day | ORAL | 0 refills | Status: AC

## 2017-12-20 MED ORDER — BISACODYL 10 MG RE SUPP
10.0000 mg | Freq: Once | RECTAL | Status: AC
  Administered 2017-12-20: 10 mg via RECTAL
  Filled 2017-12-20: qty 1

## 2017-12-20 MED ORDER — HYDROMORPHONE HCL 1 MG/ML IJ SOLN: 1.0000 mg | Freq: Once | INTRAMUSCULAR | Status: DC

## 2017-12-20 MED ORDER — MAGIC MOUTHWASH
5.0000 mL | Freq: Once | ORAL | Status: AC
  Administered 2017-12-20: 5 mL via ORAL
  Filled 2017-12-20: qty 5

## 2017-12-20 NOTE — Discharge Summary (Addendum)
Physician Discharge Summary  [name] [id number] DOB: 1926/02/04 DOA: 12/16/2017  PCP: Cassandria Anger, MD  Admit date: 12/16/2017 Discharge date: 12/20/2017  Time spent: 45 minutes  Recommendations for Outpatient Follow-up:  Patient will be discharged to skilled nursing facility. Continue physical and occupational therapy.  Patient will need to follow up with primary care provider within one week of discharge.  Follow up with your urology appointment in June. Patient should continue medications as prescribed.  Patient should follow a heart healthy diet.   Discharge Diagnoses:  Urinary tract infection Acute metabolic encephalopathy GERD Alzheimer's disease Hyperlipidemia Peripheral neuropathy Constipation Glaucoma  Discharge Condition: Stable  Diet recommendation: heart healthy  Filed Weights   12/16/17 1337  Weight: 59 kg (130 lb)    History of present illness:  On 12/16/2017 by Dr. Larae Grooms Pegramis a 84 y.o.femalewith medical history significant ofwith 3-day complaint of above problems. Reportedly recently treated with Keflex by primary care physician for UTI. Patient presenting with these problems. History is obtained from son and ER physician as patient is unable to provide history secondary to dementia. The problem has been persistent is not getting any better. The problem has been gradually getting worse.  Hospital Course:  Urinary tract infection -Patient states she has been having frequent urinary tract infections and is supposed to follow-up with a specialist in June. -UA: Large leukocytes, positive nitrites, many bacteria, 21-50 WBC -Urine culture: >100K Klebsiella oxytoca -Was placed on ceftriaxone, transitioned to Keflex, continue for additional 3 days -Patient currently afebrile with no leukocytosis -Discussed hygiene habits -Continue Myrbetriq for overactive bladder  Acute metabolic encephalopathy -Appears to be improving;  patient currently alert and oriented x3 -patient with underlying dementia -Suspect secondary to urinary tract infection  GERD -Continue PPI  Alzheimer's disease -Stable, continue Aricept, Namenda  Hyperlipidemia -Continue statin  Peripheral neuropathy -Continue gabapentin  Constipation -given miralax  Glaucoma -Continue eyedrops  Code status: DNR  Procedures:  None  Consultations:  None  Discharge Exam: Vitals:   12/19/17 2107 12/20/17 0513  BP: 134/66 136/68  Pulse: 83 75  Resp: 16 16  Temp: 97.7 F (36.5 C) 97.6 F (36.4 C)  SpO2: 96% 96%   Patient denies any current chest pain, shortness breath, abdominal pain, nausea or vomiting, headache or dizziness.  States she is feeling mildly better but feels very weak.   General: Well developed, elderly, no apparent distress  HEENT: NCAT, mucous membranes moist.  Neck: Supple  Cardiovascular: S1 S2 auscultated, no rubs, murmurs or gallops. Regular rate and rhythm.  Respiratory: Clear to auscultation bilaterally with equal chest rise  Abdomen: Soft, nontender, nondistended, + bowel sounds  Extremities: warm dry without cyanosis clubbing or edema  Neuro: AAOx3, history of dementia, nonfocal  Psych: appropriate mood and affect  Discharge Instructions Discharge Instructions    Discharge instructions   Complete by:  As directed    Patient will be discharged to skilled nursing facility. Continue physical and occupational therapy.  Patient will need to follow up with primary care provider within one week of discharge.  Follow up with your urology appointment in June. Patient should continue medications as prescribed.  Patient should follow a heart healthy diet.     Allergies as of 12/20/2017      Reactions   Pneumococcal Vaccines Swelling, Rash   Pneumococcal Vaccine-23 valent   Clarithromycin Other (See Comments)   REACTION: sore mouth   Hctz [hydrochlorothiazide]    Low Na   Oxycodone-aspirin  Other (  See Comments)   REACTION: horrible nightmares   Propoxyphene N-acetaminophen Other (See Comments)   REACTION: couldn't wake her up      Medication List    TAKE these medications   acetaminophen 500 MG tablet Commonly known as:  TYLENOL Take 500 mg by mouth 2 (two) times daily.   aspirin 81 MG EC tablet Take 81 mg by mouth daily.   B-D INS SYRINGE 0.5CC/30GX1/2" 30G X 1/2" 0.5 ML Misc Generic drug:  Insulin Syringe-Needle U-100 USE TO ADMINISTER B12 INJECTIONS   BD INSULIN SYRINGE U/F 30G X 1/2" 0.5 ML Misc Generic drug:  Insulin Syringe-Needle U-100 USE TO ADMINISTER B12 INJECTIONS   CENTRUM SILVER 50+WOMEN Tabs Take 1 tablet by mouth daily.   cephALEXin 500 MG capsule Commonly known as:  KEFLEX Take 1 capsule (500 mg total) by mouth every 8 (eight) hours. What changed:  when to take this   clotrimazole-betamethasone cream Commonly known as:  LOTRISONE Apply 1 application topically 2 (two) times daily.   conjugated estrogens vaginal cream Commonly known as:  PREMARIN 1/2 GRAM VAGINALLY EVERY NIGHT X 1 WEEK, THEN TWICE WEEKLY AT BEDTIME   cromolyn 4 % ophthalmic solution Commonly known as:  OPTICROM 1 DROP IN BOTH EYES FOUR TIMES A DAY   cyanocobalamin 1000 MCG/ML injection Commonly known as:  (VITAMIN B-12) ADMINISTER 1 CC SUBQ EVERY 4 WEEKS AS DIRECTED   Dexlansoprazole 30 MG capsule Commonly known as:  DEXILANT Take 1 capsule (30 mg total) by mouth daily.   feeding supplement (ENSURE ENLIVE) Liqd Take 237 mLs by mouth 3 (three) times daily between meals.   fluconazole 150 MG tablet Commonly known as:  DIFLUCAN TAKE 1 TABLET (150 MG TOTAL) BY MOUTH ONCE FOR 1 DOSE. REPEAT IN 1 WEEK IF NEEDED.   fluticasone 50 MCG/ACT nasal spray Commonly known as:  FLONASE Place 2 sprays into both nostrils daily.   folic acid 1 MG tablet Commonly known as:  FOLVITE Take 1 mg by mouth daily. For restless legs   gabapentin 300 MG capsule Commonly known as:   NEURONTIN TAKE 1 CAPSULE (300 MG TOTAL) BY MOUTH DAILY.   GINSENG PO Take 1 tablet by mouth daily.   GRAPE SEED PO Take 1 tablet by mouth daily.   hydrochlorothiazide 12.5 MG capsule Commonly known as:  MICROZIDE Take 1 capsule (12.5 mg total) by mouth daily. For blood pressure   KLOR-CON M10 10 MEQ tablet Generic drug:  potassium chloride TAKE 1 TABLET (10 MEQ TOTAL) BY MOUTH DAILY.   latanoprost 0.005 % ophthalmic solution Commonly known as:  XALATAN PLACE 1 DROP INTO BOTH EYES AT BEDTIME.   loratadine 10 MG tablet Commonly known as:  CLARITIN TAKE 1 TABLET (10 MG TOTAL) BY MOUTH DAILY.   magic mouthwash w/lidocaine Soln Take 5 mLs by mouth 3 (three) times daily as needed for mouth pain. Swish, hold, spit   MAGNESIUM CHLORIDE PO Take 1 tablet by mouth daily.   Memantine HCl-Donepezil HCl 28-10 MG Cp24 Take 1 capsule by mouth daily.   mirabegron ER 50 MG Tb24 tablet Commonly known as:  MYRBETRIQ Take 1 tablet (50 mg total) by mouth daily.   polyethylene glycol packet Commonly known as:  MIRALAX / GLYCOLAX Take 17 g by mouth daily. Start taking on:  12/21/2017   RABEprazole 20 MG tablet Commonly known as:  ACIPHEX Take 1 tablet (20 mg total) by mouth 2 (two) times daily.   rosuvastatin 5 MG tablet Commonly known as:  CRESTOR Take 1  tablet (5 mg total) by mouth daily.   sucralfate 1 g tablet Commonly known as:  CARAFATE Take 1 tablet (1 g total) by mouth 4 (four) times daily -  with meals and at bedtime.   triamcinolone ointment 0.1 % Commonly known as:  KENALOG Apply 1 application topically 2 (two) times daily.   Vitamin D3 2000 units capsule Take 1 capsule (2,000 Units total) by mouth daily.      Allergies  Allergen Reactions  . Pneumococcal Vaccines Swelling and Rash    Pneumococcal Vaccine-23 valent  . Clarithromycin Other (See Comments)    REACTION: sore mouth  . Hctz [Hydrochlorothiazide]     Low Na  . Oxycodone-Aspirin Other (See Comments)      REACTION: horrible nightmares  . Propoxyphene N-Acetaminophen Other (See Comments)    REACTION: couldn't wake her up   Follow-up Information    Plotnikov, Evie Lacks, MD. Schedule an appointment as soon as possible for a visit in 1 week(s).   Specialty:  Internal Medicine Why:  Hospital follow up Contact information: [place] [address] [phone]            The results of significant diagnostics from this hospitalization (including imaging, microbiology, ancillary and laboratory) are listed below for reference.    Significant Diagnostic Studies: Ct Abdomen Pelvis W Contrast  Result Date: 12/16/2017 CLINICAL DATA:  Weakness, lethargy, previous UTI,  suprapubic pain EXAM: CT ABDOMEN AND PELVIS WITH CONTRAST TECHNIQUE: Multidetector CT imaging of the abdomen and pelvis was performed using the standard protocol following bolus administration of intravenous contrast. CONTRAST:  142mL ISOVUE-300 IOPAMIDOL (ISOVUE-300) INJECTION 61% COMPARISON:  08/24/2016 FINDINGS: Lower chest: Scattered coronary calcifications. No pleural or pericardial effusion. Visualized lung bases clear. Hepatobiliary: 3cm low attenuation collection at the posterior margin of the right hepatic lobe, previously 2.7 cm. No new liver lesion. Gallbladder is nondistended. No biliary ductal dilatation. Pancreas: Unremarkable. No pancreatic ductal dilatation or surrounding inflammatory changes. Spleen: Normal in size without focal abnormality. Adrenals/Urinary Tract: Normal adrenal glands. Normal renal parenchymal enhancement. No hydronephrosis. There is mild right ureterectasis down to the ureteral orifice, without radiodense calculus. The urinary bladder is physiologically distended. Stomach/Bowel: Stomach is decompressed. Small bowel nondilated. Normal appendix. Moderate proximal colonic fecal material, decompressed distally. Vascular/Lymphatic: Scattered aortoiliac calcified plaque without aneurysm or  stenosis. Portal vein patent. Bilateral pelvic phleboliths. No abdominal or pelvic adenopathy. Reproductive: Status post hysterectomy. No adnexal masses. Other: No ascites.  No free air. Musculoskeletal: Spondylitic changes in the lower thoracic and lumbar spine. Thoracolumbar dextroscoliosis apex L2. Negative for fracture or worrisome bone lesion. IMPRESSION: 1. No acute findings. 2. Stable right ureterectasis without hydronephrosis or urolithiasis. 3. Coronary and aortoiliac  atherosclerosis (ICD10-170.0) Electronically Signed   By: Lucrezia Europe M.D.   On: 12/16/2017 16:03    Microbiology: Recent Results (from the past 240 hour(s))  Urine culture     Status: Abnormal   Collection Time: 12/16/17  4:33 PM  Result Value Ref Range Status   Specimen Description   Final    URINE, CATHETERIZED Performed at [facility] [address] [place], [place] [address]    Special Requests   Final    Normal Performed at [facility], [place] [address], [place], [place] [address]    Culture >=100,000 COLONIES/mL KLEBSIELLA OXYTOCA (A)  Final   Report Status 12/19/2017 FINAL  Final   Organism ID, Bacteria KLEBSIELLA OXYTOCA (A)  Final      Susceptibility  Klebsiella oxytoca - MIC*    AMPICILLIN >=32 RESISTANT Resistant     CEFAZOLIN 16 SENSITIVE Sensitive     CEFTRIAXONE <=1 SENSITIVE Sensitive     CIPROFLOXACIN <=0.25 SENSITIVE Sensitive     GENTAMICIN <=1 SENSITIVE Sensitive     IMIPENEM <=0.25 SENSITIVE Sensitive     NITROFURANTOIN 32 SENSITIVE Sensitive     TRIMETH/SULFA <=20 SENSITIVE Sensitive     AMPICILLIN/SULBACTAM 16 INTERMEDIATE Intermediate     PIP/TAZO <=4 SENSITIVE Sensitive     Extended ESBL NEGATIVE Sensitive     * >=100,000 COLONIES/mL KLEBSIELLA OXYTOCA     Labs: Basic Metabolic Panel: Recent Labs  Lab 12/16/17 1418 12/16/17 1921 12/17/17 0439  NA 132* 134* 134*  K 3.8 3.6 3.6  CL 93* 96* 98*  CO2 28 27 25   GLUCOSE 108* 146* 89  BUN  14 13 15   CREATININE 0.66 0.70 0.74  CALCIUM 9.2 9.0 8.8*   Liver Function Tests: No results for input(s): AST, ALT, ALKPHOS, BILITOT, PROT, ALBUMIN in the last 168 hours. No results for input(s): LIPASE, AMYLASE in the last 168 hours. No results for input(s): AMMONIA in the last 168 hours. CBC: Recent Labs  Lab 12/16/17 1418 12/17/17 0439  WBC 5.3 7.6  HGB 14.7 14.5  HCT 41.7 42.2  MCV 90.5 90.2  PLT 189 218   Cardiac Enzymes: No results for input(s): CKTOTAL, CKMB, CKMBINDEX, TROPONINI in the last 168 hours. BNP: BNP (last 3 results) No results for input(s): BNP in the last 8760 hours.  ProBNP (last 3 results) No results for input(s): PROBNP in the last 8760 hours.  CBG: Recent Labs  Lab 12/16/17 1357  GLUCAP 109*       Signed:  Talli Kimmer  [facility] 12/20/2017, 10:02 AM

## 2017-12-20 NOTE — Discharge Instructions (Signed)

## 2017-12-20 NOTE — Progress Notes (Signed)
Chaplain responding to code blue.  Code blue cancelled.  Chaplain available on unit for support needs.   Spiritual care to follow up with pt as care team has addressed needs.    [facility] / [facility] Chaplain Jerene Pitch MDiv, [facility]

## 2017-12-20 NOTE — Progress Notes (Signed)
PT was on the [facility] to have a BM  with a nursing assist with her. Megan NA observed the pt close her eyes , she was drooling out of her mouth and  verbalized 'I cant talk.' Staff arrived . Pt was hard to arouse and Limp.We moved her to the bed , she opened her eyes. She knew her name and BD, Hand grips were moderate,VSS were stable .Dr Jessee Avers was notified. Pt slept.  1700 Pt transported to [facility] [place] [facility] via [facility]. I called report to [facility]

## 2017-12-20 NOTE — Clinical Social Work Placement (Signed)
Pt discharged with plan to admit to [facility] for Short term rehab- report [phone] DC information provided via the [place] Medicare authorization. Pt's son at bedside aware of plan. Will arrange PTAR transportation  [place]  NOTE  Date:  12/20/2017  Patient Details  Name: [name] MRN: [id number] Date of Birth: Mar 24, 1926  Clinical Social Work is seeking post-discharge placement for this patient at the [place] level of care (*CSW will initial, date and re-position this form in  chart as items are completed):  Yes   Patient/family provided with [place] Work Department's list of facilities offering this level of care within the geographic area requested by the patient (or if unable, by the patient's family).  Yes   Patient/family informed of their freedom to choose among providers that offer the needed level of care, that participate in Medicare, Medicaid or managed care program needed by the patient, have an available bed and are willing to accept the patient.  Yes   Patient/family informed of [place]'s ownership interest in [facility] and [facility], as well as of the fact that they are under no obligation to receive care at these facilities.  PASRR submitted to EDS on       PASRR number received on       Existing PASRR number confirmed on 12/19/17     FL2 transmitted to all facilities in geographic area requested by pt/family on 12/19/17     FL2 transmitted to all facilities within larger geographic area on       Patient informed that his/her managed care company has contracts with or will negotiate with certain facilities, including the following:        Yes   Patient/family informed of bed offers received.  Patient chooses bed at [place], [place]     Physician recommends and patient chooses bed at [place], [place]    Patient to be  transferred to [place], [place] on 12/20/17.  Patient to be transferred to facility by PTAR     Patient family notified on 12/20/17 of transfer.  Name of family member notified:  son Elenore Rota     PHYSICIAN       Additional Comment:    _______________________________________________ Nila Nephew, LCSW 12/20/2017, 11:55 AM [phone]

## 2017-12-21 ENCOUNTER — Emergency Department (HOSPITAL_COMMUNITY): Payer: Medicare Other

## 2017-12-21 ENCOUNTER — Observation Stay (HOSPITAL_COMMUNITY)
Admission: EM | Admit: 2017-12-21 | Discharge: 2017-12-23 | Disposition: A | Discharge status: Home / Self Care | Payer: Medicare Other | Attending: Family Medicine | Admitting: Family Medicine

## 2017-12-21 ENCOUNTER — Telehealth: Payer: Self-pay | Admitting: *Deleted

## 2017-12-21 ENCOUNTER — Encounter (HOSPITAL_COMMUNITY): Payer: Self-pay | Admitting: Emergency Medicine

## 2017-12-21 ENCOUNTER — Other Ambulatory Visit: Payer: Self-pay

## 2017-12-21 DIAGNOSIS — F0390 Unspecified dementia without behavioral disturbance: Secondary | ICD-10-CM | POA: Diagnosis not present

## 2017-12-21 DIAGNOSIS — G609 Hereditary and idiopathic neuropathy, unspecified: Secondary | ICD-10-CM | POA: Diagnosis not present

## 2017-12-21 DIAGNOSIS — C9110 Chronic lymphocytic leukemia of B-cell type not having achieved remission: Secondary | ICD-10-CM | POA: Diagnosis present

## 2017-12-21 DIAGNOSIS — E785 Hyperlipidemia, unspecified: Secondary | ICD-10-CM | POA: Diagnosis not present

## 2017-12-21 DIAGNOSIS — D801 Nonfamilial hypogammaglobulinemia: Secondary | ICD-10-CM | POA: Insufficient documentation

## 2017-12-21 DIAGNOSIS — Z7982 Long term (current) use of aspirin: Secondary | ICD-10-CM | POA: Insufficient documentation

## 2017-12-21 DIAGNOSIS — K219 Gastro-esophageal reflux disease without esophagitis: Secondary | ICD-10-CM | POA: Diagnosis not present

## 2017-12-21 DIAGNOSIS — Y929 Unspecified place or not applicable: Secondary | ICD-10-CM | POA: Insufficient documentation

## 2017-12-21 DIAGNOSIS — D510 Vitamin B12 deficiency anemia due to intrinsic factor deficiency: Secondary | ICD-10-CM | POA: Diagnosis not present

## 2017-12-21 DIAGNOSIS — Z90710 Acquired absence of both cervix and uterus: Secondary | ICD-10-CM | POA: Insufficient documentation

## 2017-12-21 DIAGNOSIS — Z886 Allergy status to analgesic agent status: Secondary | ICD-10-CM | POA: Diagnosis not present

## 2017-12-21 DIAGNOSIS — C9190 Lymphoid leukemia, unspecified not having achieved remission: Secondary | ICD-10-CM | POA: Diagnosis not present

## 2017-12-21 DIAGNOSIS — K5900 Constipation, unspecified: Secondary | ICD-10-CM | POA: Insufficient documentation

## 2017-12-21 DIAGNOSIS — R3129 Other microscopic hematuria: Secondary | ICD-10-CM | POA: Diagnosis not present

## 2017-12-21 DIAGNOSIS — I10 Essential (primary) hypertension: Secondary | ICD-10-CM | POA: Diagnosis not present

## 2017-12-21 DIAGNOSIS — Z79899 Other long term (current) drug therapy: Secondary | ICD-10-CM | POA: Insufficient documentation

## 2017-12-21 DIAGNOSIS — S3011XA Contusion of abdominal wall, initial encounter: Secondary | ICD-10-CM | POA: Diagnosis present

## 2017-12-21 DIAGNOSIS — H409 Unspecified glaucoma: Secondary | ICD-10-CM | POA: Insufficient documentation

## 2017-12-21 DIAGNOSIS — Z66 Do not resuscitate: Secondary | ICD-10-CM | POA: Diagnosis not present

## 2017-12-21 DIAGNOSIS — N390 Urinary tract infection, site not specified: Secondary | ICD-10-CM | POA: Diagnosis not present

## 2017-12-21 DIAGNOSIS — G629 Polyneuropathy, unspecified: Secondary | ICD-10-CM

## 2017-12-21 DIAGNOSIS — Z885 Allergy status to narcotic agent status: Secondary | ICD-10-CM | POA: Diagnosis not present

## 2017-12-21 DIAGNOSIS — I700 Atherosclerosis of aorta: Secondary | ICD-10-CM | POA: Diagnosis not present

## 2017-12-21 DIAGNOSIS — X58XXXA Exposure to other specified factors, initial encounter: Secondary | ICD-10-CM | POA: Diagnosis not present

## 2017-12-21 DIAGNOSIS — S301XXA Contusion of abdominal wall, initial encounter: Principal | ICD-10-CM | POA: Insufficient documentation

## 2017-12-21 LAB — CBC WITH DIFFERENTIAL/PLATELET
BASOS PCT: 0 %
Basophils Absolute: 0 10*3/uL (ref 0.0–0.1)
Eosinophils Absolute: 0 10*3/uL (ref 0.0–0.7)
Eosinophils Relative: 0 %
HCT: 38.5 % (ref 36.0–46.0)
HEMOGLOBIN: 13 g/dL (ref 12.0–15.0)
LYMPHS ABS: 1.5 10*3/uL (ref 0.7–4.0)
Lymphocytes Relative: 18 %
MCH: 31 pg (ref 26.0–34.0)
MCHC: 33.8 g/dL (ref 30.0–36.0)
MCV: 91.9 fL (ref 78.0–100.0)
MONOS PCT: 6 %
Monocytes Absolute: 0.5 10*3/uL (ref 0.1–1.0)
NEUTROS PCT: 76 %
Neutro Abs: 6.5 10*3/uL (ref 1.7–7.7)
Platelets: 152 10*3/uL (ref 150–400)
RBC: 4.19 MIL/uL (ref 3.87–5.11)
RDW: 13.6 % (ref 11.5–15.5)
WBC: 8.6 10*3/uL (ref 4.0–10.5)

## 2017-12-21 LAB — URINALYSIS, ROUTINE W REFLEX MICROSCOPIC
BILIRUBIN URINE: NEGATIVE
GLUCOSE, UA: NEGATIVE mg/dL
HGB URINE DIPSTICK: NEGATIVE
KETONES UR: NEGATIVE mg/dL
Leukocytes, UA: NEGATIVE
NITRITE: NEGATIVE
PH: 7 (ref 5.0–8.0)
Protein, ur: NEGATIVE mg/dL
SPECIFIC GRAVITY, URINE: 1.008 (ref 1.005–1.030)

## 2017-12-21 LAB — COMPREHENSIVE METABOLIC PANEL
ALBUMIN: 3.4 g/dL — AB (ref 3.5–5.0)
ALK PHOS: 39 U/L (ref 38–126)
ALT: 24 U/L (ref 14–54)
ANION GAP: 7 (ref 5–15)
AST: 28 U/L (ref 15–41)
BILIRUBIN TOTAL: 0.4 mg/dL (ref 0.3–1.2)
BUN: 16 mg/dL (ref 6–20)
CALCIUM: 8.9 mg/dL (ref 8.9–10.3)
CO2: 31 mmol/L (ref 22–32)
CREATININE: 0.65 mg/dL (ref 0.44–1.00)
Chloride: 98 mmol/L — ABNORMAL LOW (ref 101–111)
GFR calc Af Amer: >60 mL/min (ref >60)
GFR calc non Af Amer: >60 mL/min (ref >60)
GLUCOSE: 159 mg/dL — AB (ref 65–99)
Potassium: 4.2 mmol/L (ref 3.5–5.1)
SODIUM: 136 mmol/L (ref 135–145)
TOTAL PROTEIN: 6.6 g/dL (ref 6.5–8.1)

## 2017-12-21 LAB — CBC
HCT: 35.5 % — ABNORMAL LOW (ref 36.0–46.0)
Hemoglobin: 12 g/dL (ref 12.0–15.0)
MCH: 31.5 pg (ref 26.0–34.0)
MCHC: 33.8 g/dL (ref 30.0–36.0)
MCV: 93.2 fL (ref 78.0–100.0)
Platelets: 162 10*3/uL (ref 150–400)
RBC: 3.81 MIL/uL — ABNORMAL LOW (ref 3.87–5.11)
RDW: 13.8 % (ref 11.5–15.5)
WBC: 8.8 10*3/uL (ref 4.0–10.5)

## 2017-12-21 LAB — PROTIME-INR
INR: 1.22
Prothrombin Time: 15.3 seconds — ABNORMAL HIGH (ref 11.4–15.2)

## 2017-12-21 LAB — LIPASE, BLOOD: Lipase: 50 U/L (ref 11–51)

## 2017-12-21 LAB — MRSA PCR SCREENING: MRSA BY PCR: NEGATIVE

## 2017-12-21 LAB — APTT: aPTT: >200 seconds (ref 24–36)

## 2017-12-21 MED ORDER — ONDANSETRON HCL 4 MG/2ML IJ SOLN
4.0000 mg | Freq: Once | INTRAMUSCULAR | Status: AC
  Administered 2017-12-21: 4 mg via INTRAVENOUS
  Filled 2017-12-21: qty 2

## 2017-12-21 MED ORDER — SUCRALFATE 1 G PO TABS
1.0000 g | ORAL_TABLET | Freq: Three times a day (TID) | ORAL | Status: DC
  Administered 2017-12-21 – 2017-12-23 (×8): 1 g via ORAL
  Filled 2017-12-21 (×9): qty 1

## 2017-12-21 MED ORDER — ACETAMINOPHEN 325 MG PO TABS
650.0000 mg | ORAL_TABLET | Freq: Four times a day (QID) | ORAL | Status: DC | PRN
Start: 2017-12-21 — End: 2017-12-23
  Administered 2017-12-22: 650 mg via ORAL
  Filled 2017-12-21: qty 2

## 2017-12-21 MED ORDER — MEMANTINE HCL ER 28 MG PO CP24
28.0000 mg | ORAL_CAPSULE | Freq: Every day | ORAL | Status: DC
  Administered 2017-12-21 – 2017-12-23 (×3): 28 mg via ORAL
  Filled 2017-12-21 (×3): qty 1

## 2017-12-21 MED ORDER — VITAMIN D3 25 MCG (1000 UNIT) PO TABS
2000.0000 [IU] | ORAL_TABLET | Freq: Every day | ORAL | Status: DC
  Administered 2017-12-21 – 2017-12-23 (×3): 2000 [IU] via ORAL
  Filled 2017-12-21 (×3): qty 2

## 2017-12-21 MED ORDER — FLUTICASONE PROPIONATE 50 MCG/ACT NA SUSP
2.0000 | Freq: Every day | NASAL | Status: DC
  Administered 2017-12-21 – 2017-12-23 (×3): 2 via NASAL
  Filled 2017-12-21: qty 16

## 2017-12-21 MED ORDER — CROMOLYN SODIUM 4 % OP SOLN
1.0000 [drp] | Freq: Four times a day (QID) | OPHTHALMIC | Status: DC
  Administered 2017-12-21 – 2017-12-23 (×8): 1 [drp] via OPHTHALMIC
  Filled 2017-12-21: qty 10

## 2017-12-21 MED ORDER — CEPHALEXIN 500 MG PO CAPS
500.0000 mg | ORAL_CAPSULE | Freq: Two times a day (BID) | ORAL | Status: DC
  Administered 2017-12-21 – 2017-12-23 (×5): 500 mg via ORAL
  Filled 2017-12-21 (×5): qty 1

## 2017-12-21 MED ORDER — GABAPENTIN 300 MG PO CAPS
300.0000 mg | ORAL_CAPSULE | Freq: Every day | ORAL | Status: DC
  Administered 2017-12-21 – 2017-12-23 (×3): 300 mg via ORAL
  Filled 2017-12-21 (×3): qty 1

## 2017-12-21 MED ORDER — ACETAMINOPHEN 650 MG RE SUPP: 650.0000 mg | Freq: Four times a day (QID) | RECTAL | Status: DC | PRN

## 2017-12-21 MED ORDER — LATANOPROST 0.005 % OP SOLN
1.0000 [drp] | Freq: Every day | OPHTHALMIC | Status: DC
  Administered 2017-12-21 – 2017-12-22 (×2): 1 [drp] via OPHTHALMIC
  Filled 2017-12-21: qty 2.5

## 2017-12-21 MED ORDER — DONEPEZIL HCL 10 MG PO TABS
10.0000 mg | ORAL_TABLET | Freq: Every day | ORAL | Status: DC
  Administered 2017-12-21 – 2017-12-23 (×3): 10 mg via ORAL
  Filled 2017-12-21 (×3): qty 1

## 2017-12-21 MED ORDER — POLYETHYLENE GLYCOL 3350 17 G PO PACK
17.0000 g | PACK | Freq: Every day | ORAL | Status: DC
  Administered 2017-12-21 – 2017-12-23 (×3): 17 g via ORAL
  Filled 2017-12-21 (×3): qty 1

## 2017-12-21 MED ORDER — PANTOPRAZOLE SODIUM 40 MG PO TBEC
40.0000 mg | DELAYED_RELEASE_TABLET | Freq: Every day | ORAL | Status: DC
  Administered 2017-12-21 – 2017-12-23 (×3): 40 mg via ORAL
  Filled 2017-12-21 (×3): qty 1

## 2017-12-21 MED ORDER — MEMANTINE HCL-DONEPEZIL HCL ER 28-10 MG PO CP24: 1.0000 | ORAL_CAPSULE | Freq: Every day | ORAL | Status: DC

## 2017-12-21 MED ORDER — FENTANYL CITRATE (PF) 100 MCG/2ML IJ SOLN
25.0000 ug | INTRAMUSCULAR | Status: DC | PRN
  Administered 2017-12-21: 25 ug via INTRAVENOUS
  Filled 2017-12-21: qty 2

## 2017-12-21 MED ORDER — FOLIC ACID 1 MG PO TABS
1.0000 mg | ORAL_TABLET | Freq: Every day | ORAL | Status: DC
  Administered 2017-12-21 – 2017-12-23 (×3): 1 mg via ORAL
  Filled 2017-12-21 (×3): qty 1

## 2017-12-21 MED ORDER — IOPAMIDOL (ISOVUE-300) INJECTION 61%
INTRAVENOUS | Status: AC
Start: 2017-12-21 — End: 2017-12-21
  Filled 2017-12-21: qty 100

## 2017-12-21 MED ORDER — MIRABEGRON ER 25 MG PO TB24
50.0000 mg | ORAL_TABLET | Freq: Every day | ORAL | Status: DC
  Administered 2017-12-21 – 2017-12-23 (×3): 50 mg via ORAL
  Filled 2017-12-21 (×3): qty 2

## 2017-12-21 MED ORDER — IOPAMIDOL (ISOVUE-300) INJECTION 61%
100.0000 mL | Freq: Once | INTRAVENOUS | Status: AC | PRN
  Administered 2017-12-21: 100 mL via INTRAVENOUS

## 2017-12-21 MED ORDER — ROSUVASTATIN CALCIUM 5 MG PO TABS
5.0000 mg | ORAL_TABLET | Freq: Every day | ORAL | Status: DC
  Administered 2017-12-21 – 2017-12-23 (×3): 5 mg via ORAL
  Filled 2017-12-21 (×3): qty 1

## 2017-12-21 NOTE — ED Triage Notes (Signed)
Pt comes to ed, via ems c/o abdominal pain with extended stomach issues. Pt was just discharged from here yesterday. Pt comes from [facility]. V/s on arrival 138/86, hr92, rr20, pain 9 out 10. Pt has multiple allergies present.

## 2017-12-21 NOTE — H&P (Signed)
History and Physical    [name] YWV:[id number] DOB: 1926/02/20 DOA: 12/21/2017  PCP: Cassandria Anger, MD Patient coming from: SNF  Chief Complaint: Abdominal pain  HPI: [name] is a 84 y.o. female with medical history significant of CLL, peripheral neuropathy, essential hypertension, GERD, recent UTI, hyperlipidemia.  Patient was sent to the emergency department secondary to abdominal pain and mass noted on right lower quadrant patient reports that she has had.  A mass on her right lower quadrant for the past year and that it has started to be sharply painful in the last week.  She reports that in the past she was treated with laxatives and enemas secondary to an etiology of constipation.  She reports no injuries recently.  No reports of falls.   ED Course: Vitals: Afebrile, mild tachycardia to low 100s, slight tachypnea in low 20s, normotensive, on room air Labs: INR 1.22, hemoglobin of 13 Imaging: CT scan is significant for large hematoma of the right rectus abdominis musculature, irregular bladder wall thickening Medications/Course: Zofran given  Review of Systems: Review of Systems  Gastrointestinal: Positive for abdominal pain. Negative for constipation, diarrhea, nausea and vomiting.  All other systems reviewed and are negative.   Past Medical History:  Diagnosis Date  . Allergy    rhinitis  . Chronic ethmoidal sinusitis   . Diverticulosis   . Diverticulosis of colon   . GERD (gastroesophageal reflux disease)   . Glaucoma   . Hx of colonic polyps   . Hyperlipidemia   . Hypertension   . Hypogammaglobulinemia ([place])    Monthly IVIG  . Leukemia ([place])    Chronic lymphocytic leukemia  . Osteoarthritis   . Peripheral neuropathy   . Pernicious anemia   . UTI (lower urinary tract infection) 2010  . Vitamin B12 deficiency     Past Surgical History:  Procedure Laterality Date  . ABDOMINAL HYSTERECTOMY  1987  . bilateral cataracts  2007  . bladder tack   1987  . L4-L5 Laminetomy  1999  . TUBAL LIGATION    . tubaligation  1961  . ureter reconstruction  1992     reports that she has never smoked. She has never used smokeless tobacco. She reports that she does not drink alcohol or use drugs.  Allergies  Allergen Reactions  . Pneumococcal Vaccines Swelling and Rash    Pneumococcal Vaccine-23 valent  . Clarithromycin Other (See Comments)    REACTION: sore mouth  . Hctz [Hydrochlorothiazide]     Low Na  . Oxycodone-Aspirin Other (See Comments)    REACTION: horrible nightmares  . Propoxyphene N-Acetaminophen Other (See Comments)    REACTION: couldn't wake her up    Family History  Problem Relation Age of Onset  . Jaundice Father   . Stroke Son 65       in NH  . Cancer Mother        uterine  . Diabetes Mother   . Other Sister        brain tumor  . Cancer Sister   . Hypertension Other   . Cancer Other        breast  . Thyroid disease Sister   . Cancer Sister        thyroid cancer    Prior to Admission medications   Medication Sig Start Date End Date Taking? Authorizing Provider  acetaminophen (TYLENOL) 500 MG tablet Take 500 mg by mouth 2 (two) times daily.    Yes [provider]  aspirin 81 MG EC tablet Take 81 mg by mouth daily.     Yes [provider]  Cholecalciferol (VITAMIN D3) 2000 units capsule Take 1 capsule (2,000 Units total) by mouth daily. 11/23/16  Yes Plotnikov, Evie Lacks, MD  clotrimazole-betamethasone (LOTRISONE) cream Apply 1 application topically 2 (two) times daily. 11/20/17 11/20/18 Yes Plotnikov, Evie Lacks, MD  conjugated estrogens (PREMARIN) vaginal cream 1/2 GRAM VAGINALLY EVERY NIGHT X 1 WEEK, THEN TWICE WEEKLY AT BEDTIME 11/11/17  Yes Plotnikov, Evie Lacks, MD  cromolyn (OPTICROM) 4 % ophthalmic solution 1 DROP IN BOTH EYES FOUR TIMES A DAY 03/21/16  Yes [provider]  cyanocobalamin (,VITAMIN B-12,) 1000 MCG/ML injection ADMINISTER 1 CC SUBQ EVERY 4 WEEKS AS DIRECTED 08/07/16   Yes Plotnikov, Evie Lacks, MD  fluconazole (DIFLUCAN) 150 MG tablet TAKE 1 TABLET (150 MG TOTAL) BY MOUTH ONCE FOR 1 DOSE. REPEAT IN 1 WEEK IF NEEDED. 12/13/17  Yes [provider]  fluticasone (FLONASE) 50 MCG/ACT nasal spray Place 2 sprays into both nostrils daily. 11/21/16  Yes Plotnikov, Evie Lacks, MD  folic acid (FOLVITE) 1 MG tablet Take 1 mg by mouth daily. For restless legs   Yes [provider]  gabapentin (NEURONTIN) 300 MG capsule TAKE 1 CAPSULE (300 MG TOTAL) BY MOUTH DAILY. 11/28/17  Yes Plotnikov, Evie Lacks, MD  hydrochlorothiazide (MICROZIDE) 12.5 MG capsule Take 1 capsule (12.5 mg total) by mouth daily. For blood pressure 07/12/17  Yes Plotnikov, Evie Lacks, MD  KLOR-CON M10 10 MEQ tablet TAKE 1 TABLET (10 MEQ TOTAL) BY MOUTH DAILY. 11/05/17  Yes Plotnikov, Evie Lacks, MD  latanoprost (XALATAN) 0.005 % ophthalmic solution PLACE 1 DROP INTO BOTH EYES AT BEDTIME. 11/11/17  Yes Plotnikov, Evie Lacks, MD  loratadine (CLARITIN) 10 MG tablet TAKE 1 TABLET (10 MG TOTAL) BY MOUTH DAILY. 11/19/17  Yes Plotnikov, Evie Lacks, MD  magic mouthwash w/lidocaine SOLN Take 5 mLs by mouth 3 (three) times daily as needed for mouth pain. Swish, hold, spit 12/13/17  Yes Plotnikov, Evie Lacks, MD  Memantine HCl-Donepezil HCl 28-10 MG CP24 Take 1 capsule by mouth daily. 12/13/17  Yes Plotnikov, Evie Lacks, MD  mirabegron ER (MYRBETRIQ) 50 MG TB24 tablet Take 1 tablet (50 mg total) by mouth daily. 11/20/17  Yes Plotnikov, Evie Lacks, MD  Multiple Vitamins-Minerals (CENTRUM SILVER 50+WOMEN) TABS Take 1 tablet by mouth daily.    Yes [provider]  Nutritional Supplements (NUTRITIONAL SUPPLEMENT PO) Take 120 mLs by mouth 2 (two) times daily.   Yes [provider]  omeprazole (PRILOSEC) 20 MG capsule Take 20 mg by mouth daily.   Yes [provider]  polyethylene glycol (MIRALAX / GLYCOLAX) packet Take 17 g by mouth daily. 12/21/17  Yes Mikhail, Velta Addison, DO  rosuvastatin (CRESTOR) 5 MG  tablet Take 1 tablet (5 mg total) by mouth daily. 08/21/17  Yes Plotnikov, Evie Lacks, MD  sucralfate (CARAFATE) 1 g tablet Take 1 tablet (1 g total) by mouth 4 (four) times daily -  with meals and at bedtime. 08/07/17  Yes Plotnikov, Evie Lacks, MD  triamcinolone ointment (KENALOG) 0.1 % Apply 1 application topically 2 (two) times daily. 12/13/17  Yes Plotnikov, Evie Lacks, MD  B-D INS SYRINGE 0.5CC/30GX1/2" 30G X 1/2" 0.5 ML MISC USE TO ADMINISTER B12 INJECTIONS 06/13/17   Plotnikov, Evie Lacks, MD  cephALEXin (KEFLEX) 500 MG capsule Take 1 capsule (500 mg total) by mouth every 8 (eight) hours. 12/20/17   Mikhail, Velta Addison, DO  Dexlansoprazole (DEXILANT) 30 MG capsule Take  1 capsule (30 mg total) by mouth daily. Patient not taking: Reported on 12/21/2017 07/25/17 12/16/17  Plotnikov, Evie Lacks, MD  feeding supplement, ENSURE ENLIVE, (ENSURE ENLIVE) LIQD Take 237 mLs by mouth 3 (three) times daily between meals. Patient not taking: Reported on 12/21/2017 12/20/17   Cristal Ford, DO  Insulin Syringe-Needle U-100 (BD INSULIN SYRINGE U/F) 30G X 1/2" 0.5 ML MISC USE TO ADMINISTER B12 INJECTIONS 06/13/17   [provider]  RABEprazole (ACIPHEX) 20 MG tablet Take 1 tablet (20 mg total) by mouth 2 (two) times daily. Patient not taking: Reported on 12/21/2017 10/07/17   Cassandria Anger, MD    Physical Exam: Vitals:   12/21/17 0400 12/21/17 0430 12/21/17 0500 12/21/17 0600  BP: 131/71 (!) 150/98 119/82 113/74  Pulse: 73 (!) 105 (!) 105 (!) 102  Resp: (!) 21 (!) 23 (!) 27 (!) 21  Temp:      TempSrc:      SpO2: 95% 94% 91% 93%  Weight:      Height:         Constitutional: NAD, calm, comfortable Eyes: PERRL, lids and conjunctivae normal ENMT: Mucous membranes are moist. Posterior pharynx clear of any exudate or lesions.  Neck: normal, supple, no masses, no thyromegaly Respiratory: clear to auscultation bilaterally, no wheezing, no crackles. Normal respiratory effort. No accessory muscle use.    Cardiovascular: Regular rate and rhythm, no murmurs / rubs / gallops. No extremity edema. 2+ pedal pulses. No carotid bruits.  Abdomen: RLQ with firm mass that is exquisitely tender. Bowel sounds positive.  Musculoskeletal: no clubbing / cyanosis. No joint deformity upper and lower extremities. Good ROM, no contractures. Normal muscle tone.  Skin: no rashes, lesions, ulcers. No induration Neurologic: CN 2-12 grossly intact. Sensation intact, DTR normal. Strength 5/5 in all 4.  Psychiatric: judgment and insight appear intact. Alert and oriented x 3. Normal mood. Flat affect   Labs on Admission: I have personally reviewed following labs and imaging studies  CBC: Recent Labs  Lab 12/16/17 1418 12/17/17 0439 12/21/17 0335  WBC 5.3 7.6 8.6  NEUTROABS  --   --  6.5  HGB 14.7 14.5 13.0  HCT 41.7 42.2 38.5  MCV 90.5 90.2 91.9  PLT 189 218 166   Basic Metabolic Panel: Recent Labs  Lab 12/16/17 1418 12/16/17 1921 12/17/17 0439 12/21/17 0335  NA 132* 134* 134* 136  K 3.8 3.6 3.6 4.2  CL 93* 96* 98* 98*  CO2 28 27 25 31   GLUCOSE 108* 146* 89 159*  BUN 14 13 15 16   CREATININE 0.66 0.70 0.74 0.65  CALCIUM 9.2 9.0 8.8* 8.9   GFR: Estimated Creatinine Clearance: 41.2 mL/min (by C-G formula based on SCr of 0.65 mg/dL). Liver Function Tests: Recent Labs  Lab 12/21/17 0335  AST 28  ALT 24  ALKPHOS 39  BILITOT 0.4  PROT 6.6  ALBUMIN 3.4*   Recent Labs  Lab 12/21/17 0335  LIPASE 50   No results for input(s): AMMONIA in the last 168 hours. Coagulation Profile: Recent Labs  Lab 12/21/17 0346  INR 1.22   Cardiac Enzymes: No results for input(s): CKTOTAL, CKMB, CKMBINDEX, TROPONINI in the last 168 hours. BNP (last 3 results) No results for input(s): PROBNP in the last 8760 hours. HbA1C: No results for input(s): HGBA1C in the last 72 hours. CBG: Recent Labs  Lab 12/16/17 1357  GLUCAP 109*   Lipid Profile: No results for input(s): CHOL, HDL, LDLCALC, TRIG, CHOLHDL,  LDLDIRECT in the last 72 hours.  Thyroid Function Tests: No results for input(s): TSH, T4TOTAL, FREET4, T3FREE, THYROIDAB in the last 72 hours. Anemia Panel: No results for input(s): VITAMINB12, FOLATE, FERRITIN, TIBC, IRON, RETICCTPCT in the last 72 hours. Urine analysis:    Component Value Date/Time   COLORURINE YELLOW 12/21/2017 0335   APPEARANCEUR HAZY (A) 12/21/2017 0335   LABSPEC 1.008 12/21/2017 0335   LABSPEC 1.010 04/03/2017 0950   PHURINE 7.0 12/21/2017 0335   GLUCOSEU NEGATIVE 12/21/2017 0335   GLUCOSEU NEGATIVE 09/25/2017 1030   GLUCOSEU Negative 04/03/2017 0950   HGBUR NEGATIVE 12/21/2017 0335   HGBUR trace-intact 07/07/2008 1058   BILIRUBINUR NEGATIVE 12/21/2017 0335   BILIRUBINUR negative 11/16/2017 1653   BILIRUBINUR neg 11/02/2017 1524   BILIRUBINUR Negative 04/03/2017 0950   KETONESUR NEGATIVE 12/21/2017 0335   PROTEINUR NEGATIVE 12/21/2017 0335   UROBILINOGEN 0.2 11/16/2017 1653   UROBILINOGEN 0.2 09/25/2017 1030   UROBILINOGEN 0.2 04/03/2017 0950   NITRITE NEGATIVE 12/21/2017 0335   LEUKOCYTESUR NEGATIVE 12/21/2017 0335   LEUKOCYTESUR Trace 04/03/2017 0950   Sepsis Labs: !!!!!!!!!!!!!!!!!!!!!!!!!!!!!!!!!!!!!!!!!!!! @LABRCNTIP (procalcitonin:4,lacticidven:4) ) Recent Results (from the past 240 hour(s))  Urine culture     Status: Abnormal   Collection Time: 12/16/17  4:33 PM  Result Value Ref Range Status   Specimen Description   Final    URINE, CATHETERIZED Performed at [facility], [place] [address] [place], [place] [address]    Special Requests   Final    Normal Performed at [facility], [place] [address] [place], [place] [address]    Culture >=100,000 COLONIES/mL KLEBSIELLA OXYTOCA (A)  Final   Report Status 12/19/2017 FINAL  Final   Organism ID, Bacteria KLEBSIELLA OXYTOCA (A)  Final      Susceptibility   Klebsiella oxytoca - MIC*    AMPICILLIN >=32 RESISTANT Resistant     CEFAZOLIN 16 SENSITIVE Sensitive      CEFTRIAXONE <=1 SENSITIVE Sensitive     CIPROFLOXACIN <=0.25 SENSITIVE Sensitive     GENTAMICIN <=1 SENSITIVE Sensitive     IMIPENEM <=0.25 SENSITIVE Sensitive     NITROFURANTOIN 32 SENSITIVE Sensitive     TRIMETH/SULFA <=20 SENSITIVE Sensitive     AMPICILLIN/SULBACTAM 16 INTERMEDIATE Intermediate     PIP/TAZO <=4 SENSITIVE Sensitive     Extended ESBL NEGATIVE Sensitive     * >=100,000 COLONIES/mL KLEBSIELLA OXYTOCA     Radiological Exams on Admission: Ct Abdomen Pelvis W Contrast  Result Date: 12/21/2017 CLINICAL DATA:  Acute onset of generalized abdominal pain. Microhematuria with blood cells in the urine. EXAM: CT ABDOMEN AND PELVIS WITH CONTRAST TECHNIQUE: Multidetector CT imaging of the abdomen and pelvis was performed using the standard protocol following bolus administration of intravenous contrast. CONTRAST:  142mL ISOVUE-300 IOPAMIDOL (ISOVUE-300) INJECTION 61% COMPARISON:  CT of the abdomen and pelvis performed 12/16/2017 FINDINGS: Lower chest: Mild scarring is noted at the lung bases. Scattered coronary artery calcifications are seen. Hepatobiliary: A nonspecific 3.1 cm fluid collection is again noted at the posterior margin of the right hepatic lobe. The gallbladder is unremarkable in appearance. The common bile duct remains normal in caliber. Pancreas: The pancreas is within normal limits. Spleen: The spleen is unremarkable in appearance. Adrenals/Urinary Tract: The adrenal glands are unremarkable in appearance. Mild right renal scarring is noted. Nonspecific left-sided perinephric stranding is noted. There is no evidence of hydronephrosis. No renal or ureteral stones are identified. Stomach/Bowel: The stomach is unremarkable in appearance. The small bowel is within normal limits. The appendix is not visualized; there is no evidence  for appendicitis. The colon is unremarkable in appearance. Vascular/Lymphatic: Scattered calcification is seen along the abdominal aorta and its branches.  The abdominal aorta is otherwise grossly unremarkable. The inferior vena cava is grossly unremarkable. No retroperitoneal lymphadenopathy is seen. No pelvic sidewall lymphadenopathy is identified. Reproductive: The bladder is mildly distended, with diffuse irregular bladder wall thickening, new from the prior study, concerning for cystitis. The patient is status post hysterectomy. No suspicious adnexal masses are seen. Other: A large complex evolving hematoma is noted within the right rectus abdominis musculature, measuring approximately 24 cm in length. Blood tracks adjacent to the right side of the bladder, along the right pelvic sidewall. Musculoskeletal: No acute osseous abnormalities are identified. Multilevel vacuum phenomenon is noted along the lumbar spine. The visualized musculature is otherwise unremarkable in appearance. IMPRESSION: 1. Large complex evolving hematoma within the right rectus abdominis musculature, measuring approximately 24 cm in length. Blood tracks adjacent to the right side of the bladder, along the right pelvic sidewall. This is new from the recent prior CT. 2. New diffuse irregular bladder wall thickening is concerning for cystitis. 3. Nonspecific 3.1 cm fluid collection again noted at the posterior margin of the right hepatic lobe. 4. Scattered coronary artery calcifications seen. 5. Mild right renal scarring noted. Aortic Atherosclerosis (ICD10-I70.0). These results were called by telephone at the time of interpretation on 12/21/2017 at 6:51 am to Dr. Shanon Rosser, who verbally acknowledged these results. Electronically Signed   By: Garald Balding M.D.   On: 12/21/2017 06:54    Assessment/Plan Principal Problem:   Rectus sheath hematoma Active Problems:   Chronic lymphocytic leukemia (HCC)   Peripheral neuropathy (HCC)   Essential hypertension   GERD (gastroesophageal reflux disease)   Rectus sheath hematoma Unknown etiology.  No trauma.  Patient is not on blood  thinners.  INR is within normal range.  Hemoglobin is currently stable. -CBC this evening and tomorrow morning -Tylenol prn (patient has poor reactions to narcotics) -Warm compress  UTI Secondary to Klebsiella oxytocin infection.  Patient is currently being treated with Keflex as an outpatient. -Decrease to Keflex 500 mg twice daily secondary to renal function. Plan for two more days of therapy.  Dementia -Continue memantine/Donepezil  Hyperlipidemia -Continue Crestor  Glaucoma -Continue Cromolyn and Xalatan  GERD -Continue PPI  Peripheral neuropathy -Continue gabapentin  Constipation -Continue Miralax   DVT prophylaxis: SCDs Code Status: DNR Family Communication: None at bedside. No answer on telephone Disposition Plan: Discharge back to SNF if pain controlled adequately and no worsening overnight Consults called: None Admission status: Observation, medical floor   Cordelia Poche, MD [facility] Pager [phone]  If 7PM-7AM, please contact night-coverage [email or web address] Password [email or web address]  12/21/2017, 10:09 AM

## 2017-12-21 NOTE — ED Notes (Signed)
Report given to Megan RN

## 2017-12-21 NOTE — ED Provider Notes (Signed)
[facility] DEPT Provider Note: Georgena Spurling, MD, FACEP  CSN: [id number] MRN: [id number] ARRIVAL: 12/21/17 at [place]: [place]  Abdominal Pain   HISTORY OF PRESENT ILLNESS  12/21/17 5:27 AM [name] is a 84 y.o. female who was recently admitted to the hospital for urinary tract infection on May 26 was discharged home yesterday.  She returns with worse abdominal pain primarily in the right lower quadrant which is associated with a firm mass.  She rated her pain as a 9 out of 10 on arrival with pain worse with palpation or movement.  She was given fentanyl per protocol with improvement in her pain to a 7 out of 10.  She was also complaining of nausea without vomiting which improved with Zofran IV.  It is unclear if she had bowel movements yesterday as reports are conflicting.  The patient has been somewhat confused, per her son, for the past 2 weeks.  X-rays done at her nursing home suggested an ileus and she was sent for further evaluation.   Past Medical History:  Diagnosis Date  . Allergy    rhinitis  . Chronic ethmoidal sinusitis   . Diverticulosis   . Diverticulosis of colon   . GERD (gastroesophageal reflux disease)   . Glaucoma   . Hx of colonic polyps   . Hyperlipidemia   . Hypertension   . Hypogammaglobulinemia ([place])    Monthly IVIG  . Leukemia ([place])    Chronic lymphocytic leukemia  . Osteoarthritis   . Peripheral neuropathy   . Pernicious anemia   . UTI (lower urinary tract infection) 2010  . Vitamin B12 deficiency     Past Surgical History:  Procedure Laterality Date  . ABDOMINAL HYSTERECTOMY  1987  . bilateral cataracts  2007  . bladder tack  1987  . L4-L5 Laminetomy  1999  . TUBAL LIGATION    . tubaligation  1961  . ureter reconstruction  1992    Family History  Problem Relation Age of Onset  . Jaundice Father   . Stroke Son 69       in NH  . Cancer Mother        uterine  . Diabetes Mother   . Other Sister    brain tumor  . Cancer Sister   . Hypertension Other   . Cancer Other        breast  . Thyroid disease Sister   . Cancer Sister        thyroid cancer    Social History   Tobacco Use  . Smoking status: Never Smoker  . Smokeless tobacco: Never Used  Substance Use Topics  . Alcohol use: No    Alcohol/week: 0.0 oz  . Drug use: No    Prior to Admission medications   Medication Sig Start Date End Date Taking? Authorizing Provider  acetaminophen (TYLENOL) 500 MG tablet Take 500 mg by mouth 2 (two) times daily.    Yes [provider]  aspirin 81 MG EC tablet Take 81 mg by mouth daily.     Yes [provider]  Cholecalciferol (VITAMIN D3) 2000 units capsule Take 1 capsule (2,000 Units total) by mouth daily. 11/23/16  Yes Plotnikov, Evie Lacks, MD  clotrimazole-betamethasone (LOTRISONE) cream Apply 1 application topically 2 (two) times daily. 11/20/17 11/20/18 Yes Plotnikov, Evie Lacks, MD  conjugated estrogens (PREMARIN) vaginal cream 1/2 GRAM VAGINALLY EVERY NIGHT X 1 WEEK, THEN TWICE WEEKLY AT BEDTIME 11/11/17  Yes Plotnikov, Tyrone Apple  V, MD  cromolyn (OPTICROM) 4 % ophthalmic solution 1 DROP IN BOTH EYES FOUR TIMES A DAY 03/21/16  Yes [provider]  cyanocobalamin (,VITAMIN B-12,) 1000 MCG/ML injection ADMINISTER 1 CC SUBQ EVERY 4 WEEKS AS DIRECTED 08/07/16  Yes Plotnikov, Evie Lacks, MD  fluconazole (DIFLUCAN) 150 MG tablet TAKE 1 TABLET (150 MG TOTAL) BY MOUTH ONCE FOR 1 DOSE. REPEAT IN 1 WEEK IF NEEDED. 12/13/17  Yes [provider]  fluticasone (FLONASE) 50 MCG/ACT nasal spray Place 2 sprays into both nostrils daily. 11/21/16  Yes Plotnikov, Evie Lacks, MD  folic acid (FOLVITE) 1 MG tablet Take 1 mg by mouth daily. For restless legs   Yes [provider]  gabapentin (NEURONTIN) 300 MG capsule TAKE 1 CAPSULE (300 MG TOTAL) BY MOUTH DAILY. 11/28/17  Yes Plotnikov, Evie Lacks, MD  hydrochlorothiazide (MICROZIDE) 12.5 MG capsule Take 1 capsule (12.5 mg total) by  mouth daily. For blood pressure 07/12/17  Yes Plotnikov, Evie Lacks, MD  KLOR-CON M10 10 MEQ tablet TAKE 1 TABLET (10 MEQ TOTAL) BY MOUTH DAILY. 11/05/17  Yes Plotnikov, Evie Lacks, MD  latanoprost (XALATAN) 0.005 % ophthalmic solution PLACE 1 DROP INTO BOTH EYES AT BEDTIME. 11/11/17  Yes Plotnikov, Evie Lacks, MD  loratadine (CLARITIN) 10 MG tablet TAKE 1 TABLET (10 MG TOTAL) BY MOUTH DAILY. 11/19/17  Yes Plotnikov, Evie Lacks, MD  magic mouthwash w/lidocaine SOLN Take 5 mLs by mouth 3 (three) times daily as needed for mouth pain. Swish, hold, spit 12/13/17  Yes Plotnikov, Evie Lacks, MD  Memantine HCl-Donepezil HCl 28-10 MG CP24 Take 1 capsule by mouth daily. 12/13/17  Yes Plotnikov, Evie Lacks, MD  mirabegron ER (MYRBETRIQ) 50 MG TB24 tablet Take 1 tablet (50 mg total) by mouth daily. 11/20/17  Yes Plotnikov, Evie Lacks, MD  Multiple Vitamins-Minerals (CENTRUM SILVER 50+WOMEN) TABS Take 1 tablet by mouth daily.    Yes [provider]  Nutritional Supplements (NUTRITIONAL SUPPLEMENT PO) Take 120 mLs by mouth 2 (two) times daily.   Yes [provider]  omeprazole (PRILOSEC) 20 MG capsule Take 20 mg by mouth daily.   Yes [provider]  polyethylene glycol (MIRALAX / GLYCOLAX) packet Take 17 g by mouth daily. 12/21/17  Yes Mikhail, Velta Addison, DO  rosuvastatin (CRESTOR) 5 MG tablet Take 1 tablet (5 mg total) by mouth daily. 08/21/17  Yes Plotnikov, Evie Lacks, MD  sucralfate (CARAFATE) 1 g tablet Take 1 tablet (1 g total) by mouth 4 (four) times daily -  with meals and at bedtime. 08/07/17  Yes Plotnikov, Evie Lacks, MD  triamcinolone ointment (KENALOG) 0.1 % Apply 1 application topically 2 (two) times daily. 12/13/17  Yes Plotnikov, Evie Lacks, MD  B-D INS SYRINGE 0.5CC/30GX1/2" 30G X 1/2" 0.5 ML MISC USE TO ADMINISTER B12 INJECTIONS 06/13/17   Plotnikov, Evie Lacks, MD  cephALEXin (KEFLEX) 500 MG capsule Take 1 capsule (500 mg total) by mouth every 8 (eight) hours. 12/20/17   Mikhail, Velta Addison, DO   Dexlansoprazole (DEXILANT) 30 MG capsule Take 1 capsule (30 mg total) by mouth daily. Patient not taking: Reported on 12/21/2017 07/25/17 12/16/17  Plotnikov, Evie Lacks, MD  feeding supplement, ENSURE ENLIVE, (ENSURE ENLIVE) LIQD Take 237 mLs by mouth 3 (three) times daily between meals. Patient not taking: Reported on 12/21/2017 12/20/17   Cristal Ford, DO  Insulin Syringe-Needle U-100 (BD INSULIN SYRINGE U/F) 30G X 1/2" 0.5 ML MISC USE TO ADMINISTER B12 INJECTIONS 06/13/17   [provider]  RABEprazole (ACIPHEX) 20 MG tablet Take 1  tablet (20 mg total) by mouth 2 (two) times daily. Patient not taking: Reported on 12/21/2017 10/07/17   Plotnikov, Evie Lacks, MD    Allergies Pneumococcal vaccines; Clarithromycin; Hctz [hydrochlorothiazide]; Oxycodone-aspirin; and Propoxyphene n-acetaminophen   REVIEW OF SYSTEMS  Negative except as noted here or in the History of Present Illness.   PHYSICAL EXAMINATION  Initial Vital Signs Blood pressure (!) 150/98, pulse (!) 105, temperature 98 F (36.7 C), temperature source Oral, resp. rate (!) 23, height 5\' 4"  (1.626 m), weight 61.7 kg (136 lb), SpO2 94 %.  Examination General: Well-developed, well-nourished female in no acute distress; appearance consistent with age of record HENT: normocephalic; atraumatic Eyes: pupils equal, round and reactive to light; extraocular muscles intact Neck: supple Heart: regular rate and rhythm Lungs: clear to auscultation bilaterally Abdomen: soft; nondistended; large, tender right lower quadrant mass; bowel sounds hypoactive Extremities: No deformity; full range of motion; pulses normal Neurologic: Awake, alert; motor function intact in all extremities and symmetric; no facial droop Skin: Warm and dry Psychiatric: Flat affect   RESULTS  Summary of this visit's results, reviewed by myself:   EKG Interpretation  Date/Time:    Ventricular Rate:    PR Interval:    QRS Duration:   QT Interval:      QTC Calculation:   R Axis:     Text Interpretation:        Laboratory Studies: Results for orders placed or performed during the hospital encounter of 12/21/17 (from the past 24 hour(s))  CBC with Differential/Platelet     Status: None   Collection Time: 12/21/17  3:35 AM  Result Value Ref Range   WBC 8.6 4.0 - 10.5 K/uL   RBC 4.19 3.87 - 5.11 MIL/uL   Hemoglobin 13.0 12.0 - 15.0 g/dL   HCT 38.5 36.0 - 46.0 %   MCV 91.9 78.0 - 100.0 fL   MCH 31.0 26.0 - 34.0 pg   MCHC 33.8 30.0 - 36.0 g/dL   RDW 13.6 11.5 - 15.5 %   Platelets 152 150 - 400 K/uL   Neutrophils Relative % 76 %   Neutro Abs 6.5 1.7 - 7.7 K/uL   Lymphocytes Relative 18 %   Lymphs Abs 1.5 0.7 - 4.0 K/uL   Monocytes Relative 6 %   Monocytes Absolute 0.5 0.1 - 1.0 K/uL   Eosinophils Relative 0 %   Eosinophils Absolute 0.0 0.0 - 0.7 K/uL   Basophils Relative 0 %   Basophils Absolute 0.0 0.0 - 0.1 K/uL  Comprehensive metabolic panel     Status: Abnormal   Collection Time: 12/21/17  3:35 AM  Result Value Ref Range   Sodium 136 135 - 145 mmol/L   Potassium 4.2 3.5 - 5.1 mmol/L   Chloride 98 (L) 101 - 111 mmol/L   CO2 31 22 - 32 mmol/L   Glucose, Bld 159 (H) 65 - 99 mg/dL   BUN 16 6 - 20 mg/dL   Creatinine, Ser 0.65 0.44 - 1.00 mg/dL   Calcium 8.9 8.9 - 10.3 mg/dL   Total Protein 6.6 6.5 - 8.1 g/dL   Albumin 3.4 (L) 3.5 - 5.0 g/dL   AST 28 15 - 41 U/L   ALT 24 14 - 54 U/L   Alkaline Phosphatase 39 38 - 126 U/L   Total Bilirubin 0.4 0.3 - 1.2 mg/dL   GFR calc non Af Amer >60 >60 mL/min   GFR calc Af Amer >60 >60 mL/min   Anion gap 7 5 - 15  Lipase, blood     Status: None   Collection Time: 12/21/17  3:35 AM  Result Value Ref Range   Lipase 50 11 - 51 U/L  Urinalysis, Routine w reflex microscopic     Status: Abnormal   Collection Time: 12/21/17  3:35 AM  Result Value Ref Range   Color, Urine YELLOW YELLOW   APPearance HAZY (A) CLEAR   Specific Gravity, Urine 1.008 1.005 - 1.030   pH 7.0 5.0 - 8.0    Glucose, UA NEGATIVE NEGATIVE mg/dL   Hgb urine dipstick NEGATIVE NEGATIVE   Bilirubin Urine NEGATIVE NEGATIVE   Ketones, ur NEGATIVE NEGATIVE mg/dL   Protein, ur NEGATIVE NEGATIVE mg/dL   Nitrite NEGATIVE NEGATIVE   Leukocytes, UA NEGATIVE NEGATIVE   Imaging Studies: Ct Abdomen Pelvis W Contrast  Result Date: 12/21/2017 CLINICAL DATA:  Acute onset of generalized abdominal pain. Microhematuria with blood cells in the urine. EXAM: CT ABDOMEN AND PELVIS WITH CONTRAST TECHNIQUE: Multidetector CT imaging of the abdomen and pelvis was performed using the standard protocol following bolus administration of intravenous contrast. CONTRAST:  144mL ISOVUE-300 IOPAMIDOL (ISOVUE-300) INJECTION 61% COMPARISON:  CT of the abdomen and pelvis performed 12/16/2017 FINDINGS: Lower chest: Mild scarring is noted at the lung bases. Scattered coronary artery calcifications are seen. Hepatobiliary: A nonspecific 3.1 cm fluid collection is again noted at the posterior margin of the right hepatic lobe. The gallbladder is unremarkable in appearance. The common bile duct remains normal in caliber. Pancreas: The pancreas is within normal limits. Spleen: The spleen is unremarkable in appearance. Adrenals/Urinary Tract: The adrenal glands are unremarkable in appearance. Mild right renal scarring is noted. Nonspecific left-sided perinephric stranding is noted. There is no evidence of hydronephrosis. No renal or ureteral stones are identified. Stomach/Bowel: The stomach is unremarkable in appearance. The small bowel is within normal limits. The appendix is not visualized; there is no evidence for appendicitis. The colon is unremarkable in appearance. Vascular/Lymphatic: Scattered calcification is seen along the abdominal aorta and its branches. The abdominal aorta is otherwise grossly unremarkable. The inferior vena cava is grossly unremarkable. No retroperitoneal lymphadenopathy is seen. No pelvic sidewall lymphadenopathy is  identified. Reproductive: The bladder is mildly distended, with diffuse irregular bladder wall thickening, new from the prior study, concerning for cystitis. The patient is status post hysterectomy. No suspicious adnexal masses are seen. Other: A large complex evolving hematoma is noted within the right rectus abdominis musculature, measuring approximately 24 cm in length. Blood tracks adjacent to the right side of the bladder, along the right pelvic sidewall. Musculoskeletal: No acute osseous abnormalities are identified. Multilevel vacuum phenomenon is noted along the lumbar spine. The visualized musculature is otherwise unremarkable in appearance. IMPRESSION: 1. Large complex evolving hematoma within the right rectus abdominis musculature, measuring approximately 24 cm in length. Blood tracks adjacent to the right side of the bladder, along the right pelvic sidewall. This is new from the recent prior CT. 2. New diffuse irregular bladder wall thickening is concerning for cystitis. 3. Nonspecific 3.1 cm fluid collection again noted at the posterior margin of the right hepatic lobe. 4. Scattered coronary artery calcifications seen. 5. Mild right renal scarring noted. Aortic Atherosclerosis (ICD10-I70.0). These results were called by telephone at the time of interpretation on 12/21/2017 at 6:51 am to Dr. Shanon Rosser, who verbally acknowledged these results. Electronically Signed   By: Garald Balding M.D.   On: 12/21/2017 06:54    ED COURSE and MDM  Nursing notes and initial vitals signs, including pulse  oximetry, reviewed.  Vitals:   12/21/17 0400 12/21/17 0430 12/21/17 0500 12/21/17 0600  BP: 131/71 (!) 150/98 119/82 113/74  Pulse: 73 (!) 105 (!) 105 (!) 102  Resp: (!) 21 (!) 23 (!) 27 (!) 21  Temp:      TempSrc:      SpO2: 95% 94% 91% 93%  Weight:      Height:       7:09 AM CT scan shows an acute rectus sheath hemorrhage.  The patient is not on anticoagulants nor does she recall any recent trauma.   She is having significant pain at the site.  She also complains of feeling extremely fatigued.  [facility] to admit.  PROCEDURES    ED DIAGNOSES     ICD-10-CM   1. Rectus sheath hematoma, initial encounter S30.1XXA        Cason Luffman, Jenny Reichmann, MD 12/21/17 [phone]

## 2017-12-21 NOTE — ED Notes (Signed)
Bed: EZ74 Expected date:  Expected time:  Means of arrival:  Comments: 84 yr old female UTI/abd pain

## 2017-12-21 NOTE — ED Notes (Signed)
[name] son cell number [phone].

## 2017-12-21 NOTE — Telephone Encounter (Signed)
Pt was admitted for observation 12/16/17 for UTI sxs and acute metabolic encephalopathy. Patient was discharged 12/20/17  to skilled nursing facility. Continue physical and occupational therapy.  Patient will need to follow up with primary care provider within one week of discharge...[name]

## 2017-12-22 DIAGNOSIS — I10 Essential (primary) hypertension: Secondary | ICD-10-CM | POA: Diagnosis not present

## 2017-12-22 DIAGNOSIS — S301XXA Contusion of abdominal wall, initial encounter: Secondary | ICD-10-CM

## 2017-12-22 DIAGNOSIS — G609 Hereditary and idiopathic neuropathy, unspecified: Secondary | ICD-10-CM | POA: Diagnosis not present

## 2017-12-22 LAB — URINE CULTURE: Culture: NO GROWTH

## 2017-12-22 LAB — CBC
HEMATOCRIT: 31.8 % — AB (ref 36.0–46.0)
HEMOGLOBIN: 10.6 g/dL — AB (ref 12.0–15.0)
MCH: 31.1 pg (ref 26.0–34.0)
MCHC: 33.3 g/dL (ref 30.0–36.0)
MCV: 93.3 fL (ref 78.0–100.0)
Platelets: 142 10*3/uL — ABNORMAL LOW (ref 150–400)
RBC: 3.41 MIL/uL — ABNORMAL LOW (ref 3.87–5.11)
RDW: 13.9 % (ref 11.5–15.5)
WBC: 7.2 10*3/uL (ref 4.0–10.5)

## 2017-12-22 MED ORDER — PREMIER PROTEIN SHAKE
2.0000 [oz_av] | Freq: Four times a day (QID) | ORAL | Status: DC
  Administered 2017-12-22 – 2017-12-23 (×3): 2 [oz_av] via ORAL
  Filled 2017-12-22 (×5): qty 325.31

## 2017-12-22 NOTE — Progress Notes (Signed)
PROGRESS NOTE [facility]   [name]   MVH:[id number] DOB: June 07, 1926  DOA: 12/21/2017 PCP: Cassandria Anger, MD   Brief Narrative:  [name] 84 year old female with medical history significant for CLL, hypertension, peripheral neuropathy and GERD who presented to the emergency department complaining of abdominal pain.  Patient was recently admitted for UTI, treated with antibiotic and discharged on 5/30 to SNF due to significant weakness.  From the skilled nursing facility she complaining of abdominal pain and was sent back to the emergency department.  Upon ED evaluation CT abdomen and pelvis reveal large complex evolving hematoma within the right rectus abdominal muscle measuring approximately 24 cm.  Patient was admitted with working diagnosis of abdominal wall hematoma and further hemoglobin monitoring.  Subjective: Patient seen and examined, she is complaining abdominal pain and weakness.  No other concerns at this time.  She states "I think that it is my time to go "  Assessment & Plan: Right rectus abdominal muscle hematoma Unclear etiology at this time, patient not on anticoagulation, however she was receiving Lovenox during hospital stay.  INR within normal limits.  Hemoglobin has dropped 4 g from 5/30.  Apply warm compresses.  Monitor hemoglobin, if continues to drop repeat CAT scan in a.m. pain control as needed  UTI Klebsiella oxytocin from prior urine culture, patient on Keflex.  Continue antibiotics for 2 more days.  General deconditioning Patient with severe weakness, which is why she was discharged to SNF. Continue PT, out of bed as tolerated.  Will add nutritional supplement  Dementia Continue memantine/donepezil Patient alert and oriented x3  Peripheral neuropathy Continue gabapentin, monitor closely as patient is sleepy.  DVT prophylaxis: SCDs Code Status: DNR Family Communication: None at bedside Disposition Plan: Back to SNF in a.m. if  hemoglobin stable  Consultants:   None  Procedures:   None  Antimicrobials:  Keflex   Objective: Vitals:   12/21/17 1342 12/21/17 2007 12/22/17 0443 12/22/17 1331  BP: 134/75 122/75 130/72 125/77  Pulse: 96 92 87 (!) 103  Resp: 16 (!) 24 18 18   Temp: 98.6 F (37 C) 97.9 F (36.6 C) 98.2 F (36.8 C) 97.7 F (36.5 C)  TempSrc: Oral Oral Oral Oral  SpO2: 96% 93% 92% 94%  Weight:      Height:        Intake/Output Summary (Last 24 hours) at 12/22/2017 1605 Last data filed at 12/22/2017 1100 Gross per 24 hour  Intake 120 ml  Output 1550 ml  Net -1430 ml   Filed Weights   12/21/17 0233  Weight: 61.7 kg (136 lb)    Examination:  General exam: NAD HEENT: Conjunctiva pale, OP moist and clear Respiratory system: Clear to auscultation. No wheezes,crackle or rhonchi Cardiovascular system: S1 & S2 heard, RRR. No JVD, murmurs, rubs or gallops Gastrointestinal system: Abdomen soft, nondistended, tender along the right rectus abdominal muscle.  Positive bowel movement Central nervous system: Alert and oriented x 3. No focal neurological deficits. Extremities: No pedal edema.  Skin: No rashes Psychiatry: Judgement and insight appear normal. Mood & affect flat.   Data Reviewed: I have personally reviewed following labs and imaging studies  CBC: Recent Labs  Lab 12/16/17 1418 12/17/17 0439 12/21/17 0335 12/21/17 1559 12/22/17 0600  WBC 5.3 7.6 8.6 8.8 7.2  NEUTROABS  --   --  6.5  --   --   HGB 14.7 14.5 13.0 12.0 10.6*  HCT 41.7 42.2 38.5 35.5* 31.8*  MCV 90.5 90.2 91.9  93.2 93.3  PLT 189 218 152 162 656*   Basic Metabolic Panel: Recent Labs  Lab 12/16/17 1418 12/16/17 1921 12/17/17 0439 12/21/17 0335  NA 132* 134* 134* 136  K 3.8 3.6 3.6 4.2  CL 93* 96* 98* 98*  CO2 28 27 25 31   GLUCOSE 108* 146* 89 159*  BUN 14 13 15 16   CREATININE 0.66 0.70 0.74 0.65  CALCIUM 9.2 9.0 8.8* 8.9   GFR: Estimated Creatinine Clearance: 41.2 mL/min (by C-G formula  based on SCr of 0.65 mg/dL). Liver Function Tests: Recent Labs  Lab 12/21/17 0335  AST 28  ALT 24  ALKPHOS 39  BILITOT 0.4  PROT 6.6  ALBUMIN 3.4*   Recent Labs  Lab 12/21/17 0335  LIPASE 50   No results for input(s): AMMONIA in the last 168 hours. Coagulation Profile: Recent Labs  Lab 12/21/17 0346  INR 1.22   Cardiac Enzymes: No results for input(s): CKTOTAL, CKMB, CKMBINDEX, TROPONINI in the last 168 hours. BNP (last 3 results) No results for input(s): PROBNP in the last 8760 hours. HbA1C: No results for input(s): HGBA1C in the last 72 hours. CBG: Recent Labs  Lab 12/16/17 1357  GLUCAP 109*   Lipid Profile: No results for input(s): CHOL, HDL, LDLCALC, TRIG, CHOLHDL, LDLDIRECT in the last 72 hours. Thyroid Function Tests: No results for input(s): TSH, T4TOTAL, FREET4, T3FREE, THYROIDAB in the last 72 hours. Anemia Panel: No results for input(s): VITAMINB12, FOLATE, FERRITIN, TIBC, IRON, RETICCTPCT in the last 72 hours. Sepsis Labs: Recent Labs  Lab 12/16/17 1425  LATICACIDVEN 0.89    Recent Results (from the past 240 hour(s))  Urine culture     Status: Abnormal   Collection Time: 12/16/17  4:33 PM  Result Value Ref Range Status   Specimen Description   Final    URINE, CATHETERIZED Performed at [facility] [address] [place], [place] [address]    Special Requests   Final    Normal Performed at [facility], [place] [address], [place], [place] [address]    Culture >=100,000 COLONIES/mL KLEBSIELLA OXYTOCA (A)  Final   Report Status 12/19/2017 FINAL  Final   Organism ID, Bacteria KLEBSIELLA OXYTOCA (A)  Final      Susceptibility   Klebsiella oxytoca - MIC*    AMPICILLIN >=32 RESISTANT Resistant     CEFAZOLIN 16 SENSITIVE Sensitive     CEFTRIAXONE <=1 SENSITIVE Sensitive     CIPROFLOXACIN <=0.25 SENSITIVE Sensitive     GENTAMICIN <=1 SENSITIVE Sensitive     IMIPENEM <=0.25 SENSITIVE Sensitive      NITROFURANTOIN 32 SENSITIVE Sensitive     TRIMETH/SULFA <=20 SENSITIVE Sensitive     AMPICILLIN/SULBACTAM 16 INTERMEDIATE Intermediate     PIP/TAZO <=4 SENSITIVE Sensitive     Extended ESBL NEGATIVE Sensitive     * >=100,000 COLONIES/mL KLEBSIELLA OXYTOCA  Urine culture     Status: None   Collection Time: 12/21/17  3:35 AM  Result Value Ref Range Status   Specimen Description   Final    URINE, CLEAN CATCH Performed at [facility] [address] [place], [place] [address]    Special Requests   Final    NONE Performed at [facility], [place] [address], [place], [place] [address]    Culture   Final    NO GROWTH Performed at [facility], [place] [address], [place],  [address]    Report Status 12/22/2017 FINAL  Final  MRSA  PCR Screening     Status: None   Collection Time: 12/21/17 12:51 PM  Result Value Ref Range Status   MRSA by PCR NEGATIVE NEGATIVE Final    Comment:        The GeneXpert MRSA Assay (FDA approved for NASAL specimens only), is one component of a comprehensive MRSA colonization surveillance program. It is not intended to diagnose MRSA infection nor to guide or monitor treatment for MRSA infections. Performed at [facility], [place] [address], [place], [place] [address]       Radiology Studies: Ct Abdomen Pelvis W Contrast  Result Date: 12/21/2017 CLINICAL DATA:  Acute onset of generalized abdominal pain. Microhematuria with blood cells in the urine. EXAM: CT ABDOMEN AND PELVIS WITH CONTRAST TECHNIQUE: Multidetector CT imaging of the abdomen and pelvis was performed using the standard protocol following bolus administration of intravenous contrast. CONTRAST:  147mL ISOVUE-300 IOPAMIDOL (ISOVUE-300) INJECTION 61% COMPARISON:  CT of the abdomen and pelvis performed 12/16/2017 FINDINGS: Lower chest: Mild scarring is noted at the lung bases. Scattered coronary artery calcifications are seen.  Hepatobiliary: A nonspecific 3.1 cm fluid collection is again noted at the posterior margin of the right hepatic lobe. The gallbladder is unremarkable in appearance. The common bile duct remains normal in caliber. Pancreas: The pancreas is within normal limits. Spleen: The spleen is unremarkable in appearance. Adrenals/Urinary Tract: The adrenal glands are unremarkable in appearance. Mild right renal scarring is noted. Nonspecific left-sided perinephric stranding is noted. There is no evidence of hydronephrosis. No renal or ureteral stones are identified. Stomach/Bowel: The stomach is unremarkable in appearance. The small bowel is within normal limits. The appendix is not visualized; there is no evidence for appendicitis. The colon is unremarkable in appearance. Vascular/Lymphatic: Scattered calcification is seen along the abdominal aorta and its branches. The abdominal aorta is otherwise grossly unremarkable. The inferior vena cava is grossly unremarkable. No retroperitoneal lymphadenopathy is seen. No pelvic sidewall lymphadenopathy is identified. Reproductive: The bladder is mildly distended, with diffuse irregular bladder wall thickening, new from the prior study, concerning for cystitis. The patient is status post hysterectomy. No suspicious adnexal masses are seen. Other: A large complex evolving hematoma is noted within the right rectus abdominis musculature, measuring approximately 24 cm in length. Blood tracks adjacent to the right side of the bladder, along the right pelvic sidewall. Musculoskeletal: No acute osseous abnormalities are identified. Multilevel vacuum phenomenon is noted along the lumbar spine. The visualized musculature is otherwise unremarkable in appearance. IMPRESSION: 1. Large complex evolving hematoma within the right rectus abdominis musculature, measuring approximately 24 cm in length. Blood tracks adjacent to the right side of the bladder, along the right pelvic sidewall. This is new  from the recent prior CT. 2. New diffuse irregular bladder wall thickening is concerning for cystitis. 3. Nonspecific 3.1 cm fluid collection again noted at the posterior margin of the right hepatic lobe. 4. Scattered coronary artery calcifications seen. 5. Mild right renal scarring noted. Aortic Atherosclerosis (ICD10-I70.0). These results were called by telephone at the time of interpretation on 12/21/2017 at 6:51 am to Dr. Shanon Rosser, who verbally acknowledged these results. Electronically Signed   By: Garald Balding M.D.   On: 12/21/2017 06:54      Scheduled Meds: . cephALEXin  500 mg Oral Q12H  . cholecalciferol  2,000 Units Oral Daily  . cromolyn  1 drop Both Eyes QID  . memantine  28 mg Oral Daily   And  . donepezil  10 mg Oral  Daily  . fluticasone  2 spray Each Nare Daily  . folic acid  1 mg Oral Daily  . gabapentin  300 mg Oral Daily  . latanoprost  1 drop Both Eyes QHS  . mirabegron ER  50 mg Oral Daily  . pantoprazole  40 mg Oral Daily  . polyethylene glycol  17 g Oral Daily  . rosuvastatin  5 mg Oral Daily  . sucralfate  1 g Oral TID WC & HS   Continuous Infusions:   LOS: 0 days    Time spent: Total of 25 minutes spent with pt, greater than 50% of which was spent in discussion of  treatment, counseling and coordination of care   Chipper Oman, MD Pager: Text Page via [email or web address]   If 7PM-7AM, please contact night-coverage [email or web address] 12/22/2017, 4:05 PM   Note - This record has been created using [employer]. Chart creation errors have been sought, but may not always have been located. Such creation errors do not reflect on the standard of medical care.

## 2017-12-23 DIAGNOSIS — G609 Hereditary and idiopathic neuropathy, unspecified: Secondary | ICD-10-CM | POA: Diagnosis not present

## 2017-12-23 DIAGNOSIS — I10 Essential (primary) hypertension: Secondary | ICD-10-CM | POA: Diagnosis not present

## 2017-12-23 DIAGNOSIS — S301XXA Contusion of abdominal wall, initial encounter: Secondary | ICD-10-CM | POA: Diagnosis not present

## 2017-12-23 LAB — CBC WITH DIFFERENTIAL/PLATELET
BASOS ABS: 0 10*3/uL (ref 0.0–0.1)
BASOS PCT: 0 %
EOS ABS: 0.1 10*3/uL (ref 0.0–0.7)
EOS PCT: 1 %
HCT: 32.2 % — ABNORMAL LOW (ref 36.0–46.0)
Hemoglobin: 10.8 g/dL — ABNORMAL LOW (ref 12.0–15.0)
Lymphocytes Relative: 28 %
Lymphs Abs: 2 10*3/uL (ref 0.7–4.0)
MCH: 30.9 pg (ref 26.0–34.0)
MCHC: 33.5 g/dL (ref 30.0–36.0)
MCV: 92 fL (ref 78.0–100.0)
MONO ABS: 0.5 10*3/uL (ref 0.1–1.0)
MONOS PCT: 7 %
NEUTROS ABS: 4.6 10*3/uL (ref 1.7–7.7)
Neutrophils Relative %: 64 %
PLATELETS: 150 10*3/uL (ref 150–400)
RBC: 3.5 MIL/uL — ABNORMAL LOW (ref 3.87–5.11)
RDW: 13.7 % (ref 11.5–15.5)
WBC: 7.2 10*3/uL (ref 4.0–10.5)

## 2017-12-23 LAB — BASIC METABOLIC PANEL
ANION GAP: 8 (ref 5–15)
BUN: 17 mg/dL (ref 6–20)
CALCIUM: 8.7 mg/dL — AB (ref 8.9–10.3)
CO2: 27 mmol/L (ref 22–32)
CREATININE: 0.5 mg/dL (ref 0.44–1.00)
Chloride: 96 mmol/L — ABNORMAL LOW (ref 101–111)
GFR calc Af Amer: >60 mL/min (ref >60)
GFR calc non Af Amer: >60 mL/min (ref >60)
GLUCOSE: 92 mg/dL (ref 65–99)
Potassium: 4 mmol/L (ref 3.5–5.1)
Sodium: 131 mmol/L — ABNORMAL LOW (ref 135–145)

## 2017-12-23 MED ORDER — CEPHALEXIN 500 MG PO CAPS: 500.0000 mg | ORAL_CAPSULE | Freq: Two times a day (BID) | ORAL | 0 refills | Status: AC

## 2017-12-23 MED ORDER — SENNOSIDES-DOCUSATE SODIUM 8.6-50 MG PO TABS: 2.0000 | ORAL_TABLET | Freq: Two times a day (BID) | ORAL | 1 refills | Status: AC

## 2017-12-23 NOTE — Discharge Summary (Signed)
Physician Discharge Summary  [name]  QHU:[id number]  DOB: May 13, 1926  DOA: 12/21/2017 PCP: Cassandria Anger, MD  Admit date: 12/21/2017 Discharge date: 12/23/2017  Admitted From: SNF Disposition:  SNF   Recommendations for Outpatient Follow-up:  1. Follow up with SNF provider at earliest convenience  2. Please obtain BMP/CBC in one week to monitor electrolytes and Hgb  3. Urology follow up in Jun   Discharge Condition: Stable  CODE STATUS: DNR  Diet recommendation: Heart Healthy   Brief/Interim Summary: For full details see H&P/Progress note, but in brief, [name] is a 84 year old female with medical history significant for CLL, hypertension, peripheral neuropathy and GERD who presented to the emergency department complaining of abdominal pain.  Patient was recently admitted for UTI, treated with antibiotic and discharged on 5/30 to SNF due to significant weakness.  From the skilled nursing facility she complaining of abdominal pain and was sent back to the emergency department.  Upon ED evaluation CT abdomen and pelvis reveal large complex evolving hematoma within the right rectus abdominal muscle measuring approximately 24 cm.  Patient was admitted with working diagnosis of abdominal wall hematoma and further hemoglobin monitoring.  Subjective: Patient seen and examined, she continues to complaint about weakness. Hgb stable, abdominal tenderness. No BM since Thursday 5/30.   Discharge Diagnoses/Hospital Course:  Right rectus abdominal muscle hematoma Unclear etiology at this time, patient not on anticoagulation, however she was receiving Lovenox during hospital stay, felt that this is the probable culprit.  INR within normal limits.  Hemoglobin has dropped 4 g from 5/30.  Apply warm compresses. Hgb has stabilized. Hematoma will resolved in 6-8 weeks. Holding Aspirin for now. Patient stable for discharge to SNF for STR  UTI Klebsiella oxytocin from prior urine culture,  patient on Keflex. Antibiotic therapy for 1 more day   Constipation  Tap water enema  Continue Miralax, add Sennakot   General deconditioning Continue PT, out of bed as tolerated. Continue protein supplements  SNF   Dementia Continue memantine/donepezil Patient alert and oriented x3  Peripheral neuropathy Patient sleepy will d/c gabapentin  Monitor, may add Cymbalta if needed will help with neuropathy   All other chronic medical condition were stable during the hospitalization.  On the day of the discharge the patient's vitals were stable, and no other acute medical condition were reported by patient. the patient was felt safe to be discharge to SNF   Discharge Instructions  You were cared for by a hospitalist during your hospital stay. If you have any questions about your discharge medications or the care you received while you were in the hospital after you are discharged, you can call the unit and asked to speak with the hospitalist on call if the hospitalist that took care of you is not available. Once you are discharged, your primary care physician will handle any further medical issues. Please note that NO REFILLS for any discharge medications will be authorized once you are discharged, as it is imperative that you return to your primary care physician (or establish a relationship with a primary care physician if you do not have one) for your aftercare needs so that they can reassess your need for medications and monitor your lab values.  Discharge Instructions    Call MD for:  difficulty breathing, headache or visual disturbances   Complete by:  As directed    Call MD for:  extreme fatigue   Complete by:  As directed    Call MD for:  hives   Complete by:  As directed    Call MD for:  persistant dizziness or light-headedness   Complete by:  As directed    Call MD for:  persistant nausea and vomiting   Complete by:  As directed    Call MD for:  redness, tenderness, or  signs of infection (pain, swelling, redness, odor or green/yellow discharge around incision site)   Complete by:  As directed    Call MD for:  severe uncontrolled pain   Complete by:  As directed    Call MD for:  temperature >100.4   Complete by:  As directed    Diet - low sodium heart healthy   Complete by:  As directed    Increase activity slowly   Complete by:  As directed      Allergies as of 12/23/2017      Reactions   Pneumococcal Vaccines Swelling, Rash   Pneumococcal Vaccine-23 valent   Clarithromycin Other (See Comments)   REACTION: sore mouth   Hctz [hydrochlorothiazide]    Low Na   Oxycodone-aspirin Other (See Comments)   REACTION: horrible nightmares   Propoxyphene N-acetaminophen Other (See Comments)   REACTION: couldn't wake her up      Medication List    STOP taking these medications   aspirin 81 MG EC tablet   clotrimazole-betamethasone cream Commonly known as:  LOTRISONE   conjugated estrogens vaginal cream Commonly known as:  PREMARIN   Dexlansoprazole 30 MG capsule Commonly known as:  DEXILANT   gabapentin 300 MG capsule Commonly known as:  NEURONTIN   hydrochlorothiazide 12.5 MG capsule Commonly known as:  MICROZIDE   RABEprazole 20 MG tablet Commonly known as:  ACIPHEX   rosuvastatin 5 MG tablet Commonly known as:  CRESTOR     TAKE these medications   acetaminophen 500 MG tablet Commonly known as:  TYLENOL Take 500 mg by mouth 2 (two) times daily.   B-D INS SYRINGE 0.5CC/30GX1/2" 30G X 1/2" 0.5 ML Misc Generic drug:  Insulin Syringe-Needle U-100 USE TO ADMINISTER B12 INJECTIONS   BD INSULIN SYRINGE U/F 30G X 1/2" 0.5 ML Misc Generic drug:  Insulin Syringe-Needle U-100 USE TO ADMINISTER B12 INJECTIONS   CENTRUM SILVER 50+WOMEN Tabs Take 1 tablet by mouth daily.   cephALEXin 500 MG capsule Commonly known as:  KEFLEX Take 1 capsule (500 mg total) by mouth 2 (two) times daily for 1 day. What changed:  when to take this    cromolyn 4 % ophthalmic solution Commonly known as:  OPTICROM 1 DROP IN BOTH EYES FOUR TIMES A DAY   cyanocobalamin 1000 MCG/ML injection Commonly known as:  (VITAMIN B-12) ADMINISTER 1 CC SUBQ EVERY 4 WEEKS AS DIRECTED   NUTRITIONAL SUPPLEMENT PO Take 120 mLs by mouth 2 (two) times daily.   feeding supplement (ENSURE ENLIVE) Liqd Take 237 mLs by mouth 3 (three) times daily between meals.   fluconazole 150 MG tablet Commonly known as:  DIFLUCAN TAKE 1 TABLET (150 MG TOTAL) BY MOUTH ONCE FOR 1 DOSE. REPEAT IN 1 WEEK IF NEEDED.   fluticasone 50 MCG/ACT nasal spray Commonly known as:  FLONASE Place 2 sprays into both nostrils daily.   folic acid 1 MG tablet Commonly known as:  FOLVITE Take 1 mg by mouth daily. For restless legs   KLOR-CON M10 10 MEQ tablet Generic drug:  potassium chloride TAKE 1 TABLET (10 MEQ TOTAL) BY MOUTH DAILY.   latanoprost 0.005 % ophthalmic solution Commonly known as:  XALATAN PLACE  1 DROP INTO BOTH EYES AT BEDTIME.   loratadine 10 MG tablet Commonly known as:  CLARITIN TAKE 1 TABLET (10 MG TOTAL) BY MOUTH DAILY.   magic mouthwash w/lidocaine Soln Take 5 mLs by mouth 3 (three) times daily as needed for mouth pain. Swish, hold, spit   Memantine HCl-Donepezil HCl 28-10 MG Cp24 Take 1 capsule by mouth daily.   mirabegron ER 50 MG Tb24 tablet Commonly known as:  MYRBETRIQ Take 1 tablet (50 mg total) by mouth daily.   omeprazole 20 MG capsule Commonly known as:  PRILOSEC Take 20 mg by mouth daily.   polyethylene glycol packet Commonly known as:  MIRALAX / GLYCOLAX Take 17 g by mouth daily.   senna-docusate 8.6-50 MG tablet Commonly known as:  Senokot-S Take 2 tablets by mouth 2 (two) times daily.   sucralfate 1 g tablet Commonly known as:  CARAFATE Take 1 tablet (1 g total) by mouth 4 (four) times daily -  with meals and at bedtime.   triamcinolone ointment 0.1 % Commonly known as:  KENALOG Apply 1 application topically 2 (two)  times daily.   Vitamin D3 2000 units capsule Take 1 capsule (2,000 Units total) by mouth daily.      Contact information for after-discharge care    Destination    [facility] .   Service:  Skilled Nursing Contact information: [place] [phone]             Allergies  Allergen Reactions  . Pneumococcal Vaccines Swelling and Rash    Pneumococcal Vaccine-23 valent  . Clarithromycin Other (See Comments)    REACTION: sore mouth  . Hctz [Hydrochlorothiazide]     Low Na  . Oxycodone-Aspirin Other (See Comments)    REACTION: horrible nightmares  . Propoxyphene N-Acetaminophen Other (See Comments)    REACTION: couldn't wake her up    Consultations:  None    Procedures/Studies: Ct Abdomen Pelvis W Contrast  Result Date: 12/21/2017 CLINICAL DATA:  Acute onset of generalized abdominal pain. Microhematuria with blood cells in the urine. EXAM: CT ABDOMEN AND PELVIS WITH CONTRAST TECHNIQUE: Multidetector CT imaging of the abdomen and pelvis was performed using the standard protocol following bolus administration of intravenous contrast. CONTRAST:  148mL ISOVUE-300 IOPAMIDOL (ISOVUE-300) INJECTION 61% COMPARISON:  CT of the abdomen and pelvis performed 12/16/2017 FINDINGS: Lower chest: Mild scarring is noted at the lung bases. Scattered coronary artery calcifications are seen. Hepatobiliary: A nonspecific 3.1 cm fluid collection is again noted at the posterior margin of the right hepatic lobe. The gallbladder is unremarkable in appearance. The common bile duct remains normal in caliber. Pancreas: The pancreas is within normal limits. Spleen: The spleen is unremarkable in appearance. Adrenals/Urinary Tract: The adrenal glands are unremarkable in appearance. Mild right renal scarring is noted. Nonspecific left-sided perinephric stranding is noted. There is no evidence of hydronephrosis. No renal or ureteral stones are  identified. Stomach/Bowel: The stomach is unremarkable in appearance. The small bowel is within normal limits. The appendix is not visualized; there is no evidence for appendicitis. The colon is unremarkable in appearance. Vascular/Lymphatic: Scattered calcification is seen along the abdominal aorta and its branches. The abdominal aorta is otherwise grossly unremarkable. The inferior vena cava is grossly unremarkable. No retroperitoneal lymphadenopathy is seen. No pelvic sidewall lymphadenopathy is identified. Reproductive: The bladder is mildly distended, with diffuse irregular bladder wall thickening, new from the prior study, concerning for cystitis. The patient is status post hysterectomy. No suspicious  adnexal masses are seen. Other: A large complex evolving hematoma is noted within the right rectus abdominis musculature, measuring approximately 24 cm in length. Blood tracks adjacent to the right side of the bladder, along the right pelvic sidewall. Musculoskeletal: No acute osseous abnormalities are identified. Multilevel vacuum phenomenon is noted along the lumbar spine. The visualized musculature is otherwise unremarkable in appearance. IMPRESSION: 1. Large complex evolving hematoma within the right rectus abdominis musculature, measuring approximately 24 cm in length. Blood tracks adjacent to the right side of the bladder, along the right pelvic sidewall. This is new from the recent prior CT. 2. New diffuse irregular bladder wall thickening is concerning for cystitis. 3. Nonspecific 3.1 cm fluid collection again noted at the posterior margin of the right hepatic lobe. 4. Scattered coronary artery calcifications seen. 5. Mild right renal scarring noted. Aortic Atherosclerosis (ICD10-I70.0). These results were called by telephone at the time of interpretation on 12/21/2017 at 6:51 am to Dr. Shanon Rosser, who verbally acknowledged these results. Electronically Signed   By: Garald Balding M.D.   On: 12/21/2017  06:54   Ct Abdomen Pelvis W Contrast  Result Date: 12/16/2017 CLINICAL DATA:  Weakness, lethargy, previous UTI,  suprapubic pain EXAM: CT ABDOMEN AND PELVIS WITH CONTRAST TECHNIQUE: Multidetector CT imaging of the abdomen and pelvis was performed using the standard protocol following bolus administration of intravenous contrast. CONTRAST:  154mL ISOVUE-300 IOPAMIDOL (ISOVUE-300) INJECTION 61% COMPARISON:  08/24/2016 FINDINGS: Lower chest: Scattered coronary calcifications. No pleural or pericardial effusion. Visualized lung bases clear. Hepatobiliary: 3cm low attenuation collection at the posterior margin of the right hepatic lobe, previously 2.7 cm. No new liver lesion. Gallbladder is nondistended. No biliary ductal dilatation. Pancreas: Unremarkable. No pancreatic ductal dilatation or surrounding inflammatory changes. Spleen: Normal in size without focal abnormality. Adrenals/Urinary Tract: Normal adrenal glands. Normal renal parenchymal enhancement. No hydronephrosis. There is mild right ureterectasis down to the ureteral orifice, without radiodense calculus. The urinary bladder is physiologically distended. Stomach/Bowel: Stomach is decompressed. Small bowel nondilated. Normal appendix. Moderate proximal colonic fecal material, decompressed distally. Vascular/Lymphatic: Scattered aortoiliac calcified plaque without aneurysm or stenosis. Portal vein patent. Bilateral pelvic phleboliths. No abdominal or pelvic adenopathy. Reproductive: Status post hysterectomy. No adnexal masses. Other: No ascites.  No free air. Musculoskeletal: Spondylitic changes in the lower thoracic and lumbar spine. Thoracolumbar dextroscoliosis apex L2. Negative for fracture or worrisome bone lesion. IMPRESSION: 1. No acute findings. 2. Stable right ureterectasis without hydronephrosis or urolithiasis. 3. Coronary and aortoiliac  atherosclerosis (ICD10-170.0) Electronically Signed   By: Lucrezia Europe M.D.   On: 12/16/2017 16:03     Discharge Exam: Vitals:   12/22/17 2100 12/23/17 0557  BP: 131/67 132/65  Pulse: 86 81  Resp: 20 18  Temp: 97.6 F (36.4 C) 97.9 F (36.6 C)  SpO2: 96% 94%   Vitals:   12/22/17 0443 12/22/17 1331 12/22/17 2100 12/23/17 0557  BP: 130/72 125/77 131/67 132/65  Pulse: 87 (!) 103 86 81  Resp: 18 18 20 18   Temp: 98.2 F (36.8 C) 97.7 F (36.5 C) 97.6 F (36.4 C) 97.9 F (36.6 C)  TempSrc: Oral Oral Oral Oral  SpO2: 92% 94% 96% 94%  Weight:      Height:        General: NAD, sleepy  Cardiovascular: RRR, S1/S2 +, no rubs, no gallops Respiratory: CTA bilaterally, no wheezing, no rhonchi Abdominal: Soft, tenderness along the R rectus muscle, + BS  Extremities: no edema Neuro: AAOx3   The results of significant diagnostics  from this hospitalization (including imaging, microbiology, ancillary and laboratory) are listed below for reference.     Microbiology: Recent Results (from the past 240 hour(s))  Urine culture     Status: Abnormal   Collection Time: 12/16/17  4:33 PM  Result Value Ref Range Status   Specimen Description   Final    URINE, CATHETERIZED Performed at [facility] [address], [place], [place] [address]    Special Requests   Final    Normal Performed at [facility], [place] [address], [place], [place] [address]    Culture >=100,000 COLONIES/mL KLEBSIELLA OXYTOCA (A)  Final   Report Status 12/19/2017 FINAL  Final   Organism ID, Bacteria KLEBSIELLA OXYTOCA (A)  Final      Susceptibility   Klebsiella oxytoca - MIC*    AMPICILLIN >=32 RESISTANT Resistant     CEFAZOLIN 16 SENSITIVE Sensitive     CEFTRIAXONE <=1 SENSITIVE Sensitive     CIPROFLOXACIN <=0.25 SENSITIVE Sensitive     GENTAMICIN <=1 SENSITIVE Sensitive     IMIPENEM <=0.25 SENSITIVE Sensitive     NITROFURANTOIN 32 SENSITIVE Sensitive     TRIMETH/SULFA <=20 SENSITIVE Sensitive     AMPICILLIN/SULBACTAM 16 INTERMEDIATE Intermediate     PIP/TAZO <=4  SENSITIVE Sensitive     Extended ESBL NEGATIVE Sensitive     * >=100,000 COLONIES/mL KLEBSIELLA OXYTOCA  Urine culture     Status: None   Collection Time: 12/21/17  3:35 AM  Result Value Ref Range Status   Specimen Description   Final    URINE, CLEAN CATCH Performed at [facility] [address], [place], [place] [address]    Special Requests   Final    NONE Performed at [facility], [place] [address], [place], [place] [address]    Culture   Final    NO GROWTH Performed at [facility], [place] [address] [place], [place] [address]    Report Status 12/22/2017 FINAL  Final  MRSA PCR Screening     Status: None   Collection Time: 12/21/17 12:51 PM  Result Value Ref Range Status   MRSA by PCR NEGATIVE NEGATIVE Final    Comment:        The GeneXpert MRSA Assay (FDA approved for NASAL specimens only), is one component of a comprehensive MRSA colonization surveillance program. It is not intended to diagnose MRSA infection nor to guide or monitor treatment for MRSA infections. Performed at [facility], [place] [address] [place],  [address]      Labs: BNP (last 3 results) No results for input(s): BNP in the last 8760 hours. Basic Metabolic Panel: Recent Labs  Lab 12/16/17 1418 12/16/17 1921 12/17/17 0439 12/21/17 0335 12/23/17 0525  NA 132* 134* 134* 136 131*  K 3.8 3.6 3.6 4.2 4.0  CL 93* 96* 98* 98* 96*  CO2 28 27 25 31 27   GLUCOSE 108* 146* 89 159* 92  BUN 14 13 15 16 17   CREATININE 0.66 0.70 0.74 0.65 0.50  CALCIUM 9.2 9.0 8.8* 8.9 8.7*   Liver Function Tests: Recent Labs  Lab 12/21/17 0335  AST 28  ALT 24  ALKPHOS 39  BILITOT 0.4  PROT 6.6  ALBUMIN 3.4*   Recent Labs  Lab 12/21/17 0335  LIPASE 50   No results for input(s): AMMONIA in the last 168 hours. CBC: Recent Labs  Lab 12/17/17 0439 12/21/17 0335 12/21/17 1559 12/22/17 0600 12/23/17 0525  WBC 7.6  8.6 8.8 7.2 7.2   NEUTROABS  --  6.5  --   --  4.6  HGB 14.5 13.0 12.0 10.6* 10.8*  HCT 42.2 38.5 35.5* 31.8* 32.2*  MCV 90.2 91.9 93.2 93.3 92.0  PLT 218 152 162 142* 150   Cardiac Enzymes: No results for input(s): CKTOTAL, CKMB, CKMBINDEX, TROPONINI in the last 168 hours. BNP: Invalid input(s): POCBNP CBG: Recent Labs  Lab 12/16/17 1357  GLUCAP 109*   D-Dimer No results for input(s): DDIMER in the last 72 hours. Hgb A1c No results for input(s): HGBA1C in the last 72 hours. Lipid Profile No results for input(s): CHOL, HDL, LDLCALC, TRIG, CHOLHDL, LDLDIRECT in the last 72 hours. Thyroid function studies No results for input(s): TSH, T4TOTAL, T3FREE, THYROIDAB in the last 72 hours.  Invalid input(s): FREET3 Anemia work up No results for input(s): VITAMINB12, FOLATE, FERRITIN, TIBC, IRON, RETICCTPCT in the last 72 hours. Urinalysis    Component Value Date/Time   COLORURINE YELLOW 12/21/2017 0335   APPEARANCEUR HAZY (A) 12/21/2017 0335   LABSPEC 1.008 12/21/2017 0335   LABSPEC 1.010 04/03/2017 0950   PHURINE 7.0 12/21/2017 0335   GLUCOSEU NEGATIVE 12/21/2017 0335   GLUCOSEU NEGATIVE 09/25/2017 1030   GLUCOSEU Negative 04/03/2017 0950   HGBUR NEGATIVE 12/21/2017 0335   HGBUR trace-intact 07/07/2008 1058   BILIRUBINUR NEGATIVE 12/21/2017 0335   BILIRUBINUR negative 11/16/2017 1653   BILIRUBINUR neg 11/02/2017 1524   BILIRUBINUR Negative 04/03/2017 0950   KETONESUR NEGATIVE 12/21/2017 0335   PROTEINUR NEGATIVE 12/21/2017 0335   UROBILINOGEN 0.2 11/16/2017 1653   UROBILINOGEN 0.2 09/25/2017 1030   UROBILINOGEN 0.2 04/03/2017 0950   NITRITE NEGATIVE 12/21/2017 0335   LEUKOCYTESUR NEGATIVE 12/21/2017 0335   LEUKOCYTESUR Trace 04/03/2017 0950   Sepsis Labs Invalid input(s): PROCALCITONIN,  WBC,  LACTICIDVEN Microbiology Recent Results (from the past 240 hour(s))  Urine culture     Status: Abnormal   Collection Time: 12/16/17  4:33 PM  Result Value Ref Range Status   Specimen  Description   Final    URINE, CATHETERIZED Performed at [facility], [place] [address] [place], [place] [address]    Special Requests   Final    Normal Performed at [facility], [place] [address], [place], [place] [address]    Culture >=100,000 COLONIES/mL KLEBSIELLA OXYTOCA (A)  Final   Report Status 12/19/2017 FINAL  Final   Organism ID, Bacteria KLEBSIELLA OXYTOCA (A)  Final      Susceptibility   Klebsiella oxytoca - MIC*    AMPICILLIN >=32 RESISTANT Resistant     CEFAZOLIN 16 SENSITIVE Sensitive     CEFTRIAXONE <=1 SENSITIVE Sensitive     CIPROFLOXACIN <=0.25 SENSITIVE Sensitive     GENTAMICIN <=1 SENSITIVE Sensitive     IMIPENEM <=0.25 SENSITIVE Sensitive     NITROFURANTOIN 32 SENSITIVE Sensitive     TRIMETH/SULFA <=20 SENSITIVE Sensitive     AMPICILLIN/SULBACTAM 16 INTERMEDIATE Intermediate     PIP/TAZO <=4 SENSITIVE Sensitive     Extended ESBL NEGATIVE Sensitive     * >=100,000 COLONIES/mL KLEBSIELLA OXYTOCA  Urine culture     Status: None   Collection Time: 12/21/17  3:35 AM  Result Value Ref Range Status   Specimen Description   Final    URINE, CLEAN CATCH Performed at [facility] [address], [place], [place] [address]    Special Requests   Final    NONE Performed at [facility], [place] [address] [place], [place]  [address]    Culture   Final    NO GROWTH Performed at [facility], [place] [address], [place], [place] [address]    Report Status 12/22/2017 FINAL  Final  MRSA PCR Screening     Status: None   Collection Time: 12/21/17 12:51 PM  Result Value Ref Range Status   MRSA by PCR NEGATIVE NEGATIVE Final    Comment:        The GeneXpert MRSA Assay (FDA approved for NASAL specimens only), is one component of a comprehensive MRSA colonization surveillance program. It is not intended to diagnose MRSA infection nor to guide or monitor treatment for MRSA infections. Performed  at [facility], [place] [address], [place], [place] [address]      Time coordinating discharge: 32 minutes  SIGNED:  Chipper Oman, MD  [facility] 12/23/2017, 12:01 PM  Pager please text page via  [email or web address]  Note - This record has been created using [employer]. Chart creation errors have been sought, but may not always have been located. Such creation errors do not reflect on the standard of medical care.

## 2017-12-23 NOTE — Progress Notes (Signed)
Patient had a moderate amount of pasty bowel movement just before being transported back to [facility].

## 2017-12-23 NOTE — Progress Notes (Signed)
CSW following for discharge plan. CSW alerted by MD that patient is ready for discharge. CSW noting that patient is a recent readmit, with last full assessment completed on 12/19/17 (see below). CSW met with patient and discussed return to SNF. Patient agreeable to return to [facility]. CSW to follow for return to SNF today.  Laveda Abbe, [place] Clinical Social Worker [phone]      Clinical Social Work Assessment  Patient Details  Name: [name] MRN: [id number] Date of Birth: 1926-02-21  Date of referral:  12/19/17               Reason for consult:  Facility Placement                 Permission sought to share information with:  Family Supports Permission granted to share information::  Yes, Verbal Permission Granted             Name::     son Jori Moll and grandson Acupuncturist::                Relationship::                Contact Information:     Housing/Transportation Living arrangements for the past 2 months:  Single Family Home Source of Information:  Patient, Adult Children Patient Interpreter Needed:  None Criminal Activity/Legal Involvement Pertinent to Current Situation/Hospitalization:  No - Comment as needed Significant Relationships:  Adult Children, Other Family Members Lives with:  Self, Adult Children Do you feel safe going back to the place where you live?  Yes Need for family participation in patient care:  Yes (Comment)(son and grandson involved)  Care giving concerns:  Pt admitted from home where she technically resides alone however in past few weeks son has been staying with her as "she has not been feeling well and needed a little extra help." At baseline uses walker independently, needs help into shower (has shower chair). Currently admitted under observation for UTI. Pt also mentions she "broke her pelvis a while back."   Facilities manager / plan:  CSW consulted to assist with SNF placement. Met with pt at bedside- she  was alert and oriented x 4. (was confused upon admission) She is familiar with nature of short term rehab at SNF as she has been to [facility] twice in the past (last time in December 2018). Prefers [facility] again if available. CSW completed FL2 and made referrals- [facility] accepted and started insurance authorization request. Updated grandson (unable to reach son but grandson states he will inform him). Unable to locate pt in [place] [facility] system to verify PASRR- will call [place] [facility] to rectify.  Plan: SNF ([facility]) for short term rehab at [place] Barriers: PASRR, insurance auth   Employment status:  Retired Engineer, maintenance (IT)) PT Recommendations:  [place], [place] / Referral to community resources:  [place]  Patient/Family's Response to care:  appreciative  Patient/Family's Understanding of and Emotional Response to Diagnosis, Current Treatment, and Prognosis:  Pt demonstrates good understanding of her treatment her- also fairly good historian. Emotionally positive -"Rehab has helped me a lot before and I'd like to get back to being able to get up to my walker again."  Emotional Assessment Appearance:  Appears stated age Attitude/Demeanor/Rapport:  Engaged Affect (typically observed):  Accepting, Pleasant Orientation:  Oriented to Self, Oriented to Place, Oriented  to  Time, Oriented to Situation Alcohol / Substance use:  Not Applicable Psych involvement (Current and /or in the community):  No (Comment)  Discharge Needs  Concerns to be addressed:  Discharge Planning Concerns Readmission within the last 30 days:  No Current discharge risk:  Dependent with Mobility Barriers to Discharge:  Continued Medical Work up, [employer], [place] 12/19/2017, 10:05 AM  [phone]

## 2017-12-23 NOTE — NC FL2 (Signed)
MEDICAID FL2 LEVEL OF CARE SCREENING TOOL     IDENTIFICATION  Patient Name: [name] Birthdate: 1926/04/14 Sex: female Admission Date (Current Location): 12/21/2017  [facility] and [place] Number:  Herbalist and Address:  [facility],  [place] [address] [place]      Provider Number: [id number]  Attending Physician Name and Address:  Patrecia Pour, Christean Grief, MD  Relative Name and Phone Number:       Current Level of Care: Hospital Recommended Level of Care: [place] Prior Approval Number:    Date Approved/Denied:   PASRR Number:    Discharge Plan: SNF    Current Diagnoses: Patient Active Problem List   Diagnosis Date Noted  . Rectus sheath hematoma 12/21/2017  . Altered mental status 12/16/2017  . Diarrhea 09/19/2017  . Constipation 09/12/2017  . Alzheimer disease 07/25/2017  . Neck pain on left side 07/25/2017  . Urinary tract infection, recurrent 06/12/2017  . Thrush 02/10/2017  . Frequent urination 02/10/2017  . Osteoporosis 11/23/2016  . Abdominal pain 08/16/2016  . Urinary frequency 08/16/2016  . Peripheral edema 08/16/2016  . General weakness 08/16/2016  . Gait disorder 08/16/2016  . Dysuria 08/03/2016  . Closed displaced simple supracondylar fracture of right humerus without intercondylar fracture 05/10/2016  . Urinary tract infection without hematuria 05/10/2016  . Syncope 05/10/2016  . Humeral head fracture, right, closed, initial encounter   . Burning tongue 03/25/2016  . Post concussion syndrome 03/03/2016  . Fall 03/03/2016  . Cystocele with prolapse 03/05/2015  . Dehydration 02/08/2015  . Hyponatremia 02/08/2015  . Well adult exam 10/01/2014  . Grief at loss of child 12/11/2013  . Irregular heart beat 09/29/2013  . Humeral head fracture 08/26/2013  . Actinic keratoses 08/26/2013  . Carotid bruit 05/02/2012  . Anxiety 03/17/2011  . ANEMIA, PERNICIOUS, HX OF 10/10/2007  . Peripheral  neuropathy ([place]) 09/04/2007  . Chronic fatigue 08/06/2007  . Chronic lymphocytic leukemia ([place]) 06/17/2007  . Allergic rhinitis 06/17/2007  . OSTEOARTHRITIS 06/17/2007  . B12 deficiency 06/07/2007  . GERD (gastroesophageal reflux disease) 04/30/2007  . Dyslipidemia 12/19/2006  . Essential hypertension 12/19/2006  . DIVERTICULOSIS, COLON 05/19/2003  . COLONIC POLYPS, HX OF 01/10/2000    Orientation RESPIRATION BLADDER Height & Weight     Self, Time, Situation, Place  Normal Continent, Incontinent Weight: 136 lb (61.7 kg) Height:  5\' 4"  (162.6 cm)  BEHAVIORAL SYMPTOMS/MOOD NEUROLOGICAL BOWEL NUTRITION STATUS      Continent Diet(heart healthy)  AMBULATORY STATUS COMMUNICATION OF NEEDS Skin   Extensive Assist Verbally Other (Comment)(MASD on buttocks with foam dressing)                       Personal Care Assistance Level of Assistance  Bathing, Feeding, Dressing Bathing Assistance: Maximum assistance Feeding assistance: Limited assistance Dressing Assistance: Maximum assistance     Functional Limitations Info  Sight, Hearing, Speech Sight Info: Adequate Hearing Info: Adequate Speech Info: Adequate    SPECIAL CARE FACTORS FREQUENCY  PT (By licensed PT), OT (By licensed OT)     PT Frequency: 5x/wk OT Frequency: 5x/wk            Contractures Contractures Info: Not present    Additional Factors Info  Code Status, Allergies Code Status Info: Full Allergies Info: Pneumococcal Vaccines, Clarithromycin, Hctz Hydrochlorothiazide, Oxycodone-aspirin, Propoxyphene N-acetaminophen           Current Medications (12/23/2017):  This is the current hospital active medication list  Current Facility-Administered Medications  Medication Dose Route Frequency Provider Last Rate Last Dose  . acetaminophen (TYLENOL) tablet 650 mg  650 mg Oral Q6H PRN Mariel Aloe, MD   650 mg at 12/22/17 0515   Or  . acetaminophen (TYLENOL) suppository 650 mg  650 mg Rectal Q6H PRN Mariel Aloe, MD      . cephALEXin (KEFLEX) capsule 500 mg  500 mg Oral Q12H Mariel Aloe, MD   500 mg at 12/23/17 0945  . cholecalciferol (VITAMIN D) tablet 2,000 Units  2,000 Units Oral Daily Mariel Aloe, MD   2,000 Units at 12/23/17 0945  . cromolyn (OPTICROM) 4 % ophthalmic solution 1 drop  1 drop Both Eyes QID Mariel Aloe, MD   1 drop at 12/23/17 0946  . memantine (NAMENDA XR) 24 hr capsule 28 mg  28 mg Oral Daily Eudelia Bunch, RPH   28 mg at 12/23/17 0945   And  . donepezil (ARICEPT) tablet 10 mg  10 mg Oral Daily Leodis Sias T, RPH   10 mg at 12/23/17 0945  . fluticasone (FLONASE) 50 MCG/ACT nasal spray 2 spray  2 spray Each Nare Daily Mariel Aloe, MD   2 spray at 12/23/17 0945  . folic acid (FOLVITE) tablet 1 mg  1 mg Oral Daily Mariel Aloe, MD   1 mg at 12/23/17 0945  . gabapentin (NEURONTIN) capsule 300 mg  300 mg Oral Daily Mariel Aloe, MD   300 mg at 12/23/17 0945  . latanoprost (XALATAN) 0.005 % ophthalmic solution 1 drop  1 drop Both Eyes QHS Mariel Aloe, MD   1 drop at 12/22/17 2137  . mirabegron ER (MYRBETRIQ) tablet 50 mg  50 mg Oral Daily Mariel Aloe, MD   50 mg at 12/23/17 0946  . pantoprazole (PROTONIX) EC tablet 40 mg  40 mg Oral Daily Mariel Aloe, MD   40 mg at 12/23/17 0945  . polyethylene glycol (MIRALAX / GLYCOLAX) packet 17 g  17 g Oral Daily Mariel Aloe, MD   17 g at 12/23/17 0947  . protein supplement (PREMIER PROTEIN) liquid  2 oz Oral QID Patrecia Pour, Christean Grief, MD   2 oz at 12/23/17 0946  . rosuvastatin (CRESTOR) tablet 5 mg  5 mg Oral Daily Mariel Aloe, MD   5 mg at 12/23/17 0945  . sucralfate (CARAFATE) tablet 1 g  1 g Oral TID WC & HS Mariel Aloe, MD   1 g at 12/23/17 9326     Discharge Medications: Please see discharge summary for a list of discharge medications.  Relevant Imaging Results:  Relevant Lab Results:   Additional Information SS#: [id number]  Geralynn Ochs, LCSW

## 2017-12-23 NOTE — Progress Notes (Addendum)
Discharge to: [place] Anticipated discharge date: 12/23/17 Family notified: Yes, by phone Transportation by: [facility]  Report #: [phone], Room [address]B  E. Lopez signing off.  Laveda Abbe LCSW [phone]

## 2017-12-23 NOTE — Progress Notes (Signed)
Patient complaining of constipation, tap water enema given, with negative result, no fecal impaction felt.Patient has not voided this AM, bladder scan done only 157 ml recorded, patient feels that she needs to void,also will refuse foley catheter.

## 2017-12-27 ENCOUNTER — Other Ambulatory Visit: Payer: Self-pay | Admitting: Internal Medicine

## 2017-12-28 ENCOUNTER — Ambulatory Visit: Payer: Medicare Other

## 2017-12-28 NOTE — Progress Notes (Deleted)
Subjective:   [name] is a 84 y.o. female who presents for Medicare Annual (Subsequent) preventive examination.  Review of Systems:  No ROS.  Medicare Wellness Visit. Additional risk factors are reflected in the social history.    Sleep patterns: {SX; SLEEP PATTERNS:18802::"feels rested on waking","does not get up to void","gets up *** times nightly to void","sleeps *** hours nightly"}.    Home Safety/Smoke Alarms: Feels safe in home. Smoke alarms in place.  Living environment; residence and Firearm Safety: {Rehab home environment / accessibility:30080::"no firearms","firearms stored safely"}. Seat Belt Safety/Bike Helmet: Wears seat belt.     Objective:     Vitals: There were no vitals taken for this visit.  There is no height or weight on file to calculate BMI.  Advanced Directives 12/21/2017 12/18/2017 12/17/2017 09/19/2017 06/19/2017 05/29/2017 05/12/2017  Does Patient Have a Medical Advance Directive? No No No Yes Yes Yes Yes  Type of Advance Directive - - Programmer, multimedia of [place];Living will Healthcare Power of [place]  Does patient want to make changes to medical advance directive? - - - - - - -  Copy of [place] in Chart? - - - - - No - copy requested -  Would patient like information on creating a medical advance directive? No - Patient declined (No Data) Yes (Inpatient - patient requests chaplain consult to create a medical advance directive) - - - -    Tobacco Social History   Tobacco Use  Smoking Status Never Smoker  Smokeless Tobacco Never Used     Counseling given: Not Answered  Past Medical History:  Diagnosis Date  . Allergy    rhinitis  . Chronic ethmoidal sinusitis   . Diverticulosis   . Diverticulosis of colon   . GERD (gastroesophageal reflux disease)   . Glaucoma   . Hx of colonic polyps   . Hyperlipidemia   . Hypertension   . Hypogammaglobulinemia ([place])    Monthly IVIG  . Leukemia ([place])    Chronic lymphocytic leukemia  . Osteoarthritis   . Peripheral neuropathy   . Pernicious anemia   . UTI (lower urinary tract infection) 2010  . Vitamin B12 deficiency    Past Surgical History:  Procedure Laterality Date  . ABDOMINAL HYSTERECTOMY  1987  . bilateral cataracts  2007  . bladder tack  1987  . L4-L5 Laminetomy  1999  . TUBAL LIGATION    . tubaligation  1961  . ureter reconstruction  1992   Family History  Problem Relation Age of Onset  . Jaundice Father   . Stroke Son 61       in NH  . Cancer Mother        uterine  . Diabetes Mother   . Other Sister        brain tumor  . Cancer Sister   . Hypertension Other   . Cancer Other        breast  . Thyroid disease Sister   . Cancer Sister        thyroid cancer   Social History   Socioeconomic History  . Marital status: Widowed    Spouse name: Not on file  . Number of children: Not on file  . Years of education: Not on file  . Highest education level: Not on file  Occupational History  . Not on file  Social Needs  . Financial resource strain: Not on file  . Food insecurity:  Worry: Not on file    Inability: Not on file  . Transportation needs:    Medical: Not on file    Non-medical: Not on file  Tobacco Use  . Smoking status: Never Smoker  . Smokeless tobacco: Never Used  Substance and Sexual Activity  . Alcohol use: No    Alcohol/week: 0.0 oz  . Drug use: No  . Sexual activity: Not Currently  Lifestyle  . Physical activity:    Days per week: Not on file    Minutes per session: Not on file  . Stress: Not on file  Relationships  . Social connections:    Talks on phone: Not on file    Gets together: Not on file    Attends religious service: Not on file    Active member of club or organization: Not on file    Attends meetings of clubs or organizations: Not on file    Relationship status: Not on file  Other Topics Concern  . Not on file  Social History  Narrative  . Not on file    Outpatient Encounter Medications as of 12/28/2017  Medication Sig  . acetaminophen (TYLENOL) 500 MG tablet Take 500 mg by mouth 2 (two) times daily.   . B-D INS SYRINGE 0.5CC/30GX1/2" 30G X 1/2" 0.5 ML MISC USE TO ADMINISTER B12 INJECTIONS  . Cholecalciferol (VITAMIN D3) 2000 units capsule Take 1 capsule (2,000 Units total) by mouth daily.  . cromolyn (OPTICROM) 4 % ophthalmic solution 1 DROP IN BOTH EYES FOUR TIMES A DAY  . cyanocobalamin (,VITAMIN B-12,) 1000 MCG/ML injection ADMINISTER 1 CC SUBQ EVERY 4 WEEKS AS DIRECTED  . donepezil (ARICEPT) 5 MG tablet TAKE 1 TABLET BY MOUTH EVERYDAY AT BEDTIME  . feeding supplement, ENSURE ENLIVE, (ENSURE ENLIVE) LIQD Take 237 mLs by mouth 3 (three) times daily between meals. (Patient not taking: Reported on 12/21/2017)  . fluconazole (DIFLUCAN) 150 MG tablet TAKE 1 TABLET (150 MG TOTAL) BY MOUTH ONCE FOR 1 DOSE. REPEAT IN 1 WEEK IF NEEDED.  . fluticasone (FLONASE) 50 MCG/ACT nasal spray Place 2 sprays into both nostrils daily.  . folic acid (FOLVITE) 1 MG tablet Take 1 mg by mouth daily. For restless legs  . Insulin Syringe-Needle U-100 (BD INSULIN SYRINGE U/F) 30G X 1/2" 0.5 ML MISC USE TO ADMINISTER B12 INJECTIONS  . KLOR-CON M10 10 MEQ tablet TAKE 1 TABLET (10 MEQ TOTAL) BY MOUTH DAILY.  Marland Kitchen latanoprost (XALATAN) 0.005 % ophthalmic solution PLACE 1 DROP INTO BOTH EYES AT BEDTIME.  Marland Kitchen loratadine (CLARITIN) 10 MG tablet TAKE 1 TABLET (10 MG TOTAL) BY MOUTH DAILY.  . magic mouthwash w/lidocaine SOLN Take 5 mLs by mouth 3 (three) times daily as needed for mouth pain. Swish, hold, spit  . Memantine HCl-Donepezil HCl 28-10 MG CP24 Take 1 capsule by mouth daily.  . mirabegron ER (MYRBETRIQ) 50 MG TB24 tablet Take 1 tablet (50 mg total) by mouth daily.  . Multiple Vitamins-Minerals (CENTRUM SILVER 50+WOMEN) TABS Take 1 tablet by mouth daily.   . Nutritional Supplements (NUTRITIONAL SUPPLEMENT PO) Take 120 mLs by mouth 2 (two) times  daily.  Marland Kitchen omeprazole (PRILOSEC) 20 MG capsule Take 20 mg by mouth daily.  . polyethylene glycol (MIRALAX / GLYCOLAX) packet Take 17 g by mouth daily.  Marland Kitchen senna-docusate (SENOKOT-S) 8.6-50 MG tablet Take 2 tablets by mouth 2 (two) times daily.  . sucralfate (CARAFATE) 1 g tablet Take 1 tablet (1 g total) by mouth 4 (four) times daily -  with meals  and at bedtime.  . triamcinolone ointment (KENALOG) 0.1 % Apply 1 application topically 2 (two) times daily.   No facility-administered encounter medications on file as of 12/28/2017.     Activities of Daily Living In your present state of health, do you have any difficulty performing the following activities: 12/21/2017 12/17/2017  Hearing? Tempie Donning  Vision? Y Y  Difficulty concentrating or making decisions? Y Y  Comment - -  Walking or climbing stairs? Y N  Dressing or bathing? N N  Doing errands, shopping? Tempie Donning  Some recent data might be hidden    Patient Care Team: Plotnikov, Evie Lacks, MD as PCP - General    Assessment:   This is a routine wellness examination for [facility]. Physical assessment deferred to PCP.   Exercise Activities and Dietary recommendations   Diet (meal preparation, eat out, water intake, caffeinated beverages, dairy products, fruits and vegetables): {Desc; diets:16563}   Goals    None      Fall Risk Fall Risk  11/16/2017 11/10/2017 09/25/2017 08/29/2016 12/03/2015  Falls in the past year? No No No Yes No  Number falls in past yr: - - - 2 or more -  Comment - - - fell while walking to  mailbox -  Injury with Fall? - - - Yes -  Risk Factor Category  - - - High Fall Risk -  Risk for fall due to : - - - Impaired balance/gait;Impaired mobility;Impaired vision -  Follow up - - - Education provided -    Depression Screen PHQ 2/9 Scores 11/16/2017 11/10/2017 09/25/2017 08/29/2016  PHQ - 2 Score 0 0 1 1     Cognitive Function MMSE - Mini Mental State Exam 08/29/2016  Orientation to time 5  Orientation to Place 5  Registration 3    Attention/ Calculation 5  Recall 3  Language- name 2 objects 2  Language- repeat 1  Language- follow 3 step command 3  Language- read & follow direction 1  Write a sentence 1  Copy design 1  Total score 30        Immunization History  Administered Date(s) Administered  . Influenza Split 05/02/2012  . Influenza Whole 05/18/2008, 06/01/2010, 05/03/2011  . Influenza, High Dose Seasonal PF 05/01/2017  . Influenza,inj,Quad PF,6+ Mos 05/01/2013, 03/12/2014, 04/29/2015  . Influenza-Unspecified 05/22/2016  . Pneumococcal Conjugate-13 04/01/2014  . Pneumococcal Polysaccharide-23 04/07/2004, 02/21/2012  . Td 06/08/2010   Screening Tests Health Maintenance  Topic Date Due  . INFLUENZA VACCINE  02/21/2018  . TETANUS/TDAP  06/08/2020  . DEXA SCAN  Completed  . PNA vac Low Risk Adult  Completed       Plan:     I have personally reviewed and noted the following in the patient's chart:   . Medical and social history . Use of alcohol, tobacco or illicit drugs  . Current medications and supplements . Functional ability and status . Nutritional status . Physical activity . Advanced directives . List of other physicians . Vitals . Screenings to include cognitive, depression, and falls . Referrals and appointments  In addition, I have reviewed and discussed with patient certain preventive protocols, quality metrics, and best practice recommendations. A written personalized care plan for preventive services as well as general preventive health recommendations were provided to patient.     Michiel Cowboy, RN  12/28/2017

## 2017-12-29 ENCOUNTER — Other Ambulatory Visit: Payer: Self-pay | Admitting: Internal Medicine

## 2017-12-29 DIAGNOSIS — C9110 Chronic lymphocytic leukemia of B-cell type not having achieved remission: Secondary | ICD-10-CM

## 2017-12-31 DIAGNOSIS — Z7982 Long term (current) use of aspirin: Secondary | ICD-10-CM | POA: Diagnosis not present

## 2017-12-31 DIAGNOSIS — Z9181 History of falling: Secondary | ICD-10-CM | POA: Diagnosis not present

## 2017-12-31 DIAGNOSIS — R911 Solitary pulmonary nodule: Secondary | ICD-10-CM | POA: Diagnosis not present

## 2017-12-31 DIAGNOSIS — Z87440 Personal history of urinary (tract) infections: Secondary | ICD-10-CM

## 2017-12-31 DIAGNOSIS — R269 Unspecified abnormalities of gait and mobility: Secondary | ICD-10-CM

## 2017-12-31 DIAGNOSIS — Z7951 Long term (current) use of inhaled steroids: Secondary | ICD-10-CM

## 2017-12-31 DIAGNOSIS — D6959 Other secondary thrombocytopenia: Secondary | ICD-10-CM | POA: Diagnosis not present

## 2017-12-31 DIAGNOSIS — C9110 Chronic lymphocytic leukemia of B-cell type not having achieved remission: Secondary | ICD-10-CM

## 2017-12-31 DIAGNOSIS — D801 Nonfamilial hypogammaglobulinemia: Secondary | ICD-10-CM

## 2017-12-31 DIAGNOSIS — M6281 Muscle weakness (generalized): Secondary | ICD-10-CM

## 2018-01-17 ENCOUNTER — Inpatient Hospital Stay: Payer: Medicare Other | Attending: Oncology

## 2018-01-27 ENCOUNTER — Other Ambulatory Visit: Payer: Self-pay | Admitting: Internal Medicine

## 2018-02-07 ENCOUNTER — Ambulatory Visit: Payer: Medicare Other | Admitting: Sports Medicine

## 2018-02-14 ENCOUNTER — Inpatient Hospital Stay: Payer: Medicare Other | Attending: Oncology

## 2018-02-14 VITALS — BP 102/73 | HR 98 | Temp 98.4°F | Resp 19

## 2018-02-14 DIAGNOSIS — D801 Nonfamilial hypogammaglobulinemia: Secondary | ICD-10-CM | POA: Diagnosis not present

## 2018-02-14 DIAGNOSIS — C9110 Chronic lymphocytic leukemia of B-cell type not having achieved remission: Secondary | ICD-10-CM

## 2018-02-14 DIAGNOSIS — C9190 Lymphoid leukemia, unspecified not having achieved remission: Secondary | ICD-10-CM | POA: Insufficient documentation

## 2018-02-14 DIAGNOSIS — Z79899 Other long term (current) drug therapy: Secondary | ICD-10-CM | POA: Insufficient documentation

## 2018-02-14 MED ORDER — DIPHENHYDRAMINE HCL 25 MG PO CAPS
ORAL_CAPSULE | ORAL | Status: AC
  Filled 2018-02-14: qty 1

## 2018-02-14 MED ORDER — IMMUNE GLOBULIN (HUMAN) 10 GM/100ML IV SOLN
30.0000 g | Freq: Once | INTRAVENOUS | Status: AC
  Administered 2018-02-14: 30 g via INTRAVENOUS
  Filled 2018-02-14: qty 200

## 2018-02-14 MED ORDER — DIPHENHYDRAMINE HCL 25 MG PO CAPS
25.0000 mg | ORAL_CAPSULE | Freq: Once | ORAL | Status: AC
  Administered 2018-02-14: 25 mg via ORAL

## 2018-02-14 NOTE — Patient Instructions (Signed)

## 2018-03-14 ENCOUNTER — Inpatient Hospital Stay: Payer: Medicare Other | Attending: Oncology

## 2018-03-14 ENCOUNTER — Other Ambulatory Visit: Payer: Self-pay

## 2018-03-14 ENCOUNTER — Inpatient Hospital Stay (HOSPITAL_BASED_OUTPATIENT_CLINIC_OR_DEPARTMENT_OTHER): Payer: Medicare Other | Admitting: Oncology

## 2018-03-14 ENCOUNTER — Encounter: Payer: Self-pay | Admitting: General Practice

## 2018-03-14 ENCOUNTER — Inpatient Hospital Stay: Payer: Medicare Other

## 2018-03-14 VITALS — BP 123/76 | HR 82 | Temp 97.5°F | Resp 18

## 2018-03-14 VITALS — BP 110/71 | HR 78 | Temp 98.6°F | Resp 18 | Ht 64.0 in | Wt 131.5 lb

## 2018-03-14 DIAGNOSIS — D801 Nonfamilial hypogammaglobulinemia: Secondary | ICD-10-CM

## 2018-03-14 DIAGNOSIS — C9190 Lymphoid leukemia, unspecified not having achieved remission: Secondary | ICD-10-CM | POA: Diagnosis present

## 2018-03-14 DIAGNOSIS — D6959 Other secondary thrombocytopenia: Secondary | ICD-10-CM

## 2018-03-14 DIAGNOSIS — Z79899 Other long term (current) drug therapy: Secondary | ICD-10-CM

## 2018-03-14 DIAGNOSIS — Z87440 Personal history of urinary (tract) infections: Secondary | ICD-10-CM | POA: Diagnosis not present

## 2018-03-14 DIAGNOSIS — R918 Other nonspecific abnormal finding of lung field: Secondary | ICD-10-CM

## 2018-03-14 DIAGNOSIS — C9110 Chronic lymphocytic leukemia of B-cell type not having achieved remission: Secondary | ICD-10-CM

## 2018-03-14 LAB — CBC WITH DIFFERENTIAL (CANCER CENTER ONLY)
BASOS ABS: 0 10*3/uL (ref 0.0–0.1)
BASOS PCT: 1 %
EOS ABS: 0.1 10*3/uL (ref 0.0–0.5)
Eosinophils Relative: 2 %
HCT: 43.4 % (ref 34.8–46.6)
HEMOGLOBIN: 14.3 g/dL (ref 11.6–15.9)
Lymphocytes Relative: 37 %
Lymphs Abs: 1.7 10*3/uL (ref 0.9–3.3)
MCH: 30.8 pg (ref 25.1–34.0)
MCHC: 33 g/dL (ref 31.5–36.0)
MCV: 93.5 fL (ref 79.5–101.0)
Monocytes Absolute: 0.3 10*3/uL (ref 0.1–0.9)
Monocytes Relative: 7 %
NEUTROS PCT: 53 %
Neutro Abs: 2.5 10*3/uL (ref 1.5–6.5)
Platelet Count: 123 10*3/uL — ABNORMAL LOW (ref 145–400)
RBC: 4.64 MIL/uL (ref 3.70–5.45)
RDW: 15.3 % — ABNORMAL HIGH (ref 11.2–14.5)
WBC: 4.6 10*3/uL (ref 3.9–10.3)

## 2018-03-14 LAB — CMP (CANCER CENTER ONLY)
ALK PHOS: 53 U/L (ref 38–126)
ALT: 31 U/L (ref 0–44)
AST: 24 U/L (ref 15–41)
Albumin: 3.5 g/dL (ref 3.5–5.0)
Anion gap: 7 (ref 5–15)
BUN: 25 mg/dL — ABNORMAL HIGH (ref 8–23)
CALCIUM: 9.4 mg/dL (ref 8.9–10.3)
CHLORIDE: 109 mmol/L (ref 98–111)
CO2: 25 mmol/L (ref 22–32)
CREATININE: 0.8 mg/dL (ref 0.44–1.00)
Glucose, Bld: 94 mg/dL (ref 70–99)
Potassium: 4.1 mmol/L (ref 3.5–5.1)
Sodium: 141 mmol/L (ref 135–145)
Total Bilirubin: 0.3 mg/dL (ref 0.3–1.2)
Total Protein: 7 g/dL (ref 6.5–8.1)

## 2018-03-14 MED ORDER — IMMUNE GLOBULIN (HUMAN) 10 GM/100ML IV SOLN
30.0000 g | Freq: Once | INTRAVENOUS | Status: AC
  Administered 2018-03-14: 30 g via INTRAVENOUS
  Filled 2018-03-14: qty 100

## 2018-03-14 MED ORDER — DIPHENHYDRAMINE HCL 25 MG PO CAPS
25.0000 mg | ORAL_CAPSULE | Freq: Once | ORAL | Status: AC
  Administered 2018-03-14: 25 mg via ORAL

## 2018-03-14 MED ORDER — DIPHENHYDRAMINE HCL 25 MG PO CAPS
ORAL_CAPSULE | ORAL | Status: AC
  Filled 2018-03-14: qty 1

## 2018-03-14 NOTE — Progress Notes (Signed)
  [place] OFFICE PROGRESS NOTE   Diagnosis: CLL, hypogammaglobulinemia  INTERVAL HISTORY:   [name] returns as scheduled.  She continues monthly IVIG.  She reports no recent infection.  She had diarrhea yesterday. She now resides in an sister living facility.  She is scheduled to return home in a few weeks. She was admitted 12/16/2017 with a urinary tract infection.  She was admitted 12/21/2017 with a right rectus abdominis hematoma.  The hematoma was felt to be secondary to Lovenox therapy given during the previous hospital admission.  Objective:  Vital signs in last 24 hours:  Blood pressure 110/71, pulse 78, temperature 98.6 F (37 C), temperature source Oral, resp. rate 18, height 5\' 4"  (1.626 m), weight 131 lb 8 oz (59.6 kg), SpO2 94 %.    HEENT: Neck without mass Lymphatics: No cervical, supraclavicular, or axillary nodes Resp: Clear bilaterally Cardio: Regular rate and rhythm GI: No hepatosplenomegaly, soft, nontender Vascular: No leg edema   Lab Results:  Lab Results  Component Value Date   WBC 4.6 03/14/2018   HGB 14.3 03/14/2018   HCT 43.4 03/14/2018   MCV 93.5 03/14/2018   PLT 123 (L) 03/14/2018   NEUTROABS 2.5 03/14/2018    CMP  Lab Results  Component Value Date   NA 131 (L) 12/23/2017   K 4.0 12/23/2017   CL 96 (L) 12/23/2017   CO2 27 12/23/2017   GLUCOSE 92 12/23/2017   BUN 17 12/23/2017   CREATININE 0.50 12/23/2017   CALCIUM 8.7 (L) 12/23/2017   PROT 6.6 12/21/2017   ALBUMIN 3.4 (L) 12/21/2017   AST 28 12/21/2017   ALT 24 12/21/2017   ALKPHOS 39 12/21/2017   BILITOT 0.4 12/21/2017   GFRNONAA >60 12/23/2017   GFRAA >60 12/23/2017    No results found for: CEA1  Lab Results  Component Value Date   INR 1.22 12/21/2017    Imaging:  No results found.  Medications: I have reviewed the patient's current medications.   Assessment/Plan: 1. Chronic lymphocytic leukemia, asymptomatic. 2. History of  hypogammaglobulinemia with recurrent infections. She continues monthly IVIG replacement therapy. 3. History of recurrent urinary tract infections. 4. History of mild thrombocytopenia secondary to CLL. 5. Question right upper lobe nodule on chest x-ray 05/09/2016; follow-up chest x-ray 09/19/2016 with more confluent irregular nodular density right upper lobe measuring 3 x 1.8 cm. repeat chest x-ray 01/09/2017-regression of irregular nodular density in the right upper lobe felt to represent interval scarring. Stable on chest x-ray 05/01/2017                                     6.   Admission 12/21/2017 with a right rectus hematoma, felt to be related to Lovenox received during the 12/16/2017 hospital admission    Disposition: [name] appears stable from the CLL.  She will continue monthly IVIG.  She will be scheduled for an influenza vaccine next month.  She will meet with the social worker to discuss options for continued nursing facility placement.  [name] will return for office visit in 3 months.  15 minutes were spent with the patient today.  The majority of the time was used for counseling and coordination of care.  Betsy Coder, MD  03/14/2018  9:11 AM

## 2018-03-14 NOTE — Patient Instructions (Signed)

## 2018-03-14 NOTE — Progress Notes (Signed)
[place] CSW Progress Note  Request received from MD to speak w patient about how to remain at current assisted living placement. Was placed at [facility] SNF after being found unable to walk in her home several months ago; has progressed out of SNF and into ALF area of same facility. Spoke w patient in infusion room, patient is very pleased w current placement ([facility]).  Pt did not want CSW to talk w son re arrangements but did consent to CSW speaking directly w [facility].  Spoke w [place] [phone]) at facility - [facility] will have RN speak w patient about arrangements and how to remain as a resident at current facility.  As this is an assisted living placement, patient would not be using her [facility] Medicare for this placement cost.  Facility can address how to remain a resident of ALF w patient directly.  Edwyna Shell, LCSW Clinical Social Worker Phone:  [phone]

## 2018-03-15 ENCOUNTER — Telehealth: Payer: Self-pay | Admitting: Oncology

## 2018-03-15 NOTE — Telephone Encounter (Signed)
Scheduled appt per 8/22 los - sent reminder letter in the mail - spoke with son - he is aware of appt date and time.

## 2018-04-11 ENCOUNTER — Inpatient Hospital Stay: Payer: Medicare Other | Attending: Oncology

## 2018-04-11 ENCOUNTER — Inpatient Hospital Stay: Payer: Medicare Other

## 2018-04-11 VITALS — BP 130/69 | HR 96 | Temp 98.4°F | Resp 20

## 2018-04-11 DIAGNOSIS — Z79899 Other long term (current) drug therapy: Secondary | ICD-10-CM | POA: Insufficient documentation

## 2018-04-11 DIAGNOSIS — D801 Nonfamilial hypogammaglobulinemia: Secondary | ICD-10-CM | POA: Diagnosis not present

## 2018-04-11 DIAGNOSIS — C9110 Chronic lymphocytic leukemia of B-cell type not having achieved remission: Secondary | ICD-10-CM | POA: Insufficient documentation

## 2018-04-11 DIAGNOSIS — Z23 Encounter for immunization: Secondary | ICD-10-CM | POA: Insufficient documentation

## 2018-04-11 LAB — CBC WITH DIFFERENTIAL (CANCER CENTER ONLY)
Basophils Absolute: 0 10*3/uL (ref 0.0–0.1)
Basophils Relative: 1 %
EOS ABS: 0.1 10*3/uL (ref 0.0–0.5)
Eosinophils Relative: 1 %
HCT: 42.7 % (ref 34.8–46.6)
HEMOGLOBIN: 14 g/dL (ref 11.6–15.9)
Lymphocytes Relative: 32 %
Lymphs Abs: 1.5 10*3/uL (ref 0.9–3.3)
MCH: 30.4 pg (ref 25.1–34.0)
MCHC: 32.8 g/dL (ref 31.5–36.0)
MCV: 92.7 fL (ref 79.5–101.0)
Monocytes Absolute: 0.3 10*3/uL (ref 0.1–0.9)
Monocytes Relative: 6 %
NEUTROS PCT: 60 %
Neutro Abs: 2.8 10*3/uL (ref 1.5–6.5)
Platelet Count: 117 10*3/uL — ABNORMAL LOW (ref 145–400)
RBC: 4.61 MIL/uL (ref 3.70–5.45)
RDW: 15 % — ABNORMAL HIGH (ref 11.2–14.5)
WBC Count: 4.7 10*3/uL (ref 3.9–10.3)

## 2018-04-11 MED ORDER — DIPHENHYDRAMINE HCL 25 MG PO CAPS
ORAL_CAPSULE | ORAL | Status: AC
  Filled 2018-04-11: qty 1

## 2018-04-11 MED ORDER — IMMUNE GLOBULIN (HUMAN) 10 GM/100ML IV SOLN
30.0000 g | Freq: Once | INTRAVENOUS | Status: AC
  Administered 2018-04-11: 30 g via INTRAVENOUS
  Filled 2018-04-11: qty 300

## 2018-04-11 MED ORDER — DIPHENHYDRAMINE HCL 25 MG PO CAPS
25.0000 mg | ORAL_CAPSULE | Freq: Once | ORAL | Status: AC
  Administered 2018-04-11: 25 mg via ORAL

## 2018-04-11 MED ORDER — INFLUENZA VAC SPLIT QUAD 0.5 ML IM SUSY
PREFILLED_SYRINGE | INTRAMUSCULAR | Status: AC
  Filled 2018-04-11: qty 0.5

## 2018-04-11 MED ORDER — INFLUENZA VAC SPLIT QUAD 0.5 ML IM SUSY
0.5000 mL | PREFILLED_SYRINGE | Freq: Once | INTRAMUSCULAR | Status: AC
  Administered 2018-04-11: 0.5 mL via INTRAMUSCULAR

## 2018-05-09 ENCOUNTER — Inpatient Hospital Stay: Payer: Medicare Other | Attending: Oncology

## 2018-05-09 VITALS — BP 146/84 | HR 86 | Temp 98.1°F | Resp 14 | Wt 125.8 lb

## 2018-05-09 DIAGNOSIS — C9110 Chronic lymphocytic leukemia of B-cell type not having achieved remission: Secondary | ICD-10-CM

## 2018-05-09 DIAGNOSIS — Z79899 Other long term (current) drug therapy: Secondary | ICD-10-CM | POA: Insufficient documentation

## 2018-05-09 DIAGNOSIS — D801 Nonfamilial hypogammaglobulinemia: Secondary | ICD-10-CM | POA: Insufficient documentation

## 2018-05-09 MED ORDER — SODIUM CHLORIDE 0.9 % IV SOLN
750.0000 mL | Freq: Once | INTRAVENOUS | Status: AC
  Administered 2018-05-09: 750 mL via INTRAVENOUS
  Filled 2018-05-09: qty 750

## 2018-05-09 MED ORDER — DIPHENHYDRAMINE HCL 25 MG PO CAPS
25.0000 mg | ORAL_CAPSULE | Freq: Once | ORAL | Status: AC
  Administered 2018-05-09: 25 mg via ORAL

## 2018-05-09 MED ORDER — IMMUNE GLOBULIN (HUMAN) 10 GM/100ML IV SOLN
25.0000 g | Freq: Once | INTRAVENOUS | Status: AC
  Administered 2018-05-09: 25 g via INTRAVENOUS
  Filled 2018-05-09: qty 200

## 2018-05-09 MED ORDER — DIPHENHYDRAMINE HCL 25 MG PO CAPS
ORAL_CAPSULE | ORAL | Status: AC
  Filled 2018-05-09: qty 1

## 2018-05-09 NOTE — Patient Instructions (Signed)

## 2018-05-09 NOTE — Progress Notes (Signed)
Dr. Benay Spice gave ok to reduce IVIG dose based on recent weight loss. Kennith Center, Pharm.D., CPP 05/09/2018@10 :48 AM

## 2018-06-05 ENCOUNTER — Other Ambulatory Visit: Payer: Self-pay | Admitting: Emergency Medicine

## 2018-06-05 DIAGNOSIS — C9110 Chronic lymphocytic leukemia of B-cell type not having achieved remission: Secondary | ICD-10-CM

## 2018-06-06 ENCOUNTER — Inpatient Hospital Stay (HOSPITAL_BASED_OUTPATIENT_CLINIC_OR_DEPARTMENT_OTHER): Payer: Medicare Other | Admitting: Oncology

## 2018-06-06 ENCOUNTER — Telehealth: Payer: Self-pay | Admitting: Oncology

## 2018-06-06 ENCOUNTER — Inpatient Hospital Stay: Payer: Medicare Other

## 2018-06-06 ENCOUNTER — Inpatient Hospital Stay: Payer: Medicare Other | Attending: Oncology

## 2018-06-06 VITALS — BP 128/82 | HR 88 | Temp 97.6°F | Resp 17 | Ht 64.0 in | Wt 149.8 lb

## 2018-06-06 VITALS — BP 147/92 | HR 85 | Temp 97.8°F | Resp 18

## 2018-06-06 DIAGNOSIS — Z79899 Other long term (current) drug therapy: Secondary | ICD-10-CM | POA: Diagnosis not present

## 2018-06-06 DIAGNOSIS — C9110 Chronic lymphocytic leukemia of B-cell type not having achieved remission: Secondary | ICD-10-CM

## 2018-06-06 DIAGNOSIS — R918 Other nonspecific abnormal finding of lung field: Secondary | ICD-10-CM | POA: Diagnosis not present

## 2018-06-06 DIAGNOSIS — Z87440 Personal history of urinary (tract) infections: Secondary | ICD-10-CM | POA: Diagnosis not present

## 2018-06-06 DIAGNOSIS — D801 Nonfamilial hypogammaglobulinemia: Secondary | ICD-10-CM | POA: Insufficient documentation

## 2018-06-06 LAB — CBC WITH DIFFERENTIAL (CANCER CENTER ONLY)
Abs Immature Granulocytes: 0.01 10*3/uL (ref 0.00–0.07)
BASOS ABS: 0 10*3/uL (ref 0.0–0.1)
BASOS PCT: 1 %
EOS ABS: 0.1 10*3/uL (ref 0.0–0.5)
EOS PCT: 2 %
HCT: 42.1 % (ref 36.0–46.0)
HEMOGLOBIN: 13.7 g/dL (ref 12.0–15.0)
Immature Granulocytes: 0 %
Lymphocytes Relative: 37 %
Lymphs Abs: 1.3 10*3/uL (ref 0.7–4.0)
MCH: 31 pg (ref 26.0–34.0)
MCHC: 32.5 g/dL (ref 30.0–36.0)
MCV: 95.2 fL (ref 80.0–100.0)
MONO ABS: 0.3 10*3/uL (ref 0.1–1.0)
Monocytes Relative: 8 %
NRBC: 0 % (ref 0.0–0.2)
Neutro Abs: 1.9 10*3/uL (ref 1.7–7.7)
Neutrophils Relative %: 52 %
Platelet Count: 138 10*3/uL — ABNORMAL LOW (ref 150–400)
RBC: 4.42 MIL/uL (ref 3.87–5.11)
RDW: 15.2 % (ref 11.5–15.5)
WBC: 3.6 10*3/uL — AB (ref 4.0–10.5)

## 2018-06-06 MED ORDER — IMMUNE GLOBULIN (HUMAN) 10 GM/100ML IV SOLN
25.0000 g | Freq: Once | INTRAVENOUS | Status: AC
  Administered 2018-06-06: 25 g via INTRAVENOUS
  Filled 2018-06-06: qty 50

## 2018-06-06 MED ORDER — DIPHENHYDRAMINE HCL 25 MG PO CAPS
25.0000 mg | ORAL_CAPSULE | Freq: Once | ORAL | Status: AC
  Administered 2018-06-06: 25 mg via ORAL

## 2018-06-06 MED ORDER — DIPHENHYDRAMINE HCL 25 MG PO CAPS
ORAL_CAPSULE | ORAL | Status: AC
  Filled 2018-06-06: qty 1

## 2018-06-06 MED ORDER — DEXTROSE 5 % IV SOLN
INTRAVENOUS | Status: DC
  Administered 2018-06-06: 11:00:00 via INTRAVENOUS
  Filled 2018-06-06: qty 250

## 2018-06-06 NOTE — Telephone Encounter (Signed)
Gave pt avs and calendar  °

## 2018-06-06 NOTE — Progress Notes (Signed)
  [facility] OFFICE PROGRESS NOTE   Diagnosis: CLL, hypogammaglobulinemia  INTERVAL HISTORY:   [name] returns as scheduled.  She completed another treatment with IVIG on 05/09/2018.  She feels well.  No recent infection.  She has reflux symptoms.  She is now living in an assisted living facility.  Objective:  Vital signs in last 24 hours:  Blood pressure 128/82, pulse 88, temperature 97.6 F (36.4 C), temperature source Oral, resp. rate 17, height 5\' 4"  (1.626 m), weight 149 lb 12.8 oz (67.9 kg), SpO2 98 %.    HEENT: No thrush Lymphatics: No cervical or supraclavicular nodes Resp: Lungs clear bilaterally Cardio: Regular rate and rhythm GI: No mass, nontender, no hepatosplenomegaly Vascular: No leg edema  Lab Results:  Lab Results  Component Value Date   WBC 3.6 (L) 06/06/2018   HGB 13.7 06/06/2018   HCT 42.1 06/06/2018   MCV 95.2 06/06/2018   PLT 138 (L) 06/06/2018   NEUTROABS 1.9 06/06/2018   Medications: I have reviewed the patient's current medications.   Assessment/Plan: 1. Chronic lymphocytic leukemia, asymptomatic. 2. History of hypogammaglobulinemia with recurrent infections. She continues monthly IVIG replacement therapy. 3. History of recurrent urinary tract infections. 4. History of mild thrombocytopenia secondary to CLL. 5. Question right upper lobe nodule on chest x-ray 05/09/2016; follow-up chest x-ray 09/19/2016 with more confluent irregular nodular density right upper lobe measuring 3 x 1.8 cm. repeat chest x-ray 01/09/2017-regression of irregular nodular density in the right upper lobe felt to represent interval scarring. Stable on chest x-ray 05/01/2017                                     6.   Admission 12/21/2017 with a right rectus hematoma, felt to be related to Lovenox received during the 12/16/2017 hospital admission    Disposition: Ms. [name] appears stable.  She will continue monthly  IVIG.  We will check immunoglobulin levels when she returns next month.  She will return for an office visit in 4 months.  15 minutes were spent with the patient today.  The majority of the time was used for counseling and coordination of care.  Betsy Coder, MD  06/06/2018  9:53 AM

## 2018-07-09 ENCOUNTER — Inpatient Hospital Stay: Payer: Medicare Other | Attending: Oncology

## 2018-07-09 ENCOUNTER — Inpatient Hospital Stay: Payer: Medicare Other

## 2018-07-09 VITALS — BP 152/89 | HR 95 | Temp 98.5°F | Resp 20 | Wt 156.8 lb

## 2018-07-09 DIAGNOSIS — C9110 Chronic lymphocytic leukemia of B-cell type not having achieved remission: Secondary | ICD-10-CM

## 2018-07-09 DIAGNOSIS — D801 Nonfamilial hypogammaglobulinemia: Secondary | ICD-10-CM | POA: Diagnosis not present

## 2018-07-09 DIAGNOSIS — Z79899 Other long term (current) drug therapy: Secondary | ICD-10-CM | POA: Insufficient documentation

## 2018-07-09 LAB — CBC WITH DIFFERENTIAL (CANCER CENTER ONLY)
ABS IMMATURE GRANULOCYTES: 0.01 10*3/uL (ref 0.00–0.07)
BASOS PCT: 1 %
Basophils Absolute: 0 10*3/uL (ref 0.0–0.1)
Eosinophils Absolute: 0.2 10*3/uL (ref 0.0–0.5)
Eosinophils Relative: 2 %
HCT: 41 % (ref 36.0–46.0)
HEMOGLOBIN: 13.3 g/dL (ref 12.0–15.0)
IMMATURE GRANULOCYTES: 0 %
LYMPHS PCT: 27 %
Lymphs Abs: 1.7 10*3/uL (ref 0.7–4.0)
MCH: 31 pg (ref 26.0–34.0)
MCHC: 32.4 g/dL (ref 30.0–36.0)
MCV: 95.6 fL (ref 80.0–100.0)
MONOS PCT: 7 %
Monocytes Absolute: 0.5 10*3/uL (ref 0.1–1.0)
NEUTROS PCT: 63 %
Neutro Abs: 4.1 10*3/uL (ref 1.7–7.7)
PLATELETS: 142 10*3/uL — AB (ref 150–400)
RBC: 4.29 MIL/uL (ref 3.87–5.11)
RDW: 14.5 % (ref 11.5–15.5)
WBC Count: 6.4 10*3/uL (ref 4.0–10.5)
nRBC: 0 % (ref 0.0–0.2)

## 2018-07-09 MED ORDER — IMMUNE GLOBULIN (HUMAN) 10 GM/100ML IV SOLN
25.0000 g | Freq: Once | INTRAVENOUS | Status: DC
  Filled 2018-07-09: qty 250

## 2018-07-09 MED ORDER — IMMUNE GLOBULIN (HUMAN) 10 GM/100ML IV SOLN
30.0000 g | Freq: Once | INTRAVENOUS | Status: AC
  Administered 2018-07-09: 30 g via INTRAVENOUS
  Filled 2018-07-09: qty 100

## 2018-07-09 MED ORDER — DIPHENHYDRAMINE HCL 25 MG PO CAPS
25.0000 mg | ORAL_CAPSULE | Freq: Once | ORAL | Status: AC
  Administered 2018-07-09: 25 mg via ORAL

## 2018-07-09 MED ORDER — DEXTROSE 5 % IV SOLN
INTRAVENOUS | Status: DC
  Administered 2018-07-09: 10:00:00 via INTRAVENOUS
  Filled 2018-07-09: qty 250

## 2018-07-09 MED ORDER — DIPHENHYDRAMINE HCL 25 MG PO CAPS
ORAL_CAPSULE | ORAL | Status: AC
  Filled 2018-07-09: qty 1

## 2018-07-09 NOTE — Patient Instructions (Signed)

## 2018-07-10 LAB — IGG, IGA, IGM
IGG (IMMUNOGLOBIN G), SERUM: 1021 mg/dL (ref 700–1600)
IgA: 256 mg/dL (ref 64–422)
IgM (Immunoglobulin M), Srm: 42 mg/dL (ref 26–217)

## 2018-08-06 ENCOUNTER — Inpatient Hospital Stay: Payer: Medicare Other

## 2018-08-06 ENCOUNTER — Telehealth: Payer: Self-pay | Admitting: Oncology

## 2018-08-06 ENCOUNTER — Inpatient Hospital Stay: Payer: Medicare Other | Attending: Oncology

## 2018-08-06 VITALS — BP 150/93 | HR 99 | Temp 98.7°F | Resp 16

## 2018-08-06 DIAGNOSIS — Z79899 Other long term (current) drug therapy: Secondary | ICD-10-CM | POA: Diagnosis not present

## 2018-08-06 DIAGNOSIS — C9110 Chronic lymphocytic leukemia of B-cell type not having achieved remission: Secondary | ICD-10-CM

## 2018-08-06 DIAGNOSIS — D801 Nonfamilial hypogammaglobulinemia: Secondary | ICD-10-CM | POA: Insufficient documentation

## 2018-08-06 MED ORDER — IMMUNE GLOBULIN (HUMAN) 10 GM/100ML IV SOLN
30.0000 g | Freq: Once | INTRAVENOUS | Status: AC
  Administered 2018-08-06: 30 g via INTRAVENOUS
  Filled 2018-08-06: qty 200

## 2018-08-06 MED ORDER — DIPHENHYDRAMINE HCL 25 MG PO CAPS
25.0000 mg | ORAL_CAPSULE | Freq: Once | ORAL | Status: AC
  Administered 2018-08-06: 25 mg via ORAL

## 2018-08-06 MED ORDER — DIPHENHYDRAMINE HCL 25 MG PO CAPS
ORAL_CAPSULE | ORAL | Status: AC
  Filled 2018-08-06: qty 1

## 2018-08-06 NOTE — Patient Instructions (Signed)

## 2018-08-06 NOTE — Telephone Encounter (Signed)
Patient in to reschedule lab and treatment appt. She wanted her appt to be on a Thursday instead of that Tuesday.

## 2018-09-03 ENCOUNTER — Ambulatory Visit: Payer: Medicare Other

## 2018-09-03 ENCOUNTER — Other Ambulatory Visit: Payer: Medicare Other

## 2018-09-05 ENCOUNTER — Inpatient Hospital Stay: Payer: Medicare Other | Attending: Oncology

## 2018-09-05 ENCOUNTER — Inpatient Hospital Stay: Payer: Medicare Other

## 2018-09-05 VITALS — BP 134/80 | HR 84 | Temp 98.2°F | Resp 16

## 2018-09-05 DIAGNOSIS — Z79899 Other long term (current) drug therapy: Secondary | ICD-10-CM | POA: Diagnosis not present

## 2018-09-05 DIAGNOSIS — D801 Nonfamilial hypogammaglobulinemia: Secondary | ICD-10-CM | POA: Insufficient documentation

## 2018-09-05 DIAGNOSIS — C9110 Chronic lymphocytic leukemia of B-cell type not having achieved remission: Secondary | ICD-10-CM

## 2018-09-05 MED ORDER — DEXTROSE 5 % IV SOLN
Freq: Once | INTRAVENOUS | Status: AC
  Administered 2018-09-05: 10:00:00 via INTRAVENOUS
  Filled 2018-09-05: qty 250

## 2018-09-05 MED ORDER — IMMUNE GLOBULIN (HUMAN) 10 GM/100ML IV SOLN
30.0000 g | Freq: Once | INTRAVENOUS | Status: AC
  Administered 2018-09-05: 30 g via INTRAVENOUS
  Filled 2018-09-05: qty 300

## 2018-09-05 MED ORDER — DIPHENHYDRAMINE HCL 25 MG PO CAPS
ORAL_CAPSULE | ORAL | Status: AC
  Filled 2018-09-05: qty 1

## 2018-09-05 MED ORDER — DIPHENHYDRAMINE HCL 25 MG PO CAPS
25.0000 mg | ORAL_CAPSULE | Freq: Once | ORAL | Status: AC
  Administered 2018-09-05: 25 mg via ORAL

## 2018-09-05 NOTE — Patient Instructions (Signed)

## 2018-09-26 ENCOUNTER — Telehealth: Payer: Self-pay | Admitting: Internal Medicine

## 2018-09-26 NOTE — Telephone Encounter (Signed)
Insurance has been submitted and verified for Prolia. Patient is responsible for a $0 copay but has a $200 deductible. She does not have to pay this at the time but patient pay be subject to the deductible if it has not already been met. Due anytime. Left message for patient to call back to schedule.  Okay to schedule... Visit Note: Prolia ($0 copay - okay to give per Gareth Eagle) Visit Type: Nurse Provider: Nurse

## 2018-09-30 ENCOUNTER — Other Ambulatory Visit: Payer: Self-pay | Admitting: *Deleted

## 2018-09-30 DIAGNOSIS — C9110 Chronic lymphocytic leukemia of B-cell type not having achieved remission: Secondary | ICD-10-CM

## 2018-10-01 ENCOUNTER — Inpatient Hospital Stay (HOSPITAL_BASED_OUTPATIENT_CLINIC_OR_DEPARTMENT_OTHER): Payer: Medicare Other | Admitting: Nurse Practitioner

## 2018-10-01 ENCOUNTER — Inpatient Hospital Stay: Payer: Medicare Other | Attending: Oncology

## 2018-10-01 ENCOUNTER — Encounter: Payer: Self-pay | Admitting: Nurse Practitioner

## 2018-10-01 ENCOUNTER — Inpatient Hospital Stay: Payer: Medicare Other

## 2018-10-01 ENCOUNTER — Other Ambulatory Visit: Payer: Self-pay

## 2018-10-01 VITALS — BP 137/87 | HR 84 | Temp 98.4°F | Resp 17

## 2018-10-01 VITALS — BP 145/77 | HR 86 | Temp 98.2°F | Resp 16 | Ht 64.0 in | Wt 159.4 lb

## 2018-10-01 DIAGNOSIS — L988 Other specified disorders of the skin and subcutaneous tissue: Secondary | ICD-10-CM | POA: Insufficient documentation

## 2018-10-01 DIAGNOSIS — D801 Nonfamilial hypogammaglobulinemia: Secondary | ICD-10-CM | POA: Insufficient documentation

## 2018-10-01 DIAGNOSIS — Z87440 Personal history of urinary (tract) infections: Secondary | ICD-10-CM

## 2018-10-01 DIAGNOSIS — N390 Urinary tract infection, site not specified: Secondary | ICD-10-CM

## 2018-10-01 DIAGNOSIS — R918 Other nonspecific abnormal finding of lung field: Secondary | ICD-10-CM

## 2018-10-01 DIAGNOSIS — C9110 Chronic lymphocytic leukemia of B-cell type not having achieved remission: Secondary | ICD-10-CM | POA: Diagnosis not present

## 2018-10-01 DIAGNOSIS — D696 Thrombocytopenia, unspecified: Secondary | ICD-10-CM

## 2018-10-01 LAB — CBC WITH DIFFERENTIAL (CANCER CENTER ONLY)
Abs Immature Granulocytes: 0.01 10*3/uL (ref 0.00–0.07)
BASOS ABS: 0 10*3/uL (ref 0.0–0.1)
Basophils Relative: 1 %
Eosinophils Absolute: 0.1 10*3/uL (ref 0.0–0.5)
Eosinophils Relative: 2 %
HEMATOCRIT: 41.2 % (ref 36.0–46.0)
HEMOGLOBIN: 13.3 g/dL (ref 12.0–15.0)
Immature Granulocytes: 0 %
LYMPHS PCT: 33 %
Lymphs Abs: 2 10*3/uL (ref 0.7–4.0)
MCH: 30.6 pg (ref 26.0–34.0)
MCHC: 32.3 g/dL (ref 30.0–36.0)
MCV: 94.9 fL (ref 80.0–100.0)
Monocytes Absolute: 0.5 10*3/uL (ref 0.1–1.0)
Monocytes Relative: 8 %
Neutro Abs: 3.4 10*3/uL (ref 1.7–7.7)
Neutrophils Relative %: 56 %
Platelet Count: 141 10*3/uL — ABNORMAL LOW (ref 150–400)
RBC: 4.34 MIL/uL (ref 3.87–5.11)
RDW: 14.1 % (ref 11.5–15.5)
WBC: 5.9 10*3/uL (ref 4.0–10.5)
nRBC: 0 % (ref 0.0–0.2)

## 2018-10-01 LAB — URINALYSIS, COMPLETE (UACMP) WITH MICROSCOPIC
Bilirubin Urine: NEGATIVE
Glucose, UA: NEGATIVE mg/dL
Ketones, ur: NEGATIVE mg/dL
Nitrite: NEGATIVE
Protein, ur: 30 mg/dL — AB
Specific Gravity, Urine: 1.017 (ref 1.005–1.030)
pH: 7 (ref 5.0–8.0)

## 2018-10-01 MED ORDER — DIPHENHYDRAMINE HCL 25 MG PO CAPS
25.0000 mg | ORAL_CAPSULE | Freq: Once | ORAL | Status: AC
  Administered 2018-10-01: 25 mg via ORAL

## 2018-10-01 MED ORDER — IMMUNE GLOBULIN (HUMAN) 10 GM/100ML IV SOLN
30.0000 g | Freq: Once | INTRAVENOUS | Status: AC
  Administered 2018-10-01: 30 g via INTRAVENOUS
  Filled 2018-10-01: qty 300

## 2018-10-01 MED ORDER — DIPHENHYDRAMINE HCL 25 MG PO CAPS
ORAL_CAPSULE | ORAL | Status: AC
  Filled 2018-10-01: qty 1

## 2018-10-01 MED ORDER — DEXTROSE 5 % IV SOLN
INTRAVENOUS | Status: DC
  Administered 2018-10-01: 10:00:00 via INTRAVENOUS
  Filled 2018-10-01: qty 250

## 2018-10-01 NOTE — Patient Instructions (Signed)

## 2018-10-01 NOTE — Progress Notes (Signed)
  [facility] OFFICE PROGRESS NOTE   Diagnosis: CLL, hypogammaglobulinemia  INTERVAL HISTORY:   [name] returns as scheduled.  She continues monthly IVIG.  No interim illnesses or infections.  No fevers or sweats.  She has a good appetite.  She is gaining weight.  She denies pain.  No nausea or vomiting.  No constipation or diarrhea.  No bleeding.  She is concerned she has a urinary tract infection due to frequency.  No dysuria.  Objective:  Vital signs in last 24 hours:  Blood pressure (!) 145/77, pulse 86, temperature 98.2 F (36.8 C), temperature source Oral, resp. rate 16, height 5\' 4"  (1.626 m), weight 159 lb 6.4 oz (72.3 kg), SpO2 95 %.    HEENT: No thrush or ulcers. Lymphatics: No palpable cervical, supraclavicular or axillary lymph nodes. Resp: Lungs clear bilaterally. Cardio: Regular rate and rhythm. GI: Abdomen soft and nontender.  No hepatosplenomegaly. Vascular: No leg edema.  Calves soft and nontender.  Skin: Approximate 3 mm mildly hyperpigmented raised skin lesion with superficial ulceration right face.   Lab Results:  Lab Results  Component Value Date   WBC 5.9 10/01/2018   HGB 13.3 10/01/2018   HCT 41.2 10/01/2018   MCV 94.9 10/01/2018   PLT 141 (L) 10/01/2018   NEUTROABS 3.4 10/01/2018    Imaging:  No results found.  Medications: I have reviewed the patient's current medications.  Assessment/Plan: 1. Chronic lymphocytic leukemia, asymptomatic. 2. History of hypogammaglobulinemia with recurrent infections. She continues monthly IVIG replacement therapy. 3. History of recurrent urinary tract infections. 4. History of mild thrombocytopenia secondary to CLL. 5. Question right upper lobe nodule on chest x-ray 05/09/2016; follow-up chest x-ray 09/19/2016 with more confluent irregular nodular density right upper lobe measuring 3 x 1.8 cm. repeat chest x-ray 01/09/2017-regression of irregular nodular  density in the right upper lobe felt to represent interval scarring. Stable on chest x-ray 05/01/2017 6.Admission 12/21/2017 with a right rectus hematoma, felt to be related to Lovenox received during the 12/16/2017 hospital admission  Disposition: [name] appears unchanged.  The plan is to continue monthly IVIG.  At her request the interval will at times be 5 weeks rather than 4.  We reviewed the CBC from today.  Counts remain stable.  She is having urinary frequency.  Urinalysis is pending.  Referral made to dermatology to evaluate the right facial lesion.  She will return for an office visit in 4 months.  She will contact the office in the interim with any problems.    Ned Card ANP/GNP-BC   10/01/2018  9:49 AM

## 2018-10-05 LAB — URINE CULTURE: Culture: >=100000 — AB

## 2018-10-14 ENCOUNTER — Encounter: Payer: Self-pay | Admitting: Internal Medicine

## 2018-10-25 ENCOUNTER — Telehealth: Payer: Self-pay | Admitting: *Deleted

## 2018-10-25 NOTE — Telephone Encounter (Signed)
"  [facility] [phone]).  [name] is scheduled to come in for CLL treatment next Tuesday.  For her safety, other residents and staff, is this medically necessary?  None of our residents have left the facility for several weeks."

## 2018-10-25 NOTE — Telephone Encounter (Signed)
Calling to inquire if IVIG treatment in April is necessary? Weigh risk/benefit of treatment. Entire facility is in lock down due to [place]. Per Dr. Benay Spice: OK to skip her April IVIG. Left his response on her voice mail after being on hold about 5 minutes.

## 2018-11-05 ENCOUNTER — Ambulatory Visit: Payer: Medicare Other

## 2018-11-25 ENCOUNTER — Inpatient Hospital Stay (HOSPITAL_COMMUNITY)
Admission: EM | Admit: 2018-11-25 | Discharge: 2018-11-27 | DRG: 641 | Disposition: A | Discharge status: Skilled Nursing Facility | Payer: Medicare Other | Attending: Family Medicine | Admitting: Family Medicine

## 2018-11-25 ENCOUNTER — Encounter (HOSPITAL_COMMUNITY): Payer: Self-pay

## 2018-11-25 ENCOUNTER — Other Ambulatory Visit: Payer: Self-pay

## 2018-11-25 DIAGNOSIS — E860 Dehydration: Principal | ICD-10-CM | POA: Diagnosis present

## 2018-11-25 DIAGNOSIS — E538 Deficiency of other specified B group vitamins: Secondary | ICD-10-CM | POA: Diagnosis present

## 2018-11-25 DIAGNOSIS — C9110 Chronic lymphocytic leukemia of B-cell type not having achieved remission: Secondary | ICD-10-CM | POA: Diagnosis present

## 2018-11-25 DIAGNOSIS — Z881 Allergy status to other antibiotic agents status: Secondary | ICD-10-CM

## 2018-11-25 DIAGNOSIS — Z886 Allergy status to analgesic agent status: Secondary | ICD-10-CM

## 2018-11-25 DIAGNOSIS — F0390 Unspecified dementia without behavioral disturbance: Secondary | ICD-10-CM | POA: Diagnosis present

## 2018-11-25 DIAGNOSIS — Z7951 Long term (current) use of inhaled steroids: Secondary | ICD-10-CM

## 2018-11-25 DIAGNOSIS — K219 Gastro-esophageal reflux disease without esophagitis: Secondary | ICD-10-CM | POA: Diagnosis not present

## 2018-11-25 DIAGNOSIS — N179 Acute kidney failure, unspecified: Secondary | ICD-10-CM | POA: Diagnosis present

## 2018-11-25 DIAGNOSIS — Z888 Allergy status to other drugs, medicaments and biological substances status: Secondary | ICD-10-CM

## 2018-11-25 DIAGNOSIS — N390 Urinary tract infection, site not specified: Secondary | ICD-10-CM | POA: Diagnosis present

## 2018-11-25 DIAGNOSIS — I10 Essential (primary) hypertension: Secondary | ICD-10-CM | POA: Diagnosis not present

## 2018-11-25 DIAGNOSIS — I959 Hypotension, unspecified: Secondary | ICD-10-CM | POA: Diagnosis present

## 2018-11-25 DIAGNOSIS — R55 Syncope and collapse: Secondary | ICD-10-CM | POA: Diagnosis not present

## 2018-11-25 DIAGNOSIS — Z66 Do not resuscitate: Secondary | ICD-10-CM | POA: Diagnosis present

## 2018-11-25 DIAGNOSIS — G609 Hereditary and idiopathic neuropathy, unspecified: Secondary | ICD-10-CM

## 2018-11-25 DIAGNOSIS — Z887 Allergy status to serum and vaccine status: Secondary | ICD-10-CM

## 2018-11-25 DIAGNOSIS — E785 Hyperlipidemia, unspecified: Secondary | ICD-10-CM | POA: Diagnosis present

## 2018-11-25 DIAGNOSIS — H409 Unspecified glaucoma: Secondary | ICD-10-CM | POA: Diagnosis present

## 2018-11-25 DIAGNOSIS — Z8249 Family history of ischemic heart disease and other diseases of the circulatory system: Secondary | ICD-10-CM

## 2018-11-25 DIAGNOSIS — Z90710 Acquired absence of both cervix and uterus: Secondary | ICD-10-CM

## 2018-11-25 DIAGNOSIS — Z1159 Encounter for screening for other viral diseases: Secondary | ICD-10-CM

## 2018-11-25 DIAGNOSIS — G629 Polyneuropathy, unspecified: Secondary | ICD-10-CM

## 2018-11-25 DIAGNOSIS — Z87440 Personal history of urinary (tract) infections: Secondary | ICD-10-CM

## 2018-11-25 DIAGNOSIS — Z885 Allergy status to narcotic agent status: Secondary | ICD-10-CM

## 2018-11-25 DIAGNOSIS — Z79899 Other long term (current) drug therapy: Secondary | ICD-10-CM

## 2018-11-25 DIAGNOSIS — D801 Nonfamilial hypogammaglobulinemia: Secondary | ICD-10-CM | POA: Diagnosis present

## 2018-11-25 LAB — BASIC METABOLIC PANEL
Anion gap: 9 (ref 5–15)
BUN: 21 mg/dL (ref 8–23)
CO2: 26 mmol/L (ref 22–32)
Calcium: 9.2 mg/dL (ref 8.9–10.3)
Chloride: 104 mmol/L (ref 98–111)
Creatinine, Ser: 1.08 mg/dL — ABNORMAL HIGH (ref 0.44–1.00)
GFR calc Af Amer: 52 mL/min — ABNORMAL LOW (ref >60)
GFR calc non Af Amer: 45 mL/min — ABNORMAL LOW (ref >60)
Glucose, Bld: 111 mg/dL — ABNORMAL HIGH (ref 70–99)
Potassium: 3.7 mmol/L (ref 3.5–5.1)
Sodium: 139 mmol/L (ref 135–145)

## 2018-11-25 LAB — TSH: TSH: 3.796 u[IU]/mL (ref 0.350–4.500)

## 2018-11-25 LAB — CBC WITH DIFFERENTIAL/PLATELET
Abs Immature Granulocytes: 0.02 10*3/uL (ref 0.00–0.07)
Basophils Absolute: 0 10*3/uL (ref 0.0–0.1)
Basophils Relative: 1 %
Eosinophils Absolute: 0.1 10*3/uL (ref 0.0–0.5)
Eosinophils Relative: 2 %
HCT: 43.3 % (ref 36.0–46.0)
Hemoglobin: 13.9 g/dL (ref 12.0–15.0)
Immature Granulocytes: 0 %
Lymphocytes Relative: 23 %
Lymphs Abs: 1.5 10*3/uL (ref 0.7–4.0)
MCH: 30.6 pg (ref 26.0–34.0)
MCHC: 32.1 g/dL (ref 30.0–36.0)
MCV: 95.4 fL (ref 80.0–100.0)
Monocytes Absolute: 0.4 10*3/uL (ref 0.1–1.0)
Monocytes Relative: 7 %
Neutro Abs: 4.4 10*3/uL (ref 1.7–7.7)
Neutrophils Relative %: 67 %
Platelets: 147 10*3/uL — ABNORMAL LOW (ref 150–400)
RBC: 4.54 MIL/uL (ref 3.87–5.11)
RDW: 14.4 % (ref 11.5–15.5)
WBC: 6.5 10*3/uL (ref 4.0–10.5)
nRBC: 0 % (ref 0.0–0.2)

## 2018-11-25 LAB — SARS CORONAVIRUS 2 BY RT PCR (HOSPITAL ORDER, PERFORMED IN ~~LOC~~ HOSPITAL LAB): SARS Coronavirus 2: NEGATIVE

## 2018-11-25 LAB — GLUCOSE, CAPILLARY: Glucose-Capillary: 133 mg/dL — ABNORMAL HIGH (ref 70–99)

## 2018-11-25 LAB — CBG MONITORING, ED
Glucose-Capillary: 99 mg/dL (ref 70–99)
Glucose-Capillary: 119 mg/dL — ABNORMAL HIGH (ref 70–99)

## 2018-11-25 MED ORDER — ACETAMINOPHEN 325 MG PO TABS
650.0000 mg | ORAL_TABLET | Freq: Four times a day (QID) | ORAL | Status: DC | PRN
  Administered 2018-11-27: 12:00:00 650 mg via ORAL
  Filled 2018-11-25: qty 2

## 2018-11-25 MED ORDER — SODIUM CHLORIDE 0.9 % IV SOLN
INTRAVENOUS | Status: DC
  Administered 2018-11-25 – 2018-11-26 (×2): via INTRAVENOUS

## 2018-11-25 MED ORDER — ACETAMINOPHEN 650 MG RE SUPP: 650.0000 mg | Freq: Four times a day (QID) | RECTAL | Status: DC | PRN

## 2018-11-25 MED ORDER — SENNOSIDES-DOCUSATE SODIUM 8.6-50 MG PO TABS: 1.0000 | ORAL_TABLET | Freq: Every evening | ORAL | Status: DC | PRN

## 2018-11-25 MED ORDER — DONEPEZIL HCL 10 MG PO TABS
10.0000 mg | ORAL_TABLET | Freq: Every day | ORAL | Status: DC
  Administered 2018-11-26: 10 mg via ORAL
  Filled 2018-11-25: qty 1

## 2018-11-25 MED ORDER — PANTOPRAZOLE SODIUM 40 MG PO TBEC
40.0000 mg | DELAYED_RELEASE_TABLET | Freq: Every day | ORAL | Status: DC
  Administered 2018-11-26 – 2018-11-27 (×2): 40 mg via ORAL
  Filled 2018-11-25 (×2): qty 1

## 2018-11-25 MED ORDER — BISACODYL 5 MG PO TBEC: 5.0000 mg | DELAYED_RELEASE_TABLET | Freq: Every day | ORAL | Status: DC | PRN

## 2018-11-25 MED ORDER — INSULIN ASPART 100 UNIT/ML ~~LOC~~ SOLN: 0.0000 [IU] | Freq: Three times a day (TID) | SUBCUTANEOUS | Status: DC

## 2018-11-25 MED ORDER — MEMANTINE HCL ER 28 MG PO CP24
28.0000 mg | ORAL_CAPSULE | Freq: Every day | ORAL | Status: DC
  Administered 2018-11-26: 28 mg via ORAL
  Filled 2018-11-25: qty 1

## 2018-11-25 MED ORDER — MEMANTINE HCL-DONEPEZIL HCL ER 28-10 MG PO CP24: 1.0000 | ORAL_CAPSULE | Freq: Every day | ORAL | Status: DC

## 2018-11-25 MED ORDER — FOLIC ACID 1 MG PO TABS
1.0000 mg | ORAL_TABLET | Freq: Every day | ORAL | Status: DC
  Administered 2018-11-26 – 2018-11-27 (×2): 1 mg via ORAL
  Filled 2018-11-25 (×2): qty 1

## 2018-11-25 MED ORDER — MIRABEGRON ER 25 MG PO TB24
50.0000 mg | ORAL_TABLET | Freq: Every day | ORAL | Status: DC
  Administered 2018-11-26 – 2018-11-27 (×2): 50 mg via ORAL
  Filled 2018-11-25 (×2): qty 2

## 2018-11-25 MED ORDER — TRAZODONE HCL 50 MG PO TABS
25.0000 mg | ORAL_TABLET | Freq: Every day | ORAL | Status: DC
  Administered 2018-11-25 – 2018-11-26 (×2): 25 mg via ORAL
  Filled 2018-11-25 (×2): qty 1

## 2018-11-25 MED ORDER — ENOXAPARIN SODIUM 40 MG/0.4ML ~~LOC~~ SOLN
40.0000 mg | SUBCUTANEOUS | Status: DC
  Administered 2018-11-25 – 2018-11-26 (×2): 40 mg via SUBCUTANEOUS
  Filled 2018-11-25 (×2): qty 0.4

## 2018-11-25 MED ORDER — OXYCODONE HCL 5 MG PO TABS: 5.0000 mg | ORAL_TABLET | ORAL | Status: DC | PRN

## 2018-11-25 NOTE — ED Provider Notes (Signed)
[name] DEPT Provider Note   CSN: [id number] Arrival date & time: 11/25/18  1224    History   Chief Complaint Chief Complaint  Patient presents with  . Loss of Consciousness    HPI [name] is a 85 y.o. female.     HPI  85 year old with medical history significant for CLL, hypertension, peripheral neuropathy and GERD presents to the ER with chief complaint of loss of consciousness.  Patient is unable to provide significantly relevant medical history surrounding her syncopal episode.  She reports that she was playing bingo, the next and she knows she was being helped by other people.  She was informed that she had 2 episodes of syncope.  According to the nursing staff at [facility], "patient was sitting in a chair, became pale, and nodded back in the chair when she lost consciousness for approx 30 seconds to a minute. Staff denies head injury. Patient blood sugar for facility was 121."  Patient denies any chest pain, palpitations, shortness of breath, and nausea, diaphoresis prior to the syncopal episode.  Past Medical History:  Diagnosis Date  . Allergy    rhinitis  . Chronic ethmoidal sinusitis   . Diverticulosis   . Diverticulosis of colon   . GERD (gastroesophageal reflux disease)   . Glaucoma   . Hx of colonic polyps   . Hyperlipidemia   . Hypertension   . Hypogammaglobulinemia ([place])    Monthly IVIG  . Leukemia ([place])    Chronic lymphocytic leukemia  . Osteoarthritis   . Peripheral neuropathy   . Pernicious anemia   . UTI (lower urinary tract infection) 2010  . Vitamin B12 deficiency     Patient Active Problem List   Diagnosis Date Noted  . Rectus sheath hematoma 12/21/2017  . Altered mental status 12/16/2017  . Diarrhea 09/19/2017  . Constipation 09/12/2017  . Alzheimer disease ([place]) 07/25/2017  . Neck pain on left side 07/25/2017  . Urinary tract infection, recurrent 06/12/2017  . Thrush 02/10/2017  .  Frequent urination 02/10/2017  . Osteoporosis 11/23/2016  . Abdominal pain 08/16/2016  . Urinary frequency 08/16/2016  . Peripheral edema 08/16/2016  . General weakness 08/16/2016  . Gait disorder 08/16/2016  . Dysuria 08/03/2016  . Closed displaced simple supracondylar fracture of right humerus without intercondylar fracture 05/10/2016  . Urinary tract infection without hematuria 05/10/2016  . Syncope 05/10/2016  . Humeral head fracture, right, closed, initial encounter   . Burning tongue 03/25/2016  . Post concussion syndrome 03/03/2016  . Fall 03/03/2016  . Cystocele with prolapse 03/05/2015  . Dehydration 02/08/2015  . Hyponatremia 02/08/2015  . Well adult exam 10/01/2014  . Grief at loss of child 12/11/2013  . Irregular heart beat 09/29/2013  . Humeral head fracture 08/26/2013  . Actinic keratoses 08/26/2013  . Carotid bruit 05/02/2012  . Anxiety 03/17/2011  . ANEMIA, PERNICIOUS, HX OF 10/10/2007  . Peripheral neuropathy ([place]) 09/04/2007  . Chronic fatigue 08/06/2007  . Chronic lymphocytic leukemia ([place]) 06/17/2007  . Allergic rhinitis 06/17/2007  . OSTEOARTHRITIS 06/17/2007  . B12 deficiency 06/07/2007  . GERD (gastroesophageal reflux disease) 04/30/2007  . Dyslipidemia 12/19/2006  . Essential hypertension 12/19/2006  . DIVERTICULOSIS, COLON 05/19/2003  . COLONIC POLYPS, HX OF 01/10/2000    Past Surgical History:  Procedure Laterality Date  . ABDOMINAL HYSTERECTOMY  1987  . bilateral cataracts  2007  . bladder tack  1987  . L4-L5 Laminetomy  1999  . TUBAL LIGATION    .  tubaligation  1961  . ureter reconstruction  1992     OB History    Gravida  4   Para  4   Term      Preterm      AB      Living  4     SAB      TAB      Ectopic      Multiple      Live Births               Home Medications    Prior to Admission medications   Medication Sig Start Date End Date Taking? Authorizing Provider  acetaminophen (TYLENOL) 500 MG tablet  Take 500 mg by mouth 2 (two) times daily.    Yes [provider]  alum & mag hydroxide-simeth (MI-ACID) 200-200-20 MG/5ML suspension Take 30 mLs by mouth every 4 (four) hours as needed for indigestion or heartburn.   Yes [provider]  calcium carbonate (ANTACID) 500 MG chewable tablet Chew 2 tablets by mouth every 4 (four) hours as needed for indigestion or heartburn.   Yes [provider]  Cholecalciferol (VITAMIN D3) 50 MCG (2000 UT) TABS Take 50 mcg by mouth daily.   Yes [provider]  cromolyn (OPTICROM) 4 % ophthalmic solution Place 1 drop into both eyes at bedtime.  03/21/16  Yes [provider]  fluticasone (FLONASE) 50 MCG/ACT nasal spray Place 2 sprays into both nostrils daily. 11/21/16  Yes Plotnikov, Evie Lacks, MD  folic acid (FOLVITE) 1 MG tablet Take 1 mg by mouth daily. For restless legs   Yes [provider]  lansoprazole (PREVACID) 30 MG capsule Take 30 mg by mouth daily at 12 noon.   Yes [provider]  latanoprost (XALATAN) 0.005 % ophthalmic solution PLACE 1 DROP INTO BOTH EYES AT BEDTIME. 12/31/17  Yes Plotnikov, Evie Lacks, MD  loratadine (CLARITIN) 10 MG tablet TAKE 1 TABLET (10 MG TOTAL) BY MOUTH DAILY. Patient taking differently: Take 10 mg by mouth daily.  11/19/17  Yes Plotnikov, Evie Lacks, MD  Memantine HCl-Donepezil HCl 28-10 MG CP24 Take 1 capsule by mouth daily. 12/13/17  Yes Plotnikov, Evie Lacks, MD  mirabegron ER (MYRBETRIQ) 50 MG TB24 tablet Take 1 tablet (50 mg total) by mouth daily. 11/20/17  Yes Plotnikov, Evie Lacks, MD  Multiple Vitamins-Minerals (CERTAVITE/ANTIOXIDANTS) TABS Take 1 tablet by mouth daily.   Yes [provider]  phenol (CHLORASEPTIC) 1.4 % LIQD Use as directed 5 sprays in the mouth or throat every 6 (six) hours as needed for throat irritation / pain.   Yes [provider]  polyethylene glycol (MIRALAX / GLYCOLAX) packet Take 17 g by mouth daily. Patient taking  differently: Take 17 g by mouth daily as needed for mild constipation.  12/21/17  Yes Mikhail, Maryann, DO  potassium chloride (MICRO-K) 10 MEQ CR capsule Take 10 mEq by mouth daily. 09/06/18  Yes [provider]  PRESCRIPTION MEDICATION Take 1 Dose by mouth 3 (three) times daily as needed (mouth pain). [facility] Magic Mouthwash: Swish, hold, & spit   Yes [provider]  Probiotic Product (PROBIOTIC PO) Take 1 capsule by mouth daily.   Yes [provider]  senna-docusate (SENOKOT-S) 8.6-50 MG tablet Take 2 tablets by mouth 2 (two) times daily. 12/23/17  Yes Patrecia Pour, Christean Grief, MD  traZODone (DESYREL) 50 MG tablet Take 25 mg by mouth at bedtime. 09/11/18  Yes [provider]  B-D INS SYRINGE 0.5CC/30GX1/2" 30G X 1/2"  0.5 ML MISC USE TO ADMINISTER B12 INJECTIONS 06/13/17   Plotnikov, Evie Lacks, MD  Cholecalciferol (VITAMIN D3) 2000 units capsule Take 1 capsule (2,000 Units total) by mouth daily. Patient not taking: Reported on 11/25/2018 11/23/16   Plotnikov, Evie Lacks, MD  cyanocobalamin (,VITAMIN B-12,) 1000 MCG/ML injection ADMINISTER 1 CC SUBQ EVERY 4 WEEKS AS DIRECTED Patient taking differently: Inject 1,000 mcg into the muscle every 30 (thirty) days.  08/07/16   Plotnikov, Evie Lacks, MD  donepezil (ARICEPT) 5 MG tablet TAKE 1 TABLET BY MOUTH EVERYDAY AT BEDTIME Patient not taking: Reported on 11/25/2018 12/27/17   Plotnikov, Evie Lacks, MD  feeding supplement, ENSURE ENLIVE, (ENSURE ENLIVE) LIQD Take 237 mLs by mouth 3 (three) times daily between meals. Patient not taking: Reported on 11/25/2018 12/20/17   Cristal Ford, DO  Insulin Syringe-Needle U-100 (BD INSULIN SYRINGE U/F) 30G X 1/2" 0.5 ML MISC USE TO ADMINISTER B12 INJECTIONS 06/13/17   [provider]  KLOR-CON M10 10 MEQ tablet TAKE 1 TABLET (10 MEQ TOTAL) BY MOUTH DAILY. Patient not taking: Reported on 11/25/2018 11/05/17   Plotnikov, Evie Lacks, MD  sucralfate (CARAFATE) 1 g tablet TAKE 1 TABLET (1 G TOTAL) BY  MOUTH 4 (FOUR) TIMES DAILY - WITH MEALS AND AT BEDTIME. Patient not taking: Reported on 11/25/2018 01/28/18   Plotnikov, Evie Lacks, MD  triamcinolone ointment (KENALOG) 0.1 % Apply 1 application topically 2 (two) times daily. Patient not taking: Reported on 11/25/2018 12/13/17   Plotnikov, Evie Lacks, MD    Family History Family History  Problem Relation Age of Onset  . Jaundice Father   . Stroke Son 25       in NH  . Cancer Mother        uterine  . Diabetes Mother   . Other Sister        brain tumor  . Cancer Sister   . Hypertension Other   . Cancer Other        breast  . Thyroid disease Sister   . Cancer Sister        thyroid cancer    Social History Social History   Tobacco Use  . Smoking status: Never Smoker  . Smokeless tobacco: Never Used  Substance Use Topics  . Alcohol use: No    Alcohol/week: 0.0 standard drinks  . Drug use: No     Allergies   Pneumococcal vaccines; Clarithromycin; Hctz [hydrochlorothiazide]; Oxycodone-aspirin; and Propoxyphene n-acetaminophen   Review of Systems Review of Systems  Constitutional: Positive for activity change.  Respiratory: Negative for shortness of breath.   Cardiovascular: Negative for chest pain and palpitations.  Gastrointestinal: Negative for nausea and vomiting.  Neurological: Positive for syncope.  Hematological: Does not bruise/bleed easily.     Physical Exam Updated Vital Signs BP 138/73   Pulse (!) 59   Temp 98 F (36.7 C) (Oral)   Resp 17   SpO2 95%   Physical Exam Vitals signs and nursing note reviewed.  Constitutional:      Appearance: She is well-developed.  HENT:     Head: Normocephalic and atraumatic.  Neck:     Musculoskeletal: Normal range of motion and neck supple.  Cardiovascular:     Rate and Rhythm: Normal rate.     Heart sounds: Murmur present.  Pulmonary:     Effort: Pulmonary effort is normal.  Abdominal:     General: Bowel sounds are normal.  Skin:    General: Skin is warm and  dry.  Neurological:  Mental Status: She is alert and oriented to person, place, and time.      ED Treatments / Results  Labs (all labs ordered are listed, but only abnormal results are displayed) Labs Reviewed  BASIC METABOLIC PANEL - Abnormal; Notable for the following components:      Result Value   Glucose, Bld 111 (*)    Creatinine, Ser 1.08 (*)    GFR calc non Af Amer 45 (*)    GFR calc Af Amer 52 (*)    All other components within normal limits  CBC WITH DIFFERENTIAL/PLATELET - Abnormal; Notable for the following components:   Platelets 147 (*)    All other components within normal limits  SARS CORONAVIRUS 2 (HOSPITAL ORDER, [facility])  CBG MONITORING, ED    EKG EKG Interpretation  Date/Time:  Monday Nov 25 2018 12:35:38 EDT Ventricular Rate:  84 PR Interval:    QRS Duration: 89 QT Interval:  437 QTC Calculation: 517 R Axis:   -9 Text Interpretation:  Sinus or junctional rhythm Prolonged PR interval Prolonged QT interval No significant change since last tracing Confirmed by Varney Biles [phone]) on 11/25/2018 12:45:28 PM   Radiology No results found.  Procedures Procedures (including critical care time)  Medications Ordered in ED Medications - No data to display   Initial Impression / Assessment and Plan / ED Course  I have reviewed the triage vital signs and the nursing notes.  Pertinent labs & imaging results that were available during my care of the patient were reviewed by me and considered in my medical decision making (see chart for details).        85 year old comes in a chief complaint of syncope.  She has history of CLL, but there is no documented cardiac history or neurologic disease history.  DDx includes: Orthostatic hypotension Stroke Vertebral artery dissection/stenosis Dysrhythmia PE Vasovagal/neurocardiogenic syncope Aortic stenosis Valvular disorder/Cardiomyopathy Anemia  85 year old comes in a  chief complaint of syncope.  She had no prodrome of any kind prior to her episode.  Patient was playing bingo when she had the witnessed episode.  She has no known cardiac disease history.  On EKG she has no acute findings, but it appears like she might have a junctional rhythm.  EKG does not have any new findings.  Basic labs ordered and they are reassuring.  We will admit her for syncope admission.  Final Clinical Impressions(s) / ED Diagnoses   Final diagnoses:  Syncope and collapse    ED Discharge Orders    None       Varney Biles, MD 11/25/18 1609

## 2018-11-25 NOTE — ED Triage Notes (Signed)
Patient BIB EMS from [facility] home. Patient was playing bingo when she experienced a syncopal episode. Per staff, patient was sitting in a chair, became pale, and nodded back in the chair when she lost consciousness for approx 30 seconds to a minute. Staff denies head injury. Patient blood sugar for facility was 121. Patient denies pain or complaint at this time.  Patient was sent for evaluation due to her heart history.  20G Left AC PIV placed by EMS. Patient AxOx4.  EMS VS: 109/65, 70 SR

## 2018-11-25 NOTE — ED Notes (Signed)
ED TO INPATIENT HANDOFF REPORT  ED Nurse Name and Phone #: [phone]   S Name/Age/Gender [name] 85 y.o. female Room/Bed: WA07/WA07  Code Status   Code Status: DNR  Home/SNF/Other Nursing Home Patient oriented to: self, place and situation Is this baseline? Yes   Triage Complete: Triage complete  Chief Complaint syncope  Triage Note Patient BIB EMS from [facility]. Patient was playing bingo when she experienced a syncopal episode. Per staff, patient was sitting in a chair, became pale, and nodded back in the chair when she lost consciousness for approx 30 seconds to a minute. Staff denies head injury. Patient blood sugar for facility was 121. Patient denies pain or complaint at this time.  Patient was sent for evaluation due to her heart history.  20G Left AC PIV placed by EMS. Patient AxOx4.  EMS VS: 109/65, 70 SR   Allergies Allergies  Allergen Reactions  . Pneumococcal Vaccines Swelling and Rash    Pneumococcal Vaccine-23 valent  . Clarithromycin Other (See Comments)    REACTION: sore mouth  . Hctz [Hydrochlorothiazide]     Low Na  . Oxycodone-Aspirin Other (See Comments)    REACTION: horrible nightmares  . Propoxyphene N-Acetaminophen Other (See Comments)    REACTION: couldn't wake her up    Level of Care/Admitting Diagnosis ED Disposition    ED Disposition Condition [facility] Area: [place] [[id number]]  Level of Care: Telemetry [5]  Admit to tele based on following criteria: Complex arrhythmia (Bradycardia/Tachycardia)  Covid Evaluation: Screening Protocol (No Symptoms)  Diagnosis: Syncope [206001]  Admitting Physician: Gerlean Ren [facility] [[id number]]  Attending Physician: Gerlean Ren [facility] [[id number]]  PT Class (Do Not Modify): Observation [104]  PT Acc Code (Do Not Modify): Observation [10022]       B Medical/Surgery History Past Medical History:  Diagnosis Date  . Allergy    rhinitis  .  Chronic ethmoidal sinusitis   . Diverticulosis   . Diverticulosis of colon   . GERD (gastroesophageal reflux disease)   . Glaucoma   . Hx of colonic polyps   . Hyperlipidemia   . Hypertension   . Hypogammaglobulinemia ([place])    Monthly IVIG  . Leukemia ([place])    Chronic lymphocytic leukemia  . Osteoarthritis   . Peripheral neuropathy   . Pernicious anemia   . UTI (lower urinary tract infection) 2010  . Vitamin B12 deficiency    Past Surgical History:  Procedure Laterality Date  . ABDOMINAL HYSTERECTOMY  1987  . bilateral cataracts  2007  . bladder tack  1987  . L4-L5 Laminetomy  1999  . TUBAL LIGATION    . tubaligation  1961  . ureter reconstruction  1992     A IV Location/Drains/Wounds Patient Lines/Drains/Airways Status   Active Line/Drains/Airways    None          Intake/Output Last 24 hours No intake or output data in the 24 hours ending 11/25/18 1710  Labs/Imaging Results for orders placed or performed during the hospital encounter of 11/25/18 (from the past 48 hour(s))  CBG monitoring, ED     Status: None   Collection Time: 11/25/18 12:40 PM  Result Value Ref Range   Glucose-Capillary 99 70 - 99 mg/dL  Basic metabolic panel     Status: Abnormal   Collection Time: 11/25/18  1:11 PM  Result Value Ref Range   Sodium 139 135 - 145 mmol/L   Potassium 3.7 3.5 - 5.1 mmol/L  Chloride 104 98 - 111 mmol/L   CO2 26 22 - 32 mmol/L   Glucose, Bld 111 (H) 70 - 99 mg/dL   BUN 21 8 - 23 mg/dL   Creatinine, Ser 1.08 (H) 0.44 - 1.00 mg/dL   Calcium 9.2 8.9 - 10.3 mg/dL   GFR calc non Af Amer 45 (L) >60 mL/min   GFR calc Af Amer 52 (L) >60 mL/min   Anion gap 9 5 - 15    Comment: Performed at [facility], [place] [address], [place], [place] [address]  CBC WITH DIFFERENTIAL     Status: Abnormal   Collection Time: 11/25/18  1:11 PM  Result Value Ref Range   WBC 6.5 4.0 - 10.5 K/uL   RBC 4.54 3.87 - 5.11 MIL/uL   Hemoglobin 13.9 12.0 - 15.0 g/dL    HCT 43.3 36.0 - 46.0 %   MCV 95.4 80.0 - 100.0 fL   MCH 30.6 26.0 - 34.0 pg   MCHC 32.1 30.0 - 36.0 g/dL   RDW 14.4 11.5 - 15.5 %   Platelets 147 (L) 150 - 400 K/uL   nRBC 0.0 0.0 - 0.2 %   Neutrophils Relative % 67 %   Neutro Abs 4.4 1.7 - 7.7 K/uL   Lymphocytes Relative 23 %   Lymphs Abs 1.5 0.7 - 4.0 K/uL   Monocytes Relative 7 %   Monocytes Absolute 0.4 0.1 - 1.0 K/uL   Eosinophils Relative 2 %   Eosinophils Absolute 0.1 0.0 - 0.5 K/uL   Basophils Relative 1 %   Basophils Absolute 0.0 0.0 - 0.1 K/uL   Immature Granulocytes 0 %   Abs Immature Granulocytes 0.02 0.00 - 0.07 K/uL    Comment: Performed at [facility], [place] [address] [place], [place] [address]  CBG monitoring, ED     Status: Abnormal   Collection Time: 11/25/18  5:07 PM  Result Value Ref Range   Glucose-Capillary 119 (H) 70 - 99 mg/dL   No results found.  Pending Labs Unresulted Labs (From admission, onward)    Start     Ordered   12/02/18 0500  Creatinine, serum  (enoxaparin (LOVENOX)    CrCl >/= 30 ml/min)  Weekly,   R    Comments:  while on enoxaparin therapy    11/25/18 1658   11/26/18 0500  Comprehensive metabolic panel  Tomorrow morning,   R     11/25/18 1658   11/26/18 0500  Magnesium  Tomorrow morning,   R     11/25/18 1658   11/26/18 0500  CBC  Tomorrow morning,   R     11/25/18 1658   11/25/18 1658  CBC  (enoxaparin (LOVENOX)    CrCl >/= 30 ml/min)  Once,   R    Comments:  Baseline for enoxaparin therapy IF NOT ALREADY DRAWN.  Notify MD if PLT < 100 K.    11/25/18 1658   11/25/18 1658  Creatinine, serum  (enoxaparin (LOVENOX)    CrCl >/= 30 ml/min)  Once,   R    Comments:  Baseline for enoxaparin therapy IF NOT ALREADY DRAWN.    11/25/18 1658   11/25/18 1658  TSH  Once,   R     11/25/18 1658   11/25/18 1605  SARS Coronavirus 2 (CEPHEID - Performed in [place] hospital lab), [place]  (Asymptomatic Patients Labs)  Once,   R    Question:  Rule Out  Answer:  Yes  11/25/18 1604          Vitals/Pain Today's Vitals   11/25/18 1415 11/25/18 1500 11/25/18 1630 11/25/18 1645  BP: 108/72 138/73 (!) 102/59   Pulse: (!) 40 (!) 59 80 79  Resp: 18 17 (!) 24 (!) 25  Temp:      TempSrc:      SpO2: 91% 95% 95% 92%    Isolation Precautions No active isolations  Medications Medications  Memantine HCl-Donepezil HCl 28-10 MG CP24 1 capsule (has no administration in time range)  traZODone (DESYREL) tablet 25 mg (has no administration in time range)  pantoprazole (PROTONIX) EC tablet 40 mg (has no administration in time range)  mirabegron ER (MYRBETRIQ) tablet 50 mg (has no administration in time range)  folic acid (FOLVITE) tablet 1 mg (has no administration in time range)  enoxaparin (LOVENOX) injection 40 mg (has no administration in time range)  0.9 %  sodium chloride infusion (has no administration in time range)  acetaminophen (TYLENOL) tablet 650 mg (has no administration in time range)    Or  acetaminophen (TYLENOL) suppository 650 mg (has no administration in time range)  oxyCODONE (Oxy IR/ROXICODONE) immediate release tablet 5 mg (has no administration in time range)  senna-docusate (Senokot-S) tablet 1 tablet (has no administration in time range)  bisacodyl (DULCOLAX) EC tablet 5 mg (has no administration in time range)  insulin aspart (novoLOG) injection 0-9 Units (has no administration in time range)    Mobility walks with device High fall risk   Focused Assessments    R Recommendations: See Admitting Provider Note  Report given to:   Additional Notes:

## 2018-11-25 NOTE — H&P (Signed)
History and Physical    TARRI GUILFOIL QZE:[id number] DOB: [birth year] DOA: 11/25/2018  PCP: Cassandria Anger, MD Patient coming from: [facility]  Chief Complaint: Loss of consciousness  HPI: [name] is a 85 y.o. female with medical history significant of history of CLL, hypogammaglobulinemia on monthly IVIG, GERD, recurrent UTIs, dementia was brought to the hospital for evaluation of syncope.  [facility] resides at [facility] and while playing bingo she had an episode of syncope.  Unclear history from her and the staff at nursing home taking care of her is already gone per the ER provider.  I tried calling patient's son Jori Moll but he did not answer.  Per ED staff and reported appears that patient had about 32nd loss of consciousness without any head injury.  At that time blood glucose was stable. In the ER her labs showed evidence of mild dehydration and acute kidney injury.  No focal neuro deficits were appreciated.  Patient denied any complaints and does not remember the events of what happened.  Given her advanced age and comorbidities, it was decided to admit her for observation and hydration.   Review of Systems: As per HPI otherwise 10 point review of systems negative.  Review of Systems Otherwise negative except as per HPI, including: General: Denies fever, chills, night sweats or unintended weight loss. Resp: Denies cough, wheezing, shortness of breath. Cardiac: Denies chest pain, palpitations, orthopnea, paroxysmal nocturnal dyspnea. GI: Denies abdominal pain, nausea, vomiting, diarrhea or constipation GU: Denies dysuria, frequency, hesitancy or incontinence MS: Denies muscle aches, joint pain or swelling Neuro: Denies headache, neurologic deficits (focal weakness, numbness, tingling), abnormal gait Psych: Denies anxiety, depression, SI/HI/AVH Skin: Denies new rashes or lesions ID: Denies sick contacts, exotic exposures, travel  Past Medical History:   Diagnosis Date  . Allergy    rhinitis  . Chronic ethmoidal sinusitis   . Diverticulosis   . Diverticulosis of colon   . GERD (gastroesophageal reflux disease)   . Glaucoma   . Hx of colonic polyps   . Hyperlipidemia   . Hypertension   . Hypogammaglobulinemia ([place])    Monthly IVIG  . Leukemia ([place])    Chronic lymphocytic leukemia  . Osteoarthritis   . Peripheral neuropathy   . Pernicious anemia   . UTI (lower urinary tract infection) 2010  . Vitamin B12 deficiency     Past Surgical History:  Procedure Laterality Date  . ABDOMINAL HYSTERECTOMY  1987  . bilateral cataracts  2007  . bladder tack  1987  . L4-L5 Laminetomy  1999  . TUBAL LIGATION    . tubaligation  1961  . ureter reconstruction  1992    SOCIAL HISTORY:  reports that she has never smoked. She has never used smokeless tobacco. She reports that she does not drink alcohol or use drugs.  Allergies  Allergen Reactions  . Pneumococcal Vaccines Swelling and Rash    Pneumococcal Vaccine-23 valent  . Clarithromycin Other (See Comments)    REACTION: sore mouth  . Hctz [Hydrochlorothiazide]     Low Na  . Oxycodone-Aspirin Other (See Comments)    REACTION: horrible nightmares  . Propoxyphene N-Acetaminophen Other (See Comments)    REACTION: couldn't wake her up    FAMILY HISTORY: Family History  Problem Relation Age of Onset  . Jaundice Father   . Stroke Son 52       in NH  . Cancer Mother        uterine  . Diabetes Mother   .  Other Sister        brain tumor  . Cancer Sister   . Hypertension Other   . Cancer Other        breast  . Thyroid disease Sister   . Cancer Sister        thyroid cancer     Prior to Admission medications   Medication Sig Start Date End Date Taking? Authorizing Provider  acetaminophen (TYLENOL) 500 MG tablet Take 500 mg by mouth 2 (two) times daily.    Yes [provider]  alum & mag hydroxide-simeth (MI-ACID) 200-200-20 MG/5ML suspension Take 30 mLs by mouth  every 4 (four) hours as needed for indigestion or heartburn.   Yes [provider]  calcium carbonate (ANTACID) 500 MG chewable tablet Chew 2 tablets by mouth every 4 (four) hours as needed for indigestion or heartburn.   Yes [provider]  Cholecalciferol (VITAMIN D3) 50 MCG (2000 UT) TABS Take 50 mcg by mouth daily.   Yes [provider]  cromolyn (OPTICROM) 4 % ophthalmic solution Place 1 drop into both eyes at bedtime.  03/21/16  Yes [provider]  fluticasone (FLONASE) 50 MCG/ACT nasal spray Place 2 sprays into both nostrils daily. 11/21/16  Yes Plotnikov, Evie Lacks, MD  folic acid (FOLVITE) 1 MG tablet Take 1 mg by mouth daily. For restless legs   Yes [provider]  lansoprazole (PREVACID) 30 MG capsule Take 30 mg by mouth daily at 12 noon.   Yes [provider]  latanoprost (XALATAN) 0.005 % ophthalmic solution PLACE 1 DROP INTO BOTH EYES AT BEDTIME. 12/31/17  Yes Plotnikov, Evie Lacks, MD  loratadine (CLARITIN) 10 MG tablet TAKE 1 TABLET (10 MG TOTAL) BY MOUTH DAILY. Patient taking differently: Take 10 mg by mouth daily.  11/19/17  Yes Plotnikov, Evie Lacks, MD  Memantine HCl-Donepezil HCl 28-10 MG CP24 Take 1 capsule by mouth daily. 12/13/17  Yes Plotnikov, Evie Lacks, MD  mirabegron ER (MYRBETRIQ) 50 MG TB24 tablet Take 1 tablet (50 mg total) by mouth daily. 11/20/17  Yes Plotnikov, Evie Lacks, MD  Multiple Vitamins-Minerals (CERTAVITE/ANTIOXIDANTS) TABS Take 1 tablet by mouth daily.   Yes [provider]  phenol (CHLORASEPTIC) 1.4 % LIQD Use as directed 5 sprays in the mouth or throat every 6 (six) hours as needed for throat irritation / pain.   Yes [provider]  polyethylene glycol (MIRALAX / GLYCOLAX) packet Take 17 g by mouth daily. Patient taking differently: Take 17 g by mouth daily as needed for mild constipation.  12/21/17  Yes Mikhail, Maryann, DO  potassium chloride (MICRO-K) 10 MEQ CR capsule Take 10 mEq by  mouth daily. 09/06/18  Yes [provider]  PRESCRIPTION MEDICATION Take 1 Dose by mouth 3 (three) times daily as needed (mouth pain). [place] Magic Mouthwash: Swish, hold, & spit   Yes [provider]  Probiotic Product (PROBIOTIC PO) Take 1 capsule by mouth daily.   Yes [provider]  senna-docusate (SENOKOT-S) 8.6-50 MG tablet Take 2 tablets by mouth 2 (two) times daily. 12/23/17  Yes Patrecia Pour, Christean Grief, MD  traZODone (DESYREL) 50 MG tablet Take 25 mg by mouth at bedtime. 09/11/18  Yes [provider]  B-D INS SYRINGE 0.5CC/30GX1/2" 30G X 1/2" 0.5 ML MISC USE TO ADMINISTER B12 INJECTIONS 06/13/17   Plotnikov, Evie Lacks, MD  Cholecalciferol (VITAMIN D3) 2000 units capsule Take 1 capsule (2,000 Units total) by mouth daily. Patient not taking: Reported on 11/25/2018 11/23/16   Plotnikov, [employer]  V, MD  cyanocobalamin (,VITAMIN B-12,) 1000 MCG/ML injection ADMINISTER 1 CC SUBQ EVERY 4 WEEKS AS DIRECTED Patient taking differently: Inject 1,000 mcg into the muscle every 30 (thirty) days.  08/07/16   Plotnikov, Evie Lacks, MD  donepezil (ARICEPT) 5 MG tablet TAKE 1 TABLET BY MOUTH EVERYDAY AT BEDTIME Patient not taking: Reported on 11/25/2018 12/27/17   Plotnikov, Evie Lacks, MD  feeding supplement, ENSURE ENLIVE, (ENSURE ENLIVE) LIQD Take 237 mLs by mouth 3 (three) times daily between meals. Patient not taking: Reported on 11/25/2018 12/20/17   Cristal Ford, DO  Insulin Syringe-Needle U-100 (BD INSULIN SYRINGE U/F) 30G X 1/2" 0.5 ML MISC USE TO ADMINISTER B12 INJECTIONS 06/13/17   [provider]  KLOR-CON M10 10 MEQ tablet TAKE 1 TABLET (10 MEQ TOTAL) BY MOUTH DAILY. Patient not taking: Reported on 11/25/2018 11/05/17   Plotnikov, Evie Lacks, MD  sucralfate (CARAFATE) 1 g tablet TAKE 1 TABLET (1 G TOTAL) BY MOUTH 4 (FOUR) TIMES DAILY - WITH MEALS AND AT BEDTIME. Patient not taking: Reported on 11/25/2018 01/28/18   Plotnikov, Evie Lacks, MD  triamcinolone ointment (KENALOG)  0.1 % Apply 1 application topically 2 (two) times daily. Patient not taking: Reported on 11/25/2018 12/13/17   Cassandria Anger, MD    Physical Exam: Vitals:   11/25/18 1415 11/25/18 1500 11/25/18 1630 11/25/18 1645  BP: 108/72 138/73 (!) 102/59   Pulse: (!) 40 (!) 59 80 79  Resp: 18 17 (!) 24 (!) 25  Temp:      TempSrc:      SpO2: 91% 95% 95% 92%      Constitutional: Slight distress during my encounter as she was having right lower extremity cramping at that time.  Calm, comfortable, elderly Eyes: PERRL, lids and conjunctivae normal ENMT: Mucous membranes are dry. Posterior pharynx clear of any exudate or lesions.Normal dentition.  Neck: normal, supple, no masses, no thyromegaly Respiratory: clear to auscultation bilaterally, no wheezing, no crackles. Normal respiratory effort. No accessory muscle use.  Cardiovascular: Regular rate and rhythm, no murmurs / rubs / gallops. No extremity edema. 2+ pedal pulses. No carotid bruits.  Abdomen: no tenderness, no masses palpated. No hepatosplenomegaly. Bowel sounds positive.  Musculoskeletal: no clubbing / cyanosis. No joint deformity upper and lower extremities. Good ROM, no contractures. Normal muscle tone.  Skin: no rashes, lesions, ulcers. No induration Neurologic: CN 2-12 grossly intact. Sensation intact, DTR normal. Strength 4/5 in all 4.  Psychiatric: Poor judgment and insight. Alert and oriented x 3. Normal mood.     Labs on Admission: I have personally reviewed following labs and imaging studies  CBC: Recent Labs  Lab 11/25/18 1311  WBC 6.5  NEUTROABS 4.4  HGB 13.9  HCT 43.3  MCV 95.4  PLT 970*   Basic Metabolic Panel: Recent Labs  Lab 11/25/18 1311  NA 139  K 3.7  CL 104  CO2 26  GLUCOSE 111*  BUN 21  CREATININE 1.08*  CALCIUM 9.2   GFR: CrCl cannot be calculated (Unknown ideal weight.). Liver Function Tests: No results for input(s): AST, ALT, ALKPHOS, BILITOT, PROT, ALBUMIN in the last 168 hours. No  results for input(s): LIPASE, AMYLASE in the last 168 hours. No results for input(s): AMMONIA in the last 168 hours. Coagulation Profile: No results for input(s): INR, PROTIME in the last 168 hours. Cardiac Enzymes: No results for input(s): CKTOTAL, CKMB, CKMBINDEX, TROPONINI in the last 168 hours. BNP (last 3 results) No results for input(s): PROBNP in the last 8760 hours. HbA1C:  No results for input(s): HGBA1C in the last 72 hours. CBG: Recent Labs  Lab 11/25/18 1240 11/25/18 1707  GLUCAP 99 119*   Lipid Profile: No results for input(s): CHOL, HDL, LDLCALC, TRIG, CHOLHDL, LDLDIRECT in the last 72 hours. Thyroid Function Tests: No results for input(s): TSH, T4TOTAL, FREET4, T3FREE, THYROIDAB in the last 72 hours. Anemia Panel: No results for input(s): VITAMINB12, FOLATE, FERRITIN, TIBC, IRON, RETICCTPCT in the last 72 hours. Urine analysis:    Component Value Date/Time   COLORURINE AMBER (A) 10/01/2018 0925   APPEARANCEUR CLOUDY (A) 10/01/2018 0925   LABSPEC 1.017 10/01/2018 0925   LABSPEC 1.010 04/03/2017 0950   PHURINE 7.0 10/01/2018 0925   GLUCOSEU NEGATIVE 10/01/2018 0925   GLUCOSEU NEGATIVE 09/25/2017 1030   GLUCOSEU Negative 04/03/2017 0950   HGBUR SMALL (A) 10/01/2018 0925   HGBUR trace-intact 07/07/2008 1058   BILIRUBINUR NEGATIVE 10/01/2018 0925   BILIRUBINUR negative 11/16/2017 1653   BILIRUBINUR neg 11/02/2017 1524   BILIRUBINUR Negative 04/03/2017 0950   KETONESUR NEGATIVE 10/01/2018 0925   PROTEINUR 30 (A) 10/01/2018 0925   UROBILINOGEN 0.2 11/16/2017 1653   UROBILINOGEN 0.2 09/25/2017 1030   UROBILINOGEN 0.2 04/03/2017 0950   NITRITE NEGATIVE 10/01/2018 0925   LEUKOCYTESUR TRACE (A) 10/01/2018 0925   LEUKOCYTESUR Trace 04/03/2017 0950   Sepsis Labs: !!!!!!!!!!!!!!!!!!!!!!!!!!!!!!!!!!!!!!!!!!!! @LABRCNTIP (procalcitonin:4,lacticidven:4) )No results found for this or any previous visit (from the past 240 hour(s)).   Radiological Exams on Admission: No  results found.   All images have been reviewed by me personally.  EKG: Independently reviewed.  Possible junctional rhythm  Assessment/Plan Principal Problem:   Syncope Active Problems:   Chronic lymphocytic leukemia (H[facility])   B12 deficiency   Peripheral neuropathy ([facility])   Essential hypertension   GERD (gastroesophageal reflux disease)    Syncope Moderate dehydration - Admit the patient for observation.  Unknown exact etiology, suspicion for dehydration therefore we will give her IV fluids and watch her on telemetry.  Currently her mentation is well and denies any complaints while she is laying flat.  If fluids does not resolve her symptoms, will consider getting further work-up including echocardiogram and vascular ultrasound of carotids. -Check orthostatic.  Repeat EKG again tomorrow morning. -CT of the head not done in the ER, currently mentation is at baseline without any focal neuro deficits therefore will hold off on radiologic imaging of her head.  Acute kidney injury -Baseline creatinine 0.6.  Today it is 1.08. IV fluids and monitor renal function.  Recurrent UTIs - Possibly led to dehydration this time.  Will check UA.  Hold off on antibiotics for now.  History of chronic lymphocytic leukemia Hypogammaglobulinemia -Gets monthly IVIG infusion.  GERD -PPI  Dementia without behavioral disturbances -Supportive care   DVT prophylaxis: Lovenox Code Status: DNR per previous records Family Communication: Called patient's son, Jori Moll but did not answer.  Voicemail not set up therefore could not leave a voicemail Disposition Plan:  TBD Consults called: None Admission status: Observation Tele   Time Spent: 65 minutes.  >50% of the time was devoted to discussing the patients care, assessment, plan and disposition with other care givers along with counseling the patient about the risks and benefits of treatment.    Leiam Hopwood Arsenio Loader MD [facility]  If 7PM-7AM,  please contact night-coverage [email or web address]  11/25/2018, 5:23 PM

## 2018-11-25 NOTE — ED Notes (Signed)
Pt cleaned up Pt had small BM, and was placed on pure wick

## 2018-11-25 NOTE — ED Notes (Signed)
Bed: WA07 Expected date:  Expected time:  Means of arrival:  Comments: EMS 85yo syncope

## 2018-11-26 ENCOUNTER — Observation Stay (HOSPITAL_BASED_OUTPATIENT_CLINIC_OR_DEPARTMENT_OTHER): Payer: Medicare Other

## 2018-11-26 DIAGNOSIS — Z66 Do not resuscitate: Secondary | ICD-10-CM | POA: Diagnosis present

## 2018-11-26 DIAGNOSIS — Z881 Allergy status to other antibiotic agents status: Secondary | ICD-10-CM | POA: Diagnosis not present

## 2018-11-26 DIAGNOSIS — Z888 Allergy status to other drugs, medicaments and biological substances status: Secondary | ICD-10-CM | POA: Diagnosis not present

## 2018-11-26 DIAGNOSIS — R55 Syncope and collapse: Secondary | ICD-10-CM

## 2018-11-26 DIAGNOSIS — N179 Acute kidney failure, unspecified: Secondary | ICD-10-CM | POA: Diagnosis present

## 2018-11-26 DIAGNOSIS — N390 Urinary tract infection, site not specified: Secondary | ICD-10-CM | POA: Diagnosis present

## 2018-11-26 DIAGNOSIS — Z886 Allergy status to analgesic agent status: Secondary | ICD-10-CM | POA: Diagnosis not present

## 2018-11-26 DIAGNOSIS — D801 Nonfamilial hypogammaglobulinemia: Secondary | ICD-10-CM | POA: Diagnosis present

## 2018-11-26 DIAGNOSIS — F0390 Unspecified dementia without behavioral disturbance: Secondary | ICD-10-CM | POA: Diagnosis present

## 2018-11-26 DIAGNOSIS — Z885 Allergy status to narcotic agent status: Secondary | ICD-10-CM | POA: Diagnosis not present

## 2018-11-26 DIAGNOSIS — K219 Gastro-esophageal reflux disease without esophagitis: Secondary | ICD-10-CM | POA: Diagnosis present

## 2018-11-26 DIAGNOSIS — E538 Deficiency of other specified B group vitamins: Secondary | ICD-10-CM | POA: Diagnosis present

## 2018-11-26 DIAGNOSIS — Z90710 Acquired absence of both cervix and uterus: Secondary | ICD-10-CM | POA: Diagnosis not present

## 2018-11-26 DIAGNOSIS — Z87440 Personal history of urinary (tract) infections: Secondary | ICD-10-CM | POA: Diagnosis not present

## 2018-11-26 DIAGNOSIS — Z7951 Long term (current) use of inhaled steroids: Secondary | ICD-10-CM | POA: Diagnosis not present

## 2018-11-26 DIAGNOSIS — I10 Essential (primary) hypertension: Secondary | ICD-10-CM | POA: Diagnosis present

## 2018-11-26 DIAGNOSIS — E860 Dehydration: Secondary | ICD-10-CM | POA: Diagnosis present

## 2018-11-26 DIAGNOSIS — Z887 Allergy status to serum and vaccine status: Secondary | ICD-10-CM | POA: Diagnosis not present

## 2018-11-26 DIAGNOSIS — Z8249 Family history of ischemic heart disease and other diseases of the circulatory system: Secondary | ICD-10-CM | POA: Diagnosis not present

## 2018-11-26 DIAGNOSIS — H409 Unspecified glaucoma: Secondary | ICD-10-CM | POA: Diagnosis present

## 2018-11-26 DIAGNOSIS — I959 Hypotension, unspecified: Secondary | ICD-10-CM | POA: Diagnosis present

## 2018-11-26 DIAGNOSIS — Z1159 Encounter for screening for other viral diseases: Secondary | ICD-10-CM | POA: Diagnosis not present

## 2018-11-26 DIAGNOSIS — G629 Polyneuropathy, unspecified: Secondary | ICD-10-CM | POA: Diagnosis present

## 2018-11-26 DIAGNOSIS — C9110 Chronic lymphocytic leukemia of B-cell type not having achieved remission: Secondary | ICD-10-CM | POA: Diagnosis present

## 2018-11-26 DIAGNOSIS — E785 Hyperlipidemia, unspecified: Secondary | ICD-10-CM | POA: Diagnosis present

## 2018-11-26 LAB — COMPREHENSIVE METABOLIC PANEL
ALT: 18 U/L (ref 0–44)
AST: 19 U/L (ref 15–41)
Albumin: 3.3 g/dL — ABNORMAL LOW (ref 3.5–5.0)
Alkaline Phosphatase: 41 U/L (ref 38–126)
Anion gap: 9 (ref 5–15)
BUN: 25 mg/dL — ABNORMAL HIGH (ref 8–23)
CO2: 25 mmol/L (ref 22–32)
Calcium: 8.8 mg/dL — ABNORMAL LOW (ref 8.9–10.3)
Chloride: 105 mmol/L (ref 98–111)
Creatinine, Ser: 1 mg/dL (ref 0.44–1.00)
GFR calc Af Amer: 57 mL/min — ABNORMAL LOW (ref >60)
GFR calc non Af Amer: 50 mL/min — ABNORMAL LOW (ref >60)
Glucose, Bld: 77 mg/dL (ref 70–99)
Potassium: 3.2 mmol/L — ABNORMAL LOW (ref 3.5–5.1)
Sodium: 139 mmol/L (ref 135–145)
Total Bilirubin: 0.6 mg/dL (ref 0.3–1.2)
Total Protein: 6.3 g/dL — ABNORMAL LOW (ref 6.5–8.1)

## 2018-11-26 LAB — CBC
HCT: 41 % (ref 36.0–46.0)
Hemoglobin: 12.9 g/dL (ref 12.0–15.0)
MCH: 30.1 pg (ref 26.0–34.0)
MCHC: 31.5 g/dL (ref 30.0–36.0)
MCV: 95.8 fL (ref 80.0–100.0)
Platelets: 144 10*3/uL — ABNORMAL LOW (ref 150–400)
RBC: 4.28 MIL/uL (ref 3.87–5.11)
RDW: 14.6 % (ref 11.5–15.5)
WBC: 5.2 10*3/uL (ref 4.0–10.5)
nRBC: 0 % (ref 0.0–0.2)

## 2018-11-26 LAB — GLUCOSE, CAPILLARY
Glucose-Capillary: 80 mg/dL (ref 70–99)
Glucose-Capillary: 90 mg/dL (ref 70–99)
Glucose-Capillary: 99 mg/dL (ref 70–99)
Glucose-Capillary: 102 mg/dL — ABNORMAL HIGH (ref 70–99)

## 2018-11-26 LAB — ECHOCARDIOGRAM COMPLETE
Height: 66 in
Weight: 2574.97 oz

## 2018-11-26 LAB — MAGNESIUM: Magnesium: 2.1 mg/dL (ref 1.7–2.4)

## 2018-11-26 MED ORDER — POTASSIUM CHLORIDE 20 MEQ PO PACK
40.0000 meq | PACK | Freq: Once | ORAL | Status: AC
  Administered 2018-11-26: 40 meq via ORAL
  Filled 2018-11-26: qty 2

## 2018-11-26 MED ORDER — SODIUM CHLORIDE 0.9 % IV SOLN: INTRAVENOUS | Status: DC

## 2018-11-26 MED ORDER — ALUM & MAG HYDROXIDE-SIMETH 200-200-20 MG/5ML PO SUSP
30.0000 mL | ORAL | Status: DC | PRN
  Administered 2018-11-26 (×2): 30 mL via ORAL
  Filled 2018-11-26 (×2): qty 30

## 2018-11-26 NOTE — Progress Notes (Addendum)
[facility] PROGRESS NOTE  [name] VZC:[id number] DOB: November 18, 1925 DOA: 11/25/2018 PCP: Cassandria Anger, MD  Assessment/Plan:  Syncope Patient is limited historian due to dementia but is able to say the staff though her color had changed while playing bingo, she is unable to remember any symptoms prior to or any events afterwards.  Differential includes orthostatic hypotension, dysrhythmia, valvular disease, cardiomyopathy, neurocardiogenic syncope, potential sepsis given hypotension overnight and urinary complaints addressed more below.  Patient has no known heart history no significant medical history is CLL.  EKG, TTE, and repeat orthostatics reassuring.  Monitor on telemetry. on IVF.  Not on any contributing BP medications. Complained of intermittent dysuria, awaiting urinalysis, otherwise no localizing signs or symptoms. Pt eval  Mild AKI, likely related to dehydration Baseline creatinine 0.6-0.7. Slight improvement from 1.08 on admission to 1. GFR is 50 ( baseline > 60). Will continue IVF given  hypotension overnight she is maintaining oral intake but her BP dropped while on IVF overnight, hesitant to discontinue. No clear etiology on home meds, avoid nephrotoxins, monitor output and BMP  Hypotension, improving, currently normotensive on IVF systolics as low as 12I overnight(6 pm, 63/40, 87/62, 94/78), then improved to 111/58. Repeat orthostatic vitals negative. Unclear etiology as she is not on any BP meds. . Continue IVF.   Recurrent UTI hx Check UA. Does complain of some dysuria that has been ongoing for several weeks off and on. With significant hypotension overnight warrants obtaining UA. Will not empirically start antibiotics unless patient becomes febrile, shows recurrent hypotension or other signs of sepsis.   Dementia, stable. No behavioral disturbances and memory at baseline. Aricept on med rec but patient not taking. Delirium precautions to avoid any mental status  changes.   Code Status: DNR, discussed by admitting provider  Family Communication: none at bedside (indicate person spoken with, relationship, and if by phone, the number) Disposition Plan: repeat BMP in am to ensure improvement in Cr with additional timed IVF, PT evaluation, likely dc in 24 hours    Consultants:  none  Procedures:  TTE, 5/5  Antibiotics:  none (indicate start date, and stop date if known)  HPI/Subjective:  [name] is a 85 y.o. year old female with medical history significant for CLL, hypogammaglobulinemia on monthly IVIG, GERD, recurrent UTIs, dementia  who presented from [facility] on 11/25/2018 with reportedly with 30 seconds loss of consciousness without head injury at that time blood glucose was within normal limits.  Per ED documentation while playing bingo patient experienced "syncopal episode).  She was sitting in her chair suddenly became pale, not back in her chair and lost consciousness for approximately 30 seconds to a minute.  Staff denied any head injury her blood sugar at that time was 121. she was sent to the ED for further evaluation due to her reported heart history.  In the ED patient was afebrile, with blood pressure ranging 07/25/1957-130/70 3 mm oxygen saturation on room air Lab work significant for TSH 3.76 glucose 119, COVID-19 testing negative BUN 21, creatinine 1.08 (25-0.8) unremarkable CBC aside from platelet count of 137.7 (chronic).  [facility] for evaluation of syncope  Reports chronic gerd at baseline. She has been taking reflux medication off and on at her facility with intermittent improvement. Does not report any decrease in oral intake This am reports having off and on again pain with urination she thinks  Objective: Vitals:   11/25/18 2202 11/26/18 7867  BP: 100/70 111/60  Pulse: 62 62  Resp: 15 18  Temp: 98.4 F (36.9 C) 98.6 F (37 C)  SpO2: 92% 90%     Intake/Output Summary (Last 24 hours) at 11/26/2018 6389 Last data filed at 11/26/2018 0200 Gross per 24 hour  Intake 769.59 ml  Output -  Net 769.59 ml   Filed Weights   11/25/18 1854 11/26/18 0515  Weight: 70.6 kg 72.1 kg    Exam:   General: Elderly female, lying in bed in no distress  Cardiovascular: Regular rate and rhythm, no appreciable murmurs rubs or gallops, no edema  Respiratory: Normal effort on room air, clear lung sounds  Abdomen: Soft, nondistended, nontender, normal bowel sounds  Musculoskeletal: Normal range of motion  Skin no rashes or lesions  Neurologic alert, oriented to self, context ( knows why she is in hospital), place(knows she's in hospital), , easily distractible and inattentive, able to move all extremities with no appreciable focal deficits and follows simple commands  Data Reviewed: Basic Metabolic Panel: Recent Labs  Lab 11/25/18 1311  NA 139  K 3.7  CL 104  CO2 26  GLUCOSE 111*  BUN 21  CREATININE 1.08*  CALCIUM 9.2   Liver Function Tests: No results for input(s): AST, ALT, ALKPHOS, BILITOT, PROT, ALBUMIN in the last 168 hours. No results for input(s): LIPASE, AMYLASE in the last 168 hours. No results for input(s): AMMONIA in the last 168 hours. CBC: Recent Labs  Lab 11/25/18 1311  WBC 6.5  NEUTROABS 4.4  HGB 13.9  HCT 43.3  MCV 95.4  PLT 147*   Cardiac Enzymes: No results for input(s): CKTOTAL, CKMB, CKMBINDEX, TROPONINI in the last 168 hours. BNP (last 3 results) No results for input(s): BNP in the last 8760 hours.  ProBNP (last 3 results) No results for input(s): PROBNP in the last 8760 hours.  CBG: Recent Labs  Lab 11/25/18 1240 11/25/18 1707 11/25/18 2200  GLUCAP 99 119* 133*    Recent Results (from the past 240 hour(s))  SARS Coronavirus 2 (CEPHEID - Performed in [place] hospital lab), Hosp Order     Status: None   Collection Time: 11/25/18  4:58 PM  Result Value Ref Range Status   SARS  Coronavirus 2 NEGATIVE NEGATIVE Final    Comment: (NOTE) If result is NEGATIVE SARS-CoV-2 target nucleic acids are NOT DETECTED. The SARS-CoV-2 RNA is generally detectable in upper and lower  respiratory specimens during the acute phase of infection. The lowest  concentration of SARS-CoV-2 viral copies this assay can detect is 250  copies / mL. A negative result does not preclude SARS-CoV-2 infection  and should not be used as the sole basis for treatment or other  patient management decisions.  A negative result may occur with  improper specimen collection / handling, submission of specimen other  than nasopharyngeal swab, presence of viral mutation(s) within the  areas targeted by this assay, and inadequate number of viral copies  (<250 copies / mL). A negative result must be combined with clinical  observations, patient history, and epidemiological information. If result is POSITIVE SARS-CoV-2 target nucleic acids are DETECTED. The SARS-CoV-2 RNA is generally detectable in upper and lower  respiratory specimens dur ing the acute phase of infection.  Positive  results are indicative of active infection with SARS-CoV-2.  Clinical  correlation with patient history and other diagnostic information is  necessary to determine patient infection status.  Positive results do  not rule out bacterial infection or co-infection with other  viruses. If result is PRESUMPTIVE POSTIVE SARS-CoV-2 nucleic acids MAY BE PRESENT.   A presumptive positive result was obtained on the submitted specimen  and confirmed on repeat testing.  While 2019 novel coronavirus  (SARS-CoV-2) nucleic acids may be present in the submitted sample  additional confirmatory testing may be necessary for epidemiological  and / or clinical management purposes  to differentiate between  SARS-CoV-2 and other Sarbecovirus currently known to infect humans.  If clinically indicated additional testing with an alternate test   methodology [phone]) is advised. The SARS-CoV-2 RNA is generally  detectable in upper and lower respiratory sp ecimens during the acute  phase of infection. The expected result is Negative. Fact Sheet for Patients:  StrictlyIdeas.no Fact Sheet for Healthcare Providers: BankingDealers.co.za This test is not yet approved or cleared by the [place] FDA and has been authorized for detection and/or diagnosis of SARS-CoV-2 by FDA under an Emergency Use Authorization (EUA).  This EUA will remain in effect (meaning this test can be used) for the duration of the COVID-19 declaration under Section 564(b)(1) of the Act, 21 U.S.C. section 360bbb-3(b)(1), unless the authorization is terminated or revoked sooner. Performed at [facility], [place] [address] [place], [place] [address]      Studies: No results found.  Scheduled Meds: . memantine  28 mg Oral QHS   And  . donepezil  10 mg Oral QHS  . enoxaparin (LOVENOX) injection  40 mg Subcutaneous Q24H  . folic acid  1 mg Oral Daily  . insulin aspart  0-9 Units Subcutaneous TID WC  . mirabegron ER  50 mg Oral Daily  . pantoprazole  40 mg Oral Daily  . traZODone  25 mg Oral QHS   Continuous Infusions: . sodium chloride 75 mL/hr at 11/25/18 1856    Principal Problem:   Syncope Active Problems:   Chronic lymphocytic leukemia (HCC)   B12 deficiency   Peripheral neuropathy (HCC)   Essential hypertension   GERD (gastroesophageal reflux disease)      Desiree Hane  [facility]

## 2018-11-26 NOTE — TOC Initial Note (Signed)
Transition of Care [facility]) - Initial/Assessment Note    Patient Details  Name: [name] MRN: [id number] Date of Birth: April 10, 1926  [facility] of [facility]) CM/SW Contact:    Dessa Phi, RN Phone Number: 11/26/2018, 2:54 PM  Clinical Narrative:Spoke to son Ronald-d/c plans to return back to ALF-[facility] rep-[place]-fax#[phone]-patient can use [facility] for PT, if [facility] needed contact [place]. PT cons await recc.                   Expected Discharge Plan: Assisted Living Barriers to Discharge: No Barriers Identified   Patient Goals and CMS Choice Patient states their goals for this hospitalization and ongoing recovery are:: return back to ALF      Expected Discharge Plan and Services Expected Discharge Plan: Assisted Living   Discharge Planning Services: CM Consult   Living arrangements for the past 2 months: [facility] Expected Discharge Date: (unknown)                                    Prior Living Arrangements/Services Living arrangements for the past 2 months: [place] Lives with:: Self Patient language and need for interpreter reviewed:: Yes Do you feel safe going back to the place where you live?: Yes      Need for Family Participation in Patient Care: No (Comment) Care giver support system in place?: Yes (comment) Current home services: DME(rw) Criminal Activity/Legal Involvement Pertinent to Current Situation/Hospitalization: No - Comment as needed  Activities of Daily Living Home Assistive Devices/Equipment: Grab bars around toilet, Grab bars in shower, Hand-held shower hose, Blood pressure cuff, Walker (specify type), Wheelchair, Hospital bed(upper/lower partial plates, 4 wheeled walker-[facility] hans necessary equipment for their residents) ADL Screening (condition at time of admission) Patient's cognitive ability adequate to safely complete daily activities?: Yes Is the patient deaf or have  difficulty hearing?: No Does the patient have difficulty seeing, even when wearing glasses/contacts?: No Does the patient have difficulty concentrating, remembering, or making decisions?: No Patient able to express need for assistance with ADLs?: Yes Does the patient have difficulty dressing or bathing?: Yes Independently performs ADLs?: No Communication: Independent Dressing (OT): Needs assistance Is this a change from baseline?: Pre-admission baseline Grooming: Needs assistance Is this a change from baseline?: Pre-admission baseline Feeding: Needs assistance Is this a change from baseline?: Pre-admission baseline Bathing: Needs assistance Is this a change from baseline?: Pre-admission baseline Toileting: Needs assistance Is this a change from baseline?: Pre-admission baseline In/Out Bed: Needs assistance Is this a change from baseline?: Pre-admission baseline Walks in Home: Needs assistance Is this a change from baseline?: Pre-admission baseline Does the patient have difficulty walking or climbing stairs?: Yes Weakness of Legs: Both Weakness of Arms/Hands: Both  Permission Sought/Granted Permission sought to share information with : Case Manager Permission granted to share information with : Yes, Verbal Permission Granted  Share Information with NAME: [place] granted to share info w AGENCY: [place] granted to share info w Relationship: son  Permission granted to share info w Contact Information: [phone]  Emotional Assessment Appearance:: Appears stated age Attitude/Demeanor/Rapport: Engaged Affect (typically observed): Accepting Orientation: : Oriented to Self, Oriented to Place, Oriented to Situation, Oriented to  Time Alcohol / Substance Use: Never Used Psych Involvement: No (comment)  Admission diagnosis:  Syncope and collapse [R55] Patient Active Problem List  Diagnosis Date Noted  . AKI (acute kidney injury)  ([place]) 11/26/2018  . Rectus sheath hematoma 12/21/2017  . Altered mental status 12/16/2017  . Diarrhea 09/19/2017  . Constipation 09/12/2017  . Alzheimer disease ([place]) 07/25/2017  . Neck pain on left side 07/25/2017  . Urinary tract infection, recurrent 06/12/2017  . Thrush 02/10/2017  . Frequent urination 02/10/2017  . Osteoporosis 11/23/2016  . Abdominal pain 08/16/2016  . Urinary frequency 08/16/2016  . Peripheral edema 08/16/2016  . General weakness 08/16/2016  . Gait disorder 08/16/2016  . Dysuria 08/03/2016  . Closed displaced simple supracondylar fracture of right humerus without intercondylar fracture 05/10/2016  . Urinary tract infection without hematuria 05/10/2016  . Syncope 05/10/2016  . Humeral head fracture, right, closed, initial encounter   . Burning tongue 03/25/2016  . Post concussion syndrome 03/03/2016  . Fall 03/03/2016  . Cystocele with prolapse 03/05/2015  . Dehydration 02/08/2015  . Hyponatremia 02/08/2015  . Well adult exam 10/01/2014  . Grief at loss of child 12/11/2013  . Irregular heart beat 09/29/2013  . Humeral head fracture 08/26/2013  . Actinic keratoses 08/26/2013  . Carotid bruit 05/02/2012  . Anxiety 03/17/2011  . ANEMIA, PERNICIOUS, HX OF 10/10/2007  . Peripheral neuropathy ([place]) 09/04/2007  . Chronic fatigue 08/06/2007  . Chronic lymphocytic leukemia ([place]) 06/17/2007  . Allergic rhinitis 06/17/2007  . OSTEOARTHRITIS 06/17/2007  . B12 deficiency 06/07/2007  . GERD (gastroesophageal reflux disease) 04/30/2007  . Dyslipidemia 12/19/2006  . Essential hypertension 12/19/2006  . DIVERTICULOSIS, COLON 05/19/2003  . COLONIC POLYPS, HX OF 01/10/2000   PCP:  Cassandria Anger, MD Pharmacy:   [id number] - [place], [place] [address] Phone: [phone] Fax: [phone]     Social Determinants of Health (SDOH) Interventions    Readmission Risk Interventions No flowsheet data  found.

## 2018-11-26 NOTE — Care Management Obs Status (Signed)
[facility] NOTIFICATION   Patient Details  Name: [name] MRN: [id number] Date of Birth: Apr 07, 1926   Medicare Observation Status Notification Given:  Yes    MahabirJuliann Pulse, RN 11/26/2018, 3:00 PM

## 2018-11-26 NOTE — Progress Notes (Signed)
  Echocardiogram 2D Echocardiogram has been performed.  Darlina Sicilian M 11/26/2018, 2:16 PM

## 2018-11-27 ENCOUNTER — Telehealth: Payer: Self-pay | Admitting: *Deleted

## 2018-11-27 LAB — BASIC METABOLIC PANEL
Anion gap: 7 (ref 5–15)
BUN: 25 mg/dL — ABNORMAL HIGH (ref 8–23)
CO2: 23 mmol/L (ref 22–32)
Calcium: 8.5 mg/dL — ABNORMAL LOW (ref 8.9–10.3)
Chloride: 110 mmol/L (ref 98–111)
Creatinine, Ser: 0.84 mg/dL (ref 0.44–1.00)
GFR calc Af Amer: >60 mL/min (ref >60)
GFR calc non Af Amer: >60 mL/min (ref >60)
Glucose, Bld: 88 mg/dL (ref 70–99)
Potassium: 4 mmol/L (ref 3.5–5.1)
Sodium: 140 mmol/L (ref 135–145)

## 2018-11-27 LAB — CBC
HCT: 37.7 % (ref 36.0–46.0)
Hemoglobin: 11.8 g/dL — ABNORMAL LOW (ref 12.0–15.0)
MCH: 30.2 pg (ref 26.0–34.0)
MCHC: 31.3 g/dL (ref 30.0–36.0)
MCV: 96.4 fL (ref 80.0–100.0)
Platelets: 135 10*3/uL — ABNORMAL LOW (ref 150–400)
RBC: 3.91 MIL/uL (ref 3.87–5.11)
RDW: 14.5 % (ref 11.5–15.5)
WBC: 5 10*3/uL (ref 4.0–10.5)
nRBC: 0 % (ref 0.0–0.2)

## 2018-11-27 LAB — URINALYSIS, ROUTINE W REFLEX MICROSCOPIC
Bilirubin Urine: NEGATIVE
Glucose, UA: NEGATIVE mg/dL
Ketones, ur: NEGATIVE mg/dL
Nitrite: NEGATIVE
Protein, ur: NEGATIVE mg/dL
Specific Gravity, Urine: 1.013 (ref 1.005–1.030)
WBC, UA: >50 WBC/hpf — ABNORMAL HIGH (ref 0–5)
pH: 5 (ref 5.0–8.0)

## 2018-11-27 LAB — GLUCOSE, CAPILLARY
Glucose-Capillary: 76 mg/dL (ref 70–99)
Glucose-Capillary: 95 mg/dL (ref 70–99)

## 2018-11-27 MED ORDER — NITROFURANTOIN MONOHYD MACRO 100 MG PO CAPS: 100.0000 mg | ORAL_CAPSULE | Freq: Two times a day (BID) | ORAL | 0 refills | Status: AC

## 2018-11-27 MED ORDER — POLYETHYLENE GLYCOL 3350 17 G PO PACK
17.0000 g | PACK | Freq: Every day | ORAL | Status: DC
  Administered 2018-11-27: 17 g via ORAL
  Filled 2018-11-27: qty 1

## 2018-11-27 MED ORDER — TRAZODONE HCL 50 MG PO TABS: 25.0000 mg | ORAL_TABLET | Freq: Every day | ORAL | 0 refills | Status: AC

## 2018-11-27 NOTE — Discharge Summary (Signed)
Physician Discharge Summary  [name]:[id number] DOB: [birth year] DOA: 11/25/2018  PCP: Cassandria Anger, MD  Admit date: 11/25/2018 Discharge date: 11/27/2018  Time spent: 45 minutes  Recommendations for Outpatient Follow-up:  1. Follow UC ordered  2. Drink 2 liters of water--Macrobid 3. avoid polypharmacy as OP 4. See Dr. Benay Spice on 5/12--kindly reviewed the area on her back for malignancy  Discharge Diagnoses:  Principal Problem:   Syncope Active Problems:   Chronic lymphocytic leukemia ([place])   B12 deficiency   Peripheral neuropathy (HCC)   Essential hypertension   GERD (gastroesophageal reflux disease)   AKI (acute kidney injury) ([place])   Discharge Condition: improved  Diet recommendation: hh  Filed Weights   11/26/18 0515 11/26/18 0619 11/27/18 0617  Weight: 72.1 kg 73 kg 74.9 kg    History of present illness:    56 WF originally from [place], hypogammaglobulinemia on monthly IVIG, GERD, recurrent UTIs, dementia    presented from [place] home on 11/25/2018 with reportedly with 30 seconds loss of consciousness without head injury at that time blood glucose was within normal limits.    Per ED documentation while playing bingo patient experienced "syncopal episode).  She was sitting in her chair suddenly became pale, not back in her chair and lost consciousness for approximately 30 seconds to a minute.  Staff denied any head injury her blood sugar at that time was 121. she was sent to the ED for further evaluation due to her reported heart history.  Hospital Course:  Syncope limited historian due to dementia.  Likely Dehydration Patient has no known heart history no significant medical history is CLL.  EKG, TTE Monitor on telemetry. on IVF.  Not on any contributing BP medications. Complained of intermittent dysuria, awaiting urinalysis, otherwise no localizing signs or symptoms. Pt eval   Mild AKI, likely related to dehydration Baseline creatinine  0.6-0.7. Slight improvement from 1.08 on admission to 1. GFR is 50 ( baseline > 60).  Need labs 1 week   Hypotension, improving, currently normotensive on IVF systolics as low as 81X overnight(6 pm, 63/40, 87/62, 94/78--likely some error, then improved to 111/58. Repeat orthostatic vitals negative.no meds   Recurrent UTI hx UA was equivocal-Urine Cult pending and will have to be followed at [facility] on macrobid   Dementia, stable.  No behavioral disturbances. Aricept on med rec but patient not taking. Delirium precautions    Discharge Exam: Vitals:   11/26/18 2218 11/27/18 0617  BP: 136/68 114/62  Pulse: 62 60  Resp: 20 16  Temp: 98.5 F (36.9 C) 98 F (36.7 C)  SpO2: 96% 93%    General: awake coherent to some degree--remembers that she is at a hospital and that she is from [place] but gets a little bit more confused as we discussed Cardiovascular: S1-S2 no murmur rub or gallop no rales no rhonchi sinus rhythm on telemetry Respiratory: Clinically clear no added sound She does have a splotchy on her back that looks irregular  Discharge Instructions   Discharge Instructions    Diet - low sodium heart healthy   Complete by:  As directed    Increase activity slowly   Complete by:  As directed      Allergies as of 11/27/2018      Reactions   Pneumococcal Vaccines Swelling, Rash   Pneumococcal Vaccine-23 valent   Clarithromycin Other (See Comments)   REACTION: sore mouth   Hctz [hydrochlorothiazide]    Low Na   Oxycodone-aspirin Other (  See Comments)   REACTION: horrible nightmares   Propoxyphene N-acetaminophen Other (See Comments)   REACTION: couldn't wake her up      Medication List    STOP taking these medications   B-D INS SYRINGE 0.5CC/30GX1/2" 30G X 1/2" 0.5 ML Misc Generic drug:  Insulin Syringe-Needle U-100   BD Insulin Syringe U/F 30G X 1/2" 0.5 ML Misc Generic drug:  Insulin Syringe-Needle U-100   Klor-Con M10 10 MEQ tablet Generic drug:   potassium chloride   potassium chloride 10 MEQ CR capsule Commonly known as:  MICRO-K   sucralfate 1 g tablet Commonly known as:  CARAFATE     TAKE these medications   acetaminophen 500 MG tablet Commonly known as:  TYLENOL Take 500 mg by mouth 2 (two) times daily.   Antacid 500 MG chewable tablet Generic drug:  calcium carbonate Chew 2 tablets by mouth every 4 (four) hours as needed for indigestion or heartburn.   CertaVite/Antioxidants Tabs Take 1 tablet by mouth daily.   cromolyn 4 % ophthalmic solution Commonly known as:  OPTICROM Place 1 drop into both eyes at bedtime.   cyanocobalamin 1000 MCG/ML injection Commonly known as:  (VITAMIN B-12) ADMINISTER 1 CC SUBQ EVERY 4 WEEKS AS DIRECTED What changed:  See the new instructions.   donepezil 5 MG tablet Commonly known as:  ARICEPT TAKE 1 TABLET BY MOUTH EVERYDAY AT BEDTIME   feeding supplement (ENSURE ENLIVE) Liqd Take 237 mLs by mouth 3 (three) times daily between meals.   fluticasone 50 MCG/ACT nasal spray Commonly known as:  FLONASE Place 2 sprays into both nostrils daily.   folic acid 1 MG tablet Commonly known as:  FOLVITE Take 1 mg by mouth daily. For restless legs   lansoprazole 30 MG capsule Commonly known as:  PREVACID Take 30 mg by mouth daily at 12 noon.   latanoprost 0.005 % ophthalmic solution Commonly known as:  XALATAN PLACE 1 DROP INTO BOTH EYES AT BEDTIME.   loratadine 10 MG tablet Commonly known as:  CLARITIN TAKE 1 TABLET (10 MG TOTAL) BY MOUTH DAILY. What changed:  See the new instructions.   Memantine HCl-Donepezil HCl 28-10 MG Cp24 Take 1 capsule by mouth daily.   Mi-Acid 200-200-20 MG/5ML suspension Generic drug:  alum & mag hydroxide-simeth Take 30 mLs by mouth every 4 (four) hours as needed for indigestion or heartburn.   mirabegron ER 50 MG Tb24 tablet Commonly known as:  Myrbetriq Take 1 tablet (50 mg total) by mouth daily.   nitrofurantoin (macrocrystal-monohydrate)  100 MG capsule Commonly known as:  Macrobid Take 1 capsule (100 mg total) by mouth 2 (two) times daily for 3 days.   phenol 1.4 % Liqd Commonly known as:  CHLORASEPTIC Use as directed 5 sprays in the mouth or throat every 6 (six) hours as needed for throat irritation / pain.   polyethylene glycol 17 g packet Commonly known as:  MIRALAX / GLYCOLAX Take 17 g by mouth daily. What changed:    when to take this  reasons to take this   PRESCRIPTION MEDICATION Take 1 Dose by mouth 3 (three) times daily as needed (mouth pain). Dukes Magic Mouthwash: Swish, hold, & spit   PROBIOTIC PO Take 1 capsule by mouth daily.   senna-docusate 8.6-50 MG tablet Commonly known as:  Senokot-S Take 2 tablets by mouth 2 (two) times daily.   traZODone 50 MG tablet Commonly known as:  DESYREL Take 0.5 tablets (25 mg total) by mouth at bedtime.   triamcinolone  ointment 0.1 % Commonly known as:  KENALOG Apply 1 application topically 2 (two) times daily.   Vitamin D3 50 MCG (2000 UT) Tabs Take 50 mcg by mouth daily.   Vitamin D3 50 MCG (2000 UT) capsule Take 1 capsule (2,000 Units total) by mouth daily.      Allergies  Allergen Reactions  . Pneumococcal Vaccines Swelling and Rash    Pneumococcal Vaccine-23 valent  . Clarithromycin Other (See Comments)    REACTION: sore mouth  . Hctz [Hydrochlorothiazide]     Low Na  . Oxycodone-Aspirin Other (See Comments)    REACTION: horrible nightmares  . Propoxyphene N-Acetaminophen Other (See Comments)    REACTION: couldn't wake her up      The results of significant diagnostics from this hospitalization (including imaging, microbiology, ancillary and laboratory) are listed below for reference.    Significant Diagnostic Studies: No results found.  Microbiology: Recent Results (from the past 240 hour(s))  SARS Coronavirus 2 (CEPHEID - Performed in [facility] hospital lab), Hosp Order     Status: None   Collection Time: 11/25/18  4:58 PM   Result Value Ref Range Status   SARS Coronavirus 2 NEGATIVE NEGATIVE Final    Comment: (NOTE) If result is NEGATIVE SARS-CoV-2 target nucleic acids are NOT DETECTED. The SARS-CoV-2 RNA is generally detectable in upper and lower  respiratory specimens during the acute phase of infection. The lowest  concentration of SARS-CoV-2 viral copies this assay can detect is 250  copies / mL. A negative result does not preclude SARS-CoV-2 infection  and should not be used as the sole basis for treatment or other  patient management decisions.  A negative result may occur with  improper specimen collection / handling, submission of specimen other  than nasopharyngeal swab, presence of viral mutation(s) within the  areas targeted by this assay, and inadequate number of viral copies  (<250 copies / mL). A negative result must be combined with clinical  observations, patient history, and epidemiological information. If result is POSITIVE SARS-CoV-2 target nucleic acids are DETECTED. The SARS-CoV-2 RNA is generally detectable in upper and lower  respiratory specimens dur ing the acute phase of infection.  Positive  results are indicative of active infection with SARS-CoV-2.  Clinical  correlation with patient history and other diagnostic information is  necessary to determine patient infection status.  Positive results do  not rule out bacterial infection or co-infection with other viruses. If result is PRESUMPTIVE POSTIVE SARS-CoV-2 nucleic acids MAY BE PRESENT.   A presumptive positive result was obtained on the submitted specimen  and confirmed on repeat testing.  While 2019 novel coronavirus  (SARS-CoV-2) nucleic acids may be present in the submitted sample  additional confirmatory testing may be necessary for epidemiological  and / or clinical management purposes  to differentiate between  SARS-CoV-2 and other Sarbecovirus currently known to infect humans.  If clinically indicated additional  testing with an alternate test  methodology [phone]) is advised. The SARS-CoV-2 RNA is generally  detectable in upper and lower respiratory sp ecimens during the acute  phase of infection. The expected result is Negative. Fact Sheet for Patients:  StrictlyIdeas.no Fact Sheet for Healthcare Providers: BankingDealers.co.za This test is not yet approved or cleared by the [place] FDA and has been authorized for detection and/or diagnosis of SARS-CoV-2 by FDA under an Emergency Use Authorization (EUA).  This EUA will remain in effect (meaning this test can be used) for the duration of the COVID-19 declaration under Section  564(b)(1) of the Act, 21 U.S.C. section 360bbb-3(b)(1), unless the authorization is terminated or revoked sooner. Performed at [facility], [place] [address], [place], [place] [address]      Labs: Basic Metabolic Panel: Recent Labs  Lab 11/25/18 1311 11/26/18 0549 11/27/18 0526  NA 139 139 140  K 3.7 3.2* 4.0  CL 104 105 110  CO2 26 25 23   GLUCOSE 111* 77 88  BUN 21 25* 25*  CREATININE 1.08* 1.00 0.84  CALCIUM 9.2 8.8* 8.5*  MG  --  2.1  --    Liver Function Tests: Recent Labs  Lab 11/26/18 0549  AST 19  ALT 18  ALKPHOS 41  BILITOT 0.6  PROT 6.3*  ALBUMIN 3.3*   No results for input(s): LIPASE, AMYLASE in the last 168 hours. No results for input(s): AMMONIA in the last 168 hours. CBC: Recent Labs  Lab 11/25/18 1311 11/26/18 0549 11/27/18 0526  WBC 6.5 5.2 5.0  NEUTROABS 4.4  --   --   HGB 13.9 12.9 11.8*  HCT 43.3 41.0 37.7  MCV 95.4 95.8 96.4  PLT 147* 144* 135*   Cardiac Enzymes: No results for input(s): CKTOTAL, CKMB, CKMBINDEX, TROPONINI in the last 168 hours. BNP: BNP (last 3 results) No results for input(s): BNP in the last 8760 hours.  ProBNP (last 3 results) No results for input(s): PROBNP in the last 8760 hours.  CBG: Recent Labs  Lab 11/26/18 0742  11/26/18 1141 11/26/18 1643 11/26/18 2221 11/27/18 0817  GLUCAP 80 90 99 102* 76       Signed:  Nita Sells MD   [facility] 11/27/2018, 11:06 AM

## 2018-11-27 NOTE — NC FL2 (Signed)
[place] MEDICAID FL2 LEVEL OF CARE SCREENING TOOL     IDENTIFICATION  Patient Name: [name] Birthdate: June 06, 1926 Sex: female Admission Date (Current Location): 11/25/2018  [facility] and [place] Number:  Herbalist and Address:  [facility],  [place] [address], [place]      Provider Number: [id number]  Attending Physician Name and Address:  Nita Sells, MD  Relative Name and Phone Number:  Loveda Colaizzi [phone]    Current Level of Care: Hospital Recommended Level of Care: [place] Prior Approval Number:    Date Approved/Denied:   PASRR Number: [id number]  Discharge Plan: Other (Comment)(ALF)    Current Diagnoses: Patient Active Problem List   Diagnosis Date Noted  . AKI (acute kidney injury) ([place]) 11/26/2018  . Rectus sheath hematoma 12/21/2017  . Altered mental status 12/16/2017  . Diarrhea 09/19/2017  . Constipation 09/12/2017  . Alzheimer disease ([place]) 07/25/2017  . Neck pain on left side 07/25/2017  . Urinary tract infection, recurrent 06/12/2017  . Thrush 02/10/2017  . Frequent urination 02/10/2017  . Osteoporosis 11/23/2016  . Abdominal pain 08/16/2016  . Urinary frequency 08/16/2016  . Peripheral edema 08/16/2016  . General weakness 08/16/2016  . Gait disorder 08/16/2016  . Dysuria 08/03/2016  . Closed displaced simple supracondylar fracture of right humerus without intercondylar fracture 05/10/2016  . Urinary tract infection without hematuria 05/10/2016  . Syncope 05/10/2016  . Humeral head fracture, right, closed, initial encounter   . Burning tongue 03/25/2016  . Post concussion syndrome 03/03/2016  . Fall 03/03/2016  . Cystocele with prolapse 03/05/2015  . Dehydration 02/08/2015  . Hyponatremia 02/08/2015  . Well adult exam 10/01/2014  . Grief at loss of child 12/11/2013  . Irregular heart beat 09/29/2013  . Humeral head fracture 08/26/2013  . Actinic keratoses 08/26/2013  .  Carotid bruit 05/02/2012  . Anxiety 03/17/2011  . ANEMIA, PERNICIOUS, HX OF 10/10/2007  . Peripheral neuropathy ([place]) 09/04/2007  . Chronic fatigue 08/06/2007  . Chronic lymphocytic leukemia ([place]) 06/17/2007  . Allergic rhinitis 06/17/2007  . OSTEOARTHRITIS 06/17/2007  . B12 deficiency 06/07/2007  . GERD (gastroesophageal reflux disease) 04/30/2007  . Dyslipidemia 12/19/2006  . Essential hypertension 12/19/2006  . DIVERTICULOSIS, COLON 05/19/2003  . COLONIC POLYPS, HX OF 01/10/2000    Orientation RESPIRATION BLADDER Height & Weight     Self, Situation, Place  Normal External catheter, Continent Weight: 74.9 kg Height:  5\' 6"  (167.6 cm)  BEHAVIORAL SYMPTOMS/MOOD NEUROLOGICAL BOWEL NUTRITION STATUS      Continent Diet(Regular)  AMBULATORY STATUS COMMUNICATION OF NEEDS Skin   Limited Assist Verbally Normal                       Personal Care Assistance Level of Assistance  Bathing, Feeding, Dressing Bathing Assistance: Limited assistance Feeding assistance: Limited assistance Dressing Assistance: Limited assistance     Functional Limitations Info             SPECIAL CARE FACTORS FREQUENCY  PT (By licensed PT), OT (By licensed OT)     PT Frequency: 5x week OT Frequency: 5x week            Contractures Contractures Info: Not present    Additional Factors Info  Code Status, Allergies(CBG montoring routine) Code Status Info: DNR Allergies Info: Pneumococcal Vaccines, Clarithromycin, Hctz Hydrochlorothiazide, Oxycodone-aspirin, Propoxyphene N-acetaminophen           Current Medications (11/27/2018):  This is the current hospital active  medication list Current Facility-Administered Medications  Medication Dose Route Frequency Provider Last Rate Last Dose  . 0.9 %  sodium chloride infusion   Intravenous Continuous Nita Sells, MD 40 mL/hr at 11/27/18 0936    . acetaminophen (TYLENOL) tablet 650 mg  650 mg Oral Q6H PRN Amin, Ankit Chirag, MD        Or  . acetaminophen (TYLENOL) suppository 650 mg  650 mg Rectal Q6H PRN Amin, Ankit Chirag, MD      . alum & mag hydroxide-simeth (MAALOX/MYLANTA) 200-200-20 MG/5ML suspension 30 mL  30 mL Oral Q4H PRN Oretha Milch D, MD   30 mL at 11/26/18 2213  . bisacodyl (DULCOLAX) EC tablet 5 mg  5 mg Oral Daily PRN Amin, Ankit Chirag, MD      . memantine (NAMENDA XR) 24 hr capsule 28 mg  28 mg Oral QHS Amin, Ankit Chirag, MD   28 mg at 11/26/18 2213   And  . donepezil (ARICEPT) tablet 10 mg  10 mg Oral QHS Amin, Ankit Chirag, MD   10 mg at 11/26/18 2018  . enoxaparin (LOVENOX) injection 40 mg  40 mg Subcutaneous Q24H Amin, Ankit Chirag, MD   40 mg at 11/26/18 2018  . folic acid (FOLVITE) tablet 1 mg  1 mg Oral Daily Amin, Ankit Chirag, MD   1 mg at 11/26/18 1035  . insulin aspart (novoLOG) injection 0-9 Units  0-9 Units Subcutaneous TID WC Amin, Ankit Chirag, MD      . mirabegron ER (MYRBETRIQ) tablet 50 mg  50 mg Oral Daily Amin, Ankit Chirag, MD   50 mg at 11/26/18 1032  . oxyCODONE (Oxy IR/ROXICODONE) immediate release tablet 5 mg  5 mg Oral Q4H PRN Amin, Ankit Chirag, MD      . pantoprazole (PROTONIX) EC tablet 40 mg  40 mg Oral Daily Amin, Ankit Chirag, MD   40 mg at 11/26/18 1032  . polyethylene glycol (MIRALAX / GLYCOLAX) packet 17 g  17 g Oral Daily Samtani, Jai-Gurmukh, MD      . senna-docusate (Senokot-S) tablet 1 tablet  1 tablet Oral QHS PRN Amin, Ankit Chirag, MD      . traZODone (DESYREL) tablet 25 mg  25 mg Oral QHS Amin, Ankit Chirag, MD   25 mg at 11/26/18 2018     Discharge Medications: Please see discharge summary for a list of discharge medications.  Relevant Imaging Results:  Relevant Lab Results:   Additional Information [id number]  Dessa Phi, RN

## 2018-11-27 NOTE — Evaluation (Signed)
Physical Therapy Evaluation Patient Details Name: [name] MRN: [id number] DOB: 12-27-1925 Today's Date: 11/27/2018   History of Present Illness  Pt is a 85 year old female with hx of CLL, hypogammaglobulinemia on monthly IVIG, GERD, recurrent UTIs, dementia, HTN and admitted for syncope episode at her ALF.  Clinical Impression  Pt admitted with above diagnosis. Pt currently with functional limitations due to the deficits listed below (see PT Problem List).  Pt will benefit from skilled PT to increase their independence and safety with mobility to allow discharge to the venue listed below.  Pt pleasant and agreeable to ambulate. Pt only tolerated limited distance due to generalized weakness and fatigue.  Pt prefers to d/c back to her ALF.  Recommend SNF if ALF unable to assist pt, however if back to ALF, pt would benefit from HHPT.     Follow Up Recommendations Supervision/Assistance - 24 hour;SNF(pt prefers back to ALF if ALF able to provide assist)    Equipment Recommendations  None recommended by PT    Recommendations for Other Services       Precautions / Restrictions Precautions Precautions: Fall Restrictions Weight Bearing Restrictions: No      Mobility  Bed Mobility Overal bed mobility: Needs Assistance Bed Mobility: Supine to Sit;Sit to Supine     Supine to sit: Min guard;HOB elevated Sit to supine: Min guard   General bed mobility comments: increased time and effort  Transfers Overall transfer level: Needs assistance Equipment used: Rolling walker (2 wheeled) Transfers: Sit to/from Stand Sit to Stand: Min assist         General transfer comment: solid min assist to rise and steady, cues for hand placement  Ambulation/Gait Ambulation/Gait assistance: Min guard Gait Distance (Feet): 40 Feet Assistive device: Rolling walker (2 wheeled) Gait Pattern/deviations: Step-through pattern;Decreased stride length;Trunk flexed Gait velocity: decr   General  Gait Details: verbal cues for RW positioning, pt reports distance limited due to leg weakness and fatigue  Stairs            Wheelchair Mobility    Modified Rankin (Stroke Patients Only)       Balance Overall balance assessment: Needs assistance         Standing balance support: Bilateral upper extremity supported Standing balance-Leahy Scale: Poor Standing balance comment: requires UE support                             Pertinent Vitals/Pain Pain Assessment: Faces Faces Pain Scale: Hurts little more Pain Location: "tail bone"  chronic for fracture about a year ago Pain Descriptors / Indicators: Tender Pain Intervention(s): Limited activity within patient's tolerance;Monitored during session;Repositioned    Home Living Family/patient expects to be discharged to:: Assisted living               Home Equipment: Walker - 2 wheels;Walker - 4 wheels;Cane - single point;Shower seat - built in;Bedside commode      Prior Function Level of Independence: Independent with assistive device(s)         Comments: walks with rollator, goes to dining hall     Hand Dominance        Extremity/Trunk Assessment        Lower Extremity Assessment Lower Extremity Assessment: Generalized weakness       Communication   Communication: No difficulties  Cognition Arousal/Alertness: Awake/alert Behavior During Therapy: WFL for tasks assessed/performed Overall Cognitive Status: History of cognitive impairments - at baseline  General Comments: appropriate, follows commands, pleasant      General Comments      Exercises     Assessment/Plan    PT Assessment Patient needs continued PT services  PT Problem List Decreased strength;Decreased mobility;Decreased activity tolerance;Decreased balance;Decreased knowledge of use of DME       PT Treatment Interventions DME instruction;Functional mobility  training;Balance training;Gait training;Therapeutic activities;Stair training;Therapeutic exercise;Patient/family education    PT Goals (Current goals can be found in the Care Plan section)  Acute Rehab PT Goals PT Goal Formulation: With patient Time For Goal Achievement: 12/04/18 Potential to Achieve Goals: Good    Frequency Min 3X/week   Barriers to discharge        Co-evaluation               AM-PAC PT "6 Clicks" Mobility  Outcome Measure Help needed turning from your back to your side while in a flat bed without using bedrails?: A Little Help needed moving from lying on your back to sitting on the side of a flat bed without using bedrails?: A Little Help needed moving to and from a bed to a chair (including a wheelchair)?: A Little Help needed standing up from a chair using your arms (e.g., wheelchair or bedside chair)?: A Little Help needed to walk in hospital room?: A Little Help needed climbing 3-5 steps with a railing? : A Little 6 Click Score: 18    End of Session Equipment Utilized During Treatment: Gait belt Activity Tolerance: Patient limited by fatigue Patient left: in bed;with call bell/phone within reach;with bed alarm set Nurse Communication: Mobility status PT Visit Diagnosis: Other abnormalities of gait and mobility (R26.89);Muscle weakness (generalized) (M62.81)    Time: 0454-0981 PT Time Calculation (min) (ACUTE ONLY): 11 min   Charges:   PT Evaluation $PT Eval Low Complexity: Iatan, PT, DPT Acute Rehabilitation Services Office: [phone] Pager: [phone]  Trena Platt 11/27/2018, 12:35 PM

## 2018-11-27 NOTE — Telephone Encounter (Signed)
Pt was on TCM report admitted 11/25/18 for syncope. Pt was brought in by [facility] reportedly with 30 seconds loss of consciousness without head injury at that time blood glucose was within normal limits.Likely Dehydration Patient has no known heart history no significant medical history is CLL. EKG, TTE Monitor on telemetry.on IVF. Not on any contributing BP medications. Complained of intermittent dysuria, awaiting urinalysis, otherwise nolocalizing signs or symptoms.Pt D/c 11/27/18 back to [facility] nursing and will follow-up w/their MD,,/lmb

## 2018-11-27 NOTE — TOC Transition Note (Signed)
Transition of Care [facility]) - CM/SW Discharge Note   Patient Details  Name: [name] MRN: [id number] Date of Birth: 05/03/1926  Transition of Care [facility]) CM/SW Contact:  Dessa Phi, RN Phone Number: 11/27/2018, 11:35 AM   Clinical Narrative:spoke to [facility] rep Joy Bishop-Nsg administrator-aware of patient's return back to ALF-they will provide own PT. Son ronald called & aware of return back to ALF.Faxed with confirmation d/c summary. [facility] called. Nurse provided w/[facility] nurse report tel#. No further CM needs.     Final next level of care: Assisted Living Barriers to Discharge: No Barriers Identified   Patient Goals and CMS Choice Patient states their goals for this hospitalization and ongoing recovery are:: return back to ALF      Discharge Placement                       Discharge Plan and Services   Discharge Planning Services: CM Consult Post Acute Care Choice: Home Health(ALF has own PT-[facility])                               Social Determinants of Health (SDOH) Interventions     Readmission Risk Interventions No flowsheet data found.

## 2018-11-29 LAB — URINE CULTURE: Culture: >=100000 — AB

## 2018-12-03 ENCOUNTER — Inpatient Hospital Stay: Payer: Medicare Other

## 2018-12-03 ENCOUNTER — Telehealth: Payer: Self-pay | Admitting: *Deleted

## 2018-12-03 NOTE — Telephone Encounter (Signed)
Patient missed her IVIG today. Still at [facility]. They will allow her to come for tx and return there if MD feels it is important to receive the IVIG now during covid precautions there. Per Dr. Benay Spice, she can easily wait until 12/31/18 at 10:00. Joy will have the appointment placed on facility calendar and notify her son.

## 2018-12-31 ENCOUNTER — Inpatient Hospital Stay: Payer: Medicare Other | Attending: Oncology

## 2018-12-31 ENCOUNTER — Other Ambulatory Visit: Payer: Self-pay

## 2018-12-31 VITALS — BP 134/72 | HR 71 | Temp 98.5°F | Resp 16

## 2018-12-31 DIAGNOSIS — Z79899 Other long term (current) drug therapy: Secondary | ICD-10-CM | POA: Diagnosis not present

## 2018-12-31 DIAGNOSIS — Z95828 Presence of other vascular implants and grafts: Secondary | ICD-10-CM

## 2018-12-31 DIAGNOSIS — D801 Nonfamilial hypogammaglobulinemia: Secondary | ICD-10-CM | POA: Diagnosis not present

## 2018-12-31 DIAGNOSIS — C9110 Chronic lymphocytic leukemia of B-cell type not having achieved remission: Secondary | ICD-10-CM

## 2018-12-31 MED ORDER — DIPHENHYDRAMINE HCL 25 MG PO CAPS
ORAL_CAPSULE | ORAL | Status: AC
  Filled 2018-12-31: qty 1

## 2018-12-31 MED ORDER — DIPHENHYDRAMINE HCL 25 MG PO CAPS
25.0000 mg | ORAL_CAPSULE | Freq: Once | ORAL | Status: AC
  Administered 2018-12-31: 25 mg via ORAL

## 2018-12-31 MED ORDER — IMMUNE GLOBULIN (HUMAN) 10 GM/100ML IV SOLN
30.0000 g | Freq: Once | INTRAVENOUS | Status: AC
  Administered 2018-12-31: 30 g via INTRAVENOUS
  Filled 2018-12-31: qty 200

## 2018-12-31 MED ORDER — DEXTROSE 5 % IV SOLN
Freq: Once | INTRAVENOUS | Status: AC
  Administered 2018-12-31: 11:00:00 via INTRAVENOUS
  Filled 2018-12-31: qty 250

## 2018-12-31 NOTE — Patient Instructions (Signed)

## 2019-02-04 ENCOUNTER — Inpatient Hospital Stay: Payer: Medicare Other

## 2019-02-04 ENCOUNTER — Inpatient Hospital Stay: Payer: Medicare Other | Admitting: Oncology

## 2019-02-04 ENCOUNTER — Telehealth: Payer: Self-pay | Admitting: *Deleted

## 2019-02-04 NOTE — Telephone Encounter (Signed)
Call to [facility] and spoke with Electrical engineer, Lennette Bihari. They were not aware of her appointment today. Scheduling message sent to reschedule and call [facility] with date/time.

## 2019-02-06 ENCOUNTER — Telehealth: Payer: Self-pay | Admitting: Oncology

## 2019-02-06 NOTE — Telephone Encounter (Signed)
Scheduled appt per 7/14 sch message - scheduled appt and left message for [place] with transportation with [facility]

## 2019-02-10 ENCOUNTER — Inpatient Hospital Stay: Payer: Medicare Other

## 2019-02-10 ENCOUNTER — Other Ambulatory Visit: Payer: Self-pay

## 2019-02-10 ENCOUNTER — Other Ambulatory Visit: Payer: Medicare Other

## 2019-02-10 ENCOUNTER — Inpatient Hospital Stay: Payer: Medicare Other | Attending: Oncology | Admitting: Oncology

## 2019-02-10 VITALS — BP 136/84 | HR 80 | Temp 98.7°F | Resp 18 | Ht 66.0 in | Wt 155.3 lb

## 2019-02-10 VITALS — BP 191/120 | HR 90 | Temp 97.9°F | Resp 20

## 2019-02-10 DIAGNOSIS — R05 Cough: Secondary | ICD-10-CM | POA: Insufficient documentation

## 2019-02-10 DIAGNOSIS — Z87440 Personal history of urinary (tract) infections: Secondary | ICD-10-CM | POA: Insufficient documentation

## 2019-02-10 DIAGNOSIS — Z79899 Other long term (current) drug therapy: Secondary | ICD-10-CM | POA: Diagnosis not present

## 2019-02-10 DIAGNOSIS — C9110 Chronic lymphocytic leukemia of B-cell type not having achieved remission: Secondary | ICD-10-CM | POA: Insufficient documentation

## 2019-02-10 DIAGNOSIS — R918 Other nonspecific abnormal finding of lung field: Secondary | ICD-10-CM | POA: Insufficient documentation

## 2019-02-10 DIAGNOSIS — D6959 Other secondary thrombocytopenia: Secondary | ICD-10-CM | POA: Insufficient documentation

## 2019-02-10 DIAGNOSIS — D801 Nonfamilial hypogammaglobulinemia: Secondary | ICD-10-CM

## 2019-02-10 DIAGNOSIS — K146 Glossodynia: Secondary | ICD-10-CM | POA: Insufficient documentation

## 2019-02-10 MED ORDER — DIPHENHYDRAMINE HCL 25 MG PO CAPS
25.0000 mg | ORAL_CAPSULE | Freq: Once | ORAL | Status: AC
  Administered 2019-02-10: 25 mg via ORAL

## 2019-02-10 MED ORDER — IMMUNE GLOBULIN (HUMAN) 10 GM/100ML IV SOLN
30.0000 g | Freq: Once | INTRAVENOUS | Status: AC
  Administered 2019-02-10: 12:00:00 30 g via INTRAVENOUS
  Filled 2019-02-10: qty 200

## 2019-02-10 MED ORDER — DIPHENHYDRAMINE HCL 25 MG PO CAPS
ORAL_CAPSULE | ORAL | Status: AC
  Filled 2019-02-10: qty 1

## 2019-02-10 MED ORDER — DEXTROSE 5 % IV SOLN
Freq: Once | INTRAVENOUS | Status: AC
  Administered 2019-02-10: 11:00:00 via INTRAVENOUS
  Filled 2019-02-10: qty 250

## 2019-02-10 NOTE — Progress Notes (Signed)
Per Dr. Benay Spice: Labs not necessary this treatment. Will check labs at next appointment

## 2019-02-10 NOTE — Patient Instructions (Signed)

## 2019-02-10 NOTE — Addendum Note (Signed)
Addended by: Tania Ade on: 02/10/2019 10:58 AM   Modules accepted: Orders

## 2019-02-10 NOTE — Progress Notes (Signed)
  [facility] OFFICE PROGRESS NOTE   Diagnosis: CLL  INTERVAL HISTORY:   [name] returns as scheduled.  She last received IVIG on 12/31/2018.  She is now living at [facility] facility.  No recent infection.  Good appetite.  She reports a cough for the past 2 days.  She complains of a sore tongue for several months.  She relates this to reflux.  Objective:  Vital signs in last 24 hours:  Blood pressure 136/84, pulse 80, temperature 98.7 F (37.1 C), temperature source Oral, resp. rate 18, height 5\' 6"  (1.676 m), weight 155 lb 4.8 oz (70.4 kg), SpO2 93 %.    HEENT: No thrush or ulcers Resp: Coarse end inspiratory rhonchi at the posterior chest bilaterally, no respiratory distress Cardio: Regular rate and rhythm Vascular: Trace edema at the right ankle   Lab Results:  Lab Results  Component Value Date   WBC 5.0 11/27/2018   HGB 11.8 (L) 11/27/2018   HCT 37.7 11/27/2018   MCV 96.4 11/27/2018   PLT 135 (L) 11/27/2018   NEUTROABS 4.4 11/25/2018    CMP  Lab Results  Component Value Date   NA 140 11/27/2018   K 4.0 11/27/2018   CL 110 11/27/2018   CO2 23 11/27/2018   GLUCOSE 88 11/27/2018   BUN 25 (H) 11/27/2018   CREATININE 0.84 11/27/2018   CALCIUM 8.5 (L) 11/27/2018   PROT 6.3 (L) 11/26/2018   ALBUMIN 3.3 (L) 11/26/2018   AST 19 11/26/2018   ALT 18 11/26/2018   ALKPHOS 41 11/26/2018   BILITOT 0.6 11/26/2018   GFRNONAA >60 11/27/2018   GFRAA >60 11/27/2018     Medications: I have reviewed the patient's current medications.   Assessment/Plan: 1. Chronic lymphocytic leukemia, asymptomatic. 2. History of hypogammaglobulinemia with recurrent infections. She continues monthly IVIG replacement therapy. 3. History of recurrent urinary tract infections. 4. History of mild thrombocytopenia secondary to CLL. 5. Question right upper lobe nodule on chest x-ray 05/09/2016; follow-up chest x-ray 09/19/2016 with  more confluent irregular nodular density right upper lobe measuring 3 x 1.8 cm. repeat chest x-ray 01/09/2017-regression of irregular nodular density in the right upper lobe felt to represent interval scarring. Stable on chest x-ray 05/01/2017 6.Admission 12/21/2017 with a right rectus hematoma, felt to be related to Lovenox received during the 12/16/2017 hospital admission    Disposition: [name] appears unchanged.  She would like to continue monthly IVIG therapy for infection prophylaxis.  She will return for an office visit and CBC in 3 months. I cannot appreciate an abnormality on examination of the tongue today.  She will continue follow-up with [name].  Melissa Coder, MD  02/10/2019  9:56 AM

## 2019-02-11 ENCOUNTER — Telehealth: Payer: Self-pay | Admitting: Oncology

## 2019-02-11 NOTE — Telephone Encounter (Signed)
Called 2 different numbers, no answer and no voicemail. Mailed printout

## 2019-03-05 ENCOUNTER — Other Ambulatory Visit: Payer: Self-pay | Admitting: Internal Medicine

## 2019-03-05 DIAGNOSIS — J90 Pleural effusion, not elsewhere classified: Secondary | ICD-10-CM

## 2019-03-06 ENCOUNTER — Other Ambulatory Visit: Payer: Self-pay | Admitting: *Deleted

## 2019-03-06 DIAGNOSIS — C9110 Chronic lymphocytic leukemia of B-cell type not having achieved remission: Secondary | ICD-10-CM

## 2019-03-07 ENCOUNTER — Telehealth: Payer: Self-pay | Admitting: Oncology

## 2019-03-07 NOTE — Telephone Encounter (Signed)
Returned call to [facility] re rescheduling 8/17 appointment. Spoke with Leilani Able and per Leilani Able, it was probably Lennette Bihari who called and she would have him call me back. Explained to [facility] appointment is for infusion and if possible patient should keep appointment. Per Leilani Able he probably was trying reschedule due to covid, however she will tell Lennette Bihari to keep appointment. Toki given my direct number.

## 2019-03-10 ENCOUNTER — Inpatient Hospital Stay: Payer: Medicare Other

## 2019-03-10 ENCOUNTER — Telehealth: Payer: Self-pay

## 2019-03-10 NOTE — Telephone Encounter (Signed)
Spoke with Receptionist at [facility], NO nurse available after holding for 5 minutes I was told. I asked to get a message to the nurse - if it was Leilani Able again or Lennette Bihari in transport too- Patient was not brought in today, even though MD Benay Spice called there last week and they said they would keep her appointments.  I am going to cancel now and instructed them to call back to reschedule please.

## 2019-03-14 ENCOUNTER — Inpatient Hospital Stay (HOSPITAL_COMMUNITY)
Admission: EM | Admit: 2019-03-14 | Discharge: 2019-03-19 | DRG: 840 | Disposition: A | Discharge status: Skilled Nursing Facility | Payer: Medicare Other | Source: Skilled Nursing Facility | Attending: Family Medicine | Admitting: Family Medicine

## 2019-03-14 ENCOUNTER — Emergency Department (HOSPITAL_COMMUNITY): Payer: Medicare Other

## 2019-03-14 ENCOUNTER — Other Ambulatory Visit: Payer: Medicare Other

## 2019-03-14 ENCOUNTER — Encounter (HOSPITAL_COMMUNITY): Payer: Self-pay

## 2019-03-14 ENCOUNTER — Inpatient Hospital Stay (HOSPITAL_COMMUNITY): Payer: Medicare Other

## 2019-03-14 ENCOUNTER — Other Ambulatory Visit: Payer: Self-pay

## 2019-03-14 DIAGNOSIS — Z6823 Body mass index (BMI) 23.0-23.9, adult: Secondary | ICD-10-CM | POA: Diagnosis not present

## 2019-03-14 DIAGNOSIS — Z9889 Other specified postprocedural states: Secondary | ICD-10-CM

## 2019-03-14 DIAGNOSIS — I340 Nonrheumatic mitral (valve) insufficiency: Secondary | ICD-10-CM | POA: Diagnosis not present

## 2019-03-14 DIAGNOSIS — R000 Tachycardia, unspecified: Secondary | ICD-10-CM | POA: Diagnosis present

## 2019-03-14 DIAGNOSIS — Z20828 Contact with and (suspected) exposure to other viral communicable diseases: Secondary | ICD-10-CM | POA: Diagnosis present

## 2019-03-14 DIAGNOSIS — R1310 Dysphagia, unspecified: Secondary | ICD-10-CM | POA: Diagnosis present

## 2019-03-14 DIAGNOSIS — I82402 Acute embolism and thrombosis of unspecified deep veins of left lower extremity: Secondary | ICD-10-CM | POA: Diagnosis not present

## 2019-03-14 DIAGNOSIS — K5909 Other constipation: Secondary | ICD-10-CM | POA: Diagnosis present

## 2019-03-14 DIAGNOSIS — E860 Dehydration: Secondary | ICD-10-CM | POA: Diagnosis present

## 2019-03-14 DIAGNOSIS — Z515 Encounter for palliative care: Secondary | ICD-10-CM | POA: Diagnosis present

## 2019-03-14 DIAGNOSIS — Z87440 Personal history of urinary (tract) infections: Secondary | ICD-10-CM | POA: Diagnosis not present

## 2019-03-14 DIAGNOSIS — G9341 Metabolic encephalopathy: Secondary | ICD-10-CM | POA: Diagnosis present

## 2019-03-14 DIAGNOSIS — R627 Adult failure to thrive: Secondary | ICD-10-CM | POA: Diagnosis present

## 2019-03-14 DIAGNOSIS — I10 Essential (primary) hypertension: Secondary | ICD-10-CM | POA: Diagnosis present

## 2019-03-14 DIAGNOSIS — D6959 Other secondary thrombocytopenia: Secondary | ICD-10-CM | POA: Diagnosis present

## 2019-03-14 DIAGNOSIS — G629 Polyneuropathy, unspecified: Secondary | ICD-10-CM | POA: Diagnosis present

## 2019-03-14 DIAGNOSIS — D849 Immunodeficiency, unspecified: Secondary | ICD-10-CM

## 2019-03-14 DIAGNOSIS — F0390 Unspecified dementia without behavioral disturbance: Secondary | ICD-10-CM | POA: Diagnosis present

## 2019-03-14 DIAGNOSIS — R771 Abnormality of globulin: Secondary | ICD-10-CM

## 2019-03-14 DIAGNOSIS — R0902 Hypoxemia: Secondary | ICD-10-CM

## 2019-03-14 DIAGNOSIS — I2510 Atherosclerotic heart disease of native coronary artery without angina pectoris: Secondary | ICD-10-CM | POA: Diagnosis present

## 2019-03-14 DIAGNOSIS — L89152 Pressure ulcer of sacral region, stage 2: Secondary | ICD-10-CM | POA: Diagnosis present

## 2019-03-14 DIAGNOSIS — Z66 Do not resuscitate: Secondary | ICD-10-CM | POA: Diagnosis present

## 2019-03-14 DIAGNOSIS — D801 Nonfamilial hypogammaglobulinemia: Secondary | ICD-10-CM | POA: Diagnosis present

## 2019-03-14 DIAGNOSIS — L8990 Pressure ulcer of unspecified site, unspecified stage: Secondary | ICD-10-CM | POA: Insufficient documentation

## 2019-03-14 DIAGNOSIS — I82409 Acute embolism and thrombosis of unspecified deep veins of unspecified lower extremity: Secondary | ICD-10-CM | POA: Diagnosis not present

## 2019-03-14 DIAGNOSIS — I2699 Other pulmonary embolism without acute cor pulmonale: Secondary | ICD-10-CM | POA: Diagnosis present

## 2019-03-14 DIAGNOSIS — Z7189 Other specified counseling: Secondary | ICD-10-CM | POA: Diagnosis not present

## 2019-03-14 DIAGNOSIS — J9611 Chronic respiratory failure with hypoxia: Secondary | ICD-10-CM | POA: Diagnosis present

## 2019-03-14 DIAGNOSIS — I361 Nonrheumatic tricuspid (valve) insufficiency: Secondary | ICD-10-CM | POA: Diagnosis not present

## 2019-03-14 DIAGNOSIS — C9110 Chronic lymphocytic leukemia of B-cell type not having achieved remission: Principal | ICD-10-CM | POA: Diagnosis present

## 2019-03-14 DIAGNOSIS — D899 Disorder involving the immune mechanism, unspecified: Secondary | ICD-10-CM | POA: Diagnosis not present

## 2019-03-14 DIAGNOSIS — J90 Pleural effusion, not elsewhere classified: Secondary | ICD-10-CM | POA: Diagnosis present

## 2019-03-14 DIAGNOSIS — R6251 Failure to thrive (child): Secondary | ICD-10-CM | POA: Diagnosis present

## 2019-03-14 DIAGNOSIS — J910 Malignant pleural effusion: Secondary | ICD-10-CM

## 2019-03-14 DIAGNOSIS — Z9981 Dependence on supplemental oxygen: Secondary | ICD-10-CM | POA: Diagnosis not present

## 2019-03-14 LAB — COMPREHENSIVE METABOLIC PANEL
ALT: 30 U/L (ref 0–44)
AST: 26 U/L (ref 15–41)
Albumin: 2.6 g/dL — ABNORMAL LOW (ref 3.5–5.0)
Alkaline Phosphatase: 106 U/L (ref 38–126)
Anion gap: 10 (ref 5–15)
BUN: 26 mg/dL — ABNORMAL HIGH (ref 8–23)
CO2: 33 mmol/L — ABNORMAL HIGH (ref 22–32)
Calcium: 10.4 mg/dL — ABNORMAL HIGH (ref 8.9–10.3)
Chloride: 94 mmol/L — ABNORMAL LOW (ref 98–111)
Creatinine, Ser: 0.63 mg/dL (ref 0.44–1.00)
GFR calc Af Amer: >60 mL/min (ref >60)
GFR calc non Af Amer: >60 mL/min (ref >60)
Glucose, Bld: 143 mg/dL — ABNORMAL HIGH (ref 70–99)
Potassium: 5.1 mmol/L (ref 3.5–5.1)
Sodium: 137 mmol/L (ref 135–145)
Total Bilirubin: 0.7 mg/dL (ref 0.3–1.2)
Total Protein: 7 g/dL (ref 6.5–8.1)

## 2019-03-14 LAB — CBC WITH DIFFERENTIAL/PLATELET
Abs Immature Granulocytes: 0.07 10*3/uL (ref 0.00–0.07)
Basophils Absolute: 0 10*3/uL (ref 0.0–0.1)
Basophils Relative: 0 %
Eosinophils Absolute: 0.1 10*3/uL (ref 0.0–0.5)
Eosinophils Relative: 0 %
HCT: 53 % — ABNORMAL HIGH (ref 36.0–46.0)
Hemoglobin: 16.7 g/dL — ABNORMAL HIGH (ref 12.0–15.0)
Immature Granulocytes: 1 %
Lymphocytes Relative: 21 %
Lymphs Abs: 2.8 10*3/uL (ref 0.7–4.0)
MCH: 30.1 pg (ref 26.0–34.0)
MCHC: 31.5 g/dL (ref 30.0–36.0)
MCV: 95.5 fL (ref 80.0–100.0)
Monocytes Absolute: 0.8 10*3/uL (ref 0.1–1.0)
Monocytes Relative: 6 %
Neutro Abs: 9.7 10*3/uL — ABNORMAL HIGH (ref 1.7–7.7)
Neutrophils Relative %: 72 %
Platelets: 193 10*3/uL (ref 150–400)
RBC: 5.55 MIL/uL — ABNORMAL HIGH (ref 3.87–5.11)
RDW: 14.9 % (ref 11.5–15.5)
WBC: 13.4 10*3/uL — ABNORMAL HIGH (ref 4.0–10.5)
nRBC: 0 % (ref 0.0–0.2)

## 2019-03-14 LAB — LACTATE DEHYDROGENASE, PLEURAL OR PERITONEAL FLUID: LD, Fluid: 188 U/L — ABNORMAL HIGH (ref 3–23)

## 2019-03-14 LAB — BODY FLUID CELL COUNT WITH DIFFERENTIAL
Eos, Fluid: 1 %
Lymphs, Fluid: 5 %
Monocyte-Macrophage-Serous Fluid: 35 % — ABNORMAL LOW (ref 50–90)
Neutrophil Count, Fluid: 59 % — ABNORMAL HIGH (ref 0–25)
Total Nucleated Cell Count, Fluid: 1192 cu mm — ABNORMAL HIGH (ref 0–1000)

## 2019-03-14 LAB — GLUCOSE, PLEURAL OR PERITONEAL FLUID: Glucose, Fluid: 136 mg/dL

## 2019-03-14 LAB — ALBUMIN, PLEURAL OR PERITONEAL FLUID: Albumin, Fluid: 2.2 g/dL

## 2019-03-14 LAB — GLUCOSE, CAPILLARY
Glucose-Capillary: 148 mg/dL — ABNORMAL HIGH (ref 70–99)
Glucose-Capillary: 205 mg/dL — ABNORMAL HIGH (ref 70–99)

## 2019-03-14 LAB — SARS CORONAVIRUS 2 BY RT PCR (HOSPITAL ORDER, PERFORMED IN ~~LOC~~ HOSPITAL LAB): SARS Coronavirus 2: NEGATIVE

## 2019-03-14 MED ORDER — MAGNESIUM HYDROXIDE 400 MG/5ML PO SUSP: 30.0000 mL | Freq: Every day | ORAL | Status: DC | PRN

## 2019-03-14 MED ORDER — LIDOCAINE HCL 1 % IJ SOLN
INTRAMUSCULAR | Status: AC
  Filled 2019-03-14: qty 10

## 2019-03-14 MED ORDER — HEPARIN (PORCINE) 25000 UT/250ML-% IV SOLN
900.0000 [IU]/h | INTRAVENOUS | Status: DC
  Administered 2019-03-14: 900 [IU]/h via INTRAVENOUS
  Filled 2019-03-14 (×2): qty 250

## 2019-03-14 MED ORDER — SODIUM CHLORIDE 0.9 % IV BOLUS
500.0000 mL | Freq: Once | INTRAVENOUS | Status: AC
  Administered 2019-03-14: 500 mL via INTRAVENOUS

## 2019-03-14 MED ORDER — LATANOPROST 0.005 % OP SOLN
1.0000 [drp] | Freq: Every day | OPHTHALMIC | Status: DC
  Administered 2019-03-14 – 2019-03-18 (×4): 1 [drp] via OPHTHALMIC
  Filled 2019-03-14: qty 2.5

## 2019-03-14 MED ORDER — IOHEXOL 300 MG/ML  SOLN
75.0000 mL | Freq: Once | INTRAMUSCULAR | Status: AC | PRN
  Administered 2019-03-14: 75 mL via INTRAVENOUS

## 2019-03-14 MED ORDER — IPRATROPIUM-ALBUTEROL 0.5-2.5 (3) MG/3ML IN SOLN: 3.0000 mL | Freq: Four times a day (QID) | RESPIRATORY_TRACT | Status: DC | PRN

## 2019-03-14 MED ORDER — GABAPENTIN 100 MG PO CAPS
100.0000 mg | ORAL_CAPSULE | Freq: Two times a day (BID) | ORAL | Status: DC
  Administered 2019-03-14 – 2019-03-17 (×4): 100 mg via ORAL
  Filled 2019-03-14 (×5): qty 1

## 2019-03-14 MED ORDER — LIDOCAINE 5 % EX PTCH
1.0000 | MEDICATED_PATCH | CUTANEOUS | Status: DC
  Administered 2019-03-14 – 2019-03-18 (×4): 1 via TRANSDERMAL
  Filled 2019-03-14 (×6): qty 1

## 2019-03-14 MED ORDER — ONDANSETRON HCL 4 MG/2ML IJ SOLN: 4.0000 mg | Freq: Four times a day (QID) | INTRAMUSCULAR | Status: DC | PRN

## 2019-03-14 MED ORDER — DEXTROSE IN LACTATED RINGERS 5 % IV SOLN
INTRAVENOUS | Status: AC
  Administered 2019-03-14 – 2019-03-15 (×2): via INTRAVENOUS

## 2019-03-14 NOTE — Procedures (Signed)
Ultrasound-guided diagnostic and therapeutic right thoracentesis performed yielding 1.9 liters of yellow fluid. No immediate complications. Follow-up chest x-ray pending. A portion of the fluid was sent to the lab for preordered studies. EBL none.

## 2019-03-14 NOTE — Progress Notes (Signed)
ANTICOAGULATION CONSULT NOTE - Follow Up Consult  Pharmacy Consult for heparin Indication: acute pulmonary embolus  Allergies  Allergen Reactions  . Pneumococcal Vaccines Swelling and Rash    Pneumococcal Vaccine-23 valent  . Clarithromycin Other (See Comments)    REACTION: sore mouth  . Hctz [Hydrochlorothiazide]     Low Na  . Oxycodone-Aspirin Other (See Comments)    REACTION: horrible nightmares  . Propoxyphene N-Acetaminophen Other (See Comments)    REACTION: couldn't wake her up    Patient Measurements: height 66 inches, weight 59 kg   Heparin Dosing Weight: 59 kg  Vital Signs: Temp: 97.8 F (36.6 C) (08/21 0904) Temp Source: Oral (08/21 0904) BP: 119/79 (08/21 1602) Pulse Rate: 99 (08/21 1322)  Labs: Recent Labs    03/14/19 0948  HGB 16.7*  HCT 53.0*  PLT 193  CREATININE 0.63    CrCl cannot be calculated (Unknown ideal weight.).   Assessment: Patient is a 85 y.o F with hx CLL presented to the ED on 8/21 with failure to thrive.  CXR showed large pleural effusion and chest CTA shpwed findings consistent with acute PE.  Thoracentesis performed on 8/21 at ~4:30p.  Per Dr. Nevada Crane, to start heparin drip for acute PE 4 hrs after thoracentesis.   Goal of Therapy:  Heparin level 0.3-0.7 units/ml Monitor platelets by anticoagulation protocol: Yes   Plan:  - At 9PM, start heparin drip at 900 units/hr (no bolus d/t recent thoracentesis) - check 8 hr heparin level - monitor for s/s bleeding  [name] P 03/14/2019,5:07 PM

## 2019-03-14 NOTE — ED Triage Notes (Signed)
EMS reports from [place]. Staff reports prior PNA 08/02, and a cyst right upper lung lobe. Staff reports failure to thrive. Not eating, drinking.  BP 142/86 HR 92 RR 30 Sp02 95 @ 4 Ltrs 24-7 CBG 151

## 2019-03-14 NOTE — ED Notes (Signed)
Ainhara Burri (son) at [phone] with an update.

## 2019-03-14 NOTE — H&P (Signed)
History and Physical  CARLIE KARABA [id number] DOB: [birth year] DOA: 03/14/2019  Referring physician: Armstead Peaks, PA-C PCP: Cassandria Anger, MD  Outpatient Specialists: Oncology, Dr. Benay Spice Patient coming from: [place], [place]  Chief Complaint: Failure to thrive  HPI: [name] is a 85 y.o. female with medical history significant for CLL, dementia, hypertension, peripheral neuropathy, chronic constipation who presented to [facility] ED from SNF due to failure to thrive.  Patient is unable to provide a history.  History is obtained from [place], her son in the room, and medical records.  For the past week the patient has had minimal oral intake, not eating or drinking much.  No specific complaints.  No reported fever, chills, chest pain, abdominal pain, vomiting, or diarrhea.  She is on 4 L by nasal cannula at baseline.    ED Course: Lethargic on presentation, vital signs remarkable for tachycardia with heart rate in the 110's.  Lab studies remarkable for leukocytosis with WBC 13 K.  UA pending.  Chest x-ray personally reviewed showed large right pleural effusion completely obstructing right lung.  Personally obtained consent from patient's son at bedside for thoracentesis.  IR right thoracentesis yielded 1.9 L of yellow fluid.  Fluid sent for cytology.  Highly appreciate IR assistance in the care of this patient.  TRH was asked to admit.  Review of Systems: Review of systems as noted in the HPI. All other systems reviewed and are negative.   Past Medical History:  Diagnosis Date   Allergy    rhinitis   Chronic ethmoidal sinusitis    Diverticulosis    Diverticulosis of colon    GERD (gastroesophageal reflux disease)    Glaucoma    Hx of colonic polyps    Hyperlipidemia    Hypertension    Hypogammaglobulinemia ([place])    Monthly IVIG   Leukemia ([place])    Chronic lymphocytic leukemia   Osteoarthritis    Peripheral neuropathy    Pernicious anemia    UTI (lower urinary  tract infection) 2010   Vitamin B12 deficiency    Past Surgical History:  Procedure Laterality Date   ABDOMINAL HYSTERECTOMY  1987   bilateral cataracts  2007   bladder tack  1987   L4-L5 Laminetomy  1999   TUBAL LIGATION     tubaligation  1961   ureter reconstruction  1992    Social History:  reports that she has never smoked. She has never used smokeless tobacco. She reports that she does not drink alcohol or use drugs.   Allergies  Allergen Reactions   Pneumococcal Vaccines Swelling and Rash    Pneumococcal Vaccine-23 valent   Clarithromycin Other (See Comments)    REACTION: sore mouth   Hctz [Hydrochlorothiazide]     Low Na   Oxycodone-Aspirin Other (See Comments)    REACTION: horrible nightmares   Propoxyphene N-Acetaminophen Other (See Comments)    REACTION: couldn't wake her up    Family History  Problem Relation Age of Onset   Jaundice Father    Stroke Son 54       in [place]   Cancer Mother        uterine   Diabetes Mother    Other Sister        brain tumor   Cancer Sister    Hypertension Other    Cancer Other        breast   Thyroid disease Sister    Cancer Sister        thyroid cancer  Prior to Admission medications   Medication Sig Start Date End Date Taking? Authorizing Provider  acetaminophen (TYLENOL) 325 MG tablet Take 650 mg by mouth 2 (two) times a day.   Yes [provider]  alum & mag hydroxide-simeth (MI-ACID) 200-200-20 MG/5ML suspension Take 60 mLs by mouth every 4 (four) hours as needed for indigestion or heartburn.    Yes [provider]  benzonatate (TESSALON) 100 MG capsule Take 100 mg by mouth 3 (three) times daily as needed for cough.   Yes [provider]  calcium carbonate (TUMS - DOSED IN MG ELEMENTAL CALCIUM) 500 MG chewable tablet Chew 500 mg by mouth every 4 (four) hours as needed for indigestion or heartburn.   Yes [provider]  cholecalciferol (VITAMIN D) 25 MCG  (1000 UT) tablet Take 2,000 Units by mouth daily.   Yes [provider]  cromolyn (OPTICROM) 4 % ophthalmic solution Place 1 drop into both eyes 4 (four) times daily.  03/21/16  Yes [provider]  DEXILANT 30 MG capsule Take 30 mg by mouth daily. 01/28/19  Yes [provider]  fluticasone (FLONASE) 50 MCG/ACT nasal spray Place 2 sprays into both nostrils daily. 11/21/16  Yes Plotnikov, Evie Lacks, MD  folic acid (FOLVITE) 1 MG tablet Take 1 mg by mouth daily. For restless legs   Yes [provider]  gabapentin (NEURONTIN) 100 MG capsule Take 100 mg by mouth 2 (two) times a day. 02/07/19  Yes [provider]  ipratropium-albuterol (DUONEB) 0.5-2.5 (3) MG/3ML SOLN Take 3 mLs by nebulization every 6 (six) hours as needed (SOB/Wheezing).   Yes [provider]  latanoprost (XALATAN) 0.005 % ophthalmic solution PLACE 1 DROP INTO BOTH EYES AT BEDTIME. 12/31/17  Yes Plotnikov, Evie Lacks, MD  lidocaine (LIDODERM) 5 % Place 1 patch onto the skin daily. Remove & Discard patch within 12 hours or as directed by MD   Yes [provider]  loratadine (CLARITIN) 10 MG tablet TAKE 1 TABLET (10 MG TOTAL) BY MOUTH DAILY. Patient taking differently: Take 10 mg by mouth daily.  11/19/17  Yes Plotnikov, Evie Lacks, MD  magnesium hydroxide (MILK OF MAGNESIA) 400 MG/5ML suspension Take 30 mLs by mouth daily as needed for mild constipation.   Yes [provider]  Memantine HCl-Donepezil HCl (NAMZARIC) 28-10 MG CP24 Take 1 capsule by mouth daily.   Yes [provider]  mirabegron ER (MYRBETRIQ) 50 MG TB24 tablet Take 1 tablet (50 mg total) by mouth daily. 11/20/17  Yes Plotnikov, Evie Lacks, MD  Multiple Vitamins-Minerals (CERTAVITE/ANTIOXIDANTS) TABS Take 1 tablet by mouth daily.   Yes [provider]  phenol (CHLORASEPTIC) 1.4 % LIQD Use as directed 2 sprays in the mouth or throat 4 (four) times daily as needed for throat irritation / pain.    Yes  [provider]  polyethylene glycol (MIRALAX / GLYCOLAX) packet Take 17 g by mouth daily. Patient taking differently: Take 17 g by mouth daily as needed for mild constipation.  12/21/17  Yes Mikhail, Maryann, DO  senna-docusate (SENOKOT-S) 8.6-50 MG tablet Take 2 tablets by mouth 2 (two) times daily. 12/23/17  Yes Patrecia Pour, Christean Grief, MD  sucralfate (CARAFATE) 1 g tablet Take 1 g by mouth 4 (four) times daily -  before meals and at bedtime. 12/30/18  Yes [provider]  traZODone (DESYREL) 50 MG tablet Take 0.5 tablets (25 mg total) by mouth at bedtime. 11/27/18  Yes Nita Sells, MD  Cholecalciferol (VITAMIN D3) 2000 units capsule  Take 1 capsule (2,000 Units total) by mouth daily. Patient not taking: Reported on 03/14/2019 11/23/16   Plotnikov, Evie Lacks, MD  cyanocobalamin (,VITAMIN B-12,) 1000 MCG/ML injection ADMINISTER 1 CC SUBQ EVERY 4 WEEKS AS DIRECTED Patient not taking: No sig reported 08/07/16   Plotnikov, Evie Lacks, MD    Physical Exam: BP 129/85    Pulse 99    Temp 97.8 F (36.6 C) (Oral)    Resp 18    SpO2 96%    General: 86 y.o. year-old female frail-appearing and lethargic.    Cardiovascular: Regular rate and rhythm with no rubs or gallops.  No thyromegaly or JVD noted.  Trace lower extremity edema. 2/4 pulses in all 4 extremities.  Respiratory: Mild rales noted on left with no wheezes.  Poor inspiratory effort.  Abdomen: Soft nontender nondistended with normal bowel sounds x4 quadrants.  Muskuloskeletal: No cyanosis, clubbing.  Trace edema noted bilaterally in lower extremities.  Neuro: CN II-XII unable to assess.  Does not follow commands due to lethargy.  Skin: No ulcerative lesions noted or rashes  Psychiatry: Judgement and insight appear poor.  Unable to assess mood due to lethargy.          Labs on Admission:  Basic Metabolic Panel: Recent Labs  Lab 03/14/19 0948  NA 137  K 5.1  CL 94*  CO2 33*  GLUCOSE 143*  BUN 26*  CREATININE 0.63   CALCIUM 10.4*   Liver Function Tests: Recent Labs  Lab 03/14/19 0948  AST 26  ALT 30  ALKPHOS 106  BILITOT 0.7  PROT 7.0  ALBUMIN 2.6*   No results for input(s): LIPASE, AMYLASE in the last 168 hours. No results for input(s): AMMONIA in the last 168 hours. CBC: Recent Labs  Lab 03/14/19 0948  WBC 13.4*  NEUTROABS 9.7*  HGB 16.7*  HCT 53.0*  MCV 95.5  PLT 193   Cardiac Enzymes: No results for input(s): CKTOTAL, CKMB, CKMBINDEX, TROPONINI in the last 168 hours.  BNP (last 3 results) No results for input(s): BNP in the last 8760 hours.  ProBNP (last 3 results) No results for input(s): PROBNP in the last 8760 hours.  CBG: No results for input(s): GLUCAP in the last 168 hours.  Radiological Exams on Admission: Ct Chest W Contrast  Result Date: 03/14/2019 CLINICAL DATA:  85 year old female with a pleural effusion EXAM: CT CHEST WITH CONTRAST TECHNIQUE: Multidetector CT imaging of the chest was performed during intravenous contrast administration. CONTRAST:  43mL OMNIPAQUE IOHEXOL 300 MG/ML  SOLN COMPARISON:  Chest x-ray today March 14, 2019, CT abdomen Dec 21, 2017 and Dec 16, 2017 FINDINGS: Cardiovascular: Heart: No cardiomegaly. No pericardial fluid/thickening. Coronary calcifications of the left anterior descending coronary artery. Aorta: Unremarkable course, caliber, contour of the thoracic aorta. No aneurysm or dissection flap. No periaortic fluid. Pulmonary arteries: The bolus timing is not optimized for evaluation of the pulmonary arteries. They were is a filling defect within the left main pulmonary artery extending into the segmental branches of the left lower lobe. This was not imaged on any of the recent CT chest. Mediastinum/Nodes: Unremarkable thoracic inlet. No supraclavicular or axillary adenopathy. Unremarkable thoracic esophagus. Leftward shift of the mediastinal structures and the heart. Lungs/Pleura: Right-sided pleural effusion contributes to complete  collapse of the right upper lobe. Collapse of the right lower lobe, and near complete collapse of the right middle lobe. Airspace disease within the residual aerated segment of the right middle lobe. There are enhancing soft tissue foci on the parietal  pleura at the right lung apex and at the anterior pleural space, concerning for metastatic implants. Mixed linear, ground-glass, and nodular opacities of the left upper lobe and the left lower lobe, which may represent atelectasis. No evidence of left-sided pleural effusion. Upper Abdomen: No acute. Musculoskeletal: Degenerative changes of the spine. No acute displaced fracture identified. Review of the MIP images confirms the above findings. IMPRESSION: Right-sided pleural effusion contributes to complete volume loss of the right upper lobe and right lower lobe, with near complete volume loss of the middle lobe. Volume of fluid contributes to leftward shift of the mediastinum and heart. Filling defect of the left main pulmonary artery extending into segmental branches compatible with pulmonary embolism. Given the mural adherence of the filling defects, subacute thrombus is favored though there are no comparison studies. Enhancing nodularity of the right pleural surfaces suggestive of metastatic disease and malignant effusion. Coronary atherosclerosis. Electronically Signed   By: Corrie Mckusick D.O.   On: 03/14/2019 13:18   Dg Chest Portable 1 View  Result Date: 03/14/2019 CLINICAL DATA:  Failure to thrive. History of chronic lymphocytic leukemia. EXAM: PORTABLE CHEST 1 VIEW COMPARISON:  Radiographs of September 16, 2017. FINDINGS: There is now noted nearly complete opacification of the right hemithorax with mild mediastinal shift to the left. This is most consistent with pleural effusion with possible associated atelectasis. Left lung is unremarkable. Atherosclerosis of thoracic aorta is noted. No pneumothorax is noted. Bony thorax is unremarkable. IMPRESSION:  Nearly complete opacification of the right hemithorax is noted with mild mediastinal shift to the right most consistent with large pleural effusion and possibly associated atelectasis. Aortic Atherosclerosis (ICD10-I70.0). Electronically Signed   By: Marijo Conception M.D.   On: 03/14/2019 10:05    EKG: I independently viewed the EKG done and my findings are as followed: None available at the time of this visit.  Assessment/Plan Present on Admission:  Failure to thrive (0-17)  Active Problems:   Failure to thrive (0-17)  Failure to thrive likely multifactorial in the setting of CLL versus large right pleural effusion Patient has had very poor oral intake in the last week Speech therapy consulted for swallow eval Aspiration precautions Encourage increase oral protein calorie intake if able to swallow safely Dietitian consult  Acute metabolic encephalopathy in the setting of dementia likely multifactorial secondary to failure to thrive/dehydration versus large right pleural effusion versus pulmonary embolism Treat underlying conditions  Large right pleural effusion suspect malignant status post right thoracentesis by IR on 03/14/2019 Personally reviewed chest x-ray done on admission which showed large right pleural effusion almost completely occluding right lung Right thoracentesis by IR on 03/14/2019.  Highly appreciate assistance in the care of this patient. Thoracentesis yielded 1.9 L of yellow fluid, awaiting results of cytology Continuous O2 monitoring  Newly diagnosed pulmonary embolism Seen on CT chest with contrast done today Obtain 2D echo Start heparin drip at least 4 hours after procedure  Leukocytosis, possibly reactive in the setting of PE No sign of active infective process Repeat CBC with differential in the morning  Acute dehydration Start gentle IV fluid hydration D5 LR at 75 cc/h Monitor urine output Closely monitor volume status  CLL Follows with Dr. Benay Spice  outpatient   Goals of care Palliative care team consulted to establish goals of care  Hypertension Blood pressure is soft Hold off antihypertensive Continue to closely monitor vital signs  Chronic hypoxic respiratory failure She is on 4 L oxygen supplementation at baseline Continue to closely monitor  Chronic constipation Resume outpatient medications  Risks: Patient is at high risk for decompensation due to large right pleural effusion, newly diagnosed pulmonary embolism in the setting of CLL, multiple comorbidities and advanced age.  Patient will require at least 2 midnights for further evaluation and treatment of present condition.   DVT prophylaxis: Heparin drip  Code Status: DNR, she has a DNR form in the room.  Family Communication: Updated her son and obtained consent for thoracentesis and heparin drip at bedside.  Disposition Plan: Admit to telemetry unit  Consults called: Interventional radiology  Admission status: Inpatient status    Kayleen Memos MD [facility] Pager [phone]  If 7PM-7AM, please contact night-coverage [email or web address] Password [facility]  03/14/2019, 4:29 PM

## 2019-03-14 NOTE — ED Provider Notes (Signed)
[facility] DEPT Provider Note   CSN: [id number] Arrival date & time: 03/14/19  [id number]     History   Chief Complaint Chief Complaint  Patient presents with  . Failure To Thrive    HPI [name] is a 85 y.o. female who presents from [facility] for failure to thrive.  Patient has not been feeling well for the past week and not eating.  She began drinking last evening.  She was unable to take her medications.  Patient was treated for pneumonia earlier this month with 7 days of Levaquin.  Repeat chest x-ray showed a possible cyst and patient had a CT scheduled for today to assess it.  However, she continued to decline, so the facility physician sent her here for further work-up.  Patient is on 4 L of oxygen at baseline.  She normally walks with a walker and is alert and oriented.  She normally answers questions and talks normally.  She has been a lot more quiet and weak with speaking, however is able to answer questions.  Most of the above information was gathered from [employer], [place]. Patient denies any chest pain, worsening shortness of breath, abdominal pain, nausea, vomiting, urinary symptoms.     HPI  Past Medical History:  Diagnosis Date  . Allergy    rhinitis  . Chronic ethmoidal sinusitis   . Diverticulosis   . Diverticulosis of colon   . GERD (gastroesophageal reflux disease)   . Glaucoma   . Hx of colonic polyps   . Hyperlipidemia   . Hypertension   . Hypogammaglobulinemia ([place])    Monthly IVIG  . Leukemia ([place])    Chronic lymphocytic leukemia  . Osteoarthritis   . Peripheral neuropathy   . Pernicious anemia   . UTI (lower urinary tract infection) 2010  . Vitamin B12 deficiency     Patient Active Problem List   Diagnosis Date Noted  . Failure to thrive (0-17) 03/14/2019  . AKI (acute kidney injury) ([place]) 11/26/2018  . Rectus sheath hematoma 12/21/2017  . Altered mental status 12/16/2017  . Diarrhea 09/19/2017  .  Constipation 09/12/2017  . Alzheimer disease ([place]) 07/25/2017  . Neck pain on left side 07/25/2017  . Urinary tract infection, recurrent 06/12/2017  . Thrush 02/10/2017  . Frequent urination 02/10/2017  . Osteoporosis 11/23/2016  . Abdominal pain 08/16/2016  . Urinary frequency 08/16/2016  . Peripheral edema 08/16/2016  . General weakness 08/16/2016  . Gait disorder 08/16/2016  . Dysuria 08/03/2016  . Closed displaced simple supracondylar fracture of right humerus without intercondylar fracture 05/10/2016  . Urinary tract infection without hematuria 05/10/2016  . Syncope 05/10/2016  . Humeral head fracture, right, closed, initial encounter   . Burning tongue 03/25/2016  . Post concussion syndrome 03/03/2016  . Fall 03/03/2016  . Cystocele with prolapse 03/05/2015  . Dehydration 02/08/2015  . Hyponatremia 02/08/2015  . Well adult exam 10/01/2014  . Grief at loss of child 12/11/2013  . Irregular heart beat 09/29/2013  . Humeral head fracture 08/26/2013  . Actinic keratoses 08/26/2013  . Carotid bruit 05/02/2012  . Anxiety 03/17/2011  . ANEMIA, PERNICIOUS, HX OF 10/10/2007  . Peripheral neuropathy ([place]) 09/04/2007  . Chronic fatigue 08/06/2007  . Chronic lymphocytic leukemia ([place]) 06/17/2007  . Allergic rhinitis 06/17/2007  . OSTEOARTHRITIS 06/17/2007  . B12 deficiency 06/07/2007  . GERD (gastroesophageal reflux disease) 04/30/2007  . Dyslipidemia 12/19/2006  . Essential hypertension 12/19/2006  . DIVERTICULOSIS, COLON 05/19/2003  . COLONIC POLYPS, HX  OF 01/10/2000    Past Surgical History:  Procedure Laterality Date  . ABDOMINAL HYSTERECTOMY  1987  . bilateral cataracts  2007  . bladder tack  1987  . L4-L5 Laminetomy  1999  . TUBAL LIGATION    . tubaligation  1961  . ureter reconstruction  1992     OB History    Gravida  4   Para  4   Term      Preterm      AB      Living  4     SAB      TAB      Ectopic      Multiple      Live Births                Home Medications    Prior to Admission medications   Medication Sig Start Date End Date Taking? Authorizing Provider  acetaminophen (TYLENOL) 325 MG tablet Take 650 mg by mouth 2 (two) times a day.   Yes [provider]  alum & mag hydroxide-simeth (MI-ACID) 200-200-20 MG/5ML suspension Take 60 mLs by mouth every 4 (four) hours as needed for indigestion or heartburn.    Yes [provider]  benzonatate (TESSALON) 100 MG capsule Take 100 mg by mouth 3 (three) times daily as needed for cough.   Yes [provider]  calcium carbonate (TUMS - DOSED IN MG ELEMENTAL CALCIUM) 500 MG chewable tablet Chew 500 mg by mouth every 4 (four) hours as needed for indigestion or heartburn.   Yes [provider]  cholecalciferol (VITAMIN D) 25 MCG (1000 UT) tablet Take 2,000 Units by mouth daily.   Yes [provider]  cromolyn (OPTICROM) 4 % ophthalmic solution Place 1 drop into both eyes 4 (four) times daily.  03/21/16  Yes [provider]  DEXILANT 30 MG capsule Take 30 mg by mouth daily. 01/28/19  Yes [provider]  fluticasone (FLONASE) 50 MCG/ACT nasal spray Place 2 sprays into both nostrils daily. 11/21/16  Yes Plotnikov, Evie Lacks, MD  folic acid (FOLVITE) 1 MG tablet Take 1 mg by mouth daily. For restless legs   Yes [provider]  gabapentin (NEURONTIN) 100 MG capsule Take 100 mg by mouth 2 (two) times a day. 02/07/19  Yes [provider]  ipratropium-albuterol (DUONEB) 0.5-2.5 (3) MG/3ML SOLN Take 3 mLs by nebulization every 6 (six) hours as needed (SOB/Wheezing).   Yes [provider]  latanoprost (XALATAN) 0.005 % ophthalmic solution PLACE 1 DROP INTO BOTH EYES AT BEDTIME. 12/31/17  Yes Plotnikov, Evie Lacks, MD  lidocaine (LIDODERM) 5 % Place 1 patch onto the skin daily. Remove & Discard patch within 12 hours or as directed by MD   Yes [provider]  loratadine (CLARITIN) 10 MG tablet TAKE 1  TABLET (10 MG TOTAL) BY MOUTH DAILY. Patient taking differently: Take 10 mg by mouth daily.  11/19/17  Yes Plotnikov, Evie Lacks, MD  magnesium hydroxide (MILK OF MAGNESIA) 400 MG/5ML suspension Take 30 mLs by mouth daily as needed for mild constipation.   Yes [provider]  Memantine HCl-Donepezil HCl (NAMZARIC) 28-10 MG CP24 Take 1 capsule by mouth daily.   Yes [provider]  mirabegron ER (MYRBETRIQ) 50 MG TB24 tablet Take 1 tablet (50 mg total) by mouth daily. 11/20/17  Yes Plotnikov, Evie Lacks, MD  Multiple Vitamins-Minerals (CERTAVITE/ANTIOXIDANTS) TABS Take 1 tablet by mouth daily.   Yes [provider]  phenol (CHLORASEPTIC) 1.4 %  LIQD Use as directed 2 sprays in the mouth or throat 4 (four) times daily as needed for throat irritation / pain.    Yes [provider]  polyethylene glycol (MIRALAX / GLYCOLAX) packet Take 17 g by mouth daily. Patient taking differently: Take 17 g by mouth daily as needed for mild constipation.  12/21/17  Yes Mikhail, Maryann, DO  senna-docusate (SENOKOT-S) 8.6-50 MG tablet Take 2 tablets by mouth 2 (two) times daily. 12/23/17  Yes Patrecia Pour, Christean Grief, MD  sucralfate (CARAFATE) 1 g tablet Take 1 g by mouth 4 (four) times daily -  before meals and at bedtime. 12/30/18  Yes [provider]  traZODone (DESYREL) 50 MG tablet Take 0.5 tablets (25 mg total) by mouth at bedtime. 11/27/18  Yes Nita Sells, MD  Cholecalciferol (VITAMIN D3) 2000 units capsule Take 1 capsule (2,000 Units total) by mouth daily. Patient not taking: Reported on 03/14/2019 11/23/16   Plotnikov, Evie Lacks, MD  cyanocobalamin (,VITAMIN B-12,) 1000 MCG/ML injection ADMINISTER 1 CC SUBQ EVERY 4 WEEKS AS DIRECTED Patient not taking: No sig reported 08/07/16   Plotnikov, Evie Lacks, MD    Family History Family History  Problem Relation Age of Onset  . Jaundice Father   . Stroke Son 61       in NH  . Cancer Mother        uterine  . Diabetes Mother    . Other Sister        brain tumor  . Cancer Sister   . Hypertension Other   . Cancer Other        breast  . Thyroid disease Sister   . Cancer Sister        thyroid cancer    Social History Social History   Tobacco Use  . Smoking status: Never Smoker  . Smokeless tobacco: Never Used  Substance Use Topics  . Alcohol use: No    Alcohol/week: 0.0 standard drinks  . Drug use: No     Allergies   Pneumococcal vaccines, Clarithromycin, Hctz [hydrochlorothiazide], Oxycodone-aspirin, and Propoxyphene n-acetaminophen   Review of Systems Review of Systems  Constitutional: Positive for fatigue. Negative for chills and fever.  HENT: Negative for facial swelling and sore throat.   Respiratory: Positive for shortness of breath (baseline 4L).   Cardiovascular: Negative for chest pain.  Gastrointestinal: Negative for abdominal pain, nausea and vomiting.  Genitourinary: Negative for dysuria.  Musculoskeletal: Negative for back pain.  Skin: Negative for rash and wound.  Neurological: Positive for weakness (generalized). Negative for headaches.  Psychiatric/Behavioral: The patient is not nervous/anxious.      Physical Exam Updated Vital Signs BP 129/85   Pulse 99   Temp 97.8 F (36.6 C) (Oral)   Resp 18   SpO2 96%   Physical Exam Vitals signs and nursing note reviewed.  Constitutional:      General: She is not in acute distress.    Appearance: She is well-developed. She is not diaphoretic.  HENT:     Head: Normocephalic and atraumatic.     Mouth/Throat:     Mouth: Mucous membranes are dry.     Pharynx: No oropharyngeal exudate.  Eyes:     General: No scleral icterus.       Right eye: No discharge.        Left eye: No discharge.     Conjunctiva/sclera: Conjunctivae normal.     Pupils: Pupils are equal, round, and reactive to light.  Neck:     Musculoskeletal:  Normal range of motion and neck supple.     Thyroid: No thyromegaly.  Cardiovascular:     Rate and Rhythm:  Normal rate and regular rhythm.     Heart sounds: Normal heart sounds. No murmur. No friction rub. No gallop.   Pulmonary:     Effort: Pulmonary effort is normal. Tachypnea (mild) present. No respiratory distress.     Breath sounds: Normal breath sounds. No stridor. No wheezing or rales.     Comments: 4L  Abdominal:     General: Bowel sounds are normal. There is no distension.     Palpations: Abdomen is soft.     Tenderness: There is no abdominal tenderness. There is no guarding or rebound.  Lymphadenopathy:     Cervical: No cervical adenopathy.  Skin:    General: Skin is warm and dry.     Coloration: Skin is not pale.     Findings: No rash.  Neurological:     Mental Status: She is alert.     Coordination: Coordination normal.      ED Treatments / Results  Labs (all labs ordered are listed, but only abnormal results are displayed) Labs Reviewed  COMPREHENSIVE METABOLIC PANEL - Abnormal; Notable for the following components:      Result Value   Chloride 94 (*)    CO2 33 (*)    Glucose, Bld 143 (*)    BUN 26 (*)    Calcium 10.4 (*)    Albumin 2.6 (*)    All other components within normal limits  CBC WITH DIFFERENTIAL/PLATELET - Abnormal; Notable for the following components:   WBC 13.4 (*)    RBC 5.55 (*)    Hemoglobin 16.7 (*)    HCT 53.0 (*)    Neutro Abs 9.7 (*)    All other components within normal limits  SARS CORONAVIRUS 2 (HOSPITAL ORDER, [facility])  URINALYSIS, ROUTINE W REFLEX MICROSCOPIC    EKG None  Radiology Ct Chest W Contrast  Result Date: 03/14/2019 CLINICAL DATA:  85 year old female with a pleural effusion EXAM: CT CHEST WITH CONTRAST TECHNIQUE: Multidetector CT imaging of the chest was performed during intravenous contrast administration. CONTRAST:  40mL OMNIPAQUE IOHEXOL 300 MG/ML  SOLN COMPARISON:  Chest x-ray today March 14, 2019, CT abdomen Dec 21, 2017 and Dec 16, 2017 FINDINGS: Cardiovascular: Heart: No  cardiomegaly. No pericardial fluid/thickening. Coronary calcifications of the left anterior descending coronary artery. Aorta: Unremarkable course, caliber, contour of the thoracic aorta. No aneurysm or dissection flap. No periaortic fluid. Pulmonary arteries: The bolus timing is not optimized for evaluation of the pulmonary arteries. They were is a filling defect within the left main pulmonary artery extending into the segmental branches of the left lower lobe. This was not imaged on any of the recent CT chest. Mediastinum/Nodes: Unremarkable thoracic inlet. No supraclavicular or axillary adenopathy. Unremarkable thoracic esophagus. Leftward shift of the mediastinal structures and the heart. Lungs/Pleura: Right-sided pleural effusion contributes to complete collapse of the right upper lobe. Collapse of the right lower lobe, and near complete collapse of the right middle lobe. Airspace disease within the residual aerated segment of the right middle lobe. There are enhancing soft tissue foci on the parietal pleura at the right lung apex and at the anterior pleural space, concerning for metastatic implants. Mixed linear, ground-glass, and nodular opacities of the left upper lobe and the left lower lobe, which may represent atelectasis. No evidence of left-sided pleural effusion. Upper Abdomen: No acute. Musculoskeletal:  Degenerative changes of the spine. No acute displaced fracture identified. Review of the MIP images confirms the above findings. IMPRESSION: Right-sided pleural effusion contributes to complete volume loss of the right upper lobe and right lower lobe, with near complete volume loss of the middle lobe. Volume of fluid contributes to leftward shift of the mediastinum and heart. Filling defect of the left main pulmonary artery extending into segmental branches compatible with pulmonary embolism. Given the mural adherence of the filling defects, subacute thrombus is favored though there are no comparison  studies. Enhancing nodularity of the right pleural surfaces suggestive of metastatic disease and malignant effusion. Coronary atherosclerosis. Electronically Signed   By: Corrie Mckusick D.O.   On: 03/14/2019 13:18   Dg Chest Portable 1 View  Result Date: 03/14/2019 CLINICAL DATA:  Failure to thrive. History of chronic lymphocytic leukemia. EXAM: PORTABLE CHEST 1 VIEW COMPARISON:  Radiographs of September 16, 2017. FINDINGS: There is now noted nearly complete opacification of the right hemithorax with mild mediastinal shift to the left. This is most consistent with pleural effusion with possible associated atelectasis. Left lung is unremarkable. Atherosclerosis of thoracic aorta is noted. No pneumothorax is noted. Bony thorax is unremarkable. IMPRESSION: Nearly complete opacification of the right hemithorax is noted with mild mediastinal shift to the right most consistent with large pleural effusion and possibly associated atelectasis. Aortic Atherosclerosis (ICD10-I70.0). Electronically Signed   By: Marijo Conception M.D.   On: 03/14/2019 10:05    Procedures Procedures (including critical care time)  Medications Ordered in ED Medications  sodium chloride 0.9 % bolus 500 mL (0 mLs Intravenous Stopped 03/14/19 1207)  iohexol (OMNIPAQUE) 300 MG/ML solution 75 mL (75 mLs Intravenous Contrast Given 03/14/19 1234)     Initial Impression / Assessment and Plan / ED Course  I have reviewed the triage vital signs and the nursing notes.  Pertinent labs & imaging results that were available during my care of the patient were reviewed by me and considered in my medical decision making (see chart for details).        Patient presenting with symptoms of failure to thrive.  She is found to be tachypneic, but on her baseline level of oxygen, 4 L.  Patient found to have a almost near complete volume loss of the right lung with pleural effusion with a leftward shift of the mediastinum and heart.  Patient also has  findings compatible with a pulmonary embolism in the left main pulmonary artery extending into segmental branches.  Patient has enhancing nodularity of the right pleural surfaces suggestive of metastatic disease malignant effusion.  Patient has known CLL, however there is no evidence of knowledge of another primary cancer.  I discussed these findings with the patient's son and the patient and the probability of hospice care.  He seems to be in agreement.  We discussed therapeutic thoracentesis, which he agrees.  I discussed patient case with Dr. Nevada Crane with [facility] who accepts patient for admission.  I appreciate her assistance with the patient.  Patient also evaluated by my attending, Dr. Vanita Panda, who guided the patient's management and agrees with plan.  Final Clinical Impressions(s) / ED Diagnoses   Final diagnoses:  Failure to thrive in adult  Pleural effusion    ED Discharge Orders    None       Frederica Kuster, PA-C 03/14/19 1517    Carmin Muskrat, MD 03/17/19 2314

## 2019-03-15 ENCOUNTER — Inpatient Hospital Stay (HOSPITAL_COMMUNITY): Payer: Medicare Other

## 2019-03-15 ENCOUNTER — Encounter (HOSPITAL_COMMUNITY): Payer: Self-pay

## 2019-03-15 DIAGNOSIS — D849 Immunodeficiency, unspecified: Secondary | ICD-10-CM

## 2019-03-15 DIAGNOSIS — D899 Disorder involving the immune mechanism, unspecified: Secondary | ICD-10-CM

## 2019-03-15 DIAGNOSIS — R771 Abnormality of globulin: Secondary | ICD-10-CM

## 2019-03-15 DIAGNOSIS — I2699 Other pulmonary embolism without acute cor pulmonale: Secondary | ICD-10-CM

## 2019-03-15 DIAGNOSIS — Z7189 Other specified counseling: Secondary | ICD-10-CM

## 2019-03-15 DIAGNOSIS — Z515 Encounter for palliative care: Secondary | ICD-10-CM

## 2019-03-15 DIAGNOSIS — C9110 Chronic lymphocytic leukemia of B-cell type not having achieved remission: Principal | ICD-10-CM

## 2019-03-15 DIAGNOSIS — J90 Pleural effusion, not elsewhere classified: Secondary | ICD-10-CM

## 2019-03-15 DIAGNOSIS — R627 Adult failure to thrive: Secondary | ICD-10-CM

## 2019-03-15 DIAGNOSIS — J9611 Chronic respiratory failure with hypoxia: Secondary | ICD-10-CM

## 2019-03-15 LAB — URINALYSIS, ROUTINE W REFLEX MICROSCOPIC
Bilirubin Urine: NEGATIVE
Glucose, UA: NEGATIVE mg/dL
Hgb urine dipstick: NEGATIVE
Ketones, ur: NEGATIVE mg/dL
Leukocytes,Ua: NEGATIVE
Nitrite: NEGATIVE
Protein, ur: NEGATIVE mg/dL
Specific Gravity, Urine: 1.035 — ABNORMAL HIGH (ref 1.005–1.030)
pH: 6 (ref 5.0–8.0)

## 2019-03-15 LAB — CBC
HCT: 53.5 % — ABNORMAL HIGH (ref 36.0–46.0)
Hemoglobin: 17.1 g/dL — ABNORMAL HIGH (ref 12.0–15.0)
MCH: 29.7 pg (ref 26.0–34.0)
MCHC: 32 g/dL (ref 30.0–36.0)
MCV: 93 fL (ref 80.0–100.0)
Platelets: 157 10*3/uL (ref 150–400)
RBC: 5.75 MIL/uL — ABNORMAL HIGH (ref 3.87–5.11)
RDW: 15.1 % (ref 11.5–15.5)
WBC: 15.1 10*3/uL — ABNORMAL HIGH (ref 4.0–10.5)
nRBC: 0 % (ref 0.0–0.2)

## 2019-03-15 LAB — HEPARIN LEVEL (UNFRACTIONATED): Heparin Unfractionated: 0.45 IU/mL (ref 0.30–0.70)

## 2019-03-15 LAB — GLUCOSE, CAPILLARY
Glucose-Capillary: 209 mg/dL — ABNORMAL HIGH (ref 70–99)
Glucose-Capillary: 218 mg/dL — ABNORMAL HIGH (ref 70–99)
Glucose-Capillary: 193 mg/dL — ABNORMAL HIGH (ref 70–99)
Glucose-Capillary: 204 mg/dL — ABNORMAL HIGH (ref 70–99)

## 2019-03-15 MED ORDER — DEXTROSE-NACL 5-0.45 % IV SOLN
INTRAVENOUS | Status: AC
  Administered 2019-03-15 – 2019-03-16 (×2): via INTRAVENOUS

## 2019-03-15 MED ORDER — BISACODYL 10 MG RE SUPP
10.0000 mg | Freq: Every day | RECTAL | Status: AC
  Administered 2019-03-15 – 2019-03-17 (×3): 10 mg via RECTAL
  Filled 2019-03-15 (×3): qty 1

## 2019-03-15 MED ORDER — ENOXAPARIN SODIUM 60 MG/0.6ML ~~LOC~~ SOLN
1.0000 mg/kg | Freq: Two times a day (BID) | SUBCUTANEOUS | Status: DC
  Administered 2019-03-15 – 2019-03-19 (×7): 60 mg via SUBCUTANEOUS
  Filled 2019-03-15 (×7): qty 0.6

## 2019-03-15 MED ORDER — LIP MEDEX EX OINT
TOPICAL_OINTMENT | CUTANEOUS | Status: AC
  Administered 2019-03-15: 16:00:00
  Filled 2019-03-15: qty 7

## 2019-03-15 NOTE — Progress Notes (Signed)
VASCULAR LAB PRELIMINARY  PRELIMINARY  PRELIMINARY  PRELIMINARY  Bilateral lower extremity venous duplex completed.    Preliminary report:  See CV proc for preliminary results.   Feliciano Wynter, RVT 03/15/2019, 5:02 PM

## 2019-03-15 NOTE — Progress Notes (Signed)
Writer spoke to lab concerning add-on orders for right lung fluid from thoracentesis yesterday. Lab stated they still had this fluid and would add the cultures/stain onto the testing.

## 2019-03-15 NOTE — Progress Notes (Signed)
ANTICOAGULATION CONSULT NOTE -   Pharmacy Consult for heparin>>enoxaparin Indication: acute pulmonary embolus  Allergies  Allergen Reactions  . Pneumococcal Vaccines Swelling and Rash    Pneumococcal Vaccine-23 valent  . Clarithromycin Other (See Comments)    REACTION: sore mouth  . Hctz [Hydrochlorothiazide]     Low Na  . Oxycodone-Aspirin Other (See Comments)    REACTION: horrible nightmares  . Propoxyphene N-Acetaminophen Other (See Comments)    REACTION: couldn't wake her up    Patient Measurements: height 66 inches, weight 59 kg Height: 5\' 3"  (160 cm) Weight: 130 lb (59 kg) IBW/kg (Calculated) : 52.4 Heparin Dosing Weight: 59 kg  Vital Signs: Temp: 97.8 F (36.6 C) (08/22 1458) Temp Source: Oral (08/22 1458) BP: 131/114 (08/22 1458) Pulse Rate: 93 (08/22 1458)  Labs: Recent Labs    03/14/19 0948 03/15/19 0537  HGB 16.7* 17.1*  HCT 53.0* 53.5*  PLT 193 157  HEPARINUNFRC  --  0.45  CREATININE 0.63  --     Estimated Creatinine Clearance: 38.7 mL/min (by C-G formula based on SCr of 0.63 mg/dL).   Assessment: Patient is a 85 y.o F with hx CLL presented to the ED on 8/21 with failure to thrive.  CXR showed large pleural effusion and chest CTA shpwed findings consistent with acute PE.  Thoracentesis performed on 8/21 at ~4:30p.  Per Dr. Nevada Crane, to start heparin drip for acute PE 4 hrs after thoracentesis.  Today, 8/22 0059 HL = 0.45 at goal, no bleeding or infusion issues reported by RN.  CBC WNL Scr 0.63, CrCl ~ 38.36mls/min  1544 Lab unable to get blood for heparin level, d/w MD>>tx pt to enoxaparin  Goal of Therapy:  Monitor platelets by anticoagulation protocol: Yes   Plan:  - stop heparin drip and begin enoxaparin 60mg  (1mg /kg) SQ q12h - obtain CBC q72 hours - f/u for s/s of bleeding  Dolly Rias RPh 03/15/2019, 3:49 PM Pager [phone]

## 2019-03-15 NOTE — Plan of Care (Signed)
Patient will tolerate Dys 1 diet and thin liquids.

## 2019-03-15 NOTE — Progress Notes (Signed)
Pt remains lethargic. Arouses with voice command.

## 2019-03-15 NOTE — Progress Notes (Signed)
ANTICOAGULATION CONSULT NOTE - Follow Up Consult  Pharmacy Consult for heparin Indication: acute pulmonary embolus  Allergies  Allergen Reactions  . Pneumococcal Vaccines Swelling and Rash    Pneumococcal Vaccine-23 valent  . Clarithromycin Other (See Comments)    REACTION: sore mouth  . Hctz [Hydrochlorothiazide]     Low Na  . Oxycodone-Aspirin Other (See Comments)    REACTION: horrible nightmares  . Propoxyphene N-Acetaminophen Other (See Comments)    REACTION: couldn't wake her up    Patient Measurements: height 66 inches, weight 59 kg Height: 5\' 3"  (160 cm) Weight: 130 lb (59 kg) IBW/kg (Calculated) : 52.4 Heparin Dosing Weight: 59 kg  Vital Signs: Temp: 99.1 F (37.3 C) (08/22 0441) Temp Source: Axillary (08/22 0441) BP: 100/78 (08/22 0441) Pulse Rate: 89 (08/22 0441)  Labs: Recent Labs    03/14/19 0948 03/15/19 0537  HGB 16.7*  --   HCT 53.0*  --   PLT 193  --   HEPARINUNFRC  --  0.45  CREATININE 0.63  --     Estimated Creatinine Clearance: 38.7 mL/min (by C-G formula based on SCr of 0.63 mg/dL).   Assessment: Patient is a 85 y.o F with hx CLL presented to the ED on 8/21 with failure to thrive.  CXR showed large pleural effusion and chest CTA shpwed findings consistent with acute PE.  Thoracentesis performed on 8/21 at ~4:30p.  Per Dr. Nevada Crane, to start heparin drip for acute PE 4 hrs after thoracentesis. Today, 8/22 0059 HL = 0.45 at goal, no bleeding or infusion issues reported by RN.    Goal of Therapy:  Heparin level 0.3-0.7 units/ml Monitor platelets by anticoagulation protocol: Yes   Plan:  - Continue heparin drip at 900 units/hr -recheck HL later today to  confirm - monitor for s/s bleeding  Dorrene German 03/15/2019,6:22 AM

## 2019-03-15 NOTE — Evaluation (Signed)
Clinical/Bedside Swallow Evaluation Patient Details  Name: [name] MRN: [id number] Date of Birth: 1925-10-10  Today's Date: 03/15/2019 Time: SLP Start Time (ACUTE ONLY): 0325 SLP Stop Time (ACUTE ONLY): 0359 SLP Time Calculation (min) (ACUTE ONLY): 34 min  Past Medical History:  Past Medical History:  Diagnosis Date  . Allergy    rhinitis  . Chronic ethmoidal sinusitis   . Diverticulosis   . Diverticulosis of colon   . GERD (gastroesophageal reflux disease)   . Glaucoma   . Hx of colonic polyps   . Hyperlipidemia   . Hypertension   . Hypogammaglobulinemia ([place])    Monthly IVIG  . Leukemia ([place])    Chronic lymphocytic leukemia  . Osteoarthritis   . Peripheral neuropathy   . Pernicious anemia   . UTI (lower urinary tract infection) 2010  . Vitamin B12 deficiency    Past Surgical History:  Past Surgical History:  Procedure Laterality Date  . ABDOMINAL HYSTERECTOMY  1987  . bilateral cataracts  2007  . bladder tack  1987  . L4-L5 Laminetomy  1999  . TUBAL LIGATION    . tubaligation  1961  . ureter reconstruction  1992   HPI:  Ms [name], 89y/f, preented to ED from SNF due to failure to thrive. For 1 week she has had minimal oral intake and lethargic. PMH significant for CLL, dementia, hypertension, peripheral neuropathy and chronic constipation. Found to have a large right pleural effusion completely obstraucting right lung. IR right thoracentesis yielded 1.9L of yellow fluid. Son does not know his mothers previouis diet or if she has a history of swallowing problems.    Assessment / Plan / Recommendation Clinical Impression  Patient presents with a mild oral dysphagia, likely due to her cognitive status at the moment. Patient was letheragic and drowsy throughout evaluation. She would wake to her name, verbally respond to questions and follow commands, however fall asleep when stimulation removed. She managed liquids and purees well, but had prolonged mastication  with  soft solids (cracker) and poor bolus manipulation.  Her swallow initiation with all consistencies was timely with no s/sx of aspiration, no vocal or respiratory changes. Recommend Dys 1, thin liquid diet. Speech therapy to follow up for diet tolerance and advancement.  SLP Visit Diagnosis: Dysphagia, oral phase (R13.11)    Aspiration Risk  Mild aspiration risk    Diet Recommendation Dysphagia 1 (Puree);Thin liquid   Liquid Administration via: Cup;Straw Medication Administration: Whole meds with puree Supervision: Full supervision/cueing for compensatory strategies Compensations: Minimize environmental distractions;Slow rate;Small sips/bites(feed only when fully alert/awake) Postural Changes: Seated upright at 90 degrees    Other  Recommendations Oral Care Recommendations: Oral care BID   Follow up Recommendations Skilled Nursing facility      Frequency and Duration min 1 x/week  1 week       Prognosis Prognosis for Safe Diet Advancement: Good      Swallow Study   General Date of Onset: 03/14/19 HPI: Ms [name], 89y/f, preented to ED from SNF due to failure to thrive. For 1 week she has had minimal oral intake and lethargic. PMH significant for CLL, dementia, hypertension, peripheral neuropathy and chronic constipation. Found to have a large right pleural effusion completely obstraucting right lung. IR right thoracentesis yielded 1.9L of yellow fluid. Son does not know his mothers previouis diet or if she has a history of swallowing problems.  Type of Study: Bedside Swallow Evaluation Previous Swallow Assessment: none Diet Prior to this Study: NPO  Temperature Spikes Noted: No Respiratory Status: Nasal cannula History of Recent Intubation: No Behavior/Cognition: Lethargic/Drowsy Oral Cavity Assessment: Within Functional Limits Oral Care Completed by SLP: Yes Oral Cavity - Dentition: Missing dentition Vision: (not fully assessed) Self-Feeding Abilities: Needs  assist Patient Positioning: Upright in bed Baseline Vocal Quality: Normal Volitional Cough: Weak Volitional Swallow: Unable to elicit    Oral/Motor/Sensory Function Overall Oral Motor/Sensory Function: Within functional limits   Ice Chips Ice chips: Within functional limits Presentation: Spoon   Thin Liquid Thin Liquid: Within functional limits Presentation: Straw;Cup    Nectar Thick Nectar Thick Liquid: Not tested   Honey Thick Honey Thick Liquid: Not tested   Puree Puree: Within functional limits Presentation: Spoon   Solid     Solid: Impaired Presentation: Self Fed Oral Phase Impairments: Poor awareness of bolus;Reduced lingual movement/coordination      Charlynne Cousins Derrall Hicks, MA, CCC-SLP 03/15/2019 4:05 PM

## 2019-03-15 NOTE — Consult Note (Addendum)
Consultation Note Date: 03/15/2019   Patient Name: [name]  DOB: 11-19-1925  MRN: [id number]  Age / Sex: 85 y.o., female  PCP: Plotnikov, Evie Lacks, MD Referring Physician: Florencia Reasons, MD  Reason for Consultation: Establishing goals of care  HPI/Patient Profile: 85 y.o. female  with past medical history of CLL, dementia, hypertension, peripheral neuropathy, chronic constipation admitted on 03/14/2019 with failure to thrive, diminishing oral intake, declining activity.   Clinical Assessment and Goals of Care: 85 year old lady who came from [place].  She was noted to have been lethargic on presentation.  Patient's son was primary historian present at the bedside at the time of admission in the emergency department.  It was noted during initial evaluation that the patient had large right-sided pleural effusion with completely obstructing right lung.  She was seen and evaluated by interventional radiology, underwent right-sided thoracentesis with removal of 1.9 L of fluid sent for cytology, pathology pending.  She was also found to have acute/subacute pulmonary embolism.  She is on supplemental oxygen currently.  Patient admitted to hospital medicine service.  Palliative consultation for goals of care discussions.  Patient is a frail appearing elderly lady resting in bed.  She opens her eyes when her name is called.  She is able to nod her head yes/no appropriately.  Otherwise does not verbalize.  Call placed and discussed with son.  Chart reviewed.  CT scan done at the time of initial evaluation also concerning for enhancing nodularity of right pleural surfaces suggestive of metastatic disease and malignant effusion.  Palliative medicine is specialized medical care for people living with serious illness. It focuses on providing relief from the symptoms and stress of a serious illness. The goal  is to improve quality of life for both the patient and the family.  Goals of care: Broad aims of medical therapy in relation to the patient's values and preferences. Our aim is to provide medical care aimed at enabling patients to achieve the goals that matter most to them, given the circumstances of their particular medical situation and their constraints.    NEXT OF KIN Son Jori Moll  SUMMARY OF RECOMMENDATIONS   Agree with DO NOT RESUSCITATE Continue current mode of care, follow hospital course and overall disease trajectory.  Further studies from pleural fluid pending, further studies such as echo venous Doppler pending.  Speech-language evaluation also to be completed.  Monitor hospital course for considering whether or not patient would be appropriate for having hospice services as an additional layer of support at her [facility] skilled nursing facility.  Call placed and discussed with son Yukta Avino at [phone]  Code Status/Advance Care Planning:  DNR    Symptom Management:   As above  Palliative Prophylaxis:   Delirium Protocol   Psycho-social/Spiritual:   Desire for further Chaplaincy support:yes  Additional Recommendations: Caregiving  Support/Resources  Prognosis:   Unable to determine  Discharge Planning: To Be Determined      Primary Diagnoses: Present on Admission: . Failure to thrive (  0-17)   I have reviewed the medical record, interviewed the patient and family, and examined the patient. The following aspects are pertinent.  Past Medical History:  Diagnosis Date  . Allergy    rhinitis  . Chronic ethmoidal sinusitis   . Diverticulosis   . Diverticulosis of colon   . GERD (gastroesophageal reflux disease)   . Glaucoma   . Hx of colonic polyps   . Hyperlipidemia   . Hypertension   . Hypogammaglobulinemia ([place])    Monthly IVIG  . Leukemia ([place])    Chronic lymphocytic leukemia  . Osteoarthritis   . Peripheral neuropathy   . Pernicious  anemia   . UTI (lower urinary tract infection) 2010  . Vitamin B12 deficiency    Social History   Socioeconomic History  . Marital status: Widowed    Spouse name: Not on file  . Number of children: Not on file  . Years of education: Not on file  . Highest education level: Not on file  Occupational History  . Not on file  Social Needs  . Financial resource strain: Not on file  . Food insecurity    Worry: Not on file    Inability: Not on file  . Transportation needs    Medical: Not on file    Non-medical: Not on file  Tobacco Use  . Smoking status: Never Smoker  . Smokeless tobacco: Never Used  Substance and Sexual Activity  . Alcohol use: No    Alcohol/week: 0.0 standard drinks  . Drug use: No  . Sexual activity: Not Currently  Lifestyle  . Physical activity    Days per week: Not on file    Minutes per session: Not on file  . Stress: Not on file  Relationships  . Social Herbalist on phone: Not on file    Gets together: Not on file    Attends religious service: Not on file    Active member of club or organization: Not on file    Attends meetings of clubs or organizations: Not on file    Relationship status: Not on file  Other Topics Concern  . Not on file  Social History Narrative  . Not on file   Family History  Problem Relation Age of Onset  . Jaundice Father   . Stroke Son 19       in NH  . Cancer Mother        uterine  . Diabetes Mother   . Other Sister        brain tumor  . Cancer Sister   . Hypertension Other   . Cancer Other        breast  . Thyroid disease Sister   . Cancer Sister        thyroid cancer   Scheduled Meds: . bisacodyl  10 mg Rectal Daily  . gabapentin  100 mg Oral BID  . latanoprost  1 drop Both Eyes QHS  . lidocaine  1 patch Transdermal Q24H  . lip balm       Continuous Infusions: . dextrose 5% lactated ringers Stopped (03/15/19 1133)  . dextrose 5 % and 0.45% NaCl 75 mL/hr at 03/15/19 1128  . heparin 900  Units/hr (03/14/19 2043)   PRN Meds:.ipratropium-albuterol, magnesium hydroxide, ondansetron (ZOFRAN) IV Medications Prior to Admission:  Prior to Admission medications   Medication Sig Start Date End Date Taking? Authorizing Provider  acetaminophen (TYLENOL) 325 MG tablet Take 650 mg by mouth 2 (two)  times a day.   Yes [provider]  alum & mag hydroxide-simeth (MI-ACID) 200-200-20 MG/5ML suspension Take 60 mLs by mouth every 4 (four) hours as needed for indigestion or heartburn.    Yes [provider]  benzonatate (TESSALON) 100 MG capsule Take 100 mg by mouth 3 (three) times daily as needed for cough.   Yes [provider]  calcium carbonate (TUMS - DOSED IN MG ELEMENTAL CALCIUM) 500 MG chewable tablet Chew 500 mg by mouth every 4 (four) hours as needed for indigestion or heartburn.   Yes [provider]  cholecalciferol (VITAMIN D) 25 MCG (1000 UT) tablet Take 2,000 Units by mouth daily.   Yes [provider]  cromolyn (OPTICROM) 4 % ophthalmic solution Place 1 drop into both eyes 4 (four) times daily.  03/21/16  Yes [provider]  DEXILANT 30 MG capsule Take 30 mg by mouth daily. 01/28/19  Yes [provider]  fluticasone (FLONASE) 50 MCG/ACT nasal spray Place 2 sprays into both nostrils daily. 11/21/16  Yes Plotnikov, Evie Lacks, MD  folic acid (FOLVITE) 1 MG tablet Take 1 mg by mouth daily. For restless legs   Yes [provider]  gabapentin (NEURONTIN) 100 MG capsule Take 100 mg by mouth 2 (two) times a day. 02/07/19  Yes [provider]  ipratropium-albuterol (DUONEB) 0.5-2.5 (3) MG/3ML SOLN Take 3 mLs by nebulization every 6 (six) hours as needed (SOB/Wheezing).   Yes [provider]  latanoprost (XALATAN) 0.005 % ophthalmic solution PLACE 1 DROP INTO BOTH EYES AT BEDTIME. 12/31/17  Yes Plotnikov, Evie Lacks, MD  lidocaine (LIDODERM) 5 % Place 1 patch onto the skin daily. Remove & Discard patch within 12  hours or as directed by MD   Yes [provider]  loratadine (CLARITIN) 10 MG tablet TAKE 1 TABLET (10 MG TOTAL) BY MOUTH DAILY. Patient taking differently: Take 10 mg by mouth daily.  11/19/17  Yes Plotnikov, Evie Lacks, MD  magnesium hydroxide (MILK OF MAGNESIA) 400 MG/5ML suspension Take 30 mLs by mouth daily as needed for mild constipation.   Yes [provider]  Memantine HCl-Donepezil HCl (NAMZARIC) 28-10 MG CP24 Take 1 capsule by mouth daily.   Yes [provider]  mirabegron ER (MYRBETRIQ) 50 MG TB24 tablet Take 1 tablet (50 mg total) by mouth daily. 11/20/17  Yes Plotnikov, Evie Lacks, MD  Multiple Vitamins-Minerals (CERTAVITE/ANTIOXIDANTS) TABS Take 1 tablet by mouth daily.   Yes [provider]  phenol (CHLORASEPTIC) 1.4 % LIQD Use as directed 2 sprays in the mouth or throat 4 (four) times daily as needed for throat irritation / pain.    Yes [provider]  polyethylene glycol (MIRALAX / GLYCOLAX) packet Take 17 g by mouth daily. Patient taking differently: Take 17 g by mouth daily as needed for mild constipation.  12/21/17  Yes Mikhail, Maryann, DO  senna-docusate (SENOKOT-S) 8.6-50 MG tablet Take 2 tablets by mouth 2 (two) times daily. 12/23/17  Yes Patrecia Pour, Christean Grief, MD  sucralfate (CARAFATE) 1 g tablet Take 1 g by mouth 4 (four) times daily -  before meals and at bedtime. 12/30/18  Yes [provider]  traZODone (DESYREL) 50 MG tablet Take 0.5 tablets (25 mg total) by mouth at bedtime. 11/27/18  Yes Nita Sells, MD  Cholecalciferol (VITAMIN D3) 2000 units capsule Take 1 capsule (2,000 Units total) by mouth daily. Patient not taking: Reported on 03/14/2019 11/23/16   Plotnikov, Evie Lacks, MD  cyanocobalamin (,VITAMIN B-12,) 1000 MCG/ML injection  ADMINISTER 1 CC SUBQ EVERY 4 WEEKS AS DIRECTED Patient not taking: No sig reported 08/07/16   Plotnikov, Evie Lacks, MD   Allergies  Allergen Reactions  . Pneumococcal Vaccines Swelling and  Rash    Pneumococcal Vaccine-23 valent  . Clarithromycin Other (See Comments)    REACTION: sore mouth  . Hctz [Hydrochlorothiazide]     Low Na  . Oxycodone-Aspirin Other (See Comments)    REACTION: horrible nightmares  . Propoxyphene N-Acetaminophen Other (See Comments)    REACTION: couldn't wake her up   Review of Systems Opens eyes when name is called but does not verbalize Physical Exam Opens eyes when name is called, is able to nod head yes/no. Frail elderly lady resting in bed Soft abdomen not distended No edema S1-S2 Diminished breath sounds  Vital Signs: BP 100/78 (BP Location: Right Arm)   Pulse 89   Temp 99.1 F (37.3 C) (Axillary)   Resp 18   Ht 5\' 3"  (1.6 m)   Wt 59 kg   SpO2 98%   BMI 23.03 kg/m  Pain Scale: PAINAD   Pain Score: 0-No pain   SpO2: SpO2: 98 % O2 Device:SpO2: 98 % O2 Flow Rate: .O2 Flow Rate (L/min): 4 L/min  IO: Intake/output summary: No intake or output data in the 24 hours ending 03/15/19 1334  LBM:   Baseline Weight: Weight: 59 kg Most recent weight: Weight: 59 kg     Palliative Assessment/Data:    PPS 30% Time In:  11 Time Out:  12 Time Total:  60 min  Greater than 50%  of this time was spent counseling and coordinating care related to the above assessment and plan.  Signed by: Loistine Chance, MD [phone]   Please contact Palliative Medicine Team phone at [phone] for questions and concerns.  For individual provider: See Shea Evans

## 2019-03-15 NOTE — Progress Notes (Addendum)
PROGRESS NOTE  [name] [id number] DOB: 01-26-1926 DOA: 03/14/2019 PCP: Cassandria Anger, MD  PCP: Cassandria Anger, MD  Outpatient Specialists: Oncology, Dr. Benay Spice Patient coming from: [place], [place]  Chief Complaint: Failure to thrive  HPI: [name] is a 85 y.o. female with medical history significant for CLL, dementia, hypertension, peripheral neuropathy, chronic constipation who presented to [facility] ED from SNF due to failure to thrive.  Patient is unable to provide a history.  History is obtained from [place], her son in the room, and medical records.  For the past week the patient has had minimal oral intake, not eating or drinking much.  No specific complaints.  No reported fever, chills, chest pain, abdominal pain, vomiting, or diarrhea.  She is on 4 L by nasal cannula at baseline.    ED Course: Lethargic on presentation, vital signs remarkable for tachycardia with heart rate in the 110's.  Lab studies remarkable for leukocytosis with WBC 13 K.  UA pending.  Chest x-ray personally reviewed showed large right pleural effusion completely obstructing right lung.  Personally obtained consent from patient's son at bedside for thoracentesis.  IR right thoracentesis yielded 1.9 L of yellow fluid.  Fluid sent for cytology.  Highly appreciate IR assistance in the care of this patient.  TRH was asked to admit.     HPI/Recap of past 24 hours:  She is on 4liters, bp low normal , no tachycardia, t max 99  She is very lethargic, open eyes briefly to voice, does not follow commands, can not provide history  Assessment/Plan: Active Problems:   Failure to thrive (0-17)  Acute/subacute PE: CT chest on presentation showed "Filling defect of the left main pulmonary artery extending into segmental branches compatible with pulmonary embolism. Given the mural adherence of the filling defects, subacute thrombus is favored though there are no comparison studies." Heparin started  on admission with brief interruption due to needing thoracentesis Echo/venos doppler ordered , not done yet Continue heparin drip On 4liter o2, does not appear in respiratory distress  3:30pm Addendum:  RN reports "pt has been stuck X 4 today by Lab without success." Will transition from heparin drip to lovenox due to not able monitor heparin level.  Large right pleural effusion suspect malignant  Pleural effusion  -status post right thoracentesis by IR on 03/14/2019, Thoracentesis yielded 1.9 L of yellow fluid, awaiting results of cytology  Per oncology note" Question right upper lobe nodule on chest x-ray 05/09/2016; follow-up chest x-ray 09/19/2016 with more confluent irregular nodular density right upper lobe measuring 3 x 1.8 cm. repeat chest x-ray 01/09/2017-regression of irregular nodular density in the right upper lobe felt to represent interval scarring. Stable on chest x-ray 05/01/2017"  Ct chest on presentation" Right-sided pleural effusion contributes to complete volume loss of the right upper lobe and right lower lobe, with near complete volume loss of the middle lobe. Volume of fluid contributes to leftward shift of the mediastinum and heart.  Filling defect of the left main pulmonary artery extending into segmental branches compatible with pulmonary embolism. Given the mural adherence of the filling defects, subacute thrombus is favored though there are no comparison studies.  Enhancing nodularity of the right pleural surfaces suggestive of metastatic disease and malignant effusion.  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 She continues monthly IVIG replacement therapy, last received on 7/20 Followed by Dr Benay Spice   CLL, asymptomatic Followed by Dr Benay Spice   Chronic constipation Resume outpatient medications  Dementia, SNF [facility] resident   DVT Prophylaxis: on heparin drip  Code Status: DNR  Family Communication: patient   Disposition Plan: not ready to discharge   Consultants:  Palliative care  IR  Procedures:  Thoracentesis on 8/21  Antibiotics:  none   Objective: BP 100/78 (BP Location: Right Arm)    Pulse 89    Temp 99.1 F (37.3 C) (Axillary)    Resp 18    Ht 5\' 3"  (1.6 m)    Wt 59 kg    SpO2 98%    BMI 23.03 kg/m   Intake/Output Summary (Last 24 hours) at 03/15/2019 [id number] Last data filed at 03/14/2019 1207 Gross per 24 hour  Intake 500 ml  Output --  Net 500 ml   Filed Weights   03/14/19 1715  Weight: 59 kg    Exam: Patient is examined daily including today on 03/15/2019, exams remain the same as of yesterday except that has changed    General:  Lethargic, open eyes briefly to voice, does not follow commands  Cardiovascular: RRR  Respiratory: diminished,   Abdomen: Soft/ND/NT, positive BS  Musculoskeletal: No Edema  Neuro: lethargic, not following commands  Data Reviewed: Basic Metabolic Panel: Recent Labs  Lab 03/14/19 0948  NA 137  K 5.1  CL 94*  CO2 33*  GLUCOSE 143*  BUN 26*  CREATININE 0.63  CALCIUM 10.4*   Liver Function Tests: Recent Labs  Lab 03/14/19 0948  AST 26  ALT 30  ALKPHOS 106  BILITOT 0.7  PROT 7.0  ALBUMIN 2.6*   No results for input(s): LIPASE, AMYLASE in the last 168 hours. No results for input(s): AMMONIA in the last 168 hours. CBC: Recent Labs  Lab 03/14/19 0948 03/15/19 0537  WBC 13.4* 15.1*   NEUTROABS 9.7*  --   HGB 16.7* 17.1*  HCT 53.0* 53.5*  MCV 95.5 93.0  PLT 193 157   Cardiac Enzymes:   No results for input(s): CKTOTAL, CKMB, CKMBINDEX, TROPONINI in the last 168 hours. BNP (last 3 results) No results for input(s): BNP in the last 8760 hours.  ProBNP (last 3 results) No results for input(s): PROBNP in the last 8760 hours.  CBG: Recent Labs  Lab 03/14/19 2047 03/14/19 2354 03/15/19 0440  GLUCAP 148* 205* 209*    Recent Results (from the past 240 hour(s))  SARS Coronavirus 2 [facility] order, Performed in [facility] hospital lab) Nasopharyngeal Nasopharyngeal Swab     Status: None   Collection Time: 03/14/19  9:36 AM   Specimen: Nasopharyngeal Swab  Result Value Ref Range Status   SARS Coronavirus 2 NEGATIVE NEGATIVE Final    Comment: (NOTE) If result is NEGATIVE SARS-CoV-2 target nucleic acids are NOT DETECTED. The SARS-CoV-2 RNA is generally detectable in upper and lower  respiratory specimens during the acute phase of infection. The lowest  concentration of SARS-CoV-2 viral copies this assay can detect is 250  copies / mL. A negative result does not preclude SARS-CoV-2 infection  and should not be used as the sole basis for treatment or  other  patient management decisions.  A negative result may occur with  improper specimen collection / handling, submission of specimen other  than nasopharyngeal swab, presence of viral mutation(s) within the  areas targeted by this assay, and inadequate number of viral copies  (<250 copies / mL). A negative result must be combined with clinical  observations, patient history, and epidemiological information. If result is POSITIVE SARS-CoV-2 target nucleic acids are DETECTED. The SARS-CoV-2 RNA is generally detectable in upper and lower  respiratory specimens dur ing the acute phase of infection.  Positive  results are indicative of active infection with SARS-CoV-2.  Clinical  correlation with patient history and  other diagnostic information is  necessary to determine patient infection status.  Positive results do  not rule out bacterial infection or co-infection with other viruses. If result is PRESUMPTIVE POSTIVE SARS-CoV-2 nucleic acids MAY BE PRESENT.   A presumptive positive result was obtained on the submitted specimen  and confirmed on repeat testing.  While 2019 novel coronavirus  (SARS-CoV-2) nucleic acids may be present in the submitted sample  additional confirmatory testing may be necessary for epidemiological  and / or clinical management purposes  to differentiate between  SARS-CoV-2 and other Sarbecovirus currently known to infect humans.  If clinically indicated additional testing with an alternate test  methodology [id number]) is advised. The SARS-CoV-2 RNA is generally  detectable in upper and lower respiratory sp ecimens during the acute  phase of infection. The expected result is Negative. Fact Sheet for Patients:  StrictlyIdeas.no Fact Sheet for Healthcare Providers: BankingDealers.co.za This test is not yet approved or cleared by the [place] FDA and has been authorized for detection and/or diagnosis of SARS-CoV-2 by FDA under an Emergency Use Authorization (EUA).  This EUA will remain in effect (meaning this test can be used) for the duration of the COVID-19 declaration under Section 564(b)(1) of the Act, 21 U.S.C. section 360bbb-3(b)(1), unless the authorization is terminated or revoked sooner. Performed at [facility], [place] [address], [place], [place] [address]      Studies: Ct Head Wo Contrast  Result Date: 03/14/2019 CLINICAL DATA:  Encephalopathy, pulmonary embolism EXAM: CT HEAD WITHOUT CONTRAST TECHNIQUE: Contiguous axial images were obtained from the base of the skull through the vertex without intravenous contrast. COMPARISON:  CT head March 10, 2016 FINDINGS: Brain: No evidence of acute  infarction, hemorrhage, hydrocephalus, extra-axial collection or mass lesion/mass effect. Symmetric prominence of the ventricles, cisterns and sulci compatible with parenchymal volume loss. Patchy areas of white matter hypoattenuation are most compatible with chronic microvascular angiopathy. Vascular: Atherosclerotic calcification of the carotid siphons and intradural vertebral arteries. Skull: No calvarial fracture or suspicious osseous lesion. No scalp swelling or hematoma. Sinuses/Orbits: Solitary air-fluid level present in the right sphenoid sinus. Remainder of the paranasal sinuses are predominantly clear. Mastoid air cells are well aerated. Debris in the left external auditory canal. Prior lens extractions and benign scleral plaques are noted. Orbits are otherwise unremarkable. Other: None. IMPRESSION: 1. No acute intracranial abnormality. 2. Generalized parenchymal volume loss and chronic microvascular ischemic changes. 3. Intracranial atherosclerosis. 4. Solitary air-fluid level in the right sphenoid sinus. Correlate for acute sinusitis. Electronically Signed   By: Lovena Le M.D.   On: 03/14/2019 18:44   Ct Chest W Contrast  Result Date: 03/14/2019 CLINICAL DATA:  85 year old female with a pleural effusion EXAM: CT CHEST WITH CONTRAST TECHNIQUE: Multidetector CT imaging of the chest was performed during intravenous contrast administration. CONTRAST:  70mL OMNIPAQUE IOHEXOL  300 MG/ML  SOLN COMPARISON:  Chest x-ray today March 14, 2019, CT abdomen Dec 21, 2017 and Dec 16, 2017 FINDINGS: Cardiovascular: Heart: No cardiomegaly. No pericardial fluid/thickening. Coronary calcifications of the left anterior descending coronary artery. Aorta: Unremarkable course, caliber, contour of the thoracic aorta. No aneurysm or dissection flap. No periaortic fluid. Pulmonary arteries: The bolus timing is not optimized for evaluation of the pulmonary arteries. They were is a filling defect within the left main  pulmonary artery extending into the segmental branches of the left lower lobe. This was not imaged on any of the recent CT chest. Mediastinum/Nodes: Unremarkable thoracic inlet. No supraclavicular or axillary adenopathy. Unremarkable thoracic esophagus. Leftward shift of the mediastinal structures and the heart. Lungs/Pleura: Right-sided pleural effusion contributes to complete collapse of the right upper lobe. Collapse of the right lower lobe, and near complete collapse of the right middle lobe. Airspace disease within the residual aerated segment of the right middle lobe. There are enhancing soft tissue foci on the parietal pleura at the right lung apex and at the anterior pleural space, concerning for metastatic implants. Mixed linear, ground-glass, and nodular opacities of the left upper lobe and the left lower lobe, which may represent atelectasis. No evidence of left-sided pleural effusion. Upper Abdomen: No acute. Musculoskeletal: Degenerative changes of the spine. No acute displaced fracture identified. Review of the MIP images confirms the above findings. IMPRESSION: Right-sided pleural effusion contributes to complete volume loss of the right upper lobe and right lower lobe, with near complete volume loss of the middle lobe. Volume of fluid contributes to leftward shift of the mediastinum and heart. Filling defect of the left main pulmonary artery extending into segmental branches compatible with pulmonary embolism. Given the mural adherence of the filling defects, subacute thrombus is favored though there are no comparison studies. Enhancing nodularity of the right pleural surfaces suggestive of metastatic disease and malignant effusion. Coronary atherosclerosis. Electronically Signed   By: Corrie Mckusick D.O.   On: 03/14/2019 13:18   Dg Chest Port 1 View  Result Date: 03/14/2019 CLINICAL DATA:  Status post right-sided thoracentesis EXAM: PORTABLE CHEST 1 VIEW COMPARISON:  Film from earlier in the same  day. FINDINGS: There is been significant reduction in right-sided pleural effusion following thoracentesis. No pneumothorax is noted. Small residual effusion is seen. The left lung is clear. Cardiac shadow is within normal limits. Old healed right humeral fracture is noted. IMPRESSION: No pneumothorax following right-sided thoracentesis. Significant reduction in right effusion is noted. Electronically Signed   By: Inez Catalina M.D.   On: 03/14/2019 17:10   Dg Chest Portable 1 View  Result Date: 03/14/2019 CLINICAL DATA:  Failure to thrive. History of chronic lymphocytic leukemia. EXAM: PORTABLE CHEST 1 VIEW COMPARISON:  Radiographs of September 16, 2017. FINDINGS: There is now noted nearly complete opacification of the right hemithorax with mild mediastinal shift to the left. This is most consistent with pleural effusion with possible associated atelectasis. Left lung is unremarkable. Atherosclerosis of thoracic aorta is noted. No pneumothorax is noted. Bony thorax is unremarkable. IMPRESSION: Nearly complete opacification of the right hemithorax is noted with mild mediastinal shift to the right most consistent with large pleural effusion and possibly associated atelectasis. Aortic Atherosclerosis (ICD10-I70.0). Electronically Signed   By: Marijo Conception M.D.   On: 03/14/2019 10:05   US Thoracentesis Asp Pleural Space W/img Guide  Result Date: 03/14/2019 INDICATION: Patient with history of CLL, dyspnea, PE, failure to thrive, right pleural effusion. Request made for diagnostic and  therapeutic right thoracentesis. EXAM: ULTRASOUND GUIDED DIAGNOSTIC AND THERAPEUTIC RIGHT THORACENTESIS MEDICATIONS: None COMPLICATIONS: None immediate. PROCEDURE: An ultrasound guided thoracentesis was thoroughly discussed with the patient's son and questions answered. The benefits, risks, alternatives and complications were also discussed. [name] understands and wishes to proceed with the procedure. Written consent was  obtained. Ultrasound was performed to localize and mark an adequate pocket of fluid in the right chest. The area was then prepped and draped in the normal sterile fashion. 1% Lidocaine was used for local anesthesia. Under ultrasound guidance a 6 Fr Safe-T-Centesis catheter was introduced. Thoracentesis was performed. The catheter was removed and a dressing applied. FINDINGS: A total of approximately 1.9 liters of yellow fluid was removed. Samples were sent to the laboratory as requested by the clinical team. IMPRESSION: Successful ultrasound guided diagnostic and therapeutic right thoracentesis yielding 1.9 liters of pleural fluid. Read by: Rowe Robert, PA-C Electronically Signed   By: Jacqulynn Cadet M.D.   On: 03/14/2019 16:35    Scheduled Meds:  gabapentin  100 mg Oral BID   latanoprost  1 drop Both Eyes QHS   lidocaine  1 patch Transdermal Q24H    Continuous Infusions:  dextrose 5% lactated ringers 75 mL/hr at 03/14/19 2041   heparin 900 Units/hr (03/14/19 2043)     Time spent: 35 mins I have personally reviewed and interpreted on  03/15/2019 daily labs, tele strips, imagings as discussed above under date review session and assessment and plans.  I reviewed all nursing notes, pharmacy notes, consultant notes,  vitals, pertinent old records  I have discussed plan of care as described above with RN , patient  on 03/15/2019   Florencia Reasons MD, PhD, FACP  [facility] Pager [phone]. If 7PM-7AM, please contact night-coverage at [email or web address], password [facility] 03/15/2019, 8:17 AM  LOS: 1 day

## 2019-03-15 NOTE — Progress Notes (Signed)
Pt admitted with tachypnea. Pt arouses when spoken to. Closely monitoring.  MD aware.

## 2019-03-15 NOTE — Progress Notes (Signed)
Have alerted MD via text that pt has been stuck X 4 today by Lab without success. Will move labs to later time. Will continue to closely monitor pt.

## 2019-03-16 ENCOUNTER — Inpatient Hospital Stay (HOSPITAL_COMMUNITY): Payer: Medicare Other

## 2019-03-16 DIAGNOSIS — L8990 Pressure ulcer of unspecified site, unspecified stage: Secondary | ICD-10-CM | POA: Insufficient documentation

## 2019-03-16 DIAGNOSIS — I340 Nonrheumatic mitral (valve) insufficiency: Secondary | ICD-10-CM

## 2019-03-16 DIAGNOSIS — I361 Nonrheumatic tricuspid (valve) insufficiency: Secondary | ICD-10-CM

## 2019-03-16 DIAGNOSIS — I82402 Acute embolism and thrombosis of unspecified deep veins of left lower extremity: Secondary | ICD-10-CM

## 2019-03-16 LAB — ECHOCARDIOGRAM COMPLETE
Height: 63 in
Weight: 2080 oz

## 2019-03-16 LAB — URINE CULTURE

## 2019-03-16 LAB — MRSA PCR SCREENING: MRSA by PCR: POSITIVE — AB

## 2019-03-16 LAB — GLUCOSE, CAPILLARY
Glucose-Capillary: 141 mg/dL — ABNORMAL HIGH (ref 70–99)
Glucose-Capillary: 96 mg/dL (ref 70–99)

## 2019-03-16 MED ORDER — CHLORHEXIDINE GLUCONATE CLOTH 2 % EX PADS
6.0000 | MEDICATED_PAD | Freq: Every day | CUTANEOUS | Status: DC
  Administered 2019-03-17 – 2019-03-18 (×2): 6 via TOPICAL

## 2019-03-16 MED ORDER — ENSURE ENLIVE PO LIQD
237.0000 mL | Freq: Two times a day (BID) | ORAL | Status: DC
  Administered 2019-03-17 – 2019-03-18 (×2): 237 mL via ORAL

## 2019-03-16 MED ORDER — ADULT MULTIVITAMIN W/MINERALS CH
1.0000 | ORAL_TABLET | Freq: Every day | ORAL | Status: DC
  Filled 2019-03-16: qty 1

## 2019-03-16 MED ORDER — ACETAMINOPHEN 325 MG PO TABS
650.0000 mg | ORAL_TABLET | Freq: Four times a day (QID) | ORAL | Status: DC | PRN
  Administered 2019-03-16: 650 mg via ORAL
  Filled 2019-03-16: qty 2

## 2019-03-16 MED ORDER — BOOST / RESOURCE BREEZE PO LIQD CUSTOM
1.0000 | ORAL | Status: DC
  Administered 2019-03-17 – 2019-03-19 (×3): 1 via ORAL

## 2019-03-16 MED ORDER — SODIUM CHLORIDE 0.9 % IV SOLN
INTRAVENOUS | Status: AC
  Administered 2019-03-16 (×2): via INTRAVENOUS

## 2019-03-16 MED ORDER — MUPIROCIN 2 % EX OINT
1.0000 "application " | TOPICAL_OINTMENT | Freq: Two times a day (BID) | CUTANEOUS | Status: DC
  Administered 2019-03-16 – 2019-03-19 (×7): 1 via NASAL
  Filled 2019-03-16 (×2): qty 22

## 2019-03-16 NOTE — Progress Notes (Signed)
  Echocardiogram 2D Echocardiogram has been performed.  Randa Lynn [place] Luppino 03/16/2019, 9:34 AM

## 2019-03-16 NOTE — Progress Notes (Signed)
Initial Nutrition Assessment  RD working remotely.   DOCUMENTATION CODES:   (unable to assess for malnutrition at this time.)  INTERVENTION:  - will order Boost Breeze once/day, each supplement provides 250 kcal and 9 grams of protein. - will order Ensure Enlive BID, each supplement provides 350 kcal and 20 grams of protein. - will order daily multivitamin with minerals. - continue to encourage PO intakes.    NUTRITION DIAGNOSIS:   Inadequate oral intake related to acute illness, poor appetite as evidenced by per patient/family report.  GOAL:   Patient will meet greater than or equal to 90% of their needs  MONITOR:   PO intake, Supplement acceptance, Labs, Weight trends, Skin  REASON FOR ASSESSMENT:   Consult Assessment of nutrition requirement/status  ASSESSMENT:   85 y.o. female with medical history significant for CLL, dementia, HTN, peripheral neuropathy, and chronic constipation. She presented to the ED from SNF on 8/21 due to FTT. For the week PTA she had minimal oral intake, not eating or drinking much. No specific complaints. No reported fever, chills, chest pain, abdominal pain, vomiting, or diarrhea. She is on 4 L by nasal cannula at baseline.  Diet advanced from NPO to Dysphagia 1, thin liquids today at 1206 with no intakes documented since that time. Flow sheet indicates that patient is a/o to self only. Per chart review, current weight is 130 lb and weight on 7/20 was 155 lb which would indicate 25 lb weight loss (16% body weight) in the past 1 month; significant for time frame if accurate.   Per notes: - acute/subacute PE--large, right-sided, suspicion for malignancy - R thoracentesis on 8/21--1.9L yellow fluid removed, awaiting results of cytology - leukocytosis -FTT/dehydration/lethargy/acute metabolic encephalopathy - chronic constipation   Labs reviewed; CBG: 141 mg/dl today, 8/21--Cl: 94 mmol/l, BUN: 26 mg/dl, Ca: 10.4 mg/dl. Medications  reviewed. IVF; NS @ 75 ml/hr.     NUTRITION - FOCUSED PHYSICAL EXAM:  unable to complete at this time.   Diet Order:   Diet Order            DIET - DYS 1 Room service appropriate? Yes; Fluid consistency: Thin  Diet effective now              EDUCATION NEEDS:   No education needs have been identified at this time  Skin:  Skin Assessment: Skin Integrity Issues: Skin Integrity Issues:: Stage II Stage II: sacrum  Last BM:  8/23  Height:   Ht Readings from Last 1 Encounters:  03/14/19 5\' 3"  (1.6 m)    Weight:   Wt Readings from Last 1 Encounters:  03/14/19 59 kg    Ideal Body Weight:  52.3 kg  BMI:  Body mass index is 23.03 kg/m.  Estimated Nutritional Needs:   Kcal:  1475-1650 kcal  Protein:  55-65 grams  Fluid:  >/= 1.8 L/day     Melissa Matin, MS, RD, LDN, [facility] Inpatient Clinical Dietitian Pager # [phone] After hours/weekend pager # [phone]

## 2019-03-16 NOTE — Progress Notes (Addendum)
PROGRESS NOTE  [name] [id number] DOB: [birth year] DOA: 03/14/2019 PCP: Cassandria Anger, MD  PCP: Cassandria Anger, MD  Outpatient Specialists: Oncology, Dr. Benay Spice Patient coming from: [place], [place]  Chief Complaint: Failure to thrive  HPI: [name] is a 85 y.o. female with medical history significant for CLL, dementia, hypertension, peripheral neuropathy, chronic constipation who presented to [facility] ED from SNF due to failure to thrive.  Patient is unable to provide a history.  History is obtained from [place], her son in the room, and medical records.  For the past week the patient has had minimal oral intake, not eating or drinking much.  No specific complaints.  No reported fever, chills, chest pain, abdominal pain, vomiting, or diarrhea.  She is on 4 L by nasal cannula at baseline.    ED Course: Lethargic on presentation, vital signs remarkable for tachycardia with heart rate in the 110's.  Lab studies remarkable for leukocytosis with WBC 13 K.  UA pending.  Chest x-ray personally reviewed showed large right pleural effusion completely obstructing right lung.  Personally obtained consent from patient's son at bedside for thoracentesis.  IR right thoracentesis yielded 1.9 L of yellow fluid.  Fluid sent for cytology.  Highly appreciate IR assistance in the care of this patient.  TRH was asked to admit.     HPI/Recap of past 24 hours:   She remain very weak and lethargic but improved some compare to yesterday, she is able to answer simple questions She  is on 4liters at rest, bp stable, no tachycardia, tmax99.4 Son at bedside   Assessment/Plan: Active Problems:   CLL (chronic lymphocytic leukemia) (HCC)   Failure to thrive (0-17)   Failure to thrive in adult   Pleural effusion   Other pulmonary embolism without acute cor pulmonale (HCC)   Chronic respiratory failure with hypoxia (HCC)   Hypoglobulinemia   Immunosuppressed status ([place])  Acute/subacute  PE/acute DVT left lower extremity: -CT chest on presentation showed "Filling defect of the left main pulmonary artery extending into segmental branches compatible with pulmonary embolism. Given the mural adherence of the filling defects, subacute thrombus is favored though there are no comparison studies." -Echo lvef wnl, Right Ventricle: The right ventricle has mildly reduced systolic function. The cavity was mildly enlarged. There is no increase in right ventricular wall thickness. Right ventricular systolic pressure is mildly elevated with an estimated pressure of 34.4  mmHg. -venous doppler "acute deep vein thrombosis involving the left, external iliac vein, common femoral vein, left femoral vein, and left proximal profunda vein -Heparin started on admission with brief interruption due to needing thoracentesis, due to poor iv access, changed to lovenox -On 4liter o2, does not appear in respiratory distress   Large right pleural effusion suspect malignant  Pleural effusion  -status post right thoracentesis by IR on 03/14/2019, Thoracentesis yielded 1.9 L of yellow fluid, awaiting results of cytology -pleural fluids culture in process, no organisms seen on gram stain   Ct chest on presentation" Right-sided pleural effusion contributes to complete volume loss of the right upper lobe and right lower lobe, with near complete volume loss of the middle lobe. Volume of fluid contributes to leftward shift of the mediastinum and heart.  Filling defect of the left main pulmonary artery extending into segmental branches compatible with pulmonary embolism. Given the mural adherence of the filling defects, subacute thrombus is favored though there are no comparison studies.  Enhancing nodularity of the right pleural surfaces suggestive of  metastatic disease and malignant effusion.  Coronary atherosclerosis." Per oncology note" Question right upper lobe nodule on chest x-ray 05/09/2016; follow-up chest  x-ray 09/19/2016 with more confluent irregular nodular density right upper lobe measuring 3 x 1.8 cm. repeat chest x-ray 01/09/2017-regression of irregular nodular density in the right upper lobe felt to represent interval scarring. Stable on chest x-ray 05/01/2017"     Chronic hypoxic respiratory failure She is on 4 L oxygen supplementation at baseline  Leukocytosis with elevated hemoglobin , likely from dehydration  On hydration Blood culture no growth Add on pleural fluids culture and gram stain  UA clear, urine culture with multiple, nonpredominant   Will hold off abx, repeat cbc in am  acute metabolic encephalopathy -on hydration , treating underline acute issues, rule out infection, slightly more alert today -she more awake on 8/23, speech recommends  Dysphagia 1 (Puree);Thin liquid   Liquid Administration via: Cup;Straw Medication Administration: Whole meds with puree Supervision: Full supervision/cueing for compensatory strategies Compensations: Minimize environmental distractions;Slow rate;Small sips/bites(feed only when fully alert/awake) Postural Changes: Seated upright at 90 degrees  -palliative care consulted for goals of care  HTN: bp soft, hold home bp meds  Hypogammaglobulinemia /immunosuppressed status with recurrent infections (UTIs). She continues monthly IVIG replacement therapy, last received on 7/20 Followed by Dr Benay Spice   CLL, asymptomatic Followed by Dr Benay Spice   Chronic constipation Resume outpatient medications  FTT/ Dementia, SNF [facility] resident Son reports he last saw patient two months ago, patient was able to walk with a walker two months ago I have discussed with the son if patient does not improve in the next 1-2 days , may consider to proceed with hospice, son agrees to the plan to care.  Palliative care on board, appreciate input  Stage II sacral decubitus ulcer, presents on admission Skin care   DVT Prophylaxis: on  therapeutic lovenox  Code Status: DNR  Family Communication: patient and son at bedside   Disposition Plan: not ready to discharge, snf with palliative care vs residential hospice pending clinical course, pleural fluids cytology    Consultants:  Palliative care  IR  Procedures:  Thoracentesis on 8/21  Antibiotics:  none   Objective: BP 137/84 (BP Location: Left Arm)   Pulse 76   Temp 98.2 F (36.8 C) (Oral)   Resp 16   Ht 5\' 3"  (1.6 m)   Wt 59 kg   SpO2 94%   BMI 23.03 kg/m   Intake/Output Summary (Last 24 hours) at 03/16/2019 0856 Last data filed at 03/16/2019 0549 Gross per 24 hour  Intake 1562.2 ml  Output 400 ml  Net 1162.2 ml   Filed Weights   03/14/19 1715  Weight: 59 kg    Exam: Patient is examined daily including today on 03/16/2019, exams remain the same as of yesterday except that has changed    General:  More alert, able to answer simple yes/no questions  Cardiovascular: RRR  Respiratory: diminished,   Abdomen: Soft/ND/NT, positive BS  Musculoskeletal: No Edema  Neuro: More alert, able to answer simple yes/no questions  Data Reviewed: Basic Metabolic Panel: Recent Labs  Lab 03/14/19 0948  NA 137  K 5.1  CL 94*  CO2 33*  GLUCOSE 143*  BUN 26*  CREATININE 0.63  CALCIUM 10.4*   Liver Function Tests: Recent Labs  Lab 03/14/19 0948  AST 26  ALT 30  ALKPHOS 106  BILITOT 0.7  PROT 7.0  ALBUMIN 2.6*   No results for input(s): LIPASE, AMYLASE in the  last 168 hours. No results for input(s): AMMONIA in the last 168 hours. CBC: Recent Labs  Lab 03/14/19 0948 03/15/19 0537  WBC 13.4* 15.1*  NEUTROABS 9.7*  --   HGB 16.7* 17.1*  HCT 53.0* 53.5*  MCV 95.5 93.0  PLT 193 157   Cardiac Enzymes:   No results for input(s): CKTOTAL, CKMB, CKMBINDEX, TROPONINI in the last 168 hours. BNP (last 3 results) No results for input(s): BNP in the last 8760 hours.  ProBNP (last 3 results) No results for input(s): PROBNP in the last  8760 hours.  CBG: Recent Labs  Lab 03/15/19 0440 03/15/19 1110 03/15/19 1631 03/15/19 2042 03/16/19 0731  GLUCAP 209* 218* 193* 204* 141*    Recent Results (from the past 240 hour(s))  SARS Coronavirus 2 [facility] order, Performed in [facility] hospital lab) Nasopharyngeal Nasopharyngeal Swab     Status: None   Collection Time: 03/14/19  9:36 AM   Specimen: Nasopharyngeal Swab  Result Value Ref Range Status   SARS Coronavirus 2 NEGATIVE NEGATIVE Final    Comment: (NOTE) If result is NEGATIVE SARS-CoV-2 target nucleic acids are NOT DETECTED. The SARS-CoV-2 RNA is generally detectable in upper and lower  respiratory specimens during the acute phase of infection. The lowest  concentration of SARS-CoV-2 viral copies this assay can detect is 250  copies / mL. A negative result does not preclude SARS-CoV-2 infection  and should not be used as the sole basis for treatment or other  patient management decisions.  A negative result may occur with  improper specimen collection / handling, submission of specimen other  than nasopharyngeal swab, presence of viral mutation(s) within the  areas targeted by this assay, and inadequate number of viral copies  (<250 copies / mL). A negative result must be combined with clinical  observations, patient history, and epidemiological information. If result is POSITIVE SARS-CoV-2 target nucleic acids are DETECTED. The SARS-CoV-2 RNA is generally detectable in upper and lower  respiratory specimens dur ing the acute phase of infection.  Positive  results are indicative of active infection with SARS-CoV-2.  Clinical  correlation with patient history and other diagnostic information is  necessary to determine patient infection status.  Positive results do  not rule out bacterial infection or co-infection with other viruses. If result is PRESUMPTIVE POSTIVE SARS-CoV-2 nucleic acids MAY BE PRESENT.   A presumptive positive result was obtained on the  submitted specimen  and confirmed on repeat testing.  While 2019 novel coronavirus  (SARS-CoV-2) nucleic acids may be present in the submitted sample  additional confirmatory testing may be necessary for epidemiological  and / or clinical management purposes  to differentiate between  SARS-CoV-2 and other Sarbecovirus currently known to infect humans.  If clinically indicated additional testing with an alternate test  methodology [phone]) is advised. The SARS-CoV-2 RNA is generally  detectable in upper and lower respiratory sp ecimens during the acute  phase of infection. The expected result is Negative. Fact Sheet for Patients:  StrictlyIdeas.no Fact Sheet for Healthcare Providers: BankingDealers.co.za This test is not yet approved or cleared by the [place] FDA and has been authorized for detection and/or diagnosis of SARS-CoV-2 by FDA under an Emergency Use Authorization (EUA).  This EUA will remain in effect (meaning this test can be used) for the duration of the COVID-19 declaration under Section 564(b)(1) of the Act, 21 U.S.C. section 360bbb-3(b)(1), unless the authorization is terminated or revoked sooner. Performed at [facility], [address] [place],  [place] [address]   Culture, blood (routine x 2)     Status: None (Preliminary result)   Collection Time: 03/14/19  6:44 PM   Specimen: BLOOD  Result Value Ref Range Status   Specimen Description   Final    BLOOD RIGHT ANTECUBITAL Performed at [facility] [address] [place], [place] [address]    Special Requests   Final    BOTTLES DRAWN AEROBIC AND ANAEROBIC Blood Culture adequate volume Performed at [facility] [address], [place], [place] [address]    Culture   Final    NO GROWTH 2 DAYS Performed at [facility] [address] [place], [place] [address]    Report Status PENDING   Incomplete  Culture, blood (routine x 2)     Status: None (Preliminary result)   Collection Time: 03/14/19  6:44 PM   Specimen: BLOOD  Result Value Ref Range Status   Specimen Description   Final    BLOOD LEFT ANTECUBITAL Performed at [facility] [address], [place], [place] [address]    Special Requests   Final    BOTTLES DRAWN AEROBIC AND ANAEROBIC Blood Culture adequate volume Performed at [facility] [address], [place], [place] [address]    Culture   Final    NO GROWTH 2 DAYS Performed at [facility] [address] [place], [place] [address]    Report Status PENDING  Incomplete  Body fluid culture     Status: None (Preliminary result)   Collection Time: 03/15/19 11:04 AM   Specimen: Pleura; Body Fluid  Result Value Ref Range Status   Specimen Description   Final    PLEURAL RIGHT Performed at [facility] [address] [place], [place] [address]    Special Requests   Final    Immunocompromised Performed at [facility], [place] [address], [place], [place] [address]    Gram Stain   Final    RARE WBC PRESENT, PREDOMINANTLY MONONUCLEAR NO ORGANISMS SEEN Performed at [facility], [place] [address] [place], [place] [address]    Culture PENDING  Incomplete   Report Status PENDING  Incomplete     Studies: Vas [place] Lower Extremity Venous (dvt)  Result Date: 03/15/2019  Lower Venous Study Indications: Pulmonary embolism.  Limitations: Patient confusion and inability to follow commands. Comparison Study: No prior study on file Performing Technologist: Sharion Dove RVS  Examination Guidelines: A complete evaluation includes B-mode imaging, spectral Doppler, color Doppler, and power Doppler as needed of all accessible portions of each vessel. Bilateral testing is considered an integral part of a complete examination. Limited examinations for reoccurring indications may be performed  as noted.  +---------+---------------+---------+-----------+----------+--------------+ RIGHT    CompressibilityPhasicitySpontaneityPropertiesThrombus Aging +---------+---------------+---------+-----------+----------+--------------+ CFV      Full           Yes      Yes                                 +---------+---------------+---------+-----------+----------+--------------+ SFJ      Full                                                        +---------+---------------+---------+-----------+----------+--------------+  FV Prox  Full                                                        +---------+---------------+---------+-----------+----------+--------------+ FV Mid   Full                                                        +---------+---------------+---------+-----------+----------+--------------+ FV DistalFull                                                        +---------+---------------+---------+-----------+----------+--------------+ PFV      Full                                                        +---------+---------------+---------+-----------+----------+--------------+ POP      Full           Yes      Yes                                 +---------+---------------+---------+-----------+----------+--------------+ PTV      Full                                                        +---------+---------------+---------+-----------+----------+--------------+ PERO     Full                                                        +---------+---------------+---------+-----------+----------+--------------+   +---------+---------------+---------+-----------+----------+-------------------+ LEFT     CompressibilityPhasicitySpontaneityPropertiesThrombus Aging      +---------+---------------+---------+-----------+----------+-------------------+ CFV      Partial        No       No                   Acute                +---------+---------------+---------+-----------+----------+-------------------+ SFJ      Partial                                                          +---------+---------------+---------+-----------+----------+-------------------+ FV Prox  None                                                             +---------+---------------+---------+-----------+----------+-------------------+  FV Mid   None                                                             +---------+---------------+---------+-----------+----------+-------------------+ FV DistalNone                                                             +---------+---------------+---------+-----------+----------+-------------------+ PFV      None                                                             +---------+---------------+---------+-----------+----------+-------------------+ POP                                                   Not visualized      +---------+---------------+---------+-----------+----------+-------------------+ PTV                                                   visualized by color +---------+---------------+---------+-----------+----------+-------------------+ PERO                                                  Not visualized      +---------+---------------+---------+-----------+----------+-------------------+ EIV                     No       No                   Acute               +---------+---------------+---------+-----------+----------+-------------------+ CIV                     Yes      Yes                                      +---------+---------------+---------+-----------+----------+-------------------+   Left Technical Findings: Not visualized segments include popliteal, peroneal, not all segments of the posterior tibial.   Summary: Right: There is no evidence of deep vein thrombosis in the lower extremity. Left: Findings consistent with acute  deep vein thrombosis involving the left, external iliac vein, common femoral vein, left femoral vein, and left proximal profunda vein.  *See table(s) above for measurements and observations.    Preliminary     Scheduled Meds: . bisacodyl  10 mg Rectal Daily  . enoxaparin (LOVENOX) injection  1 mg/kg Subcutaneous Q12H  . gabapentin  100 mg Oral  BID  . latanoprost  1 drop Both Eyes QHS  . lidocaine  1 patch Transdermal Q24H    Continuous Infusions: . sodium chloride    . dextrose 5 % and 0.45% NaCl 75 mL/hr at 03/16/19 0515     Time spent: 35 mins I have personally reviewed and interpreted on  03/16/2019 daily labs,  imagings as discussed above under date review session and assessment and plans.  I reviewed all nursing notes, pharmacy notes, consultant notes,  vitals, pertinent old records  I have discussed plan of care as described above with RN , patient and son at bedside on 03/16/2019   Florencia Reasons MD, PhD, [facility] Pager [phone]. If 7PM-7AM, please contact night-coverage at [email or web address], password [facility] 03/16/2019, 8:56 AM  LOS: 2 days

## 2019-03-16 NOTE — Progress Notes (Addendum)
PMT progress note  Patient appears more alert, she is reported to have been complaining of abdominal discomfort, found to have skin breakdown, present on admission. ECHO done, seen by SLP, cleared for dysphagia 1 and thin liquids. Son also at bedside.   BP 111/65 (BP Location: Right Arm)   Pulse 83   Temp 99.4 F (37.4 C) (Oral)   Resp 16   Ht 5\' 3"  (1.6 m)   Wt 59 kg   SpO2 98%   BMI 23.03 kg/m  Labs and imaging noted Discussed with bedside RNs  Awake more alert abd pain Has dementia Frail lady Regular S1 S2  PPS 30%  85 yo lady from [facility], admitted with PE, pleural effusion, has history of CLL.  Continue to encourage PO PO PRN tylenol for pain May need palliative care to follow her at SNF upon discharge.  No additional PMT specific recommendations at this time.  Greater than 50%  of this time was spent counseling and coordinating care related to the above assessment and plan.  25 minutes spent [facility] palliative team [phone]

## 2019-03-17 DIAGNOSIS — I2699 Other pulmonary embolism without acute cor pulmonale: Secondary | ICD-10-CM

## 2019-03-17 DIAGNOSIS — I82409 Acute embolism and thrombosis of unspecified deep veins of unspecified lower extremity: Secondary | ICD-10-CM

## 2019-03-17 LAB — CBC WITH DIFFERENTIAL/PLATELET
Abs Immature Granulocytes: 0.16 10*3/uL — ABNORMAL HIGH (ref 0.00–0.07)
Basophils Absolute: 0 10*3/uL (ref 0.0–0.1)
Basophils Relative: 0 %
Eosinophils Absolute: 0 10*3/uL (ref 0.0–0.5)
Eosinophils Relative: 0 %
HCT: 45.4 % (ref 36.0–46.0)
Hemoglobin: 14.4 g/dL (ref 12.0–15.0)
Immature Granulocytes: 1 %
Lymphocytes Relative: 16 %
Lymphs Abs: 2.1 10*3/uL (ref 0.7–4.0)
MCH: 30.2 pg (ref 26.0–34.0)
MCHC: 31.7 g/dL (ref 30.0–36.0)
MCV: 95.2 fL (ref 80.0–100.0)
Monocytes Absolute: 0.7 10*3/uL (ref 0.1–1.0)
Monocytes Relative: 6 %
Neutro Abs: 10.2 10*3/uL — ABNORMAL HIGH (ref 1.7–7.7)
Neutrophils Relative %: 77 %
Platelets: 147 10*3/uL — ABNORMAL LOW (ref 150–400)
RBC: 4.77 MIL/uL (ref 3.87–5.11)
RDW: 14.7 % (ref 11.5–15.5)
WBC: 13.3 10*3/uL — ABNORMAL HIGH (ref 4.0–10.5)
nRBC: 0 % (ref 0.0–0.2)

## 2019-03-17 LAB — BASIC METABOLIC PANEL
Anion gap: 11 (ref 5–15)
BUN: 12 mg/dL (ref 8–23)
CO2: 27 mmol/L (ref 22–32)
Calcium: 9.3 mg/dL (ref 8.9–10.3)
Chloride: 99 mmol/L (ref 98–111)
Creatinine, Ser: 0.35 mg/dL — ABNORMAL LOW (ref 0.44–1.00)
GFR calc Af Amer: >60 mL/min (ref >60)
GFR calc non Af Amer: >60 mL/min (ref >60)
Glucose, Bld: 81 mg/dL (ref 70–99)
Potassium: 4.1 mmol/L (ref 3.5–5.1)
Sodium: 137 mmol/L (ref 135–145)

## 2019-03-17 LAB — PH, BODY FLUID: pH, Body Fluid: 7.4

## 2019-03-17 LAB — MAGNESIUM: Magnesium: 2 mg/dL (ref 1.7–2.4)

## 2019-03-17 NOTE — Progress Notes (Signed)
PROGRESS NOTE  [name] [id number] DOB: 1926/03/13 DOA: 03/14/2019 PCP: Cassandria Anger, MD  PCP: Cassandria Anger, MD  Outpatient Specialists: Oncology, Dr. Benay Spice Patient coming from: [place], [place]  Chief Complaint: Failure to thrive  HPI: [name] is a 85 y.o. female with medical history significant for CLL, dementia, hypertension, peripheral neuropathy, chronic constipation who presented to [facility] ED from SNF due to failure to thrive.  Patient is unable to provide a history.  History is obtained from [place], her son in the room, and medical records.  For the past week the patient has had minimal oral intake, not eating or drinking much.  No specific complaints.  No reported fever, chills, chest pain, abdominal pain, vomiting, or diarrhea.  She is on 4 L by nasal cannula at baseline.    ED Course: Lethargic on presentation, vital signs remarkable for tachycardia with heart rate in the 110's.  Lab studies remarkable for leukocytosis with WBC 13 K.  UA pending.  Chest x-ray personally reviewed showed large right pleural effusion completely obstructing right lung.  Personally obtained consent from patient's son at bedside for thoracentesis.  IR right thoracentesis yielded 1.9 L of yellow fluid.  Fluid sent for cytology.  Highly appreciate IR assistance in the care of this patient.  TRH was asked to admit.     HPI/Recap of past 24 hours: She remain weak, she is able to answer simple questions  Assessment/Plan: Principal Problem:   Deep vein thrombosis (DVT) with pulmonary embolism present on admission [facility]) Active Problems:   CLL (chronic lymphocytic leukemia) (HCC)   Failure to thrive (0-17)   Failure to thrive in adult   Pleural effusion   Other pulmonary embolism without acute cor pulmonale (HCC)   Chronic respiratory failure with hypoxia (HCC)   Hypoglobulinemia   Immunosuppressed status (HCC)   Pressure injury of skin   Acute deep vein thrombosis (DVT)  of left lower extremity ([place])  # Acute/subacute PE/acute DVT left lower extremity: -CT chest on presentation showed "Filling defect of the left main pulmonary artery extending into segmental branches compatible with pulmonary embolism. Given the mural adherence of the filling defects, subacute thrombus is favored though there are no comparison studies." -Echo lvef wnl, Right Ventricle: The right ventricle has mildly reduced systolic function. The cavity was mildly enlarged. There is no increase in right ventricular wall thickness. Right ventricular systolic pressure is mildly elevated with an estimated pressure of 34.4  mmHg. -venous doppler "acute deep vein thrombosis involving the left, external iliac vein, common femoral vein, left femoral vein, and left proximal profunda vein -IV Heparin started on admission with brief interruption due to needing thoracentesis, due to poor iv access have changed to lovenox SQ -On 4liter o2, does not appear in respiratory distress   # Large right pleural effusion suspect malignant  Pleural effusion  -status post right thoracentesis by IR on 03/14/2019  - Thoracentesis yielded 1.9 L of yellow fluid,   -  awaiting results of cytology  -  Pleural fluid culture is negative, just returned this afternoon   Ct chest on presentation" Right-sided pleural effusion contributes to complete volume loss of the right upper lobe and right lower lobe, with near complete volume loss of the middle lobe. Volume of fluid contributes to leftward shift of the mediastinum and heart.  Filling defect of the left main pulmonary artery extending into segmental branches compatible with pulmonary embolism. Given the mural adherence of the filling defects, subacute thrombus  is favored though there are no comparison studies.  Enhancing nodularity of the right pleural surfaces suggestive of metastatic disease and malignant effusion.  Coronary atherosclerosis." Per oncology note"  Question right upper lobe nodule on chest x-ray 05/09/2016; follow-up chest x-ray 09/19/2016 with more confluent irregular nodular density right upper lobe measuring 3 x 1.8 cm. repeat chest x-ray 01/09/2017-regression of irregular nodular density in the right upper lobe felt to represent interval scarring. Stable on chest x-ray 05/01/2017"     # Chronic hypoxic respiratory failure She is on 4 L oxygen supplementation at baseline  # Leukocytosis with elevated hemoglobin , likely from dehydration  On hydration Blood culture no growth Add on pleural fluids culture and gram stain  UA clear, urine culture with multiple, nonpredominant   Will hold off abx, repeat cbc in am  # Acute metabolic encephalopathy -on hydration , treating underline acute issues, rule out infection, slightly more alert today -she more awake on 8/23, speech recommends >>  Dysphagia 1 (Puree);Thin liquid  Liquid Administration via: Cup;Straw Medication Administration: Whole meds with puree Supervision: Full supervision/cueing for compensatory strategies Compensations: Minimize environmental distractions;Slow rate;Small sips/bites(feed only when fully alert/awake) Postural Changes: Seated upright at 90 degrees  -palliative care consulted for goals of care, they are aware  HTN: bp soft, holding home bp meds  Hypogammaglobulinemia /immunosuppressed status: with recurrent infections (UTIs). She continues monthly IVIG replacement therapy, last received on 7/20 Followed by Dr Benay Spice   CLL, asymptomatic Followed by Dr Benay Spice   Chronic constipation Resume outpatient medications  FTT/ Dementia- is DNR; is a  SNF [place] resident Son reports he last saw patient two months ago, patient was able to walk with a walker two months ago Palliative care is having ongoing discussing re: options w/ son including hospice at [facility] and/or  SNF w/ palliative   Stage II sacral decubitus ulcer, presents on admission Skin  care   DVT Prophylaxis: on therapeutic lovenox  Code Status: DNR  Family Communication: patient and son at bedside   Disposition Plan: defer to [place] regarding when ready for discharge medically  Labs: lab holiday tomorrow   Consultants:  Palliative care  IR  Procedures:  Thoracentesis on 8/21  Antibiotics:  none   Objective: BP 122/71 (BP Location: Right Arm)    Pulse 83    Temp 98 F (36.7 C) (Oral)    Resp 16    Ht 5\' 3"  (1.6 m)    Wt 59 kg    SpO2 96%    BMI 23.03 kg/m   Intake/Output Summary (Last 24 hours) at 03/17/2019 1408 Last data filed at 03/17/2019 1314 Gross per 24 hour  Intake 1738.54 ml  Output 400 ml  Net 1338.54 ml   Filed Weights   03/14/19 1715  Weight: 59 kg    Exam: Patient is examined daily including today on 03/17/2019, exams remain the same as of yesterday except that has changed    General:  More alert, able to answer simple yes/no questions  Cardiovascular: RRR  Respiratory: diminished,   Abdomen: Soft/ND/NT, positive BS  Musculoskeletal: No Edema  Neuro: More alert, able to answer simple yes/no questions  Data Reviewed: Basic Metabolic Panel: Recent Labs  Lab 03/14/19 0948 03/17/19 0648  NA 137 137  K 5.1 4.1  CL 94* 99  CO2 33* 27  GLUCOSE 143* 81  BUN 26* 12  CREATININE 0.63 0.35*  CALCIUM 10.4* 9.3  MG  --  2.0   Liver Function Tests: Recent Labs  Lab 03/14/19 0948  AST 26  ALT 30  ALKPHOS 106  BILITOT 0.7  PROT 7.0  ALBUMIN 2.6*   No results for input(s): LIPASE, AMYLASE in the last 168 hours. No results for input(s): AMMONIA in the last 168 hours. CBC: Recent Labs  Lab 03/14/19 0948 03/15/19 0537 03/17/19 0648  WBC 13.4* 15.1* 13.3*  NEUTROABS 9.7*  --  10.2*  HGB 16.7* 17.1* 14.4  HCT 53.0* 53.5* 45.4  MCV 95.5 93.0 95.2  PLT 193 157 147*   Cardiac Enzymes:   No results for input(s): CKTOTAL, CKMB, CKMBINDEX, TROPONINI in the last 168 hours. BNP (last 3 results) No results for  input(s): BNP in the last 8760 hours.  ProBNP (last 3 results) No results for input(s): PROBNP in the last 8760 hours.  CBG: Recent Labs  Lab 03/15/19 1110 03/15/19 1631 03/15/19 2042 03/16/19 0731 03/16/19 1652  GLUCAP 218* 193* 204* 141* 96    Recent Results (from the past 240 hour(s))  SARS Coronavirus 2 [facility] order, Performed in [facility] hospital lab) Nasopharyngeal Nasopharyngeal Swab     Status: None   Collection Time: 03/14/19  9:36 AM   Specimen: Nasopharyngeal Swab  Result Value Ref Range Status   SARS Coronavirus 2 NEGATIVE NEGATIVE Final    Comment: (NOTE) If result is NEGATIVE SARS-CoV-2 target nucleic acids are NOT DETECTED. The SARS-CoV-2 RNA is generally detectable in upper and lower  respiratory specimens during the acute phase of infection. The lowest  concentration of SARS-CoV-2 viral copies this assay can detect is 250  copies / mL. A negative result does not preclude SARS-CoV-2 infection  and should not be used as the sole basis for treatment or other  patient management decisions.  A negative result may occur with  improper specimen collection / handling, submission of specimen other  than nasopharyngeal swab, presence of viral mutation(s) within the  areas targeted by this assay, and inadequate number of viral copies  (<250 copies / mL). A negative result must be combined with clinical  observations, patient history, and epidemiological information. If result is POSITIVE SARS-CoV-2 target nucleic acids are DETECTED. The SARS-CoV-2 RNA is generally detectable in upper and lower  respiratory specimens dur ing the acute phase of infection.  Positive  results are indicative of active infection with SARS-CoV-2.  Clinical  correlation with patient history and other diagnostic information is  necessary to determine patient infection status.  Positive results do  not rule out bacterial infection or co-infection with other viruses. If result is  PRESUMPTIVE POSTIVE SARS-CoV-2 nucleic acids MAY BE PRESENT.   A presumptive positive result was obtained on the submitted specimen  and confirmed on repeat testing.  While 2019 novel coronavirus  (SARS-CoV-2) nucleic acids may be present in the submitted sample  additional confirmatory testing may be necessary for epidemiological  and / or clinical management purposes  to differentiate between  SARS-CoV-2 and other Sarbecovirus currently known to infect humans.  If clinically indicated additional testing with an alternate test  methodology [phone]) is advised. The SARS-CoV-2 RNA is generally  detectable in upper and lower respiratory sp ecimens during the acute  phase of infection. The expected result is Negative. Fact Sheet for Patients:  StrictlyIdeas.no Fact Sheet for Healthcare Providers: BankingDealers.co.za This test is not yet approved or cleared by the [place] FDA and has been authorized for detection and/or diagnosis of SARS-CoV-2 by FDA under an Emergency Use Authorization (EUA).  This EUA will remain in effect (meaning this test  can be used) for the duration of the COVID-19 declaration under Section 564(b)(1) of the Act, 21 U.S.C. section 360bbb-3(b)(1), unless the authorization is terminated or revoked sooner. Performed at [facility], [place] [address], [place], [place] [address]   Culture, blood (routine x 2)     Status: None (Preliminary result)   Collection Time: 03/14/19  6:44 PM   Specimen: BLOOD  Result Value Ref Range Status   Specimen Description   Final    BLOOD RIGHT ANTECUBITAL Performed at [place] [address] [place], [place] [address]    Special Requests   Final    BOTTLES DRAWN AEROBIC AND ANAEROBIC Blood Culture adequate volume Performed at [place] [address] [place], [place] [address]    Culture   Final    NO GROWTH 3  DAYS Performed at [facility], [place] [address] [place], [place] [address]    Report Status PENDING  Incomplete  Culture, blood (routine x 2)     Status: None (Preliminary result)   Collection Time: 03/14/19  6:44 PM   Specimen: BLOOD  Result Value Ref Range Status   Specimen Description   Final    BLOOD LEFT ANTECUBITAL Performed at [place] [address], [place], [place] [address]    Special Requests   Final    BOTTLES DRAWN AEROBIC AND ANAEROBIC Blood Culture adequate volume Performed at [place] [address] [place], [place] [address]    Culture   Final    NO GROWTH 3 DAYS Performed at [facility], [place] [address] [place], [place] [address]    Report Status PENDING  Incomplete  Body fluid culture     Status: None (Preliminary result)   Collection Time: 03/15/19 11:04 AM   Specimen: Pleura; Body Fluid  Result Value Ref Range Status   Specimen Description   Final    PLEURAL RIGHT Performed at [place] [address], [place], [place] [address]    Special Requests   Final    Immunocompromised Performed at [facility], [place] [address], [place], [place] [address]    Gram Stain   Final    RARE WBC PRESENT, PREDOMINANTLY MONONUCLEAR NO ORGANISMS SEEN    Culture   Final    NO GROWTH 2 DAYS Performed at [facility], [place] [address], [place], [place] [address]    Report Status PENDING  Incomplete  Culture, Urine     Status: None   Collection Time: 03/15/19 11:19 AM   Specimen: Urine, Clean Catch  Result Value Ref Range Status   Specimen Description   Final    URINE, CLEAN CATCH Performed at [facility], [place] [address] [place], [place] [address]    Special Requests   Final    NONE Performed at [facility], [place] [address], [place], [place] [address]    Culture   Final    Multiple bacterial morphotypes present, none  predominant. Suggest appropriate recollection if clinically indicated.   Report Status 03/16/2019 FINAL  Final  MRSA PCR Screening     Status: Abnormal   Collection Time: 03/16/19  5:19 AM   Specimen: Nasopharyngeal  Result Value Ref Range Status   MRSA by PCR POSITIVE (A) NEGATIVE Final    Comment:        The GeneXpert MRSA Assay (FDA approved for NASAL specimens only), is one  component of a comprehensive MRSA colonization surveillance program. It is not intended to diagnose MRSA infection nor to guide or monitor treatment for MRSA infections. RESULT CALLED TO, READ BACK BY AND VERIFIED WITH: T Andrey Campanile 03/16/19 [place] Performed at [facility], [place] [address] [place], [place] [address]      Studies: No results found.  Scheduled Meds:  Chlorhexidine Gluconate Cloth  6 each Topical Q0600   enoxaparin (LOVENOX) injection  1 mg/kg Subcutaneous Q12H   feeding supplement  1 Container Oral Q24H   feeding supplement (ENSURE ENLIVE)  237 mL Oral BID BM   gabapentin  100 mg Oral BID   latanoprost  1 drop Both Eyes QHS   lidocaine  1 patch Transdermal Q24H   mupirocin ointment  1 application Nasal BID    Continuous Infusions:    Time spent: 35 mins I have personally reviewed and interpreted on  03/17/2019 daily labs,  imagings as discussed above under date review session and assessment and plans.  I reviewed all nursing notes, pharmacy notes, consultant notes,  vitals, pertinent old records  I have discussed plan of care as described above with RN , patient and son at bedside on 03/17/2019   Sol Blazing MD, PhD, FACP  [facility] Pager [phone]. If 7PM-7AM, please contact night-coverage at [email or web address], password [facility] 03/17/2019, 2:08 PM  LOS: 3 days

## 2019-03-17 NOTE — Evaluation (Signed)
Physical Therapy Evaluation Patient Details Name: [name] MRN: [id number] DOB: [birth year] Today's Date: 03/17/2019   History of Present Illness  [name] is a 85 y.o. female with medical history significant for CLL, dementia, hypertension, peripheral neuropathy, chronic constipation who presented to [facility] ED from SNF due to failure to thrive.  She was found to have L LE DVT.  Clinical Impression  Pt admitted with above diagnosis. Pt currently with functional limitations due to the deficits listed below (see PT Problem List). Pt will benefit from skilled PT to increase their independence and safety with mobility to allow discharge to the venue listed below.  Pt was quite lethargic at eval, but able to answer some questions and request to lay on her side. Per chart, she ambulated 72' with rollator when she was hospitalized in May.  Today, she was only able to roll and declined sitting EOB and was fatigued with rolling and fell asleep promptly afterwards.  Recommend returning to [facility].     Follow Up Recommendations SNF    Equipment Recommendations  None recommended by PT    Recommendations for Other Services       Precautions / Restrictions Precautions Precautions: Fall Restrictions Weight Bearing Restrictions: No      Mobility  Bed Mobility Overal bed mobility: Needs Assistance Bed Mobility: Rolling Rolling: Mod assist;+2 for safety/equipment         General bed mobility comments: Pt rolled to her R with MOD A with cues for hand placement on rail and she was assisting. While in sidelying pillows postioned and she was left in semi-R sidelying. Pt had already fallen asleep by end of session.  Transfers                 General transfer comment: deferred  Ambulation/Gait                Stairs            Wheelchair Mobility    Modified Rankin (Stroke Patients Only)       Balance                                            Pertinent Vitals/Pain Pain Assessment: Faces Faces Pain Scale: Hurts even more Pain Location: buttocks and L hip Pain Descriptors / Indicators: Grimacing Pain Intervention(s): Repositioned    Home Living Family/patient expects to be discharged to:: Skilled nursing facility                      Prior Function           Comments: Son was unsure of pt's current level of function.  Per last PT note in May, she ambulated with rollator to the dining hall and walked 36' with PT and rollator while in acute care. When pt was asked, she said she never got out of bed.     Hand Dominance        Extremity/Trunk Assessment   Upper Extremity Assessment Upper Extremity Assessment: Generalized weakness    Lower Extremity Assessment Lower Extremity Assessment: Generalized weakness       Communication      Cognition  General Comments: Pt with hx of dementia, but was oriented to self, location, and year.  She was able to tell us that her bottom hurt and she wanted to lay on her side.      General Comments      Exercises     Assessment/Plan    PT Assessment Patient needs continued PT services  PT Problem List Decreased strength;Decreased range of motion;Decreased activity tolerance;Decreased balance;Decreased mobility;Decreased cognition;Decreased knowledge of use of DME;Decreased safety awareness       PT Treatment Interventions DME instruction;Gait training;Therapeutic activities;Therapeutic exercise;Functional mobility training;Neuromuscular re-education;Patient/family education    PT Goals (Current goals can be found in the Care Plan section)  Acute Rehab PT Goals Patient Stated Goal: none stated Time For Goal Achievement: 03/31/19 Potential to Achieve Goals: Fair    Frequency Min 2X/week   Barriers to discharge        Co-evaluation               AM-PAC PT "6 Clicks" Mobility  Outcome  Measure Help needed turning from your back to your side while in a flat bed without using bedrails?: A Lot Help needed moving from lying on your back to sitting on the side of a flat bed without using bedrails?: A Lot Help needed moving to and from a bed to a chair (including a wheelchair)?: A Lot Help needed standing up from a chair using your arms (e.g., wheelchair or bedside chair)?: A Lot Help needed to walk in hospital room?: Total Help needed climbing 3-5 steps with a railing? : Total 6 Click Score: 10    End of Session   Activity Tolerance: Patient limited by lethargy Patient left: in bed;with call bell/phone within reach;with bed alarm set Nurse Communication: Mobility status PT Visit Diagnosis: Adult, failure to thrive (R62.7);Muscle weakness (generalized) (M62.81);Difficulty in walking, not elsewhere classified (R26.2)    Time: [id number] PT Time Calculation (min) (ACUTE ONLY): 13 min   Charges:   PT Evaluation $PT Eval Low Complexity: 1 Low          Akiva Josey L. Tamala Julian, [place] Pager [phone] 03/17/2019   Galen [place] 03/17/2019, 12:52 PM

## 2019-03-17 NOTE — Progress Notes (Addendum)
HEMATOLOGY-ONCOLOGY PROGRESS NOTE  SUBJECTIVE: [name] is admitted for failure to thrive.  According to the chart, the patient has had minimal intake for the past week.  She has not had specific complaints.  Work-up in the emergency room was significant for lethargy, tachycardia and elevated white blood cell count at 13,000.  Chest x-ray showed a large right pleural effusion completely obstructing the right lung.  CT of the chest again showed the right sided pleural effusion which contributes to the complete volume loss of the right upper lobe and right lower lobe of her lung.  The volume of fluid contributes to leftward shift of the mediastinum and heart.  She was noted to have a pulmonary embolism as well as enhancing nodularity of the right pleural surfaces which is suggestive of metastatic disease and malignant effusion.  She underwent a right thoracentesis which yielded 1.9 L of yellow fluid.  Fluid sent for cytology and is pending.  Fluid also sent for cultures which are negative to date.  REVIEW OF SYSTEMS:   Unable to obtain review of systems secondary to patient condition  I have reviewed the past medical history, past surgical history, social history and family history with the patient and they are unchanged from previous note.   PHYSICAL EXAMINATION:  Vitals:   03/16/19 2019 03/17/19 0550  BP: 132/82 122/71  Pulse: 66 83  Resp: 16 16  Temp: 98.1 F (36.7 C) 98 F (36.7 C)  SpO2: 96% 96%   Filed Weights   03/14/19 1715  Weight: 130 lb (59 kg)    Intake/Output from previous day: 08/23 0701 - 08/24 0700 In: 1698.5 [I.V.:1698.5] Out: 400 [Urine:400]  GENERAL: Lethargic, opens eyes and mumbles but difficult to understand.  Looks comfortable. LYMPH:  no palpable lymphadenopathy in the cervical, axillary or inguinal LUNGS: Diminished breath sounds, poor inspiratory effort HEART: regular rate & rhythm and no murmurs ABDOMEN:abdomen soft, non-tender and normal bowel  sounds Musculoskeletal:no cyanosis of digits and no clubbing  NEURO: Lethargic, difficult to understand  LABORATORY DATA:  I have reviewed the data as listed CMP Latest Ref Rng & Units 03/17/2019 03/14/2019 11/27/2018  Glucose 70 - 99 mg/dL 81 143(H) 88  BUN 8 - 23 mg/dL 12 26(H) 25(H)  Creatinine 0.44 - 1.00 mg/dL 0.35(L) 0.63 0.84  Sodium 135 - 145 mmol/L 137 137 140  Potassium 3.5 - 5.1 mmol/L 4.1 5.1 4.0  Chloride 98 - 111 mmol/L 99 94(L) 110  CO2 22 - 32 mmol/L 27 33(H) 23  Calcium 8.9 - 10.3 mg/dL 9.3 10.4(H) 8.5(L)  Total Protein 6.5 - 8.1 g/dL - 7.0 -  Total Bilirubin 0.3 - 1.2 mg/dL - 0.7 -  Alkaline Phos 38 - 126 U/L - 106 -  AST 15 - 41 U/L - 26 -  ALT 0 - 44 U/L - 30 -    Lab Results  Component Value Date   WBC 13.3 (H) 03/17/2019   HGB 14.4 03/17/2019   HCT 45.4 03/17/2019   MCV 95.2 03/17/2019   PLT 147 (L) 03/17/2019   NEUTROABS 10.2 (H) 03/17/2019    Ct Head Wo Contrast  Result Date: 03/14/2019 CLINICAL DATA:  Encephalopathy, pulmonary embolism EXAM: CT HEAD WITHOUT CONTRAST TECHNIQUE: Contiguous axial images were obtained from the base of the skull through the vertex without intravenous contrast. COMPARISON:  CT head March 10, 2016 FINDINGS: Brain: No evidence of acute infarction, hemorrhage, hydrocephalus, extra-axial collection or mass lesion/mass effect. Symmetric prominence of the ventricles, cisterns and sulci compatible with  parenchymal volume loss. Patchy areas of white matter hypoattenuation are most compatible with chronic microvascular angiopathy. Vascular: Atherosclerotic calcification of the carotid siphons and intradural vertebral arteries. Skull: No calvarial fracture or suspicious osseous lesion. No scalp swelling or hematoma. Sinuses/Orbits: Solitary air-fluid level present in the right sphenoid sinus. Remainder of the paranasal sinuses are predominantly clear. Mastoid air cells are well aerated. Debris in the left external auditory canal. Prior lens  extractions and benign scleral plaques are noted. Orbits are otherwise unremarkable. Other: None. IMPRESSION: 1. No acute intracranial abnormality. 2. Generalized parenchymal volume loss and chronic microvascular ischemic changes. 3. Intracranial atherosclerosis. 4. Solitary air-fluid level in the right sphenoid sinus. Correlate for acute sinusitis. Electronically Signed   By: Lovena Le M.D.   On: 03/14/2019 18:44   Ct Chest W Contrast  Result Date: 03/14/2019 CLINICAL DATA:  85 year old female with a pleural effusion EXAM: CT CHEST WITH CONTRAST TECHNIQUE: Multidetector CT imaging of the chest was performed during intravenous contrast administration. CONTRAST:  9mL OMNIPAQUE IOHEXOL 300 MG/ML  SOLN COMPARISON:  Chest x-ray today March 14, 2019, CT abdomen Dec 21, 2017 and Dec 16, 2017 FINDINGS: Cardiovascular: Heart: No cardiomegaly. No pericardial fluid/thickening. Coronary calcifications of the left anterior descending coronary artery. Aorta: Unremarkable course, caliber, contour of the thoracic aorta. No aneurysm or dissection flap. No periaortic fluid. Pulmonary arteries: The bolus timing is not optimized for evaluation of the pulmonary arteries. They were is a filling defect within the left main pulmonary artery extending into the segmental branches of the left lower lobe. This was not imaged on any of the recent CT chest. Mediastinum/Nodes: Unremarkable thoracic inlet. No supraclavicular or axillary adenopathy. Unremarkable thoracic esophagus. Leftward shift of the mediastinal structures and the heart. Lungs/Pleura: Right-sided pleural effusion contributes to complete collapse of the right upper lobe. Collapse of the right lower lobe, and near complete collapse of the right middle lobe. Airspace disease within the residual aerated segment of the right middle lobe. There are enhancing soft tissue foci on the parietal pleura at the right lung apex and at the anterior pleural space, concerning for  metastatic implants. Mixed linear, ground-glass, and nodular opacities of the left upper lobe and the left lower lobe, which may represent atelectasis. No evidence of left-sided pleural effusion. Upper Abdomen: No acute. Musculoskeletal: Degenerative changes of the spine. No acute displaced fracture identified. Review of the MIP images confirms the above findings. IMPRESSION: Right-sided pleural effusion contributes to complete volume loss of the right upper lobe and right lower lobe, with near complete volume loss of the middle lobe. Volume of fluid contributes to leftward shift of the mediastinum and heart. Filling defect of the left main pulmonary artery extending into segmental branches compatible with pulmonary embolism. Given the mural adherence of the filling defects, subacute thrombus is favored though there are no comparison studies. Enhancing nodularity of the right pleural surfaces suggestive of metastatic disease and malignant effusion. Coronary atherosclerosis. Electronically Signed   By: Corrie Mckusick D.O.   On: 03/14/2019 13:18   Dg Chest Port 1 View  Result Date: 03/14/2019 CLINICAL DATA:  Status post right-sided thoracentesis EXAM: PORTABLE CHEST 1 VIEW COMPARISON:  Film from earlier in the same day. FINDINGS: There is been significant reduction in right-sided pleural effusion following thoracentesis. No pneumothorax is noted. Small residual effusion is seen. The left lung is clear. Cardiac shadow is within normal limits. Old healed right humeral fracture is noted. IMPRESSION: No pneumothorax following right-sided thoracentesis. Significant reduction in right effusion is  noted. Electronically Signed   By: Inez Catalina M.D.   On: 03/14/2019 17:10   Dg Chest Portable 1 View  Result Date: 03/14/2019 CLINICAL DATA:  Failure to thrive. History of chronic lymphocytic leukemia. EXAM: PORTABLE CHEST 1 VIEW COMPARISON:  Radiographs of September 16, 2017. FINDINGS: There is now noted nearly complete  opacification of the right hemithorax with mild mediastinal shift to the left. This is most consistent with pleural effusion with possible associated atelectasis. Left lung is unremarkable. Atherosclerosis of thoracic aorta is noted. No pneumothorax is noted. Bony thorax is unremarkable. IMPRESSION: Nearly complete opacification of the right hemithorax is noted with mild mediastinal shift to the right most consistent with large pleural effusion and possibly associated atelectasis. Aortic Atherosclerosis (ICD10-I70.0). Electronically Signed   By: Marijo Conception M.D.   On: 03/14/2019 10:05   Vas [place] Lower Extremity Venous (dvt)  Result Date: 03/16/2019  Lower Venous Study Indications: Pulmonary embolism.  Limitations: Patient confusion and inability to follow commands. Comparison Study: No prior study on file Performing Technologist: Sharion Dove RVS  Examination Guidelines: A complete evaluation includes B-mode imaging, spectral Doppler, color Doppler, and power Doppler as needed of all accessible portions of each vessel. Bilateral testing is considered an integral part of a complete examination. Limited examinations for reoccurring indications may be performed as noted.  +---------+---------------+---------+-----------+----------+--------------+ RIGHT    CompressibilityPhasicitySpontaneityPropertiesThrombus Aging +---------+---------------+---------+-----------+----------+--------------+ CFV      Full           Yes      Yes                                 +---------+---------------+---------+-----------+----------+--------------+ SFJ      Full                                                        +---------+---------------+---------+-----------+----------+--------------+ FV Prox  Full                                                        +---------+---------------+---------+-----------+----------+--------------+ FV Mid   Full                                                         +---------+---------------+---------+-----------+----------+--------------+ FV DistalFull                                                        +---------+---------------+---------+-----------+----------+--------------+ PFV      Full                                                        +---------+---------------+---------+-----------+----------+--------------+  POP      Full           Yes      Yes                                 +---------+---------------+---------+-----------+----------+--------------+ PTV      Full                                                        +---------+---------------+---------+-----------+----------+--------------+ PERO     Full                                                        +---------+---------------+---------+-----------+----------+--------------+   +---------+---------------+---------+-----------+----------+-------------------+ LEFT     CompressibilityPhasicitySpontaneityPropertiesThrombus Aging      +---------+---------------+---------+-----------+----------+-------------------+ CFV      Partial        No       No                   Acute               +---------+---------------+---------+-----------+----------+-------------------+ SFJ      Partial                                                          +---------+---------------+---------+-----------+----------+-------------------+ FV Prox  None                                                             +---------+---------------+---------+-----------+----------+-------------------+ FV Mid   None                                                             +---------+---------------+---------+-----------+----------+-------------------+ FV DistalNone                                                             +---------+---------------+---------+-----------+----------+-------------------+ PFV      None                                                              +---------+---------------+---------+-----------+----------+-------------------+ POP  Not visualized      +---------+---------------+---------+-----------+----------+-------------------+ PTV                                                   visualized by color +---------+---------------+---------+-----------+----------+-------------------+ PERO                                                  Not visualized      +---------+---------------+---------+-----------+----------+-------------------+ EIV                     No       No                   Acute               +---------+---------------+---------+-----------+----------+-------------------+ CIV                     Yes      Yes                                      +---------+---------------+---------+-----------+----------+-------------------+   Left Technical Findings: Not visualized segments include popliteal, peroneal, not all segments of the posterior tibial.   Summary: Right: There is no evidence of deep vein thrombosis in the lower extremity. Left: Findings consistent with acute deep vein thrombosis involving the left, external iliac vein, common femoral vein, left femoral vein, and left proximal profunda vein.  *See table(s) above for measurements and observations. Electronically signed by Monica Martinez MD on 03/16/2019 at 10:52:45 AM.    Final    US Thoracentesis Asp Pleural Space W/img Guide  Result Date: 03/14/2019 INDICATION: Patient with history of CLL, dyspnea, PE, failure to thrive, right pleural effusion. Request made for diagnostic and therapeutic right thoracentesis. EXAM: ULTRASOUND GUIDED DIAGNOSTIC AND THERAPEUTIC RIGHT THORACENTESIS MEDICATIONS: None COMPLICATIONS: None immediate. PROCEDURE: An ultrasound guided thoracentesis was thoroughly discussed with the patient's son and questions answered. The benefits, risks,  alternatives and complications were also discussed. [name] understands and wishes to proceed with the procedure. Written consent was obtained. Ultrasound was performed to localize and mark an adequate pocket of fluid in the right chest. The area was then prepped and draped in the normal sterile fashion. 1% Lidocaine was used for local anesthesia. Under ultrasound guidance a 6 Fr Safe-T-Centesis catheter was introduced. Thoracentesis was performed. The catheter was removed and a dressing applied. FINDINGS: A total of approximately 1.9 liters of yellow fluid was removed. Samples were sent to the laboratory as requested by the clinical team. IMPRESSION: Successful ultrasound guided diagnostic and therapeutic right thoracentesis yielding 1.9 liters of pleural fluid. Read by: Rowe Robert, PA-C Electronically Signed   By: Jacqulynn Cadet M.D.   On: 03/14/2019 16:35    ASSESSMENT AND PLAN: 1. Chronic lymphocytic leukemia, asymptomatic. 2. History of hypogammaglobulinemia with recurrent infections. She continues monthly IVIG replacement therapy. 3. History of recurrent urinary tract infections. 4. History of mild thrombocytopenia secondary to CLL. 5. Question right upper lobe nodule on chest x-ray 05/09/2016; follow-up chest x-ray 09/19/2016 with more confluent irregular nodular density right upper lobe measuring 3 x  1.8 cm. repeat chest x-ray 01/09/2017-regression of irregular nodular density in the right upper lobe felt to represent interval scarring. Stable on chest x-ray 05/01/2017 6.Admission 12/21/2017 with a right rectus hematoma, felt to be related to Lovenox received during the 12/16/2017 hospital admission    7.  Admission 03/14/2019 for failure to thrive, pulmonary embolism, and right pleural effusion.  [name] is now admitted for failure to thrive, PE, and right pleural effusion.  She is status post  thoracentesis and cytology is pending.  Pleural fluid cultures are negative to date but final report is pending.  She also has a pulmonary embolism and left lower extremity deep vein thrombosis, and is on Lovenox.  1.  Await cytology from pleural fluid. 2.  She is noted to have an elevated white blood cell count.  Await final pleural fluid cultures. 3.  Continue Lovenox for pulmonary embolism/deep vein thrombosis 4.  Further recommendations pending the above results. 5.  Hold gabapentin   LOS: 3 days   Mikey Bussing, DNP, AGPCNP-BC, AOCNP 03/17/19 [name] was interviewed and examined.  She is known to me with a history of CLL She was admitted 03/14/2019 with failure to thrive and a large right pleural effusion.  I doubt the effusion is directly related to CLL.  I am concerned she has developed another malignancy.  She has a DVT and pulmonary embolism. We will follow-up on the pleural fluid cytology and make recommendations for additional diagnostic evaluation and treatment.  I agree with the palliative medicine team that hospice care will be the most likely recommendation if a malignancy is confirmed.  She is very lethargic I will hold the gabapentin.  I will discuss the case with her son when the cytology is available.

## 2019-03-17 NOTE — Progress Notes (Signed)
  Speech Language Pathology Treatment: Dysphagia  Patient Details Name: [name] MRN: [id number] DOB: [birth year] Today's Date: 03/17/2019 Time: 0950-1010 SLP Time Calculation (min) (ACUTE ONLY): 20 min  Assessment / Plan / Recommendation Clinical Impression  SLP visit to determine readiness for dietary advancement, educate pt and to assure tolerance of current diet.  Pt's eyes closed upon SLP entrance to her room however she did awaken to thermal stimulation.  RN arrived to give pt her medications.  Both RN and SLP have concerns for pt's ability to swallow the large vitamin pill, concerning for aspiration even if cut in half. RN able to get order discontinued.    Pt consumed water, ice cream, Boost Breeze, cola, and cheese stick.  Sequential swallows of water noted x8 - without indication of airway compromise or dysphagia.  She does demonstrates prolonged mastication of cheese bolus and thus solids appear laborious for her to consume - however not increased work of breathing noted.  SLP spoke to pt regarding possible dietary advancement to soft solids however she verbalized she wishes to stay on pureed foods at this time.  Given pt's h/o dementia, comprehension of information may be limited.      Pt required moderate cues for pt to help self feed and remain alert - frequently closing her eyes.  Concern for pt receiving adequate nutrition without elevated aspiration risk given her fatigue.   Advise only help pt to consume po when/if she desires and is able to maintain LOA/accepting of po.  Will follow up to educate pt and son and to determine readiness for dietary advancement.  Note palliative following, thanks.    HPI HPI: Ms Katherne Magill, 89y/f, preented to ED from SNF due to failure to thrive. For 1 week she has had minimal oral intake and lethargic. PMH significant for CLL, dementia, hypertension, peripheral neuropathy and chronic constipation. Found to have a large right pleural effusion  completely obstraucting right lung. IR right thoracentesis yielded 1.9L of yellow fluid. Son does not know his mothers previouis diet or if she has a history of swallowing problems.       SLP Plan  Continue with current plan of care       Recommendations  Diet recommendations: Dysphagia 1 (puree);Thin liquid Liquids provided via: Straw Medication Administration: Whole meds with puree(crush if large) Supervision: Staff to assist with self feeding Compensations: Minimize environmental distractions;Slow rate;Small sips/bites;Other (Comment);Lingual sweep for clearance of pocketing(feed only when fully alert/awake, check for oral residuals after po) Postural Changes and/or Swallow Maneuvers: Seated upright 90 degrees;Upright 30-60 min after meal                Oral Care Recommendations: Oral care BID Follow up Recommendations: Skilled Nursing facility SLP Visit Diagnosis: Dysphagia, oral phase (R13.11) Plan: Continue with current plan of care       GO               Luanna Salk, [place] [facility] SLP Acute Rehab Services Pager [phone] Office [phone]  Macario Golds 03/17/2019, 12:33 PM

## 2019-03-17 NOTE — Care Management Important Message (Signed)
Important Message  Patient Details  Name: [name] MRN: [id number] Date of Birth: 1926/03/30   Medicare Important Message Given:  Yes. CMA printed out IM for Case Management Nurse or CSW to give to patient.      Jonerik Sliker 03/17/2019, 8:51 AM

## 2019-03-17 NOTE — Progress Notes (Signed)
PMT progress note  Patient appears more alert, she has eaten some ice cream and cheese earlier today. Son at bedside. See below. Discussed with TRH MD too.     BP 122/71 (BP Location: Right Arm)   Pulse 83   Temp 98 F (36.7 C) (Oral)   Resp 16   Ht 5\' 3"  (1.6 m)   Wt 59 kg   SpO2 96%   BMI 23.03 kg/m  Labs and imaging noted Discussed with son at bedside.   Awake more alert abd pain Has dementia Frail lady Regular S1 S2  PPS 40%  85 yo lady from [facility], admitted with PE, pleural effusion, has history of CLL.  1.9 L of fluid was removed from right lung at the time of admission.  Cytology is pending.  Continue to encourage PO PO PRN tylenol for pain May need palliative care to follow her at SNF upon discharge.  Discussed with the son who is present at bedside.  Patient has 2 sons and other son also lives in [place], [place].  There is no official healthcare power of attorney agent paperwork but son Jori Moll who is present at the bedside is the primary caregiver.  Briefly discussed with son about awaiting cytology from pleural fluid if it shows malignant cells, given patient's recent failure to thrive and PE and pleural effusion, it may be prudent to consider hospice (as opposed to SNF with palliative) as an additional layer of support at her [facility] facility.  PMT to follow.  No additional PMT specific recommendations at this time.  Greater than 50%  of this time was spent counseling and coordinating care related to the above assessment and plan.  35 minutes spent [facility] palliative team [phone]

## 2019-03-18 DIAGNOSIS — Z9889 Other specified postprocedural states: Secondary | ICD-10-CM

## 2019-03-18 DIAGNOSIS — J910 Malignant pleural effusion: Secondary | ICD-10-CM

## 2019-03-18 MED ORDER — SODIUM CHLORIDE 0.45 % IV SOLN
INTRAVENOUS | Status: DC
  Administered 2019-03-18: 17:00:00 via INTRAVENOUS

## 2019-03-18 NOTE — Progress Notes (Signed)
PROGRESS NOTE  [name] [id number] DOB: 1926/05/06 DOA: 03/14/2019 PCP: Cassandria Anger, MD  PCP: Cassandria Anger, MD  Outpatient Specialists: Oncology, Dr. Benay Spice Patient coming from: [place], [place]  Chief Complaint: Failure to thrive  HPI: [name] is a 85 y.o. female with medical history significant for CLL, dementia, hypertension, peripheral neuropathy, chronic constipation who presented to [facility] ED from SNF due to failure to thrive.  Patient is unable to provide a history.  History is obtained from [name], her son in the room, and medical records.  For the past week the patient has had minimal oral intake, not eating or drinking much.  No specific complaints.  No reported fever, chills, chest pain, abdominal pain, vomiting, or diarrhea.  She is on 4 L by nasal cannula at baseline.    ED Course: Lethargic on presentation, vital signs remarkable for tachycardia with heart rate in the 110's.  Lab studies remarkable for leukocytosis with WBC 13 K.  UA pending.  Chest x-ray personally reviewed showed large right pleural effusion completely obstructing right lung.  Personally obtained consent from patient's son at bedside for thoracentesis.  IR right thoracentesis yielded 1.9 L of yellow fluid.  Fluid sent for cytology.  Highly appreciate IR assistance in the care of this patient.  TRH was asked to admit.     HPI/Recap of past 24 hours: She remain weak, she is able to answer simple questions, c/o dry mouth  Assessment/Plan: Principal Problem:   Deep vein thrombosis (DVT) with pulmonary embolism present on admission [facility]) Active Problems:   CLL (chronic lymphocytic leukemia) (HCC)   Failure to thrive (0-17)   Failure to thrive in adult   Pleural effusion   Other pulmonary embolism without acute cor pulmonale (HCC)   Chronic respiratory failure with hypoxia (HCC)   Hypoglobulinemia   Immunosuppressed status (HCC)   Pressure injury of skin   Acute deep vein  thrombosis (DVT) of left lower extremity (HCC)  # Large right pleural effusion suspect malignant  Pleural effusion  -status post right thoracentesis by IR on 03/14/2019  - Thoracentesis yielded 1.9 L of yellow fluid,   -  Cytology showing atypical cells "susipicious for malignancy" -  CT showed pleural changes suspicious for metastases  -  Pleural fluid culture is negative - per ONC Dr Benay Spice this not likely due to CLL - per ONC Dr Benay Spice if this is malignant effusion will need hospice most likely. Have talked w/ son and length today and explained results of CT and cytology.  Have d/w Dr Benay Spice, he be by in the am Wed to see the patient and he will discuss further w/ the son. Will prob need hospice/ [facility] and pleurex cath per Dr Benay Spice.    FTT/ Dementia- is DNR; is a  SNF [place] resident Son reports he last saw patient two months ago, patient was able to walk with a walker two months ago   # Acute/subacute PE/acute DVT left lower extremity: -CT chest on presentation showed "Filling defect of the left main pulmonary artery extending into segmental branches compatible with pulmonary embolism. Given the mural adherence of the filling defects, subacute thrombus is favored though there are no comparison studies." -Echo lvef wnl, Right Ventricle: The right ventricle has mildly reduced systolic function. The cavity was mildly enlarged. There is no increase in right ventricular wall thickness. Right ventricular systolic pressure is mildly elevated with an estimated pressure of 34.4  mmHg. -venous doppler "acute deep vein thrombosis  involving the left, external iliac vein, common femoral vein, left femoral vein, and left proximal profunda vein -IV Heparin started on admission with brief interruption due to needing thoracentesis, due to poor iv access have changed to lovenox SQ -On 4liter o2, does not appear in respiratory distress   # Chronic hypoxic respiratory failure She is on 4 L  oxygen supplementation at baseline  # Leukocytosis with elevated hemoglobin , likely from dehydration  On hydration Blood culture no growth Add on pleural fluids culture and gram stain  UA clear, urine culture with multiple, nonpredominant   Will hold off abx, repeat cbc in am  # Acute metabolic encephalopathy -on hydration , treating underline acute issues, rule out infection, slightly more alert today -she more awake on 8/23, speech recommends >>  Dysphagia 1 (Puree);Thin liquid  Liquid Administration via: Cup;Straw Medication Administration: Whole meds with puree Supervision: Full supervision/cueing for compensatory strategies Compensations: Minimize environmental distractions;Slow rate;Small sips/bites(feed only when fully alert/awake) Postural Changes: Seated upright at 90 degrees  -palliative care consulted for goals of care, they are aware  HTN: bp soft, holding home bp meds  Hypogammaglobulinemia /immunosuppressed status: with recurrent infections (UTIs). She continues monthly IVIG replacement therapy, last received on 7/20 Followed by Dr Benay Spice   CLL, asymptomatic Followed by Dr Benay Spice   Chronic constipation Resume outpatient medications   Stage II sacral decubitus ulcer, presents on admission Skin care   DVT Prophylaxis: on therapeutic lovenox  Code Status: DNR  Family Communication: patient w/ son at bedside   Disposition Plan: not ready yet  Labs: in am  Kelly Splinter MD  [facility]  pgr [phone] 03/18/2019, 3:51 PM   Consultants:  Palliative care  IR  Procedures:  Thoracentesis on 8/21  Antibiotics:  none   Objective: BP 120/66 (BP Location: Left Leg)   Pulse 84   Temp 98.1 F (36.7 C) (Oral)   Resp 16   Ht 5\' 3"  (1.6 m)   Wt 59 kg   SpO2 98%   BMI 23.03 kg/m   Intake/Output Summary (Last 24 hours) at 03/18/2019 1545 Last data filed at 03/18/2019 1356 Gross per 24 hour  Intake 0 ml  Output 100 ml  Net -100 ml   Filed Weights    03/14/19 1715  Weight: 59 kg    Exam: Patient is examined daily including today on 03/18/2019, exams remain the same as of yesterday except that has changed    General:  More alert, able to answer simple yes/no questions  Cardiovascular: RRR  Respiratory: diminished,   Abdomen: Soft/ND/NT, positive BS  Musculoskeletal: No Edema  Neuro: More alert, able to answer simple yes/no questions  Data Reviewed: Basic Metabolic Panel: Recent Labs  Lab 03/14/19 0948 03/17/19 0648  NA 137 137  K 5.1 4.1  CL 94* 99  CO2 33* 27  GLUCOSE 143* 81  BUN 26* 12  CREATININE 0.63 0.35*  CALCIUM 10.4* 9.3  MG  --  2.0   Liver Function Tests: Recent Labs  Lab 03/14/19 0948  AST 26  ALT 30  ALKPHOS 106  BILITOT 0.7  PROT 7.0  ALBUMIN 2.6*   No results for input(s): LIPASE, AMYLASE in the last 168 hours. No results for input(s): AMMONIA in the last 168 hours. CBC: Recent Labs  Lab 03/14/19 0948 03/15/19 0537 03/17/19 0648  WBC 13.4* 15.1* 13.3*  NEUTROABS 9.7*  --  10.2*  HGB 16.7* 17.1* 14.4  HCT 53.0* 53.5* 45.4  MCV 95.5 93.0 95.2  PLT 193  157 147*   Cardiac Enzymes:   No results for input(s): CKTOTAL, CKMB, CKMBINDEX, TROPONINI in the last 168 hours. BNP (last 3 results) No results for input(s): BNP in the last 8760 hours.  ProBNP (last 3 results) No results for input(s): PROBNP in the last 8760 hours.  CBG: Recent Labs  Lab 03/15/19 1110 03/15/19 1631 03/15/19 2042 03/16/19 0731 03/16/19 1652  GLUCAP 218* 193* 204* 141* 96    Recent Results (from the past 240 hour(s))  SARS Coronavirus 2 [facility] order, Performed in [facility] hospital lab) Nasopharyngeal Nasopharyngeal Swab     Status: None   Collection Time: 03/14/19  9:36 AM   Specimen: Nasopharyngeal Swab  Result Value Ref Range Status   SARS Coronavirus 2 NEGATIVE NEGATIVE Final    Comment: (NOTE) If result is NEGATIVE SARS-CoV-2 target nucleic acids are NOT DETECTED. The SARS-CoV-2 RNA is  generally detectable in upper and lower  respiratory specimens during the acute phase of infection. The lowest  concentration of SARS-CoV-2 viral copies this assay can detect is 250  copies / mL. A negative result does not preclude SARS-CoV-2 infection  and should not be used as the sole basis for treatment or other  patient management decisions.  A negative result may occur with  improper specimen collection / handling, submission of specimen other  than nasopharyngeal swab, presence of viral mutation(s) within the  areas targeted by this assay, and inadequate number of viral copies  (<250 copies / mL). A negative result must be combined with clinical  observations, patient history, and epidemiological information. If result is POSITIVE SARS-CoV-2 target nucleic acids are DETECTED. The SARS-CoV-2 RNA is generally detectable in upper and lower  respiratory specimens dur ing the acute phase of infection.  Positive  results are indicative of active infection with SARS-CoV-2.  Clinical  correlation with patient history and other diagnostic information is  necessary to determine patient infection status.  Positive results do  not rule out bacterial infection or co-infection with other viruses. If result is PRESUMPTIVE POSTIVE SARS-CoV-2 nucleic acids MAY BE PRESENT.   A presumptive positive result was obtained on the submitted specimen  and confirmed on repeat testing.  While 2019 novel coronavirus  (SARS-CoV-2) nucleic acids may be present in the submitted sample  additional confirmatory testing may be necessary for epidemiological  and / or clinical management purposes  to differentiate between  SARS-CoV-2 and other Sarbecovirus currently known to infect humans.  If clinically indicated additional testing with an alternate test  methodology [id number]) is advised. The SARS-CoV-2 RNA is generally  detectable in upper and lower respiratory sp ecimens during the acute  phase of infection.  The expected result is Negative. Fact Sheet for Patients:  StrictlyIdeas.no Fact Sheet for Healthcare Providers: BankingDealers.co.za This test is not yet approved or cleared by the [place] FDA and has been authorized for detection and/or diagnosis of SARS-CoV-2 by FDA under an Emergency Use Authorization (EUA).  This EUA will remain in effect (meaning this test can be used) for the duration of the COVID-19 declaration under Section 564(b)(1) of the Act, 21 U.S.C. section 360bbb-3(b)(1), unless the authorization is terminated or revoked sooner. Performed at [facility], [place] [address] [place], [place] [address]   Culture, blood (routine x 2)     Status: None (Preliminary result)   Collection Time: 03/14/19  6:44 PM   Specimen: BLOOD  Result Value Ref Range Status   Specimen Description   Final    BLOOD  RIGHT ANTECUBITAL Performed at [place] [address], [place], [place] [address]    Special Requests   Final    BOTTLES DRAWN AEROBIC AND ANAEROBIC Blood Culture adequate volume Performed at [place] [address] [place], [place] [address]    Culture   Final    NO GROWTH 4 DAYS Performed at [facility], [place] [address], [place], [place] [address]    Report Status PENDING  Incomplete  Culture, blood (routine x 2)     Status: None (Preliminary result)   Collection Time: 03/14/19  6:44 PM   Specimen: BLOOD  Result Value Ref Range Status   Specimen Description   Final    BLOOD LEFT ANTECUBITAL Performed at [place] [address], [place], [place] [address]    Special Requests   Final    BOTTLES DRAWN AEROBIC AND ANAEROBIC Blood Culture adequate volume Performed at [place] [address] [place], [place] [address]    Culture   Final    NO GROWTH 4 DAYS Performed at [facility], [place]  [address] [place], [place] [address]    Report Status PENDING  Incomplete  Body fluid culture     Status: None (Preliminary result)   Collection Time: 03/15/19 11:04 AM   Specimen: Pleura; Body Fluid  Result Value Ref Range Status   Specimen Description   Final    PLEURAL RIGHT Performed at [place] [address], [place], [place] [address]    Special Requests   Final    Immunocompromised Performed at [facility], [place] [address], [place], [place] [address]    Gram Stain   Final    RARE WBC PRESENT, PREDOMINANTLY MONONUCLEAR NO ORGANISMS SEEN    Culture   Final    NO GROWTH 3 DAYS Performed at [facility], [place] [address], [place], [place] [address]    Report Status PENDING  Incomplete  Culture, Urine     Status: None   Collection Time: 03/15/19 11:19 AM   Specimen: Urine, Clean Catch  Result Value Ref Range Status   Specimen Description   Final    URINE, CLEAN CATCH Performed at [facility] [place] [facility], [place] [address], [place], [place] [address]    Special Requests   Final    NONE Performed at [facility], [place] [address] [place], [place] [address]    Culture   Final    Multiple bacterial morphotypes present, none predominant. Suggest appropriate recollection if clinically indicated.   Report Status 03/16/2019 FINAL  Final  MRSA PCR Screening     Status: Abnormal   Collection Time: 03/16/19  5:19 AM   Specimen: Nasopharyngeal  Result Value Ref Range Status   MRSA by PCR POSITIVE (A) NEGATIVE Final    Comment:        The GeneXpert MRSA Assay (FDA approved for NASAL specimens only), is one component of a comprehensive MRSA colonization surveillance program. It is not intended to diagnose MRSA infection nor to guide or monitor treatment for MRSA infections. RESULT CALLED TO, READ BACK BY AND VERIFIED WITH: T Andrey Campanile 03/16/19 [place] Performed at [facility], [place]  [address] [place],  [address]      Studies: No results found.  Scheduled Meds: . Chlorhexidine Gluconate Cloth  6 each Topical Q0600  . enoxaparin (LOVENOX) injection  1 mg/kg Subcutaneous Q12H  .  feeding supplement  1 Container Oral Q24H  . feeding supplement (ENSURE ENLIVE)  237 mL Oral BID BM  . latanoprost  1 drop Both Eyes QHS  . lidocaine  1 patch Transdermal Q24H  . mupirocin ointment  1 application Nasal BID    Continuous Infusions:

## 2019-03-18 NOTE — Progress Notes (Signed)
Daily Progress Note   Patient Name: [name]       Date: 03/18/2019 DOB: [birth year]  Age: 85 y.o. MRN#: [id number] Attending Physician: Roney Jaffe, MD Primary Care Physician: Cassandria Anger, MD Admit Date: 03/14/2019  Reason for Consultation/Follow-up: Establishing goals of care  Subjective: I saw and examined [name] today.  She was sleepy but arousable.  Only concern was getting sip of water.  No family at bedside.   Length of Stay: 4  Current Medications: Scheduled Meds:  . Chlorhexidine Gluconate Cloth  6 each Topical Q0600  . enoxaparin (LOVENOX) injection  1 mg/kg Subcutaneous Q12H  . feeding supplement  1 Container Oral Q24H  . feeding supplement (ENSURE ENLIVE)  237 mL Oral BID BM  . latanoprost  1 drop Both Eyes QHS  . lidocaine  1 patch Transdermal Q24H  . mupirocin ointment  1 application Nasal BID    Continuous Infusions:   PRN Meds: acetaminophen, ipratropium-albuterol, magnesium hydroxide, ondansetron (ZOFRAN) IV  Physical Exam         General: Sleepy but arousable Heart: Regular rate and rhythm. No murmur appreciated. Lungs: Good air movement, clear Abdomen: Soft, nontender, nondistended, positive bowel sounds.  Ext: No significant edema Skin: Warm and dry  Vital Signs: BP 120/66 (BP Location: Left Leg)   Pulse 84   Temp 98.1 F (36.7 C) (Oral)   Resp 16   Ht 5\' 3"  (1.6 m)   Wt 59 kg   SpO2 98%   BMI 23.03 kg/m  SpO2: SpO2: 98 % O2 Device: O2 Device: Nasal Cannula O2 Flow Rate: O2 Flow Rate (L/min): 3 L/min  Intake/output summary:   Intake/Output Summary (Last 24 hours) at 03/18/2019 1414 Last data filed at 03/18/2019 1356 Gross per 24 hour  Intake 0 ml  Output 100 ml  Net -100 ml   LBM: Last BM Date: 03/17/19  Baseline Weight: Weight: 59 kg Most recent weight: Weight: 59 kg       Palliative Assessment/Data:      Patient Active Problem List   Diagnosis Date Noted  . Deep vein thrombosis (DVT) with pulmonary embolism present on admission ([place]) 03/17/2019  . Pressure injury of skin 03/16/2019  . Acute deep vein thrombosis (DVT) of left lower extremity ([place])   . Failure to thrive in adult   .  Pleural effusion   . Other pulmonary embolism without acute cor pulmonale ([place])   . Chronic respiratory failure with hypoxia ([place])   . Hypoglobulinemia   . Immunosuppressed status ([place])   . Failure to thrive (0-17) 03/14/2019  . AKI (acute kidney injury) ([place]) 11/26/2018  . Rectus sheath hematoma 12/21/2017  . Altered mental status 12/16/2017  . Diarrhea 09/19/2017  . Constipation 09/12/2017  . Alzheimer disease ([place]) 07/25/2017  . Neck pain on left side 07/25/2017  . Urinary tract infection, recurrent 06/12/2017  . Thrush 02/10/2017  . Frequent urination 02/10/2017  . Osteoporosis 11/23/2016  . Abdominal pain 08/16/2016  . Urinary frequency 08/16/2016  . Peripheral edema 08/16/2016  . General weakness 08/16/2016  . Gait disorder 08/16/2016  . Dysuria 08/03/2016  . Closed displaced simple supracondylar fracture of right humerus without intercondylar fracture 05/10/2016  . Urinary tract infection without hematuria 05/10/2016  . Syncope 05/10/2016  . Humeral head fracture, right, closed, initial encounter   . Burning tongue 03/25/2016  . Post concussion syndrome 03/03/2016  . Fall 03/03/2016  . Cystocele with prolapse 03/05/2015  . Dehydration 02/08/2015  . Hyponatremia 02/08/2015  . Well adult exam 10/01/2014  . Grief at loss of child 12/11/2013  . Irregular heart beat 09/29/2013  . Humeral head fracture 08/26/2013  . Actinic keratoses 08/26/2013  . Carotid bruit 05/02/2012  . Anxiety 03/17/2011  . ANEMIA, PERNICIOUS, HX OF 10/10/2007  . Peripheral neuropathy ([place]) 09/04/2007  .  Chronic fatigue 08/06/2007  . CLL (chronic lymphocytic leukemia) ([place]) 06/17/2007  . Allergic rhinitis 06/17/2007  . OSTEOARTHRITIS 06/17/2007  . B12 deficiency 06/07/2007  . GERD (gastroesophageal reflux disease) 04/30/2007  . Dyslipidemia 12/19/2006  . Essential hypertension 12/19/2006  . DIVERTICULOSIS, COLON 05/19/2003  . COLONIC POLYPS, HX OF 01/10/2000    Palliative Care Assessment & Plan   Patient Profile: 85 yo female with history of CLL admitted with PE and pleural effusion which was drained.  Await cytology results.  Recommendations/Plan: GOC: Await oncology evaluation of her situation once cytology results are available.  If it shows malignant cells, given patient's recent failure to thrive and PE and pleural effusion,  She may be best served by hospice moving forward.   Code Status:    Code Status Orders  (From admission, onward)         Start     Ordered   03/14/19 1656  Do not attempt resuscitation (DNR)  Continuous    Question Answer Comment  In the event of cardiac or respiratory ARREST Do not call a "code blue"   In the event of cardiac or respiratory ARREST Do not perform Intubation, CPR, defibrillation or ACLS   In the event of cardiac or respiratory ARREST Use medication by any route, position, wound care, and other measures to relive pain and suffering. May use oxygen, suction and manual treatment of airway obstruction as needed for comfort.      03/14/19 1655        Code Status History    Date Active Date Inactive Code Status Order ID Comments User Context   11/25/2018 1658 11/27/2018 1538 DNR [id number]  Damita Lack, MD ED   12/21/2017 0902 12/23/2017 1754 DNR [id number]  Mariel Aloe, MD Inpatient   12/20/2017 1558 12/20/2017 2007 DNR [id number]  Cristal Ford, DO Inpatient   12/16/2017 1844 12/20/2017 1558 Full Code [id number]  Velvet Bathe, MD Inpatient   05/10/2016 0211 05/11/2016 1754 Full Code [id number]  Lily Kocher, MD Inpatient   02/08/2015  [id number] 02/09/2015 2031 Full Code [id number]  Rise Patience, MD Inpatient   Advance Care Planning Activity    Advance Directive Documentation     Most Recent Value  Type of Advance Directive  Out of facility DNR (pink MOST or yellow form)  Pre-existing out of facility DNR order (yellow form or pink MOST form)  Yellow form placed in chart (order not valid for inpatient use)  "MOST" Form in Place?  -       Discharge Planning:  To Be Determined  Care plan was discussed with patient, RN  Thank you for allowing the Palliative Medicine Team to assist in the care of this patient.   Time In: 1035 Time Out: 1100 Total Time 25 Prolonged Time Billed No      Greater than 50%  of this time was spent counseling and coordinating care related to the above assessment and plan.  Micheline Rough, MD  Please contact Palliative Medicine Team phone at [phone] for questions and concerns.

## 2019-03-18 NOTE — TOC Initial Note (Addendum)
Transition of Care [facility]) - Initial/Assessment Note    Patient Details  Name: [name] MRN: [id number] Date of Birth: Feb 02, 1926  Transition of Care [facility]) CM/SW Contact:    Nila Nephew, LCSW Phone Number: [phone] 03/18/2019, 11:00 AM  Clinical Narrative:    Pt admitted with failure to thrive from [facility] SNF where she is a long term care resident, has dementia and respiratory failure, uses 4L o2 at baseline.  Pt with PE, pleural effusion, CLL, encephalopathy. PMT involved to assist with goals of care, at this point anticipating pt return to SNF with at least palliative care following if not hospice.  If returning with palliative, facility can submit for [facility] Medicare authorization to attempt approving trial of therapy rehab upon return- in-house therapy evals have been completed. Facility can also manage pt return with hospice if this is decided as well (FL2 would need updating with this change in care plan). TOC team will follow to assist with disposition- anticipating return to [facility] with palliative or hospice care depending on hospital course and goals of care.               Expected Discharge Plan: Skilled Nursing Facility Barriers to Discharge: Continued Medical Work up   Patient Goals and CMS Choice Patient states their goals for this hospitalization and ongoing recovery are:: working on goals      Expected Discharge Plan and Services Expected Discharge Plan: [place] arrangements for the past 2 months: [place] Expected Discharge Date: 03/17/19                                    Prior Living Arrangements/Services Living arrangements for the past 2 months: [place] Lives with:: Facility Resident Patient language and need for interpreter reviewed:: No        Need for Family Participation in Patient Care: Yes (Comment) Care giver support system in place?: Yes (comment)(son,  grandson, SNF)   Criminal Activity/Legal Involvement Pertinent to Current Situation/Hospitalization: No - Comment as needed  Activities of Daily Living Home Assistive Devices/Equipment: None ADL Screening (condition at time of admission) Patient's cognitive ability adequate to safely complete daily activities?: Yes Is the patient deaf or have difficulty hearing?: Yes Does the patient have difficulty seeing, even when wearing glasses/contacts?: No Does the patient have difficulty concentrating, remembering, or making decisions?: Yes Patient able to express need for assistance with ADLs?: Yes Does the patient have difficulty dressing or bathing?: Yes Independently performs ADLs?: No Communication: Independent Dressing (OT): Needs assistance Is this a change from baseline?: Pre-admission baseline Grooming: Needs assistance Is this a change from baseline?: Pre-admission baseline Feeding: Needs assistance Is this a change from baseline?: Pre-admission baseline Bathing: Needs assistance Is this a change from baseline?: Pre-admission baseline Toileting: Needs assistance Is this a change from baseline?: Pre-admission baseline In/Out Bed: Needs assistance Is this a change from baseline?: Pre-admission baseline Walks in Home: Needs assistance Is this a change from baseline?: Pre-admission baseline Does the patient have difficulty walking or climbing stairs?: No Weakness of Legs: Both Weakness of Arms/Hands: Both  Permission Sought/Granted Permission sought to share information with : Facility Sport and exercise psychologist, Family Supports Permission granted to share information with : No(pt not oriented to provide consent- below are pt's family and caregivers)  Share Information with NAME: [phone] son Jori Moll  Permission granted to share info w  AGENCY: [facility]        Emotional Assessment       Orientation: : Oriented to Self Alcohol / Substance Use: Not Applicable Psych  Involvement: No (comment)  Admission diagnosis:  Pleural effusion [J90] Hypoxia [R09.02] S/P thoracentesis [Z98.890] Failure to thrive in adult [R62.7] Patient Active Problem List   Diagnosis Date Noted  . Deep vein thrombosis (DVT) with pulmonary embolism present on admission ([place]) 03/17/2019  . Pressure injury of skin 03/16/2019  . Acute deep vein thrombosis (DVT) of left lower extremity ([place])   . Failure to thrive in adult   . Pleural effusion   . Other pulmonary embolism without acute cor pulmonale ([place])   . Chronic respiratory failure with hypoxia ([place])   . Hypoglobulinemia   . Immunosuppressed status ([place])   . Failure to thrive (0-17) 03/14/2019  . AKI (acute kidney injury) ([place]) 11/26/2018  . Rectus sheath hematoma 12/21/2017  . Altered mental status 12/16/2017  . Diarrhea 09/19/2017  . Constipation 09/12/2017  . Alzheimer disease ([place]) 07/25/2017  . Neck pain on left side 07/25/2017  . Urinary tract infection, recurrent 06/12/2017  . Thrush 02/10/2017  . Frequent urination 02/10/2017  . Osteoporosis 11/23/2016  . Abdominal pain 08/16/2016  . Urinary frequency 08/16/2016  . Peripheral edema 08/16/2016  . General weakness 08/16/2016  . Gait disorder 08/16/2016  . Dysuria 08/03/2016  . Closed displaced simple supracondylar fracture of right humerus without intercondylar fracture 05/10/2016  . Urinary tract infection without hematuria 05/10/2016  . Syncope 05/10/2016  . Humeral head fracture, right, closed, initial encounter   . Burning tongue 03/25/2016  . Post concussion syndrome 03/03/2016  . Fall 03/03/2016  . Cystocele with prolapse 03/05/2015  . Dehydration 02/08/2015  . Hyponatremia 02/08/2015  . Well adult exam 10/01/2014  . Grief at loss of child 12/11/2013  . Irregular heart beat 09/29/2013  . Humeral head fracture 08/26/2013  . Actinic keratoses 08/26/2013  . Carotid bruit 05/02/2012  . Anxiety 03/17/2011  . ANEMIA, PERNICIOUS, HX OF 10/10/2007  .  Peripheral neuropathy ([place]) 09/04/2007  . Chronic fatigue 08/06/2007  . CLL (chronic lymphocytic leukemia) ([place]) 06/17/2007  . Allergic rhinitis 06/17/2007  . OSTEOARTHRITIS 06/17/2007  . B12 deficiency 06/07/2007  . GERD (gastroesophageal reflux disease) 04/30/2007  . Dyslipidemia 12/19/2006  . Essential hypertension 12/19/2006  . DIVERTICULOSIS, COLON 05/19/2003  . COLONIC POLYPS, HX OF 01/10/2000   PCP:  Cassandria Anger, MD Pharmacy:   [id number] - [place], [place] [address] Phone: [phone] Fax: [phone]     Social Determinants of Health (SDOH) Interventions    Readmission Risk Interventions No flowsheet data found.

## 2019-03-18 NOTE — NC FL2 (Signed)
[place] MEDICAID FL2 LEVEL OF CARE SCREENING TOOL     IDENTIFICATION  Patient Name: [name] Birthdate: February 12, 1926 Sex: female Admission Date (Current Location): 03/14/2019  [facility] and [place] Number:  Herbalist and Address:  [facility],  [address] [place], [place]      Provider Number: [id number]  Attending Physician Name and Address:  Roney Jaffe, MD  Relative Name and Phone Number:  [phone] Shantoya Lame    Current Level of Care: Hospital Recommended Level of Care: [place] Prior Approval Number:    Date Approved/Denied:   PASRR Number: [id number]  Discharge Plan: SNF    Current Diagnoses: Patient Active Problem List   Diagnosis Date Noted  . Deep vein thrombosis (DVT) with pulmonary embolism present on admission ([place]) 03/17/2019  . Pressure injury of skin 03/16/2019  . Acute deep vein thrombosis (DVT) of left lower extremity ([place])   . Failure to thrive in adult   . Pleural effusion   . Other pulmonary embolism without acute cor pulmonale ([place])   . Chronic respiratory failure with hypoxia ([place])   . Hypoglobulinemia   . Immunosuppressed status ([place])   . Failure to thrive (0-17) 03/14/2019  . AKI (acute kidney injury) ([place]) 11/26/2018  . Rectus sheath hematoma 12/21/2017  . Altered mental status 12/16/2017  . Diarrhea 09/19/2017  . Constipation 09/12/2017  . Alzheimer disease ([place]) 07/25/2017  . Neck pain on left side 07/25/2017  . Urinary tract infection, recurrent 06/12/2017  . Thrush 02/10/2017  . Frequent urination 02/10/2017  . Osteoporosis 11/23/2016  . Abdominal pain 08/16/2016  . Urinary frequency 08/16/2016  . Peripheral edema 08/16/2016  . General weakness 08/16/2016  . Gait disorder 08/16/2016  . Dysuria 08/03/2016  . Closed displaced simple supracondylar fracture of right humerus without intercondylar fracture 05/10/2016  . Urinary tract infection without hematuria 05/10/2016   . Syncope 05/10/2016  . Humeral head fracture, right, closed, initial encounter   . Burning tongue 03/25/2016  . Post concussion syndrome 03/03/2016  . Fall 03/03/2016  . Cystocele with prolapse 03/05/2015  . Dehydration 02/08/2015  . Hyponatremia 02/08/2015  . Well adult exam 10/01/2014  . Grief at loss of child 12/11/2013  . Irregular heart beat 09/29/2013  . Humeral head fracture 08/26/2013  . Actinic keratoses 08/26/2013  . Carotid bruit 05/02/2012  . Anxiety 03/17/2011  . ANEMIA, PERNICIOUS, HX OF 10/10/2007  . Peripheral neuropathy ([place]) 09/04/2007  . Chronic fatigue 08/06/2007  . CLL (chronic lymphocytic leukemia) ([place]) 06/17/2007  . Allergic rhinitis 06/17/2007  . OSTEOARTHRITIS 06/17/2007  . B12 deficiency 06/07/2007  . GERD (gastroesophageal reflux disease) 04/30/2007  . Dyslipidemia 12/19/2006  . Essential hypertension 12/19/2006  . DIVERTICULOSIS, COLON 05/19/2003  . COLONIC POLYPS, HX OF 01/10/2000    Orientation RESPIRATION BLADDER Height & Weight     Self  O2(3-4 L) Incontinent Weight: 130 lb (59 kg) Height:  5\' 3"  (160 cm)  BEHAVIORAL SYMPTOMS/MOOD NEUROLOGICAL BOWEL NUTRITION STATUS      Incontinent Diet(dysphasia III diet, sit upright 90 degrees for meals)  AMBULATORY STATUS COMMUNICATION OF NEEDS Skin   Extensive Assist Verbally PU Stage and Appropriate Care(stage II pressure injury- sacrum, foam dressing)                       Personal Care Assistance Level of Assistance  Bathing, Feeding, Dressing Bathing Assistance: Maximum assistance Feeding assistance: Maximum assistance Dressing Assistance: Maximum assistance     Functional Limitations Info  SPECIAL CARE FACTORS FREQUENCY  PT (By licensed PT), OT (By licensed OT), Speech therapy     PT Frequency: 5x OT Frequency: 5x     Speech Therapy Frequency: 3x      Contractures Contractures Info: Not present    Additional Factors Info  Code Status, Allergies Code  Status Info: DNR Allergies Info: Pneumococcal Vaccines, Clarithromycin, Hctz Hydrochlorothiazide, Oxycodone-aspirin, Propoxyphene N-acetaminophen           Current Medications (03/18/2019):  This is the current hospital active medication list Current Facility-Administered Medications  Medication Dose Route Frequency Provider Last Rate Last Dose  . acetaminophen (TYLENOL) tablet 650 mg  650 mg Oral Q6H PRN Loistine Chance, MD   650 mg at 03/16/19 1306  . Chlorhexidine Gluconate Cloth 2 % PADS 6 each  6 each Topical Q0600 Florencia Reasons, MD   6 each at 03/18/19 0645  . enoxaparin (LOVENOX) injection 60 mg  1 mg/kg Subcutaneous Q12H Angela Adam, RPH   60 mg at 03/18/19 0530  . feeding supplement (BOOST / RESOURCE BREEZE) liquid 1 Container  1 Container Oral Q24H Florencia Reasons, MD   1 Container at 03/18/19 1027  . feeding supplement (ENSURE ENLIVE) (ENSURE ENLIVE) liquid 237 mL  237 mL Oral BID BM Florencia Reasons, MD   237 mL at 03/17/19 1719  . ipratropium-albuterol (DUONEB) 0.5-2.5 (3) MG/3ML nebulizer solution 3 mL  3 mL Nebulization Q6H PRN Hall, Carole N, DO      . latanoprost (XALATAN) 0.005 % ophthalmic solution 1 drop  1 drop Both Eyes QHS Hall, Cyrus N, DO   1 drop at 03/17/19 2153  . lidocaine (LIDODERM) 5 % 1 patch  1 patch Transdermal Q24H Irene Pap N, DO   1 patch at 03/16/19 1722  . magnesium hydroxide (MILK OF MAGNESIA) suspension 30 mL  30 mL Oral Daily PRN Irene Pap N, DO      . mupirocin ointment (BACTROBAN) 2 % 1 application  1 application Nasal BID Florencia Reasons, MD   1 application at [phone]  . ondansetron (ZOFRAN) injection 4 mg  4 mg Intravenous Q6H PRN Kayleen Memos, DO         Discharge Medications: Please see discharge summary for a list of discharge medications.  Relevant Imaging Results:  Relevant Lab Results:   Additional Information SS# [id number]. Needs palliative care to follow at facility  [facility], LCSW

## 2019-03-19 DIAGNOSIS — Z515 Encounter for palliative care: Secondary | ICD-10-CM

## 2019-03-19 DIAGNOSIS — Z7189 Other specified counseling: Secondary | ICD-10-CM

## 2019-03-19 LAB — BODY FLUID CULTURE: Culture: NO GROWTH

## 2019-03-19 LAB — BASIC METABOLIC PANEL
Anion gap: 14 (ref 5–15)
BUN: 10 mg/dL (ref 8–23)
CO2: 23 mmol/L (ref 22–32)
Calcium: 9.2 mg/dL (ref 8.9–10.3)
Chloride: 99 mmol/L (ref 98–111)
Creatinine, Ser: 0.42 mg/dL — ABNORMAL LOW (ref 0.44–1.00)
GFR calc Af Amer: >60 mL/min (ref >60)
GFR calc non Af Amer: >60 mL/min (ref >60)
Glucose, Bld: 97 mg/dL (ref 70–99)
Potassium: 4.2 mmol/L (ref 3.5–5.1)
Sodium: 136 mmol/L (ref 135–145)

## 2019-03-19 LAB — CBC
HCT: 47.4 % — ABNORMAL HIGH (ref 36.0–46.0)
Hemoglobin: 15.5 g/dL — ABNORMAL HIGH (ref 12.0–15.0)
MCH: 29.9 pg (ref 26.0–34.0)
MCHC: 32.7 g/dL (ref 30.0–36.0)
MCV: 91.3 fL (ref 80.0–100.0)
Platelets: UNDETERMINED 10*3/uL (ref 150–400)
RBC: 5.19 MIL/uL — ABNORMAL HIGH (ref 3.87–5.11)
RDW: 14.9 % (ref 11.5–15.5)
WBC: 12.4 10*3/uL — ABNORMAL HIGH (ref 4.0–10.5)
nRBC: 0 % (ref 0.0–0.2)

## 2019-03-19 LAB — CULTURE, BLOOD (ROUTINE X 2)
Culture: NO GROWTH
Culture: NO GROWTH
Special Requests: ADEQUATE
Special Requests: ADEQUATE

## 2019-03-19 MED ORDER — FUROSEMIDE 10 MG/ML IJ SOLN
20.0000 mg | Freq: Once | INTRAMUSCULAR | Status: AC
  Administered 2019-03-19: 20 mg via INTRAVENOUS
  Filled 2019-03-19: qty 2

## 2019-03-19 MED ORDER — GLYCOPYRROLATE 1 MG PO TABS
1.0000 mg | ORAL_TABLET | ORAL | Status: DC | PRN
  Filled 2019-03-19: qty 1

## 2019-03-19 MED ORDER — ONDANSETRON 4 MG PO TBDP: 4.0000 mg | ORAL_TABLET | Freq: Four times a day (QID) | ORAL | Status: DC | PRN

## 2019-03-19 MED ORDER — GLYCOPYRROLATE 0.2 MG/ML IJ SOLN
0.2000 mg | INTRAMUSCULAR | Status: DC | PRN
  Filled 2019-03-19: qty 1

## 2019-03-19 MED ORDER — HALOPERIDOL 0.5 MG PO TABS
0.5000 mg | ORAL_TABLET | ORAL | Status: DC | PRN
  Filled 2019-03-19: qty 1

## 2019-03-19 MED ORDER — LORAZEPAM 2 MG/ML IJ SOLN: 1.0000 mg | INTRAMUSCULAR | Status: DC | PRN

## 2019-03-19 MED ORDER — BIOTENE DRY MOUTH MT LIQD: 15.0000 mL | OROMUCOSAL | Status: DC | PRN

## 2019-03-19 MED ORDER — HALOPERIDOL LACTATE 2 MG/ML PO CONC
0.5000 mg | ORAL | Status: DC | PRN
  Filled 2019-03-19: qty 0.3

## 2019-03-19 MED ORDER — MORPHINE SULFATE (PF) 2 MG/ML IV SOLN
2.0000 mg | INTRAVENOUS | Status: DC | PRN
  Administered 2019-03-19: 2 mg via INTRAVENOUS
  Filled 2019-03-19: qty 1

## 2019-03-19 MED ORDER — POLYVINYL ALCOHOL 1.4 % OP SOLN
1.0000 [drp] | Freq: Four times a day (QID) | OPHTHALMIC | Status: DC | PRN
  Filled 2019-03-19: qty 15

## 2019-03-19 MED ORDER — HALOPERIDOL LACTATE 5 MG/ML IJ SOLN: 0.5000 mg | INTRAMUSCULAR | Status: DC | PRN

## 2019-03-19 MED ORDER — LORAZEPAM 2 MG/ML PO CONC: 1.0000 mg | ORAL | Status: DC | PRN

## 2019-03-19 MED ORDER — LORAZEPAM 1 MG PO TABS: 1.0000 mg | ORAL_TABLET | ORAL | Status: DC | PRN

## 2019-03-19 MED ORDER — ALUM & MAG HYDROXIDE-SIMETH 200-200-20 MG/5ML PO SUSP: 15.0000 mL | Freq: Four times a day (QID) | ORAL | 0 refills | Status: AC | PRN

## 2019-03-19 MED ORDER — ONDANSETRON HCL 4 MG/2ML IJ SOLN: 4.0000 mg | Freq: Four times a day (QID) | INTRAMUSCULAR | Status: DC | PRN

## 2019-03-19 NOTE — TOC Progression Note (Addendum)
Transition of Care [facility]) - Progression Note    Patient Details  Name: [name] MRN: [id number] Date of Birth: 1926/04/09  Transition of Care [facility]) CM/SW [place], LCSW Phone Number: 03/19/2019, 12:15 PM  Clinical Narrative:    Patient son agreeable to residential hospice facility- [facility]. CSW made a referral to [place] worker Harmon Pier. CSW will continue to follow this patient.   D/C Summary Faxed to: [phone].   Expected Discharge Plan: [place] Barriers to Discharge: Continued Medical Work up  Expected Discharge Plan and Services Expected Discharge Plan: [place] arrangements for the past 2 months: [place] Expected Discharge Date: 03/17/19                                   Social Determinants of Health (SDOH) Interventions    Readmission Risk Interventions No flowsheet data found.

## 2019-03-19 NOTE — Progress Notes (Signed)
Daily Progress Note   Patient Name: [name]       Date: 03/19/2019 DOB: 12/06/1925  Age: 85 y.o. MRN#: [id number] Attending Physician: Elodia [place]., * Primary Care Physician: Cassandria Anger, MD Admit Date: 03/14/2019  Reason for Consultation/Follow-up: Establishing goals of care  Subjective: I saw and examined [name] today.  She remains sleepy but arousable.  Seemed less interactive today.  Some moaning/verbalizations.  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 Benay Spice this AM and he would like for his mother to transition to [facility] for end of life care.  We discussed plans for transition to full comfort, including stopping shots for anticoagulation.  He was in agreement with plan and states his only goal is that she not suffer.  He feels that she has been moaning since he got here, and we discussed trial of pain medication to see if this improves..  Length of Stay: 5  Current Medications: Scheduled Meds:  . Chlorhexidine Gluconate Cloth  6 each Topical Q0600  . feeding supplement  1 Container Oral Q24H  . feeding supplement (ENSURE ENLIVE)  237 mL Oral BID BM  . latanoprost  1 drop Both Eyes QHS  . lidocaine  1 patch Transdermal Q24H  . mupirocin ointment  1 application Nasal BID    Continuous Infusions:   PRN Meds: acetaminophen, antiseptic oral rinse, glycopyrrolate **OR** glycopyrrolate **OR** glycopyrrolate, haloperidol **OR** haloperidol **OR** haloperidol lactate, ipratropium-albuterol, LORazepam **OR** LORazepam **OR** LORazepam, magnesium hydroxide, morphine injection, ondansetron **OR** ondansetron (ZOFRAN) IV, polyvinyl alcohol  Physical Exam         General: Sleepy but  arousable. Does not participate in conversation today.  Vocalizations/moaning at times throughout encounter. Heart: Regular rate and rhythm. No murmur appreciated. Lungs: Good air movement, clear Abdomen: Soft, nontender, nondistended, positive bowel sounds.  Ext: No significant edema Skin: Warm and dry  Vital Signs: BP 132/85 (BP Location: Right Arm)   Pulse (!) 110   Temp 97.7 F (36.5 C)   Resp (!) 22   Ht 5\' 3"  (1.6 m)   Wt 59 kg   SpO2 91%   BMI 23.03 kg/m  SpO2: SpO2: 91 % O2 Device: O2 Device: Nasal Cannula O2 Flow Rate: O2 Flow  Rate (L/min): 3 L/min  Intake/output summary:   Intake/Output Summary (Last 24 hours) at 03/19/2019 1305 Last data filed at 03/19/2019 0500 Gross per 24 hour  Intake 514.31 ml  Output 1100 ml  Net -585.69 ml   LBM: Last BM Date: 03/17/19 Baseline Weight: Weight: 59 kg Most recent weight: Weight: 59 kg       Palliative Assessment/Data:      Patient Active Problem List   Diagnosis Date Noted  . Malignant pleural effusion   . S/P thoracentesis   . Deep vein thrombosis (DVT) with pulmonary embolism present on admission ([place]) 03/17/2019  . Pressure injury of skin 03/16/2019  . Acute deep vein thrombosis (DVT) of left lower extremity ([place])   . Failure to thrive in adult   . Pleural effusion   . Other pulmonary embolism without acute cor pulmonale ([place])   . Chronic respiratory failure with hypoxia ([place])   . Hypoglobulinemia   . Immunosuppressed status ([place])   . Failure to thrive (0-17) 03/14/2019  . AKI (acute kidney injury) ([place]) 11/26/2018  . Rectus sheath hematoma 12/21/2017  . Altered mental status 12/16/2017  . Diarrhea 09/19/2017  . Constipation 09/12/2017  . Alzheimer disease ([place]) 07/25/2017  . Neck pain on left side 07/25/2017  . Urinary tract infection, recurrent 06/12/2017  . Thrush 02/10/2017  . Frequent urination 02/10/2017  . Osteoporosis 11/23/2016  . Abdominal pain 08/16/2016  . Urinary frequency 08/16/2016  .  Peripheral edema 08/16/2016  . General weakness 08/16/2016  . Gait disorder 08/16/2016  . Dysuria 08/03/2016  . Closed displaced simple supracondylar fracture of right humerus without intercondylar fracture 05/10/2016  . Urinary tract infection without hematuria 05/10/2016  . Syncope 05/10/2016  . Humeral head fracture, right, closed, initial encounter   . Burning tongue 03/25/2016  . Post concussion syndrome 03/03/2016  . Fall 03/03/2016  . Cystocele with prolapse 03/05/2015  . Dehydration 02/08/2015  . Hyponatremia 02/08/2015  . Well adult exam 10/01/2014  . Grief at loss of child 12/11/2013  . Irregular heart beat 09/29/2013  . Humeral head fracture 08/26/2013  . Actinic keratoses 08/26/2013  . Carotid bruit 05/02/2012  . Anxiety 03/17/2011  . ANEMIA, PERNICIOUS, HX OF 10/10/2007  . Peripheral neuropathy ([place]) 09/04/2007  . Chronic fatigue 08/06/2007  . CLL (chronic lymphocytic leukemia) ([place]) 06/17/2007  . Allergic rhinitis 06/17/2007  . OSTEOARTHRITIS 06/17/2007  . B12 deficiency 06/07/2007  . GERD (gastroesophageal reflux disease) 04/30/2007  . Dyslipidemia 12/19/2006  . Essential hypertension 12/19/2006  . DIVERTICULOSIS, COLON 05/19/2003  . COLONIC POLYPS, HX OF 01/10/2000    Palliative Care Assessment & Plan   Patient Profile: 85 yo female with history of CLL admitted with PE and pleural effusion which was drained.  Await cytology results.  Recommendations/Plan: - Son has spoken with oncology and would like to work to transition to [facility] for end of life care. - Pain: vocalizations noted during my exam.  Plan for trial of morphine prn. - Anxiety: ativan as needed - Agitation: Haldol as needed - SecretionsL Robinul as needed   Code Status:    Code Status Orders  (From admission, onward)         Start     Ordered   03/14/19 1656  Do not attempt resuscitation (DNR)  Continuous    Question Answer Comment  In the event of cardiac or respiratory  ARREST Do not call a "code blue"   In the event of cardiac or respiratory ARREST Do not perform Intubation, CPR,  defibrillation or ACLS   In the event of cardiac or respiratory ARREST Use medication by any route, position, wound care, and other measures to relive pain and suffering. May use oxygen, suction and manual treatment of airway obstruction as needed for comfort.      03/14/19 1655        Code Status History    Date Active Date Inactive Code Status Order ID Comments User Context   11/25/2018 1658 11/27/2018 1538 DNR [id number]  Damita Lack, MD ED   12/21/2017 0902 12/23/2017 1754 DNR [id number]  Mariel Aloe, MD Inpatient   12/20/2017 1558 12/20/2017 2007 DNR [id number]  Cristal Ford, DO Inpatient   12/16/2017 1844 12/20/2017 1558 Full Code [id number]  Velvet Bathe, MD Inpatient   05/10/2016 0211 05/11/2016 1754 Full Code [id number]  Lily Kocher, MD Inpatient   02/08/2015 0323 02/09/2015 2031 Full Code [id number]  Rise Patience, MD Inpatient   Advance Care Planning Activity    Advance Directive Documentation     Most Recent Value  Type of Advance Directive  Out of facility DNR (pink MOST or yellow form)  Pre-existing out of facility DNR order (yellow form or pink MOST form)  Yellow form placed in chart (order not valid for inpatient use)  "MOST" Form in Place?  -       Prognosis: - Ms. Minar appears weaker today and is less interactive.  She has some moaning and will need to continue to have medications titrated for her pain and other symptoms.  She is taking in mostly small sips at this point.  She had some of a Boost this AM, but no solid food for the last 2 days.  With a plan for transition to full comfort, including stopping anticoagulation with DVT/? PE, I agree with Dr. Gearldine Shown assessment that her prognosis is less than 2 weeks and she would be best served by transition to residential hospice if it can be arranged.  D/w Social Work.   Discharge Planning:   Hospice facility  Care plan was discussed with patient, RN  Thank you for allowing the Palliative Medicine Team to assist in the care of this patient.   Time In: 1100 Time Out: 1150 Total Time 50 Prolonged Time Billed No      Greater than 50%  of this time was spent counseling and coordinating care related to the above assessment and plan.  Micheline Rough, MD  Please contact Palliative Medicine Team phone at [phone] for questions and concerns.

## 2019-03-19 NOTE — Progress Notes (Addendum)
HEMATOLOGY-ONCOLOGY PROGRESS NOTE  SUBJECTIVE: [name] remains confused this morning.  Attempts to talk but difficult to understand.  When asked if she has any shortness of breath or pain, she denies this.  REVIEW OF SYSTEMS:   Unable to obtain review of systems secondary to patient condition  I have reviewed the past medical history, past surgical history, social history and family history with the patient and they are unchanged from previous note.   PHYSICAL EXAMINATION:  Vitals:   03/19/19 0910 03/19/19 0917  BP: (!) 146/85 140/77  Pulse: (!) 134 (!) 101  Resp: (!) 26 (!) 24  Temp: 97.8 F (36.6 C) 97.8 F (36.6 C)  SpO2: 95% 91%   Filed Weights   03/14/19 1715  Weight: 130 lb (59 kg)    Intake/Output from previous day: 08/25 0701 - 08/26 0700 In: 514.3 [P.O.:15; I.V.:499.3] Out: 1200 [Urine:1200]  GENERAL: Alert, appears tachypneic, unable to answer questions coherently LYMPH:  no palpable lymphadenopathy in the cervical, axillary or inguinal LUNGS: Diminished breath sounds, poor inspiratory effort HEART: regular rate & rhythm and no murmurs ABDOMEN:abdomen soft, non-tender and normal bowel sounds Musculoskeletal:no cyanosis of digits and no clubbing  NEURO: The patient mumbles and difficult to determine if she is oriented  LABORATORY DATA:  I have reviewed the data as listed CMP Latest Ref Rng & Units 03/19/2019 03/17/2019 03/14/2019  Glucose 70 - 99 mg/dL 97 81 143(H)  BUN 8 - 23 mg/dL 10 12 26(H)  Creatinine 0.44 - 1.00 mg/dL 0.42(L) 0.35(L) 0.63  Sodium 135 - 145 mmol/L 136 137 137  Potassium 3.5 - 5.1 mmol/L 4.2 4.1 5.1  Chloride 98 - 111 mmol/L 99 99 94(L)  CO2 22 - 32 mmol/L 23 27 33(H)  Calcium 8.9 - 10.3 mg/dL 9.2 9.3 10.4(H)  Total Protein 6.5 - 8.1 g/dL - - 7.0  Total Bilirubin 0.3 - 1.2 mg/dL - - 0.7  Alkaline Phos 38 - 126 U/L - - 106  AST 15 - 41 U/L - - 26  ALT 0 - 44 U/L - - 30    Lab Results  Component Value Date   WBC 12.4 (H)  03/19/2019   HGB 15.5 (H) 03/19/2019   HCT 47.4 (H) 03/19/2019   MCV 91.3 03/19/2019   PLT PLATELET CLUMPS NOTED ON SMEAR, UNABLE TO ESTIMATE 03/19/2019   NEUTROABS 10.2 (H) 03/17/2019    Ct Head Wo Contrast  Result Date: 03/14/2019 CLINICAL DATA:  Encephalopathy, pulmonary embolism EXAM: CT HEAD WITHOUT CONTRAST TECHNIQUE: Contiguous axial images were obtained from the base of the skull through the vertex without intravenous contrast. COMPARISON:  CT head March 10, 2016 FINDINGS: Brain: No evidence of acute infarction, hemorrhage, hydrocephalus, extra-axial collection or mass lesion/mass effect. Symmetric prominence of the ventricles, cisterns and sulci compatible with parenchymal volume loss. Patchy areas of white matter hypoattenuation are most compatible with chronic microvascular angiopathy. Vascular: Atherosclerotic calcification of the carotid siphons and intradural vertebral arteries. Skull: No calvarial fracture or suspicious osseous lesion. No scalp swelling or hematoma. Sinuses/Orbits: Solitary air-fluid level present in the right sphenoid sinus. Remainder of the paranasal sinuses are predominantly clear. Mastoid air cells are well aerated. Debris in the left external auditory canal. Prior lens extractions and benign scleral plaques are noted. Orbits are otherwise unremarkable. Other: None. IMPRESSION: 1. No acute intracranial abnormality. 2. Generalized parenchymal volume loss and chronic microvascular ischemic changes. 3. Intracranial atherosclerosis. 4. Solitary air-fluid level in the right sphenoid sinus. Correlate for acute sinusitis. Electronically Signed  By: Lovena Le M.D.   On: 03/14/2019 18:44   Ct Chest W Contrast  Result Date: 03/14/2019 CLINICAL DATA:  85 year old female with a pleural effusion EXAM: CT CHEST WITH CONTRAST TECHNIQUE: Multidetector CT imaging of the chest was performed during intravenous contrast administration. CONTRAST:  81mL OMNIPAQUE IOHEXOL 300 MG/ML   SOLN COMPARISON:  Chest x-ray today March 14, 2019, CT abdomen Dec 21, 2017 and Dec 16, 2017 FINDINGS: Cardiovascular: Heart: No cardiomegaly. No pericardial fluid/thickening. Coronary calcifications of the left anterior descending coronary artery. Aorta: Unremarkable course, caliber, contour of the thoracic aorta. No aneurysm or dissection flap. No periaortic fluid. Pulmonary arteries: The bolus timing is not optimized for evaluation of the pulmonary arteries. They were is a filling defect within the left main pulmonary artery extending into the segmental branches of the left lower lobe. This was not imaged on any of the recent CT chest. Mediastinum/Nodes: Unremarkable thoracic inlet. No supraclavicular or axillary adenopathy. Unremarkable thoracic esophagus. Leftward shift of the mediastinal structures and the heart. Lungs/Pleura: Right-sided pleural effusion contributes to complete collapse of the right upper lobe. Collapse of the right lower lobe, and near complete collapse of the right middle lobe. Airspace disease within the residual aerated segment of the right middle lobe. There are enhancing soft tissue foci on the parietal pleura at the right lung apex and at the anterior pleural space, concerning for metastatic implants. Mixed linear, ground-glass, and nodular opacities of the left upper lobe and the left lower lobe, which may represent atelectasis. No evidence of left-sided pleural effusion. Upper Abdomen: No acute. Musculoskeletal: Degenerative changes of the spine. No acute displaced fracture identified. Review of the MIP images confirms the above findings. IMPRESSION: Right-sided pleural effusion contributes to complete volume loss of the right upper lobe and right lower lobe, with near complete volume loss of the middle lobe. Volume of fluid contributes to leftward shift of the mediastinum and heart. Filling defect of the left main pulmonary artery extending into segmental branches compatible with  pulmonary embolism. Given the mural adherence of the filling defects, subacute thrombus is favored though there are no comparison studies. Enhancing nodularity of the right pleural surfaces suggestive of metastatic disease and malignant effusion. Coronary atherosclerosis. Electronically Signed   By: Corrie Mckusick D.O.   On: 03/14/2019 13:18   Dg Chest Port 1 View  Result Date: 03/14/2019 CLINICAL DATA:  Status post right-sided thoracentesis EXAM: PORTABLE CHEST 1 VIEW COMPARISON:  Film from earlier in the same day. FINDINGS: There is been significant reduction in right-sided pleural effusion following thoracentesis. No pneumothorax is noted. Small residual effusion is seen. The left lung is clear. Cardiac shadow is within normal limits. Old healed right humeral fracture is noted. IMPRESSION: No pneumothorax following right-sided thoracentesis. Significant reduction in right effusion is noted. Electronically Signed   By: Inez Catalina M.D.   On: 03/14/2019 17:10   Dg Chest Portable 1 View  Result Date: 03/14/2019 CLINICAL DATA:  Failure to thrive. History of chronic lymphocytic leukemia. EXAM: PORTABLE CHEST 1 VIEW COMPARISON:  Radiographs of September 16, 2017. FINDINGS: There is now noted nearly complete opacification of the right hemithorax with mild mediastinal shift to the left. This is most consistent with pleural effusion with possible associated atelectasis. Left lung is unremarkable. Atherosclerosis of thoracic aorta is noted. No pneumothorax is noted. Bony thorax is unremarkable. IMPRESSION: Nearly complete opacification of the right hemithorax is noted with mild mediastinal shift to the right most consistent with large pleural effusion and possibly  associated atelectasis. Aortic Atherosclerosis (ICD10-I70.0). Electronically Signed   By: Marijo Conception M.D.   On: 03/14/2019 10:05   Vas [place] Lower Extremity Venous (dvt)  Result Date: 03/16/2019  Lower Venous Study Indications: Pulmonary embolism.   Limitations: Patient confusion and inability to follow commands. Comparison Study: No prior study on file Performing Technologist: Sharion Dove RVS  Examination Guidelines: A complete evaluation includes B-mode imaging, spectral Doppler, color Doppler, and power Doppler as needed of all accessible portions of each vessel. Bilateral testing is considered an integral part of a complete examination. Limited examinations for reoccurring indications may be performed as noted.  +---------+---------------+---------+-----------+----------+--------------+ RIGHT    CompressibilityPhasicitySpontaneityPropertiesThrombus Aging +---------+---------------+---------+-----------+----------+--------------+ CFV      Full           Yes      Yes                                 +---------+---------------+---------+-----------+----------+--------------+ SFJ      Full                                                        +---------+---------------+---------+-----------+----------+--------------+ FV Prox  Full                                                        +---------+---------------+---------+-----------+----------+--------------+ FV Mid   Full                                                        +---------+---------------+---------+-----------+----------+--------------+ FV DistalFull                                                        +---------+---------------+---------+-----------+----------+--------------+ PFV      Full                                                        +---------+---------------+---------+-----------+----------+--------------+ POP      Full           Yes      Yes                                 +---------+---------------+---------+-----------+----------+--------------+ PTV      Full                                                        +---------+---------------+---------+-----------+----------+--------------+ PERO  Full                                                         +---------+---------------+---------+-----------+----------+--------------+   +---------+---------------+---------+-----------+----------+-------------------+ LEFT     CompressibilityPhasicitySpontaneityPropertiesThrombus Aging      +---------+---------------+---------+-----------+----------+-------------------+ CFV      Partial        No       No                   Acute               +---------+---------------+---------+-----------+----------+-------------------+ SFJ      Partial                                                          +---------+---------------+---------+-----------+----------+-------------------+ FV Prox  None                                                             +---------+---------------+---------+-----------+----------+-------------------+ FV Mid   None                                                             +---------+---------------+---------+-----------+----------+-------------------+ FV DistalNone                                                             +---------+---------------+---------+-----------+----------+-------------------+ PFV      None                                                             +---------+---------------+---------+-----------+----------+-------------------+ POP                                                   Not visualized      +---------+---------------+---------+-----------+----------+-------------------+ PTV                                                   visualized by color +---------+---------------+---------+-----------+----------+-------------------+ PERO  Not visualized      +---------+---------------+---------+-----------+----------+-------------------+ EIV                     No       No                   Acute                +---------+---------------+---------+-----------+----------+-------------------+ CIV                     Yes      Yes                                      +---------+---------------+---------+-----------+----------+-------------------+   Left Technical Findings: Not visualized segments include popliteal, peroneal, not all segments of the posterior tibial.   Summary: Right: There is no evidence of deep vein thrombosis in the lower extremity. Left: Findings consistent with acute deep vein thrombosis involving the left, external iliac vein, common femoral vein, left femoral vein, and left proximal profunda vein.  *See table(s) above for measurements and observations. Electronically signed by Monica Martinez MD on 03/16/2019 at 10:52:45 AM.    Final    US Thoracentesis Asp Pleural Space W/img Guide  Result Date: 03/14/2019 INDICATION: Patient with history of CLL, dyspnea, PE, failure to thrive, right pleural effusion. Request made for diagnostic and therapeutic right thoracentesis. EXAM: ULTRASOUND GUIDED DIAGNOSTIC AND THERAPEUTIC RIGHT THORACENTESIS MEDICATIONS: None COMPLICATIONS: None immediate. PROCEDURE: An ultrasound guided thoracentesis was thoroughly discussed with the patient's son and questions answered. The benefits, risks, alternatives and complications were also discussed. [name] understands and wishes to proceed with the procedure. Written consent was obtained. Ultrasound was performed to localize and mark an adequate pocket of fluid in the right chest. The area was then prepped and draped in the normal sterile fashion. 1% Lidocaine was used for local anesthesia. Under ultrasound guidance a 6 Fr Safe-T-Centesis catheter was introduced. Thoracentesis was performed. The catheter was removed and a dressing applied. FINDINGS: A total of approximately 1.9 liters of yellow fluid was removed. Samples were sent to the laboratory as requested by the clinical team. IMPRESSION: Successful  ultrasound guided diagnostic and therapeutic right thoracentesis yielding 1.9 liters of pleural fluid. Read by: Rowe Robert, PA-C Electronically Signed   By: Jacqulynn Cadet M.D.   On: 03/14/2019 16:35    ASSESSMENT AND PLAN: 1. Chronic lymphocytic leukemia, asymptomatic. 2. History of hypogammaglobulinemia with recurrent infections. She continues monthly IVIG replacement therapy. 3. History of recurrent urinary tract infections. 4. History of mild thrombocytopenia secondary to CLL. 5. Question right upper lobe nodule on chest x-ray 05/09/2016; follow-up chest x-ray 09/19/2016 with more confluent irregular nodular density right upper lobe measuring 3 x 1.8 cm. repeat chest x-ray 01/09/2017-regression of irregular nodular density in the right upper lobe felt to represent interval scarring. Stable on chest x-ray 05/01/2017 6.Admission 12/21/2017 with a right rectus hematoma, felt to be related to Lovenox received during the 12/16/2017 hospital admission    7.  Admission 03/14/2019 for failure to thrive, pulmonary embolism, and right pleural effusion.  [name] continues to decline.  Cytology shows atypical cells that are suspicious for malignancy, but overall this is nondiagnostic.  Her functional status continues to decline.  1.  Recommend that the patient pursue comfort measures.  An order has been placed for care management for evaluation for [facility]  place placement.  This has been discussed with her son who is in agreement with this plan. 2.  Recommend for hospice and palliative care team to review medications and discontinue medications that are no longer necessary.   LOS: 5 days   Mikey Bussing, DNP, AGPCNP-BC, AOCNP 03/19/19 [name] was interviewed and examined.  She remains confused.  The pleural fluid cytology is nondiagnostic.  I have a high clinical suspicion for malignancy.  I discussed the clinical  findings already resolved, and likelihood of a malignancy with her son.  He understands the poor prognosis.  He favors a comfort/supportive care approach as opposed to pursuing additional diagnostic and therapeutic interventions.  He agrees to a hospice referral and evaluation for [facility].  I feel she is a candidate for [facility].

## 2019-03-19 NOTE — Progress Notes (Signed)
Report called to [facility] at [facility]. Patient to be transported via [place]

## 2019-03-19 NOTE — Progress Notes (Signed)
Nutrition Brief Note  Chart reviewed. Pt now transitioning to comfort care.  No further nutrition interventions warranted at this time.   Marti Acebo, MS, RD, LDN [place] Inpatient Clinical Dietitian Pager: [phone] After Hours Pager: [phone]   

## 2019-03-19 NOTE — Discharge Summary (Signed)
Physician Discharge Summary  [name] [id number] DOB: 07-Nov-1925 DOA: 03/14/2019  PCP: Cassandria Anger, MD  Admit date: 03/14/2019 Discharge date: 03/19/2019  Time spent: 40 minutes  Recommendations for Outpatient Follow-up:  1. Comfort measures per [facility]   Discharge Diagnoses:  Principal Problem:   Deep vein thrombosis (DVT) with pulmonary embolism present on admission [facility]) Active Problems:   CLL (chronic lymphocytic leukemia) (HCC)   Failure to thrive (0-17)   Failure to thrive in adult   Pleural effusion   Other pulmonary embolism without acute cor pulmonale (HCC)   Chronic respiratory failure with hypoxia (HCC)   Hypoglobulinemia   Immunosuppressed status (HCC)   Pressure injury of skin   Acute deep vein thrombosis (DVT) of left lower extremity (HCC)   Malignant pleural effusion   S/P thoracentesis   Discharge Condition: stable  Diet recommendation: dysphagia 1, think liquid (see below)  Filed Weights   03/14/19 1715  Weight: 59 kg    History of present illness:  Per HPI [name] 85 y.o.femalewith medical history significant forCLL, dementia, hypertension, peripheral neuropathy, chronic constipation who presented to [facility] ED from SNF due to failure to thrive. Patient is unable to provide [name] history. History is obtained from [facility], her son in the room, and medical records. For the past week the patient has had minimal oral intake, not eating or drinking much. No specific complaints. No reported fever, chills, chest pain, abdominal pain, vomiting, or diarrhea. She is on 4 L by nasal cannula at baseline.   ED Course:Lethargic on presentation, vital signs remarkable for tachycardia with heart rate in the 110's. Lab studies remarkable for leukocytosis with WBC 13 K. UA pending. Chest x-ray personally reviewed showed large right pleural effusion completely obstructing right lung. Personally obtainedconsent from patient's son at bedside  for thoracentesis. IR right thoracentesis yielded 1.9 L of yellow fluid. Fluid sent for cytology. Highly appreciate IR assistance in the care of this patient. TRH wasasked to admit.  She was found to have R sided pleural effusion as well as pulmonary embolism with left sided DVT.  She had thoracentesis with atypical cells suspicious for malignancy and pleural changes concerning for metastasis.  She was placed on heparin for VTE and transitioned to lovenox.  Oncology was consulted and recommended comfort care and hospice.  Pt to be discharged [facility] on 8/26.    Further details below  Hospital Course:  # Large right pleural effusion suspect malignant  Pleural effusion  -status post right thoracentesis by IR on 03/14/2019  - Thoracentesis yielded 1.9 L of yellow fluid,  -  Cytology showing atypical cells "susipicious for malignancy" -  CT showed pleural changes suspicious for metastases  -  Pleural fluid culture is negative - per ONC Dr Benay Spice this not likely due to CLL - per ONC Dr Benay Spice pleural fluid nondiagnostic, but high clinical suspicion for malignancy.  Oncology discussed with pt son who understood poor prognosis and was in favor of comfort/supportive care approach.  Agreeable to hospice and [facility].    FTT/ Dementia- is DNR; is [name]  SNF [place] resident Son reports he last saw patient two months ago, patient was able to walk with [name] walker two months ago  # Acute/subacute PE/acute DVT left lower extremity: -CT chest on presentation showed "Filling defect of the left main pulmonary artery extending into segmental branches compatible with pulmonary embolism. Given the mural adherence of the filling defects, subacute thrombus is favored though  there are no comparison studies." -Echo lvef wnl, Right Ventricle: The right ventricle has mildly reduced systolic function. The cavity was mildly enlarged. There is no increase in right ventricular wall thickness. Right  ventricular systolic pressure is mildly elevated with an estimated pressure of 34.4 mmHg. -venous doppler "acute deep vein thrombosis involving the left, external iliac vein, common femoral vein, left femoral vein, and left proximal profunda vein -On 4liter o2, does not appear in respiratory distress - given transition to comfort measures and plan for [facility] anticoagulation has been d/c'd.  Discussed with pt son.   # Chronic hypoxic respiratory failure She is on 4 L oxygen supplementation at baseline  # Leukocytosis with elevated hemoglobin , likely from dehydration  On hydration Blood culture no growth Add on pleural fluids culture and gram stain  UA clear, urine culture with multiple, nonpredominant   Comfort measures as noted above  # Acute metabolic encephalopathy -on hydration , treating underline acute issues, rule out infection, slightly more alert today -she more awake on 8/23, speech recommends >>  Dysphagia 1 (Puree);Thin liquid Liquid Administration via: Cup;Straw Medication Administration: Whole meds with puree Supervision: Full supervision/cueing for compensatory strategies Compensations: Minimize environmental distractions;Slow rate;Small sips/bites(feed only when fully alert/awake) Postural Changes: Seated upright at 90 degrees -palliative care consulted for goals of care, they are aware  HTN: comfort measures  Hypogammaglobulinemia /immunosuppressed status: with recurrent infections (UTIs). She continues monthly IVIG replacement therapy, last received on 7/20 Followed by Dr Benay Spice   CLL, asymptomatic Followed by Dr Benay Spice   Chronic constipation Resume outpatient medications  Stage II sacral decubitus ulcer, presents on admission Skin care  Procedures: Thoracentesis 8/26 by IR  Consultations:  IR  Palliative care  Oncology  Discharge Exam: Vitals:   03/19/19 1256 03/19/19 1348  BP: 132/85 133/78  Pulse: (!) 110 (!) 112   Resp: (!) 22 (!) 22  Temp: 97.7 F (36.5 C) 98 F (36.7 C)  SpO2: 91% 91%   Able to answer some simple questions, but very difficult to understand Discussed with son, plan for discharge to hospice, agreeable  General: No acute distress, appears stated age, elderly, chronically ill appearing Cardiovascular: Heart sounds show Jaiven Graveline regular rate, and rhythm.  Lungs: Clear to auscultation bilaterally Abdomen: Soft, nontender, nondistended Neurological: Awakens, but difficult to understand.  Able to answer some simple questions. Skin: Warm and dry. No rashes or lesions. Extremities: No clubbing or cyanosis. No edema.  Discharge Instructions   Discharge Instructions    Discharge diet:   Complete by: As directed    Recommendations  Diet recommendations: Dysphagia 1 (puree);Thin liquid Liquids provided via: Straw Medication Administration: Whole meds with puree(crush if large) Supervision: Staff to assist with self feeding Compensations: Minimize environmental distractions;Slow rate;Small sips/bites;Other (Comment);Lingual sweep for clearance of pocketing(feed only when fully alert/awake, check for oral residuals after po) Postural Changes and/or Swallow Maneuvers: Seated upright 90 degrees;Upright 30-60 min after meal            Oral Care Recommendations: Oral care BID Follow up Recommendations: Skilled Nursing facility SLP Visit Diagnosis: Dysphagia, oral phase (R13.11) Plan: Continue with current plan of care    Discharge instructions   Complete by: As directed    You had pleural effusion which was most likely due to cancer.  We're planning for inpatient hospice based on your overall clinical status after Cacie Gaskins discussion with oncology and palliative care.     Allergies as of 03/19/2019      Reactions  Pneumococcal Vaccines Swelling, Rash   Pneumococcal Vaccine-23 valent   Clarithromycin Other (See Comments)   REACTION: sore mouth   Hctz [hydrochlorothiazide]     Low Na   Oxycodone-aspirin Other (See Comments)   REACTION: horrible nightmares   Propoxyphene N-acetaminophen Other (See Comments)   REACTION: couldn't wake her up      Medication List    TAKE these medications   acetaminophen 325 MG tablet Commonly known as: TYLENOL Take 650 mg by mouth 2 (two) times Milli [place]ridge day.   alum & mag hydroxide-simeth 200-200-20 MG/5ML suspension Commonly known as: Mi-Acid Take 15 mLs by mouth every 6 (six) hours as needed for indigestion or heartburn. What changed:   how much to take  when to take this   benzonatate 100 MG capsule Commonly known as: TESSALON Take 100 mg by mouth 3 (three) times daily as needed for cough.   calcium carbonate 500 MG chewable tablet Commonly known as: TUMS - dosed in mg elemental calcium Chew 500 mg by mouth every 4 (four) hours as needed for indigestion or heartburn.   CertaVite/Antioxidants Tabs Take 1 tablet by mouth daily.   cromolyn 4 % ophthalmic solution Commonly known as: OPTICROM Place 1 drop into both eyes 4 (four) times daily.   cyanocobalamin 1000 MCG/ML injection Commonly known as: (VITAMIN B-12) ADMINISTER 1 CC SUBQ EVERY 4 WEEKS AS DIRECTED   Dexilant 30 MG capsule Generic drug: Dexlansoprazole Take 30 mg by mouth daily.   fluticasone 50 MCG/ACT nasal spray Commonly known as: FLONASE Place 2 sprays into both nostrils daily.   folic acid 1 MG tablet Commonly known as: FOLVITE Take 1 mg by mouth daily. For restless legs   gabapentin 100 MG capsule Commonly known as: NEURONTIN Take 100 mg by mouth 2 (two) times Khole [place] day.   ipratropium-albuterol 0.5-2.5 (3) MG/3ML Soln Commonly known as: DUONEB Take 3 mLs by nebulization every 6 (six) hours as needed (SOB/Wheezing).   latanoprost 0.005 % ophthalmic solution Commonly known as: XALATAN PLACE 1 DROP INTO BOTH EYES AT BEDTIME.   lidocaine 5 % Commonly known as: LIDODERM Place 1 patch onto the skin daily. Remove & Discard patch within 12 hours  or as directed by MD   loratadine 10 MG tablet Commonly known as: CLARITIN TAKE 1 TABLET (10 MG TOTAL) BY MOUTH DAILY. What changed: See the new instructions.   magnesium hydroxide 400 MG/5ML suspension Commonly known as: MILK OF MAGNESIA Take 30 mLs by mouth daily as needed for mild constipation.   mirabegron ER 50 MG Tb24 tablet Commonly known as: Myrbetriq Take 1 tablet (50 mg total) by mouth daily.   Namzaric 28-10 MG Cp24 Generic drug: Memantine HCl-Donepezil HCl Take 1 capsule by mouth daily.   phenol 1.4 % Liqd Commonly known as: CHLORASEPTIC Use as directed 2 sprays in the mouth or throat 4 (four) times daily as needed for throat irritation / pain.   polyethylene glycol 17 g packet Commonly known as: MIRALAX / GLYCOLAX Take 17 g by mouth daily. What changed:   when to take this  reasons to take this   senna-docusate 8.6-50 MG tablet Commonly known as: Senokot-S Take 2 tablets by mouth 2 (two) times daily.   sucralfate 1 g tablet Commonly known as: CARAFATE Take 1 g by mouth 4 (four) times daily -  before meals and at bedtime.   traZODone 50 MG tablet Commonly known as: DESYREL Take 0.5 tablets (25 mg total) by mouth at bedtime.   cholecalciferol 25 MCG (  1000 UT) tablet Commonly known as: VITAMIN D Take 2,000 Units by mouth daily.   Vitamin D3 50 MCG (2000 UT) capsule Take 1 capsule (2,000 Units total) by mouth daily.      Allergies  Allergen Reactions  . Pneumococcal Vaccines Swelling and Rash    Pneumococcal Vaccine-23 valent  . Clarithromycin Other (See Comments)    REACTION: sore mouth  . Hctz [Hydrochlorothiazide]     Low Na  . Oxycodone-Aspirin Other (See Comments)    REACTION: horrible nightmares  . Propoxyphene N-Acetaminophen Other (See Comments)    REACTION: couldn't wake her up   Contact information for after-discharge care    Destination    [facility] .   Service: Skilled Nursing Contact  information: [place] [phone]               The results of significant diagnostics from this hospitalization (including imaging, microbiology, ancillary and laboratory) are listed below for reference.    Significant Diagnostic Studies: Ct Head Wo Contrast  Result Date: 03/14/2019 CLINICAL DATA:  Encephalopathy, pulmonary embolism EXAM: CT HEAD WITHOUT CONTRAST TECHNIQUE: Contiguous axial images were obtained from the base of the skull through the vertex without intravenous contrast. COMPARISON:  CT head March 10, 2016 FINDINGS: Brain: No evidence of acute infarction, hemorrhage, hydrocephalus, extra-axial collection or mass lesion/mass effect. Symmetric prominence of the ventricles, cisterns and sulci compatible with parenchymal volume loss. Patchy areas of white matter hypoattenuation are most compatible with chronic microvascular angiopathy. Vascular: Atherosclerotic calcification of the carotid siphons and intradural vertebral arteries. Skull: No calvarial fracture or suspicious osseous lesion. No scalp swelling or hematoma. Sinuses/Orbits: Solitary air-fluid level present in the right sphenoid sinus. Remainder of the paranasal sinuses are predominantly clear. Mastoid air cells are well aerated. Debris in the left external auditory canal. Prior lens extractions and benign scleral plaques are noted. Orbits are otherwise unremarkable. Other: None. IMPRESSION: 1. No acute intracranial abnormality. 2. Generalized parenchymal volume loss and chronic microvascular ischemic changes. 3. Intracranial atherosclerosis. 4. Solitary air-fluid level in the right sphenoid sinus. Correlate for acute sinusitis. Electronically Signed   By: Lovena Le M.D.   On: 03/14/2019 18:44   Ct Chest W Contrast  Result Date: 03/14/2019 CLINICAL DATA:  85 year old female with Jahmad Petrich pleural effusion EXAM: CT CHEST WITH CONTRAST TECHNIQUE: Multidetector CT imaging of the chest  was performed during intravenous contrast administration. CONTRAST:  84mL OMNIPAQUE IOHEXOL 300 MG/ML  SOLN COMPARISON:  Chest x-ray today March 14, 2019, CT abdomen Dec 21, 2017 and Dec 16, 2017 FINDINGS: Cardiovascular: Heart: No cardiomegaly. No pericardial fluid/thickening. Coronary calcifications of the left anterior descending coronary artery. Aorta: Unremarkable course, caliber, contour of the thoracic aorta. No aneurysm or dissection flap. No periaortic fluid. Pulmonary arteries: The bolus timing is not optimized for evaluation of the pulmonary arteries. They were is Brodey Bonn filling defect within the left main pulmonary artery extending into the segmental branches of the left lower lobe. This was not imaged on any of the recent CT chest. Mediastinum/Nodes: Unremarkable thoracic inlet. No supraclavicular or axillary adenopathy. Unremarkable thoracic esophagus. Leftward shift of the mediastinal structures and the heart. Lungs/Pleura: Right-sided pleural effusion contributes to complete collapse of the right upper lobe. Collapse of the right lower lobe, and near complete collapse of the right middle lobe. Airspace disease within the residual aerated segment of the right middle lobe. There are enhancing soft tissue foci on the parietal pleura at the right lung apex  and at the anterior pleural space, concerning for metastatic implants. Mixed linear, ground-glass, and nodular opacities of the left upper lobe and the left lower lobe, which may represent atelectasis. No evidence of left-sided pleural effusion. Upper Abdomen: No acute. Musculoskeletal: Degenerative changes of the spine. No acute displaced fracture identified. Review of the MIP images confirms the above findings. IMPRESSION: Right-sided pleural effusion contributes to complete volume loss of the right upper lobe and right lower lobe, with near complete volume loss of the middle lobe. Volume of fluid contributes to leftward shift of the mediastinum and  heart. Filling defect of the left main pulmonary artery extending into segmental branches compatible with pulmonary embolism. Given the mural adherence of the filling defects, subacute thrombus is favored though there are no comparison studies. Enhancing nodularity of the right pleural surfaces suggestive of metastatic disease and malignant effusion. Coronary atherosclerosis. Electronically Signed   By: Corrie Mckusick D.O.   On: 03/14/2019 13:18   Dg Chest Port 1 View  Result Date: 03/14/2019 CLINICAL DATA:  Status post right-sided thoracentesis EXAM: PORTABLE CHEST 1 VIEW COMPARISON:  Film from earlier in the same day. FINDINGS: There is been significant reduction in right-sided pleural effusion following thoracentesis. No pneumothorax is noted. Small residual effusion is seen. The left lung is clear. Cardiac shadow is within normal limits. Old healed right humeral fracture is noted. IMPRESSION: No pneumothorax following right-sided thoracentesis. Significant reduction in right effusion is noted. Electronically Signed   By: Inez Catalina M.D.   On: 03/14/2019 17:10   Dg Chest Portable 1 View  Result Date: 03/14/2019 CLINICAL DATA:  Failure to thrive. History of chronic lymphocytic leukemia. EXAM: PORTABLE CHEST 1 VIEW COMPARISON:  Radiographs of September 16, 2017. FINDINGS: There is now noted nearly complete opacification of the right hemithorax with mild mediastinal shift to the left. This is most consistent with pleural effusion with possible associated atelectasis. Left lung is unremarkable. Atherosclerosis of thoracic aorta is noted. No pneumothorax is noted. Bony thorax is unremarkable. IMPRESSION: Nearly complete opacification of the right hemithorax is noted with mild mediastinal shift to the right most consistent with large pleural effusion and possibly associated atelectasis. Aortic Atherosclerosis (ICD10-I70.0). Electronically Signed   By: Marijo Conception M.D.   On: 03/14/2019 10:05   Vas [place] Lower  Extremity Venous (dvt)  Result Date: 03/16/2019  Lower Venous Study Indications: Pulmonary embolism.  Limitations: Patient confusion and inability to follow commands. Comparison Study: No prior study on file Performing Technologist: Sharion Dove RVS  Examination Guidelines: Aneth Schlagel complete evaluation includes B-mode imaging, spectral Doppler, color Doppler, and power Doppler as needed of all accessible portions of each vessel. Bilateral testing is considered an integral part of Ezriel Boffa complete examination. Limited examinations for reoccurring indications may be performed as noted.  +---------+---------------+---------+-----------+----------+--------------+ RIGHT    CompressibilityPhasicitySpontaneityPropertiesThrombus Aging +---------+---------------+---------+-----------+----------+--------------+ CFV      Full           Yes      Yes                                 +---------+---------------+---------+-----------+----------+--------------+ SFJ      Full                                                        +---------+---------------+---------+-----------+----------+--------------+  FV Prox  Full                                                        +---------+---------------+---------+-----------+----------+--------------+ FV Mid   Full                                                        +---------+---------------+---------+-----------+----------+--------------+ FV DistalFull                                                        +---------+---------------+---------+-----------+----------+--------------+ PFV      Full                                                        +---------+---------------+---------+-----------+----------+--------------+ POP      Full           Yes      Yes                                 +---------+---------------+---------+-----------+----------+--------------+ PTV      Full                                                         +---------+---------------+---------+-----------+----------+--------------+ PERO     Full                                                        +---------+---------------+---------+-----------+----------+--------------+   +---------+---------------+---------+-----------+----------+-------------------+ LEFT     CompressibilityPhasicitySpontaneityPropertiesThrombus Aging      +---------+---------------+---------+-----------+----------+-------------------+ CFV      Partial        No       No                   Acute               +---------+---------------+---------+-----------+----------+-------------------+ SFJ      Partial                                                          +---------+---------------+---------+-----------+----------+-------------------+ FV Prox  None                                                             +---------+---------------+---------+-----------+----------+-------------------+  FV Mid   None                                                             +---------+---------------+---------+-----------+----------+-------------------+ FV DistalNone                                                             +---------+---------------+---------+-----------+----------+-------------------+ PFV      None                                                             +---------+---------------+---------+-----------+----------+-------------------+ POP                                                   Not visualized      +---------+---------------+---------+-----------+----------+-------------------+ PTV                                                   visualized by color +---------+---------------+---------+-----------+----------+-------------------+ PERO                                                  Not visualized      +---------+---------------+---------+-----------+----------+-------------------+ EIV                      No       No                   Acute               +---------+---------------+---------+-----------+----------+-------------------+ CIV                     Yes      Yes                                      +---------+---------------+---------+-----------+----------+-------------------+   Left Technical Findings: Not visualized segments include popliteal, peroneal, not all segments of the posterior tibial.   Summary: Right: There is no evidence of deep vein thrombosis in the lower extremity. Left: Findings consistent with acute deep vein thrombosis involving the left, external iliac vein, common femoral vein, left femoral vein, and left proximal profunda vein.  *See table(s) above for measurements and observations. Electronically signed by Monica Martinez MD on 03/16/2019 at 10:52:45 AM.    Final    US Thoracentesis Asp Pleural Space W/img Guide  Result Date: 03/14/2019 INDICATION: Patient with history of  CLL, dyspnea, PE, failure to thrive, right pleural effusion. Request made for diagnostic and therapeutic right thoracentesis. EXAM: ULTRASOUND GUIDED DIAGNOSTIC AND THERAPEUTIC RIGHT THORACENTESIS MEDICATIONS: None COMPLICATIONS: None immediate. PROCEDURE: An ultrasound guided thoracentesis was thoroughly discussed with the patient's son and questions answered. The benefits, risks, alternatives and complications were also discussed. [name] understands and wishes to proceed with the procedure. Written consent was obtained. Ultrasound was performed to localize and mark an adequate pocket of fluid in the right chest. The area was then prepped and draped in the normal sterile fashion. 1% Lidocaine was used for local anesthesia. Under ultrasound guidance Malina Geers 6 Fr Safe-T-Centesis catheter was introduced. Thoracentesis was performed. The catheter was removed and Jabe Jeanbaptiste dressing applied. FINDINGS: Rogerick Baldwin total of approximately 1.9 liters of yellow fluid was removed. Samples were sent to the  laboratory as requested by the clinical team. IMPRESSION: Successful ultrasound guided diagnostic and therapeutic right thoracentesis yielding 1.9 liters of pleural fluid. Read by: Rowe Robert, PA-C Electronically Signed   By: Jacqulynn Cadet M.D.   On: 03/14/2019 16:35    Microbiology: Recent Results (from the past 240 hour(s))  SARS Coronavirus 2 [facility] order, Performed in [facility] hospital lab) Nasopharyngeal Nasopharyngeal Swab     Status: None   Collection Time: 03/14/19  9:36 AM   Specimen: Nasopharyngeal Swab  Result Value Ref Range Status   SARS Coronavirus 2 NEGATIVE NEGATIVE Final    Comment: (NOTE) If result is NEGATIVE SARS-CoV-2 target nucleic acids are NOT DETECTED. The SARS-CoV-2 RNA is generally detectable in upper and lower  respiratory specimens during the acute phase of infection. The lowest  concentration of SARS-CoV-2 viral copies this assay can detect is 250  copies / mL. Krishang Reading negative result does not preclude SARS-CoV-2 infection  and should not be used as the sole basis for treatment or other  patient management decisions.  Elisavet Buehrer negative result may occur with  improper specimen collection / handling, submission of specimen other  than nasopharyngeal swab, presence of viral mutation(s) within the  areas targeted by this assay, and inadequate number of viral copies  (<250 copies / mL). Tayshawn Purnell negative result must be combined with clinical  observations, patient history, and epidemiological information. If result is POSITIVE SARS-CoV-2 target nucleic acids are DETECTED. The SARS-CoV-2 RNA is generally detectable in upper and lower  respiratory specimens dur ing the acute phase of infection.  Positive  results are indicative of active infection with SARS-CoV-2.  Clinical  correlation with patient history and other diagnostic information is  necessary to determine patient infection status.  Positive results do  not rule out bacterial infection or co-infection with  other viruses. If result is PRESUMPTIVE POSTIVE SARS-CoV-2 nucleic acids MAY BE PRESENT.   Tyechia Allmendinger presumptive positive result was obtained on the submitted specimen  and confirmed on repeat testing.  While 2019 novel coronavirus  (SARS-CoV-2) nucleic acids may be present in the submitted sample  additional confirmatory testing may be necessary for epidemiological  and / or clinical management purposes  to differentiate between  SARS-CoV-2 and other Sarbecovirus currently known to infect humans.  If clinically indicated additional testing with an alternate test  methodology [phone]) is advised. The SARS-CoV-2 RNA is generally  detectable in upper and lower respiratory sp ecimens during the acute  phase of infection. The expected result is Negative. Fact Sheet for Patients:  StrictlyIdeas.no Fact Sheet for Healthcare Providers: BankingDealers.co.za This test is not yet approved or cleared by the [place] FDA and has  been authorized for detection and/or diagnosis of SARS-CoV-2 by FDA under an Emergency Use Authorization (EUA).  This EUA will remain in effect (meaning this test can be used) for the duration of the COVID-19 declaration under Section 564(b)(1) of the Act, 21 U.S.C. section 360bbb-3(b)(1), unless the authorization is terminated or revoked sooner. Performed at [facility], [place] [address], [place], [place] [address]   Culture, blood (routine x 2)     Status: None   Collection Time: 03/14/19  6:44 PM   Specimen: BLOOD  Result Value Ref Range Status   Specimen Description   Final    BLOOD RIGHT ANTECUBITAL Performed at [place] [address], [place], [place] [address]    Special Requests   Final    BOTTLES DRAWN AEROBIC AND ANAEROBIC Blood Culture adequate volume Performed at [place] [address] [place], [place] [address]    Culture   Final    NO  GROWTH 5 DAYS Performed at [facility], [place] [address] [place], [place] [address]    Report Status 03/19/2019 FINAL  Final  Culture, blood (routine x 2)     Status: None   Collection Time: 03/14/19  6:44 PM   Specimen: BLOOD  Result Value Ref Range Status   Specimen Description   Final    BLOOD LEFT ANTECUBITAL Performed at [place] [address] [place], [place] [address]    Special Requests   Final    BOTTLES DRAWN AEROBIC AND ANAEROBIC Blood Culture adequate volume Performed at [place] [address], [place], [place] [address]    Culture   Final    NO GROWTH 5 DAYS Performed at [facility], [place] [address] [place], [place] [address]    Report Status 03/19/2019 FINAL  Final  Body fluid culture     Status: None   Collection Time: 03/15/19 11:04 AM   Specimen: Pleura; Body Fluid  Result Value Ref Range Status   Specimen Description   Final    PLEURAL RIGHT Performed at [place] [address], [place], [place] [address]    Special Requests   Final    Immunocompromised Performed at [facility], [place] [address], [place], [place] [address]    Gram Stain   Final    RARE WBC PRESENT, PREDOMINANTLY MONONUCLEAR NO ORGANISMS SEEN    Culture   Final    NO GROWTH 3 DAYS Performed at [facility], [place] [address], [place],  [address]    Report Status 03/19/2019 FINAL  Final  Culture, Urine     Status: None   Collection Time: 03/15/19 11:19 AM   Specimen: Urine, Clean Catch  Result Value Ref Range Status   Specimen Description   Final    URINE, CLEAN CATCH Performed at [facility], [place] [address] [place],  [address]    Special Requests   Final    NONE Performed at [facility], [place] [address], [place],  [address]    Culture   Final    Multiple bacterial morphotypes present, none predominant. Suggest  appropriate recollection if clinically indicated.   Report Status 03/16/2019 FINAL  Final  MRSA PCR Screening     Status: Abnormal   Collection Time: 03/16/19  5:19 AM   Specimen: Nasopharyngeal  Result Value Ref Range Status   MRSA by PCR POSITIVE (Saisha Hogue) NEGATIVE Final  Comment:        The GeneXpert MRSA Assay (FDA approved for NASAL specimens only), is one component of Eiliyah Reh comprehensive MRSA colonization surveillance program. It is not intended to diagnose MRSA infection nor to guide or monitor treatment for MRSA infections. RESULT CALLED TO, READ BACK BY AND VERIFIED WITH: T Andrey Campanile 03/16/19 [place] Performed at [facility], [place] [address], [place], [place] [address]      Labs: Basic Metabolic Panel: Recent Labs  Lab 03/14/19 0948 03/17/19 0648 03/19/19 0546  NA 137 137 136  K 5.1 4.1 4.2  CL 94* 99 99  CO2 33* 27 23  GLUCOSE 143* 81 97  BUN 26* 12 10  CREATININE 0.63 0.35* 0.42*  CALCIUM 10.4* 9.3 9.2  MG  --  2.0  --    Liver Function Tests: Recent Labs  Lab 03/14/19 0948  AST 26  ALT 30  ALKPHOS 106  BILITOT 0.7  PROT 7.0  ALBUMIN 2.6*   No results for input(s): LIPASE, AMYLASE in the last 168 hours. No results for input(s): AMMONIA in the last 168 hours. CBC: Recent Labs  Lab 03/14/19 0948 03/15/19 0537 03/17/19 0648 03/19/19 0546  WBC 13.4* 15.1* 13.3* 12.4*  NEUTROABS 9.7*  --  10.2*  --   HGB 16.7* 17.1* 14.4 15.5*  HCT 53.0* 53.5* 45.4 47.4*  MCV 95.5 93.0 95.2 91.3  PLT 193 157 147* PLATELET CLUMPS NOTED ON SMEAR, UNABLE TO ESTIMATE   Cardiac Enzymes: No results for input(s): CKTOTAL, CKMB, CKMBINDEX, TROPONINI in the last 168 hours. BNP: BNP (last 3 results) No results for input(s): BNP in the last 8760 hours.  ProBNP (last 3 results) No results for input(s): PROBNP in the last 8760 hours.  CBG: Recent Labs  Lab 03/15/19 1110 03/15/19 1631 03/15/19 2042 03/16/19 0731 03/16/19 1652  GLUCAP 218* 193*  204* 141* 96       Signed:  Fayrene Helper MD.  [facility] 03/19/2019, 2:50 PM

## 2019-03-19 NOTE — Progress Notes (Addendum)
   03/19/19 0504  Vitals  Temp 98 F (36.7 C)  Temp Source Oral  BP (!) 142/83  MAP (mmHg) 101  BP Location Right Arm  BP Method Automatic  Patient Position (if appropriate) Lying  Pulse Rate 99  Pulse Rate Source Monitor  Resp (!) 40  Oxygen Therapy  SpO2 92 %  O2 Device Nasal Cannula  MEWS Score  MEWS RR 3  MEWS Pulse 0  MEWS Systolic 0  MEWS LOC 0  MEWS Temp 0  MEWS Score 3  MEWS Score Color Yellow  MEWS Assessment  Is this an acute change? Yes  MEWS guidelines implemented *See [place]  Provider Notification  Provider Name/Title [facility]  Date Provider Notified 03/19/19  Time Provider Notified [phone]  Notification Type Page  Notification Reason Other (Comment) (RR 40)  Response See new orders (Lasix 20 mg IV)  Date of Provider Response 03/19/19  Time of Provider Response [phone]   Additional order rec'd to D/C IV fluids (1/2 NS @ 46)

## 2019-03-19 NOTE — Progress Notes (Addendum)
[facility] room available today for patient. Meeting son at 2:00 to complete paper work for transfer today. Please send discharge summary to [phone]. RN Please call report to [phone].   Received request from [place] for family interest in [facility]. Chart under review. Will update Elmyra Ricks and family regarding availability and eligibility soon.   Thank you, Erling Conte, LCSW [phone]

## 2019-03-19 NOTE — Progress Notes (Signed)
PT Cancellation Note  Patient Details Name: [name] MRN: [id number] DOB: September 19, 1925   Cancelled Treatment:    Reason Eval/Treat Not Completed: Other (comment)  Pt going hospice, probably [facility].  PT signing off.  Galen Manila 03/19/2019, 12:27 PM

## 2019-03-20 ENCOUNTER — Telehealth: Payer: Self-pay | Admitting: *Deleted

## 2019-03-25 NOTE — Telephone Encounter (Signed)
Pt was on TCM report admitted 03/14/19 failure to thrive. Patient is unable to provide a history.History is obtained from , her son in the room, and medical records. For the past week the patient has had minimal oral intake, not eating or drinking much. She was found to have R sided pleural effusion as well as pulmonary embolism with left sided DVT.  She had thoracentesis with atypical cells suspicious for malignancy and pleural changes concerning for metastasis.  She was placed on heparin for VTE and transitioned to lovenox.  Oncology was consulted and recommended comfort care and hospice.  Pt to be discharged 03/19/19 to [facility] for comfort measures.Marland KitchenJohny Chess

## 2019-03-25 DEATH — deceased

## 2019-04-07 ENCOUNTER — Inpatient Hospital Stay: Payer: Medicare Other

## 2019-05-05 ENCOUNTER — Other Ambulatory Visit: Payer: Medicare Other

## 2019-05-05 ENCOUNTER — Ambulatory Visit: Payer: Medicare Other

## 2019-05-05 ENCOUNTER — Ambulatory Visit: Payer: Medicare Other | Admitting: Oncology

## 2019-07-03 IMAGING — CT CT ABD-PELV W/ CM
2 of 5 series · 17 of 46 positions shown, 19 images · IV contrast (ISOVUE)
Comparison: 08/24/2016

CLINICAL DATA: Weakness, lethargy, previous UTI,  suprapubic pain

EXAM:
CT ABDOMEN AND PELVIS WITH CONTRAST
TECHNIQUE: Multidetector CT imaging of the abdomen and pelvis was performed
using the standard protocol following bolus administration of
intravenous contrast.
CONTRAST:  100mL XSA4MJ-III IOPAMIDOL (XSA4MJ-III) INJECTION 61%

[Series 2: axial st · axial · 0.77mm/px · z∈[-763,-378]mm · 14 of 89 slices shown, 16 images]
[im 6/89  soft-tissue]
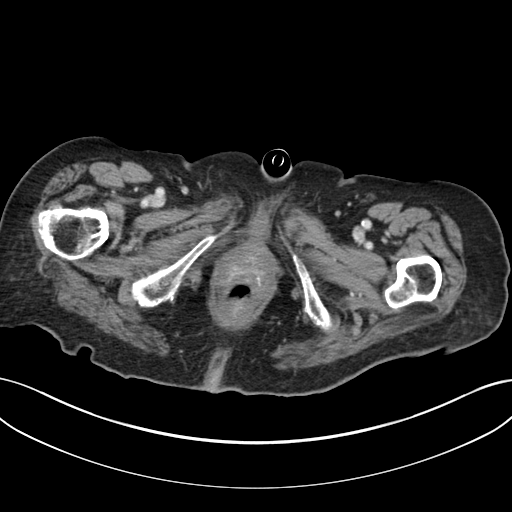
[im 6/89  bone]
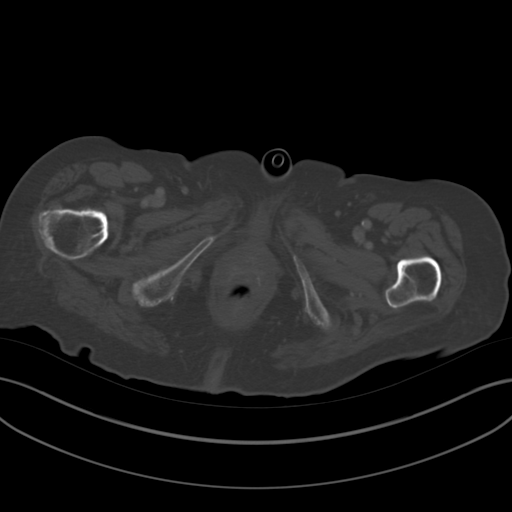
[im 12/89  soft-tissue]
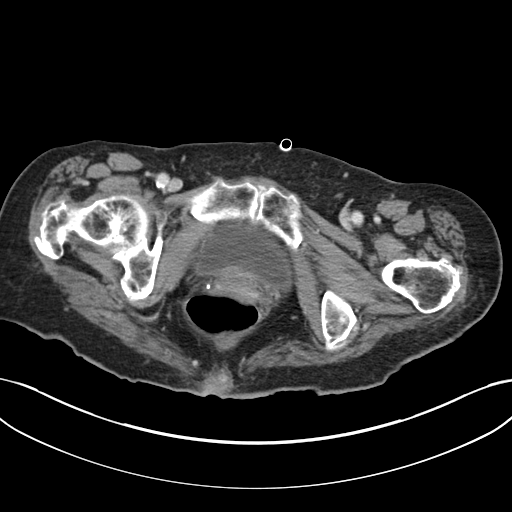
[im 17/89  soft-tissue]
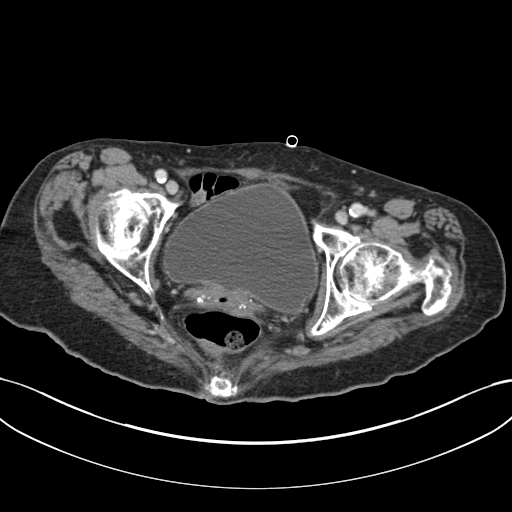
[im 23/89  soft-tissue]
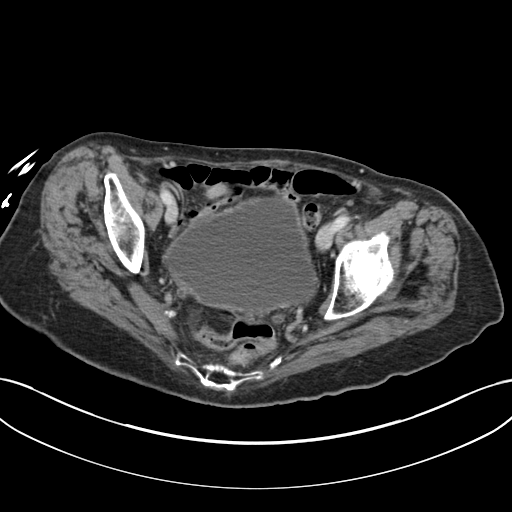
[im 28/89  soft-tissue]
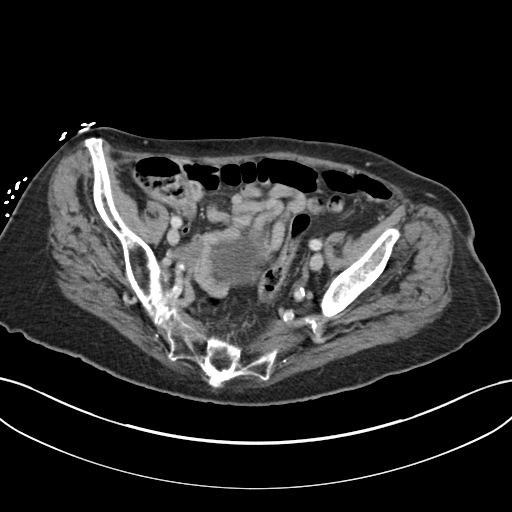
[im 34/89  soft-tissue]
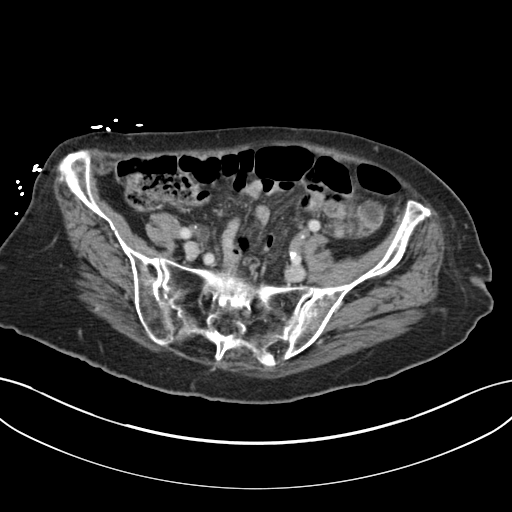
[im 39/89  soft-tissue]
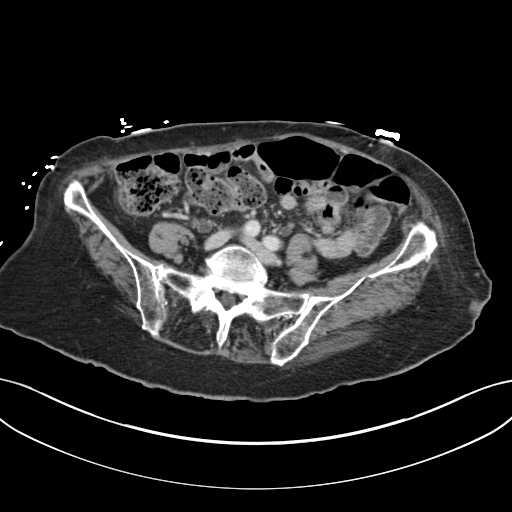
[im 50/89  soft-tissue]
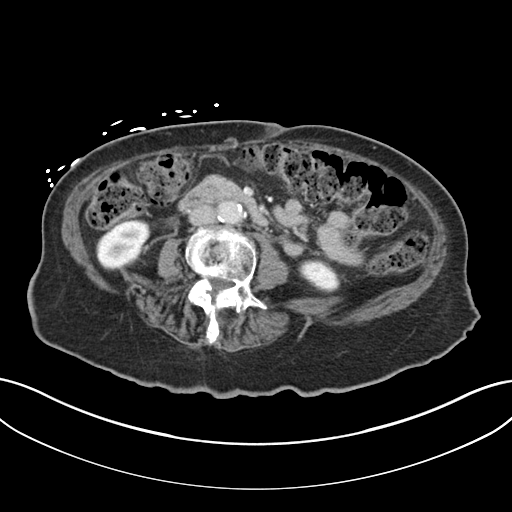
[im 56/89  soft-tissue]
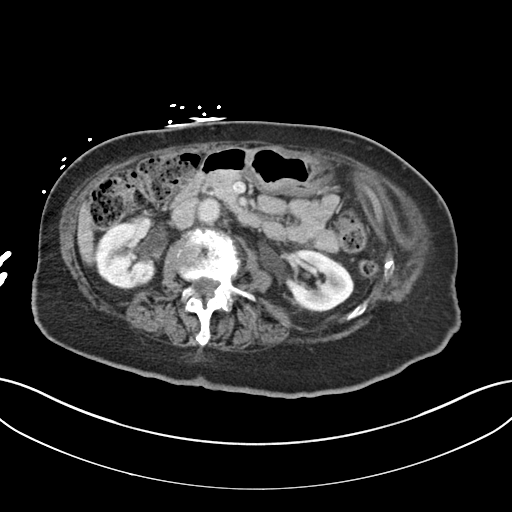
[im 56/89  bone]
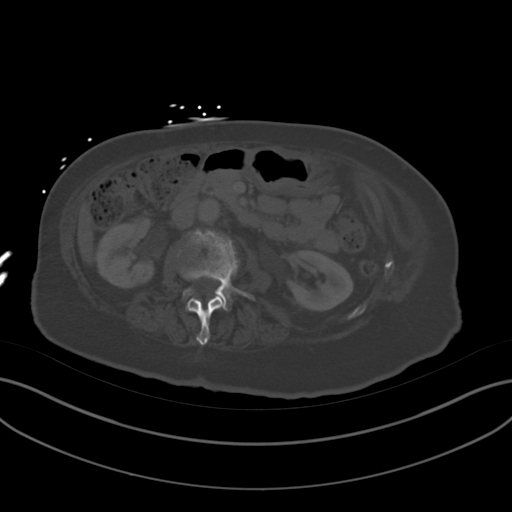
[im 61/89  soft-tissue]
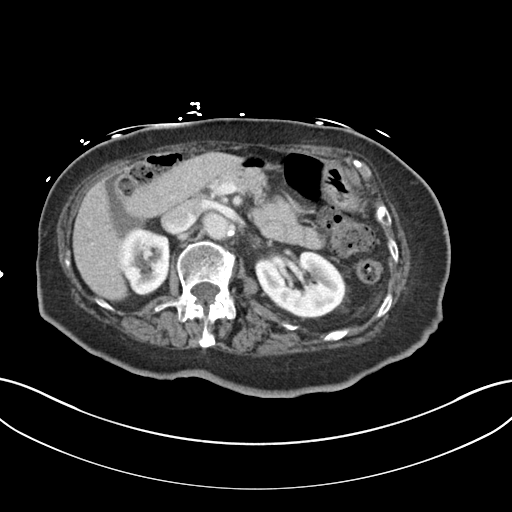
[im 67/89  soft-tissue]
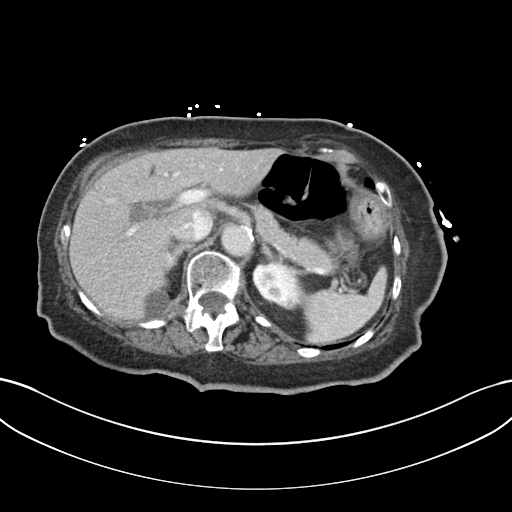
[im 72/89  soft-tissue]
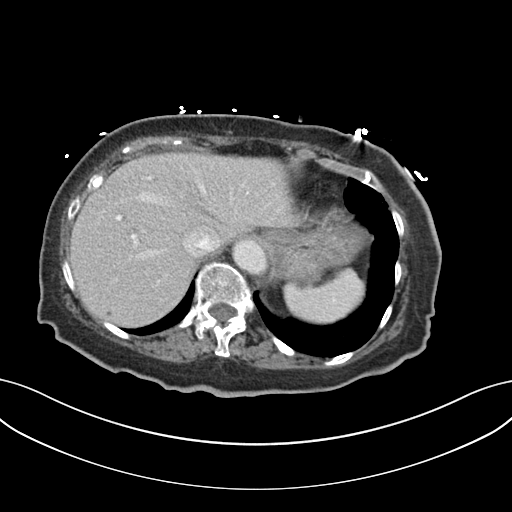
[im 78/89  soft-tissue]
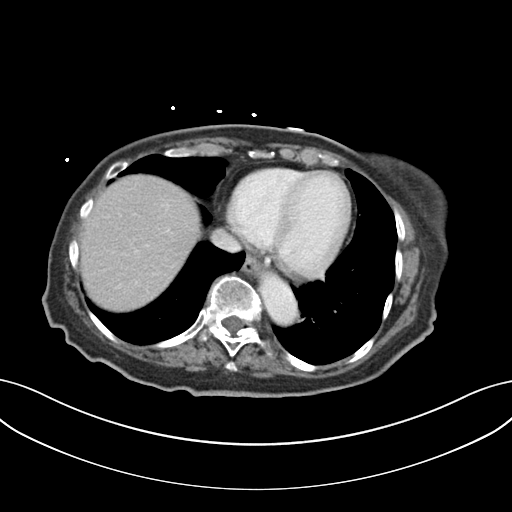
[im 83/89  soft-tissue]
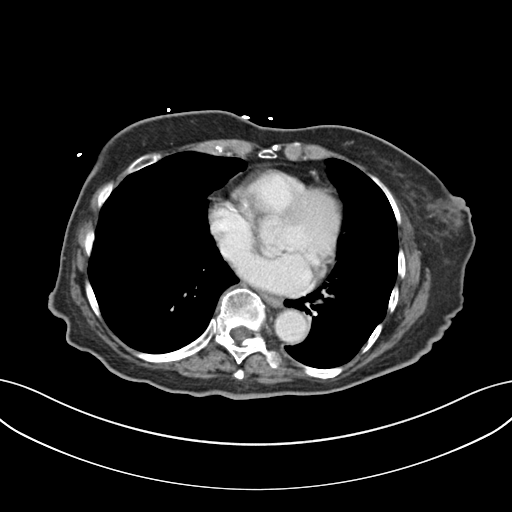

[Series 5: coronal st · coronal · 0.84mm/px · 3 of 95 slices shown]
[im 32/95  soft-tissue]
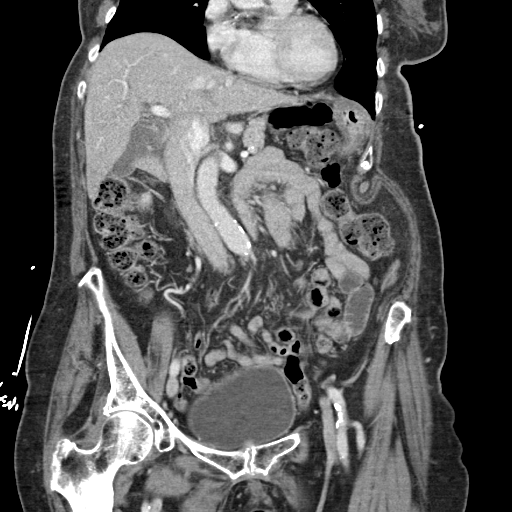
[im 42/95  soft-tissue]
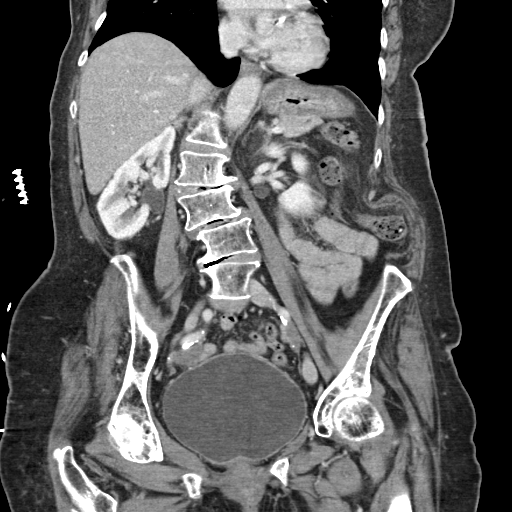
[im 53/95  soft-tissue]
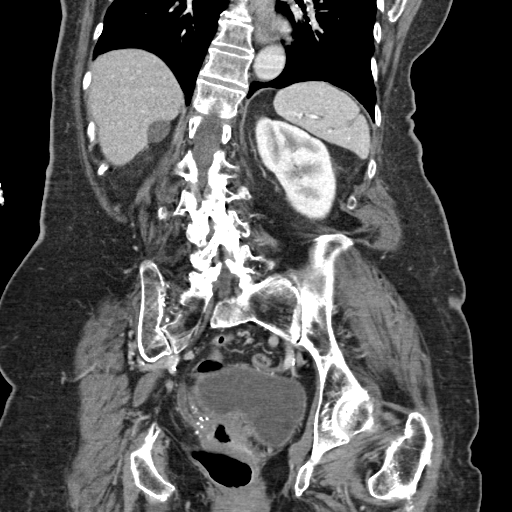

[17 of 46 positions shown; findings below may reference images not displayed]

FINDINGS: Lower chest: Scattered coronary calcifications. No pleural or
pericardial effusion. Visualized lung bases clear.

Hepatobiliary: 3cm low attenuation collection at the posterior
margin of the right hepatic lobe, previously 2.7 cm. No new liver
lesion. Gallbladder is nondistended. No biliary ductal dilatation.

Pancreas: Unremarkable. No pancreatic ductal dilatation or
surrounding inflammatory changes.

Spleen: Normal in size without focal abnormality.

Adrenals/Urinary Tract: Normal adrenal glands. Normal renal
parenchymal enhancement. No hydronephrosis. There is mild right
ureterectasis down to the ureteral orifice, without radiodense
calculus. The urinary bladder is physiologically distended.

Stomach/Bowel: Stomach is decompressed. Small bowel nondilated.
Normal appendix. Moderate proximal colonic fecal material,
decompressed distally.

Vascular/Lymphatic: Scattered aortoiliac calcified plaque without
aneurysm or stenosis. Portal vein patent. Bilateral pelvic
phleboliths. No abdominal or pelvic adenopathy.

Reproductive: Status post hysterectomy. No adnexal masses.

Other: No ascites.  No free air.

Musculoskeletal: Spondylitic changes in the lower thoracic and
lumbar spine. Thoracolumbar dextroscoliosis apex L2. Negative for
fracture or worrisome bone lesion.
IMPRESSION: 1. No acute findings.
2. Stable right ureterectasis without hydronephrosis or
urolithiasis.
3. Coronary and aortoiliac  atherosclerosis ([id number]-170.0)
